# Patient Record
Sex: Male | Born: 1940 | Race: White | Hispanic: No | Marital: Married | Smoking: Former smoker
Health system: Southern US, Community
[De-identification: ages and names within clinical notes are randomized; demographics above are authoritative.]

## PROBLEM LIST (undated history)

## (undated) DIAGNOSIS — A419 Sepsis, unspecified organism: Secondary | ICD-10-CM

## (undated) DIAGNOSIS — I509 Heart failure, unspecified: Secondary | ICD-10-CM

## (undated) DIAGNOSIS — R569 Unspecified convulsions: Secondary | ICD-10-CM

## (undated) DIAGNOSIS — I10 Essential (primary) hypertension: Secondary | ICD-10-CM

## (undated) DIAGNOSIS — K219 Gastro-esophageal reflux disease without esophagitis: Secondary | ICD-10-CM

## (undated) DIAGNOSIS — F419 Anxiety disorder, unspecified: Secondary | ICD-10-CM

## (undated) DIAGNOSIS — M069 Rheumatoid arthritis, unspecified: Secondary | ICD-10-CM

## (undated) DIAGNOSIS — M359 Systemic involvement of connective tissue, unspecified: Secondary | ICD-10-CM

## (undated) DIAGNOSIS — D649 Anemia, unspecified: Secondary | ICD-10-CM

## (undated) DIAGNOSIS — T7840XA Allergy, unspecified, initial encounter: Secondary | ICD-10-CM

## (undated) DIAGNOSIS — M1990 Unspecified osteoarthritis, unspecified site: Secondary | ICD-10-CM

## (undated) DIAGNOSIS — F32A Depression, unspecified: Secondary | ICD-10-CM

## (undated) DIAGNOSIS — E119 Type 2 diabetes mellitus without complications: Secondary | ICD-10-CM

## (undated) HISTORY — PX: SPINE SURGERY: SHX786

## (undated) HISTORY — DX: Sepsis, unspecified organism: A41.9

## (undated) HISTORY — PX: JOINT REPLACEMENT: SHX530

## (undated) HISTORY — PX: BACK SURGERY: SHX140

## (undated) HISTORY — PX: HERNIA REPAIR: SHX51

## (undated) HISTORY — DX: Allergy, unspecified, initial encounter: T78.40XA

## (undated) HISTORY — PX: OTHER SURGICAL HISTORY: SHX169

## (undated) HISTORY — PX: CARPAL TUNNEL RELEASE: SHX101

---

## 2007-07-01 ENCOUNTER — Ambulatory Visit: Payer: Self-pay | Admitting: General Practice

## 2007-07-01 ENCOUNTER — Other Ambulatory Visit: Payer: Self-pay

## 2007-07-17 ENCOUNTER — Inpatient Hospital Stay: Payer: Self-pay | Admitting: General Practice

## 2007-10-09 ENCOUNTER — Ambulatory Visit: Payer: Self-pay | Admitting: General Practice

## 2007-10-10 ENCOUNTER — Ambulatory Visit: Payer: Self-pay | Admitting: General Practice

## 2007-10-24 ENCOUNTER — Inpatient Hospital Stay: Payer: Self-pay | Admitting: General Practice

## 2008-09-11 ENCOUNTER — Emergency Department: Payer: Self-pay | Admitting: Emergency Medicine

## 2010-06-14 ENCOUNTER — Ambulatory Visit: Payer: Self-pay | Admitting: Gastroenterology

## 2010-11-13 ENCOUNTER — Ambulatory Visit: Payer: Self-pay | Admitting: Surgery

## 2010-11-14 ENCOUNTER — Ambulatory Visit: Payer: Self-pay | Admitting: Surgery

## 2013-05-21 DIAGNOSIS — M549 Dorsalgia, unspecified: Secondary | ICD-10-CM | POA: Insufficient documentation

## 2013-05-21 DIAGNOSIS — I447 Left bundle-branch block, unspecified: Secondary | ICD-10-CM | POA: Insufficient documentation

## 2013-06-12 ENCOUNTER — Ambulatory Visit: Payer: Self-pay | Admitting: Medical

## 2013-06-16 ENCOUNTER — Ambulatory Visit: Payer: Self-pay | Admitting: Specialist

## 2013-11-13 DIAGNOSIS — G8929 Other chronic pain: Secondary | ICD-10-CM | POA: Insufficient documentation

## 2013-11-13 DIAGNOSIS — I10 Essential (primary) hypertension: Secondary | ICD-10-CM | POA: Insufficient documentation

## 2013-11-13 DIAGNOSIS — M5441 Lumbago with sciatica, right side: Secondary | ICD-10-CM

## 2013-11-13 DIAGNOSIS — R569 Unspecified convulsions: Secondary | ICD-10-CM | POA: Insufficient documentation

## 2013-11-13 DIAGNOSIS — N419 Inflammatory disease of prostate, unspecified: Secondary | ICD-10-CM | POA: Insufficient documentation

## 2013-11-13 DIAGNOSIS — M5442 Lumbago with sciatica, left side: Secondary | ICD-10-CM

## 2013-12-17 DIAGNOSIS — E119 Type 2 diabetes mellitus without complications: Secondary | ICD-10-CM | POA: Insufficient documentation

## 2013-12-17 DIAGNOSIS — M069 Rheumatoid arthritis, unspecified: Secondary | ICD-10-CM | POA: Insufficient documentation

## 2015-05-23 ENCOUNTER — Encounter: Payer: Self-pay | Admitting: Internal Medicine

## 2015-05-23 ENCOUNTER — Emergency Department: Payer: Medicare Other

## 2015-05-23 ENCOUNTER — Inpatient Hospital Stay
Admission: EM | Admit: 2015-05-23 | Discharge: 2015-05-26 | DRG: 872 | Disposition: A | Discharge status: Home / Self Care | Payer: Medicare Other | Attending: Internal Medicine | Admitting: Internal Medicine

## 2015-05-23 DIAGNOSIS — M069 Rheumatoid arthritis, unspecified: Secondary | ICD-10-CM | POA: Diagnosis present

## 2015-05-23 DIAGNOSIS — G40909 Epilepsy, unspecified, not intractable, without status epilepticus: Secondary | ICD-10-CM | POA: Diagnosis present

## 2015-05-23 DIAGNOSIS — A419 Sepsis, unspecified organism: Secondary | ICD-10-CM | POA: Diagnosis present

## 2015-05-23 DIAGNOSIS — Z8249 Family history of ischemic heart disease and other diseases of the circulatory system: Secondary | ICD-10-CM | POA: Diagnosis not present

## 2015-05-23 DIAGNOSIS — I10 Essential (primary) hypertension: Secondary | ICD-10-CM | POA: Diagnosis present

## 2015-05-23 DIAGNOSIS — Z79899 Other long term (current) drug therapy: Secondary | ICD-10-CM

## 2015-05-23 DIAGNOSIS — R4182 Altered mental status, unspecified: Secondary | ICD-10-CM | POA: Diagnosis present

## 2015-05-23 DIAGNOSIS — J209 Acute bronchitis, unspecified: Secondary | ICD-10-CM | POA: Diagnosis present

## 2015-05-23 DIAGNOSIS — I959 Hypotension, unspecified: Secondary | ICD-10-CM | POA: Diagnosis present

## 2015-05-23 DIAGNOSIS — K219 Gastro-esophageal reflux disease without esophagitis: Secondary | ICD-10-CM | POA: Diagnosis present

## 2015-05-23 DIAGNOSIS — Z7982 Long term (current) use of aspirin: Secondary | ICD-10-CM

## 2015-05-23 HISTORY — DX: Rheumatoid arthritis, unspecified: M06.9

## 2015-05-23 HISTORY — DX: Essential (primary) hypertension: I10

## 2015-05-23 HISTORY — DX: Unspecified osteoarthritis, unspecified site: M19.90

## 2015-05-23 HISTORY — DX: Sepsis, unspecified organism: A41.9

## 2015-05-23 HISTORY — DX: Unspecified convulsions: R56.9

## 2015-05-23 LAB — BASIC METABOLIC PANEL
Anion gap: 8 (ref 5–15)
BUN: 22 mg/dL — ABNORMAL HIGH (ref 6–20)
CHLORIDE: 94 mmol/L — AB (ref 101–111)
CO2: 28 mmol/L (ref 22–32)
Calcium: 9 mg/dL (ref 8.9–10.3)
Creatinine, Ser: 1.08 mg/dL (ref 0.61–1.24)
GFR calc Af Amer: >60 mL/min (ref >60)
GFR calc non Af Amer: >60 mL/min (ref >60)
GLUCOSE: 95 mg/dL (ref 65–99)
Potassium: 3.2 mmol/L — ABNORMAL LOW (ref 3.5–5.1)
Sodium: 130 mmol/L — ABNORMAL LOW (ref 135–145)

## 2015-05-23 LAB — URINALYSIS COMPLETE WITH MICROSCOPIC (ARMC ONLY)
BACTERIA UA: NONE SEEN
Bilirubin Urine: NEGATIVE
Glucose, UA: NEGATIVE mg/dL
HGB URINE DIPSTICK: NEGATIVE
Ketones, ur: NEGATIVE mg/dL
Nitrite: NEGATIVE
Protein, ur: NEGATIVE mg/dL
SPECIFIC GRAVITY, URINE: 1.01 (ref 1.005–1.030)
Squamous Epithelial / LPF: NONE SEEN
pH: 7 (ref 5.0–8.0)

## 2015-05-23 LAB — CBC WITH DIFFERENTIAL/PLATELET
BASOS ABS: 0 10*3/uL (ref 0–0.1)
Basophils Relative: 0 %
Eosinophils Absolute: 0 10*3/uL (ref 0–0.7)
Eosinophils Relative: 0 %
HEMATOCRIT: 37.3 % — AB (ref 40.0–52.0)
HEMOGLOBIN: 12.6 g/dL — AB (ref 13.0–18.0)
LYMPHS PCT: 1 %
Lymphs Abs: 0.2 10*3/uL — ABNORMAL LOW (ref 1.0–3.6)
MCH: 28.6 pg (ref 26.0–34.0)
MCHC: 33.7 g/dL (ref 32.0–36.0)
MCV: 84.8 fL (ref 80.0–100.0)
MONO ABS: 1 10*3/uL (ref 0.2–1.0)
Monocytes Relative: 7 %
NEUTROS ABS: 13.7 10*3/uL — AB (ref 1.4–6.5)
Neutrophils Relative %: 92 %
Platelets: 179 10*3/uL (ref 150–440)
RBC: 4.39 MIL/uL — ABNORMAL LOW (ref 4.40–5.90)
RDW: 16.2 % — AB (ref 11.5–14.5)
WBC: 14.9 10*3/uL — ABNORMAL HIGH (ref 3.8–10.6)

## 2015-05-23 LAB — LACTIC ACID, PLASMA: Lactic Acid, Venous: 1.2 mmol/L (ref 0.5–2.0)

## 2015-05-23 LAB — TROPONIN I: Troponin I: <0.03 ng/mL (ref <0.031)

## 2015-05-23 MED ORDER — SODIUM CHLORIDE 0.9 % IV BOLUS (SEPSIS)
1000.0000 mL | Freq: Once | INTRAVENOUS | Status: AC
  Administered 2015-05-23: 1000 mL via INTRAVENOUS

## 2015-05-23 MED ORDER — ACETAMINOPHEN 650 MG RE SUPP: 650.0000 mg | Freq: Four times a day (QID) | RECTAL | Status: DC | PRN

## 2015-05-23 MED ORDER — MORPHINE SULFATE (PF) 2 MG/ML IV SOLN: 2.0000 mg | INTRAVENOUS | Status: DC | PRN

## 2015-05-23 MED ORDER — DEXTROSE 5 % IV SOLN
2.0000 g | Freq: Three times a day (TID) | INTRAVENOUS | Status: DC
  Administered 2015-05-23 – 2015-05-25 (×6): 2 g via INTRAVENOUS
  Filled 2015-05-23 (×10): qty 2

## 2015-05-23 MED ORDER — ONDANSETRON HCL 4 MG/2ML IJ SOLN: 4.0000 mg | Freq: Four times a day (QID) | INTRAMUSCULAR | Status: DC | PRN

## 2015-05-23 MED ORDER — HEPARIN SODIUM (PORCINE) 5000 UNIT/ML IJ SOLN
5000.0000 [IU] | Freq: Three times a day (TID) | INTRAMUSCULAR | Status: DC
Start: 2015-05-23 — End: 2015-05-26
  Administered 2015-05-24 – 2015-05-26 (×7): 5000 [IU] via SUBCUTANEOUS
  Filled 2015-05-23 (×7): qty 1

## 2015-05-23 MED ORDER — OXYCODONE HCL 5 MG PO TABS
5.0000 mg | ORAL_TABLET | ORAL | Status: DC | PRN
  Administered 2015-05-24 (×2): 5 mg via ORAL
  Filled 2015-05-23 (×2): qty 1

## 2015-05-23 MED ORDER — SODIUM CHLORIDE 0.9 % IJ SOLN
3.0000 mL | Freq: Two times a day (BID) | INTRAMUSCULAR | Status: DC
  Administered 2015-05-24 – 2015-05-26 (×3): 3 mL via INTRAVENOUS

## 2015-05-23 MED ORDER — SODIUM CHLORIDE 0.9 % IV SOLN
INTRAVENOUS | Status: DC
  Administered 2015-05-24 – 2015-05-25 (×4): via INTRAVENOUS

## 2015-05-23 MED ORDER — PIPERACILLIN-TAZOBACTAM 3.375 G IVPB
3.3750 g | Freq: Once | INTRAVENOUS | Status: DC
  Filled 2015-05-23: qty 50

## 2015-05-23 MED ORDER — LEVOFLOXACIN IN D5W 750 MG/150ML IV SOLN
750.0000 mg | Freq: Once | INTRAVENOUS | Status: AC
  Administered 2015-05-23: 750 mg via INTRAVENOUS
  Filled 2015-05-23: qty 150

## 2015-05-23 MED ORDER — ONDANSETRON HCL 4 MG PO TABS: 4.0000 mg | ORAL_TABLET | Freq: Four times a day (QID) | ORAL | Status: DC | PRN

## 2015-05-23 MED ORDER — ACETAMINOPHEN 325 MG PO TABS
650.0000 mg | ORAL_TABLET | Freq: Four times a day (QID) | ORAL | Status: DC | PRN
  Administered 2015-05-23: 650 mg via ORAL
  Filled 2015-05-23: qty 2

## 2015-05-23 MED ORDER — VANCOMYCIN HCL IN DEXTROSE 1-5 GM/200ML-% IV SOLN
1000.0000 mg | Freq: Once | INTRAVENOUS | Status: AC
  Administered 2015-05-23: 1000 mg via INTRAVENOUS
  Filled 2015-05-23: qty 200

## 2015-05-23 NOTE — ED Provider Notes (Signed)
[facility] Emergency Department Provider Note    ____________________________________________  Time seen: 2040  I have reviewed the triage vital signs and the nursing notes.   HISTORY  Chief Complaint Altered Mental Status and Weakness   History limited by: Altered Mental Status, some history obtained from family   HPI Jeremiah [name]. is a 75 y.o. male who presents to the emergency department today because of concerns for altered mental status. Patient himself does state that he felt confused this afternoon. Family states that this morning he seemed to be in his normal state of health. His son then went shopping with him and noted a couple episodes of some odd comments. The wife states that when he got home he seemed to be quite confused. He was answering questions appropriately. She also thought he seemed quite weak and was having a hard time walking. The patient himself denies any chest pain, shortness breath or cough. Denies any abdominal pain nausea vomiting or diarrhea. Denies any dysuria or foul odor to his urine.   Past Medical History  Diagnosis Date  . Hypertension   . Arthritis   . Seizures ([place])   . Rheumatoid arthritis [facility])     Patient Active Problem List   Diagnosis Date Noted  . Sepsis ([place]) 05/23/2015    History reviewed. No pertinent past surgical history.  Current Outpatient Rx  Name  Route  Sig  Dispense  Refill  . furosemide (LASIX) 40 MG tablet   Oral   Take 40 mg by mouth daily.         Marland Kitchen gabapentin (NEURONTIN) 300 MG capsule   Oral   Take 300 mg by mouth 2 (two) times daily.         . hydrochlorothiazide (HYDRODIURIL) 25 MG tablet   Oral   Take 25 mg by mouth daily.         . hydroxychloroquine (PLAQUENIL) 200 MG tablet   Oral   Take 400 mg by mouth at bedtime.         . lamoTRIgine (LAMICTAL) 200 MG tablet   Oral   Take 200 mg by mouth 2 (two) times daily.         Marland Kitchen losartan-hydrochlorothiazide  (HYZAAR) 50-12.5 MG tablet   Oral   Take 1 tablet by mouth daily.         . methotrexate (RHEUMATREX) 2.5 MG tablet   Oral   Take 25 mg by mouth once a week.          . traZODone (DESYREL) 50 MG tablet   Oral   Take 50 mg by mouth at bedtime.           Allergies Cefoperazone; Penicillins; and Latex  Family History  Problem Relation Age of Onset  . Hypertension Other     Social History Social History  Substance Use Topics  . Smoking status: Never Smoker   . Smokeless tobacco: None  . Alcohol Use: No    Review of Systems  Constitutional: Negative for fever. Cardiovascular: Negative for chest pain. Respiratory: Negative for shortness of breath. Gastrointestinal: Negative for abdominal pain, vomiting and diarrhea. Neurological: Negative for headaches, focal weakness or numbness.  10-point ROS otherwise negative.  ____________________________________________   PHYSICAL EXAM:  VITAL SIGNS: ED Triage Vitals  Enc Vitals Group     BP 05/23/15 1945 110/77 mmHg     Pulse Rate 05/23/15 1945 116     Resp 05/23/15 1945 18     Temp  05/23/15 1945 99.2 F (37.3 C)     Temp Source 05/23/15 1945 Oral     SpO2 05/23/15 1945 99 %     Weight 05/23/15 1945 229 lb 8 oz (104.101 kg)     Height 05/23/15 1945 6\' 1"  (1.854 m)     Head Cir --      Peak Flow --      Pain Score 05/23/15 1949 0   Constitutional: Alert and oriented. Well appearing and in no distress. Eyes: Conjunctivae are normal. PERRL. Normal extraocular movements. ENT   Head: Normocephalic and atraumatic.   Nose: No congestion/rhinnorhea.   Mouth/Throat: Mucous membranes are moist.   Neck: No stridor. Hematological/Lymphatic/Immunilogical: No cervical lymphadenopathy. Cardiovascular: Normal rate, regular rhythm.  No murmurs, rubs, or gallops. Respiratory: Normal respiratory effort without tachypnea nor retractions. Breath sounds are clear and equal bilaterally. No  wheezes/rales/rhonchi. Gastrointestinal: Soft and nontender. No distention. There is no CVA tenderness. Genitourinary: Deferred Musculoskeletal: Normal range of motion in all extremities. No joint effusions.  No lower extremity tenderness nor edema. Neurologic:  Slight difficulty with speech however can give history. Moves all extremities.  Skin:  Skin is warm, dry and intact. No rash noted. Psychiatric: Mood and affect are normal. Speech and behavior are normal. Patient exhibits appropriate insight and judgment.  ____________________________________________    LABS (pertinent positives/negatives)  Trop <0.03 Na 130 K 3.2 Cr 1.08 WBC 14.9 Hgb 12.6 Lactic 1.2  ____________________________________________   EKG  None  ____________________________________________    RADIOLOGY  CT head IMPRESSION: Normal head CT for age.  CXR IMPRESSION: 1. No acute cardiopulmonary disease is radiographically apparent. 2. Thoracic spondylosis. Degenerative arthropathy of the shoulders.   ____________________________________________   PROCEDURES  Procedure(s) performed: None  Critical Care performed: Yes, see critical care note(s)  CRITICAL CARE Performed by: Nance Pear   Total critical care time: 30 minutes  Critical care time was exclusive of separately billable procedures and treating other patients.  Critical care was necessary to treat or prevent imminent or life-threatening deterioration.  Critical care was time spent personally by me on the following activities: development of treatment plan with patient and/or surrogate as well as nursing, discussions with consultants, evaluation of patient's response to treatment, examination of patient, obtaining history from patient or surrogate, ordering and performing treatments and interventions, ordering and review of laboratory studies, ordering and review of radiographic studies, pulse oximetry and re-evaluation of  patient's condition.  ____________________________________________   INITIAL IMPRESSION / ASSESSMENT AND PLAN / ED COURSE  Pertinent labs & imaging results that were available during my care of the patient were reviewed by me and considered in my medical decision making (see chart for details).  Patient presented to the emergency department today because of concerns for confusion. Patient was tachycardic and febrile when he first came. Code sepsis was called. Patient was given IV fluids and broad-spectrum antibiotics. Patient appeared to do somewhat better with fluids. Will plan on admission to the hospitalist service.  ____________________________________________   FINAL CLINICAL IMPRESSION(S) / ED DIAGNOSES  Final diagnoses:  Sepsis, due to unspecified organism ([place])  Altered mental status, unspecified altered mental status type     Nance Pear, MD 05/24/15 1400

## 2015-05-23 NOTE — H&P (Signed)
[facility] NAME: Jeremiah [name]    MR#:  [id number]  DATE OF BIRTH:  01/17/1941   DATE OF ADMISSION:  05/23/2015  PRIMARY CARE PHYSICIAN: No primary care provider on file.   REQUESTING/REFERRING PHYSICIAN: Archie Balboa  CHIEF COMPLAINT:   Chief Complaint  Patient presents with  . Altered Mental Status  . Weakness    HISTORY OF PRESENT ILLNESS:  Jeremiah [name]  is a 75 y.o. male with a known history of rheumatoid arthritis on immune modulators, essential hypertension who is presenting with altered mental status. Patient is a somewhat poor historian history of obtained from him as well as wife present at bedside. They described acute onset of shaking chills with confusion. Because of this came to the Hospital further workup and evaluatio on EMS arrival noted to be hypotensive with blood pressures in the high 70s. Denies further localizing symptoms including pain, cough   PAST MEDICAL HISTORY:   Past Medical History  Diagnosis Date  . Hypertension   . Arthritis   . Seizures ([place])   . Rheumatoid arthritis ([place])     PAST SURGICAL HISTORY:  History reviewed. No pertinent past surgical history.  SOCIAL HISTORY:   Social History  Substance Use Topics  . Smoking status: Never Smoker   . Smokeless tobacco: Not on file  . Alcohol Use: No    FAMILY HISTORY:   Family History  Problem Relation Age of Onset  . Hypertension Other     DRUG ALLERGIES:   Allergies  Allergen Reactions  . Cefoperazone Anaphylaxis  . Penicillins Anaphylaxis and Other (See Comments)    Has patient had a PCN reaction causing immediate rash, facial/tongue/throat swelling, SOB or lightheadedness with hypotension: Yes Has patient had a PCN reaction causing severe rash involving mucus membranes or skin necrosis: No Has patient had a PCN reaction that required hospitalization No Has patient had a PCN reaction occurring within the last 10 years:  No If all of the above answers are "NO", then may proceed with Cephalosporin use.  . Latex Rash    REVIEW OF SYSTEMS:  REVIEW OF SYSTEMS:  CONSTITUTIONAL: Denies fevers, positive chills, fatigue, weakness.  EYES: Denies blurred vision, double vision, or eye pain.  EARS, NOSE, THROAT: Denies tinnitus, ear pain, hearing loss.  RESPIRATORY: denies cough, shortness of breath, wheezing  CARDIOVASCULAR: Denies chest pain, palpitations, edema.  GASTROINTESTINAL: Denies nausea, vomiting, diarrhea, abdominal pain.  GENITOURINARY: Denies dysuria, hematuria.  ENDOCRINE: Denies nocturia or thyroid problems. HEMATOLOGIC AND LYMPHATIC: Denies easy bruising or bleeding.  SKIN:  small wound left heel, Denies rash or lesions.  MUSCULOSKELETAL: Denies pain in neck, back, shoulder, knees, hips, or further arthritic symptoms.  NEUROLOGIC: Denies paralysis, paresthesias.  PSYCHIATRIC: Denies anxiety or depressive symptoms. Otherwise full review of systems performed by me is negative.   MEDICATIONS AT HOME:   Prior to Admission medications   Medication Sig Start Date End Date Taking? Authorizing Provider  aspirin 81 MG chewable tablet Chew 81 mg by mouth daily.   Yes Historical Provider, MD  Cinnamon 500 MG capsule Take 500 mg by mouth 2 (two) times daily.   Yes Historical Provider, MD  diazepam (VALIUM) 5 MG tablet Take 5 mg by mouth every 6 (six) hours as needed for muscle spasms.   Yes Historical Provider, MD  etanercept (ENBREL) 50 MG/ML injection Inject 50 mg into the skin once a week. Pt uses on Friday.   Yes Historical Provider, MD  ferrous sulfate 325 (65 FE) MG tablet Take 325 mg by mouth daily with breakfast.   Yes Historical Provider, MD  folic acid (FOLVITE) Q000111Q MCG tablet Take 800 mcg by mouth daily.   Yes Historical Provider, MD  furosemide (LASIX) 40 MG tablet Take 40 mg by mouth daily.   Yes Historical Provider, MD  gabapentin (NEURONTIN) 300 MG capsule Take 300 mg by mouth 2 (two) times  daily.   Yes Historical Provider, MD  hydrochlorothiazide (HYDRODIURIL) 25 MG tablet Take 25 mg by mouth daily.   Yes Historical Provider, MD  hydroxychloroquine (PLAQUENIL) 200 MG tablet Take 400 mg by mouth at bedtime.   Yes Historical Provider, MD  lamoTRIgine (LAMICTAL) 200 MG tablet Take 200 mg by mouth 2 (two) times daily.   Yes Historical Provider, MD  losartan-hydrochlorothiazide (HYZAAR) 50-12.5 MG tablet Take 1 tablet by mouth daily.   Yes Historical Provider, MD  methotrexate (RHEUMATREX) 2.5 MG tablet Take 25 mg by mouth once a week. Pt takes on Monday.   Yes Historical Provider, MD  omeprazole (PRILOSEC) 20 MG capsule Take 40 mg by mouth daily.   Yes Historical Provider, MD  potassium chloride SA (K-DUR,KLOR-CON) 20 MEQ tablet Take 20 mEq by mouth daily.   Yes Historical Provider, MD  traZODone (DESYREL) 50 MG tablet Take 50 mg by mouth at bedtime.   Yes Historical Provider, MD      VITAL SIGNS:  Blood pressure 118/60, pulse 111, temperature 101.1 F (38.4 C), temperature source Rectal, resp. rate 19, height 6\' 1"  (1.854 m), weight 229 lb 8 oz (104.101 kg), SpO2 99 %.  PHYSICAL EXAMINATION:  VITAL SIGNS: Filed Vitals:   05/23/15 2145 05/23/15 2229  BP: 118/60   Pulse: 111   Temp:  101.1 F (38.4 C)  Resp: 35    GENERAL:74 y.o.male currently in no acute distress.  HEAD: Normocephalic, atraumatic.  EYES: Pupils equal, round, reactive to light. Extraocular muscles intact. No scleral icterus.  MOUTH: Moist mucosal membrane. Dentition intact. No abscess noted.  EAR, NOSE, THROAT: Clear without exudates. No external lesions.  NECK: Supple. No thyromegaly. No nodules. No JVD.  PULMONARY: Clear to ascultation, without wheeze rails or rhonci. No use of accessory muscles, Good respiratory effort. good air entry bilaterally CHEST: Nontender to palpation.  CARDIOVASCULAR: S1 and S2. tachycardic No murmurs, rubs, or gallops. No edema. Pedal pulses 2+ bilaterally.  GASTROINTESTINAL:  Soft, nontender, nondistended. No masses. Positive bowel sounds. No hepatosplenomegaly.  MUSCULOSKELETAL: No swelling, clubbing, or edema. Range of motion full in all extremities.  NEUROLOGIC: Cranial nerves II through XII are intact. No gross focal neurological deficits. Sensation intact. Reflexes intact.  SKIN:  left lower extremity erythema warm to touch from heel to mid calf, small linear ulceration left heel without any discharge No further  ulceration, lesions, rashes, or cyanosis. Skin warm and dry. Turgor intact.  PSYCHIATRIC: Mood, affect blunted . The patient is awake, alert and oriented x 3. Insight, judgment intact.    LABORATORY PANEL:   CBC  Recent Labs Lab 05/23/15 2000  WBC 14.9*  HGB 12.6*  HCT 37.3*  PLT 179   ------------------------------------------------------------------------------------------------------------------  Chemistries   Recent Labs Lab 05/23/15 2000  NA 130*  K 3.2*  CL 94*  CO2 28  GLUCOSE 95  BUN 22*  CREATININE 1.08  CALCIUM 9.0   ------------------------------------------------------------------------------------------------------------------  Cardiac Enzymes  Recent Labs Lab 05/23/15 2000  TROPONINI <0.03   ------------------------------------------------------------------------------------------------------------------  RADIOLOGY:  Ct Head Wo Contrast  05/23/2015  CLINICAL DATA:  Altered mental status.  Weakness.  Hypotension. EXAM: CT HEAD WITHOUT CONTRAST TECHNIQUE: Contiguous axial images were obtained from the base of the skull through the vertex without intravenous contrast. COMPARISON:  Report of 04/23/2003. FINDINGS: Sinuses/Soft tissues: Clear paranasal sinuses and mastoid air cells. Intracranial: No mass lesion, hemorrhage, hydrocephalus, acute infarct, intra-axial, or extra-axial fluid collection. IMPRESSION: Normal head CT for age. Electronically Signed   By: Abigail Miyamoto M.D.   On: 05/23/2015 20:26   Dg Chest  Portable 1 View  05/23/2015  CLINICAL DATA:  Altered mental status. Weakness. Hypotension. Slurred speech. EXAM: PORTABLE CHEST 1 VIEW COMPARISON:  Report from 05/31/2004 FINDINGS: The lungs appear clear.  Cardiac and mediastinal contours normal. No pleural effusion identified. Thoracic spondylosis.  Degenerative arthropathy of both shoulders. IMPRESSION: 1. No acute cardiopulmonary disease is radiographically apparent. 2. Thoracic spondylosis.  Degenerative arthropathy of the shoulders. Electronically Signed   By: Van Clines M.D.   On: 05/23/2015 21:54    EKG:   Orders placed or performed in visit on 07/01/07  . EKG 12-Lead    IMPRESSION AND PLAN:   75 year old Caucasian gentleman history of rheumatoid arthritis is presenting with fevers chills MENTAL status  1.Sepsis, meeting septic criteria by temperature, leukocytosis, heart rate present on arrival. Source unknown etiology  Panculture. Broad-spectrum antibiotics inclvancomycin/Levaquin/aztreonam and taper antibiotics when culture data returns.  Continue IV fluid hydration to keep mean arterial pressure greater than 65. He may require pressor therapy if blood pressure worsens. We will repeat lactic acid if the initial is greater than 2.2.   2. Essential hypertension: Given relative hypotension hold all medications 3. Seizure disorder: Lamictal 4. GERD without esophagitis: PPI therapy 5. Venous thromboembolism prophylactic: Heparin subcutaneous    All the records are reviewed and case discussed with ED provider. Management plans discussed with the patient, family and they are in agreement.  CODE STATfull   TOTAL TIME TAKING CARE OF THIS PATIENT: 45 minutes.    Merian Wroe,  Karenann Cai.D on 05/23/2015 at 10:42 PM  Between 7am to 6pm - Pager - [phone]  After 6pm: House Pager: - [phone]  [facility]  Office  [phone]  CC: Primary care physician; No primary care provider on file.

## 2015-05-23 NOTE — ED Notes (Signed)
Patient brought in by [facility] from home for complaints of AMS, weakness, and hypotension. Per EMS patient was out with his son around 5pm when patients son noticed that he was slurring his speech and was experiencing weakness. Per EMS when they arrived patients. BP was 78/42, patient was given approximately 400cc of fluid and BP came up to 105/55. CBG 110.

## 2015-05-23 NOTE — ED Notes (Signed)
Per patient wife they were at home this afternoon when she noticed that he had difficulty getting up out of his car. Patient wife also stated that patient was confused, he tried to answer the phone but was holding the phone upside down. Patient denies any recent illness or signs of infection.

## 2015-05-24 LAB — LACTIC ACID, PLASMA: Lactic Acid, Venous: 1.2 mmol/L (ref 0.5–2.0)

## 2015-05-24 MED ORDER — SODIUM CHLORIDE 0.9 % IV SOLN
1250.0000 mg | Freq: Two times a day (BID) | INTRAVENOUS | Status: DC
  Administered 2015-05-24 – 2015-05-25 (×3): 1250 mg via INTRAVENOUS
  Filled 2015-05-24 (×6): qty 1250

## 2015-05-24 MED ORDER — ASPIRIN 81 MG PO CHEW
81.0000 mg | CHEWABLE_TABLET | Freq: Every day | ORAL | Status: DC
  Administered 2015-05-24 – 2015-05-26 (×3): 81 mg via ORAL
  Filled 2015-05-24 (×3): qty 1

## 2015-05-24 MED ORDER — DIAZEPAM 5 MG PO TABS
5.0000 mg | ORAL_TABLET | Freq: Four times a day (QID) | ORAL | Status: DC | PRN
  Administered 2015-05-25: 5 mg via ORAL
  Filled 2015-05-24: qty 1

## 2015-05-24 MED ORDER — GABAPENTIN 300 MG PO CAPS
300.0000 mg | ORAL_CAPSULE | Freq: Two times a day (BID) | ORAL | Status: DC
  Administered 2015-05-24 – 2015-05-26 (×6): 300 mg via ORAL
  Filled 2015-05-24 (×6): qty 1

## 2015-05-24 MED ORDER — PANTOPRAZOLE SODIUM 40 MG PO TBEC
40.0000 mg | DELAYED_RELEASE_TABLET | Freq: Every day | ORAL | Status: DC
  Administered 2015-05-24 – 2015-05-26 (×3): 40 mg via ORAL
  Filled 2015-05-24 (×3): qty 1

## 2015-05-24 MED ORDER — FOLIC ACID 1 MG PO TABS
1000.0000 ug | ORAL_TABLET | Freq: Every day | ORAL | Status: DC
  Administered 2015-05-24 – 2015-05-26 (×3): 1 mg via ORAL
  Filled 2015-05-24 (×3): qty 1

## 2015-05-24 MED ORDER — TRAZODONE HCL 50 MG PO TABS
50.0000 mg | ORAL_TABLET | Freq: Every day | ORAL | Status: DC
  Administered 2015-05-24 – 2015-05-25 (×3): 50 mg via ORAL
  Filled 2015-05-24 (×3): qty 1

## 2015-05-24 MED ORDER — HYDROXYCHLOROQUINE SULFATE 200 MG PO TABS
400.0000 mg | ORAL_TABLET | Freq: Every day | ORAL | Status: DC
  Administered 2015-05-24 – 2015-05-25 (×3): 400 mg via ORAL
  Filled 2015-05-24 (×4): qty 2

## 2015-05-24 MED ORDER — LAMOTRIGINE 100 MG PO TABS
200.0000 mg | ORAL_TABLET | Freq: Two times a day (BID) | ORAL | Status: DC
  Administered 2015-05-24 – 2015-05-26 (×6): 200 mg via ORAL
  Filled 2015-05-24 (×6): qty 2

## 2015-05-24 MED ORDER — CINNAMON 500 MG PO CAPS: 500.0000 mg | ORAL_CAPSULE | Freq: Two times a day (BID) | ORAL | Status: DC

## 2015-05-24 MED ORDER — LEVOFLOXACIN IN D5W 750 MG/150ML IV SOLN
750.0000 mg | INTRAVENOUS | Status: DC
  Administered 2015-05-24 – 2015-05-25 (×2): 750 mg via INTRAVENOUS
  Filled 2015-05-24 (×3): qty 150

## 2015-05-24 NOTE — Progress Notes (Signed)
PHARMACIST - PHYSICIAN ORDER COMMUNICATION  CONCERNING: P&T Medication Policy on Herbal Medications  DESCRIPTION:  This patient's order for:  Cinnamon bark  has been noted.  This product(s) is classified as an "herbal" or natural product. Due to a lack of definitive safety studies or FDA approval, nonstandard manufacturing practices, plus the potential risk of unknown drug-drug interactions while on inpatient medications, the Pharmacy and Therapeutics Committee does not permit the use of "herbal" or natural products of this type within [facility].   ACTION TAKEN: The pharmacy department is unable to verify this order at this time. Please reevaluate patient's clinical condition at discharge and address if the herbal or natural product(s) should be resumed at that time.

## 2015-05-24 NOTE — Care Management Note (Signed)
Case Management Note  Patient Details  Name: Kreig Docken. MRN: [id number] Date of Birth: 10-27-1940  Subjective/Objective:          Patient admitted from home with diagnosis of sepsis.  Patient A&O and lives at home with wife.  Patient independent of ADL's  Patient obtains his medications form [facility] in [place].  Patient has 3 adult sons who live locally.  Patient states that he has a rolling walker, and BSC in the home.  Patient states that he does not have need for this equipment.  Patient states that he uses SCD's in the evening, and a walking stick for ambulation.  Patient does not have any current home health services but was open to [place] 2 years ago.  No RNCM needs identified.            Action/Plan:   Expected Discharge Date:  05/27/15               Expected Discharge Plan:     In-House Referral:     Discharge planning Services     Post Acute Care Choice:    Choice offered to:     DME Arranged:    DME Agency:     HH Arranged:    HH Agency:     Status of Service:     Medicare Important Message Given:    Date Medicare IM Given:    Medicare IM give by:    Date Additional Medicare IM Given:    Additional Medicare Important Message give by:     If discussed at [place] of Stay Meetings, dates discussed:    Additional Comments:  Beverly Sessions, RN 05/24/2015, 1:42 PM

## 2015-05-24 NOTE — Progress Notes (Signed)
ANTIBIOTIC CONSULT NOTE - INITIAL  Pharmacy Consult for Aztreonam/Levofloxacin/Vancomycin Indication: rule out sepsis  Allergies  Allergen Reactions  . Cefoperazone Anaphylaxis  . Penicillins Anaphylaxis and Other (See Comments)    Has patient had a PCN reaction causing immediate rash, facial/tongue/throat swelling, SOB or lightheadedness with hypotension: Yes Has patient had a PCN reaction causing severe rash involving mucus membranes or skin necrosis: No Has patient had a PCN reaction that required hospitalization No Has patient had a PCN reaction occurring within the last 10 years: No If all of the above answers are "NO", then may proceed with Cephalosporin use.  . Latex Rash    Patient Measurements: Height: 6\' 1"  (185.4 cm) Weight: 229 lb 8 oz (104.101 kg) IBW/kg (Calculated) : 79.9 Adjusted Body Weight: 90 kg  Vital Signs: Temp: 101.1 F (38.4 C) (01/02 2229) Temp Source: Rectal (01/02 2229) BP: 110/69 mmHg (01/02 2330) Pulse Rate: 105 (01/02 2330) Intake/Output from previous day:   Intake/Output from this shift:    Labs:  Recent Labs  05/23/15 2000  WBC 14.9*  HGB 12.6*  PLT 179  CREATININE 1.08   Estimated Creatinine Clearance: 77.2 mL/min (by C-G formula based on Cr of 1.08). No results for input(s): VANCOTROUGH, VANCOPEAK, VANCORANDOM, GENTTROUGH, GENTPEAK, GENTRANDOM, TOBRATROUGH, TOBRAPEAK, TOBRARND, AMIKACINPEAK, AMIKACINTROU, AMIKACIN in the last 72 hours.   Microbiology: No results found for this or any previous visit (from the past 720 hour(s)).  Medical History: Past Medical History  Diagnosis Date  . Hypertension   . Arthritis   . Seizures ([place])   . Rheumatoid arthritis ([facility])     Medications:  Infusions:  . sodium chloride     Assessment: 74 yom cc AMS/weakness. On immune modulators for RA. Poor historian, hypotensive in high 70s. Pharmacy consulted to dose antibiotics for sepsis. Penicillin allergy.  Vd 62.7 L, Ke 0.068 hr-1, T1/2  10.1 hr  Goal of Therapy:  Vancomycin trough level 15-20 mcg/ml  Plan:  Expected duration 7 days with resolution of temperature and/or normalization of WBC. Aztreonam 2 gm IV Q8H, levofloxacin 750 mg IV Q24H, and vancomycin 1 gm IV x 1 given in ED followed by 1250 mg IV Q12H starting approximately 6 hours after first dose, predicted trough 16 mcg/mL. Pharmacy will continue to follow and adjust as needed to maintain trough 15 to 20 mcg/mL.  Laural Benes, Pharm.D., BCPS Clinical Pharmacist 05/24/2015,12:10 AM

## 2015-05-24 NOTE — Plan of Care (Signed)
Problem: Fluid Volume: Goal: Hemodynamic stability will improve Outcome: Progressing Requiring iv fluids  Problem: Respiratory: Goal: Ability to maintain adequate ventilation will improve Outcome: Progressing No need for oxygen at this time, remaining on room air.

## 2015-05-24 NOTE — Progress Notes (Signed)
Patient alert and oriented, on RA  BP running low, but Dr. Margaretmary Eddy states this is ok, continue to monitor -- may be d/t pain meds.  Giving oxycodone for pain in back.

## 2015-05-24 NOTE — Progress Notes (Signed)
[place] [facility] [place] NAME: Jeremiah [name]    MR#:  [id number]  DATE OF BIRTH:  [birth year]  SUBJECTIVE:  CHIEF COMPLAINT:  Patient is resting comfortably. No overnight events. No new complaints.  REVIEW OF SYSTEMS:  CONSTITUTIONAL: No fever, fatigue or weakness.  EYES: No blurred or double vision.  EARS, NOSE, AND THROAT: No tinnitus or ear pain.  RESPIRATORY: No cough, shortness of breath, wheezing or hemoptysis.  CARDIOVASCULAR: No chest pain, orthopnea, edema.  GASTROINTESTINAL: No nausea, vomiting, diarrhea or abdominal pain.  GENITOURINARY: No dysuria, hematuria.  ENDOCRINE: No polyuria, nocturia,  HEMATOLOGY: No anemia, easy bruising or bleeding SKIN: No rash or lesion. MUSCULOSKELETAL: No joint pain or arthritis.   NEUROLOGIC: No tingling, numbness, weakness.  PSYCHIATRY: No anxiety or depression.   DRUG ALLERGIES:   Allergies  Allergen Reactions  . Cefoperazone Anaphylaxis  . Penicillins Anaphylaxis and Other (See Comments)    Has patient had a PCN reaction causing immediate rash, facial/tongue/throat swelling, SOB or lightheadedness with hypotension: Yes Has patient had a PCN reaction causing severe rash involving mucus membranes or skin necrosis: No Has patient had a PCN reaction that required hospitalization No Has patient had a PCN reaction occurring within the last 10 years: No If all of the above answers are "NO", then may proceed with Cephalosporin use.  . Latex Rash    VITALS:  Blood pressure 104/58, pulse 80, temperature 98.6 F (37 C), temperature source Oral, resp. rate 18, height 6\' 1"  (1.854 m), weight 104.101 kg (229 lb 8 oz), SpO2 99 %.  PHYSICAL EXAMINATION:  GENERAL:  75 y.o.-year-old patient lying in the bed with no acute distress.  EYES: Pupils equal, round, reactive to light and accommodation. No scleral icterus. Extraocular muscles intact.  HEENT: Head atraumatic, normocephalic. Oropharynx and  nasopharynx clear.  NECK:  Supple, no jugular venous distention. No thyroid enlargement, no tenderness.  LUNGS: Normal breath sounds bilaterally, no wheezing, rales,rhonchi or crepitation. No use of accessory muscles of respiration.  CARDIOVASCULAR: S1, S2 normal. No murmurs, rubs, or gallops.  ABDOMEN: Soft, nontender, nondistended. Bowel sounds present. No organomegaly or mass.  EXTREMITIES: No pedal edema, cyanosis, or clubbing.  NEUROLOGIC: Cranial nerves II through XII are intact. Muscle strength 5/5 in all extremities. Sensation intact. Gait not checked.  PSYCHIATRIC: The patient is alert and oriented x 3.  SKIN: No obvious rash, lesion, or ulcer.    LABORATORY PANEL:   CBC  Recent Labs Lab 05/23/15 2000  WBC 14.9*  HGB 12.6*  HCT 37.3*  PLT 179   ------------------------------------------------------------------------------------------------------------------  Chemistries   Recent Labs Lab 05/23/15 2000  NA 130*  K 3.2*  CL 94*  CO2 28  GLUCOSE 95  BUN 22*  CREATININE 1.08  CALCIUM 9.0   ------------------------------------------------------------------------------------------------------------------  Cardiac Enzymes  Recent Labs Lab 05/23/15 2000  TROPONINI <0.03   ------------------------------------------------------------------------------------------------------------------  RADIOLOGY:  Ct Head Wo Contrast  05/23/2015  CLINICAL DATA:  Altered mental status.  Weakness.  Hypotension. EXAM: CT HEAD WITHOUT CONTRAST TECHNIQUE: Contiguous axial images were obtained from the base of the skull through the vertex without intravenous contrast. COMPARISON:  Report of 04/23/2003. FINDINGS: Sinuses/Soft tissues: Clear paranasal sinuses and mastoid air cells. Intracranial: No mass lesion, hemorrhage, hydrocephalus, acute infarct, intra-axial, or extra-axial fluid collection. IMPRESSION: Normal head CT for age. Electronically Signed   By: Abigail Miyamoto M.D.   On:  05/23/2015 20:26   Dg Chest Portable 1 View  05/23/2015  CLINICAL DATA:  Altered mental status. Weakness. Hypotension. Slurred speech. EXAM: PORTABLE CHEST 1 VIEW COMPARISON:  Report from 05/31/2004 FINDINGS: The lungs appear clear.  Cardiac and mediastinal contours normal. No pleural effusion identified. Thoracic spondylosis.  Degenerative arthropathy of both shoulders. IMPRESSION: 1. No acute cardiopulmonary disease is radiographically apparent. 2. Thoracic spondylosis.  Degenerative arthropathy of the shoulders. Electronically Signed   By: Van Clines M.D.   On: 05/23/2015 21:54    EKG:   Orders placed or performed in visit on 07/01/07  . EKG 12-Lead    ASSESSMENT AND PLAN:   75 year old Caucasian gentleman history of rheumatoid arthritis is presenting with fevers chills MENTAL status  1.Sepsis, meeting septic criteria by temperature, leukocytosis, heart rate present on arrival. Source unknown etiology Panculture done, blood cultures are negative so far, urine cultures are pending. Chest x-rays negative Continue Broad-spectrum antibiotics incl vancomycin/Levaquin/aztreonam and taper antibiotics when culture data returns. Continue IV fluid hydration to keep mean arterial pressure greater than 65. We will repeat lactic acid if the initial is greater than 2.2. -Yesterday's and today's lactic acid level is at 1.2  2. Essential hypertension: Given relative hypotension hold all medications  3. Seizure disorder: Lamictal  4. GERD without esophagitis: PPI therapy  5. Venous thromboembolism prophylactic: Heparin subcutaneous      All the records are reviewed and case discussed with Care Management/Social Workerr. Management plans discussed with the patient, family and they are in agreement.  CODE STATUS: fc   TOTAL TIME TAKING CARE OF THIS PATIENT: 35  minutes.   POSSIBLE D/C IN 2-3  DAYS, DEPENDING ON CLINICAL CONDITION.   Nicholes Mango M.D on 05/24/2015 at 3:47 PM  Between  7am to 6pm - Pager - [phone] After 6pm go to [email or web address] - password [email or web address] [facility]  [facility]  Office  [phone]  CC: Primary care physician; No primary care provider on file.

## 2015-05-25 LAB — VANCOMYCIN, TROUGH: Vancomycin Tr: 11 ug/mL (ref 10–20)

## 2015-05-25 LAB — URINE CULTURE: SPECIAL REQUESTS: NORMAL

## 2015-05-25 LAB — BASIC METABOLIC PANEL
Anion gap: 4 — ABNORMAL LOW (ref 5–15)
BUN: 13 mg/dL (ref 6–20)
CALCIUM: 8.2 mg/dL — AB (ref 8.9–10.3)
CO2: 27 mmol/L (ref 22–32)
CREATININE: 0.76 mg/dL (ref 0.61–1.24)
Chloride: 107 mmol/L (ref 101–111)
GFR calc Af Amer: >60 mL/min (ref >60)
GFR calc non Af Amer: >60 mL/min (ref >60)
GLUCOSE: 97 mg/dL (ref 65–99)
Potassium: 3.2 mmol/L — ABNORMAL LOW (ref 3.5–5.1)
Sodium: 138 mmol/L (ref 135–145)

## 2015-05-25 LAB — CBC
HCT: 31.3 % — ABNORMAL LOW (ref 40.0–52.0)
HEMOGLOBIN: 10.6 g/dL — AB (ref 13.0–18.0)
MCH: 28.9 pg (ref 26.0–34.0)
MCHC: 34 g/dL (ref 32.0–36.0)
MCV: 84.9 fL (ref 80.0–100.0)
PLATELETS: 147 10*3/uL — AB (ref 150–440)
RBC: 3.68 MIL/uL — ABNORMAL LOW (ref 4.40–5.90)
RDW: 16.4 % — ABNORMAL HIGH (ref 11.5–14.5)
WBC: 5.8 10*3/uL (ref 3.8–10.6)

## 2015-05-25 LAB — MAGNESIUM: MAGNESIUM: 1.9 mg/dL (ref 1.7–2.4)

## 2015-05-25 MED ORDER — POLYETHYLENE GLYCOL 3350 17 G PO PACK
17.0000 g | PACK | Freq: Every day | ORAL | Status: DC
  Administered 2015-05-25 – 2015-05-26 (×2): 17 g via ORAL
  Filled 2015-05-25 (×2): qty 1

## 2015-05-25 NOTE — Progress Notes (Signed)
Pt requesting laxative. Dr Leslye Peer notified and willl place order

## 2015-05-25 NOTE — Care Management Important Message (Signed)
Important Message  Patient Details  Name: Jeremiah [name]. MRN: [id number] Date of Birth: Jul 24, 1940   Medicare Important Message Given:  Yes    Juliann Pulse A Nitish Roes 05/25/2015, 10:12 AM

## 2015-05-25 NOTE — Progress Notes (Signed)
[place] at [place] NAME: Jeremiah [name]    MR#:  [id number]  DATE OF BIRTH:  Aug 31, 1940  SUBJECTIVE:  CHIEF COMPLAINT:  Patient is resting comfortably. No overnight events. Productive cough   REVIEW OF SYSTEMS:  CONSTITUTIONAL: No fever, fatigue or weakness.  EYES: No blurred or double vision.  EARS, NOSE, AND THROAT: No tinnitus or ear pain.  RESPIRATORY: Reports  Cough, no  shortness of breath, wheezing or hemoptysis.  CARDIOVASCULAR: No chest pain, orthopnea, edema.  GASTROINTESTINAL: No nausea, vomiting, diarrhea or abdominal pain.  GENITOURINARY: No dysuria, hematuria.  ENDOCRINE: No polyuria, nocturia,  HEMATOLOGY: No anemia, easy bruising or bleeding SKIN: No rash or lesion. MUSCULOSKELETAL: No joint pain or arthritis.   NEUROLOGIC: No tingling, numbness, weakness.  PSYCHIATRY: No anxiety or depression.   DRUG ALLERGIES:   Allergies  Allergen Reactions  . Cefoperazone Anaphylaxis  . Penicillins Anaphylaxis and Other (See Comments)    Has patient had a PCN reaction causing immediate rash, facial/tongue/throat swelling, SOB or lightheadedness with hypotension: Yes Has patient had a PCN reaction causing severe rash involving mucus membranes or skin necrosis: No Has patient had a PCN reaction that required hospitalization No Has patient had a PCN reaction occurring within the last 10 years: No If all of the above answers are "NO", then may proceed with Cephalosporin use.  . Latex Rash    VITALS:  Blood pressure 104/55, pulse 70, temperature 98 F (36.7 C), temperature source Oral, resp. rate 17, height 6\' 1"  (1.854 m), weight 104.101 kg (229 lb 8 oz), SpO2 98 %.  PHYSICAL EXAMINATION:  GENERAL:  75 y.o.-year-old patient lying in the bed with no acute distress.  EYES: Pupils equal, round, reactive to light and accommodation. No scleral icterus. Extraocular muscles intact.  HEENT: Head atraumatic, normocephalic.  Oropharynx and nasopharynx clear.  NECK:  Supple, no jugular venous distention. No thyroid enlargement, no tenderness.  LUNGS: Bronchial breath sounds bilaterally, no wheezing, rales,rhonchi or crepitation. No use of accessory muscles of respiration.  CARDIOVASCULAR: S1, S2 normal. No murmurs, rubs, or gallops.  ABDOMEN: Soft, nontender, nondistended. Bowel sounds present. No organomegaly or mass.  EXTREMITIES: No pedal edema, cyanosis, or clubbing.  NEUROLOGIC: Cranial nerves II through XII are intact. Muscle strength 5/5 in all extremities. Sensation intact. Gait not checked.  PSYCHIATRIC: The patient is alert and oriented x 3.  SKIN: No obvious rash, lesion, or ulcer.    LABORATORY PANEL:   CBC  Recent Labs Lab 05/25/15 0628  WBC 5.8  HGB 10.6*  HCT 31.3*  PLT 147*   ------------------------------------------------------------------------------------------------------------------  Chemistries   Recent Labs Lab 05/25/15 0628  NA 138  K 3.2*  CL 107  CO2 27  GLUCOSE 97  BUN 13  CREATININE 0.76  CALCIUM 8.2*  MG 1.9   ------------------------------------------------------------------------------------------------------------------  Cardiac Enzymes  Recent Labs Lab 05/23/15 2000  TROPONINI <0.03   ------------------------------------------------------------------------------------------------------------------  RADIOLOGY:  Ct Head Wo Contrast  05/23/2015  CLINICAL DATA:  Altered mental status.  Weakness.  Hypotension. EXAM: CT HEAD WITHOUT CONTRAST TECHNIQUE: Contiguous axial images were obtained from the base of the skull through the vertex without intravenous contrast. COMPARISON:  Report of 04/23/2003. FINDINGS: Sinuses/Soft tissues: Clear paranasal sinuses and mastoid air cells. Intracranial: No mass lesion, hemorrhage, hydrocephalus, acute infarct, intra-axial, or extra-axial fluid collection. IMPRESSION: Normal head CT for age. Electronically Signed   By: Abigail Miyamoto M.D.   On: 05/23/2015 20:26   Dg Chest Portable 1  View  05/23/2015  CLINICAL DATA:  Altered mental status. Weakness. Hypotension. Slurred speech. EXAM: PORTABLE CHEST 1 VIEW COMPARISON:  Report from 05/31/2004 FINDINGS: The lungs appear clear.  Cardiac and mediastinal contours normal. No pleural effusion identified. Thoracic spondylosis.  Degenerative arthropathy of both shoulders. IMPRESSION: 1. No acute cardiopulmonary disease is radiographically apparent. 2. Thoracic spondylosis.  Degenerative arthropathy of the shoulders. Electronically Signed   By: Van Clines M.D.   On: 05/23/2015 21:54    EKG:   Orders placed or performed in visit on 07/01/07  . EKG 12-Lead    ASSESSMENT AND PLAN:   75 year old Caucasian gentleman history of rheumatoid arthritis is presenting with fevers chills MENTAL status  1.Sepsis, meeting septic criteria by temperature, leukocytosis, heart rate present on arrival. Probably 2/2 a bronchitis  Blood cultures are negative so far, urine cultures with multiple bacteria prob contaminant   Chest x-rays negative Given Broad-spectrum antibiotics incl vancomycin/Levaquin/aztreonam and will  taper antibiotics to levaquin  Pt is clinically improving , afebrile and no leukocytosis  Discontinue IV fluid hydration and monitor BP  We will repeat lactic acid if the initial is greater than 2.2. -Yesterday's and today's lactic acid level is at 1.2  2. Essential hypertension: Given relative hypotension hold all medications  3. Seizure disorder: Lamictal  4. GERD without esophagitis: PPI therapy  5. bl le edema - - lasix on hold 2/2 soft HTN   6. Venous thromboembolism prophylactic: Heparin subcutaneous      All the records are reviewed and case discussed with Care Management/Social Workerr. Management plans discussed with the patient, family and they are in agreement.  CODE STATUS: fc   TOTAL TIME TAKING CARE OF THIS PATIENT: 35  minutes.   POSSIBLE  D/C IN 2-3  DAYS, DEPENDING ON CLINICAL CONDITION.   Nicholes Mango M.D on 05/25/2015 at 7:39 PM  Between 7am to 6pm - Pager - [phone] After 6pm go to [email or web address] - password [email or web address] [facility]  [facility]  Office  [phone]  CC: Primary care physician; No primary care provider on file.

## 2015-05-25 NOTE — Progress Notes (Signed)
Pharmacy Antibiotic Follow-up Note  Jeremiah Lana. is a 75 y.o. year-old male admitted on 05/23/2015.  The patient is currently on day 3 of Vancomycin, Azactam, and Levaquin for sepsis.  1/4 Vancomycin trough of 36mcg/ml. Looks like a dose may have been missed, will check another trough level on 1/5 to ensure we are getting a true steady state value.  Assessment/Plan: This patient's current antibiotics will be continued without adjustments.  Temp (24hrs), Avg:97.9 F (36.6 C), Min:97.6 F (36.4 C), Max:98 F (36.7 C)   Recent Labs Lab 05/23/15 2000 05/25/15 0628  WBC 14.9* 5.8    Recent Labs Lab 05/23/15 2000 05/25/15 0628  CREATININE 1.08 0.76   Estimated Creatinine Clearance: 104.2 mL/min (by C-G formula based on Cr of 0.76).    Allergies  Allergen Reactions  . Cefoperazone Anaphylaxis  . Penicillins Anaphylaxis and Other (See Comments)    Has patient had a PCN reaction causing immediate rash, facial/tongue/throat swelling, SOB or lightheadedness with hypotension: Yes Has patient had a PCN reaction causing severe rash involving mucus membranes or skin necrosis: No Has patient had a PCN reaction that required hospitalization No Has patient had a PCN reaction occurring within the last 10 years: No If all of the above answers are "NO", then may proceed with Cephalosporin use.  . Latex Rash     Thank you for allowing pharmacy to be a part of this patient's care.  Paulina Fusi, PharmD, BCPS 05/25/2015 5:21 PM

## 2015-05-26 LAB — BASIC METABOLIC PANEL
Anion gap: 3 — ABNORMAL LOW (ref 5–15)
BUN: 8 mg/dL (ref 6–20)
CO2: 28 mmol/L (ref 22–32)
Calcium: 8.4 mg/dL — ABNORMAL LOW (ref 8.9–10.3)
Chloride: 107 mmol/L (ref 101–111)
Creatinine, Ser: 0.73 mg/dL (ref 0.61–1.24)
GFR calc Af Amer: >60 mL/min (ref >60)
GLUCOSE: 94 mg/dL (ref 65–99)
POTASSIUM: 3.2 mmol/L — AB (ref 3.5–5.1)
Sodium: 138 mmol/L (ref 135–145)

## 2015-05-26 MED ORDER — HYDROCHLOROTHIAZIDE 25 MG PO TABS: 25.0000 mg | ORAL_TABLET | Freq: Every day | ORAL | Status: DC

## 2015-05-26 MED ORDER — POTASSIUM CHLORIDE 20 MEQ PO PACK: 40.0000 meq | PACK | Freq: Once | ORAL | Status: DC

## 2015-05-26 MED ORDER — LEVOFLOXACIN 500 MG PO TABS: 500.0000 mg | ORAL_TABLET | Freq: Every day | ORAL | Status: AC

## 2015-05-26 NOTE — Discharge Instructions (Signed)
Activity as tolerated Diet low-salt Follow-up with primary care physician in a week Resume taking hydrochlorothiazide 25 mg from 05/29/2015

## 2015-05-26 NOTE — Discharge Planning (Addendum)
Pt IV removed.  DC papers, given, explained and educated.  RN assessment revealed stability for DC to home. Given  scripts and no need for FU appts suggested at this time.  Pt will be wheeled to front and family driving home via car once ready.

## 2015-05-26 NOTE — Discharge Summary (Signed)
[place] [facility] [place] NAME: Jeremiah [name]    MR#:  [id number]  DATE OF BIRTH:  02/01/1941  DATE OF ADMISSION:  05/23/2015 ADMITTING PHYSICIAN: Lytle Butte, MD  DATE OF DISCHARGE: 05/26/2015  PRIMARY CARE PHYSICIAN: No primary care provider on file.    ADMISSION DIAGNOSIS:  Sepsis, due to unspecified organism ([place]) [A41.9] Altered mental status, unspecified altered mental status type [R41.82]  DISCHARGE DIAGNOSIS:  Active Problems:   Sepsis ([place])   SECONDARY DIAGNOSIS:   Past Medical History  Diagnosis Date  . Hypertension   . Arthritis   . Seizures ([place])   . Rheumatoid arthritis [facility])     HOSPITAL COURSE:  75 year old Caucasian gentleman history of rheumatoid arthritis is presenting with fevers chills MENTAL status  1.Sepsis, meeting septic criteria by temperature, leukocytosis, heart rate present on arrival. Probably 2/2 a bronchitis Blood cultures are negative so far, urine cultures with multiple bacteria prob contaminant Chest x-rays negative Given Broad-spectrum antibiotics inclvancomycin/Levaquin/aztreonam and will taper antibiotics to levaquin at d/c  Pt is clinically improving , afebrile and no leukocytosis  Discontinued IV fluids as bp is better  2. Essential hypertension: Given relative hypotension held all medications during hospital course. Resumed them at d/c  Except HCTZ.  Recommended pt to resume taking hydrochlorothiazide 25 mg from 05/29/2015  3. Seizure disorder: Lamictal  4. GERD without esophagitis: PPI therapy  5. bl le edema - - lasix on hold 2/2 soft HTN   6. Venous thromboembolism prophylactic: Heparin subcutaneous       DISCHARGE CONDITIONS:   fair  CONSULTS OBTAINED:  Treatment Team:  Lytle Butte, MD   PROCEDURES  none  DRUG ALLERGIES:   Allergies  Allergen Reactions  . Cefoperazone Anaphylaxis  . Penicillins Anaphylaxis and Other (See Comments)    Has patient had a  PCN reaction causing immediate rash, facial/tongue/throat swelling, SOB or lightheadedness with hypotension: Yes Has patient had a PCN reaction causing severe rash involving mucus membranes or skin necrosis: No Has patient had a PCN reaction that required hospitalization No Has patient had a PCN reaction occurring within the last 10 years: No If all of the above answers are "NO", then may proceed with Cephalosporin use.  . Latex Rash    DISCHARGE MEDICATIONS:   Current Discharge Medication List    START taking these medications   Details  levofloxacin (LEVAQUIN) 500 MG tablet Take 1 tablet (500 mg total) by mouth daily. Qty: 5 tablet, Refills: 0      CONTINUE these medications which have CHANGED   Details  hydrochlorothiazide (HYDRODIURIL) 25 MG tablet Take 1 tablet (25 mg total) by mouth daily. Qty: 30 tablet, Refills: 0      CONTINUE these medications which have NOT CHANGED   Details  aspirin 81 MG chewable tablet Chew 81 mg by mouth daily.    Cinnamon 500 MG capsule Take 500 mg by mouth 2 (two) times daily.    diazepam (VALIUM) 5 MG tablet Take 5 mg by mouth every 6 (six) hours as needed for muscle spasms.    etanercept (ENBREL) 50 MG/ML injection Inject 50 mg into the skin once a week. Pt uses on Friday.    ferrous sulfate 325 (65 FE) MG tablet Take 325 mg by mouth daily with breakfast.    folic acid (FOLVITE) Q000111Q MCG tablet Take 800 mcg by mouth daily.    furosemide (LASIX) 40 MG tablet Take 40 mg by mouth daily.  gabapentin (NEURONTIN) 300 MG capsule Take 300 mg by mouth 2 (two) times daily.    hydroxychloroquine (PLAQUENIL) 200 MG tablet Take 400 mg by mouth at bedtime.    lamoTRIgine (LAMICTAL) 200 MG tablet Take 200 mg by mouth 2 (two) times daily.    losartan-hydrochlorothiazide (HYZAAR) 50-12.5 MG tablet Take 1 tablet by mouth daily.    methotrexate (RHEUMATREX) 2.5 MG tablet Take 25 mg by mouth once a week. Pt takes on Monday.    omeprazole (PRILOSEC)  20 MG capsule Take 40 mg by mouth daily.    potassium chloride SA (K-DUR,KLOR-CON) 20 MEQ tablet Take 20 mEq by mouth daily.    traZODone (DESYREL) 50 MG tablet Take 50 mg by mouth at bedtime.         DISCHARGE INSTRUCTIONS:  Activity as tolerated Diet low-salt Follow-up with primary care physician in a week Resume taking hydrochlorothiazide 25 mg from 05/29/2015    DIET:  Low salt  DISCHARGE CONDITION:  Fair  ACTIVITY:  As tolerated  OXYGEN:  Home Oxygen: no    Oxygen Delivery: none  DISCHARGE LOCATION:  home  If you experience worsening of your admission symptoms, develop shortness of breath, life threatening emergency, suicidal or homicidal thoughts you must seek medical attention immediately by calling 911 or calling your MD immediately  if symptoms less severe.  You Must read complete instructions/literature along with all the possible adverse reactions/side effects for all the Medicines you take and that have been prescribed to you. Take any new Medicines after you have completely understood and accpet all the possible adverse reactions/side effects.   Please note  You were cared for by a hospitalist during your hospital stay. If you have any questions about your discharge medications or the care you received while you were in the hospital after you are discharged, you can call the unit and asked to speak with the hospitalist on call if the hospitalist that took care of you is not available. Once you are discharged, your primary care physician will handle any further medical issues. Please note that NO REFILLS for any discharge medications will be authorized once you are discharged, as it is imperative that you return to your primary care physician (or establish a relationship with a primary care physician if you do not have one) for your aftercare needs so that they can reassess your need for medications and monitor your lab values.     Today  Chief Complaint   Patient presents with  . Altered Mental Status  . Weakness   Pt is doing fine except for some cough intermittently   ROS:  CONSTITUTIONAL: Denies fevers, chills. Denies any fatigue, weakness.  EYES: Denies blurry vision, double vision, eye pain. EARS, NOSE, THROAT: Denies tinnitus, ear pain, hearing loss. RESPIRATORY: Reports  Cough intermittently, denies  wheeze, shortness of breath.  CARDIOVASCULAR: Denies chest pain, palpitations, edema.  GASTROINTESTINAL: Denies nausea, vomiting, diarrhea, abdominal pain. Denies bright red blood per rectum. GENITOURINARY: Denies dysuria, hematuria. ENDOCRINE: Denies nocturia or thyroid problems. HEMATOLOGIC AND LYMPHATIC: Denies easy bruising or bleeding. SKIN: Denies rash or lesion. MUSCULOSKELETAL: Denies pain in neck, back, shoulder, knees, hips or arthritic symptoms.  NEUROLOGIC: Denies paralysis, paresthesias.  PSYCHIATRIC: Denies anxiety or depressive symptoms.   VITAL SIGNS:  Blood pressure 121/68, pulse 66, temperature 97.5 F (36.4 C), temperature source Oral, resp. rate 16, height 6\' 1"  (1.854 m), weight 104.101 kg (229 lb 8 oz), SpO2 100 %.  I/O:   Intake/Output Summary (Last 24 hours)  at 05/26/15 1333 Last data filed at 05/26/15 0955  Gross per 24 hour  Intake    363 ml  Output      0 ml  Net    363 ml    PHYSICAL EXAMINATION:  GENERAL:  75 y.o.-year-old patient lying in the bed with no acute distress.  EYES: Pupils equal, round, reactive to light and accommodation. No scleral icterus. Extraocular muscles intact.  HEENT: Head atraumatic, normocephalic. Oropharynx and nasopharynx clear.  NECK:  Supple, no jugular venous distention. No thyroid enlargement, no tenderness.  LUNGS: Nml  breath sounds bilaterally, no wheezing, rales,rhonchi or crepitation. No use of accessory muscles of respiration.  CARDIOVASCULAR: S1, S2 normal. No murmurs, rubs, or gallops.  ABDOMEN: Soft, non-tender, non-distended. Bowel sounds present. No  organomegaly or mass.  EXTREMITIES: No pedal edema, cyanosis, or clubbing.  NEUROLOGIC: Cranial nerves II through XII are intact. Muscle strength 5/5 in all extremities. Sensation intact. Gait not checked.  PSYCHIATRIC: The patient is alert and oriented x 3.  SKIN: No obvious rash, lesion, or ulcer.   DATA REVIEW:   CBC  Recent Labs Lab 05/25/15 0628  WBC 5.8  HGB 10.6*  HCT 31.3*  PLT 147*    Chemistries   Recent Labs Lab 05/25/15 0628 05/26/15 0611  NA 138 138  K 3.2* 3.2*  CL 107 107  CO2 27 28  GLUCOSE 97 94  BUN 13 8  CREATININE 0.76 0.73  CALCIUM 8.2* 8.4*  MG 1.9  --     Cardiac Enzymes  Recent Labs Lab 05/23/15 2000  TROPONINI <0.03    Microbiology Results  Results for orders placed or performed during the hospital encounter of 05/23/15  Urine culture     Status: None   Collection Time: 05/23/15  8:41 PM  Result Value Ref Range Status   Specimen Description URINE, RANDOM  Final   Special Requests Levofloxacin and aztreonam Normal  Final   Culture MULTIPLE SPECIES PRESENT, SUGGEST RECOLLECTION  Final   Report Status 05/25/2015 FINAL  Final  Blood culture (routine x 2)     Status: None (Preliminary result)   Collection Time: 05/23/15  8:42 PM  Result Value Ref Range Status   Specimen Description BLOOD LEFT ARM  Final   Special Requests BOTTLES DRAWN AEROBIC AND ANAEROBIC 7ML  Final   Culture NO GROWTH < 24 HOURS  Final   Report Status PENDING  Incomplete  Blood culture (routine x 2)     Status: None (Preliminary result)   Collection Time: 05/23/15  8:47 PM  Result Value Ref Range Status   Specimen Description BLOOD RIGHT ARM  Final   Special Requests BOTTLES DRAWN AEROBIC AND ANAEROBIC 7ML  Final   Culture NO GROWTH < 24 HOURS  Final   Report Status PENDING  Incomplete    RADIOLOGY:  Ct Head Wo Contrast  05/23/2015  CLINICAL DATA:  Altered mental status.  Weakness.  Hypotension. EXAM: CT HEAD WITHOUT CONTRAST TECHNIQUE: Contiguous axial  images were obtained from the base of the skull through the vertex without intravenous contrast. COMPARISON:  Report of 04/23/2003. FINDINGS: Sinuses/Soft tissues: Clear paranasal sinuses and mastoid air cells. Intracranial: No mass lesion, hemorrhage, hydrocephalus, acute infarct, intra-axial, or extra-axial fluid collection. IMPRESSION: Normal head CT for age. Electronically Signed   By: Abigail Miyamoto M.D.   On: 05/23/2015 20:26   Dg Chest Portable 1 View  05/23/2015  CLINICAL DATA:  Altered mental status. Weakness. Hypotension. Slurred speech. EXAM: PORTABLE CHEST 1 VIEW  COMPARISON:  Report from 05/31/2004 FINDINGS: The lungs appear clear.  Cardiac and mediastinal contours normal. No pleural effusion identified. Thoracic spondylosis.  Degenerative arthropathy of both shoulders. IMPRESSION: 1. No acute cardiopulmonary disease is radiographically apparent. 2. Thoracic spondylosis.  Degenerative arthropathy of the shoulders. Electronically Signed   By: Van Clines M.D.   On: 05/23/2015 21:54    EKG:   Orders placed or performed in visit on 07/01/07  . EKG 12-Lead      Management plans discussed with the patient, family and they are in agreement.  CODE STATUS:     Code Status Orders        Start     Ordered   05/23/15 2222  Full code   Continuous     05/23/15 2221      TOTAL TIME TAKING CARE OF THIS PATIENT: 45  minutes.    @[facility] @  on 05/26/2015 at 1:33 PM  Between 7am to 6pm - Pager - [phone]  After 6pm go to [email or web address] - password [email or web address] [facility]  [place] [facility]  Office  [phone]  CC: Primary care physician; No primary care provider on file.

## 2015-05-28 LAB — CULTURE, BLOOD (ROUTINE X 2)
Culture: NO GROWTH
Culture: NO GROWTH

## 2016-04-20 HISTORY — PX: OTHER SURGICAL HISTORY: SHX169

## 2017-01-14 ENCOUNTER — Encounter: Payer: Self-pay | Admitting: Nurse Practitioner

## 2017-01-14 ENCOUNTER — Ambulatory Visit: Payer: Worker's Compensation | Attending: Pain Medicine | Admitting: Nurse Practitioner

## 2017-01-14 VITALS — BP 140/75 | HR 93 | Temp 98.0°F | Resp 16 | Ht 72.0 in | Wt 225.0 lb

## 2017-01-14 DIAGNOSIS — D62 Acute posthemorrhagic anemia: Secondary | ICD-10-CM | POA: Insufficient documentation

## 2017-01-14 DIAGNOSIS — Z87891 Personal history of nicotine dependence: Secondary | ICD-10-CM | POA: Diagnosis not present

## 2017-01-14 DIAGNOSIS — G8929 Other chronic pain: Secondary | ICD-10-CM | POA: Insufficient documentation

## 2017-01-14 DIAGNOSIS — Z79891 Long term (current) use of opiate analgesic: Secondary | ICD-10-CM | POA: Diagnosis not present

## 2017-01-14 DIAGNOSIS — Z9889 Other specified postprocedural states: Secondary | ICD-10-CM | POA: Diagnosis not present

## 2017-01-14 DIAGNOSIS — Z79899 Other long term (current) drug therapy: Secondary | ICD-10-CM | POA: Insufficient documentation

## 2017-01-14 DIAGNOSIS — M79605 Pain in left leg: Secondary | ICD-10-CM | POA: Diagnosis present

## 2017-01-14 DIAGNOSIS — M79604 Pain in right leg: Secondary | ICD-10-CM | POA: Insufficient documentation

## 2017-01-14 DIAGNOSIS — Z7982 Long term (current) use of aspirin: Secondary | ICD-10-CM | POA: Diagnosis not present

## 2017-01-14 DIAGNOSIS — G894 Chronic pain syndrome: Secondary | ICD-10-CM | POA: Diagnosis not present

## 2017-01-14 DIAGNOSIS — M5416 Radiculopathy, lumbar region: Secondary | ICD-10-CM | POA: Insufficient documentation

## 2017-01-14 DIAGNOSIS — M545 Low back pain: Secondary | ICD-10-CM | POA: Insufficient documentation

## 2017-01-14 DIAGNOSIS — M5441 Lumbago with sciatica, right side: Secondary | ICD-10-CM | POA: Diagnosis not present

## 2017-01-14 DIAGNOSIS — Z8249 Family history of ischemic heart disease and other diseases of the circulatory system: Secondary | ICD-10-CM | POA: Insufficient documentation

## 2017-01-14 DIAGNOSIS — Z880 Allergy status to penicillin: Secondary | ICD-10-CM | POA: Insufficient documentation

## 2017-01-14 DIAGNOSIS — I9581 Postprocedural hypotension: Secondary | ICD-10-CM | POA: Insufficient documentation

## 2017-01-14 DIAGNOSIS — E785 Hyperlipidemia, unspecified: Secondary | ICD-10-CM | POA: Insufficient documentation

## 2017-01-14 DIAGNOSIS — K219 Gastro-esophageal reflux disease without esophagitis: Secondary | ICD-10-CM | POA: Insufficient documentation

## 2017-01-14 DIAGNOSIS — M5442 Lumbago with sciatica, left side: Secondary | ICD-10-CM

## 2017-01-14 NOTE — Progress Notes (Addendum)
Patient's Name: Jeremiah [name].  MRN: [id number]  Referring Provider: Meade Maw, MD  DOB: 07/13/1940  PCP: Derinda Late, MD  DOS: 01/14/2017  Note by: Dionisio David NP  Service setting: Ambulatory outpatient  Specialty: Interventional Pain Management  Location: [facility] (AMB) Pain Management Facility    Patient type: New Patient    Primary Reason(s) for Visit: Initial Patient Evaluation CC: Back Pain (lower) and Leg Pain (both  right more than left )  HPI  Jeremiah [name] is a 76 y.o. year old, male patient, who comes today for an initial evaluation. He has  Back pain; Dorsalgia, Low back pain; Lumbar radiculopathy;  Rheumatoid arthritis ([place]); on his problem list.. His primarily concern today is the Back Pain (lower) and Leg Pain (both  right more than left )  Pain Assessment: Location: Lower Back Radiating: down the  right   hip and at times and pain goes down the side of the leg to the foot Onset: More than a month ago Duration: Chronic pain Quality: Aching, Constant, Radiating, Sharp, Shooting, Dull Severity: 5 /10 (self-reported pain score)  Note: Reported level is compatible with observation.                   Effect on ADL: pace self Timing: Constant Modifying factors: medicine, trigger point injection   Onset and Duration: Date of onset: 1993 Cause of pain: Work related accident or event Severity: NAS-11 at its worse: 7/10, NAS-11 at its best: 3/10, NAS-11 now: 4/10 and NAS-11 on the average: 7/10 Timing: After activity or exercise Aggravating Factors: Bending, Kneeling, Lifiting, Motion, Prolonged sitting, Prolonged standing, Squatting, Surgery made it worse, Twisting, Walking, Walking uphill, Walking downhill and Working Alleviating Factors: Medications, TENS and Relaxation therapy Associated Problems: Constipation, Inability to control bowel, Numbness, Spasms, Weakness, Pain that wakes patient up and Pain that does not allow patient to sleep Quality of Pain: Annoying,  Burning, Disabling, Exhausting, Feeling of weight and Nagging Previous Examinations or Tests: CT scan, MRI scan and Nerve conduction test Previous Treatments: TENS and Trigger point injections  The patient comes into the clinics today for the first time for a chronic pain management evaluation. According to the patient's primary area pain is in his lower back. He admits this is related to an injury that he suffered 08/26/1991. He admits this is a worker's comp related injury. He suffered a ground-level fall on concrete. He is status post 5 lumbar spine surgeries.  His last surgery was 05/01/2016 for revision. His initial surgery was in the 1980s. He admits the right side is greater than the left. He feels like the pain radiates up the incision site. He describes as a constant pain is worse with standing and walking.  He was last seen by Dr. Diamantina Providence, neurosurgery clinic referred him here.  His second area of pain is in his lower extremities. He admits that the right is greater than the left. He admits the pain goes down the side of his leg radiates into his calf but stops at the ankle. He has some numbness which she states is worse since his last surgery. He admits that he does drag his right foot on occasion.  He is currently using a TENS unit, aqua therapy for pain management  Today I took the time to provide the patient with information regarding this pain practice. The patient was informed that the practice is divided into two sections: an interventional pain management section, as well as a completely separate and  distinct medication management section. I explained that there are procedure days for interventional therapies, and evaluation days for follow-ups and medication management. Because of the amount of documentation required during both, they are kept separated. This means that there is the possibility that he may be scheduled for a procedure on one day, and medication management the next. I  have also informed him that because of staffing and facility limitations, this practice will no longer take patients for medication management only. To illustrate the reasons for this, I gave the patient the example of surgeons, and how inappropriate it would be to refer a patient to his care, just to write for the post-surgical antibiotics on a surgery done by a different surgeon.   Because interventional pain management is part of the board-certified specialty for the doctors, the patient was informed that joining this practice means that they are open to any and all interventional therapies. I made it clear that this does not mean that they will be forced to have any procedures done. What this means is that I believe interventional therapies to be essential part of the diagnosis and proper management of chronic pain conditions. Therefore, patients not interested in these interventional alternatives will be better served under the care of a different practitioner.  The patient was also made aware of my Comprehensive Pain Management Safety Guidelines where by joining this practice, they limit all of their nerve blocks and joint injections to those done by our practice, for as long as we are retained to manage their care. Historic Controlled Substance Pharmacotherapy Review  PMP and historical list of controlled substances: zolpidem ER 6.25 mg, hydrocodone/acetaminophen 7.5/325 mg, zolpidem ER 12.5 mg Highest opioid analgesic regimen found: hydrocodone/acetaminophen 7.5/325 mg 4 times daily(fill date 08/25/2011) hydrocodone 30 mg per day  Most recent opioid analgesic: none Current opioid analgesics: none Highest recorded MME/day: 30 mg/day MME/day: 0 mg/day Medications: The patient did not bring the medication(s) to the appointment, as requested in our "New Patient Package" Pharmacodynamics: Desired effects: Analgesia: The patient reports >50% benefit. Reported improvement in function: The patient  reports medication allows him to accomplish basic ADLs. Clinically meaningful improvement in function (CMIF): Sustained CMIF goals met Perceived effectiveness: Described as relatively effective, allowing for increase in activities of daily living (ADL) Undesirable effects: Side-effects or Adverse reactions: None reported Historical Monitoring: The patient  has no drug history on file. List of all UDS Test(s): No results found for: MDMA, COCAINSCRNUR, PCPSCRNUR, PCPQUANT, CANNABQUANT, THCU, [place] List of all Serum Drug Screening Test(s):  No results found for: AMPHSCRSER, BARBSCRSER, BENZOSCRSER, COCAINSCRSER, PCPSCRSER, PCPQUANT, THCSCRSER, CANNABQUANT, OPIATESCRSER, OXYSCRSER, PROPOXSCRSER Historical Background Evaluation: [place] PDMP: Six (6) year initial data search conducted.             [place] Department of public safety, offender search: Editor, commissioning Information) Non-contributory Risk Assessment Profile: Aberrant behavior: None observed or detected today Risk factors for fatal opioid overdose: None identified today Fatal overdose hazard ratio (HR): Calculation deferred Non-fatal overdose hazard ratio (HR): Calculation deferred Risk of opioid abuse or dependence: 0.7-3.0% with doses ? 36 MME/day and 6.1-26% with doses ? 120 MME/day. Substance use disorder (SUD) risk level: Pending results of Medical Psychology Evaluation for SUD Opioid risk tool (ORT) (Total Score): 1  ORT Scoring interpretation table:  Score <3 = Low Risk for SUD  Score between 4-7 = Moderate Risk for SUD  Score >8 = High Risk for Opioid Abuse   PHQ-2 Depression Scale:  Total score: 0  PHQ-2 Scoring  interpretation table: (Score and probability of major depressive disorder)  Score 0 = No depression  Score 1 = 15.4% Probability  Score 2 = 21.1% Probability  Score 3 = 38.4% Probability  Score 4 = 45.5% Probability  Score 5 = 56.4% Probability  Score 6 = 78.6% Probability   PHQ-9 Depression Scale:  Total score: 0  PHQ-9  Scoring interpretation table:  Score 0-4 = No depression  Score 5-9 = Mild depression  Score 10-14 = Moderate depression  Score 15-19 = Moderately severe depression  Score 20-27 = Severe depression (2.4 times higher risk of SUD and 2.89 times higher risk of overuse)   Pharmacologic Plan: Pending ordered tests and/or consults  Meds  The patient has a current medication list which includes the following prescription(s): aspirin, vitamin-b complex, cinnamon, clindamycin, etanercept, folic acid, hydroxychloroquine, lamotrigine, losartan-hydrochlorothiazide, meloxicam, methotrexate, omeprazole, polyethylene glycol, tizanidine, and trazodone.  Current Outpatient Prescriptions on File Prior to Visit  Medication Sig  . aspirin 81 MG chewable tablet Chew 81 mg by mouth daily.  . Cinnamon 500 MG capsule Take 500 mg by mouth 2 (two) times daily.  . folic acid (FOLVITE) 425 MCG tablet Take 800 mcg by mouth daily.  . hydroxychloroquine (PLAQUENIL) 200 MG tablet Take 400 mg by mouth at bedtime.  . lamoTRIgine (LAMICTAL) 200 MG tablet Take 200 mg by mouth 2 (two) times daily.  Marland Kitchen losartan-hydrochlorothiazide (HYZAAR) 50-12.5 MG tablet Take 1 tablet by mouth daily.  . methotrexate (RHEUMATREX) 2.5 MG tablet Take 25 mg by mouth once a week. Pt takes on Monday.  Marland Kitchen omeprazole (PRILOSEC) 20 MG capsule Take 40 mg by mouth as needed.   . traZODone (DESYREL) 50 MG tablet Take 50 mg by mouth at bedtime.   No current facility-administered medications on file prior to visit.    Imaging Review   Lumbosacral Imaging:  Lumbar DG 2-3 views:  Results for orders placed in visit on 06/16/13  DG Lumbar Spine 2-3 Views   Narrative  PRIOR REPORT IMPORTED FROM AN EXTERNAL SYSTEM *   CLINICAL DATA:  Instability   EXAM:  LUMBAR SPINE - 2-3 VIEW   COMPARISON:  None.   FINDINGS:  There is slight retrolisthesis L1 upon L2 with severe narrowing.  Disc spacers are present at L2-3 and L3-4. Bilateral pedicle screws   and cross-table as in bar is are seen from L2 through L4. Prior  fusion from L4 through S1 through the posterior elements is  suspected. Osteopenia. No definite acute fracture. Mild anterior  wedging at T11 has a chronic appearance. No breakage or obvious  loosening of the hardware. Osteopenia.   IMPRESSION:  No acute bony pathology. Chronic and postoperative changes are  noted.    Electronically Signed    By: Maryclare Bean M.D.    On: 06/16/2013 17:23        Knee Imaging:  Knee-R DG 1-2 views:  Results for orders placed in visit on 07/17/07  DG Knee 1-2 Views Right   Narrative * PRIOR REPORT IMPORTED FROM AN EXTERNAL SYSTEM *   PRIOR REPORT IMPORTED FROM THE [facility] WORKFLOW SYSTEM   REASON FOR EXAM:    Post-operative  COMMENTS:   Bedside (portable):Y   PROCEDURE:     DXR - DXR KNEE RIGHT AP AND LATERAL  - Jul 17 2007  4:21PM   RESULT:     Patellofemoral and femorotibial degenerative change is  present.  Staples are noted over the RIGHT knee. Surgical tubing is noted over the  RIGHT knee.   IMPRESSION:     The patient is status post total RIGHT knee replacement.  Good anatomic alignment is noted.   Thank you for this opportunity to contribute to the care of your patient.        Note: MRI in Care everywhere        ROS  Cardiovascular History: Daily Aspirin intake Pulmonary or Respiratory History: Snoring  Neurological History: Seizure disorder and Abnormal skin sensations (Peripheral Neuropathy) Review of Past Neurological Studies:  Results for orders placed or performed during the hospital encounter of 05/23/15  CT Head Wo Contrast   Narrative   CLINICAL DATA:  Altered mental status.  Weakness.  Hypotension.  EXAM: CT HEAD WITHOUT CONTRAST  TECHNIQUE: Contiguous axial images were obtained from the base of the skull through the vertex without intravenous contrast.  COMPARISON:  Report of 04/23/2003.  FINDINGS: Sinuses/Soft tissues: Clear paranasal  sinuses and mastoid air cells.  Intracranial: No mass lesion, hemorrhage, hydrocephalus, acute infarct, intra-axial, or extra-axial fluid collection.  IMPRESSION: Normal head CT for age.   Electronically Signed   By: Jeronimo Greaves M.D.   On: 05/23/2015 20:26    Psychological-Psychiatric History: Anxiousness Gastrointestinal History: Reflux or heatburn Genitourinary History: No reported renal or genitourinary signs or symptoms such as difficulty voiding or producing urine, peeing blood, non-functioning kidney, kidney stones, difficulty emptying the bladder, difficulty controlling the flow of urine, or chronic kidney disease Hematological History: No reported hematological signs or symptoms such as prolonged bleeding, low or poor functioning platelets, bruising or bleeding easily, hereditary bleeding problems, low energy levels due to low hemoglobin or being anemic Endocrine History: No reported endocrine signs or symptoms such as high or low blood sugar, rapid heart rate due to high thyroid levels, obesity or weight gain due to slow thyroid or thyroid disease Rheumatologic History: Rheumatoid arthritis Musculoskeletal History:  Work History: Disabled  Allergies  Mr. Ohms is allergic to cefoperazone; penicillins; and latex.  Laboratory Chemistry  Inflammation Markers No results found for: CRP, ESRSEDRATE (CRP: Acute Phase) (ESR: Chronic Phase) Renal Function Markers Lab Results  Component Value Date   BUN 8 05/26/2015   CREATININE 0.73 05/26/2015   GFRAA >60 05/26/2015   GFRNONAA >60 05/26/2015   Hepatic Function Markers No results found for: AST, ALT, ALBUMIN, ALKPHOS, HCVAB Electrolytes Lab Results  Component Value Date   NA 138 05/26/2015   K 3.2 (L) 05/26/2015   CL 107 05/26/2015   CALCIUM 8.4 (L) 05/26/2015   MG 1.9 05/25/2015   Neuropathy Markers No results found for: [id number] Bone Pathology Markers Lab Results  Component Value Date   CALCIUM 8.4 (L)  05/26/2015   Coagulation Parameters Lab Results  Component Value Date   PLT 147 (L) 05/25/2015   Cardiovascular Markers Lab Results  Component Value Date   HGB 10.6 (L) 05/25/2015   HCT 31.3 (L) 05/25/2015   Note: Lab results reviewed.  PFSH  Drug: Mr. Warf  has no drug history on file. Alcohol:  reports that he does not drink alcohol. Tobacco:  reports that he has quit smoking. His smoking use included Cigarettes. He has a 20.00 pack-year smoking history. He has never used smokeless tobacco. Medical:  has a past medical history of Allergy; Arthritis; Hypertension; Rheumatoid arthritis (HCC); and Seizures (HCC). Family: family history includes Dementia in his father and mother; Hypertension in his mother and other.  Past Surgical History:  Procedure Laterality Date  . back surgery five times  04/2016  .  CARPAL TUNNEL RELEASE    . HERNIA REPAIR    . JOINT REPLACEMENT    . knee replacement right    . right hip replacement    . rotator cuff surgery    . SPINE SURGERY     Active Ambulatory Problems    Diagnosis Date Noted  . Sepsis ([place]) 05/23/2015  . Long term current use of opiate analgesic 01/14/2017  . Long term prescription opiate use 01/14/2017  . Chronic pain syndrome 01/14/2017  . Opiate use 01/14/2017  . Back pain 05/21/2013  . Dorsalgia, unspecified 05/21/2013  . Esophageal reflux 12/17/2013  . Essential (primary) hypertension 05/21/2013  . Benign essential hypertension 11/13/2013  . Gastroesophageal reflux disease 01/14/2017  . HTN (hypertension) 05/21/2013  . Hyperlipidemia 01/14/2017  . Left bundle branch block 05/21/2013  . Left bundle-branch block 05/21/2013  . Chronic low back pain (Primary Area of Pain) (R>L) 11/13/2013  . Lumbar radiculopathy 01/14/2017  . Postoperative anemia due to acute blood loss 01/14/2017  . Postprocedural hypotension 01/14/2017  . Prostatitis 11/13/2013  . Rheumatoid arthritis ([place]) 12/17/2013  . Seizure ([place]) 11/13/2013   . Type II or unspecified type diabetes mellitus without mention of complication, not stated as uncontrolled 12/17/2013  . Chronic pain of lower extremity (Secondary Area of Pain) (R>L) 01/14/2017   Resolved Ambulatory Problems    Diagnosis Date Noted  . No Resolved Ambulatory Problems   Past Medical History:  Diagnosis Date  . Allergy   . Arthritis   . Hypertension   . Rheumatoid arthritis ([place])   . Seizures ([place])    Constitutional Exam  General appearance: Well nourished, well developed, and well hydrated. In no apparent acute distress Vitals:   01/14/17 0914  BP: 140/75  Pulse: 93  Resp: 16  Temp: 98 F (36.7 C)  SpO2: 99%  Weight: 225 lb (102.1 kg)  Height: 6' (1.829 m)   BMI Assessment: Estimated body mass index is 30.52 kg/m as calculated from the following:   Height as of this encounter: 6' (1.829 m).   Weight as of this encounter: 225 lb (102.1 kg). Psych/Mental status: Alert, oriented x 3 (person, place, & time)       Eyes: PERLA Respiratory: No evidence of acute respiratory distress  Lumbar Spine Exam  Inspection: Well healed scar from previous spine surgery detected Alignment: Symmetrical Functional ROM: Unrestricted ROM      Stability: No instability detected Muscle strength & Tone: Functionally intact Sensory: Unimpaired Palpation: Complains of area being tender to palpation       Provocative Tests: Lumbar Hyperextension and rotation test: Positive bilaterally for facet joint pain. Patrick's Maneuver: evaluation deferred today                    Gait & Posture Assessment  Ambulation: Patient ambulates using a cane Gait: Uneven Posture: WNL   Lower Extremity Exam    Side: Right lower extremity  Side: Left lower extremity  Inspection: Edema  Inspection: Edema  Functional ROM: Unrestricted ROM          Functional ROM: Unrestricted ROM          Muscle strength & Tone: Functionally intact  Muscle strength & Tone: Functionally intact  Sensory:  Unimpaired  Sensory: Unimpaired  Palpation: No palpable anomalies  Palpation: No palpable anomalies   Assessment  Primary Diagnosis & Pertinent Problem List: The primary encounter diagnosis was Chronic bilateral low back pain, with sciatica presence unspecified. Diagnoses of Chronic pain of both lower  extremities, Chronic pain syndrome, and Long term current use of opiate analgesic were also pertinent to this visit.  Visit Diagnosis: 1. Chronic bilateral low back pain, with sciatica presence unspecified   2. Chronic pain of both lower extremities   3. Chronic pain syndrome   4. Long term current use of opiate analgesic    Plan of Care  Initial treatment plan:  Please be advised that as per protocol, today's visit has been an evaluation only. We have not taken over the patient's controlled substance management.  Problem-specific plan: No problem-specific Assessment & Plan notes found for this encounter.  Ordered Lab-work, Procedure(s), Referral(s), & Consult(s): Orders Placed This Encounter  Procedures  . Compliance Drug Analysis, Ur  . Comprehensive metabolic panel  . C-reactive protein  . Sedimentation rate  . Magnesium  . 25-Hydroxyvitamin D Lcms D2+D3  . Vitamin B12  . Ambulatory referral to Psychology   Pharmacotherapy: Medications ordered:  No orders of the defined types were placed in this encounter.  Medications administered during this visit: Mr. Burget had no medications administered during this visit.   Pharmacotherapy under consideration:  Opioid Analgesics: The patient was informed that there is no guarantee that he would be a candidate for opioid analgesics. The decision will be made following CDC guidelines. This decision will be based on the results of diagnostic studies, as well as Mr. Kinne risk profile.  Membrane stabilizer: To be determined at a later time Muscle relaxant: To be determined at a later time NSAID: To be determined at a later time Other  analgesic(s): To be determined at a later time   Interventional therapies under consideration: Mr. Henton was informed that there is no guarantee that he would be a candidate for interventional therapies. The decision will be based on the results of diagnostic studies, as well as Mr. Seeman risk profile.  Possible procedure(s): Diagnostic Bilateral lumbar ESI Diagnostic Bilateral lumbar facet nerve block Possible Bilateral lumbar facet RFA   Provider-requested follow-up: Return for 2nd Visit, w/ Dr. Dossie Arbour, after [facility] eval.  No future appointments.  Primary Care Physician: Derinda Late, MD Location: [facility] Note by:  Date: 01/14/2017; Time: 2:25 PM  Pain Score Disclaimer: We use the NRS-11 scale. This is a self-reported, subjective measurement of pain severity with only modest accuracy. It is used primarily to identify changes within a particular patient. It must be understood that outpatient pain scales are significantly less accurate that those used for research, where they can be applied under ideal controlled circumstances with minimal exposure to variables. In reality, the score is likely to be a combination of pain intensity and pain affect, where pain affect describes the degree of emotional arousal or changes in action readiness caused by the sensory experience of pain. Factors such as social and work situation, setting, emotional state, anxiety levels, expectation, and prior pain experience may influence pain perception and show large inter-individual differences that may also be affected by time variables.  Patient instructions provided during this appointment: Patient Instructions    ____________________________________________________________________________________________  Appointment Policy Summary  It is our goal and responsibility to provide the medical community with assistance in the evaluation and management of patients with  chronic pain. Unfortunately our resources are limited. Because we do not have an unlimited amount of time, or available appointments, we are required to closely monitor and manage their use. The following rules exist to maximize their use:  Patient's responsibilities: 1. Punctuality:  At what time should I arrive?  You should be physically present in our office 30 minutes before your scheduled appointment. Your scheduled appointment is with your assigned healthcare provider. However, it takes 5-10 minutes to be "checked-in", and another 15 minutes for the nurses to do the admission. If you arrive to our office at the time you were given for your appointment, you will end up being at least 20-25 minutes late to your appointment with the provider. 2. Tardiness:  What happens if I arrive only a few minutes after my scheduled appointment time? You will need to reschedule your appointment. The cutoff is your appointment time. This is why it is so important that you arrive at least 30 minutes before that appointment. If you have an appointment scheduled for 10:00 AM and you arrive at 10:01, you will be required to reschedule your appointment.  3. Plan ahead:  Always assume that you will encounter traffic on your way in. Plan for it. If you are dependent on a driver, make sure they understand these rules and the need to arrive early. 4. Other appointments and responsibilities:  Avoid scheduling any other appointments before or after your pain clinic appointments.  5. Be prepared:  Write down everything that you need to discuss with your healthcare provider and give this information to the admitting nurse. Write down the medications that you will need refilled. Bring your pills and bottles (even the empty ones), to all of your appointments, except for those where a procedure is scheduled. 6. No children or pets:  Find someone to take care of them. It is not appropriate to bring them in. 7. Scheduling changes:   We request "advanced notification" of any changes or cancellations. 8. Advanced notification:  Defined as a time period of more than 24 hours prior to the originally scheduled appointment. This allows for the appointment to be offered to other patients. 9. Rescheduling:  When a visit is rescheduled, it will require the cancellation of the original appointment. For this reason they both fall within the category of "Cancellations".  10. Cancellations:  They require advanced notification. Any cancellation less than 24 hours before the  appointment will be recorded as a "No Show". 11. No Show:  Defined as an unkept appointment where the patient failed to notify or declare to the practice their intention or inability to keep the appointment.  Corrective process for repeat offenders:  1. Tardiness: Three (3) episodes of rescheduling due to late arrivals will be recorded as one (1) "No Show". 2. Cancellation or reschedule: Three (3) cancellations or rescheduling will be recorded as one (1) "No Show". 3. "No Shows": Three (3) "No Shows" within a 12 month period will result in discharge from the practice.  ____________________________________________________________________________________________  ____________________________________________________________________________________________  Pain Scale  Introduction: The pain score used by this practice is the Verbal Numerical Rating Scale (VNRS-11). This is an 11-point scale. It is for adults and children 10 years or older. There are significant differences in how the pain score is reported, used, and applied. Forget everything you learned in the past and learn this scoring system.  General Information: The scale should reflect your current level of pain. Unless you are specifically asked for the level of your worst pain, or your average pain. If you are asked for one of these two, then it should be understood that it is over the past 24  hours.  Basic Activities of Daily Living (ADL): Personal hygiene, dressing, eating, transferring, and using restroom.  Instructions: Most patients tend to report  their level of pain as a combination of two factors, their physical pain and their psychosocial pain. This last one is also known as "suffering" and it is reflection of how physical pain affects you socially and psychologically. From now on, report them separately. From this point on, when asked to report your pain level, report only your physical pain. Use the following table for reference.  Pain Clinic Pain Levels (0-5/10)  Pain Level Score  Description  No Pain 0   Mild pain 1 Nagging, annoying, but does not interfere with basic activities of daily living (ADL). Patients are able to eat, bathe, get dressed, toileting (being able to get on and off the toilet and perform personal hygiene functions), transfer (move in and out of bed or a chair without assistance), and maintain continence (able to control bladder and bowel functions). Blood pressure and heart rate are unaffected. A normal heart rate for a healthy adult ranges from 60 to 100 bpm (beats per minute).   Mild to moderate pain 2 Noticeable and distracting. Impossible to hide from other people. More frequent flare-ups. Still possible to adapt and function close to normal. It can be very annoying and may have occasional stronger flare-ups. With discipline, patients may get used to it and adapt.   Moderate pain 3 Interferes significantly with activities of daily living (ADL). It becomes difficult to feed, bathe, get dressed, get on and off the toilet or to perform personal hygiene functions. Difficult to get in and out of bed or a chair without assistance. Very distracting. With effort, it can be ignored when deeply involved in activities.   Moderately severe pain 4 Impossible to ignore for more than a few minutes. With effort, patients may still be able to manage work or participate  in some social activities. Very difficult to concentrate. Signs of autonomic nervous system discharge are evident: dilated pupils (mydriasis); mild sweating (diaphoresis); sleep interference. Heart rate becomes elevated (>115 bpm). Diastolic blood pressure (lower number) rises above 100 mmHg. Patients find relief in laying down and not moving.   Severe pain 5 Intense and extremely unpleasant. Associated with frowning face and frequent crying. Pain overwhelms the senses.  Ability to do any activity or maintain social relationships becomes significantly limited. Conversation becomes difficult. Pacing back and forth is common, as getting into a comfortable position is nearly impossible. Pain wakes you up from deep sleep. Physical signs will be obvious: pupillary dilation; increased sweating; goosebumps; brisk reflexes; cold, clammy hands and feet; nausea, vomiting or dry heaves; loss of appetite; significant sleep disturbance with inability to fall asleep or to remain asleep. When persistent, significant weight loss is observed due to the complete loss of appetite and sleep deprivation.  Blood pressure and heart rate becomes significantly elevated. Caution: If elevated blood pressure triggers a pounding headache associated with blurred vision, then the patient should immediately seek attention at an urgent or emergency care unit, as these may be signs of an impending stroke.    Emergency Department Pain Levels (6-10/10)  Emergency Room Pain 6 Severely limiting. Requires emergency care and should not be seen or managed at an outpatient pain management facility. Communication becomes difficult and requires great effort. Assistance to reach the emergency department may be required. Facial flushing and profuse sweating along with potentially dangerous increases in heart rate and blood pressure will be evident.   Distressing pain 7 Self-care is very difficult. Assistance is required to transport, or use restroom.  Assistance to reach the emergency department  will be required. Tasks requiring coordination, such as bathing and getting dressed become very difficult.   Disabling pain 8 Self-care is no longer possible. At this level, pain is disabling. The individual is unable to do even the most "basic" activities such as walking, eating, bathing, dressing, transferring to a bed, or toileting. Fine motor skills are lost. It is difficult to think clearly.   Incapacitating pain 9 Pain becomes incapacitating. Thought processing is no longer possible. Difficult to remember your own name. Control of movement and coordination are lost.   The worst pain imaginable 10 At this level, most patients pass out from pain. When this level is reached, collapse of the autonomic nervous system occurs, leading to a sudden drop in blood pressure and heart rate. This in turn results in a temporary and dramatic drop in blood flow to the brain, leading to a loss of consciousness. Fainting is one of the body's self defense mechanisms. Passing out puts the brain in a calmed state and causes it to shut down for a while, in order to begin the healing process.    Summary: 1. Refer to this scale when providing [place] with your pain level. 2. Be accurate and careful when reporting your pain level. This will help with your care. 3. Over-reporting your pain level will lead to loss of credibility. 4. Even a level of 1/10 means that there is pain and will be treated at our facility. 5. High, inaccurate reporting will be documented as "Symptom Exaggeration", leading to loss of credibility and suspicions of possible secondary gains such as obtaining more narcotics, or wanting to appear disabled, for fraudulent reasons. 6. Only pain levels of 5 or below will be seen at our facility. 7. Pain levels of 6 and above will be sent to the Emergency Department and the appointment  cancelled. ____________________________________________________________________________________________  BMI Assessment: Estimated body mass index is 30.52 kg/m as calculated from the following:   Height as of this encounter: 6' (1.829 m).   Weight as of this encounter: 225 lb (102.1 kg).  BMI interpretation table: BMI level Category Range association with higher incidence of chronic pain  <18 kg/m2 Underweight   18.5-24.9 kg/m2 Ideal body weight   25-29.9 kg/m2 Overweight Increased incidence by 20%  30-34.9 kg/m2 Obese (Class I) Increased incidence by 68%  35-39.9 kg/m2 Severe obesity (Class II) Increased incidence by 136%  >40 kg/m2 Extreme obesity (Class III) Increased incidence by 254%   BMI Readings from Last 4 Encounters:  01/14/17 30.52 kg/m  05/23/15 30.28 kg/m   Wt Readings from Last 4 Encounters:  01/14/17 225 lb (102.1 kg)  05/23/15 229 lb 8 oz (104.1 kg)   Pain Management Discharge Instructions  General Discharge Instructions :  If you need to reach your doctor call: Monday-Friday 8:00 am - 4:00 pm at [phone] or toll free [phone].  After clinic hours [phone] to have operator reach doctor.  Bring all of your medication bottles to all your appointments in the pain clinic.  To cancel or reschedule your appointment with Pain Management please remember to call 24 hours in advance to avoid a fee.  Refer to the educational materials which you have been given on: General Risks, I had my Procedure. Discharge Instructions, Post Sedation.  Post Procedure Instructions:  The drugs you were given will stay in your system until tomorrow, so for the next 24 hours you should not drive, make any legal decisions or drink any alcoholic beverages.  You may eat anything you  prefer, but it is better to start with liquids then soups and crackers, and gradually work up to solid foods.  Please notify your doctor immediately if you have any unusual bleeding, trouble  breathing or pain that is not related to your normal pain.  Depending on the type of procedure that was done, some parts of your body may feel week and/or numb.  This usually clears up by tonight or the next day.  Walk with the use of an assistive device or accompanied by an adult for the 24 hours.  You may use ice on the affected area for the first 24 hours.  Put ice in a Ziploc bag and cover with a towel and place against area 15 minutes on 15 minutes off.  You may switch to heat after 24 hours.

## 2017-01-14 NOTE — Patient Instructions (Addendum)
____________________________________________________________________________________________  Appointment Policy Summary  It is our goal and responsibility to provide the medical community with assistance in the evaluation and management of patients with chronic pain. Unfortunately our resources are limited. Because we do not have an unlimited amount of time, or available appointments, we are required to closely monitor and manage their use. The following rules exist to maximize their use:  Patient's responsibilities: 1. Punctuality:  At what time should I arrive? You should be physically present in our office 30 minutes before your scheduled appointment. Your scheduled appointment is with your assigned healthcare provider. However, it takes 5-10 minutes to be "checked-in", and another 15 minutes for the nurses to do the admission. If you arrive to our office at the time you were given for your appointment, you will end up being at least 20-25 minutes late to your appointment with the provider. 2. Tardiness:  What happens if I arrive only a few minutes after my scheduled appointment time? You will need to reschedule your appointment. The cutoff is your appointment time. This is why it is so important that you arrive at least 30 minutes before that appointment. If you have an appointment scheduled for 10:00 AM and you arrive at 10:01, you will be required to reschedule your appointment.  3. Plan ahead:  Always assume that you will encounter traffic on your way in. Plan for it. If you are dependent on a driver, make sure they understand these rules and the need to arrive early. 4. Other appointments and responsibilities:  Avoid scheduling any other appointments before or after your pain clinic appointments.  5. Be prepared:  Write down everything that you need to discuss with your healthcare provider and give this information to the admitting nurse. Write down the medications that you will need  refilled. Bring your pills and bottles (even the empty ones), to all of your appointments, except for those where a procedure is scheduled. 6. No children or pets:  Find someone to take care of them. It is not appropriate to bring them in. 7. Scheduling changes:  We request "advanced notification" of any changes or cancellations. 8. Advanced notification:  Defined as a time period of more than 24 hours prior to the originally scheduled appointment. This allows for the appointment to be offered to other patients. 9. Rescheduling:  When a visit is rescheduled, it will require the cancellation of the original appointment. For this reason they both fall within the category of "Cancellations".  10. Cancellations:  They require advanced notification. Any cancellation less than 24 hours before the  appointment will be recorded as a "No Show". 11. No Show:  Defined as an unkept appointment where the patient failed to notify or declare to the practice their intention or inability to keep the appointment.  Corrective process for repeat offenders:  1. Tardiness: Three (3) episodes of rescheduling due to late arrivals will be recorded as one (1) "No Show". 2. Cancellation or reschedule: Three (3) cancellations or rescheduling will be recorded as one (1) "No Show". 3. "No Shows": Three (3) "No Shows" within a 12 month period will result in discharge from the practice.  ____________________________________________________________________________________________  ____________________________________________________________________________________________  Pain Scale  Introduction: The pain score used by this practice is the Verbal Numerical Rating Scale (VNRS-11). This is an 11-point scale. It is for adults and children 10 years or older. There are significant differences in how the pain score is reported, used, and applied. Forget everything you learned in the past and  learn this scoring  system.  General Information: The scale should reflect your current level of pain. Unless you are specifically asked for the level of your worst pain, or your average pain. If you are asked for one of these two, then it should be understood that it is over the past 24 hours.  Basic Activities of Daily Living (ADL): Personal hygiene, dressing, eating, transferring, and using restroom.  Instructions: Most patients tend to report their level of pain as a combination of two factors, their physical pain and their psychosocial pain. This last one is also known as "suffering" and it is reflection of how physical pain affects you socially and psychologically. From now on, report them separately. From this point on, when asked to report your pain level, report only your physical pain. Use the following table for reference.  Pain Clinic Pain Levels (0-5/10)  Pain Level Score  Description  No Pain 0   Mild pain 1 Nagging, annoying, but does not interfere with basic activities of daily living (ADL). Patients are able to eat, bathe, get dressed, toileting (being able to get on and off the toilet and perform personal hygiene functions), transfer (move in and out of bed or a chair without assistance), and maintain continence (able to control bladder and bowel functions). Blood pressure and heart rate are unaffected. A normal heart rate for a healthy adult ranges from 60 to 100 bpm (beats per minute).   Mild to moderate pain 2 Noticeable and distracting. Impossible to hide from other people. More frequent flare-ups. Still possible to adapt and function close to normal. It can be very annoying and may have occasional stronger flare-ups. With discipline, patients may get used to it and adapt.   Moderate pain 3 Interferes significantly with activities of daily living (ADL). It becomes difficult to feed, bathe, get dressed, get on and off the toilet or to perform personal hygiene functions. Difficult to get in and out of  bed or a chair without assistance. Very distracting. With effort, it can be ignored when deeply involved in activities.   Moderately severe pain 4 Impossible to ignore for more than a few minutes. With effort, patients may still be able to manage work or participate in some social activities. Very difficult to concentrate. Signs of autonomic nervous system discharge are evident: dilated pupils (mydriasis); mild sweating (diaphoresis); sleep interference. Heart rate becomes elevated (>115 bpm). Diastolic blood pressure (lower number) rises above 100 mmHg. Patients find relief in laying down and not moving.   Severe pain 5 Intense and extremely unpleasant. Associated with frowning face and frequent crying. Pain overwhelms the senses.  Ability to do any activity or maintain social relationships becomes significantly limited. Conversation becomes difficult. Pacing back and forth is common, as getting into a comfortable position is nearly impossible. Pain wakes you up from deep sleep. Physical signs will be obvious: pupillary dilation; increased sweating; goosebumps; brisk reflexes; cold, clammy hands and feet; nausea, vomiting or dry heaves; loss of appetite; significant sleep disturbance with inability to fall asleep or to remain asleep. When persistent, significant weight loss is observed due to the complete loss of appetite and sleep deprivation.  Blood pressure and heart rate becomes significantly elevated. Caution: If elevated blood pressure triggers a pounding headache associated with blurred vision, then the patient should immediately seek attention at an urgent or emergency care unit, as these may be signs of an impending stroke.    Emergency Department Pain Levels (6-10/10)  Emergency Room Pain 6   Severely limiting. Requires emergency care and should not be seen or managed at an outpatient pain management facility. Communication becomes difficult and requires great effort. Assistance to reach the  emergency department may be required. Facial flushing and profuse sweating along with potentially dangerous increases in heart rate and blood pressure will be evident.   Distressing pain 7 Self-care is very difficult. Assistance is required to transport, or use restroom. Assistance to reach the emergency department will be required. Tasks requiring coordination, such as bathing and getting dressed become very difficult.   Disabling pain 8 Self-care is no longer possible. At this level, pain is disabling. The individual is unable to do even the most "basic" activities such as walking, eating, bathing, dressing, transferring to a bed, or toileting. Fine motor skills are lost. It is difficult to think clearly.   Incapacitating pain 9 Pain becomes incapacitating. Thought processing is no longer possible. Difficult to remember your own name. Control of movement and coordination are lost.   The worst pain imaginable 10 At this level, most patients pass out from pain. When this level is reached, collapse of the autonomic nervous system occurs, leading to a sudden drop in blood pressure and heart rate. This in turn results in a temporary and dramatic drop in blood flow to the brain, leading to a loss of consciousness. Fainting is one of the body's self defense mechanisms. Passing out puts the brain in a calmed state and causes it to shut down for a while, in order to begin the healing process.    Summary: 1. Refer to this scale when providing [place] with your pain level. 2. Be accurate and careful when reporting your pain level. This will help with your care. 3. Over-reporting your pain level will lead to loss of credibility. 4. Even a level of 1/10 means that there is pain and will be treated at our facility. 5. High, inaccurate reporting will be documented as "Symptom Exaggeration", leading to loss of credibility and suspicions of possible secondary gains such as obtaining more narcotics, or wanting to appear  disabled, for fraudulent reasons. 6. Only pain levels of 5 or below will be seen at our facility. 7. Pain levels of 6 and above will be sent to the Emergency Department and the appointment cancelled. ____________________________________________________________________________________________  BMI Assessment: Estimated body mass index is 30.52 kg/m as calculated from the following:   Height as of this encounter: 6' (1.829 m).   Weight as of this encounter: 225 lb (102.1 kg).  BMI interpretation table: BMI level Category Range association with higher incidence of chronic pain  <18 kg/m2 Underweight   18.5-24.9 kg/m2 Ideal body weight   25-29.9 kg/m2 Overweight Increased incidence by 20%  30-34.9 kg/m2 Obese (Class I) Increased incidence by 68%  35-39.9 kg/m2 Severe obesity (Class II) Increased incidence by 136%  >40 kg/m2 Extreme obesity (Class III) Increased incidence by 254%   BMI Readings from Last 4 Encounters:  01/14/17 30.52 kg/m  05/23/15 30.28 kg/m   Wt Readings from Last 4 Encounters:  01/14/17 225 lb (102.1 kg)  05/23/15 229 lb 8 oz (104.1 kg)   Pain Management Discharge Instructions  General Discharge Instructions :  If you need to reach your doctor call: Monday-Friday 8:00 am - 4:00 pm at [phone] or toll free [phone].  After clinic hours [phone] to have operator reach doctor.  Bring all of your medication bottles to all your appointments in the pain clinic.  To cancel or reschedule your appointment with Pain  Management please remember to call 24 hours in advance to avoid a fee.  Refer to the educational materials which you have been given on: General Risks, I had my Procedure. Discharge Instructions, Post Sedation.  Post Procedure Instructions:  The drugs you were given will stay in your system until tomorrow, so for the next 24 hours you should not drive, make any legal decisions or drink any alcoholic beverages.  You may eat anything you  prefer, but it is better to start with liquids then soups and crackers, and gradually work up to solid foods.  Please notify your doctor immediately if you have any unusual bleeding, trouble breathing or pain that is not related to your normal pain.  Depending on the type of procedure that was done, some parts of your body may feel week and/or numb.  This usually clears up by tonight or the next day.  Walk with the use of an assistive device or accompanied by an adult for the 24 hours.  You may use ice on the affected area for the first 24 hours.  Put ice in a Ziploc bag and cover with a towel and place against area 15 minutes on 15 minutes off.  You may switch to heat after 24 hours.

## 2017-01-14 NOTE — Progress Notes (Signed)
Safety precautions to be maintained throughout the outpatient stay will include: orient to surroundings, keep bed in low position, maintain call bell within reach at all times, provide assistance with transfer out of bed and ambulation.  

## 2017-01-18 LAB — VITAMIN B12: Vitamin B-12: 995 pg/mL (ref 232–1245)

## 2017-01-18 LAB — 25-HYDROXY VITAMIN D LCMS D2+D3
25-Hydroxy, Vitamin D-2: <1 ng/mL
25-Hydroxy, Vitamin D-3: 28 ng/mL
25-Hydroxy, Vitamin D: 28 ng/mL — ABNORMAL LOW

## 2017-01-18 LAB — MAGNESIUM: Magnesium: 1.8 mg/dL (ref 1.6–2.3)

## 2017-01-18 LAB — SEDIMENTATION RATE: SED RATE: 7 mm/h (ref 0–30)

## 2017-01-18 LAB — COMPREHENSIVE METABOLIC PANEL
A/G RATIO: 1.5 (ref 1.2–2.2)
ALK PHOS: 96 IU/L (ref 39–117)
ALT: 14 IU/L (ref 0–44)
AST: 22 IU/L (ref 0–40)
Albumin: 3.8 g/dL (ref 3.5–4.8)
BUN/Creatinine Ratio: 14 (ref 10–24)
BUN: 11 mg/dL (ref 8–27)
Bilirubin Total: 0.3 mg/dL (ref 0.0–1.2)
CHLORIDE: 95 mmol/L — AB (ref 96–106)
CO2: 27 mmol/L (ref 20–29)
Calcium: 9.3 mg/dL (ref 8.6–10.2)
Creatinine, Ser: 0.8 mg/dL (ref 0.76–1.27)
GFR calc Af Amer: 101 mL/min/{1.73_m2} (ref >59)
GFR calc non Af Amer: 87 mL/min/{1.73_m2} (ref >59)
GLOBULIN, TOTAL: 2.6 g/dL (ref 1.5–4.5)
Glucose: 81 mg/dL (ref 65–99)
POTASSIUM: 4.4 mmol/L (ref 3.5–5.2)
SODIUM: 133 mmol/L — AB (ref 134–144)
Total Protein: 6.4 g/dL (ref 6.0–8.5)

## 2017-01-18 LAB — COMPLIANCE DRUG ANALYSIS, UR

## 2017-01-18 LAB — C-REACTIVE PROTEIN: CRP: 37.5 mg/L — AB (ref 0.0–4.9)

## 2017-01-31 ENCOUNTER — Telehealth: Payer: Self-pay

## 2017-01-31 NOTE — Telephone Encounter (Signed)
I will need to discuss with Dr. Dossie Arbour. I will forward this messsage to him to advise.

## 2017-01-31 NOTE — Telephone Encounter (Signed)
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 Therefore he would not need to to the med pysc evaluation. He is interested in wave/water therapy. Maybe Dr. Dossie Arbour can discuss this at his next appointment. So, If it is ok with Dr. Dossie Arbour can I schedule a second visit appointment for the patient, without him having to do a med pysc eval ?

## 2017-02-07 NOTE — Telephone Encounter (Signed)
W/C adjuster is checking on this. Do we have an answer? Thanks

## 2017-02-19 ENCOUNTER — Encounter: Payer: Self-pay | Admitting: Pain Medicine

## 2017-02-19 DIAGNOSIS — M4310 Spondylolisthesis, site unspecified: Secondary | ICD-10-CM | POA: Insufficient documentation

## 2017-02-19 DIAGNOSIS — M899 Disorder of bone, unspecified: Secondary | ICD-10-CM | POA: Insufficient documentation

## 2017-02-19 DIAGNOSIS — M961 Postlaminectomy syndrome, not elsewhere classified: Secondary | ICD-10-CM | POA: Insufficient documentation

## 2017-02-19 DIAGNOSIS — M4854XS Collapsed vertebra, not elsewhere classified, thoracic region, sequela of fracture: Secondary | ICD-10-CM | POA: Insufficient documentation

## 2017-02-19 DIAGNOSIS — M5136 Other intervertebral disc degeneration, lumbar region: Secondary | ICD-10-CM | POA: Insufficient documentation

## 2017-02-19 DIAGNOSIS — Z789 Other specified health status: Secondary | ICD-10-CM | POA: Insufficient documentation

## 2017-02-19 DIAGNOSIS — Z79899 Other long term (current) drug therapy: Secondary | ICD-10-CM | POA: Insufficient documentation

## 2017-02-19 DIAGNOSIS — M48061 Spinal stenosis, lumbar region without neurogenic claudication: Secondary | ICD-10-CM | POA: Insufficient documentation

## 2017-02-19 DIAGNOSIS — M25561 Pain in right knee: Secondary | ICD-10-CM

## 2017-02-19 DIAGNOSIS — M25562 Pain in left knee: Secondary | ICD-10-CM

## 2017-02-19 DIAGNOSIS — M47816 Spondylosis without myelopathy or radiculopathy, lumbar region: Secondary | ICD-10-CM | POA: Insufficient documentation

## 2017-02-19 DIAGNOSIS — Z96651 Presence of right artificial knee joint: Secondary | ICD-10-CM

## 2017-02-19 DIAGNOSIS — G8929 Other chronic pain: Secondary | ICD-10-CM | POA: Insufficient documentation

## 2017-02-19 DIAGNOSIS — M51369 Other intervertebral disc degeneration, lumbar region without mention of lumbar back pain or lower extremity pain: Secondary | ICD-10-CM | POA: Insufficient documentation

## 2017-02-19 DIAGNOSIS — M4804 Spinal stenosis, thoracic region: Secondary | ICD-10-CM | POA: Insufficient documentation

## 2017-02-19 NOTE — Progress Notes (Deleted)
Patient's Name: Jeremiah [name].  MRN: [id number]  Referring Provider: Derinda Late, MD  DOB: [birth year]  PCP: Derinda Late, MD  DOS: 02/20/2017  Note by: Gaspar Cola, MD  Service setting: Ambulatory outpatient  Specialty: Interventional Pain Management  Location: [facility] (AMB) Pain Management Facility    Patient type: Established   Primary Reason(s) for Visit: Encounter for evaluation before starting new chronic pain management plan of care (Level of risk: moderate) CC: No chief complaint on file.  HPI  Jeremiah [name] is a 76 y.o. year old, male patient, who comes today for a follow-up evaluation to review the test results and decide on a treatment plan. He has Long term current use of opiate analgesic; Chronic pain syndrome; Opiate use; Benign essential hypertension; Gastroesophageal reflux disease; Hyperlipidemia; Left bundle branch block; Chronic low back pain (Primary Area of Pain) (Bilateral) (R>L); Lumbar radiculopathy; Postoperative anemia due to acute blood loss; Postprocedural hypotension; Prostatitis; Rheumatoid arthritis ([place]); Seizure ([place]); Type II or unspecified type diabetes mellitus without mention of complication, not stated as uncontrolled; Chronic lower extremity pain (Secondary Area of Pain) (Bilateral) (R>L); Failed back surgical syndrome; DDD (degenerative disc disease), lumbar; Spondylosis of lumbar spine; Chronic knee pain (Right); 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 His primarily concern today is the No chief complaint on file.  Pain Assessment: Location:     Radiating:   Onset:   Duration:    Quality:   Severity:  /10 (self-reported pain score)  Note: Reported level is compatible with observation.                   When using our objective Pain Scale, levels between 6 and 10/10 are said to belong in an emergency room, as it progressively worsens from a 6/10, described as severely limiting, requiring emergency care not usually available at an outpatient pain management facility. At a 6/10 level, communication becomes difficult and requires great effort. Assistance to reach the emergency department may be required. Facial flushing and profuse sweating along with potentially dangerous increases in heart rate and blood pressure will be evident. Effect on ADL:   Timing:   Modifying factors:    Jeremiah [name] comes in today for a follow-up visit after his initial evaluation on 01/31/2017. Today we went over the results of his tests. These were explained in "Layman's terms". During today's appointment we went over my diagnostic impression, as well as the proposed treatment plan.  According to the patient's primary area pain is in his lower back. He admits this is related to an injury that he suffered 08/26/1991. He admits this is a worker's comp related injury. He suffered a ground-level fall on concrete. He is status post 5 lumbar spine surgeries. Last surgery being in 05/01/2016 for revision. His initial surgery was in the 1980s. He admits the right side is greater than the left. He feels like the pain radiates up the incision site. He describes as a constant pain is worse with standing and walking.  He was last seen by Dr. Diamantina [place], neurosurgery clinic referred him here.  His second area of pain is in his lower extremities. He admits that the right is greater than the left. He  admits the pain is on the the lateral portion of his leg radiates into his calf but stops at the ankle. He has some numbness which she states is worse since his last surgery. He admits that he does drag his right foot on  occasion.  He is currently using a TENS unit, aqua therapy for pain management  In considering the treatment plan options, Mr. Mehringer was reminded that I no longer take patients for medication management only. I asked him to let me know if he had no intention of taking advantage of the interventional therapies, so that we could make arrangements to provide this space to someone interested. I also made it clear that undergoing interventional therapies for the purpose of getting pain medications is very inappropriate on the part of a patient, and it will not be tolerated in this practice. This type of behavior would suggest true addiction and therefore it requires referral to an addiction specialist.   Further details on both, my assessment(s), as well as the proposed treatment plan, please see below.  Controlled Substance Pharmacotherapy Assessment REMS (Risk Evaluation and Mitigation Strategy)  Analgesic:none Highest recorded MME/day: 30 mg/day MME/day: 0 mg/day Pill Count: None expected due to no prior prescriptions written by our practice. No notes on file Pharmacokinetics: Liberation and absorption (onset of action): WNL Distribution (time to peak effect): WNL Metabolism and excretion (duration of action): WNL         Pharmacodynamics: Desired effects: Analgesia: [name] reports >50% benefit. Functional ability: Patient reports that medication allows him to accomplish basic ADLs Clinically meaningful improvement in function (CMIF): Sustained CMIF goals met Perceived effectiveness: Described as relatively effective, allowing for increase in activities of daily living (ADL) Undesirable effects: Side-effects or Adverse reactions: None reported Monitoring: [place] PMP: Online review of the past 60-monthperiod previously conducted. Not applicable at this point since we have not taken over the patient's medication management yet. List of all Serum Drug Screening Test(s):  No results found  for: AMPHSCRSER, BARBSCRSER, BENZOSCRSER, COCAINSCRSER, PCPSCRSER, THCSCRSER, OPIATESCRSER, [place] UDS test(s) done:  Lab Results  Component Value Date   SUMMARY FINAL 01/14/2017   Last UDS on record: Summary  Date Value Ref Range Status  01/14/2017 FINAL  Final    Comment:    ==================================================================== TOXASSURE COMP DRUG ANALYSIS,UR ==================================================================== Test                             Result       Flag       Units Drug Present and Declared for Prescription Verification   Lamotrigine                    PRESENT      EXPECTED Drug Absent but Declared for Prescription Verification   Tizanidine                     Not Detected UNEXPECTED    Tizanidine, as indicated in the declared medication list, is not    always detected even when used as directed.   Trazodone                      Not Detected UNEXPECTED   Salicylate                     Not Detected UNEXPECTED    Aspirin, as indicated in the declared medication  list, is not    always detected even when used as directed. ==================================================================== Test                      Result    Flag   Units      Ref Range   Creatinine              50               mg/dL      >=20 ==================================================================== Declared Medications:  The flagging and interpretation on this report are based on the  following declared medications.  Unexpected results may arise from  inaccuracies in the declared medications.  **Note: The testing scope of this panel includes these medications:  Lamotrigine (Lamictal)  Trazodone (Desyrel)  **Note: The testing scope of this panel does not include small to  moderate amounts of these reported medications:  Aspirin (Aspirin 81)  Tizanidine (Zanaflex)  **Note: The testing scope of this panel does not include following   reported medications:  Clindamycin  Etanercept (Enbrel)  Folic acid (Folvite)  Hydrochlorothiazide (Hyzaar)  Hydroxychloroquine (Plaquenil)  Losartan (Hyzaar)  Meloxicam (Mobic)  Methotrexate  Omeprazole (Prilosec)  Polyethylene Glycol (MiraLAX)  Vitamin B (Super B Complex) ==================================================================== For clinical consultation, please call [phone]. ====================================================================    UDS interpretation: No unexpected findings.          Medication Assessment Form: Patient introduced to form today Treatment compliance: Treatment may start today if patient agrees with proposed plan. Evaluation of compliance is not applicable at this point Risk Assessment Profile: Aberrant behavior: See initial evaluations. None observed or detected today Comorbid factors increasing risk of overdose: See initial evaluation. No additional risks detected today Medical Psychology Evaluation: Please see scanned results in medical record.     Opioid Risk Tool - 01/14/17 0932      Family History of Substance Abuse   Alcohol Negative   Illegal Drugs Negative   Rx Drugs Negative     Personal History of Substance Abuse   Alcohol Negative   Illegal Drugs Negative   Rx Drugs Negative     Age   Age between 75-45 years  No     History of Preadolescent Sexual Abuse   History of Preadolescent Sexual Abuse Negative or Male     Psychological Disease   Psychological Disease Negative   Depression Positive     Total Score   Opioid Risk Tool Scoring 1   Opioid Risk Interpretation Low Risk     ORT Scoring interpretation table:  Score <3 = Low Risk for SUD  Score between 4-7 = Moderate Risk for SUD  Score >8 = High Risk for Opioid Abuse   Risk Mitigation Strategies:  Patient opioid safety counseling: Completed today. Counseling provided to patient as per "Patient Counseling Document". Document signed by patient,  attesting to counseling and understanding Patient-Prescriber Agreement (PPA): Obtained today.  Controlled substance notification to other providers: Written and sent today.  Pharmacologic Plan: Today we may be taking over the patient's pharmacological regimen. See below             Laboratory Chemistry  Inflammation Markers (CRP: Acute Phase) (ESR: Chronic Phase) Lab Results  Component Value Date   CRP 37.5 (H) 01/14/2017   ESRSEDRATE 7 01/14/2017                 Renal Function Markers Lab Results  Component Value Date   BUN 11 01/14/2017  CREATININE 0.80 01/14/2017   GFRAA 101 01/14/2017   GFRNONAA 87 01/14/2017                 Hepatic Function Markers Lab Results  Component Value Date   AST 22 01/14/2017   ALT 14 01/14/2017   ALBUMIN 3.8 01/14/2017   ALKPHOS 96 01/14/2017                 Electrolytes Lab Results  Component Value Date   NA 133 (L) 01/14/2017   K 4.4 01/14/2017   CL 95 (L) 01/14/2017   CALCIUM 9.3 01/14/2017   MG 1.8 01/14/2017                 Neuropathy Markers Lab Results  Component Value Date   VITAMINB12 995 01/14/2017                 Bone Pathology Markers Lab Results  Component Value Date   ALKPHOS 96 01/14/2017   25OHVITD1 28 (L) 01/14/2017   25OHVITD2 <1.0 01/14/2017   25OHVITD3 28 01/14/2017   CALCIUM 9.3 01/14/2017                 Coagulation Parameters Lab Results  Component Value Date   PLT 147 (L) 05/25/2015                 Cardiovascular Markers Lab Results  Component Value Date   HGB 10.6 (L) 05/25/2015   HCT 31.3 (L) 05/25/2015                 Note: Lab results reviewed.  Recent Diagnostic Imaging Review  Lumbosacral Imaging: Lumbar DG 2-3 views:  Results for orders placed in visit on 06/16/13  DG Lumbar Spine 2-3 Views   Narrative * PRIOR REPORT IMPORTED FROM AN EXTERNAL SYSTEM *   CLINICAL DATA:  Instability   EXAM:  LUMBAR SPINE - 2-3 VIEW   COMPARISON:  None.   FINDINGS:  There is slight  retrolisthesis L1 upon L2 with severe narrowing.  Disc spacers are present at L2-3 and L3-4. Bilateral pedicle screws  and cross-table as in bar is are seen from L2 through L4. Prior  fusion from L4 through S1 through the posterior elements is  suspected. Osteopenia. No definite acute fracture. Mild anterior  wedging at T11 has a chronic appearance. No breakage or obvious  loosening of the hardware. Osteopenia.   IMPRESSION:  No acute bony pathology. Chronic and postoperative changes are  noted.    Electronically Signed    By: Maryclare Bean M.D.    On: 06/16/2013 17:23       Knee Imaging: Knee-R DG 1-2 views:  Results for orders placed in visit on 07/17/07  DG Knee 1-2 Views Right   Narrative * PRIOR REPORT IMPORTED FROM AN EXTERNAL SYSTEM *   PRIOR REPORT IMPORTED FROM THE [facility] WORKFLOW SYSTEM   REASON FOR EXAM:    Post-operative  COMMENTS:   Bedside (portable):Y   PROCEDURE:     DXR - DXR KNEE RIGHT AP AND LATERAL  - Jul 17 2007  4:21PM   RESULT:     Patellofemoral and femorotibial degenerative change is  present.  Staples are noted over the RIGHT knee. Surgical tubing is noted over the  RIGHT knee.   IMPRESSION:     The patient is status post total RIGHT knee replacement.  Good anatomic alignment is noted.   Thank you for this opportunity to contribute to the care of your  patient.       Note: Results of ordered imaging test(s) reviewed and explained to patient in Layman's terms. Copy of results provided to patient.  Meds   Current Outpatient Prescriptions:  .  aspirin 81 MG chewable tablet, Chew 81 mg by mouth daily., Disp: , Rfl:  .  B Complex Vitamins (VITAMIN-B COMPLEX) TABS, Take by mouth daily. , Disp: , Rfl:  .  Cinnamon 500 MG capsule, Take 500 mg by mouth 2 (two) times daily., Disp: , Rfl:  .  clindamycin (CLEOCIN) 150 MG capsule, Take 150 mg by mouth. , Disp: , Rfl:  .  etanercept (ENBREL SURECLICK) 50 MG/ML injection, Inject 50 mg into the skin once a  week. , Disp: , Rfl:  .  folic acid (FOLVITE) 800 MCG tablet, Take 800 mcg by mouth daily., Disp: , Rfl:  .  hydroxychloroquine (PLAQUENIL) 200 MG tablet, Take 400 mg by mouth at bedtime., Disp: , Rfl:  .  lamoTRIgine (LAMICTAL) 200 MG tablet, Take 200 mg by mouth 2 (two) times daily., Disp: , Rfl:  .  losartan-hydrochlorothiazide (HYZAAR) 50-12.5 MG tablet, Take 1 tablet by mouth daily., Disp: , Rfl:  .  meloxicam (MOBIC) 15 MG tablet, Take 15 mg by mouth daily. , Disp: , Rfl:  .  methotrexate (RHEUMATREX) 2.5 MG tablet, Take 25 mg by mouth once a week. Pt takes on Monday., Disp: , Rfl:  .  omeprazole (PRILOSEC) 20 MG capsule, Take 40 mg by mouth as needed. , Disp: , Rfl:  .  polyethylene glycol (MIRALAX / GLYCOLAX) packet, Take by mouth as needed. , Disp: , Rfl:  .  tizanidine (ZANAFLEX) 2 MG capsule, Take by mouth 3 (three) times daily. , Disp: , Rfl:  .  traZODone (DESYREL) 50 MG tablet, Take 50 mg by mouth at bedtime., Disp: , Rfl:   ROS  Constitutional: Denies any fever or chills Gastrointestinal: No reported hemesis, hematochezia, vomiting, or acute GI distress Musculoskeletal: Denies any acute onset joint swelling, redness, loss of ROM, or weakness Neurological: No reported episodes of acute onset apraxia, aphasia, dysarthria, agnosia, amnesia, paralysis, loss of coordination, or loss of consciousness  Allergies  Mr. Buenger is allergic to cefoperazone; penicillins; and latex.  PFSH  Drug: Mr. Hakim  has no drug history on file. Alcohol:  reports that he does not drink alcohol. Tobacco:  reports that he has quit smoking. His smoking use included Cigarettes. He has a 20.00 pack-year smoking history. He has never used smokeless tobacco. Medical:  has a past medical history of Allergy; Arthritis; Hypertension; Rheumatoid arthritis (HCC); Seizures (HCC); and Sepsis ([facility]CC) (05/23/2015). Surgical: Mr. Lamboy  has a past surgical history that includes back surgery five times (04/2016); right hip  replacement; knee replacement right; Hernia repair; Spine surgery; Joint replacement; rotator cuff surgery; and Carpal tunnel release. Family: family history includes Dementia in his father and mother; Hypertension in his mother and other.  Constitutional Exam  General appearance: Well nourished, well developed, and well hydrated. In no apparent acute distress There were no vitals filed for this visit. BMI Assessment: Estimated body mass index is 30.52 kg/m as calculated from the following:   Height as of 01/14/17: 6' (1.829 m).   Weight as of 01/14/17: 225 lb (102.1 kg).  BMI interpretation table: BMI level Category Range association with higher incidence of chronic pain  <18 kg/m2 Underweight   18.5-24.9 kg/m2 Ideal body weight   25-29.9 kg/m2 Overweight Increased incidence by 20%  30-34.9 kg/m2 Obese (Class I)  Increased incidence by 68%  35-39.9 kg/m2 Severe obesity (Class II) Increased incidence by 136%  >40 kg/m2 Extreme obesity (Class III) Increased incidence by 254%   BMI Readings from Last 4 Encounters:  01/14/17 30.52 kg/m  05/23/15 30.28 kg/m   Wt Readings from Last 4 Encounters:  01/14/17 225 lb (102.1 kg)  05/23/15 229 lb 8 oz (104.1 kg)  Psych/Mental status: Alert, oriented x 3 (person, place, & time)       Eyes: PERLA Respiratory: No evidence of acute respiratory distress  Cervical Spine Area Exam  Skin & Axial Inspection: No masses, redness, edema, swelling, or associated skin lesions Alignment: Symmetrical Functional ROM: Unrestricted ROM      Stability: No instability detected Muscle Tone/Strength: Functionally intact. No obvious neuro-muscular anomalies detected. Sensory (Neurological): Unimpaired Palpation: No palpable anomalies              Upper Extremity (UE) Exam    Side: Right upper extremity  Side: Left upper extremity  Skin & Extremity Inspection: Skin color, temperature, and hair growth are WNL. No peripheral edema or cyanosis. No masses, redness,  swelling, asymmetry, or associated skin lesions. No contractures.  Skin & Extremity Inspection: Skin color, temperature, and hair growth are WNL. No peripheral edema or cyanosis. No masses, redness, swelling, asymmetry, or associated skin lesions. No contractures.  Functional ROM: Unrestricted ROM          Functional ROM: Unrestricted ROM          Muscle Tone/Strength: Functionally intact. No obvious neuro-muscular anomalies detected.  Muscle Tone/Strength: Functionally intact. No obvious neuro-muscular anomalies detected.  Sensory (Neurological): Unimpaired          Sensory (Neurological): Unimpaired          Palpation: No palpable anomalies              Palpation: No palpable anomalies              Specialized Test(s): Deferred         Specialized Test(s): Deferred          Thoracic Spine Area Exam  Skin & Axial Inspection: No masses, redness, or swelling Alignment: Symmetrical Functional ROM: Unrestricted ROM Stability: No instability detected Muscle Tone/Strength: Functionally intact. No obvious neuro-muscular anomalies detected. Sensory (Neurological): Unimpaired Muscle strength & Tone: No palpable anomalies  Lumbar Spine Area Exam  Skin & Axial Inspection: No masses, redness, or swelling Alignment: Symmetrical Functional ROM: Unrestricted ROM      Stability: No instability detected Muscle Tone/Strength: Functionally intact. No obvious neuro-muscular anomalies detected. Sensory (Neurological): Unimpaired Palpation: No palpable anomalies       Provocative Tests: Lumbar Hyperextension and rotation test: evaluation deferred today       Lumbar Lateral bending test: evaluation deferred today       Patrick's Maneuver: evaluation deferred today                    Gait & Posture Assessment  Ambulation: Unassisted Gait: Relatively normal for age and body habitus Posture: WNL   Lower Extremity Exam    Side: Right lower extremity  Side: Left lower extremity  Skin & Extremity Inspection:  Skin color, temperature, and hair growth are WNL. No peripheral edema or cyanosis. No masses, redness, swelling, asymmetry, or associated skin lesions. No contractures.  Skin & Extremity Inspection: Skin color, temperature, and hair growth are WNL. No peripheral edema or cyanosis. No masses, redness, swelling, asymmetry, or associated skin lesions. No contractures.  Functional ROM:  Unrestricted ROM          Functional ROM: Unrestricted ROM          Muscle Tone/Strength: Functionally intact. No obvious neuro-muscular anomalies detected.  Muscle Tone/Strength: Functionally intact. No obvious neuro-muscular anomalies detected.  Sensory (Neurological): Unimpaired  Sensory (Neurological): Unimpaired  Palpation: No palpable anomalies  Palpation: No palpable anomalies   Assessment & Plan  Primary Diagnosis & Pertinent Problem List: The primary encounter diagnosis was Chronic low back pain (Primary Area of Pain) (Bilateral) (R>L). 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  Visit Diagnosis: 1. Chronic low back pain (Primary Area of Pain) (Bilateral) (R>L)   2. Chronic lower extremity pain (Secondary Area of Pain) (Bilateral) (R>L)   3. Failed back surgical syndrome   4. DDD (degenerative disc disease), lumbar   5. Spondylosis of lumbar spine   6. Chronic knee pain (Right)   7. 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 Grade 1 Retrolisthesis of L1 over L2   9. T11 wedge compression fracture   10. Lumbar facet arthropathy   11. Lumbar facet syndrome (Bilateral)   12. Lumbar foraminal stenosis   13. 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 Chronic pain syndrome   15. Disorder of skeletal system   16. Pharmacologic therapy   17. 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)  Chronic Pain Syndrome  Lumbar Radiculopathy  Chronic lower extremity pain (Secondary Area of Pain) (Bilateral) (R>L)  Rheumatoid Arthritis (H[facility])  Chronic low back pain (Primary Area of Pain) (Bilateral) (R>L)  Disorder of Skeletal System  Pharmacologic Therapy  Problems Influencing Health Status  Long Term Current Use of Opiate Analgesic  Opiate Use  Gastroesophageal Reflux Disease  Hyperlipidemia  Postoperative Anemia Due to Acute Blood Loss  Postprocedural Hypotension  Type II Or Unspecified Type Diabetes Mellitus Without Mention of Complication, Not Stated As Uncontrolled  Benign Essential Hypertension  Prostatitis  Seizure ([facility])  Left Bundle Branch Block  Sepsis ([facility]) (Resolved)    Plan of Care  Pharmacotherapy (Medications Ordered): No orders of the defined types were placed in this encounter.  Procedure Orders    No procedure(s) ordered today   Lab Orders  No laboratory test(s) ordered today   Imaging Orders  No imaging studies ordered today   Referral Orders  No referral(s) requested today     Pharmacological management options:  Opioid Analgesics: We'll take over management today. See above orders Membrane stabilizer: We have discussed the possibility of optimizing this mode of therapy, if tolerated Muscle relaxant: We have discussed the  possibility of a trial NSAID: We have discussed the possibility of a trial Other analgesic(s): To be determined at a later time   Interventional management options: Planned, scheduled, and/or pending:    ***   Considering:   Diagnostic T10-11 thoracic epidural steroid injection Diagnostic bilateral L1-2 transforaminal epidural steroid injection Diagnostic right-sided L3-4 and L4-5 transforaminal epidural steroid injections Diagnostic bilateral lumbar facet block Possible bilateral lumbar facet RFA  Diagnostic right Genicular nerve block  Possible right Genicular nerve RFA  Possible Spinal cord stimulator versus intrathecal pump trial    PRN Procedures:   None at this time   Provider-requested follow-up: No Follow-up on file.  Future Appointments Date Time Provider [place]  02/20/2017 9:30 AM Milinda Pointer, MD [facility] None    Primary Care Physician: Derinda Late, MD Location: [facility] Note by: Gaspar Cola, MD Date: 02/20/2017; Time: 2:31 PM

## 2017-02-20 ENCOUNTER — Ambulatory Visit: Payer: Medicare Other | Attending: Pain Medicine | Admitting: Pain Medicine

## 2017-02-26 NOTE — Progress Notes (Signed)
Patient's Name: Jeremiah [name].  MRN: [id number]  Referring Provider: Derinda Late, MD  DOB: 12-Dec-1940  PCP: Derinda Late, MD  DOS: 02/27/2017  Note by: Gaspar Cola, MD  Service setting: Ambulatory outpatient  Specialty: Interventional Pain Management  Location: [facility] (AMB) Pain Management Facility    Patient type: Established   Primary Reason(s) for Visit: Encounter for evaluation before starting new chronic pain management plan of care (Level of risk: moderate) CC: Back Pain (lower) and Leg Pain (both legs, more on right than left)  HPI  Mr. Jeremiah [name] is a 76 y.o. year old, male patient, who comes today for a follow-up evaluation to review the test results and decide on a treatment plan. He has Chronic pain syndrome; Benign essential hypertension; Gastroesophageal reflux disease; Hyperlipidemia; Left bundle branch block; Chronic low back pain (Primary Area of Pain) (Bilateral) (R>L); Lumbar radiculopathy; Postoperative anemia due to acute blood loss; Postprocedural hypotension; Prostatitis; Rheumatoid arthritis ([place]); Seizure ([place]); Type II or unspecified type diabetes mellitus without mention of complication, not stated as uncontrolled; Chronic lower extremity pain (Secondary Area of Pain) (Bilateral) (R>L); Failed back surgical syndrome x 5; DDD (degenerative disc disease), lumbar; Spondylosis of lumbar spine; Chronic knee pain (B) (R>L); 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 His primarily concern today is the Back Pain (lower) and Leg Pain (both legs, more on right than left)  Pain Assessment: Location: Lower Back Radiating: down both legs,back of leg, more on the right than left all the way to the top of the foot and at times goes to the toes Onset: More than a month ago Duration: Chronic pain Quality: Aching, Constant, Radiating, Sharp, Shooting, Numbness ("some numbness to the leg, some neuropath") Severity: 4 /10 (self-reported pain score)  Note: Reported level is compatible with observation.                   When using our objective Pain Scale, levels between 6 and 10/10 are said to belong in an emergency room, as it progressively worsens from a 6/10, described as severely limiting, requiring emergency care not usually available at an outpatient pain management facility. At a 6/10 level, communication becomes difficult and requires great effort. Assistance to reach the emergency department may be required. Facial flushing and profuse sweating along with potentially dangerous increases in heart rate and blood pressure will be evident. Effect on ADL: pace self Timing: Constant Modifying factors: procedure and medicine, stretching and water therapy  Mr. Jeremiah [name] comes in today for a follow-up visit after his initial evaluation on 02/20/2017. Today we went over the results of his tests. These were explained in "Layman's terms". During today's appointment we went over my diagnostic impression, as well as the proposed treatment plan.  According to the patient's primary area pain is in his lower back. He admits this is related to an injury that he suffered 08/26/1991. He admits this is a worker's  comp related injury. He suffered a  ground-level fall on concrete. He is status post 5 lumbar spine surgeries.  His last surgery was 05/01/2016 for revision. His initial surgery was in the 1980s. He admits the right side is greater than the left. He feels like the pain radiates up the incision site. He describes as a constant pain is worse with standing and walking.  He was last seen by Dr. Diamantina Providence, neurosurgery clinic referred him here.  His second area of pain is in his lower extremities. He admits that the right is greater than the left. He admits the pain goes down the side of his leg radiates into his calf but stops at the ankle. He has some numbness which she states is worse since his last surgery. He admits that he does drag his right foot on occasion.  He is currently using a TENS unit, aqua therapy for pain management.   In considering the treatment plan options, [name] was reminded that I no longer take patients for medication management only. I asked him to let me know if he had no intention of taking advantage of the interventional therapies, so that we could make arrangements to provide this space to someone interested. I also made it clear that undergoing interventional therapies for the purpose of getting pain medications is very inappropriate on the part of a patient, and it will not be tolerated in this practice. This type of behavior would suggest true addiction and therefore it requires referral to an addiction specialist.    Topics covered today: [name] primary cause of pain, the results of his recent test(s), the significance of each one oth the test(s) anomalies and it's corresponding characteristic pain pattern(s), the treatment plan, treatment alternatives and the need to collect and read the AVS material.   Further details on both, my assessment(s), as well as the proposed treatment plan, please see below. [name] is extremely difficult to read. Based on my interview today, it would seem that he is not  really interested in any controlled substances and is also not interested in any big interventions. When asked, he indicated that he was just interested in getting trigger points whenever he needed them. He has been complaining about muscle cramps today I have given him some pointers as to things that he can do and take over-the-counter so as to control this. He was instructed to take vitamin D 2000 international units + calcium [phone] mg + magnesium 500 mg. In addition I recommended that he take a glass of tonic water at bedtime to get some benefit from that quinine. In addition to this, I also recommended that he take 8 ounces of Gatorade with each meal and a multivitamin. He was very happy with these recommendations which I wrote down for him.  Controlled Substance Pharmacotherapy Assessment REMS (Risk Evaluation and Mitigation Strategy)  Analgesic: none Highest recorded MME/day: 30 mg/day MME/day: 0 mg/day. Pill Count: None expected due to no prior prescriptions written by our practice. Lona Millard, RN  02/27/2017 11:12 AM  Sign at close encounter Safety precautions to be maintained throughout the outpatient stay will include: orient to surroundings, keep bed in low position, maintain call bell within reach at all times, provide assistance with transfer out of bed and ambulation.    Pharmacokinetics: Liberation and absorption (onset of action): WNL Distribution (time to peak effect): WNL Metabolism and excretion (duration of action): WNL         Pharmacodynamics: Desired effects:  Analgesia: Mr. Spinney reports >50% benefit. Functional ability: Patient reports that medication allows him to accomplish basic ADLs Clinically meaningful improvement in function (CMIF): Sustained CMIF goals met Perceived effectiveness: Described as relatively effective, allowing for increase in activities of daily living (ADL) Undesirable effects: Side-effects or Adverse reactions: None  reported Monitoring: [place] PMP: Online review of the past 30-monthperiod previously conducted. Not applicable at this point since we have not taken over the patient's medication management yet. List of all Serum Drug Screening Test(s):  No results found for: AMPHSCRSER, BARBSCRSER, BENZOSCRSER, COCAINSCRSER, PCPSCRSER, THCSCRSER, OPIATESCRSER, [place] of [place] UDS test(s) done:  Lab Results  Component Value Date   SUMMARY FINAL 01/14/2017   Last UDS on record: Summary  Date Value Ref Range Status  01/14/2017 FINAL  Final    Comment:    ==================================================================== TOXASSURE COMP DRUG ANALYSIS,UR ==================================================================== Test                             Result       Flag       Units Drug Present and Declared for Prescription Verification   Lamotrigine                    PRESENT      EXPECTED Drug Absent but Declared for Prescription Verification   Tizanidine                     Not Detected UNEXPECTED    Tizanidine, as indicated in the declared medication list, is not    always detected even when used as directed.   Trazodone                      Not Detected UNEXPECTED   Salicylate                     Not Detected UNEXPECTED    Aspirin, as indicated in the declared medication list, is not    always detected even when used as directed. ==================================================================== Test                      Result    Flag   Units      Ref Range   Creatinine              50               mg/dL      >=20 ==================================================================== Declared Medications:  The flagging and interpretation on this report are based on the  following declared medications.  Unexpected results may arise from  inaccuracies in the declared medications.  **Note: The testing scope of this panel includes these medications:  Lamotrigine (Lamictal)   Trazodone (Desyrel)  **Note: The testing scope of this panel does not include small to  moderate amounts of these reported medications:  Aspirin (Aspirin 81)  Tizanidine (Zanaflex)  **Note: The testing scope of this panel does not include following  reported medications:  Clindamycin  Etanercept (Enbrel)  Folic acid (Folvite)  Hydrochlorothiazide (Hyzaar)  Hydroxychloroquine (Plaquenil)  Losartan (Hyzaar)  Meloxicam (Mobic)  Methotrexate  Omeprazole (Prilosec)  Polyethylene Glycol (MiraLAX)  Vitamin B (Super B Complex) ==================================================================== For clinical consultation, please call [phone] ====================================================================    UDS interpretation: No unexpected findings.          Medication Assessment Form: Not applicable. Treatment compliance: Not applicable  Risk Assessment Profile: Aberrant behavior: See initial evaluations. None observed or detected today Comorbid factors increasing risk of overdose: See initial evaluation. No additional risks detected today Medical Psychology Evaluation: Please see scanned results in medical record.     Opioid Risk Tool - 02/27/17 1108      Family History of Substance Abuse   Alcohol Negative   Illegal Drugs Negative   Rx Drugs Negative     Personal History of Substance Abuse   Alcohol Negative   Illegal Drugs Negative   Rx Drugs Negative     Age   Age between 64-45 years  No     History of Preadolescent Sexual Abuse   History of Preadolescent Sexual Abuse Negative or Male     Psychological Disease   Psychological Disease Negative   Depression Negative     Total Score   Opioid Risk Tool Scoring 0   Opioid Risk Interpretation Low Risk     ORT Scoring interpretation table:  Score <3 = Low Risk for SUD  Score between 4-7 = Moderate Risk for SUD  Score >8 = High Risk for Opioid Abuse   Risk Mitigation Strategies:  Patient opioid safety  counseling:  Not applicable. Patient-Prescriber Agreement (PPA): No agreement signed.  Controlled substance notification to other providers: Not applicable  Pharmacologic Plan: Mr. Holderness has voiced his wishes to stay away from opiates.             Laboratory Chemistry  Inflammation Markers (CRP: Acute Phase) (ESR: Chronic Phase) Lab Results  Component Value Date   CRP 37.5 (H) 01/14/2017   ESRSEDRATE 7 01/14/2017                 Renal Function Markers Lab Results  Component Value Date   BUN 11 01/14/2017   CREATININE 0.80 01/14/2017   GFRAA 101 01/14/2017   GFRNONAA 87 01/14/2017                 Hepatic Function Markers Lab Results  Component Value Date   AST 22 01/14/2017   ALT 14 01/14/2017   ALBUMIN 3.8 01/14/2017   ALKPHOS 96 01/14/2017                 Electrolytes Lab Results  Component Value Date   NA 133 (L) 01/14/2017   K 4.4 01/14/2017   CL 95 (L) 01/14/2017   CALCIUM 9.3 01/14/2017   MG 1.8 01/14/2017                 Neuropathy Markers Lab Results  Component Value Date   VITAMINB12 995 01/14/2017                 Bone Pathology Markers Lab Results  Component Value Date   ALKPHOS 96 01/14/2017   25OHVITD1 28 (L) 01/14/2017   25OHVITD2 <1.0 01/14/2017   25OHVITD3 28 01/14/2017   CALCIUM 9.3 01/14/2017                 Coagulation Parameters Lab Results  Component Value Date   PLT 147 (L) 05/25/2015                 Cardiovascular Markers Lab Results  Component Value Date   HGB 10.6 (L) 05/25/2015   HCT 31.3 (L) 05/25/2015                 Note: Lab results reviewed and explained to patient in Layman's terms.  Recent Diagnostic Imaging Review  Lumbosacral Imaging: Lumbar  DG 2-3 views:  Results for orders placed in visit on 06/16/13  DG Lumbar Spine 2-3 Views   Narrative * PRIOR REPORT IMPORTED FROM AN EXTERNAL SYSTEM *   CLINICAL DATA:  Instability   EXAM:  LUMBAR SPINE - 2-3 VIEW   COMPARISON:  None.   FINDINGS:  There is  slight retrolisthesis L1 upon L2 with severe narrowing.  Disc spacers are present at L2-3 and L3-4. Bilateral pedicle screws  and cross-table as in bar is are seen from L2 through L4. Prior  fusion from L4 through S1 through the posterior elements is  suspected. Osteopenia. No definite acute fracture. Mild anterior  wedging at T11 has a chronic appearance. No breakage or obvious  loosening of the hardware. Osteopenia.   IMPRESSION:  No acute bony pathology. Chronic and postoperative changes are  noted.    Electronically Signed    By: Maryclare Bean M.D.    On: 06/16/2013 17:23       Knee Imaging: Knee-R DG 1-2 views:  Results for orders placed in visit on 07/17/07  DG Knee 1-2 Views Right   Narrative * PRIOR REPORT IMPORTED FROM AN EXTERNAL SYSTEM *   PRIOR REPORT IMPORTED FROM THE [employer] WORKFLOW SYSTEM   REASON FOR EXAM:    Post-operative  COMMENTS:   Bedside (portable):Y   PROCEDURE:     DXR - DXR KNEE RIGHT AP AND LATERAL  - Jul 17 2007  4:21PM   RESULT:     Patellofemoral and femorotibial degenerative change is  present.  Staples are noted over the RIGHT knee. Surgical tubing is noted over the  RIGHT knee.   IMPRESSION:     The patient is status post total RIGHT knee replacement.  Good anatomic alignment is noted.   Thank you for this opportunity to contribute to the care of your patient.       Complexity Note: Imaging results reviewed. Results shared with Mr. Manley, using Layman's terms.                         Meds   Current Outpatient Prescriptions:  .  aspirin 81 MG chewable tablet, Chew 81 mg by mouth daily., Disp: , Rfl:  .  B Complex Vitamins (VITAMIN-B COMPLEX) TABS, Take by mouth daily. , Disp: , Rfl:  .  Cinnamon 500 MG capsule, Take 500 mg by mouth 2 (two) times daily., Disp: , Rfl:  .  clindamycin (CLEOCIN) 150 MG capsule, Take 150 mg by mouth. , Disp: , Rfl:  .  etanercept (ENBREL SURECLICK) 50 MG/ML injection, Inject 50 mg into the skin once a week.  , Disp: , Rfl:  .  folic acid (FOLVITE) 109 MCG tablet, Take 800 mcg by mouth daily., Disp: , Rfl:  .  hydroxychloroquine (PLAQUENIL) 200 MG tablet, Take 400 mg by mouth at bedtime., Disp: , Rfl:  .  lamoTRIgine (LAMICTAL) 200 MG tablet, Take 200 mg by mouth 2 (two) times daily., Disp: , Rfl:  .  losartan-hydrochlorothiazide (HYZAAR) 50-12.5 MG tablet, Take 1 tablet by mouth daily., Disp: , Rfl:  .  meloxicam (MOBIC) 15 MG tablet, Take 15 mg by mouth daily. , Disp: , Rfl:  .  methotrexate (RHEUMATREX) 2.5 MG tablet, Take 25 mg by mouth once a week. Pt takes on Monday., Disp: , Rfl:  .  omeprazole (PRILOSEC) 20 MG capsule, Take 40 mg by mouth as needed. , Disp: , Rfl:  .  traZODone (DESYREL) 50 MG  tablet, Take 50 mg by mouth at bedtime., Disp: , Rfl:   ROS  Constitutional: Denies any fever or chills Gastrointestinal: No reported hemesis, hematochezia, vomiting, or acute GI distress Musculoskeletal: Denies any acute onset joint swelling, redness, loss of ROM, or weakness Neurological: No reported episodes of acute onset apraxia, aphasia, dysarthria, agnosia, amnesia, paralysis, loss of coordination, or loss of consciousness  Allergies  Mr. Iden is allergic to cefoperazone; penicillins; and latex.  PFSH  Drug: Mr. Steinke  has no drug history on file. Alcohol:  reports that he does not drink alcohol. Tobacco:  reports that he has quit smoking. His smoking use included Cigarettes. He has a 20.00 pack-year smoking history. He has never used smokeless tobacco. Medical:  has a past medical history of Allergy; Arthritis; Hypertension; Rheumatoid arthritis (HCC); Seizures (HCC); and Sepsis (HCC) (05/23/2015). Surgical: Mr. Klimowicz  has a past surgical history that includes back surgery five times (04/2016); right hip replacement; knee replacement right; Hernia repair; Spine surgery; Joint replacement; rotator cuff surgery; and Carpal tunnel release. Family: family history includes Dementia in his father  and mother; Hypertension in his mother and other.  Constitutional Exam  General appearance: Well nourished, well developed, and well hydrated. In no apparent acute distress Vitals:   02/27/17 1053  BP: 120/71  Pulse: 88  Resp: 16  Temp: 98.4 F (36.9 C)  SpO2: 99%  Weight: 230 lb (104.3 kg)  Height: 6' (1.829 m)   BMI Assessment: Estimated body mass index is 31.19 kg/m as calculated from the following:   Height as of this encounter: 6' (1.829 m).   Weight as of this encounter: 230 lb (104.3 kg).  BMI interpretation table: BMI level Category Range association with higher incidence of chronic pain  <18 kg/m2 Underweight   18.5-24.9 kg/m2 Ideal body weight   25-29.9 kg/m2 Overweight Increased incidence by 20%  30-34.9 kg/m2 Obese (Class I) Increased incidence by 68%  35-39.9 kg/m2 Severe obesity (Class II) Increased incidence by 136%  >40 kg/m2 Extreme obesity (Class III) Increased incidence by 254%   BMI Readings from Last 4 Encounters:  02/27/17 31.19 kg/m  01/14/17 30.52 kg/m  05/23/15 30.28 kg/m   Wt Readings from Last 4 Encounters:  02/27/17 230 lb (104.3 kg)  01/14/17 225 lb (102.1 kg)  05/23/15 229 lb 8 oz (104.1 kg)  Psych/Mental status: Alert, oriented x 3 (person, place, & time)       Eyes: PERLA Respiratory: No evidence of acute respiratory distress  Cervical Spine Area Exam  Skin & Axial Inspection: No masses, redness, edema, swelling, or associated skin lesions Alignment: Symmetrical Functional ROM: Unrestricted ROM      Stability: No instability detected Muscle Tone/Strength: Functionally intact. No obvious neuro-muscular anomalies detected. Sensory (Neurological): Unimpaired Palpation: No palpable anomalies              Upper Extremity (UE) Exam    Side: Right upper extremity  Side: Left upper extremity  Skin & Extremity Inspection: Skin color, temperature, and hair growth are WNL. No peripheral edema or cyanosis. No masses, redness, swelling,  asymmetry, or associated skin lesions. No contractures.  Skin & Extremity Inspection: Skin color, temperature, and hair growth are WNL. No peripheral edema or cyanosis. No masses, redness, swelling, asymmetry, or associated skin lesions. No contractures.  Functional ROM: Unrestricted ROM          Functional ROM: Unrestricted ROM          Muscle Tone/Strength: Functionally intact. No obvious neuro-muscular anomalies detected.  Muscle Tone/Strength: Functionally intact. No obvious neuro-muscular anomalies detected.  Sensory (Neurological): Unimpaired          Sensory (Neurological): Unimpaired          Palpation: No palpable anomalies              Palpation: No palpable anomalies              Specialized Test(s): Deferred         Specialized Test(s): Deferred          Thoracic Spine Area Exam  Skin & Axial Inspection: No masses, redness, or swelling Alignment: Symmetrical Functional ROM: Unrestricted ROM Stability: No instability detected Muscle Tone/Strength: Functionally intact. No obvious neuro-muscular anomalies detected. Sensory (Neurological): Unimpaired Muscle strength & Tone: No palpable anomalies  Lumbar Spine Area Exam  Skin & Axial Inspection: Well healed scar from previous spine surgery detected Alignment: Symmetrical Functional ROM: Limited ROM      Stability: No instability detected Muscle Tone/Strength: Functionally intact. No obvious neuro-muscular anomalies detected. Sensory (Neurological): Movement-associated pain Palpation: Complains of area being tender to palpation       Provocative Tests: Lumbar Hyperextension and rotation test: evaluation deferred today       Lumbar Lateral bending test: evaluation deferred today       Patrick's Maneuver: evaluation deferred today                    Gait & Posture Assessment  Ambulation: Patient ambulates using a cane Gait: Limited. Using assistive device to ambulate Posture: Antalgic   Lower Extremity Exam    Side: Right lower  extremity  Side: Left lower extremity  Skin & Extremity Inspection: Skin color, temperature, and hair growth are WNL. No peripheral edema or cyanosis. No masses, redness, swelling, asymmetry, or associated skin lesions. No contractures.  Skin & Extremity Inspection: Skin color, temperature, and hair growth are WNL. No peripheral edema or cyanosis. No masses, redness, swelling, asymmetry, or associated skin lesions. No contractures.  Functional ROM: Unrestricted ROM          Functional ROM: Unrestricted ROM          Muscle Tone/Strength: Functionally intact. No obvious neuro-muscular anomalies detected.  Muscle Tone/Strength: Functionally intact. No obvious neuro-muscular anomalies detected.  Sensory (Neurological): Unimpaired  Sensory (Neurological): Unimpaired  Palpation: No palpable anomalies  Palpation: No palpable anomalies   Assessment & Plan  Primary Diagnosis & Pertinent Problem List: The primary encounter diagnosis was Chronic low back pain (Primary Area of Pain) (Bilateral) (R>L). 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  Visit Diagnosis: 1. Chronic low back pain (Primary Area of Pain) (Bilateral) (R>L)   2. Lumbar facet arthropathy   3. Lumbar facet syndrome (Bilateral)   4. T11 wedge compression fracture   5. DDD (degenerative  disc disease), lumbar   6. Failed back surgical syndrome   7. Grade 1 Retrolisthesis of L1 over L2   8. 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 Spondylosis of lumbar spine   10. 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 Chronic lower extremity pain (Secondary Area of Pain) (Bilateral) (R>L)   12. Lumbar radiculopathy   13. 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 Chronic knee pain (Right)   15. Chronic musculoskeletal pain   16. Myofascial pain syndrome   17. Long term current use of anticoagulant (Plaquenil)   18. Chronic lumbar radiculitis (L5 dermatome) (Right)   19. Chronic lumbar radiculitis (L3 dermatome) (Left)   20. Chronic foot pain (Right)   21. Chronic thoracic back pain (Bilateral) (R>L)   22. Chronic neck pain (posterior) (Bilateral) (R>L)   23. Chronic shoulder pain (Bilateral) (L>R)    Problems updated and reviewed during this visit: Problem  Chronic Musculoskeletal Pain  Myofascial Pain Syndrome  Chronic lumbar radiculitis (L5 dermatome) (Right)  Chronic lumbar radiculitis (L3 dermatome) (Left)  Chronic foot pain (Right)  Chronic thoracic back pain (Bilateral) (R>L)  Chronic neck pain (posterior) (Bilateral) (R>L)  Chronic shoulder pain (Bilateral) (L>R)  Failed back surgical syndrome x 5  Chronic knee pain (B) (R>L)  T11 wedge compression fracture (Aprox. 1993)   Thoracic trauma. Fell on cement floor.   Long term current use of anticoagulant (Plaquenil)    Plan of Care  Pharmacotherapy (Medications Ordered): No orders of the defined types were placed in this encounter.   Procedure Orders     TRIGGER POINT INJECTION Lab Orders  No laboratory test(s) ordered  today   Imaging Orders  No imaging studies ordered today   Referral Orders  No referral(s) requested today    Pharmacological management options:  Opioid Analgesics: We'll take over management today. See above orders Membrane stabilizer: We have discussed the possibility of optimizing this mode of therapy, if tolerated Muscle relaxant: We have discussed the possibility of a trial NSAID: We have discussed the possibility of a trial Other analgesic(s): To be determined at a later time   Interventional management options: Planned, scheduled, and/or pending:    None at this time.    Considering:   Diagnostic/palliative trigger point injections of the lumbosacral and thoracolumbar spine Diagnostic bilateral lumbar facet block  Possible bilateral lumbar facet RFA  Diagnostic caudal epidural steroid injection + diagnostic epidurogram Possible Racz procedure Diagnostic thoracic epidural steroid injection  Diagnostic right-sided L3-4 and L4-5 transforaminal epidural steroid injection  Diagnostic bilateral L1-2 transforaminal epidural steroid injection  Possible spinal cord stimulator trial    PRN Procedures:   Palliative trigger points of the lumbar spine.    Provider-requested follow-up: Return for PRN Procedure: Trigger Point injections.  No future appointments.  Primary Care Physician: Derinda Late, MD Location: [facility] Facility Note by: Gaspar Cola, MD Date: 02/27/2017; Time: 1:47 PM

## 2017-02-27 ENCOUNTER — Encounter: Payer: Self-pay | Admitting: Pain Medicine

## 2017-02-27 ENCOUNTER — Ambulatory Visit: Payer: Worker's Compensation | Attending: Pain Medicine | Admitting: Pain Medicine

## 2017-02-27 VITALS — BP 120/71 | HR 88 | Temp 98.4°F | Resp 16 | Ht 72.0 in | Wt 230.0 lb

## 2017-02-27 DIAGNOSIS — M5136 Other intervertebral disc degeneration, lumbar region: Secondary | ICD-10-CM

## 2017-02-27 DIAGNOSIS — M069 Rheumatoid arthritis, unspecified: Secondary | ICD-10-CM | POA: Insufficient documentation

## 2017-02-27 DIAGNOSIS — M79604 Pain in right leg: Secondary | ICD-10-CM

## 2017-02-27 DIAGNOSIS — S22080A Wedge compression fracture of T11-T12 vertebra, initial encounter for closed fracture: Secondary | ICD-10-CM | POA: Insufficient documentation

## 2017-02-27 DIAGNOSIS — Z7982 Long term (current) use of aspirin: Secondary | ICD-10-CM | POA: Insufficient documentation

## 2017-02-27 DIAGNOSIS — M5116 Intervertebral disc disorders with radiculopathy, lumbar region: Secondary | ICD-10-CM | POA: Diagnosis not present

## 2017-02-27 DIAGNOSIS — M4804 Spinal stenosis, thoracic region: Secondary | ICD-10-CM | POA: Insufficient documentation

## 2017-02-27 DIAGNOSIS — Z96641 Presence of right artificial hip joint: Secondary | ICD-10-CM | POA: Insufficient documentation

## 2017-02-27 DIAGNOSIS — M47816 Spondylosis without myelopathy or radiculopathy, lumbar region: Secondary | ICD-10-CM | POA: Diagnosis not present

## 2017-02-27 DIAGNOSIS — R569 Unspecified convulsions: Secondary | ICD-10-CM | POA: Insufficient documentation

## 2017-02-27 DIAGNOSIS — X58XXXA Exposure to other specified factors, initial encounter: Secondary | ICD-10-CM | POA: Diagnosis not present

## 2017-02-27 DIAGNOSIS — M4310 Spondylolisthesis, site unspecified: Secondary | ICD-10-CM

## 2017-02-27 DIAGNOSIS — M25561 Pain in right knee: Secondary | ICD-10-CM

## 2017-02-27 DIAGNOSIS — Z96651 Presence of right artificial knee joint: Secondary | ICD-10-CM | POA: Diagnosis not present

## 2017-02-27 DIAGNOSIS — M546 Pain in thoracic spine: Secondary | ICD-10-CM

## 2017-02-27 DIAGNOSIS — M79671 Pain in right foot: Secondary | ICD-10-CM | POA: Diagnosis not present

## 2017-02-27 DIAGNOSIS — E785 Hyperlipidemia, unspecified: Secondary | ICD-10-CM | POA: Diagnosis not present

## 2017-02-27 DIAGNOSIS — M542 Cervicalgia: Secondary | ICD-10-CM | POA: Diagnosis not present

## 2017-02-27 DIAGNOSIS — I10 Essential (primary) hypertension: Secondary | ICD-10-CM | POA: Insufficient documentation

## 2017-02-27 DIAGNOSIS — G894 Chronic pain syndrome: Secondary | ICD-10-CM | POA: Diagnosis present

## 2017-02-27 DIAGNOSIS — M5442 Lumbago with sciatica, left side: Secondary | ICD-10-CM

## 2017-02-27 DIAGNOSIS — M545 Low back pain: Secondary | ICD-10-CM | POA: Diagnosis not present

## 2017-02-27 DIAGNOSIS — M961 Postlaminectomy syndrome, not elsewhere classified: Secondary | ICD-10-CM

## 2017-02-27 DIAGNOSIS — F329 Major depressive disorder, single episode, unspecified: Secondary | ICD-10-CM | POA: Insufficient documentation

## 2017-02-27 DIAGNOSIS — M4854XS Collapsed vertebra, not elsewhere classified, thoracic region, sequela of fracture: Secondary | ICD-10-CM | POA: Diagnosis not present

## 2017-02-27 DIAGNOSIS — M5441 Lumbago with sciatica, right side: Secondary | ICD-10-CM

## 2017-02-27 DIAGNOSIS — M7918 Myalgia, other site: Secondary | ICD-10-CM | POA: Insufficient documentation

## 2017-02-27 DIAGNOSIS — Z7901 Long term (current) use of anticoagulants: Secondary | ICD-10-CM | POA: Insufficient documentation

## 2017-02-27 DIAGNOSIS — I447 Left bundle-branch block, unspecified: Secondary | ICD-10-CM | POA: Insufficient documentation

## 2017-02-27 DIAGNOSIS — Z87891 Personal history of nicotine dependence: Secondary | ICD-10-CM | POA: Diagnosis not present

## 2017-02-27 DIAGNOSIS — M25511 Pain in right shoulder: Secondary | ICD-10-CM

## 2017-02-27 DIAGNOSIS — M9983 Other biomechanical lesions of lumbar region: Secondary | ICD-10-CM

## 2017-02-27 DIAGNOSIS — M48061 Spinal stenosis, lumbar region without neurogenic claudication: Secondary | ICD-10-CM | POA: Diagnosis not present

## 2017-02-27 DIAGNOSIS — E119 Type 2 diabetes mellitus without complications: Secondary | ICD-10-CM | POA: Diagnosis not present

## 2017-02-27 DIAGNOSIS — Z79899 Other long term (current) drug therapy: Secondary | ICD-10-CM | POA: Insufficient documentation

## 2017-02-27 DIAGNOSIS — G8929 Other chronic pain: Secondary | ICD-10-CM | POA: Insufficient documentation

## 2017-02-27 DIAGNOSIS — K219 Gastro-esophageal reflux disease without esophagitis: Secondary | ICD-10-CM | POA: Diagnosis not present

## 2017-02-27 DIAGNOSIS — M5416 Radiculopathy, lumbar region: Secondary | ICD-10-CM

## 2017-02-27 DIAGNOSIS — M25512 Pain in left shoulder: Secondary | ICD-10-CM

## 2017-02-27 DIAGNOSIS — M79605 Pain in left leg: Secondary | ICD-10-CM

## 2017-02-27 NOTE — Progress Notes (Signed)
Safety precautions to be maintained throughout the outpatient stay will include: orient to surroundings, keep bed in low position, maintain call bell within reach at all times, provide assistance with transfer out of bed and ambulation.  

## 2017-02-27 NOTE — Patient Instructions (Signed)
____________________________________________________________________________________________  Preparing for your procedure (without sedation) Instructions: . Oral Intake: Do not eat or drink anything for at least 3 hours prior to your procedure. . Transportation: Unless otherwise stated by your physician, you may drive yourself after the procedure. . Blood Pressure Medicine: Take your blood pressure medicine with a sip of water the morning of the procedure. . Blood thinners:  . Diabetics on insulin: Notify the staff so that you can be scheduled 1st case in the morning. If your diabetes requires high dose insulin, take only  of your normal insulin dose the morning of the procedure and notify the staff that you have done so. . Preventing infections: Shower with an antibacterial soap the morning of your procedure.  . Build-up your immune system: Take 1000 mg of Vitamin C with every meal (3 times a day) the day prior to your procedure. . Antibiotics: Inform the staff if you have a condition or reason that requires you to take antibiotics before dental procedures. . Pregnancy: If you are pregnant, call and cancel the procedure. . Sickness: If you have a cold, fever, or any active infections, call and cancel the procedure. . Arrival: You must be in the facility at least 30 minutes prior to your scheduled procedure. . Children: Do not bring any children with you. . Dress appropriately: Bring dark clothing that you would not mind if they get stained. . Valuables: Do not bring any jewelry or valuables. Procedure appointments are reserved for interventional treatments only. . No Prescription Refills. . No medication changes will be discussed during procedure appointments. . No disability issues will be discussed. ____________________________________________________________________________________________   

## 2017-07-17 ENCOUNTER — Encounter: Payer: Self-pay | Admitting: Pain Medicine

## 2017-07-17 ENCOUNTER — Other Ambulatory Visit: Payer: Self-pay

## 2017-07-17 ENCOUNTER — Ambulatory Visit: Payer: Worker's Compensation | Attending: Pain Medicine | Admitting: Pain Medicine

## 2017-07-17 VITALS — BP 137/80 | HR 77 | Temp 97.9°F | Resp 16 | Ht 72.0 in | Wt 230.0 lb

## 2017-07-17 DIAGNOSIS — M4854XS Collapsed vertebra, not elsewhere classified, thoracic region, sequela of fracture: Secondary | ICD-10-CM | POA: Diagnosis not present

## 2017-07-17 DIAGNOSIS — Z7982 Long term (current) use of aspirin: Secondary | ICD-10-CM | POA: Insufficient documentation

## 2017-07-17 DIAGNOSIS — X58XXXA Exposure to other specified factors, initial encounter: Secondary | ICD-10-CM | POA: Diagnosis not present

## 2017-07-17 DIAGNOSIS — I10 Essential (primary) hypertension: Secondary | ICD-10-CM | POA: Insufficient documentation

## 2017-07-17 DIAGNOSIS — M47819 Spondylosis without myelopathy or radiculopathy, site unspecified: Secondary | ICD-10-CM | POA: Diagnosis not present

## 2017-07-17 DIAGNOSIS — K219 Gastro-esophageal reflux disease without esophagitis: Secondary | ICD-10-CM | POA: Diagnosis not present

## 2017-07-17 DIAGNOSIS — S22080A Wedge compression fracture of T11-T12 vertebra, initial encounter for closed fracture: Secondary | ICD-10-CM | POA: Diagnosis not present

## 2017-07-17 DIAGNOSIS — G894 Chronic pain syndrome: Secondary | ICD-10-CM | POA: Insufficient documentation

## 2017-07-17 DIAGNOSIS — M79671 Pain in right foot: Secondary | ICD-10-CM | POA: Diagnosis not present

## 2017-07-17 DIAGNOSIS — G8929 Other chronic pain: Secondary | ICD-10-CM

## 2017-07-17 DIAGNOSIS — Z79899 Other long term (current) drug therapy: Secondary | ICD-10-CM | POA: Diagnosis not present

## 2017-07-17 DIAGNOSIS — E119 Type 2 diabetes mellitus without complications: Secondary | ICD-10-CM | POA: Insufficient documentation

## 2017-07-17 DIAGNOSIS — M5136 Other intervertebral disc degeneration, lumbar region: Secondary | ICD-10-CM

## 2017-07-17 DIAGNOSIS — M5441 Lumbago with sciatica, right side: Secondary | ICD-10-CM

## 2017-07-17 DIAGNOSIS — M79605 Pain in left leg: Secondary | ICD-10-CM | POA: Diagnosis not present

## 2017-07-17 DIAGNOSIS — M7918 Myalgia, other site: Secondary | ICD-10-CM | POA: Diagnosis not present

## 2017-07-17 DIAGNOSIS — M79604 Pain in right leg: Secondary | ICD-10-CM

## 2017-07-17 DIAGNOSIS — M5116 Intervertebral disc disorders with radiculopathy, lumbar region: Secondary | ICD-10-CM | POA: Insufficient documentation

## 2017-07-17 DIAGNOSIS — I447 Left bundle-branch block, unspecified: Secondary | ICD-10-CM | POA: Insufficient documentation

## 2017-07-17 DIAGNOSIS — M47817 Spondylosis without myelopathy or radiculopathy, lumbosacral region: Secondary | ICD-10-CM | POA: Insufficient documentation

## 2017-07-17 DIAGNOSIS — Z87891 Personal history of nicotine dependence: Secondary | ICD-10-CM | POA: Insufficient documentation

## 2017-07-17 DIAGNOSIS — I9581 Postprocedural hypotension: Secondary | ICD-10-CM | POA: Diagnosis not present

## 2017-07-17 DIAGNOSIS — M549 Dorsalgia, unspecified: Secondary | ICD-10-CM | POA: Diagnosis not present

## 2017-07-17 DIAGNOSIS — M48061 Spinal stenosis, lumbar region without neurogenic claudication: Secondary | ICD-10-CM | POA: Diagnosis not present

## 2017-07-17 DIAGNOSIS — M961 Postlaminectomy syndrome, not elsewhere classified: Secondary | ICD-10-CM

## 2017-07-17 DIAGNOSIS — M5442 Lumbago with sciatica, left side: Secondary | ICD-10-CM

## 2017-07-17 DIAGNOSIS — Z880 Allergy status to penicillin: Secondary | ICD-10-CM | POA: Insufficient documentation

## 2017-07-17 DIAGNOSIS — M545 Low back pain: Secondary | ICD-10-CM | POA: Diagnosis present

## 2017-07-17 NOTE — Patient Instructions (Signed)
Referring to [facility].

## 2017-07-17 NOTE — Progress Notes (Signed)
Safety precautions to be maintained throughout the outpatient stay will include: orient to surroundings, keep bed in low position, maintain call bell within reach at all times, provide assistance with transfer out of bed and ambulation.  

## 2017-07-17 NOTE — Progress Notes (Signed)
Patient's Name: Jeremiah [name].  MRN: [id number]  Referring Provider: Derinda Late, MD  DOB: 01-22-1941  PCP: Derinda Late, MD  DOS: 07/17/2017  Note by: Gaspar Cola, MD  Service setting: Ambulatory outpatient  Specialty: Interventional Pain Management  Location: [facility] (AMB) Pain Management Facility    Patient type: Established   Primary Reason(s) for Visit: Evaluation of chronic illnesses with exacerbation, or progression (Level of risk: moderate) CC: Back Pain (low)  HPI  Jeremiah [name] is a 77 y.o. year old, male patient, who comes today for a follow-up evaluation. He has Chronic pain syndrome; Benign essential hypertension; Gastroesophageal reflux disease; Hyperlipidemia; Left bundle branch block; Chronic low back pain (Primary Area of Pain) (Bilateral) (R>L); Lumbar radiculopathy; Postoperative anemia due to acute blood loss; Postprocedural hypotension; Prostatitis; Rheumatoid arthritis ([place]); Seizure ([place]); Type II or unspecified type diabetes mellitus without mention of complication, not stated as uncontrolled; Chronic lower extremity pain (Secondary Area of Pain) (Bilateral) (R>L); Failed back surgical syndrome x 5; DDD (degenerative disc disease), lumbar; Spondylosis of lumbar spine; Chronic knee pain (B) (R>L); 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); Disorder of skeletal system; Pharmacologic therapy; Problems influencing health status; Chronic musculoskeletal pain; Myofascial pain syndrome; Long term current use of anticoagulant (Plaquenil); Chronic lumbar radiculitis (L5 dermatome) (Right); Chronic lumbar radiculitis (L3 dermatome) (Left); Chronic foot pain (Right); Chronic thoracic back pain (Bilateral) (R>L);  Chronic neck pain (posterior) (Bilateral) (R>L); Chronic shoulder pain (Bilateral) (L>R); Spondylosis without myelopathy or radiculopathy, lumbosacral region; and Trigger point with back pain (recurrent/intermittent) on their problem list. Jeremiah [name] was last seen on 02/27/2017. His primarily concern today is the Back Pain (low)  Pain Assessment: Location: Lower Back Radiating: radiates upward to between shoulders Onset: More than a month ago Duration: Chronic pain Quality: Stabbing, Nagging Severity: 5 /10 (self-reported pain score)  Note: Reported level is inconsistent with clinical observations. Clinically the patient looks like a 1/10 A 1/10 is viewed as "Mild" and described as nagging, annoying, but not interfering with basic activities of daily living (ADL). Jeremiah [name] is able to eat, bathe, get dressed, do toileting (being able to get on and off the toilet and perform personal hygiene functions), transfer (move in and out of bed or a chair without assistance), and maintain continence (able to control bladder and bowel functions). Physiologic parameters such as blood pressure and heart rate apear wnl.       When using our objective Pain Scale, levels between 6 and 10/10 are said to belong in an emergency room, as it progressively worsens from a 6/10, described as severely limiting, requiring emergency care not usually available at an outpatient pain management facility. At a 6/10 level, communication becomes difficult and requires great effort. Assistance to reach the emergency department may be required. Facial flushing and profuse sweating along with potentially dangerous increases in heart rate and blood pressure will be evident. Timing: Constant Modifying factors: rest  EMG done on 11/26/2016.  The patient indicates currently not having any significant pain and he says that he would like to try some physical therapy.  I went ahead and gave him a referral to [facility] since he  indicates that he has been there in the past and it is conveniently located for him.  I have  explained to the patient that we will not be doing repetitive referrals to physical therapy, since this would not be appropriate for a chronic pain condition.  However, I am willing to send him to have physical therapy, at least once, so that they can work with him and provide him with information on a "home work program" that he can continue doing on his own, for the rest of his life.  The patient was in complete agreement with this.  At this point, he refers that he does not feel that he needs any type of interventional therapies and were also not prescribing any medications for him.  However, we have pointed out that should he begin to have some issues with his previously treated trigger points, we will be more than happy to address those and repeat the trigger points.  Further details on both, my assessment(s), as well as the proposed treatment plan, please see below.  Laboratory Chemistry  Inflammation Markers (CRP: Acute Phase) (ESR: Chronic Phase) Lab Results  Component Value Date   CRP 37.5 (H) 01/14/2017   ESRSEDRATE 7 01/14/2017   LATICACIDVEN 1.2 05/24/2015                         Renal Function Markers Lab Results  Component Value Date   BUN 11 01/14/2017   CREATININE 0.80 01/14/2017   GFRAA 101 01/14/2017   GFRNONAA 87 01/14/2017                 Hepatic Function Markers Lab Results  Component Value Date   AST 22 01/14/2017   ALT 14 01/14/2017   ALBUMIN 3.8 01/14/2017   ALKPHOS 96 01/14/2017                 Electrolytes Lab Results  Component Value Date   NA 133 (L) 01/14/2017   K 4.4 01/14/2017   CL 95 (L) 01/14/2017   CALCIUM 9.3 01/14/2017   MG 1.8 01/14/2017                        Neuropathy Markers Lab Results  Component Value Date   VITAMINB12 995 01/14/2017                 Bone Pathology Markers Lab Results  Component Value Date   25OHVITD1 28 (L)  01/14/2017   25OHVITD2 <1.0 01/14/2017   25OHVITD3 28 01/14/2017                         Coagulation Parameters Lab Results  Component Value Date   PLT 147 (L) 05/25/2015                 Cardiovascular Markers Lab Results  Component Value Date   TROPONINI <0.03 05/23/2015   HGB 10.6 (L) 05/25/2015   HCT 31.3 (L) 05/25/2015                 Note: Lab results reviewed.  Recent Diagnostic Imaging Review   Complexity Note: No results found under the [facility] electronic medical record.                         Meds   Current Outpatient Medications:  .  aspirin 81 MG chewable tablet, Chew 81 mg by mouth daily., Disp: , Rfl:  .  Cinnamon 500 MG capsule, Take 500 mg by mouth 2 (two) times  daily., Disp: , Rfl:  .  etanercept (ENBREL SURECLICK) 50 MG/ML injection, Inject 50 mg into the skin once a week. , Disp: , Rfl:  .  folic acid (FOLVITE) 250 MCG tablet, Take 800 mcg by mouth daily., Disp: , Rfl:  .  hydroxychloroquine (PLAQUENIL) 200 MG tablet, Take 400 mg by mouth at bedtime., Disp: , Rfl:  .  lamoTRIgine (LAMICTAL) 200 MG tablet, Take 200 mg by mouth 2 (two) times daily., Disp: , Rfl:  .  losartan-hydrochlorothiazide (HYZAAR) 50-12.5 MG tablet, Take 1 tablet by mouth daily., Disp: , Rfl:  .  magnesium oxide (MAG-OX) 400 MG tablet, Take 400 mg by mouth daily., Disp: , Rfl:  .  meloxicam (MOBIC) 15 MG tablet, Take 15 mg by mouth daily. , Disp: , Rfl:  .  methotrexate (RHEUMATREX) 2.5 MG tablet, Take 25 mg by mouth once a week. Pt takes on Monday., Disp: , Rfl:  .  omeprazole (PRILOSEC) 20 MG capsule, Take 40 mg by mouth as needed. , Disp: , Rfl:  .  traZODone (DESYREL) 50 MG tablet, Take 50 mg by mouth at bedtime., Disp: , Rfl:   ROS  Constitutional: Denies any fever or chills Gastrointestinal: No reported hemesis, hematochezia, vomiting, or acute GI distress Musculoskeletal: Denies any acute onset joint swelling, redness, loss of ROM, or weakness Neurological: No  reported episodes of acute onset apraxia, aphasia, dysarthria, agnosia, amnesia, paralysis, loss of coordination, or loss of consciousness  Allergies  Mr. Seidner is allergic to cefoperazone; penicillins; and latex.  PFSH  Drug: Mr. Dikes  has no drug history on file. Alcohol:  reports that he does not drink alcohol. Tobacco:  reports that he has quit smoking. His smoking use included cigarettes. He has a 20.00 pack-year smoking history. he has never used smokeless tobacco. Medical:  has a past medical history of Allergy, Arthritis, Hypertension, Rheumatoid arthritis ([place]), Seizures ([place]), and Sepsis ([place]) (05/23/2015). Surgical: Mr. Bhagat  has a past surgical history that includes back surgery five times (04/2016); right hip replacement; knee replacement right; Hernia repair; Spine surgery; Joint replacement; rotator cuff surgery; and Carpal tunnel release. Family: family history includes Dementia in his father and mother; Hypertension in his mother and other.  Constitutional Exam  General appearance: Well nourished, well developed, and well hydrated. In no apparent acute distress Vitals:   07/17/17 0952  BP: 137/80  Pulse: 77  Resp: 16  Temp: 97.9 F (36.6 C)  SpO2: 99%  Weight: 230 lb (104.3 kg)  Height: 6' (1.829 m)   BMI Assessment: Estimated body mass index is 31.19 kg/m as calculated from the following:   Height as of this encounter: 6' (1.829 m).   Weight as of this encounter: 230 lb (104.3 kg).  BMI interpretation table: BMI level Category Range association with higher incidence of chronic pain  <18 kg/m2 Underweight   18.5-24.9 kg/m2 Ideal body weight   25-29.9 kg/m2 Overweight Increased incidence by 20%  30-34.9 kg/m2 Obese (Class I) Increased incidence by 68%  35-39.9 kg/m2 Severe obesity (Class II) Increased incidence by 136%  >40 kg/m2 Extreme obesity (Class III) Increased incidence by 254%   BMI Readings from Last 4 Encounters:  07/17/17 31.19 kg/m  02/27/17  31.19 kg/m  01/14/17 30.52 kg/m  05/23/15 30.28 kg/m   Wt Readings from Last 4 Encounters:  07/17/17 230 lb (104.3 kg)  02/27/17 230 lb (104.3 kg)  01/14/17 225 lb (102.1 kg)  05/23/15 229 lb 8 oz (104.1 kg)  Psych/Mental status: Alert, oriented  x 3 (person, place, & time)       Eyes: PERLA Respiratory: No evidence of acute respiratory distress  Cervical Spine Area Exam  Skin & Axial Inspection: No masses, redness, edema, swelling, or associated skin lesions Alignment: Symmetrical Functional ROM: Unrestricted ROM      Stability: No instability detected Muscle Tone/Strength: Functionally intact. No obvious neuro-muscular anomalies detected. Sensory (Neurological): Unimpaired Palpation: No palpable anomalies              Upper Extremity (UE) Exam    Side: Right upper extremity  Side: Left upper extremity  Skin & Extremity Inspection: Skin color, temperature, and hair growth are WNL. No peripheral edema or cyanosis. No masses, redness, swelling, asymmetry, or associated skin lesions. No contractures.  Skin & Extremity Inspection: Skin color, temperature, and hair growth are WNL. No peripheral edema or cyanosis. No masses, redness, swelling, asymmetry, or associated skin lesions. No contractures.  Functional ROM: Unrestricted ROM          Functional ROM: Unrestricted ROM          Muscle Tone/Strength: Functionally intact. No obvious neuro-muscular anomalies detected.  Muscle Tone/Strength: Functionally intact. No obvious neuro-muscular anomalies detected.  Sensory (Neurological): Unimpaired          Sensory (Neurological): Unimpaired          Palpation: No palpable anomalies              Palpation: No palpable anomalies              Specialized Test(s): Deferred         Specialized Test(s): Deferred          Thoracic Spine Area Exam  Skin & Axial Inspection: No masses, redness, or swelling Alignment: Symmetrical Functional ROM: Unrestricted ROM Stability: No instability  detected Muscle Tone/Strength: Functionally intact. No obvious neuro-muscular anomalies detected. Sensory (Neurological): Unimpaired Muscle strength & Tone: No palpable anomalies  Lumbar Spine Area Exam  Skin & Axial Inspection: No masses, redness, or swelling Alignment: Symmetrical Functional ROM: Unrestricted ROM      Stability: No instability detected Muscle Tone/Strength: Functionally intact. No obvious neuro-muscular anomalies detected. Sensory (Neurological): Unimpaired Palpation: No palpable anomalies       Provocative Tests: Lumbar Hyperextension and rotation test: evaluation deferred today       Lumbar Lateral bending test: evaluation deferred today       Patrick's Maneuver: evaluation deferred today                    Gait & Posture Assessment  Ambulation: Patient ambulates using a cane Gait: Age-related, senile gait pattern Posture: WNL   Lower Extremity Exam    Side: Right lower extremity  Side: Left lower extremity  Skin & Extremity Inspection: Skin color, temperature, and hair growth are WNL. No peripheral edema or cyanosis. No masses, redness, swelling, asymmetry, or associated skin lesions. No contractures.  Skin & Extremity Inspection: Skin color, temperature, and hair growth are WNL. No peripheral edema or cyanosis. No masses, redness, swelling, asymmetry, or associated skin lesions. No contractures.  Functional ROM: Unrestricted ROM          Functional ROM: Unrestricted ROM          Muscle Tone/Strength: Functionally intact. No obvious neuro-muscular anomalies detected.  Muscle Tone/Strength: Functionally intact. No obvious neuro-muscular anomalies detected.  Sensory (Neurological): Unimpaired  Sensory (Neurological): Unimpaired  Palpation: No palpable anomalies  Palpation: No palpable anomalies   Assessment  Primary Diagnosis & Pertinent  Problem List: The primary encounter diagnosis was Chronic low back pain (Primary Area of Pain) (Bilateral) (R>L). Diagnoses of  Chronic lower extremity pain (Secondary Area of Pain) (Bilateral) (R>L), DDD (degenerative disc disease), lumbar, Spondylosis without myelopathy or radiculopathy, lumbosacral region, Myofascial pain syndrome, Trigger point with back pain (recurrent/intermittent), Failed back surgical syndrome x 5, T11 wedge compression fracture (Aprox. 1993), Chronic musculoskeletal pain, and Gastroesophageal reflux disease without esophagitis were also pertinent to this visit.  Status Diagnosis  Controlled Controlled Controlled 1. Chronic low back pain (Primary Area of Pain) (Bilateral) (R>L)   2. Chronic lower extremity pain (Secondary Area of Pain) (Bilateral) (R>L)   3. DDD (degenerative disc disease), lumbar   4. Spondylosis without myelopathy or radiculopathy, lumbosacral region   5. Myofascial pain syndrome   6. Trigger point with back pain (recurrent/intermittent)   7. Failed back surgical syndrome x 5   8. T11 wedge compression fracture (Aprox. 1993)   9. Chronic musculoskeletal pain   10. Gastroesophageal reflux disease without esophagitis     Problems updated and reviewed during this visit: Problem  Spondylosis Without Myelopathy Or Radiculopathy, Lumbosacral Region  Trigger point with back pain (recurrent/intermittent)  Failed back surgical syndrome x 5   1. 2014: TRANSFORAMINAL LUMBAR INTERBODY FUSION L2-3, L3-4 WITH BONE MARROW ASPIRATE 2. 2017: TRANSFORAMINAL LUMBAR INTERBODY FUSION L4-5, BILATERAL POSTERIOR STABILIZATION AND POSTEROLATERAL ARTHRODESIS L1-S1 WITH HARDWARE REVISION L2-4 Both performed by Dr. Evelena Peat - RETIRED 3. 93-94 - L4-5 uninstrumented fusion with decompression 4. Early 80s - unclear decompression versus microdiscectomy    Plan of Care  Pharmacotherapy (Medications Ordered): No orders of the defined types were placed in this encounter.  Medications administered today: Joneen Boers B. Rondel Jumbo. had no medications administered during this visit.  Procedure Orders     No procedure(s) ordered today   Lab Orders  No laboratory test(s) ordered today   Imaging Orders  No imaging studies ordered today    Referral Orders     Ambulatory referral to Physical Therapy  Interventional management options: Planned, scheduled, and/or pending:   PRN Diagnostic/palliative trigger point injections of the lumbosacral and thoracolumbar spine    Considering:   Diagnostic/palliative trigger point injections of the lumbosacral and thoracolumbar spine Diagnostic bilateral lumbar facet block  Possible bilateral lumbar facet RFA  Diagnostic caudal epidural steroid injection + diagnostic epidurogram Possible Racz procedure Diagnostic thoracic epidural steroid injection  Diagnostic right-sided L3-4 and L4-5 transforaminal epidural steroid injection  Diagnostic bilateral L1-2 transforaminal epidural steroid injection  Possible SCS trial    Palliative PRN treatment(s):   Palliative trigger points of the lumbar spine.    Provider-requested follow-up: Return for PRN Procedure: TPI ([facility]NB).  No future appointments. Primary Care Physician: Derinda Late, MD Location: [facility] Facility Note by: Gaspar Cola, MD Date: 07/17/2017; Time: 5:59 PM

## 2017-07-30 ENCOUNTER — Telehealth: Payer: Self-pay

## 2017-07-30 NOTE — Telephone Encounter (Signed)
Obtained order from Dr. Dossie Arbour and gave to [place] to fax.

## 2017-07-30 NOTE — Telephone Encounter (Signed)
Dr. Dossie Arbour ordered PT for this patient in the form of one visit and a home work program. Workers Production assistant, radio called me this morning saying she received a request from PT for aqua therapy. If Dr. Dossie Arbour wants the patient to have water therapy he needs to write a script for it so I can fax it to the adjuster. If he does not want the water therapy please let me know so I can inform the adjuster. Thanks

## 2017-08-13 ENCOUNTER — Other Ambulatory Visit: Payer: Self-pay | Admitting: Nurse Practitioner

## 2017-08-13 ENCOUNTER — Telehealth: Payer: Self-pay | Admitting: Pain Medicine

## 2017-08-13 DIAGNOSIS — M5441 Lumbago with sciatica, right side: Principal | ICD-10-CM

## 2017-08-13 DIAGNOSIS — M5442 Lumbago with sciatica, left side: Principal | ICD-10-CM

## 2017-08-13 DIAGNOSIS — G8929 Other chronic pain: Secondary | ICD-10-CM

## 2017-08-13 NOTE — Telephone Encounter (Signed)
completed

## 2017-08-13 NOTE — Telephone Encounter (Signed)
Jeremiah [name] from [facility] calling about patient, would like an updated script sent to [facility])  for PT. Please call [place] if any questions [phone]

## 2017-08-13 NOTE — Telephone Encounter (Signed)
Left voicemail with the number provided to please provide [place] with additional information.

## 2017-09-03 ENCOUNTER — Telehealth: Payer: Self-pay

## 2017-09-03 NOTE — Telephone Encounter (Signed)
The adjusters name is Hall Busing. Her phone number is [phone]

## 2017-09-03 NOTE — Telephone Encounter (Signed)
Done

## 2017-09-03 NOTE — Telephone Encounter (Signed)
The patients workers comp Astronomer requesting that Dr. Dossie Arbour write a script for [place] for the patient. Please let me know if this is something he will do.

## 2017-09-30 ENCOUNTER — Telehealth: Payer: Self-pay | Admitting: Nurse Practitioner

## 2017-09-30 NOTE — Telephone Encounter (Signed)
Patient called asking about refill for gabapentin, does he need appt?

## 2017-10-01 NOTE — Telephone Encounter (Signed)
I do not see where we have prescribed Gabapentin for this patient therefore he will need to make an appointment.

## 2017-10-01 NOTE — Progress Notes (Signed)
Patient's Name: Jeremiah [name].  MRN: [id number]  Referring Provider: Derinda Late, MD  DOB: 05-15-1941  PCP: Derinda Late, MD  DOS: 10/02/2017  Note by: Gaspar Cola, MD  Service setting: Ambulatory outpatient  Specialty: Interventional Pain Management  Location: [facility] (AMB) Pain Management Facility    Patient type: Established   Primary Reason(s) for Visit: Evaluation of chronic illnesses with exacerbation, or progression (Level of risk: moderate) CC: Back Pain (lower)  HPI  Jeremiah [name] is a 77 y.o. year old, male patient, who comes today for a follow-up evaluation. He has Chronic pain syndrome; Benign essential hypertension; Gastroesophageal reflux disease; Hyperlipidemia; Left bundle branch block; Chronic low back pain (Primary Area of Pain) (Bilateral) (R>L); Lumbar radiculopathy; Postoperative anemia due to acute blood loss; Postprocedural hypotension; Prostatitis; Rheumatoid arthritis ([place]); Seizure ([place]); Type II or unspecified type diabetes mellitus without mention of complication, not stated as uncontrolled; Chronic lower extremity pain (Secondary Area of Pain) (Bilateral) (R>L); Failed back surgical syndrome x 5; DDD (degenerative disc disease), lumbar; Spondylosis of lumbar spine; Chronic knee pain (B) (R>L); 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); Disorder of skeletal system; Pharmacologic therapy; Problems influencing health status; Chronic musculoskeletal pain; Myofascial pain syndrome; Long term current use of anticoagulant (Plaquenil); Chronic lumbar radiculitis (L5 dermatome) (Right); Chronic lumbar radiculitis (L3 dermatome) (Left); Chronic foot pain (Right); Chronic thoracic back pain (Bilateral)  (R>L); Chronic neck pain (posterior) (Bilateral) (R>L); Chronic shoulder pain (Bilateral) (L>R); Spondylosis without myelopathy or radiculopathy, lumbosacral region; Trigger point with back pain (recurrent/intermittent); Dorsalgia; and Neurogenic pain on their problem list. Jeremiah [name] was last seen on 08/13/2017. His primarily concern today is the Back Pain (lower)  Pain Assessment: Location: Lower Back Radiating: Radiates down both hip, the right hip down to right leg Onset: More than a month ago Duration: Chronic pain Quality: Aching, Constant, Numbness, Stabbing Severity: 5 /10 (subjective, self-reported pain score)  Note: Reported level is inconsistent with clinical observations. Clinically the patient looks like a 1/10 A 1/10 is viewed as "Mild" and described as nagging, annoying, but not interfering with basic activities of daily living (ADL). Jeremiah [name] is able to eat, bathe, get dressed, do toileting (being able to get on and off the toilet and perform personal hygiene functions), transfer (move in and out of bed or a chair without assistance), and maintain continence (able to control bladder and bowel functions). Physiologic parameters such as blood pressure and heart rate apear wnl. Information on the proper use of the pain scale provided to the patient today. When using our objective Pain Scale, levels between 6 and 10/10 are said to belong in an emergency room, as it progressively worsens from a 6/10, described as severely limiting, requiring emergency care not usually available at an outpatient pain management facility. At a 6/10 level, communication becomes difficult and requires great effort. Assistance to reach the emergency department may be required. Facial flushing and profuse sweating along with potentially dangerous increases in heart rate and blood pressure will be evident. Timing: Constant Modifying factors: tens unit, meds BP: (!) 144/81  HR: 83  Further details on both, my  assessment(s), as well as the proposed treatment plan, please see below.  Laboratory Chemistry  Inflammation Markers (CRP: Acute Phase) (ESR: Chronic Phase) Lab Results  Component  Value Date   CRP 37.5 (H) 01/14/2017   ESRSEDRATE 7 01/14/2017   LATICACIDVEN 1.2 05/24/2015                         Renal Function Markers Lab Results  Component Value Date   BUN 11 01/14/2017   CREATININE 0.80 01/14/2017   GFRAA 101 01/14/2017   GFRNONAA 87 01/14/2017                              Hepatic Function Markers Lab Results  Component Value Date   AST 22 01/14/2017   ALT 14 01/14/2017   ALBUMIN 3.8 01/14/2017   ALKPHOS 96 01/14/2017                        Electrolytes Lab Results  Component Value Date   NA 133 (L) 01/14/2017   K 4.4 01/14/2017   CL 95 (L) 01/14/2017   CALCIUM 9.3 01/14/2017   MG 1.8 01/14/2017                        Neuropathy Markers Lab Results  Component Value Date   VITAMINB12 995 01/14/2017                        Bone Pathology Markers Lab Results  Component Value Date   25OHVITD1 28 (L) 01/14/2017   25OHVITD2 <1.0 01/14/2017   25OHVITD3 28 01/14/2017                         Coagulation Parameters Lab Results  Component Value Date   PLT 147 (L) 05/25/2015                        Cardiovascular Markers Lab Results  Component Value Date   TROPONINI <0.03 05/23/2015   HGB 10.6 (L) 05/25/2015   HCT 31.3 (L) 05/25/2015                         Note: Lab results reviewed.  Recent Diagnostic Imaging Review   Complexity Note: No results found under the [facility] electronic medical record.                         Meds   Current Outpatient Medications:  .  aspirin 81 MG chewable tablet, Chew 81 mg by mouth daily., Disp: , Rfl:  .  Cinnamon 500 MG capsule, Take 500 mg by mouth 2 (two) times daily., Disp: , Rfl:  .  etanercept (ENBREL SURECLICK) 50 MG/ML injection, Inject 50 mg into the skin once a week. , Disp: , Rfl:  .  folic acid  (FOLVITE) 604 MCG tablet, Take 800 mcg by mouth daily., Disp: , Rfl:  .  hydroxychloroquine (PLAQUENIL) 200 MG tablet, Take 400 mg by mouth at bedtime., Disp: , Rfl:  .  lamoTRIgine (LAMICTAL) 200 MG tablet, Take 200 mg by mouth 2 (two) times daily., Disp: , Rfl:  .  losartan-hydrochlorothiazide (HYZAAR) 50-12.5 MG tablet, Take 1 tablet by mouth daily., Disp: , Rfl:  .  magnesium oxide (MAG-OX) 400 MG tablet, Take 400 mg by mouth daily., Disp: , Rfl:  .  meloxicam (MOBIC) 15 MG tablet, Take 15 mg by mouth daily. , Disp: ,  Rfl:  .  methotrexate (RHEUMATREX) 2.5 MG tablet, Take 25 mg by mouth once a week. Pt takes on Monday., Disp: , Rfl:  .  omeprazole (PRILOSEC) 20 MG capsule, Take 40 mg by mouth as needed. , Disp: , Rfl:  .  traZODone (DESYREL) 50 MG tablet, Take 50 mg by mouth at bedtime., Disp: , Rfl:  .  gabapentin (NEURONTIN) 100 MG capsule, Take 1-3 capsules (100-300 mg total) by mouth at bedtime. Follow written titration schedule., Disp: 90 capsule, Rfl: 0  ROS  Constitutional: Denies any fever or chills Gastrointestinal: No reported hemesis, hematochezia, vomiting, or acute GI distress Musculoskeletal: Denies any acute onset joint swelling, redness, loss of ROM, or weakness Neurological: No reported episodes of acute onset apraxia, aphasia, dysarthria, agnosia, amnesia, paralysis, loss of coordination, or loss of consciousness  Allergies  Mr. Hoard is allergic to cefoperazone; penicillins; and latex.  PFSH  Drug: Mr. Razzano  has no drug history on file. Alcohol:  reports that he does not drink alcohol. Tobacco:  reports that he has quit smoking. His smoking use included cigarettes. He has a 20.00 pack-year smoking history. He has never used smokeless tobacco. Medical:  has a past medical history of Allergy, Arthritis, Hypertension, Rheumatoid arthritis ([place]), Seizures ([place]), and Sepsis ([place]) (05/23/2015). Surgical: Mr. Nestle  has a past surgical history that includes back surgery five  times (04/2016); right hip replacement; knee replacement right; Hernia repair; Spine surgery; Joint replacement; rotator cuff surgery; and Carpal tunnel release. Family: family history includes Dementia in his father and mother; Hypertension in his mother and other.  Constitutional Exam  General appearance: Well nourished, well developed, and well hydrated. In no apparent acute distress Vitals:   10/02/17 1033  BP: (!) 144/81  Pulse: 83  Temp: 98 F (36.7 C)  SpO2: 100%  Weight: 230 lb (104.3 kg)  Height: 6' (1.829 m)   BMI Assessment: Estimated body mass index is 31.19 kg/m as calculated from the following:   Height as of this encounter: 6' (1.829 m).   Weight as of this encounter: 230 lb (104.3 kg).  BMI interpretation table: BMI level Category Range association with higher incidence of chronic pain  <18 kg/m2 Underweight   18.5-24.9 kg/m2 Ideal body weight   25-29.9 kg/m2 Overweight Increased incidence by 20%  30-34.9 kg/m2 Obese (Class I) Increased incidence by 68%  35-39.9 kg/m2 Severe obesity (Class II) Increased incidence by 136%  >40 kg/m2 Extreme obesity (Class III) Increased incidence by 254%   Patient's current BMI Ideal Body weight  Body mass index is 31.19 kg/m. Ideal body weight: 77.6 kg (171 lb 1.2 oz) Adjusted ideal body weight: 88.3 kg (194 lb 10.3 oz)   BMI Readings from Last 4 Encounters:  10/02/17 31.19 kg/m  07/17/17 31.19 kg/m  02/27/17 31.19 kg/m  01/14/17 30.52 kg/m   Wt Readings from Last 4 Encounters:  10/02/17 230 lb (104.3 kg)  07/17/17 230 lb (104.3 kg)  02/27/17 230 lb (104.3 kg)  01/14/17 225 lb (102.1 kg)  Psych/Mental status: Alert, oriented x 3 (person, place, & time)       Eyes: PERLA Respiratory: No evidence of acute respiratory distress  Cervical Spine Area Exam  Skin & Axial Inspection: No masses, redness, edema, swelling, or associated skin lesions Alignment: Symmetrical Functional ROM: Unrestricted ROM      Stability: No  instability detected Muscle Tone/Strength: Functionally intact. No obvious neuro-muscular anomalies detected. Sensory (Neurological): Unimpaired Palpation: No palpable anomalies  Upper Extremity (UE) Exam    Side: Right upper extremity  Side: Left upper extremity  Skin & Extremity Inspection: Skin color, temperature, and hair growth are WNL. No peripheral edema or cyanosis. No masses, redness, swelling, asymmetry, or associated skin lesions. No contractures.  Skin & Extremity Inspection: Skin color, temperature, and hair growth are WNL. No peripheral edema or cyanosis. No masses, redness, swelling, asymmetry, or associated skin lesions. No contractures.  Functional ROM: Unrestricted ROM          Functional ROM: Unrestricted ROM          Muscle Tone/Strength: Functionally intact. No obvious neuro-muscular anomalies detected.  Muscle Tone/Strength: Functionally intact. No obvious neuro-muscular anomalies detected.  Sensory (Neurological): Unimpaired          Sensory (Neurological): Unimpaired          Palpation: No palpable anomalies              Palpation: No palpable anomalies              Specialized Test(s): Deferred         Specialized Test(s): Deferred          Thoracic Spine Area Exam  Skin & Axial Inspection: No masses, redness, or swelling Alignment: Symmetrical Functional ROM: Unrestricted ROM Stability: No instability detected Muscle Tone/Strength: Functionally intact. No obvious neuro-muscular anomalies detected. Sensory (Neurological): Unimpaired Muscle strength & Tone: No palpable anomalies  Lumbar Spine Area Exam  Skin & Axial Inspection: No masses, redness, or swelling Alignment: Symmetrical Functional ROM: Decreased ROM       Stability: No instability detected Muscle Tone/Strength: Functionally intact. No obvious neuro-muscular anomalies detected. Sensory (Neurological): Movement-associated pain Palpation: Complains of area being tender to palpation        Provocative Tests: Lumbar Hyperextension and rotation test: Positive bilaterally for facet joint pain. Lumbar Lateral bending test: evaluation deferred today       Patrick's Maneuver: Positive for bilateral S-I arthralgia and for bilateral hip arthralgia  Gait & Posture Assessment  Ambulation: Patient ambulates using a cane Gait: Limited. Using assistive device to ambulate Posture: Difficulty standing up straight, due to pain   Lower Extremity Exam    Side: Right lower extremity  Side: Left lower extremity  Stability: No instability observed          Stability: No instability observed          Skin & Extremity Inspection: Skin color, temperature, and hair growth are WNL. No peripheral edema or cyanosis. No masses, redness, swelling, asymmetry, or associated skin lesions. No contractures.  Skin & Extremity Inspection: Skin color, temperature, and hair growth are WNL. No peripheral edema or cyanosis. No masses, redness, swelling, asymmetry, or associated skin lesions. No contractures.  Functional ROM: Decreased ROM for hip and knee joints Adequate SLR (straight leg raise) Right TKR & THR  Functional ROM: Decreased ROM for hip and knee joints Adequate SLR (straight leg raise)  Muscle Tone/Strength: Functionally intact. No obvious neuro-muscular anomalies detected.  Muscle Tone/Strength: Functionally intact. No obvious neuro-muscular anomalies detected.  Sensory (Neurological): Unimpaired  Sensory (Neurological): Unimpaired  Palpation: No palpable anomalies  Palpation: No palpable anomalies   Assessment  Primary Diagnosis & Pertinent Problem List: The primary encounter diagnosis was Chronic low back pain (Primary Area of Pain) (Bilateral) (R>L). Diagnoses of Chronic lower extremity pain (Secondary Area of Pain) (Bilateral) (R>L), Chronic musculoskeletal pain, Failed back surgical syndrome x 5, Myofascial pain syndrome, Neurogenic pain, and Lumbar facet syndrome (Bilateral) were also  pertinent to  this visit.  Status Diagnosis  Controlled Controlled Controlled 1. Chronic low back pain (Primary Area of Pain) (Bilateral) (R>L)   2. Chronic lower extremity pain (Secondary Area of Pain) (Bilateral) (R>L)   3. Chronic musculoskeletal pain   4. Failed back surgical syndrome x 5   5. Myofascial pain syndrome   6. Neurogenic pain   7. 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 gabapentin (NEURONTIN) 100 MG capsule    Sig: Take 1-3 capsules (100-300 mg total) by mouth at bedtime. Follow written titration schedule.    Dispense:  90 capsule    Refill:  0    Do not place medication on "Automatic Refill". Fill one day early if pharmacy is closed on scheduled refill date.   Medications administered today: Joneen Boers B. Rondel Jumbo. had no medications administered during this visit.   Procedure Orders     LUMBAR FACET(MEDIAL BRANCH NERVE BLOCK) [facility] Lab Orders  No laboratory test(s) ordered today   Imaging Orders  No imaging studies ordered today   Referral Orders  No referral(s) requested today    Interventional management options: Planned, scheduled, and/or pending:   Gabapentin HS trial. He will return in 30 days for evaluation on his Gabapentin titration. If tolerated, then we will move on to adding Gabapentin 100 mg during the day. We'll provide him with our titration instructions to follow. He wants to avoid interventional therapies, due to bad experiences with other practitioners doing facet injections.    Considering:   Diagnostic/palliativetrigger point injectionsof the lumbosacral and thoracolumbar spine Diagnostic bilateral lumbar facet block Possible bilateral lumbar facet RFA Diagnostic caudal  epidural steroid injection + diagnostic epidurogram Possible Racz procedure Diagnostic thoracic epidural steroid injection Diagnostic right-sided L3-4 and L4-5 transforaminal epidural steroid injection Diagnostic bilateral L1-2 transforaminal epidural steroid injection Possible SCS trial   Palliative PRN treatment(s):   Diagnostic/palliativetrigger point injectionsof the lumbosacral and thoracolumbar spine Diagnostic bilateral lumbar facet block #1 under fluoroscopy and IV sedation.    Provider-requested follow-up: Return for Med-Mgmt, w/ Dionisio David, NP, PRN Procedure: (B) L-FCT BLK #1.  Future Appointments  Date Time Provider [place]  10/29/2017  9:30 AM Vevelyn Francois, NP [facility] None   Primary Care Physician: Derinda Late, MD Location: [facility] Out[facility] Pain Management [facility] Note by: Gaspar Cola, MD Date: 10/02/2017; Time: 1:45 PM

## 2017-10-02 ENCOUNTER — Encounter: Payer: Self-pay | Admitting: Pain Medicine

## 2017-10-02 ENCOUNTER — Ambulatory Visit: Payer: No Typology Code available for payment source | Attending: Pain Medicine | Admitting: Pain Medicine

## 2017-10-02 ENCOUNTER — Other Ambulatory Visit: Payer: Self-pay

## 2017-10-02 VITALS — BP 144/81 | HR 83 | Temp 98.0°F | Ht 72.0 in | Wt 230.0 lb

## 2017-10-02 DIAGNOSIS — Z96651 Presence of right artificial knee joint: Secondary | ICD-10-CM | POA: Diagnosis not present

## 2017-10-02 DIAGNOSIS — G894 Chronic pain syndrome: Secondary | ICD-10-CM | POA: Diagnosis not present

## 2017-10-02 DIAGNOSIS — M792 Neuralgia and neuritis, unspecified: Secondary | ICD-10-CM

## 2017-10-02 DIAGNOSIS — M5116 Intervertebral disc disorders with radiculopathy, lumbar region: Secondary | ICD-10-CM | POA: Insufficient documentation

## 2017-10-02 DIAGNOSIS — M069 Rheumatoid arthritis, unspecified: Secondary | ICD-10-CM | POA: Diagnosis not present

## 2017-10-02 DIAGNOSIS — Z87891 Personal history of nicotine dependence: Secondary | ICD-10-CM | POA: Insufficient documentation

## 2017-10-02 DIAGNOSIS — M961 Postlaminectomy syndrome, not elsewhere classified: Secondary | ICD-10-CM

## 2017-10-02 DIAGNOSIS — Z7901 Long term (current) use of anticoagulants: Secondary | ICD-10-CM | POA: Diagnosis not present

## 2017-10-02 DIAGNOSIS — M48061 Spinal stenosis, lumbar region without neurogenic claudication: Secondary | ICD-10-CM | POA: Insufficient documentation

## 2017-10-02 DIAGNOSIS — I447 Left bundle-branch block, unspecified: Secondary | ICD-10-CM | POA: Diagnosis not present

## 2017-10-02 DIAGNOSIS — E119 Type 2 diabetes mellitus without complications: Secondary | ICD-10-CM | POA: Diagnosis not present

## 2017-10-02 DIAGNOSIS — M545 Low back pain: Secondary | ICD-10-CM | POA: Diagnosis present

## 2017-10-02 DIAGNOSIS — K219 Gastro-esophageal reflux disease without esophagitis: Secondary | ICD-10-CM | POA: Insufficient documentation

## 2017-10-02 DIAGNOSIS — Z79899 Other long term (current) drug therapy: Secondary | ICD-10-CM | POA: Diagnosis not present

## 2017-10-02 DIAGNOSIS — E785 Hyperlipidemia, unspecified: Secondary | ICD-10-CM | POA: Diagnosis not present

## 2017-10-02 DIAGNOSIS — Z96641 Presence of right artificial hip joint: Secondary | ICD-10-CM | POA: Insufficient documentation

## 2017-10-02 DIAGNOSIS — M4804 Spinal stenosis, thoracic region: Secondary | ICD-10-CM | POA: Insufficient documentation

## 2017-10-02 DIAGNOSIS — M5442 Lumbago with sciatica, left side: Secondary | ICD-10-CM | POA: Diagnosis not present

## 2017-10-02 DIAGNOSIS — M79604 Pain in right leg: Secondary | ICD-10-CM

## 2017-10-02 DIAGNOSIS — G8929 Other chronic pain: Secondary | ICD-10-CM

## 2017-10-02 DIAGNOSIS — M5441 Lumbago with sciatica, right side: Secondary | ICD-10-CM | POA: Diagnosis not present

## 2017-10-02 DIAGNOSIS — Z7982 Long term (current) use of aspirin: Secondary | ICD-10-CM | POA: Diagnosis not present

## 2017-10-02 DIAGNOSIS — M47816 Spondylosis without myelopathy or radiculopathy, lumbar region: Secondary | ICD-10-CM

## 2017-10-02 DIAGNOSIS — M4726 Other spondylosis with radiculopathy, lumbar region: Secondary | ICD-10-CM | POA: Insufficient documentation

## 2017-10-02 DIAGNOSIS — M79605 Pain in left leg: Secondary | ICD-10-CM | POA: Diagnosis not present

## 2017-10-02 DIAGNOSIS — M7918 Myalgia, other site: Secondary | ICD-10-CM | POA: Diagnosis not present

## 2017-10-02 DIAGNOSIS — R569 Unspecified convulsions: Secondary | ICD-10-CM | POA: Insufficient documentation

## 2017-10-02 DIAGNOSIS — I10 Essential (primary) hypertension: Secondary | ICD-10-CM | POA: Insufficient documentation

## 2017-10-02 MED ORDER — GABAPENTIN 100 MG PO CAPS: 100.0000 mg | ORAL_CAPSULE | Freq: Every day | ORAL | 0 refills | Status: DC

## 2017-10-02 NOTE — Patient Instructions (Addendum)
____________________________________________________________________________________________  Pain Scale  Introduction: The pain score used by this practice is the Verbal Numerical Rating Scale (VNRS-11). This is an 11-point scale. It is for adults and children 10 years or older. There are significant differences in how the pain score is reported, used, and applied. Forget everything you learned in the past and learn this scoring system.  General Information: The scale should reflect your current level of pain. Unless you are specifically asked for the level of your worst pain, or your average pain. If you are asked for one of these two, then it should be understood that it is over the past 24 hours.  Basic Activities of Daily Living (ADL): Personal hygiene, dressing, eating, transferring, and using restroom.  Instructions: Most patients tend to report their level of pain as a combination of two factors, their physical pain and their psychosocial pain. This last one is also known as "suffering" and it is reflection of how physical pain affects you socially and psychologically. From now on, report them separately. From this point on, when asked to report your pain level, report only your physical pain. Use the following table for reference.  Pain Clinic Pain Levels (0-5/10)  Pain Level Score  Description  No Pain 0   Mild pain 1 Nagging, annoying, but does not interfere with basic activities of daily living (ADL). Patients are able to eat, bathe, get dressed, toileting (being able to get on and off the toilet and perform personal hygiene functions), transfer (move in and out of bed or a chair without assistance), and maintain continence (able to control bladder and bowel functions). Blood pressure and heart rate are unaffected. A normal heart rate for a healthy adult ranges from 60 to 100 bpm (beats per minute).   Mild to moderate pain 2 Noticeable and distracting. Impossible to hide from other  people. More frequent flare-ups. Still possible to adapt and function close to normal. It can be very annoying and may have occasional stronger flare-ups. With discipline, patients may get used to it and adapt.   Moderate pain 3 Interferes significantly with activities of daily living (ADL). It becomes difficult to feed, bathe, get dressed, get on and off the toilet or to perform personal hygiene functions. Difficult to get in and out of bed or a chair without assistance. Very distracting. With effort, it can be ignored when deeply involved in activities.   Moderately severe pain 4 Impossible to ignore for more than a few minutes. With effort, patients may still be able to manage work or participate in some social activities. Very difficult to concentrate. Signs of autonomic nervous system discharge are evident: dilated pupils (mydriasis); mild sweating (diaphoresis); sleep interference. Heart rate becomes elevated (>115 bpm). Diastolic blood pressure (lower number) rises above 100 mmHg. Patients find relief in laying down and not moving.   Severe pain 5 Intense and extremely unpleasant. Associated with frowning face and frequent crying. Pain overwhelms the senses.  Ability to do any activity or maintain social relationships becomes significantly limited. Conversation becomes difficult. Pacing back and forth is common, as getting into a comfortable position is nearly impossible. Pain wakes you up from deep sleep. Physical signs will be obvious: pupillary dilation; increased sweating; goosebumps; brisk reflexes; cold, clammy hands and feet; nausea, vomiting or dry heaves; loss of appetite; significant sleep disturbance with inability to fall asleep or to remain asleep. When persistent, significant weight loss is observed due to the complete loss of appetite and sleep deprivation.  Blood   pressure and heart rate becomes significantly elevated. Caution: If elevated blood pressure triggers a pounding headache  associated with blurred vision, then the patient should immediately seek attention at an urgent or emergency care unit, as these may be signs of an impending stroke.    Emergency Department Pain Levels (6-10/10)  Emergency Room Pain 6 Severely limiting. Requires emergency care and should not be seen or managed at an outpatient pain management facility. Communication becomes difficult and requires great effort. Assistance to reach the emergency department may be required. Facial flushing and profuse sweating along with potentially dangerous increases in heart rate and blood pressure will be evident.   Distressing pain 7 Self-care is very difficult. Assistance is required to transport, or use restroom. Assistance to reach the emergency department will be required. Tasks requiring coordination, such as bathing and getting dressed become very difficult.   Disabling pain 8 Self-care is no longer possible. At this level, pain is disabling. The individual is unable to do even the most "basic" activities such as walking, eating, bathing, dressing, transferring to a bed, or toileting. Fine motor skills are lost. It is difficult to think clearly.   Incapacitating pain 9 Pain becomes incapacitating. Thought processing is no longer possible. Difficult to remember your own name. Control of movement and coordination are lost.   The worst pain imaginable 10 At this level, most patients pass out from pain. When this level is reached, collapse of the autonomic nervous system occurs, leading to a sudden drop in blood pressure and heart rate. This in turn results in a temporary and dramatic drop in blood flow to the brain, leading to a loss of consciousness. Fainting is one of the body's self defense mechanisms. Passing out puts the brain in a calmed state and causes it to shut down for a while, in order to begin the healing process.    Summary: 1. Refer to this scale when providing [place] with your pain level. 2. Be  accurate and careful when reporting your pain level. This will help with your care. 3. Over-reporting your pain level will lead to loss of credibility. 4. Even a level of 1/10 means that there is pain and will be treated at our facility. 5. High, inaccurate reporting will be documented as "Symptom Exaggeration", leading to loss of credibility and suspicions of possible secondary gains such as obtaining more narcotics, or wanting to appear disabled, for fraudulent reasons. 6. Only pain levels of 5 or below will be seen at our facility. 7. Pain levels of 6 and above will be sent to the Emergency Department and the appointment cancelled. ____________________________________________________________________________________________   ____________________________________________________________________________________________  Initial Gabapentin Titration  Medication used: Gabapentin (Generic Name) or Neurontin (Brand Name) 100 mg tablets/capsules  Reasons to stop increasing the dose:  Reason 1: You get good relief of symptoms, in which case there is no need to increase the daily dose any further.    Reason 2: You develop some side effects, such as sleeping all of the time, difficulty concentrating, or becoming disoriented, in which case you need to go down on the dose, to the prior level, where you were not experiencing any side effects. Stay on that dose longer, to allow more time for your body to get use it, before attempting to increase it again.   Steps: Step 1: Start by taking 1 (one) tablet at bedtime x 7 (seven) days.  Step 2: After being on 1 (one) tablet for 7 (seven) days, then increase it to  2 (two) tablets at bedtime for another 7 (seven) days.  Step 3: Next, after being on 2 (two) tablets at bedtime for 7 (seven) days, then increase it to 3 (three) tablets at bedtime, and stay on that dose until you see your doctor.  Reasons to stop increasing the dose: Reason 1: You get good  relief of symptoms, in which case there is no need to increase the daily dose any further.  Reason 2: You develop some side effects, such as sleeping all of the time, difficulty concentrating, or becoming disoriented, in which case you need to go down on the dose, to the prior level, where you were not experiencing any side effects. Stay on that dose longer, to allow more time for your body to get use it, before attempting to increase it again.  Endpoint: Once you have reached the maximum dose you can tolerate without side-effects, contact your physician so as to evaluate the results of the regimen.   Questions: Feel free to contact us for any questions or problems at [phone] ____________________________________________________________________________________________  ____________________________________________________________________________________________  Preparing for Procedure with Sedation  Instructions: . Oral Intake: Do not eat or drink anything for at least 8 hours prior to your procedure. . Transportation: Public transportation is not allowed. Bring an adult driver. The driver must be physically present in our waiting room before any procedure can be started. Marland Kitchen Physical Assistance: Bring an adult physically capable of assisting you, in the event you need help. This adult should keep you company at home for at least 6 hours after the procedure. . Blood Pressure Medicine: Take your blood pressure medicine with a sip of water the morning of the procedure. . Blood thinners:  . Diabetics on insulin: Notify the staff so that you can be scheduled 1st case in the morning. If your diabetes requires high dose insulin, take only  of your normal insulin dose the morning of the procedure and notify the staff that you have done so. . Preventing infections: Shower with an antibacterial soap the morning of your procedure. . Build-up your immune system: Take 1000 mg of Vitamin C with every  meal (3 times a day) the day prior to your procedure. Marland Kitchen Antibiotics: Inform the staff if you have a condition or reason that requires you to take antibiotics before dental procedures. . Pregnancy: If you are pregnant, call and cancel the procedure. . Sickness: If you have a cold, fever, or any active infections, call and cancel the procedure. . Arrival: You must be in the facility at least 30 minutes prior to your scheduled procedure. . Children: Do not bring children with you. . Dress appropriately: Bring dark clothing that you would not mind if they get stained. . Valuables: Do not bring any jewelry or valuables.  Procedure appointments are reserved for interventional treatments only. Marland Kitchen No Prescription Refills. . No medication changes will be discussed during procedure appointments. . No disability issues will be discussed.  Remember:  Regular Business hours are:  Monday to Thursday 8:00 AM to 4:00 PM  Provider's Schedule: Milinda Pointer, MD:  Procedure days: Tuesday and Thursday 7:30 AM to 4:00 PM  Gillis Santa, MD:  Procedure days: Monday and Wednesday 7:30 AM to 4:00 PM ____________________________________________________________________________________________

## 2017-10-08 ENCOUNTER — Emergency Department: Payer: Medicare Other

## 2017-10-08 ENCOUNTER — Other Ambulatory Visit: Payer: Self-pay

## 2017-10-08 ENCOUNTER — Emergency Department
Admission: EM | Admit: 2017-10-08 | Discharge: 2017-10-09 | Disposition: A | Discharge status: Home / Self Care | Payer: Medicare Other | Attending: Emergency Medicine | Admitting: Emergency Medicine

## 2017-10-08 DIAGNOSIS — Z7982 Long term (current) use of aspirin: Secondary | ICD-10-CM | POA: Insufficient documentation

## 2017-10-08 DIAGNOSIS — Z96651 Presence of right artificial knee joint: Secondary | ICD-10-CM | POA: Insufficient documentation

## 2017-10-08 DIAGNOSIS — Y998 Other external cause status: Secondary | ICD-10-CM | POA: Diagnosis not present

## 2017-10-08 DIAGNOSIS — Z79899 Other long term (current) drug therapy: Secondary | ICD-10-CM | POA: Insufficient documentation

## 2017-10-08 DIAGNOSIS — Z91040 Latex allergy status: Secondary | ICD-10-CM | POA: Insufficient documentation

## 2017-10-08 DIAGNOSIS — Z96641 Presence of right artificial hip joint: Secondary | ICD-10-CM | POA: Insufficient documentation

## 2017-10-08 DIAGNOSIS — R410 Disorientation, unspecified: Secondary | ICD-10-CM

## 2017-10-08 DIAGNOSIS — G4751 Confusional arousals: Secondary | ICD-10-CM | POA: Insufficient documentation

## 2017-10-08 DIAGNOSIS — Y92009 Unspecified place in unspecified non-institutional (private) residence as the place of occurrence of the external cause: Secondary | ICD-10-CM | POA: Insufficient documentation

## 2017-10-08 DIAGNOSIS — Z87891 Personal history of nicotine dependence: Secondary | ICD-10-CM | POA: Insufficient documentation

## 2017-10-08 DIAGNOSIS — S0990XA Unspecified injury of head, initial encounter: Secondary | ICD-10-CM | POA: Insufficient documentation

## 2017-10-08 DIAGNOSIS — W0110XA Fall on same level from slipping, tripping and stumbling with subsequent striking against unspecified object, initial encounter: Secondary | ICD-10-CM | POA: Diagnosis not present

## 2017-10-08 DIAGNOSIS — I10 Essential (primary) hypertension: Secondary | ICD-10-CM | POA: Diagnosis not present

## 2017-10-08 DIAGNOSIS — Y9389 Activity, other specified: Secondary | ICD-10-CM | POA: Insufficient documentation

## 2017-10-08 LAB — TROPONIN I

## 2017-10-08 LAB — COMPREHENSIVE METABOLIC PANEL
ALK PHOS: 72 U/L (ref 38–126)
ALT: 19 U/L (ref 17–63)
AST: 26 U/L (ref 15–41)
Albumin: 3.8 g/dL (ref 3.5–5.0)
Anion gap: 7 (ref 5–15)
BILIRUBIN TOTAL: 0.5 mg/dL (ref 0.3–1.2)
BUN: 15 mg/dL (ref 6–20)
CHLORIDE: 100 mmol/L — AB (ref 101–111)
CO2: 29 mmol/L (ref 22–32)
CREATININE: 0.88 mg/dL (ref 0.61–1.24)
Calcium: 9.1 mg/dL (ref 8.9–10.3)
GFR calc Af Amer: >60 mL/min (ref >60)
Glucose, Bld: 89 mg/dL (ref 65–99)
Potassium: 4.1 mmol/L (ref 3.5–5.1)
Sodium: 136 mmol/L (ref 135–145)
TOTAL PROTEIN: 6.7 g/dL (ref 6.5–8.1)

## 2017-10-08 LAB — URINALYSIS, ROUTINE W REFLEX MICROSCOPIC
Bilirubin Urine: NEGATIVE
GLUCOSE, UA: NEGATIVE mg/dL
HGB URINE DIPSTICK: NEGATIVE
Ketones, ur: NEGATIVE mg/dL
Leukocytes, UA: NEGATIVE
Nitrite: NEGATIVE
PROTEIN: NEGATIVE mg/dL
Specific Gravity, Urine: 1.01 (ref 1.005–1.030)
pH: 6 (ref 5.0–8.0)

## 2017-10-08 LAB — CBC
HCT: 36.3 % — ABNORMAL LOW (ref 40.0–52.0)
Hemoglobin: 12.3 g/dL — ABNORMAL LOW (ref 13.0–18.0)
MCH: 29.2 pg (ref 26.0–34.0)
MCHC: 33.9 g/dL (ref 32.0–36.0)
MCV: 86.2 fL (ref 80.0–100.0)
PLATELETS: 184 10*3/uL (ref 150–440)
RBC: 4.21 MIL/uL — AB (ref 4.40–5.90)
RDW: 17.4 % — ABNORMAL HIGH (ref 11.5–14.5)
WBC: 4.3 10*3/uL (ref 3.8–10.6)

## 2017-10-08 MED ORDER — SODIUM CHLORIDE 0.9 % IV BOLUS
1000.0000 mL | Freq: Once | INTRAVENOUS | Status: AC
  Administered 2017-10-08: 1000 mL via INTRAVENOUS

## 2017-10-08 NOTE — ED Provider Notes (Signed)
[facility] Emergency Department Provider Note  Time seen: 9:56 PM  I have reviewed the triage vital signs and the nursing notes.   HISTORY  Chief Complaint Fall    HPI Jeremiah [name]. is a 77 y.o. male with a past medical history of hypertension, seizure disorder, presents the emergency department for a fall at home.  According to the patient he is not entirely sure what happened he states he felt dizzy and lightheaded and then fell.  Patient hit left side of his head but does not believe he lost consciousness.  Denies any nausea, vomiting, diarrhea.  No chest pain neck pain or back pain.  No abdominal pain.  Per EMS report family states that the patient has been somewhat confused today.  Here the patient is awake alert oriented, has no complaints at this time.  Past Medical History:  Diagnosis Date  . Allergy   . Arthritis   . Hypertension   . Rheumatoid arthritis ([place])   . Seizures ([place])   . Sepsis ([place]) 05/23/2015    Patient Active Problem List   Diagnosis Date Noted  . Neurogenic pain 10/02/2017  . Spondylosis without myelopathy or radiculopathy, lumbosacral region 07/17/2017  . Trigger point with back pain (recurrent/intermittent) 07/17/2017  . Chronic musculoskeletal pain 02/27/2017  . Myofascial pain syndrome 02/27/2017  . Long term current use of anticoagulant (Plaquenil) 02/27/2017  . Chronic lumbar radiculitis (L5 dermatome) (Right) 02/27/2017  . Chronic lumbar radiculitis (L3 dermatome) (Left) 02/27/2017  . Chronic foot pain (Right) 02/27/2017  . Chronic thoracic back pain (Bilateral) (R>L) 02/27/2017  . Chronic neck pain (posterior) (Bilateral) (R>L) 02/27/2017  . Chronic shoulder pain (Bilateral) (L>R) 02/27/2017  . 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 DDD (degenerative disc disease), lumbar 02/19/2017  . 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 Chronic knee pain (B) (R>L) 02/19/2017  . 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 Grade 1 Retrolisthesis of L1 over L2 02/19/2017  . T11 wedge compression fracture ([employer]prox. 1993) 02/19/2017  . Lumbar facet arthropathy 02/19/2017  . Lumbar facet syndrome (Bilateral) 02/19/2017  . Lumbar foraminal stenosis (Bilateral: L1-2)  (Right: L3-4, L4-5) 02/19/2017  . 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 Disorder of skeletal system 02/19/2017  . Pharmacologic therapy 02/19/2017  . Problems influencing health status 02/19/2017  . Chronic pain syndrome 01/14/2017  . Gastroesophageal reflux disease 01/14/2017  . Hyperlipidemia 01/14/2017  . Lumbar radiculopathy 01/14/2017  . Postoperative anemia due to acute blood loss 01/14/2017  . Postprocedural hypotension 01/14/2017  . Chronic lower extremity pain (Secondary Area of Pain) (Bilateral) (R>L) 01/14/2017  . Rheumatoid arthritis ([place]) 12/17/2013  . Type II or unspecified type diabetes mellitus without mention of complication, not stated as uncontrolled 12/17/2013  . Benign essential hypertension 11/13/2013  . Chronic low back pain (Primary Area of Pain) (Bilateral) (R>L) 11/13/2013  . Prostatitis 11/13/2013  . Seizure ([place]) 11/13/2013  . Left bundle branch block 05/21/2013  . Dorsalgia 05/21/2013    Past Surgical History:  Procedure Laterality Date  . back surgery five times  04/2016  . CARPAL TUNNEL RELEASE    . HERNIA REPAIR    . JOINT REPLACEMENT    . knee replacement right    . right hip replacement    . rotator cuff surgery    . SPINE SURGERY      Prior to Admission medications   Medication Sig Start Date End Date Taking?  Authorizing Provider  aspirin 81 MG chewable tablet Chew 81 mg by mouth daily.    [provider]  Cinnamon 500 MG capsule Take 500 mg by mouth 2 (two) times daily.    [provider]  etanercept (ENBREL SURECLICK) 50 MG/ML injection Inject 50 mg into the skin once a week.     [provider]  folic acid  (FOLVITE) 884 MCG tablet Take 800 mcg by mouth daily.    [provider]  gabapentin (NEURONTIN) 100 MG capsule Take 1-3 capsules (100-300 mg total) by mouth at bedtime. Follow written titration schedule. 10/02/17 11/01/17  Milinda Pointer, MD  hydroxychloroquine (PLAQUENIL) 200 MG tablet Take 400 mg by mouth at bedtime.    [provider]  lamoTRIgine (LAMICTAL) 200 MG tablet Take 200 mg by mouth 2 (two) times daily.    [provider]  losartan-hydrochlorothiazide (HYZAAR) 50-12.5 MG tablet Take 1 tablet by mouth daily.    [provider]  magnesium oxide (MAG-OX) 400 MG tablet Take 400 mg by mouth daily.    [provider]  meloxicam (MOBIC) 15 MG tablet Take 15 mg by mouth daily.  01/09/17 01/09/18  [provider]  methotrexate (RHEUMATREX) 2.5 MG tablet Take 25 mg by mouth once a week. Pt takes on Monday.    [provider]  omeprazole (PRILOSEC) 20 MG capsule Take 40 mg by mouth as needed.     [provider]  traZODone (DESYREL) 50 MG tablet Take 50 mg by mouth at bedtime.    [provider]    Allergies  Allergen Reactions  . Cefoperazone Anaphylaxis  . Penicillins Anaphylaxis and Other (See Comments)    Has patient had a PCN reaction causing immediate rash, facial/tongue/throat swelling, SOB or lightheadedness with hypotension: Yes Has patient had a PCN reaction causing severe rash involving mucus membranes or skin necrosis: No Has patient had a PCN reaction that required hospitalization No Has patient had a PCN reaction occurring within the last 10 years: No If all of the above answers are "NO", then may proceed with Cephalosporin use.  . Latex Rash    Family History  Problem Relation Age of Onset  . Hypertension Other   . Hypertension Mother   . Dementia Mother   . Dementia Father     Social History Social History   Tobacco Use  . Smoking status: Former Smoker    Packs/day: 1.00    Years:  20.00    Pack years: 20.00    Types: Cigarettes  . Smokeless tobacco: Never Used  Substance Use Topics  . Alcohol use: No  . Drug use: Not on file    Review of Systems Constitutional: Negative for loss of consciousness. Eyes: Negative for visual complaints ENT: Negative for recent illness/congestion Cardiovascular: Negative for chest pain. Respiratory: Negative for shortness of breath. Gastrointestinal: Negative for abdominal pain, vomiting Genitourinary: Negative for urinary compaints Musculoskeletal: Negative for musculoskeletal complaints Skin: Negative for skin complaints  Neurological: Negative for headache All other ROS negative  ____________________________________________   PHYSICAL EXAM:  VITAL SIGNS: ED Triage Vitals  Enc Vitals Group     BP 10/08/17 2139 131/88     Pulse Rate 10/08/17 2139 71     Resp 10/08/17 2139 (!) 21     Temp 10/08/17 2139 97.8 F (36.6 C)     Temp Source 10/08/17 2139 Oral     SpO2 10/08/17 2139 96 %     Weight 10/08/17 2140 235 lb (106.6  kg)     Height 10/08/17 2140 6\' 1"  (1.854 m)     Head Circumference --      Peak Flow --      Pain Score 10/08/17 2139 0     Pain Loc --      Pain Edu? --      Excl. in [place]? --    Constitutional: Alert and oriented. Well appearing and in no distress. Eyes: Normal exam ENT   Head: Small hematoma to left forehead, mild abrasion.  No laceration.  Hemostatic.  Nontender C-spine.   Mouth/Throat: Mucous membranes are moist. Cardiovascular: Normal rate, regular rhythm. No murmur Respiratory: Normal respiratory effort without tachypnea nor retractions. Breath sounds are clear Gastrointestinal: Soft and nontender. No distention.   Musculoskeletal: Nontender with normal range of motion in all extremities. Neurologic:  Normal speech and language. No gross focal neurologic deficits  Skin:  Skin is warm, dry and intact.  Psychiatric: Mood and affect are normal.    ____________________________________________    EKG  EKG reviewed and interpreted by myself shows normal sinus rhythm at 68 bpm with a narrow QRS, normal axis, largely normal intervals no concerning ST changes.  ____________________________________________    RADIOLOGY  CT head and neck are negative for acute abnormality.  ____________________________________________   INITIAL IMPRESSION / ASSESSMENT AND PLAN / ED COURSE  Pertinent labs & imaging results that were available during my care of the patient were reviewed by me and considered in my medical decision making (see chart for details).  Patient presents to the emergency department after a fall at home.  Possible confusion earlier today.  We will obtain a CT head and CT C-spine as a precaution.  Will obtain labs as well as a urinalysis, IV hydrate and continue to closely monitor.  Patient agreeable to plan of care.  Awaiting family arrival for further history. Patient's blood work and CT imaging are largely within normal limits/baseline for the patient.  Awaiting urinalysis results.  Patient continues to appear overall very well in the emergency department.  ____________________________________________   FINAL CLINICAL IMPRESSION(S) / ED DIAGNOSES  Cheral Marker, MD 10/11/17 2040

## 2017-10-08 NOTE — ED Triage Notes (Signed)
Pt arrived via EMS from home d/t unwitnessed fall. EMS reports pt family stated pt was showing signs on confusion and slurred speech prior to arrival. Pt has hematoma to the left forehead and small skin tear to his left forearm. Pt reports he was dizzy prior to the fall but not at this time.EMS reports that pt has been  A&O x4 throughout transport. Pt has hx of seizures and is currently taking losartan.

## 2017-10-29 ENCOUNTER — Encounter: Payer: Self-pay | Admitting: Nurse Practitioner

## 2017-11-21 ENCOUNTER — Other Ambulatory Visit: Payer: Self-pay | Admitting: Pain Medicine

## 2017-11-21 DIAGNOSIS — M792 Neuralgia and neuritis, unspecified: Secondary | ICD-10-CM

## 2017-12-02 ENCOUNTER — Ambulatory Visit: Payer: Self-pay | Admitting: Pain Medicine

## 2017-12-10 NOTE — Progress Notes (Signed)
Patient's Name: Jeremiah [name].  MRN: [id number]  Referring Provider: Derinda Late, MD  DOB: Oct 24, 1940  PCP: Derinda Late, MD  DOS: 12/11/2017  Note by: Gaspar Cola, MD  Service setting: Ambulatory outpatient  Specialty: Interventional Pain Management  Location: [facility] (AMB) Pain Management Facility    Patient type: Established   Primary Reason(s) for Visit: Evaluation of chronic illnesses with exacerbation, or progression (Level of risk: moderate) CC: Back Pain (lower)  HPI  Jeremiah [name] is a 77 y.o. year old, male patient, who comes today for a follow-up evaluation. He has Chronic pain syndrome; Benign essential hypertension; Gastroesophageal reflux disease; Hyperlipidemia; Left bundle branch block; Chronic low back pain (Primary Area of Pain) (Bilateral) (R>L); Lumbar radiculopathy; Postoperative anemia due to acute blood loss; Postprocedural hypotension; Prostatitis; Rheumatoid arthritis ([place]); Seizure ([place]); Type II or unspecified type diabetes mellitus without mention of complication, not stated as uncontrolled; Chronic lower extremity pain (Secondary Area of Pain) (Bilateral) (R>L); Failed back surgical syndrome x 5; DDD (degenerative disc disease), lumbar; Spondylosis of lumbar spine; Chronic knee pain (Bilateral) (R>L); 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); Disorder of skeletal system; Pharmacologic therapy; Problems influencing health status; Chronic musculoskeletal pain; Myofascial pain syndrome; Long term current use of anticoagulant (Plaquenil); Chronic lumbar radiculitis (L5 dermatome) (Right); Chronic lumbar radiculitis (L3 dermatome) (Left); Chronic foot pain (Right); Chronic thoracic back pain  (Bilateral) (R>L); Chronic neck pain (posterior) (Bilateral) (R>L); Chronic shoulder pain (Bilateral) (L>R); Spondylosis without myelopathy or radiculopathy, lumbosacral region; Trigger point with back pain (recurrent/intermittent); Dorsalgia; and Neurogenic pain on their problem list. Jeremiah [name] was last seen on 11/21/2017. His primarily concern today is the Back Pain (lower)  Pain Assessment: Location: Lower Back Radiating: Pain radiates down both leg, pain in right leg goes down to foot Onset: More than a month ago Duration: Chronic pain Quality: Constant, Aching Severity: 5 /10 (subjective, self-reported pain score)  Note: Reported level is inconsistent with clinical observations. Clinically the patient looks like a 2/10 A 2/10 is viewed as "Mild to Moderate" and described as noticeable and distracting. Impossible to hide from other people. More frequent flare-ups. Still possible to adapt and function close to normal. It can be very annoying and may have occasional stronger flare-ups. With discipline, patients may get used to it and adapt. Jeremiah [name] continues to use a standard subjective pain scale, rather than an objective pain scale as instructed. When using our objective Pain Scale, levels between 6 and 10/10 are said to belong in an emergency room, as it progressively worsens from a 6/10, described as severely limiting, requiring emergency care not usually available at an outpatient pain management facility. At a 6/10 level, communication becomes difficult and requires great effort. Assistance to reach the emergency department may be required. Facial flushing and profuse sweating along with potentially dangerous increases in heart rate and blood pressure will be evident. Effect on ADL: limits daily activites Timing: Constant Modifying factors: medication, reclinier chair BP: (!) 143/82  HR: 83  He wants to avoid interventional therapies, due to bad experiences with other practitioners doing  facet injections. Gabapentin 300 mg PO HS. We'll continue and see him back for med management in 6 months.   Further details on both, my assessment(s), as well as the proposed treatment plan,  please see below.  Laboratory Chemistry  Inflammation Markers (CRP: Acute Phase) (ESR: Chronic Phase) Lab Results  Component Value Date   CRP 37.5 (H) 01/14/2017   ESRSEDRATE 7 01/14/2017   LATICACIDVEN 1.2 05/24/2015                         Rheumatology Markers No results found for: RF, ANA, LABURIC, URICUR, LYMEIGGIGMAB, LYMEABIGMQN, HLAB27                      Renal Function Markers Lab Results  Component Value Date   BUN 15 10/08/2017   CREATININE 0.88 10/08/2017   BCR 14 01/14/2017   GFRAA >60 10/08/2017   GFRNONAA >60 10/08/2017                             Hepatic Function Markers Lab Results  Component Value Date   AST 26 10/08/2017   ALT 19 10/08/2017   ALBUMIN 3.8 10/08/2017   ALKPHOS 72 10/08/2017                        Electrolytes Lab Results  Component Value Date   NA 136 10/08/2017   K 4.1 10/08/2017   CL 100 (L) 10/08/2017   CALCIUM 9.1 10/08/2017   MG 1.8 01/14/2017                        Neuropathy Markers Lab Results  Component Value Date   VITAMINB12 995 01/14/2017                        Bone Pathology Markers Lab Results  Component Value Date   25OHVITD1 28 (L) 01/14/2017   25OHVITD2 <1.0 01/14/2017   25OHVITD3 28 01/14/2017                         Coagulation Parameters Lab Results  Component Value Date   PLT 184 10/08/2017                        Cardiovascular Markers Lab Results  Component Value Date   TROPONINI <0.03 10/08/2017   HGB 12.3 (L) 10/08/2017   HCT 36.3 (L) 10/08/2017                         CA Markers No results found for: CEA, CA125, LABCA2                      Note: Lab results reviewed.  Recent Diagnostic Imaging Review  Cervical Imaging: Cervical CT wo contrast:  Results for orders placed during the hospital  encounter of 10/08/17  CT Cervical Spine Wo Contrast   Narrative CLINICAL DATA:  Fall with head trauma.  EXAM: CT HEAD WITHOUT CONTRAST  CT CERVICAL SPINE WITHOUT CONTRAST  TECHNIQUE: Multidetector CT imaging of the head and cervical spine was performed following the standard protocol without intravenous contrast. Multiplanar CT image reconstructions of the cervical spine were also generated.  COMPARISON:  Head CT 05/23/2015  FINDINGS: CT HEAD FINDINGS  Brain: No mass lesion, intraparenchymal hemorrhage or extra-axial collection. No evidence of acute cortical infarct. Brain parenchyma and CSF-containing spaces are normal for age.  Vascular: No hyperdense vessel or unexpected calcification.  Skull: Normal visualized  skull base, calvarium and extracranial soft tissues.  Sinuses/Orbits: No sinus fluid levels or advanced mucosal thickening. No mastoid effusion. Normal orbits.  CT CERVICAL SPINE FINDINGS  Alignment: Grade 1 C4-5 retrolisthesis and grade 1 C5-6 anterolisthesis due to facet hypertrophy. Alignment is otherwise normal.  Skull base and vertebrae: No acute fracture.  Soft tissues and spinal canal: No prevertebral fluid or swelling. No visible canal hematoma.  Disc levels: Moderate neural foraminal stenosis bilaterally at C3-4 and C4-5 due to combination of uncovertebral and facet hypertrophy. Moderate-to-severe left C6-7 foraminal stenosis.  Upper chest: No pneumothorax, pulmonary nodule or pleural effusion.  Other: Normal visualized paraspinal cervical soft tissues.  IMPRESSION: 1. Normal brain. 2. No acute fracture or static subluxation of the cervical spine. 3. Multilevel foraminal narrowing due to combination of uncovertebral and facet hypertrophy.   Electronically Signed   By: Ulyses Jarred M.D.   On: 10/08/2017 22:28    Complexity Note: Imaging results reviewed. Results shared with Mr. Venhuizen, using Layman's terms.                          Meds   Current Outpatient Medications:  .  aspirin 81 MG chewable tablet, Chew 81 mg by mouth daily., Disp: , Rfl:  .  Cinnamon 500 MG capsule, Take 500 mg by mouth 2 (two) times daily., Disp: , Rfl:  .  etanercept (ENBREL SURECLICK) 50 MG/ML injection, Inject 50 mg into the skin once a week. , Disp: , Rfl:  .  folic acid (FOLVITE) 811 MCG tablet, Take 800 mcg by mouth daily., Disp: , Rfl:  .  hydroxychloroquine (PLAQUENIL) 200 MG tablet, Take 400 mg by mouth at bedtime., Disp: , Rfl:  .  lamoTRIgine (LAMICTAL) 200 MG tablet, Take 200 mg by mouth 2 (two) times daily., Disp: , Rfl:  .  losartan-hydrochlorothiazide (HYZAAR) 50-12.5 MG tablet, Take 1 tablet by mouth daily., Disp: , Rfl:  .  magnesium oxide (MAG-OX) 400 MG tablet, Take 400 mg by mouth daily., Disp: , Rfl:  .  meloxicam (MOBIC) 15 MG tablet, Take 15 mg by mouth daily. , Disp: , Rfl:  .  methotrexate (RHEUMATREX) 2.5 MG tablet, Take 25 mg by mouth once a week. Pt takes on Monday., Disp: , Rfl:  .  omeprazole (PRILOSEC) 20 MG capsule, Take 40 mg by mouth as needed. , Disp: , Rfl:  .  traZODone (DESYREL) 50 MG tablet, Take 50 mg by mouth at bedtime., Disp: , Rfl:  .  gabapentin (NEURONTIN) 300 MG capsule, Take 1 capsule (300 mg total) by mouth at bedtime., Disp: 90 capsule, Rfl: 1  ROS  Constitutional: Denies any fever or chills Gastrointestinal: No reported hemesis, hematochezia, vomiting, or acute GI distress Musculoskeletal: Denies any acute onset joint swelling, redness, loss of ROM, or weakness Neurological: No reported episodes of acute onset apraxia, aphasia, dysarthria, agnosia, amnesia, paralysis, loss of coordination, or loss of consciousness  Allergies  Jeremiah [name] is allergic to cefoperazone; penicillins; and latex.  PFSH  Drug: Jeremiah [name]  has no drug history on file. Alcohol:  reports that he does not drink alcohol. Tobacco:  reports that he has quit smoking. His smoking use included cigarettes. He has a 20.00  pack-year smoking history. He has never used smokeless tobacco. Medical:  has a past medical history of Allergy, Arthritis, Hypertension, Rheumatoid arthritis ([place]), Seizures ([place]), and Sepsis ([place]) (05/23/2015). Surgical: Jeremiah [name]  has a past surgical history that includes back surgery five  times (04/2016); right hip replacement; knee replacement right; Hernia repair; Spine surgery; Joint replacement; rotator cuff surgery; and Carpal tunnel release. Family: family history includes Dementia in his father and mother; Hypertension in his mother and other.  Constitutional Exam  General appearance: Well nourished, well developed, and well hydrated. In no apparent acute distress Vitals:   12/11/17 1026  BP: (!) 143/82  Pulse: 83  Temp: 98.3 F (36.8 C)  SpO2: 98%  Weight: 230 lb (104.3 kg)  Height: 6' (1.829 m)   BMI Assessment: Estimated body mass index is 31.19 kg/m as calculated from the following:   Height as of this encounter: 6' (1.829 m).   Weight as of this encounter: 230 lb (104.3 kg).  BMI interpretation table: BMI level Category Range association with higher incidence of chronic pain  <18 kg/m2 Underweight   18.5-24.9 kg/m2 Ideal body weight   25-29.9 kg/m2 Overweight Increased incidence by 20%  30-34.9 kg/m2 Obese (Class I) Increased incidence by 68%  35-39.9 kg/m2 Severe obesity (Class II) Increased incidence by 136%  >40 kg/m2 Extreme obesity (Class III) Increased incidence by 254%   Patient's current BMI Ideal Body weight  Body mass index is 31.19 kg/m. Ideal body weight: 77.6 kg (171 lb 1.2 oz) Adjusted ideal body weight: 88.3 kg (194 lb 10.3 oz)   BMI Readings from Last 4 Encounters:  12/11/17 31.19 kg/m  10/08/17 31.00 kg/m  10/02/17 31.19 kg/m  07/17/17 31.19 kg/m   Wt Readings from Last 4 Encounters:  12/11/17 230 lb (104.3 kg)  10/08/17 235 lb (106.6 kg)  10/02/17 230 lb (104.3 kg)  07/17/17 230 lb (104.3 kg)  Psych/Mental status: Alert, oriented x 3  (person, place, & time)       Eyes: PERLA Respiratory: No evidence of acute respiratory distress  Cervical Spine Area Exam  Skin & Axial Inspection: No masses, redness, edema, swelling, or associated skin lesions Alignment: Symmetrical Functional ROM: Unrestricted ROM      Stability: No instability detected Muscle Tone/Strength: Functionally intact. No obvious neuro-muscular anomalies detected. Sensory (Neurological): Unimpaired Palpation: No palpable anomalies              Upper Extremity (UE) Exam    Side: Right upper extremity  Side: Left upper extremity  Skin & Extremity Inspection: Skin color, temperature, and hair growth are WNL. No peripheral edema or cyanosis. No masses, redness, swelling, asymmetry, or associated skin lesions. No contractures.  Skin & Extremity Inspection: Skin color, temperature, and hair growth are WNL. No peripheral edema or cyanosis. No masses, redness, swelling, asymmetry, or associated skin lesions. No contractures.  Functional ROM: Unrestricted ROM          Functional ROM: Unrestricted ROM          Muscle Tone/Strength: Functionally intact. No obvious neuro-muscular anomalies detected.  Muscle Tone/Strength: Functionally intact. No obvious neuro-muscular anomalies detected.  Sensory (Neurological): Unimpaired          Sensory (Neurological): Unimpaired          Palpation: No palpable anomalies              Palpation: No palpable anomalies              Provocative Test(s):  Phalen's test: deferred Tinel's test: deferred Apley's scratch test (touch opposite shoulder):  Action 1 (Across chest): deferred Action 2 (Overhead): deferred Action 3 (LB reach): deferred   Provocative Test(s):  Phalen's test: deferred Tinel's test: deferred Apley's scratch test (touch opposite shoulder):  Action 1 (  Across chest): deferred Action 2 (Overhead): deferred Action 3 (LB reach): deferred    Thoracic Spine Area Exam  Skin & Axial Inspection: No masses, redness, or  swelling Alignment: Symmetrical Functional ROM: Unrestricted ROM Stability: No instability detected Muscle Tone/Strength: Functionally intact. No obvious neuro-muscular anomalies detected. Sensory (Neurological): Unimpaired Muscle strength & Tone: No palpable anomalies  Lumbar Spine Area Exam  Skin & Axial Inspection: No masses, redness, or swelling Alignment: Symmetrical Functional ROM: Decreased ROM       Stability: No instability detected Muscle Tone/Strength: Functionally intact. No obvious neuro-muscular anomalies detected. Sensory (Neurological): Movement-associated discomfort Palpation: Complains of area being tender to palpation       Provocative Tests: Lumbar Hyperextension/rotation test: deferred today       Lumbar quadrant test (Kemp's test): deferred today       Lumbar Lateral bending test: deferred today       Patrick's Maneuver: deferred today                   FABER test: deferred today                   Thigh-thrust test: deferred today       S-I compression test: deferred today       S-I distraction test: deferred today        Gait & Posture Assessment  Ambulation: Patient ambulates using a cane Gait: Limited. Using assistive device to ambulate Posture: WNL   Lower Extremity Exam    Side: Right lower extremity  Side: Left lower extremity  Stability: No instability observed          Stability: No instability observed          Skin & Extremity Inspection: Skin color, temperature, and hair growth are WNL. No peripheral edema or cyanosis. No masses, redness, swelling, asymmetry, or associated skin lesions. No contractures.  Skin & Extremity Inspection: Skin color, temperature, and hair growth are WNL. No peripheral edema or cyanosis. No masses, redness, swelling, asymmetry, or associated skin lesions. No contractures.  Functional ROM: Unrestricted ROM                  Functional ROM: Unrestricted ROM                  Muscle Tone/Strength: Functionally intact. No  obvious neuro-muscular anomalies detected.  Muscle Tone/Strength: Functionally intact. No obvious neuro-muscular anomalies detected.  Sensory (Neurological): Unimpaired  Sensory (Neurological): Unimpaired  Palpation: No palpable anomalies  Palpation: No palpable anomalies   Assessment  Primary Diagnosis & Pertinent Problem List: The primary encounter diagnosis was Chronic pain syndrome. Diagnoses of Chronic low back pain (Primary Area of Pain) (Bilateral) (R>L), Chronic lower extremity pain (Secondary Area of Pain) (Bilateral) (R>L), and Neurogenic pain were also pertinent to this visit.  Status Diagnosis  Controlled Controlled Controlled 1. Chronic pain syndrome   2. Chronic low back pain (Primary Area of Pain) (Bilateral) (R>L)   3. Chronic lower extremity pain (Secondary Area of Pain) (Bilateral) (R>L)   4. Neurogenic pain     Problems updated and reviewed during this visit: No problems updated. Plan of Care  Pharmacotherapy (Medications Ordered): Meds ordered this encounter  Medications  . gabapentin (NEURONTIN) 300 MG capsule    Sig: Take 1 capsule (300 mg total) by mouth at bedtime.    Dispense:  90 capsule    Refill:  1    Do not place this medication, or any other prescription from our  practice, on "Automatic Refill". Patient may have prescription filled one day early if pharmacy is closed on scheduled refill date.   Medications administered today: Joneen Boers B. Rondel Jumbo. had no medications administered during this visit.  Procedure Orders    No procedure(s) ordered today   Lab Orders  No laboratory test(s) ordered today   Imaging Orders  No imaging studies ordered today   Referral Orders  No referral(s) requested today    Interventional management options: Planned, scheduled, and/or pending:   Gabapentin HS trial. He will return in 30 days for evaluation on his Gabapentin titration. If tolerated, then we will move on to adding Gabapentin 100 mg during the day. We'll  provide him with our titration instructions to follow. He wants to avoid interventional therapies, due to bad experiences with other practitioners doing facet injections.    Considering:   Diagnostic/palliativetrigger point injectionsof the lumbosacral and thoracolumbar spine Diagnostic bilateral lumbar facet block Possible bilateral lumbar facet RFA Diagnostic caudal epidural steroid injection + diagnostic epidurogram Possible Racz procedure Diagnostic thoracic epidural steroid injection Diagnostic right-sided L3-4 and L4-5 transforaminal epidural steroid injection Diagnostic bilateral L1-2 transforaminal epidural steroid injection PossibleSCStrial   Palliative PRN treatment(s):   Diagnostic/palliativetrigger point injectionsof the lumbosacral and thoracolumbar spine Diagnostic bilateral lumbar facet block #1 under fluoroscopy and IV sedation.    Provider-requested follow-up: Return in about 6 months (around 06/13/2018) for Med-Mgmt, w/ Dionisio David, NP.  Future Appointments  Date Time Provider [place]  06/18/2018 10:30 AM Vevelyn Francois, NP [facility] None   Primary Care Physician: Derinda Late, MD Location: [facility] Out[facility] Pain Management Facility Note by: Gaspar Cola, MD Date: 12/11/2017; Time: 11:14 AM

## 2017-12-11 ENCOUNTER — Ambulatory Visit: Payer: Medicare Other | Attending: Pain Medicine | Admitting: Pain Medicine

## 2017-12-11 ENCOUNTER — Other Ambulatory Visit: Payer: Self-pay

## 2017-12-11 ENCOUNTER — Encounter: Payer: Self-pay | Admitting: Pain Medicine

## 2017-12-11 VITALS — BP 143/82 | HR 83 | Temp 98.3°F | Ht 72.0 in | Wt 230.0 lb

## 2017-12-11 DIAGNOSIS — M5136 Other intervertebral disc degeneration, lumbar region: Secondary | ICD-10-CM | POA: Diagnosis not present

## 2017-12-11 DIAGNOSIS — I10 Essential (primary) hypertension: Secondary | ICD-10-CM | POA: Insufficient documentation

## 2017-12-11 DIAGNOSIS — M79605 Pain in left leg: Secondary | ICD-10-CM | POA: Diagnosis not present

## 2017-12-11 DIAGNOSIS — Z880 Allergy status to penicillin: Secondary | ICD-10-CM | POA: Insufficient documentation

## 2017-12-11 DIAGNOSIS — M47896 Other spondylosis, lumbar region: Secondary | ICD-10-CM | POA: Insufficient documentation

## 2017-12-11 DIAGNOSIS — M79671 Pain in right foot: Secondary | ICD-10-CM | POA: Insufficient documentation

## 2017-12-11 DIAGNOSIS — M47817 Spondylosis without myelopathy or radiculopathy, lumbosacral region: Secondary | ICD-10-CM | POA: Diagnosis not present

## 2017-12-11 DIAGNOSIS — G8929 Other chronic pain: Secondary | ICD-10-CM

## 2017-12-11 DIAGNOSIS — M5441 Lumbago with sciatica, right side: Secondary | ICD-10-CM

## 2017-12-11 DIAGNOSIS — Z8249 Family history of ischemic heart disease and other diseases of the circulatory system: Secondary | ICD-10-CM | POA: Insufficient documentation

## 2017-12-11 DIAGNOSIS — M79604 Pain in right leg: Secondary | ICD-10-CM | POA: Diagnosis not present

## 2017-12-11 DIAGNOSIS — I447 Left bundle-branch block, unspecified: Secondary | ICD-10-CM | POA: Insufficient documentation

## 2017-12-11 DIAGNOSIS — M542 Cervicalgia: Secondary | ICD-10-CM | POA: Insufficient documentation

## 2017-12-11 DIAGNOSIS — E119 Type 2 diabetes mellitus without complications: Secondary | ICD-10-CM | POA: Insufficient documentation

## 2017-12-11 DIAGNOSIS — Z96641 Presence of right artificial hip joint: Secondary | ICD-10-CM | POA: Insufficient documentation

## 2017-12-11 DIAGNOSIS — M545 Low back pain: Secondary | ICD-10-CM | POA: Insufficient documentation

## 2017-12-11 DIAGNOSIS — M5116 Intervertebral disc disorders with radiculopathy, lumbar region: Secondary | ICD-10-CM | POA: Diagnosis not present

## 2017-12-11 DIAGNOSIS — K219 Gastro-esophageal reflux disease without esophagitis: Secondary | ICD-10-CM | POA: Insufficient documentation

## 2017-12-11 DIAGNOSIS — Z96651 Presence of right artificial knee joint: Secondary | ICD-10-CM | POA: Insufficient documentation

## 2017-12-11 DIAGNOSIS — G894 Chronic pain syndrome: Secondary | ICD-10-CM

## 2017-12-11 DIAGNOSIS — Z5181 Encounter for therapeutic drug level monitoring: Secondary | ICD-10-CM | POA: Diagnosis not present

## 2017-12-11 DIAGNOSIS — Z791 Long term (current) use of non-steroidal anti-inflammatories (NSAID): Secondary | ICD-10-CM | POA: Insufficient documentation

## 2017-12-11 DIAGNOSIS — M25512 Pain in left shoulder: Secondary | ICD-10-CM | POA: Insufficient documentation

## 2017-12-11 DIAGNOSIS — M4804 Spinal stenosis, thoracic region: Secondary | ICD-10-CM | POA: Diagnosis not present

## 2017-12-11 DIAGNOSIS — M25561 Pain in right knee: Secondary | ICD-10-CM | POA: Diagnosis not present

## 2017-12-11 DIAGNOSIS — E785 Hyperlipidemia, unspecified: Secondary | ICD-10-CM | POA: Insufficient documentation

## 2017-12-11 DIAGNOSIS — M25562 Pain in left knee: Secondary | ICD-10-CM | POA: Diagnosis not present

## 2017-12-11 DIAGNOSIS — Z79899 Other long term (current) drug therapy: Secondary | ICD-10-CM | POA: Insufficient documentation

## 2017-12-11 DIAGNOSIS — Z87891 Personal history of nicotine dependence: Secondary | ICD-10-CM | POA: Insufficient documentation

## 2017-12-11 DIAGNOSIS — I9581 Postprocedural hypotension: Secondary | ICD-10-CM | POA: Diagnosis not present

## 2017-12-11 DIAGNOSIS — Z7901 Long term (current) use of anticoagulants: Secondary | ICD-10-CM | POA: Insufficient documentation

## 2017-12-11 DIAGNOSIS — M25511 Pain in right shoulder: Secondary | ICD-10-CM | POA: Insufficient documentation

## 2017-12-11 DIAGNOSIS — R569 Unspecified convulsions: Secondary | ICD-10-CM | POA: Diagnosis not present

## 2017-12-11 DIAGNOSIS — M792 Neuralgia and neuritis, unspecified: Secondary | ICD-10-CM

## 2017-12-11 DIAGNOSIS — M4316 Spondylolisthesis, lumbar region: Secondary | ICD-10-CM | POA: Insufficient documentation

## 2017-12-11 DIAGNOSIS — M1288 Other specific arthropathies, not elsewhere classified, other specified site: Secondary | ICD-10-CM | POA: Insufficient documentation

## 2017-12-11 DIAGNOSIS — G629 Polyneuropathy, unspecified: Secondary | ICD-10-CM | POA: Diagnosis not present

## 2017-12-11 DIAGNOSIS — M069 Rheumatoid arthritis, unspecified: Secondary | ICD-10-CM | POA: Diagnosis not present

## 2017-12-11 DIAGNOSIS — Z7982 Long term (current) use of aspirin: Secondary | ICD-10-CM | POA: Insufficient documentation

## 2017-12-11 DIAGNOSIS — M5442 Lumbago with sciatica, left side: Secondary | ICD-10-CM

## 2017-12-11 MED ORDER — GABAPENTIN 300 MG PO CAPS: 300.0000 mg | ORAL_CAPSULE | Freq: Every day | ORAL | 1 refills | Status: DC

## 2017-12-11 NOTE — Patient Instructions (Signed)
Prescription sent to [facility].

## 2018-02-20 ENCOUNTER — Other Ambulatory Visit: Payer: Self-pay

## 2018-02-20 ENCOUNTER — Encounter: Payer: Self-pay | Admitting: Emergency Medicine

## 2018-02-20 ENCOUNTER — Inpatient Hospital Stay
Admission: EM | Admit: 2018-02-20 | Discharge: 2018-02-24 | DRG: 554 | Disposition: A | Discharge status: Skilled Nursing Facility | Payer: Medicare Other | Attending: Internal Medicine | Admitting: Internal Medicine

## 2018-02-20 ENCOUNTER — Emergency Department: Payer: Medicare Other

## 2018-02-20 DIAGNOSIS — Z881 Allergy status to other antibiotic agents status: Secondary | ICD-10-CM

## 2018-02-20 DIAGNOSIS — I890 Lymphedema, not elsewhere classified: Secondary | ICD-10-CM | POA: Diagnosis present

## 2018-02-20 DIAGNOSIS — E119 Type 2 diabetes mellitus without complications: Secondary | ICD-10-CM

## 2018-02-20 DIAGNOSIS — E785 Hyperlipidemia, unspecified: Secondary | ICD-10-CM | POA: Diagnosis present

## 2018-02-20 DIAGNOSIS — Z96641 Presence of right artificial hip joint: Secondary | ICD-10-CM | POA: Diagnosis present

## 2018-02-20 DIAGNOSIS — M25562 Pain in left knee: Secondary | ICD-10-CM | POA: Diagnosis present

## 2018-02-20 DIAGNOSIS — M11262 Other chondrocalcinosis, left knee: Secondary | ICD-10-CM | POA: Diagnosis present

## 2018-02-20 DIAGNOSIS — Z87891 Personal history of nicotine dependence: Secondary | ICD-10-CM

## 2018-02-20 DIAGNOSIS — K5900 Constipation, unspecified: Secondary | ICD-10-CM | POA: Diagnosis present

## 2018-02-20 DIAGNOSIS — I10 Essential (primary) hypertension: Secondary | ICD-10-CM | POA: Diagnosis present

## 2018-02-20 DIAGNOSIS — Z7982 Long term (current) use of aspirin: Secondary | ICD-10-CM | POA: Diagnosis not present

## 2018-02-20 DIAGNOSIS — Z23 Encounter for immunization: Secondary | ICD-10-CM

## 2018-02-20 DIAGNOSIS — Z96651 Presence of right artificial knee joint: Secondary | ICD-10-CM | POA: Diagnosis present

## 2018-02-20 DIAGNOSIS — R339 Retention of urine, unspecified: Secondary | ICD-10-CM | POA: Diagnosis present

## 2018-02-20 DIAGNOSIS — Z91040 Latex allergy status: Secondary | ICD-10-CM

## 2018-02-20 DIAGNOSIS — Z79899 Other long term (current) drug therapy: Secondary | ICD-10-CM | POA: Diagnosis not present

## 2018-02-20 DIAGNOSIS — M069 Rheumatoid arthritis, unspecified: Secondary | ICD-10-CM | POA: Diagnosis present

## 2018-02-20 DIAGNOSIS — K219 Gastro-esophageal reflux disease without esophagitis: Secondary | ICD-10-CM | POA: Diagnosis present

## 2018-02-20 DIAGNOSIS — R531 Weakness: Secondary | ICD-10-CM

## 2018-02-20 DIAGNOSIS — Z794 Long term (current) use of insulin: Secondary | ICD-10-CM | POA: Diagnosis not present

## 2018-02-20 DIAGNOSIS — Z880 Allergy status to penicillin: Secondary | ICD-10-CM | POA: Diagnosis not present

## 2018-02-20 DIAGNOSIS — M1990 Unspecified osteoarthritis, unspecified site: Secondary | ICD-10-CM

## 2018-02-20 LAB — GRAM STAIN: GRAM STAIN: NONE SEEN

## 2018-02-20 LAB — URINALYSIS, ROUTINE W REFLEX MICROSCOPIC
Bilirubin Urine: NEGATIVE
Glucose, UA: NEGATIVE mg/dL
Hgb urine dipstick: NEGATIVE
Ketones, ur: NEGATIVE mg/dL
Leukocytes, UA: NEGATIVE
Nitrite: NEGATIVE
PROTEIN: NEGATIVE mg/dL
SPECIFIC GRAVITY, URINE: 1.026 (ref 1.005–1.030)
pH: 6 (ref 5.0–8.0)

## 2018-02-20 LAB — COMPREHENSIVE METABOLIC PANEL
ALT: 13 U/L (ref 0–44)
AST: 18 U/L (ref 15–41)
Albumin: 3.7 g/dL (ref 3.5–5.0)
Alkaline Phosphatase: 77 U/L (ref 38–126)
Anion gap: 6 (ref 5–15)
BILIRUBIN TOTAL: 0.8 mg/dL (ref 0.3–1.2)
BUN: 14 mg/dL (ref 8–23)
CALCIUM: 9.1 mg/dL (ref 8.9–10.3)
CHLORIDE: 100 mmol/L (ref 98–111)
CO2: 31 mmol/L (ref 22–32)
CREATININE: 0.92 mg/dL (ref 0.61–1.24)
GFR calc Af Amer: >60 mL/min (ref >60)
Glucose, Bld: 112 mg/dL — ABNORMAL HIGH (ref 70–99)
Potassium: 3.4 mmol/L — ABNORMAL LOW (ref 3.5–5.1)
Sodium: 137 mmol/L (ref 135–145)
TOTAL PROTEIN: 6.4 g/dL — AB (ref 6.5–8.1)

## 2018-02-20 LAB — TROPONIN I

## 2018-02-20 LAB — CBC
HCT: 33.9 % — ABNORMAL LOW (ref 40.0–52.0)
Hemoglobin: 11.8 g/dL — ABNORMAL LOW (ref 13.0–18.0)
MCH: 30.5 pg (ref 26.0–34.0)
MCHC: 34.7 g/dL (ref 32.0–36.0)
MCV: 87.9 fL (ref 80.0–100.0)
PLATELETS: 230 10*3/uL (ref 150–440)
RBC: 3.86 MIL/uL — AB (ref 4.40–5.90)
RDW: 17.2 % — AB (ref 11.5–14.5)
WBC: 5.3 10*3/uL (ref 3.8–10.6)

## 2018-02-20 LAB — SYNOVIAL CELL COUNT + DIFF, W/ CRYSTALS
CRYSTALS FLUID: NONE SEEN
Eosinophils-Synovial: 0 %
Lymphocytes-Synovial Fld: 0 %
Monocyte-Macrophage-Synovial Fluid: 8 %
NEUTROPHIL, SYNOVIAL: 92 %
WBC, Synovial: 24250 /mm3 — ABNORMAL HIGH (ref 0–200)

## 2018-02-20 LAB — GLUCOSE, CAPILLARY: Glucose-Capillary: 89 mg/dL (ref 70–99)

## 2018-02-20 MED ORDER — PNEUMOCOCCAL VAC POLYVALENT 25 MCG/0.5ML IJ INJ
0.5000 mL | INJECTION | INTRAMUSCULAR | Status: AC
  Administered 2018-02-21: 0.5 mL via INTRAMUSCULAR
  Filled 2018-02-20: qty 0.5

## 2018-02-20 MED ORDER — PANTOPRAZOLE SODIUM 40 MG PO TBEC
40.0000 mg | DELAYED_RELEASE_TABLET | Freq: Every day | ORAL | Status: DC
  Administered 2018-02-20 – 2018-02-24 (×5): 40 mg via ORAL
  Filled 2018-02-20 (×5): qty 1

## 2018-02-20 MED ORDER — TRAZODONE HCL 50 MG PO TABS
50.0000 mg | ORAL_TABLET | Freq: Every day | ORAL | Status: DC
  Administered 2018-02-20 – 2018-02-23 (×4): 50 mg via ORAL
  Filled 2018-02-20 (×4): qty 1

## 2018-02-20 MED ORDER — LIDOCAINE HCL (PF) 1 % IJ SOLN
INTRAMUSCULAR | Status: AC
  Filled 2018-02-20: qty 5

## 2018-02-20 MED ORDER — SODIUM CHLORIDE 0.9 % IV SOLN
INTRAVENOUS | Status: DC
  Administered 2018-02-20: via INTRAVENOUS

## 2018-02-20 MED ORDER — ENOXAPARIN SODIUM 40 MG/0.4ML ~~LOC~~ SOLN
40.0000 mg | SUBCUTANEOUS | Status: DC
  Administered 2018-02-20 – 2018-02-23 (×4): 40 mg via SUBCUTANEOUS
  Filled 2018-02-20 (×4): qty 0.4

## 2018-02-20 MED ORDER — ONDANSETRON HCL 4 MG/2ML IJ SOLN: 4.0000 mg | Freq: Four times a day (QID) | INTRAMUSCULAR | Status: DC | PRN

## 2018-02-20 MED ORDER — ONDANSETRON HCL 4 MG PO TABS
4.0000 mg | ORAL_TABLET | Freq: Four times a day (QID) | ORAL | Status: DC | PRN
  Administered 2018-02-24: 4 mg via ORAL
  Filled 2018-02-20: qty 1

## 2018-02-20 MED ORDER — INSULIN ASPART 100 UNIT/ML ~~LOC~~ SOLN: 0.0000 [IU] | Freq: Three times a day (TID) | SUBCUTANEOUS | Status: DC

## 2018-02-20 MED ORDER — NAPROXEN 500 MG PO TABS
ORAL_TABLET | ORAL | Status: AC
  Administered 2018-02-20: 500 mg via ORAL
  Filled 2018-02-20: qty 1

## 2018-02-20 MED ORDER — VANCOMYCIN HCL IN DEXTROSE 1-5 GM/200ML-% IV SOLN
1000.0000 mg | Freq: Once | INTRAVENOUS | Status: AC
  Administered 2018-02-20: 1000 mg via INTRAVENOUS
  Filled 2018-02-20: qty 200

## 2018-02-20 MED ORDER — GABAPENTIN 300 MG PO CAPS
300.0000 mg | ORAL_CAPSULE | Freq: Every day | ORAL | Status: DC
  Administered 2018-02-20 – 2018-02-23 (×4): 300 mg via ORAL
  Filled 2018-02-20 (×4): qty 1

## 2018-02-20 MED ORDER — ASPIRIN 81 MG PO CHEW
81.0000 mg | CHEWABLE_TABLET | Freq: Every day | ORAL | Status: DC
  Administered 2018-02-20 – 2018-02-24 (×5): 81 mg via ORAL
  Filled 2018-02-20 (×5): qty 1

## 2018-02-20 MED ORDER — ACETAMINOPHEN 650 MG RE SUPP: 650.0000 mg | Freq: Four times a day (QID) | RECTAL | Status: DC | PRN

## 2018-02-20 MED ORDER — ACETAMINOPHEN 325 MG PO TABS
650.0000 mg | ORAL_TABLET | Freq: Four times a day (QID) | ORAL | Status: DC | PRN
  Administered 2018-02-24: 650 mg via ORAL
  Filled 2018-02-20: qty 2

## 2018-02-20 MED ORDER — HYDROXYCHLOROQUINE SULFATE 200 MG PO TABS
400.0000 mg | ORAL_TABLET | Freq: Every day | ORAL | Status: DC
  Administered 2018-02-20 – 2018-02-23 (×4): 400 mg via ORAL
  Filled 2018-02-20 (×4): qty 2

## 2018-02-20 MED ORDER — INSULIN ASPART 100 UNIT/ML ~~LOC~~ SOLN: 0.0000 [IU] | Freq: Every day | SUBCUTANEOUS | Status: DC

## 2018-02-20 MED ORDER — NAPROXEN 500 MG PO TABS
500.0000 mg | ORAL_TABLET | Freq: Two times a day (BID) | ORAL | Status: DC
  Administered 2018-02-20 – 2018-02-24 (×8): 500 mg via ORAL
  Filled 2018-02-20 (×8): qty 1

## 2018-02-20 MED ORDER — OXYCODONE HCL 5 MG PO TABS
5.0000 mg | ORAL_TABLET | ORAL | Status: DC | PRN
  Administered 2018-02-20 – 2018-02-24 (×12): 5 mg via ORAL
  Filled 2018-02-20 (×12): qty 1

## 2018-02-20 MED ORDER — LAMOTRIGINE 100 MG PO TABS
200.0000 mg | ORAL_TABLET | Freq: Two times a day (BID) | ORAL | Status: DC
  Administered 2018-02-20 – 2018-02-24 (×7): 200 mg via ORAL
  Filled 2018-02-20: qty 2
  Filled 2018-02-20: qty 8
  Filled 2018-02-20 (×3): qty 2
  Filled 2018-02-20: qty 8
  Filled 2018-02-20 (×3): qty 2
  Filled 2018-02-20: qty 8
  Filled 2018-02-20: qty 2
  Filled 2018-02-20: qty 8
  Filled 2018-02-20 (×2): qty 2

## 2018-02-20 NOTE — Consult Note (Signed)
ORTHOPAEDIC CONSULTATION  REQUESTING PHYSICIAN: Harvest Dark, MD  Chief Complaint:   L knee pain  History of Present Illness: Jeremiah [name]. is a 77 y.o. male with a history of chronic L knee pain that has acutely worsened over the past 3 days. He has known L knee end-stage degenerative changes and has had to use a walker for the past ~2-3 weeks due to knee pain. However, he had an acute worsening ~3 days ago. He had some associated swelling. Of note, he has baseline lymphedema of bilateral lower extremities. He presented to the ED today due to pain and inability to ambulate. He lives at home with his wife. Pain is rated 10/10 and worse with attempted knee flexion and weightbearing. Pain improved at rest. Pain located diffusely about knee. He has not had any recent fevers, chills, or infections. He has not had recent joint injection. He has a history of rheumatoid arthritis and is on Enbrel and plaquenil.   He has a history of R TKA and R THA.   Past Medical History:  Diagnosis Date  . Allergy   . Arthritis   . Hypertension   . Rheumatoid arthritis ([place])   . Seizures ([place])   . Sepsis ([place]) 05/23/2015   Past Surgical History:  Procedure Laterality Date  . back surgery five times  04/2016  . CARPAL TUNNEL RELEASE    . HERNIA REPAIR    . JOINT REPLACEMENT    . knee replacement right    . right hip replacement    . rotator cuff surgery    . SPINE SURGERY     Social History   Socioeconomic History  . Marital status: Married    Spouse name: Not on file  . Number of children: Not on file  . Years of education: Not on file  . Highest education level: Not on file  Occupational History  . Not on file  Social Needs  . Financial resource strain: Not on file  . Food insecurity:    Worry: Not on file    Inability: Not on file  . Transportation needs:    Medical: Not on file    Non-medical: Not on file  Tobacco  Use  . Smoking status: Former Smoker    Packs/day: 1.00    Years: 20.00    Pack years: 20.00    Types: Cigarettes  . Smokeless tobacco: Never Used  Substance and Sexual Activity  . Alcohol use: No  . Drug use: Not on file  . Sexual activity: Not on file  Lifestyle  . Physical activity:    Days per week: Not on file    Minutes per session: Not on file  . Stress: Not on file  Relationships  . Social connections:    Talks on phone: Not on file    Gets together: Not on file    Attends religious service: Not on file    Active member of club or organization: Not on file    Attends meetings of clubs or organizations: Not on file    Relationship status: Not on file  Other Topics Concern  . Not on file  Social History Narrative  . Not on file   Family History  Problem Relation Age of Onset  . Hypertension Other   . Hypertension Mother   . Dementia Mother   . Dementia Father    Allergies  Allergen Reactions  . Cefoperazone Anaphylaxis  . Penicillins Anaphylaxis and Other (See Comments)  Has patient had a PCN reaction causing immediate rash, facial/tongue/throat swelling, SOB or lightheadedness with hypotension: Yes Has patient had a PCN reaction causing severe rash involving mucus membranes or skin necrosis: No Has patient had a PCN reaction that required hospitalization No Has patient had a PCN reaction occurring within the last 10 years: No If all of the above answers are "NO", then may proceed with Cephalosporin use.  . Latex Rash   Prior to Admission medications   Medication Sig Start Date End Date Taking? Authorizing Provider  aspirin 81 MG chewable tablet Chew 81 mg by mouth daily.    [provider]  Cinnamon 500 MG capsule Take 500 mg by mouth 2 (two) times daily.    [provider]  etanercept (ENBREL SURECLICK) 50 MG/ML injection Inject 50 mg into the skin once a week.     [provider]  folic acid (FOLVITE) 188 MCG tablet Take 800  mcg by mouth daily.    [provider]  gabapentin (NEURONTIN) 300 MG capsule Take 1 capsule (300 mg total) by mouth at bedtime. 12/11/17 06/09/18  Milinda Pointer, MD  hydroxychloroquine (PLAQUENIL) 200 MG tablet Take 400 mg by mouth at bedtime.    [provider]  lamoTRIgine (LAMICTAL) 200 MG tablet Take 200 mg by mouth 2 (two) times daily.    [provider]  losartan-hydrochlorothiazide (HYZAAR) 50-12.5 MG tablet Take 1 tablet by mouth daily.    [provider]  magnesium oxide (MAG-OX) 400 MG tablet Take 400 mg by mouth daily.    [provider]  methotrexate (RHEUMATREX) 2.5 MG tablet Take 25 mg by mouth once a week. Pt takes on Monday.    [provider]  omeprazole (PRILOSEC) 20 MG capsule Take 40 mg by mouth as needed.     [provider]  traZODone (DESYREL) 50 MG tablet Take 50 mg by mouth at bedtime.    [provider]   Recent Labs    02/20/18 1249  WBC 5.3  HGB 11.8*  HCT 33.9*  PLT 230  K 3.4*  CL 100  CO2 31  BUN 14  CREATININE 0.92  GLUCOSE 112*  CALCIUM 9.1    L Knee joint Aspirate (from ED): Component     Latest Ref Rng & Units 02/20/2018  Color, Synovial     YELLOW YELLOW (A)  Appearance-Synovial     CLEAR CLOUDY (A)  Crystals, Fluid      NO CRYSTALS SEEN  WBC, Synovial     0 - 200 /cu mm 24,250 (H)  Neutrophil, Synovial     % 92  Lymphocytes-Synovial Fld     % 0  Monocyte-Macrophage-Synovial Fluid     % 8  Eosinophils-Synovial     % 0    Gram Stain - No organisms seen. WBCs present.     Dg Chest 2 View  Result Date: 02/20/2018 CLINICAL DATA:  Lower extremity edema.  Hypertension. EXAM: CHEST - 2 VIEW COMPARISON:  May 23, 2015 FINDINGS: There is no edema or consolidation. The heart size and pulmonary vascularity are normal. No adenopathy. There is degenerative change in each shoulder with postoperative change in the right humeral head region. There is suspected  avascular necrosis in the visualized portion of humeral head. IMPRESSION: No edema or consolidation. Stable cardiac silhouette. Question avascular necrosis in visualized right humeral head. Extensive arthropathy in both shoulders noted. Electronically Signed   By: Lowella Grip III M.D.   On: 02/20/2018 13:43  Dg Knee Complete 4 Views Left  Result Date: 02/20/2018 CLINICAL DATA:  Left knee pain, swelling and redness. Multiple recent falls. EXAM: LEFT KNEE - COMPLETE 4+ VIEW COMPARISON:  None. FINDINGS: Moderate to marked lateral joint space narrowing and mild to moderate spur formation. Mild medial and patellofemoral spur formation. Posterior patellar and suprapatellar loose bodies. No effusion. Medial and lateral meniscal calcifications. Mild lateral subluxation of the tibia relative to the distal femur. No fracture or dislocation. IMPRESSION: 1. Degenerative changes, as described above. 2. Chondrocalcinosis. 3. Loose bodies. Electronically Signed   By: Claudie Revering M.D.   On: 02/20/2018 18:11     Positive ROS: All other systems have been reviewed and were otherwise negative with the exception of those mentioned in the HPI and as above.  Physical Exam: BP (!) 129/91   Pulse 97   Temp 98.3 F (36.8 C) (Oral)   Resp 18   Ht 6' (1.829 m)   Wt 108.9 kg   SpO2 98%   BMI 32.55 kg/m  General:  Alert, no acute distress Psychiatric:  Patient is competent for consent with normal mood and affect   Cardiovascular:  No pedal edema, regular rate and rhythm Respiratory:  No wheezing, non-labored breathing GI:  Abdomen is soft and non-tender Skin:  No lesions in the area of chief complaint, no erythema Neurologic:  Sensation intact distally, CN grossly intact Lymphatic:  No axillary or cervical lymphadenopathy  Orthopedic Exam:  LLE: 5/5 DF/PF/EHL SILT s/s/t/sp/dp distr Foot wwp RoM Knee: 10-90, severe pain with attempted RoM > 90 but relatively pain-free motion within this defined range.   Significant diffuse lower extremity swelling consistent with lymphedema.  Some warmth to L knee vs contralateral knee. Mild erythema. Effusion of L knee present.  Significantly tender to palpation diffusely, including medial joint line, lateral joint line, and patella.    X-rays:  As above: severe degenerative changes, especially to lateral compartment with chondrocalcinosis present.  Assessment/Plan: 77 yo M w/severe L knee pain, likely due to pseudogout. Joint aspirate cell count consistent with inflammatory process, but not necessarily infectious. Additionally, there is chondrocalcinosis on radiographs. Septic arthritis is less likely, but still possible. 1. Recommend treating for pseudogout with NSAIDs (can use naproxen 500mg  bid).  2. Would start on empiric antibiotics until joint aspirate cultures are negative. He has severe allergies to penicillin and cephalosporins.  3. Keep NPO after midnight in case symptoms worsen 4. Admit to hospitalist team 5. Will plan to re-evaluate in AM.    Leim Fabry   02/20/2018 7:16 PM

## 2018-02-20 NOTE — Clinical Social Work Note (Signed)
Clinical Social Work Assessment  Patient Details  Name: Jeremiah [name]. MRN: [id number] Date of Birth: 1941/05/15  Date of referral:  02/20/18               Reason for consult:  Facility Placement                Permission sought to share information with:  Facility Sport and exercise psychologist, Family Supports Permission granted to share information::  Yes, Verbal Permission Granted  Name::     Flynn, Gwyn Spouse [phone]  [phone]   Agency::  SNF admissions  Relationship::     Contact Information:     Housing/Transportation Living arrangements for the past 2 months:  Single Family Home Source of Information:  Patient, Spouse Patient Interpreter Needed:  None Criminal Activity/Legal Involvement Pertinent to Current Situation/Hospitalization:  No - Comment as needed Significant Relationships:  Adult Children, Spouse Lives with:  Spouse Do you feel safe going back to the place where you live?  No Need for family participation in patient care:  No (Coment)  Care giving concerns: Patient and wife feel that he needs some short term rehab before he is able to return back home.   Social Worker assessment / plan:  Patient is a 77 year old male who is alert and oriented x4.  Patient lives with his wife, and has not been to rehab at [facility] before.  CSW explained to patient and his wife, about role and process of trying to find SNF placement.  CSW explained what to expect at SNF, and how to coordinate getting to SNF.  Patient and his wife live in [place] and are interested in possibly going to [facility], [facility], or [facility].  CSW explained to patient how insurance will pay for stay, and what the advantages are of going to SNF verse home health.  Patient and his wife gave CSW permission to begin bed search in [place].  Patient and wife did not have any other questions or concerns.  Employment status:  Retired Nurse, adult PT Recommendations:  [place] / Referral to community resources:  [place]  Patient/Family's Response to care:  Patient and wife agreeable to going to SNF for short there rehab.  Patient/Family's Understanding of and Emotional Response to Diagnosis, Current Treatment, and Prognosis:  Patient and wife are hopeful that the physician can figure out what is affecting his knee, so he can go to rehab then return home.  Emotional Assessment Appearance:  Appears stated age Attitude/Demeanor/Rapport:    Affect (typically observed):  Appropriate Orientation:  Oriented to Self, Oriented to Place, Oriented to  Time, Oriented to Situation Alcohol / Substance use:  Not Applicable Psych involvement (Current and /or in the community):  No (Comment)  Discharge Needs  Concerns to be addressed:  Lack of Support Readmission within the last 30 days:  No Current discharge risk:  Lack of support system Barriers to Discharge:  Continued Medical Work up   Anell Barr 02/20/2018, 7:39 PM

## 2018-02-20 NOTE — ED Notes (Signed)
Report called  

## 2018-02-20 NOTE — ED Triage Notes (Signed)
Patient presents to the ED via EMS from home.  Patient presents for a change in mobility status.  Yesterday patient was able to walk with his walker, today patient is unable to stand from a chair.  Patient was seen at the [facility] yesterday for lymphadema in his feet.  Feet appear very swollen and reddened at this time.  Patient is also complaining of bilateral knee pain.  Patient is alert and oriented x 4.

## 2018-02-20 NOTE — Evaluation (Signed)
Physical Therapy Evaluation Patient Details Name: Jeremiah [name]. MRN: [id number] DOB: [birth year] Today's Date: 02/20/2018   History of Present Illness  presented to ER secondary to progressive LE pain, weakness and inability to ambulate, care for self in home environment.  Clinical Impression  Upon evaluation, patient alert and oriented; follows commands and demonstrates good effort/willingness to participate with session.  LEs noted to be grossly edematous, L >R, with redness/erythema noted bilat feet, L > R.  L LE generally tender to deep palpation over L lateral hip, medial/lateral knee joint line and plantar/dorsal surface of L foot.  Significant ROM limitations noted (improved with attempts at purely passive movement) due to pain throughout L extremity.  Unable to complete transition from supine to sitting edge of bed due to profound pain in L LE (10/10) with movement only 3-4" from neutral position; additional activity deferred as result.  Will continue to assess and progress mobility as pain better controlled and patient able to tolerate. At this time, unable to demonstrate ability to safely and successfully complete mobility tasks required to facilitate discharge home with wife. Would benefit from skilled PT to address above deficits and promote optimal return to PLOF; recommend transition to STR upon discharge from acute hospitalization.     Follow Up Recommendations SNF    Equipment Recommendations       Recommendations for Other Services       Precautions / Restrictions Precautions Precautions: Fall Restrictions Weight Bearing Restrictions: No      Mobility  Bed Mobility               General bed mobility comments: unable to tolerate due to pain in L > R LE  Transfers                 General transfer comment: unable to tolerate due to pain in L > R LE  Ambulation/Gait             General Gait Details: unable to tolerate due to pain in L > R  LE  Stairs            Wheelchair Mobility    Modified Rankin (Stroke Patients Only)       Balance                                             Pertinent Vitals/Pain Pain Assessment: Faces Pain Score: 10-Worst pain ever Pain Location: L LE, hip, knee and ankle Pain Descriptors / Indicators: Aching;Grimacing;Guarding Pain Intervention(s): Limited activity within patient's tolerance;Monitored during session;Repositioned    Home Living Family/patient expects to be discharged to:: Private residence Living Arrangements: Spouse/significant other Available Help at Discharge: Family Type of Home: House Home Access: Stairs to enter Entrance Stairs-Rails: None Entrance Stairs-Number of Steps: 1 Home Layout: One level Home Equipment: Environmental consultant - 2 wheels(walking stick)      Prior Function Level of Independence: Independent with assistive device(s)         Comments: Mod indep with walking stick vs RW for ADLs, household mobilization; prefers sleeping in recliner.  Under management for chronic LE lymphadema via [place] [facility].  Does endorse at least 4 falls since May/June.     Hand Dominance        Extremity/Trunk Assessment   Upper Extremity Assessment Upper Extremity Assessment: Overall WFL for tasks assessed    Lower Extremity Assessment  Lower Extremity Assessment: (L LE grossly 3-/5, significantly limited by pain; R LE grossly 4-/5.  Grossly edematous throughout entire extremity L > R)       Communication   Communication: No difficulties  Cognition Arousal/Alertness: Awake/alert Behavior During Therapy: WFL for tasks assessed/performed Overall Cognitive Status: Within Functional Limits for tasks assessed                                        General Comments      Exercises Other Exercises Other Exercises: Tender to palpation over L lateral hip, medial/lateral knee joint line and plantar/dorsal surface of metatarsals.   Tolerates greater degree of movement when moved passively (vs actively) in L LE; unable to fully extend L knee or tolerate transition towards edge of bed greater than 3-4"   Assessment/Plan    PT Assessment Patient needs continued PT services  PT Problem List Decreased strength;Decreased range of motion;Decreased activity tolerance;Decreased balance;Decreased mobility;Impaired sensation;Decreased knowledge of use of DME;Pain       PT Treatment Interventions DME instruction;Gait training;Stair training;Functional mobility training;Therapeutic activities;Therapeutic exercise;Balance training;Patient/family education    PT Goals (Current goals can be found in the Care Plan section)  Acute Rehab PT Goals Patient Stated Goal: to make this pain better PT Goal Formulation: With patient/family Time For Goal Achievement: 03/06/18 Potential to Achieve Goals: Good    Frequency Min 2X/week   Barriers to discharge Decreased caregiver support      Co-evaluation               AM-PAC PT "6 Clicks" Daily Activity  Outcome Measure Difficulty turning over in bed (including adjusting bedclothes, sheets and blankets)?: Unable Difficulty moving from lying on back to sitting on the side of the bed? : Unable Difficulty sitting down on and standing up from a chair with arms (e.g., wheelchair, bedside commode, etc,.)?: Unable Help needed moving to and from a bed to chair (including a wheelchair)?: Total Help needed walking in hospital room?: Total Help needed climbing 3-5 steps with a railing? : Total 6 Click Score: 6    End of Session   Activity Tolerance: Patient limited by pain Patient left: in bed;with family/visitor present Nurse Communication: Mobility status PT Visit Diagnosis: Muscle weakness (generalized) (M62.81);Difficulty in walking, not elsewhere classified (R26.2);History of falling (Z91.81);Pain Pain - Right/Left: Left Pain - part of body: Leg    Time: 1425-1440 PT Time  Calculation (min) (ACUTE ONLY): 15 min   Charges:   PT Evaluation $PT Eval Moderate Complexity: 1 Mod          Brennen Camper H. Owens Shark, PT, DPT, NCS 02/20/18, 2:57 PM [phone]

## 2018-02-20 NOTE — ED Notes (Signed)
Pt attempted to urinate in urinal. Unable. Will try again later.

## 2018-02-20 NOTE — NC FL2 (Signed)
[place] LEVEL OF CARE SCREENING TOOL     IDENTIFICATION  Patient Name: Jeremiah [name]. Birthdate: 06/07/1940 Sex: male Admission Date (Current Location): 02/20/2018  [place] and [place] Number:  Engineering geologist and Address:  [facility], [address] [place], [place] [address]      Provider Number: [id number]  Attending Physician Name and Address:  Harvest Dark, MD  Relative Name and Phone Number:  Dajohn, Ellender Spouse [phone]  [phone] or Majid, Mccravy   [phone]     Current Level of Care: Hospital Recommended Level of Care: [place] Prior Approval Number:    Date Approved/Denied:   PASRR Number: [id number]  Discharge Plan: SNF    Current Diagnoses: Patient Active Problem List   Diagnosis Date Noted  . Neurogenic pain 10/02/2017  . Spondylosis without myelopathy or radiculopathy, lumbosacral region 07/17/2017  . Trigger point with back pain (recurrent/intermittent) 07/17/2017  . Chronic musculoskeletal pain 02/27/2017  . Myofascial pain syndrome 02/27/2017  . Long term current use of anticoagulant (Plaquenil) 02/27/2017  . Chronic lumbar radiculitis (L5 dermatome) (Right) 02/27/2017  . Chronic lumbar radiculitis (L3 dermatome) (Left) 02/27/2017  . Chronic foot pain (Right) 02/27/2017  . Chronic thoracic back pain (Bilateral) (R>L) 02/27/2017  . Chronic neck pain (posterior) (Bilateral) (R>L) 02/27/2017  . Chronic shoulder pain (Bilateral) (L>R) 02/27/2017  . 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 DDD (degenerative disc disease), lumbar 02/19/2017  . 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 Chronic knee pain (Bilateral) (R>L) 02/19/2017  . 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 Grade 1 Retrolisthesis of L1 over L2 02/19/2017  . T11 wedge compression fracture ([employer]prox. 1993) 02/19/2017  . Lumbar facet arthropathy 02/19/2017  .  Lumbar facet syndrome (Bilateral) 02/19/2017  . Lumbar foraminal stenosis (Bilateral: L1-2)  (Right: L3-4, L4-5) 02/19/2017  . 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 Disorder of skeletal system 02/19/2017  . Pharmacologic therapy 02/19/2017  . Problems influencing health status 02/19/2017  . Chronic pain syndrome 01/14/2017  . Gastroesophageal reflux disease 01/14/2017  . Hyperlipidemia 01/14/2017  . Lumbar radiculopathy 01/14/2017  . Postoperative anemia due to acute blood loss 01/14/2017  . Postprocedural hypotension 01/14/2017  . Chronic lower extremity pain (Secondary Area of Pain) (Bilateral) (R>L) 01/14/2017  . Rheumatoid arthritis ([place]) 12/17/2013  . Type II or unspecified type diabetes mellitus without mention of complication, not stated as uncontrolled 12/17/2013  . Benign essential hypertension 11/13/2013  . Chronic low back pain (Primary Area of Pain) (Bilateral) (R>L) 11/13/2013  . Prostatitis 11/13/2013  . Seizure ([place]) 11/13/2013  . Left bundle branch block 05/21/2013  . Dorsalgia 05/21/2013    Orientation RESPIRATION BLADDER Height & Weight     Self, Time, Situation, Place  Normal Continent Weight: 240 lb (108.9 kg) Height:  6' (182.9 cm)  BEHAVIORAL SYMPTOMS/MOOD NEUROLOGICAL BOWEL NUTRITION STATUS      Continent Diet(Regular diet)  AMBULATORY STATUS COMMUNICATION OF NEEDS Skin   Limited Assist   Normal                       Personal Care Assistance Level of Assistance  Bathing, Feeding, Dressing Bathing Assistance: Limited assistance Feeding assistance: Independent Dressing Assistance: Limited assistance     Functional Limitations Info  Sight, Hearing, Speech Sight Info: Adequate Hearing Info: Adequate Speech Info: Adequate    SPECIAL CARE FACTORS FREQUENCY  PT (By licensed PT), OT (By  licensed OT)     PT Frequency: 5x a week OT Frequency: 5x a week            Contractures Contractures Info: Not present    Additional  Factors Info  Code Status, Allergies Code Status Info: Full Code Allergies Info: CEFOPERAZONE, PENICILLINS, LATEX            Current Medications (02/20/2018):  This is the current hospital active medication list Current Facility-Administered Medications  Medication Dose Route Frequency Provider Last Rate Last Dose  . lidocaine (PF) (XYLOCAINE) 1 % injection            Current Outpatient Medications  Medication Sig Dispense Refill  . aspirin 81 MG chewable tablet Chew 81 mg by mouth daily.    . Cinnamon 500 MG capsule Take 500 mg by mouth 2 (two) times daily.    Marland Kitchen etanercept (ENBREL SURECLICK) 50 MG/ML injection Inject 50 mg into the skin once a week.     . folic acid (FOLVITE) 697 MCG tablet Take 800 mcg by mouth daily.    Marland Kitchen gabapentin (NEURONTIN) 300 MG capsule Take 1 capsule (300 mg total) by mouth at bedtime. 90 capsule 1  . hydroxychloroquine (PLAQUENIL) 200 MG tablet Take 400 mg by mouth at bedtime.    . lamoTRIgine (LAMICTAL) 200 MG tablet Take 200 mg by mouth 2 (two) times daily.    Marland Kitchen losartan-hydrochlorothiazide (HYZAAR) 50-12.5 MG tablet Take 1 tablet by mouth daily.    . magnesium oxide (MAG-OX) 400 MG tablet Take 400 mg by mouth daily.    . methotrexate (RHEUMATREX) 2.5 MG tablet Take 25 mg by mouth once a week. Pt takes on Monday.    Marland Kitchen omeprazole (PRILOSEC) 20 MG capsule Take 40 mg by mouth as needed.     . traZODone (DESYREL) 50 MG tablet Take 50 mg by mouth at bedtime.       Discharge Medications: Please see discharge summary for a list of discharge medications.  Relevant Imaging Results:  Relevant Lab Results:   Additional Information SSN [id number]  Ross Ludwig, [place]

## 2018-02-20 NOTE — ED Notes (Signed)
Pt seen yesterday for his lymphedema bilateral chronic. He states today having a hard time walking d/t the lymphedema and his chronic knee pain. Pt in 18h. vss

## 2018-02-20 NOTE — ED Provider Notes (Signed)
[facility] Emergency Department Provider Note  Time seen: 1:07 PM  I have reviewed the triage vital signs and the nursing notes.   HISTORY  Chief Complaint Leg Problem and Foot Problem    HPI Jeremiah [name]. is a 77 y.o. male with a past medical history of arthritis, hypertension, chronic pain, lymphedema, presents to the emergency department for weakness in his legs and inability to ambulate.  According to the wife and patient has a 3 days ago the patient was ambulating with his walker able to get up out of bed by himself ambulate unassisted.  Patient wife state over the past 3 days the patient has become progressively more weak in his legs and feels like his legs are going to give out.  He is now unable to get out of bed without significant assistance, wife states she was not able to get the patient up out of bed at all today to use the bathroom, etc.  Wife states they went to the [place] yesterday due to increased lymphedema over the past 1 week, they were told to use compression stockings and were discharged home.  Patient denies any shortness of breath.  Denies any chest pain.  Denies any fever.  Does state left knee pain although chronic he states it is worse over the past several days than it usually is.  Has an appointment with orthopedics actually today for evaluation but was unable to go because he could not get out of bed.   Past Medical History:  Diagnosis Date  . Allergy   . Arthritis   . Hypertension   . Rheumatoid arthritis ([place])   . Seizures ([place])   . Sepsis ([place]) 05/23/2015    Patient Active Problem List   Diagnosis Date Noted  . Neurogenic pain 10/02/2017  . Spondylosis without myelopathy or radiculopathy, lumbosacral region 07/17/2017  . Trigger point with back pain (recurrent/intermittent) 07/17/2017  . Chronic musculoskeletal pain 02/27/2017  . Myofascial pain syndrome 02/27/2017  . Long term current use of anticoagulant (Plaquenil)  02/27/2017  . Chronic lumbar radiculitis (L5 dermatome) (Right) 02/27/2017  . Chronic lumbar radiculitis (L3 dermatome) (Left) 02/27/2017  . Chronic foot pain (Right) 02/27/2017  . Chronic thoracic back pain (Bilateral) (R>L) 02/27/2017  . Chronic neck pain (posterior) (Bilateral) (R>L) 02/27/2017  . Chronic shoulder pain (Bilateral) (L>R) 02/27/2017  . 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 DDD (degenerative disc disease), lumbar 02/19/2017  . 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 Chronic knee pain (Bilateral) (R>L) 02/19/2017  . 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 Grade 1 Retrolisthesis of L1 over L2 02/19/2017  . T11 wedge compression fracture ([employer]. 1993) 02/19/2017  . Lumbar facet arthropathy 02/19/2017  . Lumbar facet syndrome (Bilateral) 02/19/2017  . Lumbar foraminal stenosis (Bilateral: L1-2)  (Right: L3-4, L4-5) 02/19/2017  . 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 Disorder of skeletal system 02/19/2017  . Pharmacologic therapy 02/19/2017  . Problems influencing health status 02/19/2017  . Chronic pain syndrome 01/14/2017  . Gastroesophageal reflux disease 01/14/2017  . Hyperlipidemia 01/14/2017  . Lumbar radiculopathy 01/14/2017  . Postoperative anemia due to acute blood loss 01/14/2017  . Postprocedural hypotension 01/14/2017  . Chronic lower extremity pain (Secondary Area of Pain) (Bilateral) (R>L) 01/14/2017  . Rheumatoid arthritis ([place]) 12/17/2013  . Type II or unspecified type diabetes mellitus without mention of complication, not stated as uncontrolled 12/17/2013  . Benign essential hypertension 11/13/2013  .  Chronic low back pain (Primary Area of Pain) (Bilateral) (R>L) 11/13/2013  . Prostatitis 11/13/2013  . Seizure ([place]) 11/13/2013  . Left bundle branch block 05/21/2013  . Dorsalgia 05/21/2013    Past Surgical History:  Procedure Laterality Date  . back surgery five times  04/2016  .  CARPAL TUNNEL RELEASE    . HERNIA REPAIR    . JOINT REPLACEMENT    . knee replacement right    . right hip replacement    . rotator cuff surgery    . SPINE SURGERY      Prior to Admission medications   Medication Sig Start Date End Date Taking? Authorizing Provider  aspirin 81 MG chewable tablet Chew 81 mg by mouth daily.    [provider]  Cinnamon 500 MG capsule Take 500 mg by mouth 2 (two) times daily.    [provider]  etanercept (ENBREL SURECLICK) 50 MG/ML injection Inject 50 mg into the skin once a week.     [provider]  folic acid (FOLVITE) 852 MCG tablet Take 800 mcg by mouth daily.    [provider]  gabapentin (NEURONTIN) 300 MG capsule Take 1 capsule (300 mg total) by mouth at bedtime. 12/11/17 06/09/18  Milinda Pointer, MD  hydroxychloroquine (PLAQUENIL) 200 MG tablet Take 400 mg by mouth at bedtime.    [provider]  lamoTRIgine (LAMICTAL) 200 MG tablet Take 200 mg by mouth 2 (two) times daily.    [provider]  losartan-hydrochlorothiazide (HYZAAR) 50-12.5 MG tablet Take 1 tablet by mouth daily.    [provider]  magnesium oxide (MAG-OX) 400 MG tablet Take 400 mg by mouth daily.    [provider]  methotrexate (RHEUMATREX) 2.5 MG tablet Take 25 mg by mouth once a week. Pt takes on Monday.    [provider]  omeprazole (PRILOSEC) 20 MG capsule Take 40 mg by mouth as needed.     [provider]  traZODone (DESYREL) 50 MG tablet Take 50 mg by mouth at bedtime.    [provider]    Allergies  Allergen Reactions  . Cefoperazone Anaphylaxis  . Penicillins Anaphylaxis and Other (See Comments)    Has patient had a PCN reaction causing immediate rash, facial/tongue/throat swelling, SOB or lightheadedness with hypotension: Yes Has patient had a PCN reaction causing severe rash involving mucus membranes or skin necrosis: No Has patient had a PCN reaction that required  hospitalization No Has patient had a PCN reaction occurring within the last 10 years: No If all of the above answers are "NO", then may proceed with Cephalosporin use.  . Latex Rash    Family History  Problem Relation Age of Onset  . Hypertension Other   . Hypertension Mother   . Dementia Mother   . Dementia Father     Social History Social History   Tobacco Use  . Smoking status: Former Smoker    Packs/day: 1.00    Years: 20.00    Pack years: 20.00    Types: Cigarettes  . Smokeless tobacco: Never Used  Substance Use Topics  . Alcohol use: No  . Drug use: Not on file    Review of Systems Constitutional: Negative for fever.  Positive for generalized weakness. Cardiovascular: Negative for chest pain. Respiratory: Negative for shortness of breath. Gastrointestinal: Negative for abdominal pain, vomiting Genitourinary: Negative for urinary compaints Musculoskeletal: Positive for weakness in his legs, positive for left knee pain.  Increased lower extremity swelling over the  past 1 week. Skin: Negative for skin complaints  Neurological: Negative for headache All other ROS negative  ____________________________________________   PHYSICAL EXAM:  VITAL SIGNS: ED Triage Vitals  Enc Vitals Group     BP 02/20/18 1214 110/67     Pulse Rate 02/20/18 1214 88     Resp 02/20/18 1214 18     Temp 02/20/18 1214 98.3 F (36.8 C)     Temp Source 02/20/18 1214 Oral     SpO2 02/20/18 1214 100 %     Weight 02/20/18 1215 240 lb (108.9 kg)     Height 02/20/18 1215 6' (1.829 m)     Head Circumference --      Peak Flow --      Pain Score 02/20/18 1214 8     Pain Loc --      Pain Edu? --      Excl. in [place]? --     Constitutional: Alert and oriented. Well appearing and in no distress. Eyes: Normal exam ENT   Head: Normocephalic and atraumatic.   Mouth/Throat: Mucous membranes are moist. Cardiovascular: Normal rate, regular rhythm. No murmur Respiratory: Normal respiratory  effort without tachypnea nor retractions. Breath sounds are clear  Gastrointestinal: Soft and nontender. No distention.  Musculoskeletal: Moderate left knee tenderness to palpation, mild effusion palpated.  Patient has significant 4+ lower extremity edema equal bilaterally but with mild erythema in the left lower extremity.  Extremities are warm to the touch. Neurologic:  Normal speech and language. No gross focal neurologic deficits  Skin:  Skin is warm, dry Psychiatric: Mood and affect are normal.   ____________________________________________    EKG  EKG reviewed and interpreted by myself shows normal sinus rhythm at 77 bpm with a narrow QRS, normal axis, normal intervals, no concerning ST changes.  ____________________________________________    RADIOLOGY  Chest x-ray shows no edema or consolidation. Knee x-ray shows degenerative changes.  ____________________________________________   INITIAL IMPRESSION / ASSESSMENT AND PLAN / ED COURSE  Pertinent labs & imaging results that were available during my care of the patient were reviewed by me and considered in my medical decision making (see chart for details).  Patient presents to the emergency department for left knee pain increased weakness especially in his legs over the last several days.  Differential is quite broad but would include arthritis, gout, deconditioning, electrolyte or metabolic abnormality, infectious etiology.  We will check labs, obtain urinalysis.  Given the patient's complaint of increased left knee pain will attempt to perform a joint aspiration for fluid removal, and send the sample to the lab for evaluation.  Patient agreeable to plan of care.  Patient's synovial fluid does show significant amount 24,000 white blood cells but no crystals.  Gram stain is negative.  I discussed the patient with orthopedics they have evaluated the patient, given the significant leukocytosis and significant pain with attempted  range of motion he believes it is either pseudogout versus septic arthritis.  Recommends starting the patient on IV antibiotics until his synovial fluid cultures grow out.  If they grow out negative we will continue treatment for pseudogout.  Wishes the patient to remain n.p.o. after midnight in case the possibility of a joint washout.  I will discuss with the hospitalist for admission to the hospital.  We will cover with vancomycin while awaiting culture results.  Apiration of blood/fluid Performed by: Harvest Dark Consent obtained. Required items: required blood products, implants, devices, and special equipment available Patient identity confirmed: verbally with patient  Time out: Immediately prior to procedure a "time out" was called to verify the correct patient, procedure, equipment, support staff and site/side marked as required. Preparation: Patient was prepped and draped in the usual sterile fashion. Patient tolerance: Patient tolerated the procedure well with no immediate complications.  Location of aspiration: Left knee aspiration     ____________________________________________   FINAL CLINICAL IMPRESSION(S) / ED DIAGNOSES  Weakness Left knee pain Inflammatory arthritis of the left knee   Harvest Dark, MD 02/20/18 2017

## 2018-02-20 NOTE — Clinical Social Work Note (Signed)
CSW received referral that patient will need SNF for short term rehab.  CSW met with patient and his wife to discuss SNF placement and process.  CSW informed patient that his information will be faxed out to different facilities, and then once there are bed offers, CSW will offer them choice on where to go.  After CSW has been informed on bed choice, insurance will have to approve patient which can take 24 hours.  Patient and his wife are agreeable to going to SNF for short term rehab pending insurance approval and bed offers.  CSW to continue to follow patient's progress throughout discharge planning.  Jones Broom. [place], MSW, [place]  02/20/2018 5:31 PM

## 2018-02-20 NOTE — H&P (Signed)
Jeremiah [name] at [place] NAME: Jeremiah [name]    MR#:  [id number]  DATE OF BIRTH:  [birth year]  DATE OF ADMISSION:  02/20/2018  PRIMARY CARE PHYSICIAN: Derinda Late, MD   REQUESTING/REFERRING PHYSICIAN: Kerman Passey, MD  CHIEF COMPLAINT:   Chief Complaint  Patient presents with  . Leg Problem  . Foot Problem    HISTORY OF PRESENT ILLNESS:  Jeremiah [name]  is a 77 y.o. male who presents with chief complaint as above.  Patient presents to the emergency department with history of progressively increasing left knee pain.  Patient has bilateral lower extremity lymphedema, but he stated his left knee started swelling more over the past several days.  Today it was acutely worse and he was unable to stand up on it and get around his house to perform his activities of daily living.  Here in the ED that joint is larger when compared to the right knee, warm to touch and mildly erythematous.  Arthrocentesis was performed by ED physician and showed around 25,000 white blood cells, greater than 90% neutrophils.  Orthopedic surgery also saw the patient in the ED and felt like this was likely pseudogout and recommended treating as such, but recommended treatment with antibiotics until cultures come back negative.  Hospitalist were called for admission  PAST MEDICAL HISTORY:   Past Medical History:  Diagnosis Date  . Allergy   . Arthritis   . Hypertension   . Rheumatoid arthritis ([place])   . Seizures ([place])   . Sepsis ([place]) 05/23/2015     PAST SURGICAL HISTORY:   Past Surgical History:  Procedure Laterality Date  . back surgery five times  04/2016  . CARPAL TUNNEL RELEASE    . HERNIA REPAIR    . JOINT REPLACEMENT    . knee replacement right    . right hip replacement    . rotator cuff surgery    . SPINE SURGERY       SOCIAL HISTORY:   Social History   Tobacco Use  . Smoking status: Former Smoker    Packs/day: 1.00    Years: 20.00    Pack  years: 20.00    Types: Cigarettes  . Smokeless tobacco: Never Used  Substance Use Topics  . Alcohol use: No     FAMILY HISTORY:   Family History  Problem Relation Age of Onset  . Hypertension Other   . Hypertension Mother   . Dementia Mother   . Dementia Father      DRUG ALLERGIES:   Allergies  Allergen Reactions  . Cefoperazone Anaphylaxis  . Penicillins Anaphylaxis and Other (See Comments)    Has patient had a PCN reaction causing immediate rash, facial/tongue/throat swelling, SOB or lightheadedness with hypotension: Yes Has patient had a PCN reaction causing severe rash involving mucus membranes or skin necrosis: No Has patient had a PCN reaction that required hospitalization No Has patient had a PCN reaction occurring within the last 10 years: No If all of the above answers are "NO", then may proceed with Cephalosporin use.  . Latex Rash    MEDICATIONS AT HOME:   Prior to Admission medications   Medication Sig Start Date End Date Taking? Authorizing Provider  aspirin 81 MG chewable tablet Chew 81 mg by mouth daily.   Yes [provider]  B Complex-C (B-COMPLEX WITH VITAMIN C) tablet Take 1 tablet by mouth daily.   Yes [provider]  Cinnamon 500 MG capsule Take 500  mg by mouth 2 (two) times daily.   Yes [provider]  etanercept (ENBREL SURECLICK) 50 MG/ML injection Inject 50 mg into the skin every Friday.    Yes [provider]  folic acid (FOLVITE) 932 MCG tablet Take 800 mcg by mouth daily.   Yes [provider]  gabapentin (NEURONTIN) 300 MG capsule Take 1 capsule (300 mg total) by mouth at bedtime. 12/11/17 06/09/18 Yes Milinda Pointer, MD  hydroxychloroquine (PLAQUENIL) 200 MG tablet Take 400 mg by mouth at bedtime.   Yes [provider]  lamoTRIgine (LAMICTAL) 200 MG tablet Take 200 mg by mouth 2 (two) times daily.   Yes [provider]  losartan-hydrochlorothiazide (HYZAAR) 50-12.5 MG tablet  Take 1 tablet by mouth daily.   Yes [provider]  magnesium oxide (MAG-OX) 400 MG tablet Take 400 mg by mouth daily.   Yes [provider]  methotrexate (RHEUMATREX) 2.5 MG tablet Take 25 mg by mouth every Monday.    Yes [provider]  omeprazole (PRILOSEC) 40 MG capsule Take 40 mg by mouth daily.    Yes [provider]  traZODone (DESYREL) 50 MG tablet Take 50 mg by mouth at bedtime.   Yes [provider]    REVIEW OF SYSTEMS:  Review of Systems  Constitutional: Negative for chills, fever, malaise/fatigue and weight loss.  HENT: Negative for ear pain, hearing loss and tinnitus.   Eyes: Negative for blurred vision, double vision, pain and redness.  Respiratory: Negative for cough, hemoptysis and shortness of breath.   Cardiovascular: Negative for chest pain, palpitations, orthopnea and leg swelling.  Gastrointestinal: Negative for abdominal pain, constipation, diarrhea, nausea and vomiting.  Genitourinary: Negative for dysuria, frequency and hematuria.  Musculoskeletal: Positive for joint pain (Left knee). Negative for back pain and neck pain.  Skin:       No acne, rash, or lesions  Neurological: Negative for dizziness, tremors, focal weakness and weakness.  Endo/Heme/Allergies: Negative for polydipsia. Does not bruise/bleed easily.  Psychiatric/Behavioral: Negative for depression. The patient is not nervous/anxious and does not have insomnia.      VITAL SIGNS:   Vitals:   02/20/18 1215 02/20/18 1600 02/20/18 1630 02/20/18 1700  BP:  122/72 127/69 (!) 129/91  Pulse:  85 92 97  Resp:      Temp:      TempSrc:      SpO2:  99% 99% 98%  Weight: 108.9 kg     Height: 6' (1.829 m)      Wt Readings from Last 3 Encounters:  02/20/18 108.9 kg  12/11/17 104.3 kg  10/08/17 106.6 kg    PHYSICAL EXAMINATION:  Physical Exam  Vitals reviewed. Constitutional: He is oriented to person, place, and time. He appears well-developed and  well-nourished. No distress.  HENT:  Head: Normocephalic and atraumatic.  Mouth/Throat: Oropharynx is clear and moist.  Eyes: Pupils are equal, round, and reactive to light. Conjunctivae and EOM are normal. No scleral icterus.  Neck: Normal range of motion. Neck supple. No JVD present. No thyromegaly present.  Cardiovascular: Normal rate, regular rhythm and intact distal pulses. Exam reveals no gallop and no friction rub.  No murmur heard. Respiratory: Effort normal and breath sounds normal. No respiratory distress. He has no wheezes. He has no rales.  GI: Soft. Bowel sounds are normal. He exhibits no distension. There is no tenderness.  Musculoskeletal: Normal range of motion. He exhibits edema (General bilateral lower extremity, left knee greater than right knee) and tenderness (Left  knee tenderness).  No arthritis, no gout  Lymphadenopathy:    He has no cervical adenopathy.  Neurological: He is alert and oriented to person, place, and time. No cranial nerve deficit.  No dysarthria, no aphasia  Skin: Skin is warm and dry. No rash noted. No erythema.  Psychiatric: He has a normal mood and affect. His behavior is normal. Judgment and thought content normal.    LABORATORY PANEL:   CBC Recent Labs  Lab 02/20/18 1249  WBC 5.3  HGB 11.8*  HCT 33.9*  PLT 230   ------------------------------------------------------------------------------------------------------------------  Chemistries  Recent Labs  Lab 02/20/18 1249  NA 137  K 3.4*  CL 100  CO2 31  GLUCOSE 112*  BUN 14  CREATININE 0.92  CALCIUM 9.1  AST 18  ALT 13  ALKPHOS 77  BILITOT 0.8   ------------------------------------------------------------------------------------------------------------------  Cardiac Enzymes Recent Labs  Lab 02/20/18 1249  TROPONINI <0.03   ------------------------------------------------------------------------------------------------------------------  RADIOLOGY:  Dg Chest 2  View  Result Date: 02/20/2018 CLINICAL DATA:  Lower extremity edema.  Hypertension. EXAM: CHEST - 2 VIEW COMPARISON:  May 23, 2015 FINDINGS: There is no edema or consolidation. The heart size and pulmonary vascularity are normal. No adenopathy. There is degenerative change in each shoulder with postoperative change in the right humeral head region. 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 IMPRESSION: No edema or consolidation. Stable cardiac silhouette. Question avascular necrosis in visualized right humeral head. Extensive arthropathy in both shoulders noted. Electronically Signed   By: Lowella Grip III M.D.   On: 02/20/2018 13:43   Dg Knee Complete 4 Views Left  Result Date: 02/20/2018 CLINICAL DATA:  Left knee pain, swelling and redness. Multiple recent falls. EXAM: LEFT KNEE - COMPLETE 4+ VIEW COMPARISON:  None. FINDINGS: Moderate to marked lateral joint space narrowing and mild to moderate spur formation. Mild medial and patellofemoral spur formation. Posterior patellar and suprapatellar loose bodies. No effusion. Medial and lateral meniscal calcifications. Mild lateral subluxation of the tibia relative to the distal femur. No fracture or dislocation. IMPRESSION: 1. Degenerative changes, as described above. 2. Chondrocalcinosis. 3. Loose bodies. Electronically Signed   By: Claudie Revering M.D.   On: 02/20/2018 18:11    EKG:   Orders placed or performed during the hospital encounter of 02/20/18  . ED EKG  . ED EKG    IMPRESSION AND PLAN:  Principal Problem:   Left knee pain -naproxen 500 mg twice daily with meals as treatment for pseudogout, synovial fluid cultures pending, cover with vancomycin for now for possible septic arthritis Active Problems:   Benign essential hypertension -continue home medications   Rheumatoid arthritis ([place]) -continue home dose meds   Diabetes ([facility]) -sliding scale insulin with corresponding glucose checks    Gastroesophageal reflux disease -Home dose PPI   Hyperlipidemia -Home dose antilipid  Chart review performed and case discussed with ED provider. Labs, imaging and/or ECG reviewed by provider and discussed with patient/family. Management plans discussed with the patient and/or family.  DVT PROPHYLAXIS: SubQ lovenox   GI PROPHYLAXIS:  PPI   ADMISSION STATUS: Inpatient     CODE STATUS: Full Code Status History    Date Active Date Inactive Code Status Order ID Comments User Context   05/23/2015 2221 05/26/2015 1741 Full Code [id number]  Hower, Aaron Mose, MD ED      TOTAL TIME TAKING CARE OF THIS PATIENT: 45 minutes.   Ruthann Angulo FIELDING 02/20/2018, 8:55 PM  [employer]  [phone]  CC: Primary care physician; Derinda Late, MD  Note:  This document was prepared using Dragon voice recognition software and may include unintentional dictation errors.

## 2018-02-21 LAB — CBC
HEMATOCRIT: 32 % — AB (ref 40.0–52.0)
HEMOGLOBIN: 11.1 g/dL — AB (ref 13.0–18.0)
MCH: 30.3 pg (ref 26.0–34.0)
MCHC: 34.8 g/dL (ref 32.0–36.0)
MCV: 87.1 fL (ref 80.0–100.0)
Platelets: 205 10*3/uL (ref 150–440)
RBC: 3.67 MIL/uL — ABNORMAL LOW (ref 4.40–5.90)
RDW: 16.8 % — ABNORMAL HIGH (ref 11.5–14.5)
WBC: 5.1 10*3/uL (ref 3.8–10.6)

## 2018-02-21 LAB — BASIC METABOLIC PANEL
ANION GAP: 7 (ref 5–15)
BUN: 12 mg/dL (ref 8–23)
CALCIUM: 8.2 mg/dL — AB (ref 8.9–10.3)
CO2: 28 mmol/L (ref 22–32)
Chloride: 101 mmol/L (ref 98–111)
Creatinine, Ser: 0.6 mg/dL — ABNORMAL LOW (ref 0.61–1.24)
GFR calc Af Amer: >60 mL/min (ref >60)
Glucose, Bld: 92 mg/dL (ref 70–99)
POTASSIUM: 3.4 mmol/L — AB (ref 3.5–5.1)
SODIUM: 136 mmol/L (ref 135–145)

## 2018-02-21 LAB — GLUCOSE, CAPILLARY
Glucose-Capillary: 81 mg/dL (ref 70–99)
Glucose-Capillary: 98 mg/dL (ref 70–99)
Glucose-Capillary: 92 mg/dL (ref 70–99)
Glucose-Capillary: 96 mg/dL (ref 70–99)

## 2018-02-21 MED ORDER — POTASSIUM CHLORIDE CRYS ER 20 MEQ PO TBCR
20.0000 meq | EXTENDED_RELEASE_TABLET | Freq: Once | ORAL | Status: AC
  Administered 2018-02-22: 20 meq via ORAL
  Filled 2018-02-21: qty 1

## 2018-02-21 NOTE — Progress Notes (Signed)
[facility] [place] NAME: Jeremiah [name]    MR#:  [id number]  DATE OF BIRTH:  Jul 26, 1940  SUBJECTIVE:   Patient came in with increasing weakness and pain over the left knee. Synovial fluid was removed patient feels little better. Unable to get around home. REVIEW OF SYSTEMS:   Review of Systems  Constitutional: Negative for chills, fever and weight loss.  HENT: Negative for ear discharge, ear pain and nosebleeds.   Eyes: Negative for blurred vision, pain and discharge.  Respiratory: Negative for sputum production, shortness of breath, wheezing and stridor.   Cardiovascular: Negative for chest pain, palpitations, orthopnea and PND.  Gastrointestinal: Negative for abdominal pain, diarrhea, nausea and vomiting.  Genitourinary: Negative for frequency and urgency.  Musculoskeletal: Positive for joint pain. Negative for back pain.  Neurological: Negative for sensory change, speech change, focal weakness and weakness.  Psychiatric/Behavioral: Negative for depression and hallucinations. The patient is not nervous/anxious.    Tolerating Diet:yes Tolerating PT: rehab  DRUG ALLERGIES:   Allergies  Allergen Reactions  . Cefoperazone Anaphylaxis  . Penicillins Anaphylaxis and Other (See Comments)    Has patient had a PCN reaction causing immediate rash, facial/tongue/throat swelling, SOB or lightheadedness with hypotension: Yes Has patient had a PCN reaction causing severe rash involving mucus membranes or skin necrosis: No Has patient had a PCN reaction that required hospitalization No Has patient had a PCN reaction occurring within the last 10 years: No If all of the above answers are "NO", then may proceed with Cephalosporin use.  . Latex Rash    VITALS:  Blood pressure 116/74, pulse 87, temperature 98.4 F (36.9 C), temperature source Oral, resp. rate 18, height 6' (1.829 m), weight 108.9 kg, SpO2 95 %.  PHYSICAL EXAMINATION:    Physical Exam  GENERAL:  77 y.o.-year-old patient lying in the bed with no acute distress.  EYES: Pupils equal, round, reactive to light and accommodation. No scleral icterus. Extraocular muscles intact.  HEENT: Head atraumatic, normocephalic. Oropharynx and nasopharynx clear.  NECK:  Supple, no jugular venous distention. No thyroid enlargement, no tenderness.  LUNGS: Normal breath sounds bilaterally, no wheezing, rales, rhonchi. No use of accessory muscles of respiration.  CARDIOVASCULAR: S1, S2 normal. No murmurs, rubs, or gallops.  ABDOMEN: Soft, nontender, nondistended. Bowel sounds present. No organomegaly or mass.  EXTREMITIES: bilateral lower extremity lymphedema. No erythema or cellulitis over the left knee NEUROLOGIC: Cranial nerves II through XII are intact. No focal Motor or sensory deficits b/l.   PSYCHIATRIC:  patient is alert and oriented x 3.  SKIN: No obvious rash, lesion, or ulcer.   LABORATORY PANEL:  CBC Recent Labs  Lab 02/21/18 0634  WBC 5.1  HGB 11.1*  HCT 32.0*  PLT 205    Chemistries  Recent Labs  Lab 02/20/18 1249 02/21/18 0634  NA 137 136  K 3.4* 3.4*  CL 100 101  CO2 31 28  GLUCOSE 112* 92  BUN 14 12  CREATININE 0.92 0.60*  CALCIUM 9.1 8.2*  AST 18  --   ALT 13  --   ALKPHOS 77  --   BILITOT 0.8  --    Cardiac Enzymes Recent Labs  Lab 02/20/18 1249  TROPONINI <0.03   RADIOLOGY:  Dg Chest 2 View  Result Date: 02/20/2018 CLINICAL DATA:  Lower extremity edema.  Hypertension. EXAM: CHEST - 2 VIEW COMPARISON:  May 23, 2015 FINDINGS: There is no edema or consolidation. The heart size and pulmonary vascularity  are normal. No adenopathy. There is degenerative change in each shoulder with postoperative change in the right humeral head region. 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 IMPRESSION: No edema or consolidation. Stable cardiac silhouette. Question avascular necrosis in visualized right  humeral head. Extensive arthropathy in both shoulders noted. Electronically Signed   By: Lowella Grip III M.D.   On: 02/20/2018 13:43   Dg Knee Complete 4 Views Left  Result Date: 02/20/2018 CLINICAL DATA:  Left knee pain, swelling and redness. Multiple recent falls. EXAM: LEFT KNEE - COMPLETE 4+ VIEW COMPARISON:  None. FINDINGS: Moderate to marked lateral joint space narrowing and mild to moderate spur formation. Mild medial and patellofemoral spur formation. Posterior patellar and suprapatellar loose bodies. No effusion. Medial and lateral meniscal calcifications. Mild lateral subluxation of the tibia relative to the distal femur. No fracture or dislocation. IMPRESSION: 1. Degenerative changes, as described above. 2. Chondrocalcinosis. 3. Loose bodies. Electronically Signed   By: Claudie Revering M.D.   On: 02/20/2018 18:11   ASSESSMENT AND PLAN:  Jeremiah [name]  is a 77 y.o. male who presents with chief complaint as above.  Patient presents to the emergency department with history of progressively increasing left knee pain.  Patient has bilateral lower extremity lymphedema, but he stated his left knee started swelling more over the past several days.  Today it was acutely worse and he was unable to stand up on it and get around his house to perform his activities of daily living  * Left knee pain -naproxen 500 mg twice daily with meals as treatment for pseudogout, synovial fluid cultures so far negative in <24 hours - cover with vancomycin for now and if cultures remain negative DC antibiotic.   *  Benign essential hypertension -continue home medications  *  Rheumatoid arthritis ([place]) -continue home dose meds  *  Diabetes ([facility]) -sliding scale insulin with corresponding glucose checks  *  Gastroesophageal reflux disease -Home dose PPI  *  Hyperlipidemia -Home dose antilipid  Patient has significant bilateral lymphedema. He has been having issues with mobility carrying out his ADLs and  progressively declining over the past one month. His wife has medical problems not able to help. His son also has issues with back pain and arthritis and not able to help patient around. 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   Case discussed with Care Management/Social Worker. Management plans discussed with the patient, family and they are in agreement.  CODE STATUS: full  DVT Prophylaxis: lovenox  TOTAL TIME TAKING CARE OF THIS PATIENT: *30 minutes.  >50% time spent on counselling and coordination of care  POSSIBLE D/C IN *1-2 DAYS, DEPENDING ON CLINICAL CONDITION.  Note: This dictation was prepared with Dragon dictation along with smaller phrase technology. Any transcriptional errors that result from this process are unintentional.  [name] M.D on 02/21/2018 at 3:09 PM  Between 7am to 6pm - Pager - [phone]  After 6pm go to [email or web address] - password [email or web address] [facility]  Office  [phone]  CC: Primary care physician; Derinda Late, MDPatient ID: [name]., male   DOB: 06-19-1940, 77 y.o.   MRN: [id number]

## 2018-02-21 NOTE — Progress Notes (Signed)
Clinical Social Worker (CSW) presented bed offers to patient and his wife [name]. They chose [facility]. Per wife she will bring patient's injections etanercept to [facility] from home. Per [facility] admissions coordinator at [facility] she will start [facility] SNF authorization today.   [employer], LCSW [phone]

## 2018-02-21 NOTE — Clinical Social Work Placement (Signed)
   CLINICAL SOCIAL WORK PLACEMENT  NOTE  Date:  02/21/2018  Patient Details  Name: Jeremiah [name]. MRN: [id number] Date of Birth: 1941/03/24  Clinical Social Work is seeking post-discharge placement for this patient at the [place] level of care (*CSW will initial, date and re-position this form in  chart as items are completed):  Yes   Patient/family provided with [facility] Work Department's list of facilities offering this level of care within the geographic area requested by the patient (or if unable, by the patient's family).  Yes   Patient/family informed of their freedom to choose among providers that offer the needed level of care, that participate in Medicare, Medicaid or managed care program needed by the patient, have an available bed and are willing to accept the patient.  Yes   Patient/family informed of [facility]'s ownership interest in [facility] and [facility], as well as of the fact that they are under no obligation to receive care at these facilities.  PASRR submitted to EDS on       PASRR number received on       Existing PASRR number confirmed on 02/20/18     FL2 transmitted to all facilities in geographic area requested by pt/family on 02/20/18     FL2 transmitted to all facilities within larger geographic area on       Patient informed that his/her managed care company has contracts with or will negotiate with certain facilities, including the following:        Yes   Patient/family informed of bed offers received.  Patient chooses bed at [facility] )     Physician recommends and patient chooses bed at      Patient to be transferred to   on  .  Patient to be transferred to facility by       Patient family notified on   of transfer.  Name of family member notified:        PHYSICIAN Please sign FL2     Additional Comment:    _______________________________________________ Haron Beilke, Veronia Beets,  LCSW 02/21/2018, 2:15 PM

## 2018-02-21 NOTE — Plan of Care (Signed)
  Problem: Health Behavior/Discharge Planning: Goal: Ability to manage health-related needs will improve Outcome: Progressing   Problem: Clinical Measurements: Goal: Will remain free from infection Outcome: Progressing   Problem: Activity: Goal: Risk for activity intolerance will decrease Outcome: Progressing   Problem: Elimination: Goal: Will not experience complications related to bowel motility Outcome: Progressing Goal: Will not experience complications related to urinary retention Outcome: Progressing   Problem: Pain Managment: Goal: General experience of comfort will improve Outcome: Progressing   Problem: Safety: Goal: Ability to remain free from injury will improve Outcome: Progressing   Problem: Skin Integrity: Goal: Risk for impaired skin integrity will decrease Outcome: Progressing   

## 2018-02-21 NOTE — Progress Notes (Signed)
Subjective: He reports pain is improved partially. He was able to sleep relatively well last night.    Objective: Vital signs in last 24 hours: Temp:  [98 F (36.7 C)-98.7 F (37.1 C)] 98 F (36.7 C) (10/04 0335) Pulse Rate:  [85-106] 95 (10/04 0335) Resp:  [16-18] 18 (10/04 0335) BP: (107-129)/(59-91) 107/67 (10/04 0335) SpO2:  [97 %-100 %] 97 % (10/04 0335) Weight:  [108.9 kg] 108.9 kg (10/03 1215)  Intake/Output from previous day: 10/03 0701 - 10/04 0700 In: 513.4 [I.V.:513.4] Out: -  Intake/Output this shift: No intake/output data recorded.  Recent Labs    02/20/18 1249 02/21/18 0634  HGB 11.8* 11.1*   Recent Labs    02/20/18 1249 02/21/18 0634  WBC 5.3 5.1  RBC 3.86* 3.67*  HCT 33.9* 32.0*  PLT 230 205   Recent Labs    02/20/18 1249 02/21/18 0634  NA 137 136  K 3.4* 3.4*  CL 100 101  CO2 31 28  BUN 14 12  CREATININE 0.92 0.60*  GLUCOSE 112* 92  CALCIUM 9.1 8.2*   No results for input(s): LABPT, INR in the last 72 hours.  L Knee joint Aspirate (from ED): Component     Latest Ref Rng & Units 02/20/2018  Color, Synovial     YELLOW YELLOW (A)  Appearance-Synovial     CLEAR CLOUDY (A)  Crystals, Fluid      NO CRYSTALS SEEN  WBC, Synovial     0 - 200 /cu mm 24,250 (H)  Neutrophil, Synovial     % 92  Lymphocytes-Synovial Fld     % 0  Monocyte-Macrophage-Synovial Fluid     % 8  Eosinophils-Synovial     % 0    Gram Stain - NO ORGANISMS SEEN  MANY WBC SEEN      Physical Exam: Gen: NAD, conversational LLE: 5/5 DF/PF/EHL SILT s/s/t/sp/dp distr Foot wwp RoM Knee: 10-90, severe pain with attempted RoM > 90 but relatively pain-free motion within this defined range.  Significant diffuse lower extremity swelling consistent with lymphedema.  Mild warmth to L knee vs contralateral knee. Minimal erythema. Effusion of L knee present.  Significantly tender to palpation diffusely although improved vs yesterday, including medial joint line,  lateral joint line, and patella.    X-rays: L knee: severe degenerative changes, especially to lateral compartment with chondrocalcinosis present.   Assessment/Plan: 77 yo M w/severe L knee pain, likely due to pseudogout. Joint aspirate cell count consistent with inflammatory process, but not necessarily infectious. Additionally, there is chondrocalcinosis on radiographs. Septic arthritis is less likely, but still possible. Patient appears to have mild improvement in symptoms today.  1. Recommend continued treatment for pseudogout with NSAIDs (can use naproxen 500mg  bid).  2. Continue empiric antibiotics until joint aspirate cultures are negative. He has severe allergies to penicillin and cephalosporins.  3. Can have regular diet. No plan for surgical intervention at this point.  4. Will re-evaluate if patient still in hospital tomorrow.  5. Can start PT/OT. May need placement to SNF.   Leim Fabry 02/21/2018, 7:32 AM

## 2018-02-22 LAB — GLUCOSE, CAPILLARY
Glucose-Capillary: 75 mg/dL (ref 70–99)
Glucose-Capillary: 91 mg/dL (ref 70–99)
Glucose-Capillary: 97 mg/dL (ref 70–99)
Glucose-Capillary: 94 mg/dL (ref 70–99)

## 2018-02-22 MED ORDER — TAMSULOSIN HCL 0.4 MG PO CAPS
0.4000 mg | ORAL_CAPSULE | Freq: Every day | ORAL | Status: DC
  Administered 2018-02-22 – 2018-02-24 (×3): 0.4 mg via ORAL
  Filled 2018-02-22 (×3): qty 1

## 2018-02-22 MED ORDER — SENNOSIDES-DOCUSATE SODIUM 8.6-50 MG PO TABS
1.0000 | ORAL_TABLET | Freq: Two times a day (BID) | ORAL | Status: DC
  Administered 2018-02-22 – 2018-02-24 (×4): 1 via ORAL
  Filled 2018-02-22 (×4): qty 1

## 2018-02-22 MED ORDER — BISACODYL 10 MG RE SUPP
10.0000 mg | Freq: Every day | RECTAL | Status: DC | PRN
  Administered 2018-02-24: 10 mg via RECTAL
  Filled 2018-02-22: qty 1

## 2018-02-22 MED ORDER — POLYETHYLENE GLYCOL 3350 17 G PO PACK
17.0000 g | PACK | Freq: Every day | ORAL | Status: DC
  Administered 2018-02-22 – 2018-02-24 (×2): 17 g via ORAL
  Filled 2018-02-22 (×2): qty 1

## 2018-02-22 NOTE — Progress Notes (Signed)
[place] [facility] [place] NAME: Jeremiah [name]    MR#:  [id number]  DATE OF BIRTH:  1941-01-12  SUBJECTIVE:   Patient is constipated having urinary retention.  Bladder scan showed 800 cc of urine last night and patient had put out thousand mL via in and out cath  Unable to get around home. Awaiting insurance authorization REVIEW OF SYSTEMS:   Review of Systems  Constitutional: Negative for chills, fever and weight loss.  HENT: Negative for ear discharge, ear pain and nosebleeds.   Eyes: Negative for blurred vision, pain and discharge.  Respiratory: Negative for sputum production, shortness of breath, wheezing and stridor.   Cardiovascular: Negative for chest pain, palpitations, orthopnea and PND.  Gastrointestinal: Negative for abdominal pain, diarrhea, nausea and vomiting.  Genitourinary: Negative for frequency and urgency.  Musculoskeletal: Positive for joint pain. Negative for back pain.  Neurological: Negative for sensory change, speech change, focal weakness and weakness.  Psychiatric/Behavioral: Negative for depression and hallucinations. The patient is not nervous/anxious.    Tolerating Diet:yes Tolerating PT: rehab  DRUG ALLERGIES:   Allergies  Allergen Reactions  . Cefoperazone Anaphylaxis  . Penicillins Anaphylaxis and Other (See Comments)    Has patient had a PCN reaction causing immediate rash, facial/tongue/throat swelling, SOB or lightheadedness with hypotension: Yes Has patient had a PCN reaction causing severe rash involving mucus membranes or skin necrosis: No Has patient had a PCN reaction that required hospitalization No Has patient had a PCN reaction occurring within the last 10 years: No If all of the above answers are "NO", then may proceed with Cephalosporin use.  . Latex Rash    VITALS:  Blood pressure 118/72, pulse 84, temperature 98 F (36.7 C), temperature source Oral, resp. rate 18, height 6' (1.829 m),  weight 108.9 kg, SpO2 96 %.  PHYSICAL EXAMINATION:   Physical Exam  GENERAL:  77 y.o.-year-old patient lying in the bed with no acute distress.  EYES: Pupils equal, round, reactive to light and accommodation. No scleral icterus. Extraocular muscles intact.  HEENT: Head atraumatic, normocephalic. Oropharynx and nasopharynx clear.  NECK:  Supple, no jugular venous distention. No thyroid enlargement, no tenderness.  LUNGS: Normal breath sounds bilaterally, no wheezing, rales, rhonchi. No use of accessory muscles of respiration.  CARDIOVASCULAR: S1, S2 normal. No murmurs, rubs, or gallops.  ABDOMEN: Soft, nontender, nondistended. Bowel sounds present. No organomegaly or mass.  EXTREMITIES: bilateral lower extremity lymphedema. No erythema or cellulitis over the left knee NEUROLOGIC: Cranial nerves II through XII are intact. No focal Motor or sensory deficits b/l.   PSYCHIATRIC:  patient is alert and oriented x 3.  SKIN: No obvious rash, lesion, or ulcer.   LABORATORY PANEL:  CBC Recent Labs  Lab 02/21/18 0634  WBC 5.1  HGB 11.1*  HCT 32.0*  PLT 205    Chemistries  Recent Labs  Lab 02/20/18 1249 02/21/18 0634  NA 137 136  K 3.4* 3.4*  CL 100 101  CO2 31 28  GLUCOSE 112* 92  BUN 14 12  CREATININE 0.92 0.60*  CALCIUM 9.1 8.2*  AST 18  --   ALT 13  --   ALKPHOS 77  --   BILITOT 0.8  --    Cardiac Enzymes Recent Labs  Lab 02/20/18 1249  TROPONINI <0.03   RADIOLOGY:  Dg Chest 2 View  Result Date: 02/20/2018 CLINICAL DATA:  Lower extremity edema.  Hypertension. EXAM: CHEST - 2 VIEW COMPARISON:  May 23, 2015 FINDINGS:  There is no edema or consolidation. The heart size and pulmonary vascularity are normal. No adenopathy. There is degenerative change in each shoulder with postoperative change in the right humeral head region. 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 IMPRESSION: No edema or consolidation. Stable cardiac silhouette.  Question avascular necrosis in visualized right humeral head. Extensive arthropathy in both shoulders noted. Electronically Signed   By: Lowella Grip III M.D.   On: 02/20/2018 13:43   Dg Knee Complete 4 Views Left  Result Date: 02/20/2018 CLINICAL DATA:  Left knee pain, swelling and redness. Multiple recent falls. EXAM: LEFT KNEE - COMPLETE 4+ VIEW COMPARISON:  None. FINDINGS: Moderate to marked lateral joint space narrowing and mild to moderate spur formation. Mild medial and patellofemoral spur formation. Posterior patellar and suprapatellar loose bodies. No effusion. Medial and lateral meniscal calcifications. Mild lateral subluxation of the tibia relative to the distal femur. No fracture or dislocation. IMPRESSION: 1. Degenerative changes, as described above. 2. Chondrocalcinosis. 3. Loose bodies. Electronically Signed   By: Claudie Revering M.D.   On: 02/20/2018 18:11   ASSESSMENT AND PLAN:  Jeremiah [name]  is a 77 y.o. male who presents with chief complaint as above.  Patient presents to the emergency department with history of progressively increasing left knee pain.  Patient has bilateral lower extremity lymphedema, but he stated his left knee started swelling more over the past several days.  Today it was acutely worse and he was unable to stand up on it and get around his house to perform his activities of daily living  *Acute left knee pain -secondary to pseudogout ; status post joint aspiration naproxen 500 mg twice daily with meals as treatment for pseudogout, synovial fluid cultures -negative Blood cultures are negative for 5 days -We will discontinue vancomycin if okay with orthopedics  *Acute urinary retention Flomax started Foley catheter and outpatient follow-up with urology in 5 to 7 days after discharge discussed with Dr. Junious Silk curbside, agreeable with the plan  *Constipation Senokot, MiraLAX, Dulcolax suppository and soapsuds enema as needed  *  Benign essential  hypertension -continue home medications  *  Rheumatoid arthritis ([place]) -continue home dose meds  *  Diabetes ([place]) -sliding scale insulin with corresponding glucose checks  *  Gastroesophageal reflux disease -Home dose PPI  *  Hyperlipidemia -Home dose antilipid  Patient has significant bilateral lymphedema. He has been having issues with mobility carrying out his ADLs and progressively declining over the past one month. His wife has medical problems not able to help. His son also has issues with back pain and arthritis and not able to help patient around. 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  Disposition skilled nursing facility awaiting insurance authorization  Case discussed with Care Management/Social Worker. Management plans discussed with the patient, family and they are in agreement.  CODE STATUS: full  DVT Prophylaxis: lovenox  TOTAL TIME TAKING CARE OF THIS PATIENT: 33  minutes.  >50% time spent on counselling and coordination of care  POSSIBLE D/C IN *1-2 DAYS, DEPENDING ON CLINICAL CONDITION.  Note: This dictation was prepared with Dragon dictation along with smaller phrase technology. Any transcriptional errors that result from this process are unintentional.  Nicholes Mango M.D on 02/22/2018 at 12:59 PM  Between 7am to 6pm - Pager - [phone]  After 6pm go to [email or web address] - [place]  [facility]  Office  [phone]  CC: Primary care physician; Derinda Late, MDPatient ID: Jeremiah Royals., male   DOB: 04/15/1941, 77 y.o.   MRN: [id number]

## 2018-02-22 NOTE — Plan of Care (Signed)
  Problem: Health Behavior/Discharge Planning: Goal: Ability to manage health-related needs will improve Outcome: Progressing   Problem: Clinical Measurements: Goal: Ability to maintain clinical measurements within normal limits will improve Outcome: Progressing Goal: Will remain free from infection Outcome: Progressing   Problem: Activity: Goal: Risk for activity intolerance will decrease Outcome: Progressing   Problem: Elimination: Goal: Will not experience complications related to urinary retention Outcome: Progressing   Problem: Pain Managment: Goal: General experience of comfort will improve Outcome: Progressing   Problem: Skin Integrity: Goal: Risk for impaired skin integrity will decrease Outcome: Progressing

## 2018-02-22 NOTE — Progress Notes (Signed)
CC: L knee pain  Subjective: Pain continues to improve.   Objective: Vital signs in last 24 hours: Temp:  [98 F (36.7 C)-98.2 F (36.8 C)] 98 F (36.7 C) (10/05 0753) Pulse Rate:  [84-89] 84 (10/05 0753) Resp:  [18-20] 18 (10/05 0753) BP: (109-118)/(69-81) 118/72 (10/05 0753) SpO2:  [95 %-96 %] 96 % (10/05 0753)  Intake/Output from previous day: 10/04 0701 - 10/05 0700 In: 791.1 [P.O.:480; I.V.:311.1] Out: 2675 [Urine:2675] Intake/Output this shift: No intake/output data recorded.  Recent Labs    02/20/18 1249 02/21/18 0634  HGB 11.8* 11.1*   Recent Labs    02/20/18 1249 02/21/18 0634  WBC 5.3 5.1  RBC 3.86* 3.67*  HCT 33.9* 32.0*  PLT 230 205   Recent Labs    02/20/18 1249 02/21/18 0634  NA 137 136  K 3.4* 3.4*  CL 100 101  CO2 31 28  BUN 14 12  CREATININE 0.92 0.60*  GLUCOSE 112* 92  CALCIUM 9.1 8.2*   No results for input(s): LABPT, INR in the last 72 hours.  Knee joint aspiration culture: No growth to date  Physical Exam: LLE: 5/5 DF/PF/EHL SILT s/s/t/sp/dp distr Foot wwp RoMKnee: 10-90, severe pain with attempted RoM >90 but relatively pain-free motion within this defined range.  Significant diffuse lower extremity swelling consistent with lymphedema.  No increased warmth to L knee vs contralateral knee. Minimal erythema. Effusion of L knee present.  Only mild tenderness to palpation over medial joint line, lateral joint line, and patella.   X-rays: L knee: severe degenerative changes, especially to lateral compartment with chondrocalcinosis present.   Assessment/Plan: 77 yo M w/severe L knee pain, likely due to pseudogout. Joint aspirate cell count consistent with inflammatory process, but not necessarily infectious. Additionally, there is chondrocalcinosis on radiographs. Septic arthritis is less likely, but still possible. Patient's knee pain continues to improve.  1. Recommend continued treatment for pseudogout with NSAIDs (can  use naproxen 500mg  bid).  2. Cx showing NGTD. Continue Abx until confirmed negative. 3. Can have regular diet. No plan for surgical intervention at this point.  4. Continue PT/OT, may need SNF placement. 5. Will follow peripherally. Please page if any questions or concerns. He can follow up with his PCP or with Dr. Marry Guan as initially planned for consideration of TKA.    Leim Fabry 02/22/2018, 8:32 AM

## 2018-02-22 NOTE — Progress Notes (Signed)
Physical Therapy Treatment Patient Details Name: Jeremiah [name]. MRN: [id number] DOB: 09/08/1940 Today's Date: 02/22/2018    History of Present Illness presented to ER secondary to progressive LE pain, weakness and inability to ambulate, care for self in home environment.    PT Comments    Participated in exercises as described below.  Pt allowed for improved L LE activity and ROM this session.  To edge of bed with rail but no physical assist.  He was able to sit EOB x 5 minutes without assist and no LOB. He wanted to try standing but was unable despite +2 assist and elevated bed.  He was able to return to supine without assist and reposition in bed with verbal cues and head tipped down.  Tolerated 90 degrees flexion L knee in sitting.    Pt with overall improvement with non-weight bearing tasks.  He remains unable to stand at this time but remains motivated to increase functional independence.  He may do well with sliding board or lateral scoot transfer to a drop arm recliner to allow for out of bed activities.  Will attempt next session.    Follow Up Recommendations  SNF     Equipment Recommendations       Recommendations for Other Services       Precautions / Restrictions Precautions Precautions: Fall Restrictions Weight Bearing Restrictions: No    Mobility  Bed Mobility Overal bed mobility: Needs Assistance Bed Mobility: Rolling;Supine to Sit;Sit to Supine Rolling: Min assist   Supine to sit: Min guard Sit to supine: Min guard   General bed mobility comments: generally no physical assist needed but some verbal cues for hand placements.    Transfers Overall transfer level: Needs assistance Equipment used: Rolling walker (2 wheeled) Transfers: Sit to/from Stand Sit to Stand: Total assist;From elevated surface         General transfer comment: unable to stand despite +2 assist due to pain  Ambulation/Gait             General Gait Details: unable to  tolerate due to pain in L > R LE   Stairs             Wheelchair Mobility    Modified Rankin (Stroke Patients Only)       Balance Overall balance assessment: Needs assistance Sitting-balance support: Feet supported Sitting balance-Leahy Scale: Good     Standing balance support: Bilateral upper extremity supported Standing balance-Leahy Scale: Zero                              Cognition Arousal/Alertness: Awake/alert Behavior During Therapy: WFL for tasks assessed/performed Overall Cognitive Status: Within Functional Limits for tasks assessed                                        Exercises Other Exercises Other Exercises: BLE AROM RLE, AAROM LLE for ankle pumps, heel slides, ab/add and SLR 2 x 10 Other Exercises: Sat EOB x 5 minutes unsupported without difficulty or LOB    General Comments        Pertinent Vitals/Pain Pain Assessment: 0-10 Pain Score: 7  Pain Location: 7/10 at rest, 4/10 after activity L knee Pain Descriptors / Indicators: Aching;Grimacing;Guarding Pain Intervention(s): Limited activity within patient's tolerance;Monitored during session;Repositioned    Home Living  Prior Function            PT Goals (current goals can now be found in the care plan section) Progress towards PT goals: Progressing toward goals    Frequency    Min 2X/week      PT Plan Current plan remains appropriate    Co-evaluation              AM-PAC PT "6 Clicks" Daily Activity  Outcome Measure  Difficulty turning over in bed (including adjusting bedclothes, sheets and blankets)?: A Little Difficulty moving from lying on back to sitting on the side of the bed? : None Difficulty sitting down on and standing up from a chair with arms (e.g., wheelchair, bedside commode, etc,.)?: Unable Help needed moving to and from a bed to chair (including a wheelchair)?: Total Help needed walking in hospital  room?: Total Help needed climbing 3-5 steps with a railing? : Total 6 Click Score: 11    End of Session Equipment Utilized During Treatment: Gait belt Activity Tolerance: Patient tolerated treatment well;Patient limited by pain Patient left: in bed;with call bell/phone within reach Nurse Communication: Mobility status Pain - Right/Left: Left Pain - part of body: Leg     Time: 1388-7195 PT Time Calculation (min) (ACUTE ONLY): 29 min  Charges:  $Therapeutic Exercise: 8-22 mins $Therapeutic Activity: 8-22 mins                     Chesley Noon, PTA 02/22/18, 10:37 AM

## 2018-02-23 LAB — BODY FLUID CULTURE: CULTURE: NO GROWTH

## 2018-02-23 LAB — GLUCOSE, CAPILLARY
Glucose-Capillary: 84 mg/dL (ref 70–99)
Glucose-Capillary: 90 mg/dL (ref 70–99)
Glucose-Capillary: 83 mg/dL (ref 70–99)
Glucose-Capillary: 96 mg/dL (ref 70–99)

## 2018-02-23 NOTE — Plan of Care (Signed)
  Problem: Activity: Goal: Risk for activity intolerance will decrease Outcome: Progressing   Problem: Elimination: Goal: Will not experience complications related to urinary retention Outcome: Progressing   Problem: Pain Managment: Goal: General experience of comfort will improve Outcome: Progressing   Problem: Skin Integrity: Goal: Risk for impaired skin integrity will decrease Outcome: Progressing

## 2018-02-23 NOTE — Progress Notes (Signed)
[place] [facility] [place] NAME: Janssen Zee    MR#:  [id number]  DATE OF BIRTH:  08-16-1940  SUBJECTIVE:   Patient had a bowel movement.  Foley catheter placed for urinary retention  Awaiting insurance authorization REVIEW OF SYSTEMS:   Review of Systems  Constitutional: Negative for chills, fever and weight loss.  HENT: Negative for ear discharge, ear pain and nosebleeds.   Eyes: Negative for blurred vision, pain and discharge.  Respiratory: Negative for sputum production, shortness of breath, wheezing and stridor.   Cardiovascular: Negative for chest pain, palpitations, orthopnea and PND.  Gastrointestinal: Negative for abdominal pain, diarrhea, nausea and vomiting.  Genitourinary: Negative for frequency and urgency.  Musculoskeletal: Positive for joint pain. Negative for back pain.  Neurological: Negative for sensory change, speech change, focal weakness and weakness.  Psychiatric/Behavioral: Negative for depression and hallucinations. The patient is not nervous/anxious.    Tolerating Diet:yes Tolerating PT: rehab  DRUG ALLERGIES:   Allergies  Allergen Reactions  . Cefoperazone Anaphylaxis  . Penicillins Anaphylaxis and Other (See Comments)    Has patient had a PCN reaction causing immediate rash, facial/tongue/throat swelling, SOB or lightheadedness with hypotension: Yes Has patient had a PCN reaction causing severe rash involving mucus membranes or skin necrosis: No Has patient had a PCN reaction that required hospitalization No Has patient had a PCN reaction occurring within the last 10 years: No If all of the above answers are "NO", then may proceed with Cephalosporin use.  . Latex Rash    VITALS:  Blood pressure 109/71, pulse 73, temperature 98.4 F (36.9 C), resp. rate 20, height 6' (1.829 m), weight 108.9 kg, SpO2 97 %.  PHYSICAL EXAMINATION:   Physical Exam  GENERAL:  77 y.o.-year-old patient lying in the bed  with no acute distress.  EYES: Pupils equal, round, reactive to light and accommodation. No scleral icterus. Extraocular muscles intact.  HEENT: Head atraumatic, normocephalic. Oropharynx and nasopharynx clear.  NECK:  Supple, no jugular venous distention. No thyroid enlargement, no tenderness.  LUNGS: Normal breath sounds bilaterally, no wheezing, rales, rhonchi. No use of accessory muscles of respiration.  CARDIOVASCULAR: S1, S2 normal. No murmurs, rubs, or gallops.  ABDOMEN: Soft, nontender, nondistended. Bowel sounds present. No organomegaly or mass.  EXTREMITIES: bilateral lower extremity lymphedema. No erythema or cellulitis over the left knee NEUROLOGIC: Cranial nerves II through XII are intact. No focal Motor or sensory deficits b/l.   PSYCHIATRIC:  patient is alert and oriented x 3.  SKIN: No obvious rash, lesion, or ulcer.   LABORATORY PANEL:  CBC Recent Labs  Lab 02/21/18 0634  WBC 5.1  HGB 11.1*  HCT 32.0*  PLT 205    Chemistries  Recent Labs  Lab 02/20/18 1249 02/21/18 0634  NA 137 136  K 3.4* 3.4*  CL 100 101  CO2 31 28  GLUCOSE 112* 92  BUN 14 12  CREATININE 0.92 0.60*  CALCIUM 9.1 8.2*  AST 18  --   ALT 13  --   ALKPHOS 77  --   BILITOT 0.8  --    Cardiac Enzymes Recent Labs  Lab 02/20/18 1249  TROPONINI <0.03   RADIOLOGY:  No results found. ASSESSMENT AND PLAN:  Fayez Sturgell  is a 77 y.o. male who presents with chief complaint as above.  Patient presents to the emergency department with history of progressively increasing left knee pain.  Patient has bilateral lower extremity lymphedema, but he stated his left knee started  swelling more over the past several days.  Today it was acutely worse and he was unable to stand up on it and get around his house to perform his activities of daily living  *Acute left knee pain -secondary to pseudogout ; status post joint aspiration naproxen 500 mg twice daily with meals as treatment for pseudogout, synovial  fluid cultures -negative Blood cultures are negative for 5 days -We will discontinue vancomycin if okay with orthopedics  *Acute urinary retention Improving with Flomax  Foley catheter and outpatient follow-up with urology in 5 to 7 days after discharge discussed with Dr. Junious Silk curbside, agreeable with the plan  *Constipation Senokot, MiraLAX, Dulcolax suppository  as needed  *  Benign essential hypertension -continue home medications  *  Rheumatoid arthritis ([place]) -continue home dose meds  *  Diabetes ([place]) -sliding scale insulin with corresponding glucose checks  *  Gastroesophageal reflux disease -Home dose PPI  *  Hyperlipidemia -Home dose antilipid  Patient has significant bilateral lymphedema. He has been having issues with mobility carrying out his ADLs and progressively declining over the past one month. His wife has medical problems not able to help. His son also has issues with back pain and arthritis and not able to help patient around. 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  Disposition skilled nursing facility awaiting insurance authorization  Case discussed with Care Management/Social Worker. Management plans discussed with the patient, family and they are in agreement.  CODE STATUS: full  DVT Prophylaxis: lovenox  TOTAL TIME TAKING CARE OF THIS PATIENT: 33  minutes.  >50% time spent on counselling and coordination of care  POSSIBLE D/C IN *1-2 DAYS, DEPENDING ON CLINICAL CONDITION.  Note: This dictation was prepared with Dragon dictation along with smaller phrase technology. Any transcriptional errors that result from this process are unintentional.  Nicholes Mango M.D on 02/23/2018 at 11:15 AM  Between 7am to 6pm - Pager - [phone]  After 6pm go to [email or web address] - password [email or web address] [facility]  Office  [phone]  CC: Primary care physician; Derinda Late, MDPatient ID: [name]., male   DOB: [birth year], 77 y.o.   MRN: [id number]

## 2018-02-24 LAB — GLUCOSE, CAPILLARY
Glucose-Capillary: 79 mg/dL (ref 70–99)
Glucose-Capillary: 71 mg/dL (ref 70–99)
Glucose-Capillary: 95 mg/dL (ref 70–99)

## 2018-02-24 MED ORDER — SODIUM CHLORIDE 0.9 % IV SOLN: INTRAVENOUS | Status: DC

## 2018-02-24 MED ORDER — INSULIN ASPART 100 UNIT/ML ~~LOC~~ SOLN: 0.0000 [IU] | Freq: Every day | SUBCUTANEOUS | 11 refills | Status: DC

## 2018-02-24 MED ORDER — NAPROXEN 500 MG PO TABS: 500.0000 mg | ORAL_TABLET | Freq: Two times a day (BID) | ORAL | Status: AC

## 2018-02-24 MED ORDER — ACETAMINOPHEN 325 MG PO TABS: 650.0000 mg | ORAL_TABLET | Freq: Four times a day (QID) | ORAL | Status: AC | PRN

## 2018-02-24 MED ORDER — POLYETHYLENE GLYCOL 3350 17 G PO PACK: 17.0000 g | PACK | Freq: Every day | ORAL | 0 refills | Status: DC

## 2018-02-24 MED ORDER — INSULIN ASPART 100 UNIT/ML ~~LOC~~ SOLN: 0.0000 [IU] | Freq: Three times a day (TID) | SUBCUTANEOUS | 11 refills | Status: DC

## 2018-02-24 MED ORDER — ETANERCEPT 50 MG/ML ~~LOC~~ SOSY
50.0000 mg | PREFILLED_SYRINGE | Freq: Once | SUBCUTANEOUS | Status: DC
  Filled 2018-02-24: qty 0.98

## 2018-02-24 MED ORDER — TAMSULOSIN HCL 0.4 MG PO CAPS: 0.4000 mg | ORAL_CAPSULE | Freq: Every day | ORAL | Status: DC

## 2018-02-24 MED ORDER — SODIUM CHLORIDE 0.9 % IV BOLUS: 1000.0000 mL | Freq: Once | INTRAVENOUS | Status: DC

## 2018-02-24 MED ORDER — OXYCODONE HCL 5 MG PO TABS: 5.0000 mg | ORAL_TABLET | Freq: Four times a day (QID) | ORAL | 0 refills | Status: DC | PRN

## 2018-02-24 NOTE — Progress Notes (Addendum)
Pt ready for discharge to SNF per MD. Pt assessment unchanged from this morning. Report called to [name] at [facility]; all questions answered and discharge instructions reviewed. Pt discharging with foleyLevada Dy aware appt with urologist needs to be made as our secretary could not get in touch with office. EMS transportation set up for pt by RN. PIV removed, VSS. Pt belongings packed. Wife at bedside.  Jeremiah [name]

## 2018-02-24 NOTE — Progress Notes (Addendum)
Per [facility] admissions coordinator at [facility] SNF authorization is still pending. Patient is aware of above.   [employer], LCSW [phone]

## 2018-02-24 NOTE — Care Management Note (Signed)
Case Management Note  Patient Details  Name: Jeremiah [name]. MRN: [id number] Date of Birth: 06-Dec-1940  Subjective/Objective:  Admitted to [facility] for left knee surgery                  Action/Plan: Physical therapy evaluation completed. Recommending skilled nursing facility.  Clinical Social Worker did bed search. Will be discharged to [facility] today, if insurance authorization is obtained   Expected Discharge Date:  02/24/18               Expected Discharge Plan:     In-House Referral:   yes  Discharge planning Services   yes  Post Acute Care Choice:    Choice offered to:     DME Arranged:    DME Agency:     HH Arranged:    Many Agency:     Status of Service:     If discussed at [employer], dates discussed:    Additional Comments:  Shelbie Ammons, RN MSN CCM Care Management [phone] 02/24/2018, 10:08 AM

## 2018-02-24 NOTE — Progress Notes (Signed)
Patient is medically stable for D/C to [facility] today. Per [facility] admissions coordinator at [facility] SNF authorization has been received and patient can come today to room 501. RN will call report and arrange EMS for transport. Clinical Education officer, museum (CSW) sent D/C orders to [facility] via [place]. Patient is aware of above. Patient's wife [name] is at bedside and aware of above. Please reconsult if future social work needs arise. CSW signing off.   [employer], LCSW [phone]

## 2018-02-24 NOTE — Progress Notes (Signed)
PT Cancellation Note  Patient Details Name: Jeremiah [name]. MRN: [id number] DOB: 15-Jan-1941   Cancelled Treatment:    Reason Eval/Treat Not Completed: Patient declined PT services this date secondary to preparing for discharge to SNF.  Will attempt to see pt at a future date/time if discharge is delayed.     Linus Salmons PT, DPT 02/24/18, 3:15 PM

## 2018-02-24 NOTE — Clinical Social Work Placement (Signed)
   CLINICAL SOCIAL WORK PLACEMENT  NOTE  Date:  02/24/2018  Patient Details  Name: Jeremiah [name]. MRN: [id number] Date of Birth: 08-03-1940  Clinical Social Work is seeking post-discharge placement for this patient at the [facility] level of care (*CSW will initial, date and re-position this form in  chart as items are completed):  Yes   Patient/family provided with [place] Work Department's list of facilities offering this level of care within the geographic area requested by the patient (or if unable, by the patient's family).  Yes   Patient/family informed of their freedom to choose among providers that offer the needed level of care, that participate in Medicare, Medicaid or managed care program needed by the patient, have an available bed and are willing to accept the patient.  Yes   Patient/family informed of [facility]'s ownership interest in [facility] and [facility], as well as of the fact that they are under no obligation to receive care at these facilities.  PASRR submitted to EDS on       PASRR number received on       Existing PASRR number confirmed on 02/20/18     FL2 transmitted to all facilities in geographic area requested by pt/family on 02/20/18     FL2 transmitted to all facilities within larger geographic area on       Patient informed that his/her managed care company has contracts with or will negotiate with certain facilities, including the following:        Yes   Patient/family informed of bed offers received.  Patient chooses bed at [facility] )     Physician recommends and patient chooses bed at      Patient to be transferred to [facility] ) on 02/24/18.  Patient to be transferred to facility by [facility] )     Patient family notified on 02/24/18 of transfer.  Name of family member notified:  (Patient's wife Jeremiah [name] is at bedside and aware of D/C today. )     PHYSICIAN        Additional Comment:    _______________________________________________ [place] Capers, Veronia Beets, LCSW 02/24/2018, 3:30 PM

## 2018-02-24 NOTE — Discharge Instructions (Signed)
Follow-up with primary care physician at the facility in 3 days Follow-up with urology Dr. Junious Silk in 5 days Follow-up with orthopedics Dr. Leim Fabry in a week

## 2018-02-24 NOTE — Discharge Summary (Signed)
[facility] [place] NAME: Jeremiah [name]    MR#:  [id number]  DATE OF BIRTH:  1940-09-23  DATE OF ADMISSION:  02/20/2018 ADMITTING PHYSICIAN: Harrie Foreman, MD  DATE OF DISCHARGE: 02/24/18  PRIMARY CARE PHYSICIAN: Derinda Late, MD    ADMISSION DIAGNOSIS:  Arthritis [M19.90] Weakness [R53.1] Acute pain of left knee [M25.562]  DISCHARGE DIAGNOSIS:  Principal Problem:   Left knee pain Active Problems:   Benign essential hypertension   Gastroesophageal reflux disease   Hyperlipidemia   Rheumatoid arthritis ([place])   Diabetes ([place])   SECONDARY DIAGNOSIS:   Past Medical History:  Diagnosis Date  . Allergy   . Arthritis   . Hypertension   . Rheumatoid arthritis ([place])   . Seizures ([place])   . Sepsis ([place]) 05/23/2015    HOSPITAL COURSE:   Jeremiah [name]  is a 77 y.o. male who presents with chief complaint as above.  Patient presents to the emergency department with history of progressively increasing left knee pain.  Patient has bilateral lower extremity lymphedema, but he stated his left knee started swelling more over the past several days.  Today it was acutely worse and he was unable to stand up on it and get around his house to perform his activities of daily living.  Here in the ED that joint is larger when compared to the right knee, warm to touch and mildly erythematous.  Arthrocentesis was performed by ED physician and showed around 25,000 white blood cells, greater than 90% neutrophils.  Orthopedic surgery also saw the patient in the ED and felt like this was likely pseudogout and recommended treating as such, but recommended treatment with antibiotics until cultures come back negative.  Hospitalist were called for admission  *Acute left knee pain -secondary to pseudogout ; status post joint aspiration naproxen 500 mg twice daily with meals as treatment for pseudogout, synovial fluid cultures -negative Blood cultures are  negative for 5 days - discontinued  vancomycin , okay with orthopedics  *Acute urinary retention Improving with Flomax  Foley catheter and outpatient follow-up with urology in 5 to 7 days after discharge discussed with Dr. Junious Silk curbside, agreeable with the plan  *Constipation Senokot, MiraLAX, Dulcolax suppository  as needed  *Benign essential hypertension -continue home medications  *Rheumatoid arthritis ([place]) -continue home dose meds  *Diabetes ([place]) -sliding scale insulin with corresponding glucose checks  *Gastroesophageal reflux disease -Home dose PPI  *Hyperlipidemia -Home dose antilipid  Patient has significant bilateral lymphedema. He has been having issues with mobility carrying out his ADLs and progressively declining over the past one month. His wife has medical problems not able to help. His son also has issues with back pain and arthritis and not able to help patient around. 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   DISCHARGE CONDITIONS:  Stable   CONSULTS OBTAINED:  Treatment Team:  Leim Fabry, MD   PROCEDURES   DRUG ALLERGIES:   Allergies  Allergen Reactions  . Cefoperazone Anaphylaxis  . Penicillins Anaphylaxis and Other (See Comments)    Has patient had a PCN reaction causing immediate rash, facial/tongue/throat swelling, SOB or lightheadedness with hypotension: Yes Has patient had a PCN reaction causing severe rash involving mucus membranes or skin necrosis: No Has patient had a PCN reaction that required hospitalization No Has patient had a PCN reaction occurring within the last 10 years: No  If all of the above answers are "NO", then may proceed with Cephalosporin use.  . Latex Rash    DISCHARGE MEDICATIONS:   Allergies as of 02/24/2018      Reactions   Cefoperazone Anaphylaxis   Penicillins Anaphylaxis, Other (See Comments)   Has patient had a PCN  reaction causing immediate rash, facial/tongue/throat swelling, SOB or lightheadedness with hypotension: Yes Has patient had a PCN reaction causing severe rash involving mucus membranes or skin necrosis: No Has patient had a PCN reaction that required hospitalization No Has patient had a PCN reaction occurring within the last 10 years: No If all of the above answers are "NO", then may proceed with Cephalosporin use.   Latex Rash      Medication List    STOP taking these medications   losartan-hydrochlorothiazide 50-12.5 MG tablet Commonly known as:  HYZAAR     TAKE these medications   acetaminophen 325 MG tablet Commonly known as:  TYLENOL Take 2 tablets (650 mg total) by mouth every 6 (six) hours as needed for mild pain (or Fever >/= 101).   aspirin 81 MG chewable tablet Chew 81 mg by mouth daily.   B-complex with vitamin C tablet Take 1 tablet by mouth daily.   Cinnamon 500 MG capsule Take 500 mg by mouth 2 (two) times daily.   ENBREL SURECLICK 50 MG/ML injection Generic drug:  etanercept Inject 50 mg into the skin every Friday.   folic acid 409 MCG tablet Commonly known as:  FOLVITE Take 800 mcg by mouth daily.   gabapentin 300 MG capsule Commonly known as:  NEURONTIN Take 1 capsule (300 mg total) by mouth at bedtime.   hydroxychloroquine 200 MG tablet Commonly known as:  PLAQUENIL Take 400 mg by mouth at bedtime.   insulin aspart 100 UNIT/ML injection Commonly known as:  novoLOG Inject 0-9 Units into the skin 3 (three) times daily with meals. CBG < 70: implement hypoglycemia protocol CBG 70 - 120: 0 units CBG 121 - 150: 1 unit CBG 151 - 200: 2 units CBG 201 - 250: 3 units CBG 251 - 300: 5 units CBG 301 - 350: 7 units CBG 351 - 400: 9 units CBG > 400: call MD and obtain STAT lab verification   insulin aspart 100 UNIT/ML injection Commonly known as:  novoLOG Inject 0-5 Units into the skin at bedtime. CBG < 70: implement hypoglycemia protocol CBG 70 - 120:  0 units CBG 121 - 150: 0 units CBG 151 - 200: 0 units CBG 201 - 250: 2 units CBG 251 - 300: 3 units CBG 301 - 350: 4 units CBG 351 - 400: 5 units CBG > 400: call MD and obtain STAT lab verification   lamoTRIgine 200 MG tablet Commonly known as:  LAMICTAL Take 200 mg by mouth 2 (two) times daily.   magnesium oxide 400 MG tablet Commonly known as:  MAG-OX Take 400 mg by mouth daily.   methotrexate 2.5 MG tablet Commonly known as:  RHEUMATREX Take 25 mg by mouth every Monday.   naproxen 500 MG tablet Commonly known as:  NAPROSYN Take 1 tablet (500 mg total) by mouth 2 (two) times daily with a meal for 10 days.   omeprazole 40 MG capsule Commonly known as:  PRILOSEC Take 40 mg by mouth daily.   oxyCODONE 5 MG immediate release tablet Commonly known as:  Oxy IR/ROXICODONE Take 1 tablet (5 mg total) by mouth every 6 (six) hours as needed for up to 20 doses  for moderate pain.   polyethylene glycol packet Commonly known as:  MIRALAX / GLYCOLAX Take 17 g by mouth daily. Start taking on:  02/25/2018   tamsulosin 0.4 MG Caps capsule Commonly known as:  FLOMAX Take 1 capsule (0.4 mg total) by mouth daily. Start taking on:  02/25/2018   traZODone 50 MG tablet Commonly known as:  DESYREL Take 50 mg by mouth at bedtime.        DISCHARGE INSTRUCTIONS:   Follow-up with primary care physician at the facility in 3 days Follow-up with urology Dr. Junious Silk in 5 days Follow-up with orthopedics Dr. Leim Fabry in a week  DIET:  Cardiac diet and Diabetic diet  DISCHARGE CONDITION:  Fair  ACTIVITY:  Activity as tolerated  Per PT  OXYGEN:  Home Oxygen: No.   Oxygen Delivery: room air  DISCHARGE LOCATION:  nursing home   If you experience worsening of your admission symptoms, develop shortness of breath, life threatening emergency, suicidal or homicidal thoughts you must seek medical attention immediately by calling 911 or calling your MD immediately  if symptoms less  severe.  You Must read complete instructions/literature along with all the possible adverse reactions/side effects for all the Medicines you take and that have been prescribed to you. Take any new Medicines after you have completely understood and accpet all the possible adverse reactions/side effects.   Please note  You were cared for by a hospitalist during your hospital stay. If you have any questions about your discharge medications or the care you received while you were in the hospital after you are discharged, you can call the unit and asked to speak with the hospitalist on call if the hospitalist that took care of you is not available. Once you are discharged, your primary care physician will handle any further medical issues. Please note that NO REFILLS for any discharge medications will be authorized once you are discharged, as it is imperative that you return to your primary care physician (or establish a relationship with a primary care physician if you do not have one) for your aftercare needs so that they can reassess your need for medications and monitor your lab values.     Today  Chief Complaint  Patient presents with  . Leg Problem  . Foot Problem   Patient is doing fine.  Pain is manageable.  No other complaints.  Had a bowel movement  ROS:  CONSTITUTIONAL: Denies fevers, chills. Denies any fatigue, weakness.  EYES: Denies blurry vision, double vision, eye pain. EARS, NOSE, THROAT: Denies tinnitus, ear pain, hearing loss. RESPIRATORY: Denies cough, wheeze, shortness of breath.  CARDIOVASCULAR: Denies chest pain, palpitations, edema.  GASTROINTESTINAL: Denies nausea, vomiting, diarrhea, abdominal pain. Denies bright red blood per rectum. GENITOURINARY: Denies dysuria, hematuria. ENDOCRINE: Denies nocturia or thyroid problems. HEMATOLOGIC AND LYMPHATIC: Denies easy bruising or bleeding. SKIN: Denies rash or lesion. MUSCULOSKELETAL: Denies pain in neck, back, shoulder,  knees, hips or arthritic symptoms.  NEUROLOGIC: Denies paralysis, paresthesias.  PSYCHIATRIC: Denies anxiety or depressive symptoms.   VITAL SIGNS:  Blood pressure 128/77, pulse 71, temperature (!) 97.4 F (36.3 C), temperature source Oral, resp. rate 18, height 6' (1.829 m), weight 108.9 kg, SpO2 96 %.  I/O:    Intake/Output Summary (Last 24 hours) at 02/24/2018 1416 Last data filed at 02/24/2018 0900 Gross per 24 hour  Intake -  Output 1625 ml  Net -1625 ml    PHYSICAL EXAMINATION:  GENERAL:  77 y.o.-year-old patient lying in the bed with  no acute distress.  EYES: Pupils equal, round, reactive to light and accommodation. No scleral icterus. Extraocular muscles intact.  HEENT: Head atraumatic, normocephalic. Oropharynx and nasopharynx clear.  NECK:  Supple, no jugular venous distention. No thyroid enlargement, no tenderness.  LUNGS: Normal breath sounds bilaterally, no wheezing, rales,rhonchi or crepitation. No use of accessory muscles of respiration.  CARDIOVASCULAR: S1, S2 normal. No murmurs, rubs, or gallops.  ABDOMEN: Soft, non-tender, non-distended. Bowel sounds present.  EXTREMITIES: Left knee edema and erythema are improving  no pedal edema, cyanosis, or clubbing.  NEUROLOGIC: Cranial nerves II through XII are intact. Sensation intact. Gait not checked.  PSYCHIATRIC: The patient is alert and oriented x 3.  SKIN: No obvious rash, lesion, or ulcer.   DATA REVIEW:   CBC Recent Labs  Lab 02/21/18 0634  WBC 5.1  HGB 11.1*  HCT 32.0*  PLT 205    Chemistries  Recent Labs  Lab 02/20/18 1249 02/21/18 0634  NA 137 136  K 3.4* 3.4*  CL 100 101  CO2 31 28  GLUCOSE 112* 92  BUN 14 12  CREATININE 0.92 0.60*  CALCIUM 9.1 8.2*  AST 18  --   ALT 13  --   ALKPHOS 77  --   BILITOT 0.8  --     Cardiac Enzymes Recent Labs  Lab 02/20/18 1249  TROPONINI <0.03    Microbiology Results  Results for orders placed or performed during the hospital encounter of  02/20/18  Body fluid culture     Status: None   Collection Time: 02/20/18  1:18 PM  Result Value Ref Range Status   Specimen Description   Final    SYNOVIAL Performed at [facility], [address], [place], [place] [address]    Special Requests   Final    NONE Performed at [facility], [address] [place], [place] [address]    Gram Stain   Final    MODERATE WBC PRESENT, PREDOMINANTLY PMN NO ORGANISMS SEEN    Culture   Final    NO GROWTH 3 DAYS Performed at [facility], [place] [address], [place], [place] [address]    Report Status 02/23/2018 FINAL  Final  Gram stain     Status: None   Collection Time: 02/20/18  6:12 PM  Result Value Ref Range Status   Specimen Description KNEE  Final   Special Requests NONE  Final   Gram Stain   Final    NO ORGANISMS SEEN MANY WBC SEEN Performed at [facility], [address], [place],  [address]    Report Status 02/20/2018 FINAL  Final    RADIOLOGY:  Dg Knee Complete 4 Views Left  Result Date: 02/20/2018 CLINICAL DATA:  Left knee pain, swelling and redness. Multiple recent falls. EXAM: LEFT KNEE - COMPLETE 4+ VIEW COMPARISON:  None. FINDINGS: Moderate to marked lateral joint space narrowing and mild to moderate spur formation. Mild medial and patellofemoral spur formation. Posterior patellar and suprapatellar loose bodies. No effusion. Medial and lateral meniscal calcifications. Mild lateral subluxation of the tibia relative to the distal femur. No fracture or dislocation. IMPRESSION: 1. Degenerative changes, as described above. 2. Chondrocalcinosis. 3. Loose bodies. Electronically Signed   By: Claudie Revering M.D.   On: 02/20/2018 18:11    EKG:   Orders placed or performed during the hospital encounter of 02/20/18  . ED EKG  . ED EKG      Management plans discussed with the patient, family and  they are in agreement.  CODE STATUS:     Code Status Orders  (From admission, onward)          Start     Ordered   02/20/18 2220  Full code  Continuous     02/20/18 2220        Code Status History    Date Active Date Inactive Code Status Order ID Comments User Context   05/23/2015 2221 05/26/2015 1741 Full Code [id number]  Hower, Aaron Mose, MD ED      TOTAL TIME TAKING CARE OF THIS PATIENT: 43  minutes.   Note: This dictation was prepared with Dragon dictation along with smaller phrase technology. Any transcriptional errors that result from this process are unintentional.   @[facility] @  on 02/24/2018 at 2:16 PM  Between 7am to 6pm - Pager - [phone]  After 6pm go to [email or web address] - password [email or web address] [facility]  [facility]  Office  [phone]  CC: Primary care physician; Derinda Late, MD

## 2018-02-24 NOTE — Care Management Important Message (Signed)
Copy of signed IM left with patient in room.  

## 2018-02-24 NOTE — Progress Notes (Signed)
[place] [facility] [place] NAME: Jeremiah [name]    MR#:  [id number]  DATE OF BIRTH:  06-25-1940  SUBJECTIVE:   Patient had a bowel movement.  Foley catheter placed for urinary retention  Awaiting insurance authorization REVIEW OF SYSTEMS:   Review of Systems  Constitutional: Negative for chills, fever and weight loss.  HENT: Negative for ear discharge, ear pain and nosebleeds.   Eyes: Negative for blurred vision, pain and discharge.  Respiratory: Negative for sputum production, shortness of breath, wheezing and stridor.   Cardiovascular: Negative for chest pain, palpitations, orthopnea and PND.  Gastrointestinal: Negative for abdominal pain, diarrhea, nausea and vomiting.  Genitourinary: Negative for frequency and urgency.  Musculoskeletal: Positive for joint pain. Negative for back pain.  Neurological: Negative for sensory change, speech change, focal weakness and weakness.  Psychiatric/Behavioral: Negative for depression and hallucinations. The patient is not nervous/anxious.    Tolerating Diet:yes Tolerating PT: rehab  DRUG ALLERGIES:   Allergies  Allergen Reactions  . Cefoperazone Anaphylaxis  . Penicillins Anaphylaxis and Other (See Comments)    Has patient had a PCN reaction causing immediate rash, facial/tongue/throat swelling, SOB or lightheadedness with hypotension: Yes Has patient had a PCN reaction causing severe rash involving mucus membranes or skin necrosis: No Has patient had a PCN reaction that required hospitalization No Has patient had a PCN reaction occurring within the last 10 years: No If all of the above answers are "NO", then may proceed with Cephalosporin use.  . Latex Rash    VITALS:  Blood pressure 128/77, pulse 71, temperature (!) 97.4 F (36.3 C), temperature source Oral, resp. rate 18, height 6' (1.829 m), weight 108.9 kg, SpO2 96 %.  PHYSICAL EXAMINATION:   Physical Exam  GENERAL:  77  y.o.-year-old patient lying in the bed with no acute distress.  EYES: Pupils equal, round, reactive to light and accommodation. No scleral icterus. Extraocular muscles intact.  HEENT: Head atraumatic, normocephalic. Oropharynx and nasopharynx clear.  NECK:  Supple, no jugular venous distention. No thyroid enlargement, no tenderness.  LUNGS: Normal breath sounds bilaterally, no wheezing, rales, rhonchi. No use of accessory muscles of respiration.  CARDIOVASCULAR: S1, S2 normal. No murmurs, rubs, or gallops.  ABDOMEN: Soft, nontender, nondistended. Bowel sounds present. No organomegaly or mass.  EXTREMITIES: bilateral lower extremity lymphedema. No erythema or cellulitis over the left knee NEUROLOGIC: Cranial nerves II through XII are intact. No focal Motor or sensory deficits b/l.   PSYCHIATRIC:  patient is alert and oriented x 3.  SKIN: No obvious rash, lesion, or ulcer.   LABORATORY PANEL:  CBC Recent Labs  Lab 02/21/18 0634  WBC 5.1  HGB 11.1*  HCT 32.0*  PLT 205    Chemistries  Recent Labs  Lab 02/20/18 1249 02/21/18 0634  NA 137 136  K 3.4* 3.4*  CL 100 101  CO2 31 28  GLUCOSE 112* 92  BUN 14 12  CREATININE 0.92 0.60*  CALCIUM 9.1 8.2*  AST 18  --   ALT 13  --   ALKPHOS 77  --   BILITOT 0.8  --    Cardiac Enzymes Recent Labs  Lab 02/20/18 1249  TROPONINI <0.03   RADIOLOGY:  No results found. ASSESSMENT AND PLAN:  Jeremiah [name]  is a 77 y.o. male who presents with chief complaint as above.  Patient presents to the emergency department with history of progressively increasing left knee pain.  Patient has bilateral lower extremity lymphedema, but he stated  his left knee started swelling more over the past several days.  Today it was acutely worse and he was unable to stand up on it and get around his house to perform his activities of daily living  Acute left knee pain -secondary to pseudogout ; status post joint aspiration naproxen 500 mg twice daily with meals  as treatment for pseudogout, synovial fluid cultures -negative Blood cultures are negative for 5 days - discontinued  vancomycin , okay with orthopedics  *Acute urinary retention Improving with Flomax Foley catheter and outpatient follow-up with urology in 5 to 7 days after discharge discussed with Dr. Junious Silk curbside, agreeable with the plan  *Constipation Senokot, MiraLAX, Dulcolax suppository as needed  *Benign essential hypertension -continue home medications  *Rheumatoid arthritis ([place]) -continue home dose meds  *Diabetes ([place]) -sliding scale insulin with corresponding glucose checks  *Gastroesophageal reflux disease -Home dose PPI  *Hyperlipidemia -Home dose antilipid  Patient has significant bilateral lymphedema. He has been having issues with mobility carrying out his ADLs and progressively declining over the past one month. His wife has medical problems not able to help. His son also has issues with back pain and arthritis and not able to help patient around. 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 Management plans discussed with the patient, family and they are in agreement.  CODE STATUS: full  DVT Prophylaxis: lovenox  TOTAL TIME TAKING CARE OF THIS PATIENT: 33  minutes.  >50% time spent on counselling and coordination of care  POSSIBLE D/C IN *1-2 DAYS, DEPENDING ON CLINICAL CONDITION.  Note: This dictation was prepared with Dragon dictation along with smaller phrase technology. Any transcriptional errors that result from this process are unintentional.  Nicholes Mango M.D on 02/24/2018 at 2:17 PM  Between 7am to 6pm - Pager - [phone]  After 6pm go to [email or web address] - password [email or web address] [facility]  Office   [phone]  CC: Primary care physician; Derinda Late, MDPatient ID: Jeremiah [name]., male   DOB: Aug 01, 1940, 77 y.o.   MRN: [id number]

## 2018-02-25 DIAGNOSIS — R339 Retention of urine, unspecified: Secondary | ICD-10-CM | POA: Insufficient documentation

## 2018-02-25 DIAGNOSIS — M069 Rheumatoid arthritis, unspecified: Secondary | ICD-10-CM | POA: Insufficient documentation

## 2018-02-27 DIAGNOSIS — E119 Type 2 diabetes mellitus without complications: Secondary | ICD-10-CM | POA: Insufficient documentation

## 2018-02-27 DIAGNOSIS — Z96651 Presence of right artificial knee joint: Secondary | ICD-10-CM | POA: Insufficient documentation

## 2018-02-27 DIAGNOSIS — E669 Obesity, unspecified: Secondary | ICD-10-CM | POA: Insufficient documentation

## 2018-02-27 DIAGNOSIS — M1712 Unilateral primary osteoarthritis, left knee: Secondary | ICD-10-CM | POA: Insufficient documentation

## 2018-03-05 ENCOUNTER — Ambulatory Visit: Payer: Medicare Other | Admitting: Urology

## 2018-03-05 ENCOUNTER — Encounter: Payer: Self-pay | Admitting: Urology

## 2018-03-05 VITALS — BP 116/73 | HR 76

## 2018-03-05 DIAGNOSIS — N401 Enlarged prostate with lower urinary tract symptoms: Secondary | ICD-10-CM | POA: Diagnosis not present

## 2018-03-05 DIAGNOSIS — R339 Retention of urine, unspecified: Secondary | ICD-10-CM | POA: Diagnosis not present

## 2018-03-05 NOTE — Progress Notes (Signed)
03/05/2018 8:38 AM   Jeremiah [name] 05-03-1941 [id number]  Referring provider: Derinda Late, MD [phone] [address] [facility] [place], [place] [address]  Chief Complaint  Patient presents with  . Urinary Retention    HPI: 77 year old male is admitted to [facility] on 02/20/2018 with acute left knee pain and lower extremity edema.  He was diagnosed with pseudogout.  He developed acute urinary retention while hospitalized necessitating Foley catheter placement.  He was started on tamsulosin.  He states a voiding trial was not performed during his hospitalization and he was discharged to [facility] with an indwelling Foley.  He denies prior history of urologic problems or previous episodes of urinary retention.  He denies previous urologic evaluation.  He was started on tamsulosin during that hospitalization which he has continued.  He has chronic low back pain and several lumbar surgeries.   PMH: Past Medical History:  Diagnosis Date  . Allergy   . Arthritis   . Hypertension   . Rheumatoid arthritis ([place])   . Seizures ([place])   . Sepsis ([place]) 05/23/2015    Surgical History: Past Surgical History:  Procedure Laterality Date  . back surgery five times  04/2016  . CARPAL TUNNEL RELEASE    . HERNIA REPAIR    . JOINT REPLACEMENT    . knee replacement right    . right hip replacement    . rotator cuff surgery    . SPINE SURGERY      Home Medications:  Allergies as of 03/05/2018      Reactions   Cefoperazone Anaphylaxis   Penicillins Anaphylaxis, Other (See Comments)   Has patient had a PCN reaction causing immediate rash, facial/tongue/throat swelling, SOB or lightheadedness with hypotension: Yes Has patient had a PCN reaction causing severe rash involving mucus membranes or skin necrosis: No Has patient had a PCN reaction that required hospitalization No Has patient had a PCN reaction occurring within the last 10 years: No If all of  the above answers are "NO", then may proceed with Cephalosporin use.   Latex Rash      Medication List        Accurate as of 03/05/18  8:38 AM. Always use your most recent med list.          acetaminophen 325 MG tablet Commonly known as:  TYLENOL Take 2 tablets (650 mg total) by mouth every 6 (six) hours as needed for mild pain (or Fever >/= 101).   aspirin 81 MG chewable tablet Chew 81 mg by mouth daily.   B-complex with vitamin C tablet Take 1 tablet by mouth daily.   Cinnamon 500 MG capsule Take 500 mg by mouth 2 (two) times daily.   ENBREL SURECLICK 50 MG/ML injection Generic drug:  etanercept Inject 50 mg into the skin every Friday.   folic acid 332 MCG tablet Commonly known as:  FOLVITE Take 800 mcg by mouth daily.   gabapentin 300 MG capsule Commonly known as:  NEURONTIN Take 1 capsule (300 mg total) by mouth at bedtime.   hydroxychloroquine 200 MG tablet Commonly known as:  PLAQUENIL Take 400 mg by mouth at bedtime.   insulin aspart 100 UNIT/ML injection Commonly known as:  novoLOG Inject 0-9 Units into the skin 3 (three) times daily with meals. CBG < 70: implement hypoglycemia protocol CBG 70 - 120: 0 units CBG 121 - 150: 1 unit CBG 151 - 200: 2 units CBG 201 - 250: 3  units CBG 251 - 300: 5 units CBG 301 - 350: 7 units CBG 351 - 400: 9 units CBG > 400: call MD and obtain STAT lab verification   insulin aspart 100 UNIT/ML injection Commonly known as:  novoLOG Inject 0-5 Units into the skin at bedtime. CBG < 70: implement hypoglycemia protocol CBG 70 - 120: 0 units CBG 121 - 150: 0 units CBG 151 - 200: 0 units CBG 201 - 250: 2 units CBG 251 - 300: 3 units CBG 301 - 350: 4 units CBG 351 - 400: 5 units CBG > 400: call MD and obtain STAT lab verification   lamoTRIgine 200 MG tablet Commonly known as:  LAMICTAL Take 200 mg by mouth 2 (two) times daily.   magnesium oxide 400 MG tablet Commonly known as:  MAG-OX Take 400 mg by mouth daily.     methotrexate 2.5 MG tablet Commonly known as:  RHEUMATREX Take 25 mg by mouth every Monday.   naproxen 500 MG tablet Commonly known as:  NAPROSYN Take 1 tablet (500 mg total) by mouth 2 (two) times daily with a meal for 10 days.   omeprazole 40 MG capsule Commonly known as:  PRILOSEC Take 40 mg by mouth daily.   oxyCODONE 5 MG immediate release tablet Commonly known as:  Oxy IR/ROXICODONE Take 1 tablet (5 mg total) by mouth every 6 (six) hours as needed for up to 20 doses for moderate pain.   polyethylene glycol packet Commonly known as:  MIRALAX / GLYCOLAX Take 17 g by mouth daily.   tamsulosin 0.4 MG Caps capsule Commonly known as:  FLOMAX Take 1 capsule (0.4 mg total) by mouth daily.   traZODone 50 MG tablet Commonly known as:  DESYREL Take 50 mg by mouth at bedtime.       Allergies:  Allergies  Allergen Reactions  . Cefoperazone Anaphylaxis  . Penicillins Anaphylaxis and Other (See Comments)    Has patient had a PCN reaction causing immediate rash, facial/tongue/throat swelling, SOB or lightheadedness with hypotension: Yes Has patient had a PCN reaction causing severe rash involving mucus membranes or skin necrosis: No Has patient had a PCN reaction that required hospitalization No Has patient had a PCN reaction occurring within the last 10 years: No If all of the above answers are "NO", then may proceed with Cephalosporin use.  . Latex Rash    Family History: Family History  Problem Relation Age of Onset  . Hypertension Other   . Hypertension Mother   . Dementia Mother   . Dementia Father   . Prostate cancer Neg Hx   . Bladder Cancer Neg Hx   . Kidney cancer Neg Hx     Social History:  reports that he has quit smoking. His smoking use included cigarettes. He has a 20.00 pack-year smoking history. He has never used smokeless tobacco. He reports that he does not drink alcohol. His drug history is not on file.  ROS: UROLOGY Frequent Urination?: No Hard  to postpone urination?: Yes Burning/pain with urination?: No Get up at night to urinate?: No Leakage of urine?: No Urine stream starts and stops?: Yes Trouble starting stream?: Yes Do you have to strain to urinate?: No Blood in urine?: No Urinary tract infection?: No Sexually transmitted disease?: No Injury to kidneys or bladder?: No Painful intercourse?: No Weak stream?: No Erection problems?: No Penile pain?: No  Gastrointestinal Nausea?: No Vomiting?: No Indigestion/heartburn?: No Diarrhea?: No Constipation?: Yes  Constitutional Fever: No Night sweats?: No Weight loss?:  No Fatigue?: No  Skin Skin rash/lesions?: Yes Itching?: No  Eyes Blurred vision?: No Double vision?: No  Ears/Nose/Throat Sore throat?: No Sinus problems?: No  Hematologic/Lymphatic Swollen glands?: No Easy bruising?: No  Cardiovascular Leg swelling?: Yes Chest pain?: No  Respiratory Cough?: No Shortness of breath?: No  Endocrine Excessive thirst?: No  Musculoskeletal Back pain?: Yes Joint pain?: Yes  Neurological Headaches?: No Dizziness?: No  Psychologic Depression?: No Anxiety?: No  Physical Exam: BP 116/73 (BP Location: Left Arm, Patient Position: Sitting, Cuff Size: Large)   Pulse 76   Constitutional:  Alert and oriented, No acute distress. HEENT: [place] AT, moist mucus membranes.  Trachea midline, no masses. Cardiovascular: No clubbing, cyanosis, or edema. Respiratory: Normal respiratory effort, no increased work of breathing. GI: Abdomen is soft, nontender, nondistended, no abdominal masses GU: No CVA tenderness Lymph: No cervical or inguinal lymphadenopathy. Skin: No rashes, bruises or suspicious lesions. Neurologic: Grossly intact, no focal deficits, moving all 4 extremities. Psychiatric: Normal mood and affect.   Assessment & Plan:   77 year old male with acute urinary retention.  His Foley catheter was removed for voiding trial.  An order was written to  replace the catheter for recurrent retention at his SNF.  If he voids successful he will follow-up in 2 weeks for nurse visit/bladder scan for PVR and a 6-week follow-up with me for repeat PVR and DRE.  If he requires replacement of his Foley catheter he will return for cystoscopy.   Return in about 6 weeks (around 04/16/2018) for Recheck, Bladder scan.  Abbie Sons, [facility] [address], [place], [place] [address] [phone]

## 2018-03-06 ENCOUNTER — Telehealth: Payer: Self-pay | Admitting: Urology

## 2018-03-06 NOTE — Telephone Encounter (Signed)
Pt wife lmom stating she has questions about her husband AVS, it has medications listed on his AVS that the pt has never taken or been prescribed, also has questions about his follow up appt. Please advise wife, [name] at [phone].

## 2018-03-06 NOTE — Telephone Encounter (Signed)
LMOM for patient to return call.

## 2018-03-07 NOTE — Telephone Encounter (Signed)
Spoke to patient's wife and explained the information on the patient's AVS.  She is going to contact the patient's PCP for a follow up.

## 2018-04-15 ENCOUNTER — Ambulatory Visit (INDEPENDENT_AMBULATORY_CARE_PROVIDER_SITE_OTHER): Payer: Medicare Other

## 2018-04-15 DIAGNOSIS — R339 Retention of urine, unspecified: Secondary | ICD-10-CM

## 2018-04-15 NOTE — Progress Notes (Signed)
Patient presents in clinic for PVR for evaluation of urinary retention:  Bladder Scan Patient is able to void on his own with no complications. PVR: 132mL void.  Performed By: Gordy Clement, [place] ([place])  Patient to f/u in 48mos for follow up, sooner if needed.

## 2018-06-08 ENCOUNTER — Other Ambulatory Visit: Payer: Self-pay | Admitting: Pain Medicine

## 2018-06-08 DIAGNOSIS — M792 Neuralgia and neuritis, unspecified: Secondary | ICD-10-CM

## 2018-06-16 ENCOUNTER — Encounter (INDEPENDENT_AMBULATORY_CARE_PROVIDER_SITE_OTHER): Payer: Self-pay | Admitting: Vascular Surgery

## 2018-06-16 ENCOUNTER — Ambulatory Visit (INDEPENDENT_AMBULATORY_CARE_PROVIDER_SITE_OTHER): Payer: Medicare Other | Admitting: Vascular Surgery

## 2018-06-16 VITALS — BP 149/82 | HR 78 | Resp 18 | Ht 72.0 in | Wt 259.6 lb

## 2018-06-16 DIAGNOSIS — I890 Lymphedema, not elsewhere classified: Secondary | ICD-10-CM

## 2018-06-16 DIAGNOSIS — M069 Rheumatoid arthritis, unspecified: Secondary | ICD-10-CM

## 2018-06-16 DIAGNOSIS — I10 Essential (primary) hypertension: Secondary | ICD-10-CM

## 2018-06-16 DIAGNOSIS — I739 Peripheral vascular disease, unspecified: Secondary | ICD-10-CM | POA: Insufficient documentation

## 2018-06-16 DIAGNOSIS — I872 Venous insufficiency (chronic) (peripheral): Secondary | ICD-10-CM | POA: Diagnosis not present

## 2018-06-16 DIAGNOSIS — I83019 Varicose veins of right lower extremity with ulcer of unspecified site: Secondary | ICD-10-CM

## 2018-06-16 DIAGNOSIS — M5136 Other intervertebral disc degeneration, lumbar region: Secondary | ICD-10-CM

## 2018-06-16 DIAGNOSIS — I83009 Varicose veins of unspecified lower extremity with ulcer of unspecified site: Secondary | ICD-10-CM | POA: Insufficient documentation

## 2018-06-16 DIAGNOSIS — L97909 Non-pressure chronic ulcer of unspecified part of unspecified lower leg with unspecified severity: Secondary | ICD-10-CM

## 2018-06-16 DIAGNOSIS — E782 Mixed hyperlipidemia: Secondary | ICD-10-CM

## 2018-06-16 DIAGNOSIS — L97919 Non-pressure chronic ulcer of unspecified part of right lower leg with unspecified severity: Secondary | ICD-10-CM

## 2018-06-16 NOTE — Progress Notes (Signed)
MRN : [id number]  Jeremiah [name]. is a 78 y.o. (Oct 01, 1940) male who presents with chief complaint of  Chief Complaint  Patient presents with  . Establish Care  .  History of Present Illness: Patient is seen for evaluation of leg swelling. The patient first noticed the swelling remotely but is now concerned because of a significant increase in the overall edema. The swelling is associated with pain and discoloration. The patient notes that in the morning the legs are significantly improved but they steadily worsened throughout the course of the day. Elevation makes the legs better, dependency makes them much worse.   There is no history of ulcerations associated with the swelling.   The patient denies any recent changes in their medications.  The patient has not been wearing graduated compression.  The patient has no had any past angiography, interventions or vascular surgery.  The patient denies a history of DVT or PE. There is no prior history of phlebitis. There is no history of primary lymphedema.  There is no history of radiation treatment to the groin or pelvis No history of malignancies. No history of trauma or groin or pelvic surgery. No history of foreign travel or parasitic infections area   No outpatient medications have been marked as taking for the 06/16/18 encounter (Office Visit) with Delana Meyer, Dolores Lory, MD.    Past Medical History:  Diagnosis Date  . Allergy   . Arthritis   . Hypertension   . Rheumatoid arthritis ([place])   . Seizures ([place])   . Sepsis ([place]) 05/23/2015    Past Surgical History:  Procedure Laterality Date  . back surgery five times  04/2016  . CARPAL TUNNEL RELEASE    . HERNIA REPAIR    . JOINT REPLACEMENT    . knee replacement right    . right hip replacement    . rotator cuff surgery    . SPINE SURGERY      Social History Social History   Tobacco Use  . Smoking status: Former Smoker    Packs/day: 1.00    Years: 20.00    Pack  years: 20.00    Types: Cigarettes  . Smokeless tobacco: Never Used  Substance Use Topics  . Alcohol use: No  . Drug use: Not on file    Family History Family History  Problem Relation Age of Onset  . Hypertension Other   . Hypertension Mother   . Dementia Mother   . Dementia Father   . Prostate cancer Neg Hx   . Bladder Cancer Neg Hx   . Kidney cancer Neg Hx   No family history of bleeding/clotting disorders, porphyria or autoimmune disease   Allergies  Allergen Reactions  . Cefoperazone Anaphylaxis  . Penicillins Anaphylaxis and Other (See Comments)    Has patient had a PCN reaction causing immediate rash, facial/tongue/throat swelling, SOB or lightheadedness with hypotension: Yes Has patient had a PCN reaction causing severe rash involving mucus membranes or skin necrosis: No Has patient had a PCN reaction that required hospitalization No Has patient had a PCN reaction occurring within the last 10 years: No If all of the above answers are "NO", then may proceed with Cephalosporin use.  . Latex Rash     REVIEW OF SYSTEMS (Negative unless checked)  Constitutional: [] Weight loss  [] Fever  [] Chills Cardiac: [] Chest pain   [] Chest pressure   [] Palpitations   [] Shortness of breath when laying flat   [] Shortness of breath with exertion. Vascular:  []   Pain in legs with walking   [x] Pain in legs with dependency  [] History of DVT   [] Phlebitis   [x] Swelling in legs   [] Varicose veins   [] Non-healing ulcers Pulmonary:   [] Uses home oxygen   [] Productive cough   [] Hemoptysis   [] Wheeze  [] COPD   [] Asthma Neurologic:  [] Dizziness   [] Seizures   [] History of stroke   [] History of TIA  [] Aphasia   [] Vissual changes   [] Weakness or numbness in arm   [] Weakness or numbness in leg Musculoskeletal:   [] Joint swelling   [x] Joint pain   [x] Low back pain Hematologic:  [] Easy bruising  [] Easy bleeding   [] Hypercoagulable state   [] Anemic Gastrointestinal:  [] Diarrhea   [] Vomiting   [] Gastroesophageal reflux/heartburn   [] Difficulty swallowing. Genitourinary:  [] Chronic kidney disease   [] Difficult urination  [] Frequent urination   [] Blood in urine Skin:  [x] Rashes   [x] Ulcers  Psychological:  [] History of anxiety   []  History of major depression.  Physical Examination  There were no vitals filed for this visit. There is no height or weight on file to calculate BMI. Gen: WD/WN, NAD obese seen in a wheelchair Head: [place]/AT, No temporalis wasting.  Ear/Nose/Throat: Hearing grossly intact, nares w/o erythema or drainage, poor dentition Eyes: PER, EOMI, sclera nonicteric.  Neck: Supple, no masses.  No bruit or JVD.  Pulmonary:  Good air movement, clear to auscultation bilaterally, no use of accessory muscles.  Cardiac: RRR, normal S1, S2, no Murmurs. Vascular: 3-4+ edema of the right and left leg with severe venous changes of both legs.  Venous ulcer noted in the ankle area on the right, noninfected superficial Vessel Right Left  Radial Palpable Palpable  PT Not Palpable Not Palpable  DP Not Palpable Not Palpable   Gastrointestinal: soft, non-distended. No guarding/no peritoneal signs.  Musculoskeletal: M/S 5/5 throughout.  No deformity or atrophy.  Neurologic: CN 2-12 intact. Pain and light touch intact in extremities.  Symmetrical.  Speech is fluent. Motor exam as listed above. Psychiatric: Judgment intact, Mood & affect appropriate for pt's clinical situation. Dermatologic: severe venous stasis dermatitis with ulcers present on the right.  No changes consistent with cellulitis. Lymph : No Cervical lymphadenopathy, no lichenification or skin changes of chronic lymphedema.  CBC Lab Results  Component Value Date   WBC 5.1 02/21/2018   HGB 11.1 (L) 02/21/2018   HCT 32.0 (L) 02/21/2018   MCV 87.1 02/21/2018   PLT 205 02/21/2018    BMET    Component Value Date/Time   NA 136 02/21/2018 0634   NA 133 (L) 01/14/2017 1048   K 3.4 (L) 02/21/2018 0634   CL 101  02/21/2018 0634   CO2 28 02/21/2018 0634   GLUCOSE 92 02/21/2018 0634   BUN 12 02/21/2018 0634   BUN 11 01/14/2017 1048   CREATININE 0.60 (L) 02/21/2018 0634   CALCIUM 8.2 (L) 02/21/2018 0634   GFRNONAA >60 02/21/2018 0634   GFRAA >60 02/21/2018 0634   CrCl cannot be calculated (Patient's most recent lab result is older than the maximum 21 days allowed.).  COAG No results found for: INR, PROTIME  Radiology No results found.   Assessment/Plan 1. Chronic venous insufficiency No surgery or intervention at this point in time.    I have had a long discussion with the patient regarding venous insufficiency and why it  causes symptoms, specifically venous ulceration . I have discussed with the patient the chronic skin changes that accompany venous insufficiency and the long term sequela such as  infection and recurring  ulceration.  Patient will be placed in [employer] which will be changed weekly drainage permitting.  Under my supervision the patient's left and right legs were wrapped in Unna boots.  In addition, behavioral modification including several periods of elevation of the lower extremities during the day will be continued. Achieving a position with the ankles at heart level was stressed to the patient  The patient is instructed to begin routine exercise, especially walking on a daily basis  Patient should undergo duplex ultrasound of the venous system to ensure that DVT or reflux is not present.  Following the review of the ultrasound the patient will follow up in one week to reassess the degree of swelling and the control that Unna therapy is offering.   The patient can be assessed for graduated compression stockings or wraps as well as a Lymph Pump once the ulcers are healed.   A total of 70 minutes was spent with this patient and greater than 50% was spent in counseling and coordination of care with the patient.  Discussion included the treatment options for vascular disease  including indications for surgery and intervention.  Also discussed is the appropriate timing of treatment.  In addition medical therapy was discussed.  - VAS [place] LOWER EXTREMITY VENOUS REFLUX; Future  2. Venous stasis ulcer of right lower extremity ([place]) See #1  - VAS [place] LOWER EXTREMITY VENOUS REFLUX; Future  3. PAD (peripheral artery disease) (HCC)  Recommend:  The patient has evidence of atherosclerosis of the lower extremities.    Noninvasive studies will be obtained to ensure he has adequate perfusion for healing.  No invasive studies, angiography or surgery at this time The patient should continue walking and begin a more formal exercise program.  The patient should continue antiplatelet therapy and aggressive treatment of the lipid abnormalities  No changes in the patient's medications at this time  - VAS [place] ABI WITH/WO TBI; Future  4. Lymphedema No surgery or intervention at this point in time.    I have had a long discussion with the patient regarding venous insufficiency and why it  causes symptoms. I have discussed with the patient the chronic skin changes that accompany venous insufficiency and the long term sequela such as infection and ulceration.  Patient will begin wearing graduated compression stockings class 1 (20-30 mmHg) or compression wraps on a daily basis a prescription was given. The patient will put the stockings on first thing in the morning and removing them in the evening. The patient is instructed specifically not to sleep in the stockings.    In addition, behavioral modification including several periods of elevation of the lower extremities during the day will be continued. I have demonstrated that proper elevation is a position with the ankles at heart level.  The patient is instructed to begin routine exercise, especially walking on a daily basis  Patient should undergo duplex ultrasound of the venous system to ensure that DVT or reflux is not  present.  Following the review of the ultrasound the patient will follow up in 2-3 months to reassess the degree of swelling and the control that graduated compression stockings or compression wraps  is offering.   The patient can be assessed for a Lymph Pump at that time  5. DDD (degenerative disc disease), lumbar Continue NSAID medications as already ordered, these medications have been reviewed and there are no changes at this time.  Continued activity and therapy was stressed.  Knee replacement  can not be done until his wounds are healed and his edema is controlled   6. Mixed hyperlipidemia Continue statin as ordered and reviewed, no changes at this time   7. Rheumatoid arthritis involving multiple sites, unspecified rheumatoid factor presence (HCC) Continue NSAID medications as already ordered, these medications have been reviewed and there are no changes at this time.  Continued activity and therapy was stressed.  Knee replacement can not be done until his wounds are healed and his edema is controlled  8. Benign essential hypertension Continue antihypertensive medications as already ordered, these medications have been reviewed and there are no changes at this time.      Hortencia Pilar, MD  06/16/2018 8:40 AM

## 2018-06-18 ENCOUNTER — Encounter: Payer: Self-pay | Admitting: Nurse Practitioner

## 2018-06-20 ENCOUNTER — Ambulatory Visit (INDEPENDENT_AMBULATORY_CARE_PROVIDER_SITE_OTHER): Payer: Medicare Other

## 2018-06-20 ENCOUNTER — Encounter (INDEPENDENT_AMBULATORY_CARE_PROVIDER_SITE_OTHER): Payer: Self-pay

## 2018-06-20 ENCOUNTER — Ambulatory Visit (INDEPENDENT_AMBULATORY_CARE_PROVIDER_SITE_OTHER): Payer: Medicare Other | Admitting: Vascular Surgery

## 2018-06-20 VITALS — BP 146/82 | HR 75 | Ht 72.0 in | Wt 253.8 lb

## 2018-06-20 DIAGNOSIS — I83019 Varicose veins of right lower extremity with ulcer of unspecified site: Secondary | ICD-10-CM

## 2018-06-20 DIAGNOSIS — I890 Lymphedema, not elsewhere classified: Secondary | ICD-10-CM

## 2018-06-20 DIAGNOSIS — L97919 Non-pressure chronic ulcer of unspecified part of right lower leg with unspecified severity: Secondary | ICD-10-CM | POA: Diagnosis not present

## 2018-06-20 DIAGNOSIS — I872 Venous insufficiency (chronic) (peripheral): Secondary | ICD-10-CM | POA: Diagnosis not present

## 2018-06-20 DIAGNOSIS — I739 Peripheral vascular disease, unspecified: Secondary | ICD-10-CM

## 2018-06-20 NOTE — Progress Notes (Signed)
History of Present Illness  There is no documented history at this time  Assessments & Plan   There are no diagnoses linked to this encounter.    Additional instructions  Subjective:  Patient presents with venous ulcer of the Bilateral lower extremity.    Procedure:  3 layer unna wrap was placed Bilateral lower extremity.   Plan:   Follow up in one week.  

## 2018-06-23 ENCOUNTER — Encounter (INDEPENDENT_AMBULATORY_CARE_PROVIDER_SITE_OTHER): Payer: Medicare Other

## 2018-06-26 ENCOUNTER — Encounter (INDEPENDENT_AMBULATORY_CARE_PROVIDER_SITE_OTHER): Payer: Medicare Other

## 2018-06-27 ENCOUNTER — Encounter (INDEPENDENT_AMBULATORY_CARE_PROVIDER_SITE_OTHER): Payer: Self-pay

## 2018-06-27 ENCOUNTER — Ambulatory Visit (INDEPENDENT_AMBULATORY_CARE_PROVIDER_SITE_OTHER): Payer: Medicare Other | Admitting: Nurse Practitioner

## 2018-06-27 VITALS — BP 129/77 | HR 82 | Resp 14 | Ht 73.0 in | Wt 259.6 lb

## 2018-06-27 DIAGNOSIS — I890 Lymphedema, not elsewhere classified: Secondary | ICD-10-CM

## 2018-06-27 NOTE — Progress Notes (Signed)
History of Present Illness  There is no documented history at this time  Assessments & Plan   There are no diagnoses linked to this encounter.    Additional instructions  Subjective:  Patient presents with venous ulcer of the Bilateral lower extremity.    Procedure:  3 layer unna wrap was placed Bilateral lower extremity.   Plan:   Follow up in one week.  

## 2018-06-30 ENCOUNTER — Other Ambulatory Visit: Payer: Self-pay

## 2018-06-30 ENCOUNTER — Ambulatory Visit (INDEPENDENT_AMBULATORY_CARE_PROVIDER_SITE_OTHER): Payer: Medicare Other | Admitting: Vascular Surgery

## 2018-06-30 ENCOUNTER — Encounter (INDEPENDENT_AMBULATORY_CARE_PROVIDER_SITE_OTHER): Payer: Self-pay | Admitting: Vascular Surgery

## 2018-06-30 VITALS — BP 137/90 | HR 93 | Resp 14 | Ht 73.0 in | Wt 255.0 lb

## 2018-06-30 DIAGNOSIS — L97919 Non-pressure chronic ulcer of unspecified part of right lower leg with unspecified severity: Secondary | ICD-10-CM

## 2018-06-30 DIAGNOSIS — I739 Peripheral vascular disease, unspecified: Secondary | ICD-10-CM | POA: Diagnosis not present

## 2018-06-30 DIAGNOSIS — I83019 Varicose veins of right lower extremity with ulcer of unspecified site: Secondary | ICD-10-CM

## 2018-06-30 DIAGNOSIS — I872 Venous insufficiency (chronic) (peripheral): Secondary | ICD-10-CM | POA: Diagnosis not present

## 2018-06-30 DIAGNOSIS — E782 Mixed hyperlipidemia: Secondary | ICD-10-CM

## 2018-06-30 DIAGNOSIS — I890 Lymphedema, not elsewhere classified: Secondary | ICD-10-CM | POA: Diagnosis not present

## 2018-06-30 DIAGNOSIS — E119 Type 2 diabetes mellitus without complications: Secondary | ICD-10-CM

## 2018-06-30 NOTE — Progress Notes (Signed)
MRN : [id number]  Jeremiah [name]. is a 78 y.o. (December 31, 1940) male who presents with chief complaint of  Chief Complaint  Patient presents with  . Follow-up    UNNA boot 4 wk check  .  History of Present Illness:   Patient is seen for follow up evaluation of leg pain and swelling associated with venous ulceration. The patient was recently seen here and started on Unna boot therapy.  The swelling abruptly became much worse bilaterally and is associated with pain and discoloration. The pain and swelling worsens with prolonged dependency and improves with elevation.  The patient notes that in the morning the legs are better but the leg symptoms worsened throughout the course of the day. The patient has also noted a progressive worsening of the discoloration in the ankle and shin area.   The patient notes that an ulcer has developed a while back without specific trauma and since it occurred it has been very slow to heal.  Today he denies pain in his legs.  He denies drainage.  The patient notes that he has tolerated the Unna boots thus far.  The patient states that they have been elevating as much as possible. He has compression sock at home and he has been using his lymph pump twice per day.  The patient denies any recent changes in medications.  The patient denies a history of DVT or PE. There is no prior history of phlebitis. There is no history of primary lymphedema.  No SOB or increased cough.  No sputum production.  No recent episodes of CHF exacerbation.  ABI shows mild [place] but triphasic waveforms and normal TBI  Venous scan shows deep venous insufficiency, no GSV identified   Current Meds  Medication Sig  . acetaminophen (TYLENOL) 325 MG tablet Take 2 tablets (650 mg total) by mouth every 6 (six) hours as needed for mild pain (or Fever >/= 101).  Marland Kitchen aspirin 81 MG chewable tablet Chew 81 mg by mouth daily.  . B Complex-C (B-COMPLEX WITH VITAMIN C) tablet Take 1 tablet by mouth  daily.  . Cinnamon 500 MG capsule Take 500 mg by mouth 2 (two) times daily.  Marland Kitchen etanercept (ENBREL SURECLICK) 50 MG/ML injection Inject 50 mg into the skin every Friday.   . folic acid (FOLVITE) 240 MCG tablet Take 800 mcg by mouth daily.  . hydroxychloroquine (PLAQUENIL) 200 MG tablet Take 400 mg by mouth at bedtime.  . lamoTRIgine (LAMICTAL) 200 MG tablet Take 200 mg by mouth 2 (two) times daily.  Marland Kitchen lamoTRIgine (LAMICTAL) 25 MG tablet Take 25 mg by mouth 2 (two) times daily.  . magnesium oxide (MAG-OX) 400 MG tablet Take 400 mg by mouth daily.  . methotrexate (RHEUMATREX) 2.5 MG tablet Take 25 mg by mouth every Monday.   Marland Kitchen omeprazole (PRILOSEC) 40 MG capsule Take 40 mg by mouth daily.   . polyethylene glycol (MIRALAX / GLYCOLAX) packet Take 17 g by mouth daily.  . traZODone (DESYREL) 50 MG tablet Take 50 mg by mouth at bedtime.    Past Medical History:  Diagnosis Date  . Allergy   . Arthritis   . Hypertension   . Rheumatoid arthritis ([place])   . Seizures ([place])   . Sepsis ([place]) 05/23/2015    Past Surgical History:  Procedure Laterality Date  . back surgery five times  04/2016  . CARPAL TUNNEL RELEASE    . HERNIA REPAIR    . JOINT REPLACEMENT    .  knee replacement right    . right hip replacement    . rotator cuff surgery    . SPINE SURGERY      Social History Social History   Tobacco Use  . Smoking status: Former Smoker    Packs/day: 1.00    Years: 20.00    Pack years: 20.00    Types: Cigarettes  . Smokeless tobacco: Never Used  Substance Use Topics  . Alcohol use: No  . Drug use: Not on file    Family History Family History  Problem Relation Age of Onset  . Hypertension Other   . Hypertension Mother   . Dementia Mother   . Dementia Father   . Prostate cancer Neg Hx   . Bladder Cancer Neg Hx   . Kidney cancer Neg Hx     Allergies  Allergen Reactions  . Cefoperazone Anaphylaxis  . Penicillins Anaphylaxis and Other (See Comments)    Has patient had a PCN  reaction causing immediate rash, facial/tongue/throat swelling, SOB or lightheadedness with hypotension: Yes Has patient had a PCN reaction causing severe rash involving mucus membranes or skin necrosis: No Has patient had a PCN reaction that required hospitalization No Has patient had a PCN reaction occurring within the last 10 years: No If all of the above answers are "NO", then may proceed with Cephalosporin use.  . Latex Rash     REVIEW OF SYSTEMS (Negative unless checked)  Constitutional: [] Weight loss  [] Fever  [] Chills Cardiac: [] Chest pain   [] Chest pressure   [] Palpitations   [] Shortness of breath when laying flat   [] Shortness of breath with exertion. Vascular:  [] Pain in legs with walking   [] Pain in legs at rest  [] History of DVT   [] Phlebitis   [x] Swelling in legs   [] Varicose veins   [] Non-healing ulcers Pulmonary:   [] Uses home oxygen   [] Productive cough   [] Hemoptysis   [] Wheeze  [] COPD   [] Asthma Neurologic:  [] Dizziness   [] Seizures   [] History of stroke   [] History of TIA  [] Aphasia   [] Vissual changes   [] Weakness or numbness in arm   [] Weakness or numbness in leg Musculoskeletal:   [] Joint swelling   [x] Joint pain   [x] Low back pain Hematologic:  [] Easy bruising  [] Easy bleeding   [] Hypercoagulable state   [] Anemic Gastrointestinal:  [] Diarrhea   [] Vomiting  [] Gastroesophageal reflux/heartburn   [] Difficulty swallowing. Genitourinary:  [] Chronic kidney disease   [] Difficult urination  [] Frequent urination   [] Blood in urine Skin:  [x] Rashes   [] Ulcers  Psychological:  [] History of anxiety   []  History of major depression.  Physical Examination  Vitals:   06/30/18 0847  BP: 137/90  Pulse: 93  Resp: 14  Weight: 255 lb (115.7 kg)  Height: 6\' 1"  (1.854 m)   Body mass index is 33.64 kg/m. Gen: WD/WN, NAD Head: [place]/AT, No temporalis wasting.  Ear/Nose/Throat: Hearing grossly intact, nares w/o erythema or drainage Eyes: PER, EOMI, sclera nonicteric.  Neck: Supple,  no large masses.   Pulmonary:  Good air movement, no audible wheezing bilaterally, no use of accessory muscles.  Cardiac: RRR, no JVD Vascular: scattered varicosities present bilaterally.  severe venous stasis changes to the legs bilaterally.  3+ soft pitting edema.  Right leg ulcer is now healed Vessel Right Left  Radial Palpable Palpable  PT Trace Palpable Not Palpable  DP Trace Palpable Trace Palpable  Gastrointestinal: Non-distended. No guarding/no peritoneal signs.  Musculoskeletal: M/S 5/5 throughout.  No deformity or atrophy.  Neurologic:  CN 2-12 intact. Symmetrical.  Speech is fluent. Motor exam as listed above. Psychiatric: Judgment intact, Mood & affect appropriate for pt's clinical situation. Dermatologic: severe venous rashes helaed ulcer scar now noted.  No changes consistent with cellulitis. Lymph : No lichenification or skin changes of chronic lymphedema.  CBC Lab Results  Component Value Date   WBC 5.1 02/21/2018   HGB 11.1 (L) 02/21/2018   HCT 32.0 (L) 02/21/2018   MCV 87.1 02/21/2018   PLT 205 02/21/2018    BMET    Component Value Date/Time   NA 136 02/21/2018 0634   NA 133 (L) 01/14/2017 1048   K 3.4 (L) 02/21/2018 0634   CL 101 02/21/2018 0634   CO2 28 02/21/2018 0634   GLUCOSE 92 02/21/2018 0634   BUN 12 02/21/2018 0634   BUN 11 01/14/2017 1048   CREATININE 0.60 (L) 02/21/2018 0634   CALCIUM 8.2 (L) 02/21/2018 0634   GFRNONAA >60 02/21/2018 0634   GFRAA >60 02/21/2018 0634   CrCl cannot be calculated (Patient's most recent lab result is older than the maximum 21 days allowed.).  COAG No results found for: INR, PROTIME  Radiology Vas [place] Abi With/wo Tbi  Result Date: 06/26/2018 LOWER EXTREMITY DOPPLER STUDY Indications: Peripheral artery disease.  Performing Technologist: Charlane Ferretti RT (R)(VS)  Examination Guidelines: A complete evaluation includes at minimum, Doppler waveform signals and systolic blood pressure reading at the level of bilateral  brachial, anterior tibial, and posterior tibial arteries, when vessel segments are accessible. Bilateral testing is considered an integral part of a complete examination. Photoelectric Plethysmograph (PPG) waveforms and toe systolic pressure readings are included as required and additional duplex testing as needed. Limited examinations for reoccurring indications may be performed as noted.  ABI Findings: +---------+------------------+-----+---------+---------+ Right    Rt Pressure (mmHg)IndexWaveform Comment   +---------+------------------+-----+---------+---------+ Brachial 129                                       +---------+------------------+-----+---------+---------+ ATA      182               1.41 triphasic          +---------+------------------+-----+---------+---------+ PTA      189               1.47 biphasic Hyperemic +---------+------------------+-----+---------+---------+ Great Toe127               0.98 Normal             +---------+------------------+-----+---------+---------+ +---------+------------------+-----+----------+---------+ Left     Lt Pressure (mmHg)IndexWaveform  Comment   +---------+------------------+-----+----------+---------+ Brachial 128                                        +---------+------------------+-----+----------+---------+ ATA      176               1.36 biphasic  Hupe[place]ic +---------+------------------+-----+----------+---------+ PTA      188               1.46 monophasicHyoeremic +---------+------------------+-----+----------+---------+ Great Toe120               0.93                     +---------+------------------+-----+----------+---------+ +-------+-----------+-----------+------------+------------+ ABI/TBIToday's ABIToday's TBIPrevious ABIPrevious TBI +-------+-----------+-----------+------------+------------+ Right  1.47       .  98                                  +-------+-----------+-----------+------------+------------+ Left   1.46       .93                                 +-------+-----------+-----------+------------+------------+ Summary: Right: Resting right ankle-brachial index indicates noncompressible right lower extremity arteries.The right toe-brachial index is normal. Left: Resting left ankle-brachial index indicates noncompressible left lower extremity arteries.The left toe-brachial index is normal.  *See table(s) above for measurements and observations.  Electronically signed by Hortencia Pilar MD on 06/26/2018 at 2:33:57 PM.   Final    Vas [place] Lower Extremity Venous Reflux  Result Date: 06/26/2018  Lower Venous Reflux Study Risk Factors: Surgery Patient states he had bilateral vein stripping many years ago. Performing Technologist: Charlane Ferretti RT (R)(VS)  Examination Guidelines: A complete evaluation includes B-mode imaging, spectral Doppler, color Doppler, and power Doppler as needed of all accessible portions of each vessel. Bilateral testing is considered an integral part of a complete examination. Limited examinations for reoccurring indications may be performed as noted. The reflux portion of the exam is performed with the patient in reverse Trendelenburg.  Right Venous Findings: +---------+---------------+---------+-----------+----------+--------------+          CompressibilityPhasicitySpontaneityPropertiesSummary        +---------+---------------+---------+-----------+----------+--------------+ CFV      Full                                                        +---------+---------------+---------+-----------+----------+--------------+ SFJ      None                                                        +---------+---------------+---------+-----------+----------+--------------+ FV Prox  Full                                                         +---------+---------------+---------+-----------+----------+--------------+ FV Mid   Full                                                        +---------+---------------+---------+-----------+----------+--------------+ FV DistalFull                                                        +---------+---------------+---------+-----------+----------+--------------+ PFV      Full                                                        +---------+---------------+---------+-----------+----------+--------------+  POP      Full                                                        +---------+---------------+---------+-----------+----------+--------------+ GSV                                                   Not visualized +---------+---------------+---------+-----------+----------+--------------+ SSV      Full                                                        +---------+---------------+---------+-----------+----------+--------------+ Left Venous Findings: +---------+---------------+---------+-----------+----------+--------------+          CompressibilityPhasicitySpontaneityPropertiesSummary        +---------+---------------+---------+-----------+----------+--------------+ CFV      Full                                                        +---------+---------------+---------+-----------+----------+--------------+ SFJ      None                                                        +---------+---------------+---------+-----------+----------+--------------+ FV Prox  Full                                                        +---------+---------------+---------+-----------+----------+--------------+ FV Mid   Full                                                        +---------+---------------+---------+-----------+----------+--------------+ FV DistalFull                                                         +---------+---------------+---------+-----------+----------+--------------+ PFV      Full                                                        +---------+---------------+---------+-----------+----------+--------------+ POP      Full                                                        +---------+---------------+---------+-----------+----------+--------------+  GSV                                                   Not visualized +---------+---------------+---------+-----------+----------+--------------+ SSV      Full                                                        +---------+---------------+---------+-----------+----------+--------------+ Vein Diameters: +-------------+----------+---------+              Right (cm)Left (cm) +-------------+----------+---------+ SSV prox               .43       +-------------+----------+---------+ Varicose Vein.34       .23       +-------------+----------+---------+  Right Reflux Technical Findings: Reflux greater than 5100ms in the CFV and popliteal veins. Left Reflux Technical Findings: Reflux greater than 528ms in the CFV, femoral vein and SSV.  Summary: Right: There is no evidence of deep vein thrombosis in the lower extremity.There is no evidence of superficial venous thrombosis. Left: There is no evidence of deep vein thrombosis in the lower extremity.There is no evidence of superficial venous thrombosis. Bilateral greater saphenous veins were not visualized.  *See table(s) above for measurements and observations. Electronically signed by Hortencia Pilar MD on 06/26/2018 at 2:33:59 PM.    Final      Assessment/Plan 1. Chronic venous insufficiency  No surgery or intervention at this point in time.    I have reviewed my discussion with the patient regarding lymphedema and why it  causes symptoms.  Patient will continue wearing graduated compression stockings class 1 (20-30 mmHg) on a daily basis a prescription was given. The  patient is reminded to put the stockings on first thing in the morning and removing them in the evening. The patient is instructed specifically not to sleep in the stockings.   In addition, behavioral modification throughout the day will be continued.  This will include frequent elevation (such as in a recliner), use of over the counter pain medications as needed and exercise such as walking.  I have reviewed systemic causes for chronic edema such as liver, kidney and cardiac etiologies and there does not appear to be any significant changes in these organ systems over the past year.  The patient is under the impression that these organ systems are all stable and unchanged.    The patient will continue aggressive use of the  lymph pump.  This will continue to improve the edema control and prevent sequela such as ulcers and infections.   The patient will follow-up with me on an annual basis.    2. Venous stasis ulcer of right lower extremity (HCC) Now healed DC unna wraps begin using compression stockings and continue lymph pump  3. PAD (peripheral artery disease) (HCC)  Recommend:  The patient has evidence of atherosclerosis of the lower extremities with claudication.  The patient does not voice lifestyle limiting changes at this point in time.  Noninvasive studies do not suggest clinically significant change.  No invasive studies, angiography or surgery at this time The patient should continue walking and begin a more formal exercise program.  The patient should continue antiplatelet therapy and  aggressive treatment of the lipid abnormalities  No changes in the patient's medications at this time  The patient should continue wearing graduated compression socks 10-15 mmHg strength to control the mild edema.    4. Lymphedema  No surgery or intervention at this point in time.    I have reviewed my discussion with the patient regarding lymphedema and why it  causes symptoms.  Patient  will continue wearing graduated compression stockings class 1 (20-30 mmHg) on a daily basis a prescription was given. The patient is reminded to put the stockings on first thing in the morning and removing them in the evening. The patient is instructed specifically not to sleep in the stockings.   In addition, behavioral modification throughout the day will be continued.  This will include frequent elevation (such as in a recliner), use of over the counter pain medications as needed and exercise such as walking.  I have reviewed systemic causes for chronic edema such as liver, kidney and cardiac etiologies and there does not appear to be any significant changes in these organ systems over the past year.  The patient is under the impression that these organ systems are all stable and unchanged.    The patient will continue aggressive use of the  lymph pump.  This will continue to improve the edema control and prevent sequela such as ulcers and infections.   The patient will follow-up with me on an annual basis.    5. Mixed hyperlipidemia Continue statin as ordered and reviewed, no changes at this time   6. Type 2 diabetes mellitus without complication, unspecified whether long term insulin use (HCC) Continue hypoglycemic medications as already ordered, these medications have been reviewed and there are no changes at this time.  Hgb A1C to be monitored as already arranged by primary service     Hortencia Pilar, MD  06/30/2018 8:53 AM

## 2018-07-14 ENCOUNTER — Ambulatory Visit: Payer: No Typology Code available for payment source | Attending: Nurse Practitioner | Admitting: Nurse Practitioner

## 2018-07-14 ENCOUNTER — Other Ambulatory Visit: Payer: Self-pay

## 2018-07-14 ENCOUNTER — Encounter: Payer: Self-pay | Admitting: Nurse Practitioner

## 2018-07-14 VITALS — BP 142/79 | HR 86 | Temp 97.9°F | Ht 72.0 in | Wt 255.0 lb

## 2018-07-14 DIAGNOSIS — M792 Neuralgia and neuritis, unspecified: Secondary | ICD-10-CM | POA: Diagnosis not present

## 2018-07-14 DIAGNOSIS — G894 Chronic pain syndrome: Secondary | ICD-10-CM | POA: Insufficient documentation

## 2018-07-14 DIAGNOSIS — M7918 Myalgia, other site: Secondary | ICD-10-CM | POA: Diagnosis present

## 2018-07-14 DIAGNOSIS — M5416 Radiculopathy, lumbar region: Secondary | ICD-10-CM | POA: Insufficient documentation

## 2018-07-14 DIAGNOSIS — M47817 Spondylosis without myelopathy or radiculopathy, lumbosacral region: Secondary | ICD-10-CM | POA: Insufficient documentation

## 2018-07-14 MED ORDER — GABAPENTIN 300 MG PO CAPS: 300.0000 mg | ORAL_CAPSULE | Freq: Four times a day (QID) | ORAL | 1 refills | Status: DC

## 2018-07-14 NOTE — Progress Notes (Signed)
Patient's Name: Jeremiah [name].  MRN: [id number]  Referring Provider: Derinda Late, MD  DOB: 1941/03/30  PCP: Derinda Late, MD  DOS: 07/14/2018  Note by: Dionisio David, NP  Service setting: Ambulatory outpatient  Specialty: Interventional Pain Management  Location: [facility] (AMB) Pain [facility]    Patient type: Established   HPI  Reason for Visit: Jeremiah [name]. is a 78 y.o. year old, male patient, who comes today with a chief complaint of No chief complaint on file. Last Appointment: His last appointment at our practice was on 06/08/2018. I last saw him on Visit date not found.  Pain Assessment: Today, [name] describes the severity of the Chronic pain as a 7 /10. He indicates the location/referral of the pain to be Back Right, Left, Lower/pain radiaties down left hip and leg. Onset was: More than a month ago. The quality of pain is described as Aching, Burning. Temporal description, or timing of pain is: Constant. Possible modifying factors: medications. Jeremiah [name]  height is 6' (1.829 m) and weight is 255 lb (115.7 kg). His temperature is 97.9 F (36.6 C). His blood pressure is 142/79 (abnormal) and his pulse is 86. His oxygen saturation is 100%. He has pain of a 3/4 out of 10 daily. He pain is constant. He has had several back surgeries. He admits that he has had trigger point injections in the past which were effective. He has failed lumbar facet injections and ESI.    ROS  Constitutional: Denies any fever or chills Gastrointestinal: No reported hemesis, hematochezia, vomiting, or acute GI distress Musculoskeletal: Denies any acute onset joint swelling, redness, loss of ROM, or weakness Neurological: No reported episodes of acute onset apraxia, aphasia, dysarthria, agnosia, amnesia, paralysis, loss of coordination, or loss of consciousness  Medication Review  B-complex with vitamin C, Cinnamon, acetaminophen, aspirin, etanercept, folic acid, gabapentin,  hydroxychloroquine, insulin aspart, lamoTRIgine, magnesium oxide, methotrexate, omeprazole, polyethylene glycol, and traZODone  History Review  Allergy: Jeremiah [name] is allergic to cefoperazone; penicillins; and latex. Drug: Jeremiah [name]  has no history on file for drug. Alcohol:  reports no history of alcohol use. Tobacco:  reports that he has quit smoking. His smoking use included cigarettes. He has a 20.00 pack-year smoking history. He has never used smokeless tobacco. Social: Jeremiah [name]  reports that he has quit smoking. His smoking use included cigarettes. He has a 20.00 pack-year smoking history. He has never used smokeless tobacco. He reports that he does not drink alcohol. Medical:  has a past medical history of Allergy, Arthritis, Hypertension, Rheumatoid arthritis ([place]), Seizures ([place]), and Sepsis ([place]) (05/23/2015). Surgical: Jeremiah [name]  has a past surgical history that includes back surgery five times (04/2016); right hip replacement; knee replacement right; Hernia repair; Spine surgery; Joint replacement; rotator cuff surgery; and Carpal tunnel release. Family: family history includes Dementia in his father and mother; Hypertension in his mother and another family member. 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  Lab Review  Kidney Function Lab Results  Component Value Date   BUN 12 02/21/2018   CREATININE 0.60 (L) 02/21/2018   BCR 14 01/14/2017   GFRAA >60 02/21/2018   GFRNONAA >60 02/21/2018  Liver Function Lab Results  Component Value Date   AST 18 02/20/2018   ALT 13 02/20/2018  ALBUMIN 3.7 02/20/2018  Note: Above Lab results reviewed.  Imaging Review  VAS [place] LOWER EXTREMITY VENOUS REFLUX  Lower Venous Reflux Study  Risk Factors: Surgery Patient  states he had bilateral vein stripping many years ago. Performing Technologist: Charlane Ferretti RT (R)(VS)    Examination Guidelines: A complete evaluation includes B-mode imaging, spectral Doppler, color Doppler, and power Doppler as needed of all accessible portions of each vessel. Bilateral testing is considered an integral part of a complete examination. Limited examinations for reoccurring indications may be performed as noted. The reflux portion of the exam is performed with the patient in reverse Trendelenburg.    Right Venous Findings: +---------+---------------+---------+-----------+----------+--------------+          CompressibilityPhasicitySpontaneityPropertiesSummary        +---------+---------------+---------+-----------+----------+--------------+ CFV      Full                                                        +---------+---------------+---------+-----------+----------+--------------+ SFJ      None                                                        +---------+---------------+---------+-----------+----------+--------------+ FV Prox  Full                                                        +---------+---------------+---------+-----------+----------+--------------+ FV Mid   Full                                                        +---------+---------------+---------+-----------+----------+--------------+ FV DistalFull                                                        +---------+---------------+---------+-----------+----------+--------------+ PFV      Full                                                        +---------+---------------+---------+-----------+----------+--------------+ POP      Full                                                        +---------+---------------+---------+-----------+----------+--------------+ GSV  Not  visualized +---------+---------------+---------+-----------+----------+--------------+ SSV      Full                                                        +---------+---------------+---------+-----------+----------+--------------+  Left Venous Findings: +---------+---------------+---------+-----------+----------+--------------+          CompressibilityPhasicitySpontaneityPropertiesSummary        +---------+---------------+---------+-----------+----------+--------------+ CFV      Full                                                        +---------+---------------+---------+-----------+----------+--------------+ SFJ      None                                                        +---------+---------------+---------+-----------+----------+--------------+ FV Prox  Full                                                        +---------+---------------+---------+-----------+----------+--------------+ FV Mid   Full                                                        +---------+---------------+---------+-----------+----------+--------------+ FV DistalFull                                                        +---------+---------------+---------+-----------+----------+--------------+ PFV      Full                                                        +---------+---------------+---------+-----------+----------+--------------+ POP      Full                                                        +---------+---------------+---------+-----------+----------+--------------+ GSV                                                   Not visualized +---------+---------------+---------+-----------+----------+--------------+ SSV      Full                                                        +---------+---------------+---------+-----------+----------+--------------+  Vein Diameters: +-------------+----------+---------+               Right (cm)Left (cm) +-------------+----------+---------+ SSV prox               .43       +-------------+----------+---------+ Varicose Vein.34       .23       +-------------+----------+---------+    Right Reflux Technical Findings: Reflux greater than 546ms in the CFV and popliteal veins. Left Reflux Technical Findings: Reflux greater than 550ms in the CFV, femoral vein and SSV.   Summary: Right: There is no evidence of deep vein thrombosis in the lower extremity.There is no evidence of superficial venous thrombosis. Left: There is no evidence of deep vein thrombosis in the lower extremity.There is no evidence of superficial venous thrombosis. Bilateral greater saphenous veins were not visualized.   *See table(s) above for measurements and observations.  Electronically signed by Hortencia Pilar MD on 06/26/2018 at 2:33:59 PM.      Final   VAS [place] ABI WITH/WO TBI LOWER EXTREMITY DOPPLER STUDY  Indications: Peripheral artery disease.   Performing Technologist: Charlane Ferretti RT (R)(VS)    Examination Guidelines: A complete evaluation includes at minimum, Doppler waveform signals and systolic blood pressure reading at the level of bilateral brachial, anterior tibial, and posterior tibial arteries, when vessel segments are accessible. Bilateral testing is considered an integral part of a complete examination. Photoelectric Plethysmograph (PPG) waveforms and toe systolic pressure readings are included as required and additional duplex testing as needed. Limited examinations for reoccurring indications may be performed as noted.    ABI Findings: +---------+------------------+-----+---------+---------+ Right    Rt Pressure (mmHg)IndexWaveform Comment   +---------+------------------+-----+---------+---------+ Brachial 129                                       +---------+------------------+-----+---------+---------+ ATA      182               1.41  triphasic          +---------+------------------+-----+---------+---------+ PTA      189               1.47 biphasic Hyperemic +---------+------------------+-----+---------+---------+ Great Toe127               0.98 Normal             +---------+------------------+-----+---------+---------+  +---------+------------------+-----+----------+---------+ Left     Lt Pressure (mmHg)IndexWaveform  Comment   +---------+------------------+-----+----------+---------+ Brachial 128                                        +---------+------------------+-----+----------+---------+ ATA      176               1.36 biphasic  Huperemic +---------+------------------+-----+----------+---------+ PTA      188               1.46 monophasicHyoeremic +---------+------------------+-----+----------+---------+ Great Toe120               0.93                     +---------+------------------+-----+----------+---------+  +-------+-----------+-----------+------------+------------+ ABI/TBIToday's ABIToday's TBIPrevious ABIPrevious TBI +-------+-----------+-----------+------------+------------+ Right  1.47       .98                                 +-------+-----------+-----------+------------+------------+  Left   1.46       .93                                 +-------+-----------+-----------+------------+------------+  Summary: Right: Resting right ankle-brachial index indicates noncompressible right lower extremity arteries.The right toe-brachial index is normal.  Left: Resting left ankle-brachial index indicates noncompressible left lower extremity arteries.The left toe-brachial index is normal.    *See table(s) above for measurements and observations.    Electronically signed by Hortencia Pilar MD on 06/26/2018 at 2:33:57 PM.    Final   Note: Reviewed        Physical Exam  General appearance: Well nourished, well developed, and well hydrated. In  no apparent acute distress Mental status: Alert, oriented x 3 (person, place, & time)       Respiratory: No evidence of acute respiratory distress Eyes: PERLA Vitals: BP (!) 142/79   Pulse 86   Temp 97.9 F (36.6 C)   Ht 6' (1.829 m)   Wt 255 lb (115.7 kg)   SpO2 100%   BMI 34.58 kg/m  BMI: Estimated body mass index is 34.58 kg/m as calculated from the following:   Height as of this encounter: 6' (1.829 m).   Weight as of this encounter: 255 lb (115.7 kg). Ideal: Ideal body weight: 77.6 kg (171 lb 1.2 oz) Adjusted ideal body weight: 92.8 kg (204 lb 10.3 oz) Lumbar Spine Area Exam  Skin & Axial Inspection: No masses, redness, or swelling Alignment: Symmetrical Functional ROM: Unrestricted ROM       Stability: No instability detected Muscle Tone/Strength: Functionally intact. No obvious neuro-muscular anomalies detected. Sensory (Neurological): Unimpaired Palpation: Complains of area being tender to palpation        Gait & Posture Assessment  Ambulation: Patient came in today in a wheel chair Gait: Relatively normal for age and body habitus Posture: WNL  Lower Extremity Exam    Side: Right lower extremity  Side: Left lower extremity  Stability: No instability observed          Stability: No instability observed          Skin & Extremity Inspection: Edema and normalized erythema  Skin & Extremity Inspection: Edema and generalized erythema  Sensory (Neurological): Neuropathic pain pattern        Sensory (Neurological): Neuropathic pain pattern         Assessment   Status Diagnosis  Persistent Persistent Persistent 1. Lumbar radiculopathy   2. Spondylosis without myelopathy or radiculopathy, lumbosacral region   3. Neurogenic pain   4. Myofascial pain syndrome   5. Chronic pain syndrome      Updated Problems: No problems updated.  Plan of Care  Pharmacotherapy (Medications Ordered): Meds ordered this encounter  Medications  . gabapentin (NEURONTIN) 300 MG capsule     Sig: Take 1 capsule (300 mg total) by mouth 4 (four) times daily. QHS    Dispense:  360 capsule    Refill:  1    Do not place this medication, or any other prescription from our practice, on "Automatic Refill". Patient may have prescription filled one day early if pharmacy is closed on scheduled refill date.    Order Specific Question:   Supervising Provider    Answer:   Milinda Pointer [phone]   Administered today: Robyne Askew. Rondel Jumbo. had no medications administered during this visit.  Orders:  Orders Placed This Encounter  Procedures  .  TRIGGER POINT INJECTION    Standing Status:   Future    Standing Expiration Date:   08/13/2018    Scheduling Instructions:     Area: Lower Back     Side: Bilateral     Sedation: None     Timeframe: ASAA    Order Specific Question:   Where will this procedure be performed?    Answer:   [facility]  Interventional management options: Planned, scheduled, and/or pending:   Diagnostic/palliativetrigger point injectionsof the lumbosacral spine   Considering:   Diagnostic/palliativetrigger point injectionsof the lumbosacral and thoracolumbar spine Diagnostic bilateral lumbar facet block Possible bilateral lumbar facet RFA Diagnostic caudal epidural steroid injection + diagnostic epidurogram Possible Racz procedure Diagnostic thoracic epidural steroid injection Diagnostic right-sided L3-4 and L4-5 transforaminal epidural steroid injection Diagnostic bilateral L1-2 transforaminal epidural steroid injection PossibleSCStrial   Palliative PRN treatment(s):   Diagnostic/palliativetrigger point injectionsof the lumbosacral and thoracolumbar spine Diagnostic bilateral lumbar facet block #1under fluoroscopy and IV sedation.     Note by: Dionisio David, NP Date: 07/14/2018; Time: 10:02 AM

## 2018-07-14 NOTE — Patient Instructions (Addendum)
____________________________________________________________________________________________  Appointment Policy Summary  It is our goal and responsibility to provide the medical community with assistance in the evaluation and management of patients with chronic pain. Unfortunately our resources are limited. Because we do not have an unlimited amount of time, or available appointments, we are required to closely monitor and manage their use. The following rules exist to maximize their use:  Patient's responsibilities: 1. Punctuality:  At what time should I arrive? You should be physically present in our office 30 minutes before your scheduled appointment. Your scheduled appointment is with your assigned healthcare provider. However, it takes 5-10 minutes to be "checked-in", and another 15 minutes for the nurses to do the admission. If you arrive to our office at the time you were given for your appointment, you will end up being at least 20-25 minutes late to your appointment with the provider. 2. Tardiness:  What happens if I arrive only a few minutes after my scheduled appointment time? You will need to reschedule your appointment. The cutoff is your appointment time. This is why it is so important that you arrive at least 30 minutes before that appointment. If you have an appointment scheduled for 10:00 AM and you arrive at 10:01, you will be required to reschedule your appointment.  3. Plan ahead:  Always assume that you will encounter traffic on your way in. Plan for it. If you are dependent on a driver, make sure they understand these rules and the need to arrive early. 4. Other appointments and responsibilities:  Avoid scheduling any other appointments before or after your pain clinic appointments.  5. Be prepared:  Write down everything that you need to discuss with your healthcare provider and give this information to the admitting nurse. Write down the medications that you will need  refilled. Bring your pills and bottles (even the empty ones), to all of your appointments, except for those where a procedure is scheduled. 6. No children or pets:  Find someone to take care of them. It is not appropriate to bring them in. 7. Scheduling changes:  We request "advanced notification" of any changes or cancellations. 8. Advanced notification:  Defined as a time period of more than 24 hours prior to the originally scheduled appointment. This allows for the appointment to be offered to other patients. 9. Rescheduling:  When a visit is rescheduled, it will require the cancellation of the original appointment. For this reason they both fall within the category of "Cancellations".  10. Cancellations:  They require advanced notification. Any cancellation less than 24 hours before the  appointment will be recorded as a "No Show". 11. No Show:  Defined as an unkept appointment where the patient failed to notify or declare to the practice their intention or inability to keep the appointment.  Corrective process for repeat offenders:  1. Tardiness: Three (3) episodes of rescheduling due to late arrivals will be recorded as one (1) "No Show". 2. Cancellation or reschedule: Three (3) cancellations or rescheduling will be recorded as one (1) "No Show". 3. "No Shows": Three (3) "No Shows" within a 12 month period will result in discharge from the practice. ____________________________________________________________________________________________ Preparing for your procedure (without sedation) Instructions: . Oral Intake: Do not eat or drink anything for at least 3 hours prior to your procedure. . Transportation: Unless otherwise stated by your physician, you may drive yourself after the procedure. . Blood Pressure Medicine: Take your blood pressure medicine with a sip of water the morning of the procedure. Marland Kitchen  Insulin: Take only  of your normal insulin dose. . Preventing infections: Shower  with an antibacterial soap the morning of your procedure. . Build-up your immune system: Take 1000 mg of Vitamin C with every meal (3 times a day) the day prior to your procedure. . Pregnancy: If you are pregnant, call and cancel the procedure. . Sickness: If you have a cold, fever, or any active infections, call and cancel the procedure. . Arrival: You must be in the facility at least 30 minutes prior to your scheduled procedure. . Children: Do not bring any children with you. . Dress appropriately: Bring dark clothing that you would not mind if they get stained. . Valuables: Do not bring any jewelry or valuables. Procedure appointments are reserved for interventional treatments only. Marland Kitchen No Prescription Refills. . No medication changes will be discussed during procedure appointments. . No disability issues will be discussed.

## 2018-07-22 ENCOUNTER — Other Ambulatory Visit: Payer: Self-pay

## 2018-07-22 ENCOUNTER — Encounter: Payer: Self-pay | Admitting: Pain Medicine

## 2018-07-22 ENCOUNTER — Ambulatory Visit: Payer: No Typology Code available for payment source | Attending: Nurse Practitioner | Admitting: Pain Medicine

## 2018-07-22 VITALS — BP 118/83 | HR 82 | Temp 98.0°F | Resp 16 | Ht 72.0 in | Wt 245.0 lb

## 2018-07-22 DIAGNOSIS — Z91040 Latex allergy status: Secondary | ICD-10-CM | POA: Diagnosis present

## 2018-07-22 DIAGNOSIS — M7918 Myalgia, other site: Secondary | ICD-10-CM | POA: Insufficient documentation

## 2018-07-22 DIAGNOSIS — G8929 Other chronic pain: Secondary | ICD-10-CM | POA: Diagnosis present

## 2018-07-22 DIAGNOSIS — M549 Dorsalgia, unspecified: Secondary | ICD-10-CM | POA: Diagnosis present

## 2018-07-22 DIAGNOSIS — M5442 Lumbago with sciatica, left side: Secondary | ICD-10-CM | POA: Diagnosis present

## 2018-07-22 DIAGNOSIS — M5441 Lumbago with sciatica, right side: Secondary | ICD-10-CM | POA: Diagnosis present

## 2018-07-22 DIAGNOSIS — M961 Postlaminectomy syndrome, not elsewhere classified: Secondary | ICD-10-CM | POA: Diagnosis present

## 2018-07-22 MED ORDER — ROPIVACAINE HCL 2 MG/ML IJ SOLN
INTRAMUSCULAR | Status: AC
  Filled 2018-07-22: qty 10

## 2018-07-22 MED ORDER — ROPIVACAINE HCL 2 MG/ML IJ SOLN
9.0000 mL | Freq: Once | INTRAMUSCULAR | Status: AC
  Administered 2018-07-22: 10 mL

## 2018-07-22 MED ORDER — TRIAMCINOLONE ACETONIDE 40 MG/ML IJ SUSP
INTRAMUSCULAR | Status: AC
  Filled 2018-07-22: qty 1

## 2018-07-22 MED ORDER — TRIAMCINOLONE ACETONIDE 40 MG/ML IJ SUSP
40.0000 mg | Freq: Once | INTRAMUSCULAR | Status: AC
  Administered 2018-07-22: 40 mg

## 2018-07-22 NOTE — Progress Notes (Signed)
Safety precautions to be maintained throughout the outpatient stay will include: orient to surroundings, keep bed in low position, maintain call bell within reach at all times, provide assistance with transfer out of bed and ambulation.  

## 2018-07-22 NOTE — Patient Instructions (Addendum)

## 2018-07-22 NOTE — Progress Notes (Signed)
Patient's Name: Jeremiah [name].  MRN: [id number]  Referring Provider: Vevelyn Francois, NP  DOB: 13-Aug-1940  PCP: Derinda Late, MD  DOS: 07/22/2018  Note by: Gaspar Cola, MD  Service setting: Ambulatory outpatient  Specialty: Interventional Pain Management  Patient type: Established  Location: [facility] (AMB) Pain Management Facility  Visit type: Interventional Procedure   Primary Reason for Visit: Interventional Pain Management Treatment. CC: Back Pain  Procedure:          Anesthesia, Analgesia, Anxiolysis:  Type: Trigger Point Injection (1-2 muscle groups) #1  CPT: 20552 Primary Purpose: Palliative Region: Posterior Lumbosacral Level: PSIS Target Area: Trigger Point Approach: Percutaneous, ipsilateral approach. Laterality: Bilateral        Type: Local Anesthesia Indication(s): Analgesia         Local Anesthetic: Lidocaine 1-2% Route: Infiltration ([place]/IM) IV Access: Declined Sedation: Declined   Position: Sitting   Indications: 1. Myofascial pain syndrome   2. Trigger point with back pain (recurrent/intermittent)   3. Chronic low back pain (Primary Area of Pain) (Bilateral) (R>L)   4. Dorsalgia   5. Failed back surgical syndrome x 5    Pain Score: Pre-procedure: 6 /10 Post-procedure: 5 /10  Pre-op Assessment:  Jeremiah [name] is a 78 y.o. (year old), male patient, seen today for interventional treatment. He  has a past surgical history that includes back surgery five times (04/2016); right hip replacement; knee replacement right; Hernia repair; Spine surgery; Joint replacement; rotator cuff surgery; and Carpal tunnel release. Jeremiah [name] has a current medication list which includes the following prescription(s): acetaminophen, aspirin, b-complex with vitamin c, cinnamon, etanercept, folic acid, gabapentin, hydroxychloroquine, insulin aspart, insulin aspart, lamotrigine, lamotrigine, magnesium oxide, methotrexate, omeprazole, polyethylene glycol, and trazodone. His primarily  concern today is the Back Pain  Initial Vital Signs:  Pulse/HCG Rate: 84  Temp: 98 F (36.7 C) Resp: 18 BP: 136/75 SpO2: 100 %  BMI: Estimated body mass index is 33.23 kg/m as calculated from the following:   Height as of this encounter: 6' (1.829 m).   Weight as of this encounter: 245 lb (111.1 kg).  Risk Assessment: Allergies: Reviewed. He is allergic to cefoperazone; penicillins; and latex.  Allergy Precautions: Latex-free protocol activated Coagulopathies: Reviewed. None identified.  Blood-thinner therapy: None at this time Active Infection(s): Reviewed. None identified. Jeremiah [name] is afebrile  Site Confirmation: Jeremiah [name] was asked to confirm the procedure and laterality before marking the site Procedure checklist: Completed Consent: Before the procedure and under the influence of no sedative(s), amnesic(s), or anxiolytics, the patient was informed of the treatment options, risks and possible complications. To fulfill our ethical and legal obligations, as recommended by the American Medical Association's Code of Ethics, I have informed the patient of my clinical impression; the nature and purpose of the treatment or procedure; the risks, benefits, and possible complications of the intervention; the alternatives, including doing nothing; the risk(s) and benefit(s) of the alternative treatment(s) or procedure(s); and the risk(s) and benefit(s) of doing nothing. The patient was provided information about the general risks and possible complications associated with the procedure. These may include, but are not limited to: failure to achieve desired goals, infection, bleeding, organ or nerve damage, allergic reactions, paralysis, and death. In addition, the patient was informed of those risks and complications associated to the procedure, such as failure to decrease pain; infection; bleeding; organ or nerve damage with subsequent damage to sensory, motor, and/or autonomic systems, resulting  in permanent pain, numbness, and/or weakness of one or  several areas of the body; allergic reactions; (i.e.: anaphylactic reaction); and/or death. Furthermore, the patient was informed of those risks and complications associated with the medications. These include, but are not limited to: allergic reactions (i.e.: anaphylactic or anaphylactoid reaction(s)); adrenal axis suppression; blood sugar elevation that in diabetics may result in ketoacidosis or comma; water retention that in patients with history of congestive heart failure may result in shortness of breath, pulmonary edema, and decompensation with resultant heart failure; weight gain; swelling or edema; medication-induced neural toxicity; particulate matter embolism and blood vessel occlusion with resultant organ, and/or nervous system infarction; and/or aseptic necrosis of one or more joints. Finally, the patient was informed that Medicine is not an exact science; therefore, there is also the possibility of unforeseen or unpredictable risks and/or possible complications that may result in a catastrophic outcome. The patient indicated having understood very clearly. We have given the patient no guarantees and we have made no promises. Enough time was given to the patient to ask questions, all of which were answered to the patient's satisfaction. Jeremiah [name] has indicated that he wanted to continue with the procedure. Attestation: I, the ordering provider, attest that I have discussed with the patient the benefits, risks, side-effects, alternatives, likelihood of achieving goals, and potential problems during recovery for the procedure that I have provided informed consent. Date  Time: 07/22/2018  1:11 PM  Pre-Procedure Preparation:  Monitoring: As per clinic protocol. Respiration, ETCO2, SpO2, BP, heart rate and rhythm monitor placed and checked for adequate function Safety Precautions: Patient was assessed for positional comfort and pressure points  before starting the procedure. Time-out: I initiated and conducted the "Time-out" before starting the procedure, as per protocol. The patient was asked to participate by confirming the accuracy of the "Time Out" information. Verification of the correct person, site, and procedure were performed and confirmed by me, the nursing staff, and the patient. "Time-out" conducted as per Joint Commission's Universal Protocol (UP.01.01.01). Time: 1330  Description of Procedure:          Area Prepped: Entire Posterior Lumbosacral Region Prepping solution: ChloraPrep (2% chlorhexidine gluconate and 70% isopropyl alcohol) Safety Precautions: Aspiration looking for blood return was conducted prior to all injections. At no point did we inject any substances, as a needle was being advanced. No attempts were made at seeking any paresthesias. Safe injection practices and needle disposal techniques used. Medications properly checked for expiration dates. SDV (single dose vial) medications used. Description of the Procedure: Protocol guidelines were followed. The patient was placed in position over the fluoroscopy table. The target area was identified and the area prepped in the usual manner. Skin & deeper tissues infiltrated with local anesthetic. Appropriate amount of time allowed to pass for local anesthetics to take effect. The procedure needles were then advanced to the target area. Proper needle placement secured. Negative aspiration confirmed. Solution injected in intermittent fashion, asking for systemic symptoms every 0.5cc of injectate. The needles were then removed and the area cleansed, making sure to leave some of the prepping solution back to take advantage of its long term bactericidal properties.  Vitals:   07/22/18 1311 07/22/18 1313 07/22/18 1333  BP:  136/75 118/83  Pulse: 84  82  Resp: 18  16  Temp: 98 F (36.7 C)    SpO2: 100%  98%  Weight: 245 lb (111.1 kg)    Height: 6' (1.829 m)      Start  Time: 1330 hrs. End Time: 1332 hrs. Materials:  Needle(s) Type:  Epidural needle Gauge: 25G Length: 1.5-in Medication(s): Please see orders for medications and dosing details.  Imaging Guidance:          Type of Imaging Technique: None used Indication(s): N/A Exposure Time: No patient exposure Contrast: None used. Fluoroscopic Guidance: N/A Ultrasound Guidance: N/A Interpretation: N/A  Antibiotic Prophylaxis:   Anti-infectives (From admission, onward)   None     Indication(s): None identified  Post-operative Assessment:  Post-procedure Vital Signs:  Pulse/HCG Rate: 82  Temp: 98 F (36.7 C) Resp: 16 BP: 118/83 SpO2: 98 %  EBL: None  Complications: No immediate post-treatment complications observed by team, or reported by patient.  Note: The patient tolerated the entire procedure well. A repeat set of vitals were taken after the procedure and the patient was kept under observation following institutional policy, for this type of procedure. Post-procedural neurological assessment was performed, showing return to baseline, prior to discharge. The patient was provided with post-procedure discharge instructions, including a section on how to identify potential problems. Should any problems arise concerning this procedure, the patient was given instructions to immediately contact us, at any time, without hesitation. In any case, we plan to contact the patient by telephone for a follow-up status report regarding this interventional procedure.  Comments:  No additional relevant information.  Plan of Care   Imaging Orders  No imaging studies ordered today    Procedure Orders     TRIGGER POINT INJECTION  Medications ordered for procedure: Meds ordered this encounter  Medications  . triamcinolone acetonide (KENALOG-40) injection 40 mg  . ropivacaine (PF) 2 mg/mL (0.2%) (NAROPIN) injection 9 mL   Medications administered: We administered triamcinolone acetonide and  ropivacaine (PF) 2 mg/mL (0.2%).  See the medical record for exact dosing, route, and time of administration.  Disposition: Discharge home  Discharge Date & Time: 07/22/2018; 1335 hrs.   Physician-requested Follow-up: Return for PPE (2 wks) w/ Dionisio David, NP.  Future Appointments  Date Time Provider [place]  08/05/2018 11:15 AM Vevelyn Francois, NP [facility] None  10/17/2018 11:15 AM Bernardo Heater, Ronda Fairly, MD [facility] None  01/01/2019 10:15 AM Schnier, Dolores Lory, MD [facility] None  01/05/2019 10:30 AM Vevelyn Francois, NP [facility] None   Primary Care Physician: Derinda Late, MD Location: [facility] Note by: Gaspar Cola, MD Date: 07/22/2018; Time: 1:47 PM  Disclaimer:  Medicine is not an Chief Strategy Officer. The only guarantee in medicine is that nothing is guaranteed. It is important to note that the decision to proceed with this intervention was based on the information collected from the patient. The Data and conclusions were drawn from the patient's questionnaire, the interview, and the physical examination. Because the information was provided in large part by the patient, it cannot be guaranteed that it has not been purposely or unconsciously manipulated. Every effort has been made to obtain as much relevant data as possible for this evaluation. It is important to note that the conclusions that lead to this procedure are derived in large part from the available data. Always take into account that the treatment will also be dependent on availability of resources and existing treatment guidelines, considered by other Pain Management Practitioners as being common knowledge and practice, at the time of the intervention. For Medico-Legal purposes, it is also important to point out that variation in procedural techniques and pharmacological choices are the acceptable norm. The indications, contraindications, technique, and results of the above procedure should only  be interpreted and judged by  a Board-Certified Interventional Pain Specialist with extensive familiarity and expertise in the same exact procedure and technique.

## 2018-07-23 ENCOUNTER — Telehealth: Payer: Self-pay

## 2018-07-23 NOTE — Telephone Encounter (Signed)
Post procedure phone call.  Patient states he is doing good.  

## 2018-08-05 ENCOUNTER — Ambulatory Visit: Payer: Self-pay | Admitting: Nurse Practitioner

## 2018-08-19 DIAGNOSIS — R600 Localized edema: Secondary | ICD-10-CM | POA: Insufficient documentation

## 2018-10-16 ENCOUNTER — Other Ambulatory Visit: Payer: Self-pay

## 2018-10-16 ENCOUNTER — Telehealth (INDEPENDENT_AMBULATORY_CARE_PROVIDER_SITE_OTHER): Payer: Medicare Other | Admitting: Urology

## 2018-10-16 NOTE — Progress Notes (Signed)
Scheduled for telephone follow-up today however got voicemail x2.  Will reschedule

## 2018-10-17 ENCOUNTER — Ambulatory Visit: Payer: Medicare Other | Admitting: Urology

## 2019-01-01 ENCOUNTER — Telehealth: Payer: Self-pay | Admitting: *Deleted

## 2019-01-01 ENCOUNTER — Ambulatory Visit (INDEPENDENT_AMBULATORY_CARE_PROVIDER_SITE_OTHER): Payer: Medicare Other | Admitting: Vascular Surgery

## 2019-01-04 NOTE — Progress Notes (Signed)
Failed attempt at Virtual Visit via Telephone   Patient's Phone No. & Preferred Pharmacy:  [phone] (home); [phone] (mobile); (Preferred) [phone] No e-mail address on record   I attempted to contact Jeremiah [name]. on 01/05/2019 via telephone. I was unable to complete the visit due to no one answering the phone, or unable to get a proper contact number. I was also unable to leave a message.   In reviewing this patient's medical record, I see that he is not interested in any type of interventional therapy secondary to bad experiences with other practitioners doing facet injections.  In addition, he is not receiving any opioids.  The only medication that he is receiving from me is Neurontin 300 mg, 1 tablet p.o. 4 times daily, which could be prescribed by his primary care physician.  There is no need for him to continue coming to the pain clinic for this.  Note by: Gaspar Cola, MD Date: 01/05/2019; Time: 2:01 PM

## 2019-01-05 ENCOUNTER — Other Ambulatory Visit: Payer: Self-pay

## 2019-01-05 ENCOUNTER — Encounter: Payer: Self-pay | Admitting: Nurse Practitioner

## 2019-01-05 ENCOUNTER — Ambulatory Visit: Payer: Worker's Compensation | Attending: Pain Medicine | Admitting: Pain Medicine

## 2019-01-05 DIAGNOSIS — G8929 Other chronic pain: Secondary | ICD-10-CM

## 2019-01-05 DIAGNOSIS — M4310 Spondylolisthesis, site unspecified: Secondary | ICD-10-CM

## 2019-01-05 DIAGNOSIS — M4854XS Collapsed vertebra, not elsewhere classified, thoracic region, sequela of fracture: Secondary | ICD-10-CM

## 2019-01-05 DIAGNOSIS — M79604 Pain in right leg: Secondary | ICD-10-CM

## 2019-01-05 DIAGNOSIS — M4804 Spinal stenosis, thoracic region: Secondary | ICD-10-CM

## 2019-01-05 DIAGNOSIS — M792 Neuralgia and neuritis, unspecified: Secondary | ICD-10-CM

## 2019-01-05 DIAGNOSIS — M5442 Lumbago with sciatica, left side: Secondary | ICD-10-CM

## 2019-01-05 DIAGNOSIS — G894 Chronic pain syndrome: Secondary | ICD-10-CM

## 2019-01-05 MED ORDER — GABAPENTIN 300 MG PO CAPS: 300.0000 mg | ORAL_CAPSULE | Freq: Four times a day (QID) | ORAL | 0 refills | Status: DC

## 2019-02-19 ENCOUNTER — Other Ambulatory Visit: Payer: Self-pay

## 2019-02-19 ENCOUNTER — Ambulatory Visit: Payer: Medicare Other | Admitting: Physician Assistant

## 2019-02-19 ENCOUNTER — Encounter: Payer: Self-pay | Admitting: Physician Assistant

## 2019-02-19 VITALS — BP 158/72 | HR 92 | Ht 72.0 in | Wt 248.0 lb

## 2019-02-19 DIAGNOSIS — L309 Dermatitis, unspecified: Secondary | ICD-10-CM

## 2019-02-19 DIAGNOSIS — R3915 Urgency of urination: Secondary | ICD-10-CM | POA: Diagnosis not present

## 2019-02-19 DIAGNOSIS — Z412 Encounter for routine and ritual male circumcision: Secondary | ICD-10-CM

## 2019-02-19 LAB — MICROSCOPIC EXAMINATION: RBC, Urine: NONE SEEN /hpf (ref 0–2)

## 2019-02-19 LAB — URINALYSIS, COMPLETE
Bilirubin, UA: NEGATIVE
Glucose, UA: NEGATIVE
Nitrite, UA: NEGATIVE
Protein,UA: NEGATIVE
RBC, UA: NEGATIVE
Specific Gravity, UA: 1.02 (ref 1.005–1.030)
Urobilinogen, Ur: 1 mg/dL (ref 0.2–1.0)
pH, UA: 7 (ref 5.0–7.5)

## 2019-02-19 MED ORDER — FLUCONAZOLE 150 MG PO TABS: 150.0000 mg | ORAL_TABLET | Freq: Once | ORAL | 0 refills | Status: AC

## 2019-02-19 NOTE — Progress Notes (Signed)
02/19/2019 4:44 PM   Jeremiah [name] July 24, 1940 [id number]  CC: "Groin and testicle irritation"  HPI: Jeremiah [name]. is a 78 y.o. male who presents today for evaluation of skin irritation of the scrotum and perineum. He is an established [facility] patient who last saw Dr. Bernardo Heater on 03/06/2018 for voiding trial following an episode of acute urinary retention during a hospitalization for pseudogout.  He reports an approximate 3-month history of skin irritation of the scrotum and perineum.  He reports some mild itching and sour odor associated with this.  Additionally, he notes moisture along his right inguinal crease when he wakes up in the morning.    He does report occasional urinary frequency, but states he usually is able to make it to the restroom without any leakage.  He has worn absorbent underwear in the past but stopped this.  He is also curious about circumcision.  He states he does not have any problems with retracting his foreskin, however he is heard that this procedure would make genital hygiene easier.  Also, he wonders if it would ease his urination.  He has a history of rheumatoid arthritis and takes Plaquenil and methotrexate for this.  In-office UA today positive for trace leukocyte esterase; urine microscopy with 6-10 WBCs/HPF and moderate bacteria.  He reports anaphylactic reactions to penicillins and cefoperazone.  PMH: Past Medical History:  Diagnosis Date  . Allergy   . Arthritis   . Hypertension   . Rheumatoid arthritis ([place])   . Seizures ([place])   . Sepsis ([place]) 05/23/2015    Surgical History: Past Surgical History:  Procedure Laterality Date  . back surgery five times  04/2016  . CARPAL TUNNEL RELEASE    . HERNIA REPAIR    . JOINT REPLACEMENT    . knee replacement right    . right hip replacement    . rotator cuff surgery    . SPINE SURGERY      Home Medications:  Allergies as of 02/19/2019      Reactions   Cefoperazone Anaphylaxis   Penicillins Anaphylaxis, Other (See Comments)   Has patient had a PCN reaction causing immediate rash, facial/tongue/throat swelling, SOB or lightheadedness with hypotension: Yes Has patient had a PCN reaction causing severe rash involving mucus membranes or skin necrosis: No Has patient had a PCN reaction that required hospitalization No Has patient had a PCN reaction occurring within the last 10 years: No If all of the above answers are "NO", then may proceed with Cephalosporin use.   Latex Rash      Medication List       Accurate as of February 19, 2019  4:44 PM. If you have any questions, ask your nurse or doctor.        STOP taking these medications   methotrexate 2.5 MG tablet Commonly known as: RHEUMATREX Stopped by: Debroah Loop, PA-C     TAKE these medications   acetaminophen 325 MG tablet Commonly known as: TYLENOL Take 2 tablets (650 mg total) by mouth every 6 (six) hours as needed for mild pain (or Fever >/= 101).   aspirin 81 MG chewable tablet Chew 81 mg by mouth daily.   B-complex with vitamin C tablet Take 1 tablet by mouth daily.   Cinnamon 500 MG capsule Take 500 mg by mouth 2 (two) times daily.   Enbrel SureClick 50 MG/ML injection Generic drug: etanercept Inject 50 mg into the skin every Friday.   fluconazole 150 MG tablet Commonly  known as: DIFLUCAN Take 1 tablet (150 mg total) by mouth once for 1 dose. Started by: Debroah Loop, PA-C   folic acid [id number] MCG tablet Commonly known as: FOLVITE Take 800 mcg by mouth daily.   gabapentin 300 MG capsule Commonly known as: Neurontin Take 1 capsule (300 mg total) by mouth 4 (four) times daily. QHS   hydroxychloroquine 200 MG tablet Commonly known as: PLAQUENIL Take 400 mg by mouth at bedtime.   insulin aspart 100 UNIT/ML injection Commonly known as: novoLOG Inject 0-9 Units into the skin 3 (three) times daily with meals. CBG < 70: implement hypoglycemia protocol CBG 70 - 120: 0  units CBG 121 - 150: 1 unit CBG 151 - 200: 2 units CBG 201 - 250: 3 units CBG 251 - 300: 5 units CBG 301 - 350: 7 units CBG 351 - 400: 9 units CBG > 400: call MD and obtain STAT lab verification   insulin aspart 100 UNIT/ML injection Commonly known as: novoLOG Inject 0-5 Units into the skin at bedtime. CBG < 70: implement hypoglycemia protocol CBG 70 - 120: 0 units CBG 121 - 150: 0 units CBG 151 - 200: 0 units CBG 201 - 250: 2 units CBG 251 - 300: 3 units CBG 301 - 350: 4 units CBG 351 - 400: 5 units CBG > 400: call MD and obtain STAT lab verification   lamoTRIgine 200 MG tablet Commonly known as: LAMICTAL Take 200 mg by mouth 2 (two) times daily.   lamoTRIgine 25 MG tablet Commonly known as: LAMICTAL Take 25 mg by mouth 2 (two) times daily.   magnesium oxide 400 MG tablet Commonly known as: MAG-OX Take 400 mg by mouth daily.   methotrexate 2.5 MG tablet Commonly known as: RHEUMATREX TAKE 10 TABS BY MOUTH ONCE A WEEK   omeprazole 40 MG capsule Commonly known as: PRILOSEC Take 40 mg by mouth daily.   polyethylene glycol 17 g packet Commonly known as: MIRALAX / GLYCOLAX Take 17 g by mouth daily.   traZODone 50 MG tablet Commonly known as: DESYREL Take 50 mg by mouth at bedtime.       Allergies:  Allergies  Allergen Reactions  . Cefoperazone Anaphylaxis  . Penicillins Anaphylaxis and Other (See Comments)    Has patient had a PCN reaction causing immediate rash, facial/tongue/throat swelling, SOB or lightheadedness with hypotension: Yes Has patient had a PCN reaction causing severe rash involving mucus membranes or skin necrosis: No Has patient had a PCN reaction that required hospitalization No Has patient had a PCN reaction occurring within the last 10 years: No If all of the above answers are "NO", then may proceed with Cephalosporin use.  . Latex Rash    Family History: Family History  Problem Relation Age of Onset  . Hypertension Other   .  Hypertension Mother   . Dementia Mother   . Dementia Father   . Prostate cancer Neg Hx   . Bladder Cancer Neg Hx   . Kidney cancer Neg Hx     Social History:   reports that he has quit smoking. His smoking use included cigarettes. He has a 20.00 pack-year smoking history. He has never used smokeless tobacco. He reports that he does not drink alcohol. No history on file for drug.  ROS: UROLOGY Frequent Urination?: No Hard to postpone urination?: No Burning/pain with urination?: No Get up at night to urinate?: Yes Leakage of urine?: Yes Urine stream starts and stops?: No Trouble starting stream?: Yes Do you  have to strain to urinate?: No Blood in urine?: No Urinary tract infection?: No Sexually transmitted disease?: No Injury to kidneys or bladder?: No Painful intercourse?: No Weak stream?: No Erection problems?: Yes Penile pain?: No  Gastrointestinal Nausea?: No Vomiting?: No Indigestion/heartburn?: No Diarrhea?: No Constipation?: Yes  Constitutional Fever: No Night sweats?: No Weight loss?: No Fatigue?: No  Skin Skin rash/lesions?: Yes Itching?: No  Eyes Blurred vision?: No Double vision?: No  Ears/Nose/Throat Sore throat?: No Sinus problems?: No  Hematologic/Lymphatic Swollen glands?: Yes Easy bruising?: No  Cardiovascular Leg swelling?: Yes Chest pain?: No  Respiratory Cough?: No Shortness of breath?: No  Endocrine Excessive thirst?: No  Musculoskeletal Back pain?: Yes Joint pain?: Yes  Neurological Headaches?: No Dizziness?: No  Psychologic Depression?: No Anxiety?: No  Physical Exam: BP (!) 158/72   Pulse 92   Ht 6' (1.829 m)   Wt 248 lb (112.5 kg)   BMI 33.63 kg/m   Constitutional:  Alert and oriented, no acute distress, nontoxic appearing HEENT: [place], AT Cardiovascular: No clubbing, cyanosis, or edema Respiratory: Normal respiratory effort, no increased work of breathing GU: Uncircumcised penis.  Erythema along the  bilateral inguinal folds and scrotum with satellite lesions visible on the inner thighs. Skin: Healing wound on the caudal aspect of the left fourth MCP joint; edema of the BLEs Neurologic: In wheelchair Psychiatric: Normal mood and affect  Laboratory Data: Results for orders placed or performed in visit on 02/19/19  Microscopic Examination   URINE  Result Value Ref Range   WBC, UA 6-10 (A) 0 - 5 /hpf   RBC None seen 0 - 2 /hpf   Epithelial Cells (non renal) 0-10 0 - 10 /hpf   Bacteria, UA Moderate (A) None seen/Few  Urinalysis, Complete  Result Value Ref Range   Specific Gravity, UA 1.020 1.005 - 1.030   pH, UA 7.0 5.0 - 7.5   Color, UA Yellow Yellow   Appearance Ur Clear Clear   Leukocytes,UA Trace (A) Negative   Protein,UA Negative Negative/Trace   Glucose, UA Negative Negative   Ketones, UA Trace (A) Negative   RBC, UA Negative Negative   Bilirubin, UA Negative Negative   Urobilinogen, Ur 1.0 0.2 - 1.0 mg/dL   Nitrite, UA Negative Negative   Microscopic Examination See below:    Assessment & Plan:   1. Encounter for circumcision Patient is curious about circumcision.  He does not seem to be at risk of phimosis or paraphimosis at this time and describes minimal bothersome symptoms associated with being uncircumcised.  I believe this would be an elective procedure at this point.  He agrees that this is not an urgent need and states he would prefer to resolve his dermatitis, as described below, prior to proceeding with this.  Will treat as described below and have him return to the office in 3 months to meet with an MD to discuss his surgical options. -Return in about 3 months (around 05/22/2019) for Circumcision consultation with MD.  2. Dermatitis Erythematous rash of the scrotum and inner thighs with satellite lesions present.  I suspect candidiasis, particularly given his history of rheumatoid arthritis on multiple immunosuppressive medications.  Will prescribe Diflucan 1 or 50  mg today for treatment of this.  I suspect he is also having some moisture dermatitis secondary to urinary leakage, particularly overnight.  I advised him to start using absorbent underwear when he sleeps. - fluconazole (DIFLUCAN) 150 MG tablet; Take 1 tablet (150 mg total) by mouth once for 1  dose.  Dispense: 1 tablet; Refill: 0 - Urinalysis, Complete  3. Urinary urgency Patient reports urinary urgency with rare associated incontinence.  He does have BLE edema and mobility issues secondary to this.  I suspect his incontinence is more functional than urge related.  Additionally, this does not seem to be particularly bothersome for him.  He does have a history of urinary retention and notes some difficulty initiating stream.  I will defer a trial of Myrbetriq in light of this.  Debroah Loop, PA-C  [facility] [address] [place], [place] [address] [phone]

## 2019-03-06 ENCOUNTER — Telehealth: Payer: Self-pay | Admitting: Physician Assistant

## 2019-03-06 NOTE — Telephone Encounter (Signed)
I agree patient should wear undergarments as directed and follow up next week.

## 2019-03-06 NOTE — Telephone Encounter (Signed)
Patient called @ 3:44 on Friday afternoon and stated that the problem he was Jeremiah [name] for on 02-19-19 has not gotten any better. He continues to wet the bed and has not been wearing the under garments that she advised him to wear. I told him that he needed to start wearing them tonight and that we would try to see him on  Monday.  Is there anything we can call in to help him over the weekend?   Sharyn Lull

## 2019-03-09 ENCOUNTER — Other Ambulatory Visit: Payer: Self-pay

## 2019-03-09 ENCOUNTER — Ambulatory Visit (INDEPENDENT_AMBULATORY_CARE_PROVIDER_SITE_OTHER): Payer: Medicare Other | Admitting: Physician Assistant

## 2019-03-09 ENCOUNTER — Encounter: Payer: Self-pay | Admitting: Physician Assistant

## 2019-03-09 VITALS — BP 125/79 | HR 92 | Ht 72.0 in | Wt 252.0 lb

## 2019-03-09 DIAGNOSIS — L309 Dermatitis, unspecified: Secondary | ICD-10-CM

## 2019-03-09 DIAGNOSIS — N401 Enlarged prostate with lower urinary tract symptoms: Secondary | ICD-10-CM

## 2019-03-09 DIAGNOSIS — R3915 Urgency of urination: Secondary | ICD-10-CM | POA: Diagnosis not present

## 2019-03-09 LAB — BLADDER SCAN AMB NON-IMAGING

## 2019-03-09 MED ORDER — NYSTATIN-TRIAMCINOLONE 100000-0.1 UNIT/GM-% EX OINT: 1.0000 "application " | TOPICAL_OINTMENT | Freq: Two times a day (BID) | CUTANEOUS | 2 refills | Status: DC

## 2019-03-09 NOTE — Progress Notes (Signed)
03/09/2019 1:16 PM   Vevelyn [name]. Sep 15, 1940 [id number]  CC: Continued groin and testicle irritation  HPI: Jeremiah [name]. is a 78 y.o. male who presents today for persistence of groin and testicle irritation, first evaluated by me on 02/19/2019.  At that time, he reported skin irritation of the scrotum and perineum with mild itching, sour odor, and inguinal moisture associated with urinary incontinence.  He was not wearing absorbent pads to manage his incontinence at that time.  Physical exam concerning for candidal dermatitis; I prescribed Diflucan 150 mg x 1 dose and counseled him to begin wearing absorbent underwear overnight.  He called the office 3 days ago and reported persistence of his symptoms.  He has started wearing absorbant underwear at night and states he originally felt the Diflucan helped, however the skin irritation and pruritis have returned.  He also reports urinary frequency, nocturia, urinary leakage, hesitancy, and weak stream.  He states his urinary incontinence is associated with urge. He does have some mobility issues that make it more difficult for him to reach the bathroom in time. He is no longer taking tamsulosin for his BPH. He reports he does take Lasix and has BLE edema that he treats with pneumatic compression devices twice daily. PVR 67mL today.  PMH: Past Medical History:  Diagnosis Date  . Allergy   . Arthritis   . Hypertension   . Rheumatoid arthritis ([place])   . Seizures ([place])   . Sepsis ([place]) 05/23/2015    Surgical History: Past Surgical History:  Procedure Laterality Date  . back surgery five times  04/2016  . CARPAL TUNNEL RELEASE    . HERNIA REPAIR    . JOINT REPLACEMENT    . knee replacement right    . right hip replacement    . rotator cuff surgery    . SPINE SURGERY      Home Medications:  Allergies as of 03/09/2019      Reactions   Cefoperazone Anaphylaxis   Penicillins Anaphylaxis, Other (See Comments)   Has patient  had a PCN reaction causing immediate rash, facial/tongue/throat swelling, SOB or lightheadedness with hypotension: Yes Has patient had a PCN reaction causing severe rash involving mucus membranes or skin necrosis: No Has patient had a PCN reaction that required hospitalization No Has patient had a PCN reaction occurring within the last 10 years: No If all of the above answers are "NO", then may proceed with Cephalosporin use.   Latex Rash      Medication List       Accurate as of March 09, 2019  1:16 PM. If you have any questions, ask your nurse or doctor.        STOP taking these medications   B-complex with vitamin C tablet Stopped by: Debroah Loop, PA-C   insulin aspart 100 UNIT/ML injection Commonly known as: novoLOG Stopped by: Debroah Loop, PA-C     TAKE these medications   acetaminophen 325 MG tablet Commonly known as: TYLENOL Take 2 tablets (650 mg total) by mouth every 6 (six) hours as needed for mild pain (or Fever >/= 101).   aspirin 81 MG chewable tablet Chew 81 mg by mouth daily.   Cinnamon 500 MG capsule Take 500 mg by mouth 2 (two) times daily.   Enbrel SureClick 50 MG/ML injection Generic drug: etanercept Inject 50 mg into the skin every Friday.   folic acid Q000111Q MCG tablet Commonly known as: FOLVITE Take 800 mcg by mouth daily.  gabapentin 300 MG capsule Commonly known as: Neurontin Take 1 capsule (300 mg total) by mouth 4 (four) times daily. QHS   hydroxychloroquine 200 MG tablet Commonly known as: PLAQUENIL Take 400 mg by mouth at bedtime.   lamoTRIgine 200 MG tablet Commonly known as: LAMICTAL Take 200 mg by mouth 2 (two) times daily.   lamoTRIgine 25 MG tablet Commonly known as: LAMICTAL Take 25 mg by mouth 2 (two) times daily.   magnesium oxide 400 MG tablet Commonly known as: MAG-OX Take 400 mg by mouth daily.   methotrexate 2.5 MG tablet Commonly known as: RHEUMATREX TAKE 10 TABS BY MOUTH ONCE A WEEK    nystatin-triamcinolone ointment Commonly known as: MYCOLOG Apply 1 application topically 2 (two) times daily. Started by: Debroah Loop, PA-C   omeprazole 40 MG capsule Commonly known as: PRILOSEC Take 40 mg by mouth daily.   polyethylene glycol 17 g packet Commonly known as: MIRALAX / GLYCOLAX Take 17 g by mouth daily.   traZODone 50 MG tablet Commonly known as: DESYREL Take 50 mg by mouth at bedtime.       Allergies:  Allergies  Allergen Reactions  . Cefoperazone Anaphylaxis  . Penicillins Anaphylaxis and Other (See Comments)    Has patient had a PCN reaction causing immediate rash, facial/tongue/throat swelling, SOB or lightheadedness with hypotension: Yes Has patient had a PCN reaction causing severe rash involving mucus membranes or skin necrosis: No Has patient had a PCN reaction that required hospitalization No Has patient had a PCN reaction occurring within the last 10 years: No If all of the above answers are "NO", then may proceed with Cephalosporin use.  . Latex Rash    Family History: Family History  Problem Relation Age of Onset  . Hypertension Other   . Hypertension Mother   . Dementia Mother   . Dementia Father   . Prostate cancer Neg Hx   . Bladder Cancer Neg Hx   . Kidney cancer Neg Hx     Social History:   reports that he has quit smoking. His smoking use included cigarettes. He has a 20.00 pack-year smoking history. He has never used smokeless tobacco. He reports that he does not drink alcohol. No history on file for drug.  ROS: UROLOGY Frequent Urination?: Yes Hard to postpone urination?: No Burning/pain with urination?: No Get up at night to urinate?: Yes Leakage of urine?: Yes Urine stream starts and stops?: No Trouble starting stream?: Yes Do you have to strain to urinate?: No Blood in urine?: No Urinary tract infection?: No Sexually transmitted disease?: No Injury to kidneys or bladder?: No Painful intercourse?: No Weak  stream?: No Erection problems?: Yes Penile pain?: No  Gastrointestinal Nausea?: No Vomiting?: No Indigestion/heartburn?: No Diarrhea?: No Constipation?: Yes  Constitutional Fever: No Night sweats?: No Weight loss?: No Fatigue?: No  Skin Skin rash/lesions?: No Itching?: No  Eyes Blurred vision?: No Double vision?: No  Ears/Nose/Throat Sore throat?: No Sinus problems?: No  Hematologic/Lymphatic Swollen glands?: No Easy bruising?: No  Cardiovascular Leg swelling?: No Chest pain?: No  Respiratory Cough?: No Shortness of breath?: No  Endocrine Excessive thirst?: No  Musculoskeletal Back pain?: Yes Joint pain?: Yes  Neurological Headaches?: No Dizziness?: No  Psychologic Depression?: No Anxiety?: No  Physical Exam: BP 125/79   Pulse 92   Ht 6' (1.829 m)   Wt 252 lb (114.3 kg)   BMI 34.18 kg/m   Constitutional:  Alert and oriented, no acute distress, nontoxic appearing HEENT: [place], AT Cardiovascular: No  clubbing or cyanosis; BLE edema Respiratory: Normal respiratory effort, no increased work of breathing GU: Erythema of the intertriginous zones, L>R.  No satellite lesions noted.  Skin damp.  Skin intact with no areas of erosion or open wound. Skin: No rashes, bruises or suspicious lesions Neurologic: In wheelchair, requires assist to stand. Psychiatric: Normal mood and affect  Laboratory Data: Results for orders placed or performed in visit on 03/09/19  BLADDER SCAN AMB NON-IMAGING  Result Value Ref Range   Scan Result 67mL    Assessment & Plan:   1. Dermatitis Skin integrity improved since last appointment, however he does have worsening of erythema particularly along the left inguinal crease.  Prescribing combination antifungal and steroid cream to use topically for this.  I counseled him to continue use until symptoms abate.  I believe his dermatitis is largely secondary to moisture at this point.  I counseled him that he should be using  absorbent underwear around the clock at this point as well as absorbent powder in his groin area.  He states he has medicated powder at home and will begin to use this. - nystatin-triamcinolone ointment (MYCOLOG); Apply 1 application topically 2 (two) times daily.  Dispense: 30 g; Refill: 2  2. Benign prostatic hyperplasia with lower urinary tract symptoms, symptom details unspecified Patient with a history of acute urinary retention earlier this year during hospitalization.  He does report some hesitancy and weak stream, and was unable to void in clinic upon prompting.  PVR 0; I am not concerned for urinary retention at this time.  He is no longer taking tamsulosin. - BLADDER SCAN AMB NON-IMAGING  3. Urinary urgency Patient presents with known history of BPH as well as urinary incontinence associated with urgency.  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  I will start him on a trial of Myrbetriq 25 mg daily today with the goal of decreasing his urinary incontinence.    I counseled him to call the office immediately or proceed to the emergency department if after hours if he becomes unable to urinate, feels unable to empty his bladder, or develops acute onset abdominal distention or pain.  He expressed understanding.  I would like him to follow-up in 1 month to discuss improvement on Myrbetriq.  May consider physical therapy at that time. - Myrbetriq 25mg  daily x1 month trial -Return in about 4 weeks (around 04/06/2019) for PVR and OAB questionnaire.   I spent 25 min with this patient, of which greater than 50% was spent in counseling and coordination of care with the patient.   Debroah Loop, PA-C  [facility] [address] [place], [place] [address] [phone]

## 2019-03-31 ENCOUNTER — Inpatient Hospital Stay
Admission: EM | Admit: 2019-03-31 | Discharge: 2019-04-27 | DRG: 602 | Disposition: A | Discharge status: Skilled Nursing Facility | Payer: Medicare Other | Attending: Internal Medicine | Admitting: Internal Medicine

## 2019-03-31 ENCOUNTER — Other Ambulatory Visit: Payer: Self-pay

## 2019-03-31 DIAGNOSIS — L89620 Pressure ulcer of left heel, unstageable: Secondary | ICD-10-CM | POA: Diagnosis present

## 2019-03-31 DIAGNOSIS — L03116 Cellulitis of left lower limb: Secondary | ICD-10-CM | POA: Diagnosis present

## 2019-03-31 DIAGNOSIS — Z66 Do not resuscitate: Secondary | ICD-10-CM | POA: Diagnosis present

## 2019-03-31 DIAGNOSIS — D61818 Other pancytopenia: Secondary | ICD-10-CM

## 2019-03-31 DIAGNOSIS — Z96651 Presence of right artificial knee joint: Secondary | ICD-10-CM | POA: Diagnosis present

## 2019-03-31 DIAGNOSIS — Z79899 Other long term (current) drug therapy: Secondary | ICD-10-CM

## 2019-03-31 DIAGNOSIS — L0390 Cellulitis, unspecified: Secondary | ICD-10-CM | POA: Insufficient documentation

## 2019-03-31 DIAGNOSIS — L03115 Cellulitis of right lower limb: Principal | ICD-10-CM

## 2019-03-31 DIAGNOSIS — L03119 Cellulitis of unspecified part of limb: Secondary | ICD-10-CM | POA: Diagnosis present

## 2019-03-31 DIAGNOSIS — L89312 Pressure ulcer of right buttock, stage 2: Secondary | ICD-10-CM | POA: Diagnosis present

## 2019-03-31 DIAGNOSIS — D709 Neutropenia, unspecified: Secondary | ICD-10-CM

## 2019-03-31 DIAGNOSIS — L89322 Pressure ulcer of left buttock, stage 2: Secondary | ICD-10-CM | POA: Diagnosis present

## 2019-03-31 DIAGNOSIS — I872 Venous insufficiency (chronic) (peripheral): Secondary | ICD-10-CM | POA: Diagnosis present

## 2019-03-31 DIAGNOSIS — R509 Fever, unspecified: Secondary | ICD-10-CM

## 2019-03-31 DIAGNOSIS — R262 Difficulty in walking, not elsewhere classified: Secondary | ICD-10-CM | POA: Diagnosis present

## 2019-03-31 DIAGNOSIS — G9341 Metabolic encephalopathy: Secondary | ICD-10-CM | POA: Diagnosis present

## 2019-03-31 DIAGNOSIS — L8990 Pressure ulcer of unspecified site, unspecified stage: Secondary | ICD-10-CM | POA: Diagnosis not present

## 2019-03-31 DIAGNOSIS — Z6831 Body mass index (BMI) 31.0-31.9, adult: Secondary | ICD-10-CM

## 2019-03-31 DIAGNOSIS — G629 Polyneuropathy, unspecified: Secondary | ICD-10-CM | POA: Diagnosis present

## 2019-03-31 DIAGNOSIS — Z96641 Presence of right artificial hip joint: Secondary | ICD-10-CM | POA: Diagnosis present

## 2019-03-31 DIAGNOSIS — Z91040 Latex allergy status: Secondary | ICD-10-CM

## 2019-03-31 DIAGNOSIS — Z880 Allergy status to penicillin: Secondary | ICD-10-CM

## 2019-03-31 DIAGNOSIS — F0390 Unspecified dementia without behavioral disturbance: Secondary | ICD-10-CM | POA: Diagnosis present

## 2019-03-31 DIAGNOSIS — G894 Chronic pain syndrome: Secondary | ICD-10-CM | POA: Diagnosis present

## 2019-03-31 DIAGNOSIS — L89152 Pressure ulcer of sacral region, stage 2: Secondary | ICD-10-CM | POA: Diagnosis present

## 2019-03-31 DIAGNOSIS — I890 Lymphedema, not elsewhere classified: Secondary | ICD-10-CM | POA: Diagnosis not present

## 2019-03-31 DIAGNOSIS — M069 Rheumatoid arthritis, unspecified: Secondary | ICD-10-CM | POA: Diagnosis present

## 2019-03-31 DIAGNOSIS — R5081 Fever presenting with conditions classified elsewhere: Secondary | ICD-10-CM | POA: Diagnosis not present

## 2019-03-31 DIAGNOSIS — R569 Unspecified convulsions: Secondary | ICD-10-CM

## 2019-03-31 DIAGNOSIS — Z8249 Family history of ischemic heart disease and other diseases of the circulatory system: Secondary | ICD-10-CM

## 2019-03-31 DIAGNOSIS — E538 Deficiency of other specified B group vitamins: Secondary | ICD-10-CM

## 2019-03-31 DIAGNOSIS — G40909 Epilepsy, unspecified, not intractable, without status epilepticus: Secondary | ICD-10-CM | POA: Diagnosis present

## 2019-03-31 DIAGNOSIS — L89892 Pressure ulcer of other site, stage 2: Secondary | ICD-10-CM | POA: Diagnosis present

## 2019-03-31 DIAGNOSIS — I10 Essential (primary) hypertension: Secondary | ICD-10-CM | POA: Diagnosis present

## 2019-03-31 DIAGNOSIS — K121 Other forms of stomatitis: Secondary | ICD-10-CM | POA: Diagnosis present

## 2019-03-31 DIAGNOSIS — F05 Delirium due to known physiological condition: Secondary | ICD-10-CM | POA: Diagnosis present

## 2019-03-31 DIAGNOSIS — Z87891 Personal history of nicotine dependence: Secondary | ICD-10-CM

## 2019-03-31 DIAGNOSIS — R1312 Dysphagia, oropharyngeal phase: Secondary | ICD-10-CM | POA: Diagnosis not present

## 2019-03-31 DIAGNOSIS — K1230 Oral mucositis (ulcerative), unspecified: Secondary | ICD-10-CM | POA: Diagnosis not present

## 2019-03-31 DIAGNOSIS — Z7982 Long term (current) use of aspirin: Secondary | ICD-10-CM

## 2019-03-31 DIAGNOSIS — B372 Candidiasis of skin and nail: Secondary | ICD-10-CM | POA: Diagnosis present

## 2019-03-31 DIAGNOSIS — N179 Acute kidney failure, unspecified: Secondary | ICD-10-CM

## 2019-03-31 DIAGNOSIS — D899 Disorder involving the immune mechanism, unspecified: Secondary | ICD-10-CM | POA: Diagnosis present

## 2019-03-31 DIAGNOSIS — E46 Unspecified protein-calorie malnutrition: Secondary | ICD-10-CM | POA: Clinically undetermined

## 2019-03-31 DIAGNOSIS — Z818 Family history of other mental and behavioral disorders: Secondary | ICD-10-CM

## 2019-03-31 DIAGNOSIS — R1310 Dysphagia, unspecified: Secondary | ICD-10-CM

## 2019-03-31 DIAGNOSIS — R609 Edema, unspecified: Secondary | ICD-10-CM

## 2019-03-31 DIAGNOSIS — L89610 Pressure ulcer of right heel, unstageable: Secondary | ICD-10-CM | POA: Diagnosis present

## 2019-03-31 DIAGNOSIS — T451X5A Adverse effect of antineoplastic and immunosuppressive drugs, initial encounter: Secondary | ICD-10-CM | POA: Diagnosis not present

## 2019-03-31 DIAGNOSIS — L304 Erythema intertrigo: Secondary | ICD-10-CM | POA: Diagnosis present

## 2019-03-31 DIAGNOSIS — Z820 Family history of epilepsy and other diseases of the nervous system: Secondary | ICD-10-CM

## 2019-03-31 DIAGNOSIS — E119 Type 2 diabetes mellitus without complications: Secondary | ICD-10-CM | POA: Diagnosis present

## 2019-03-31 DIAGNOSIS — Z888 Allergy status to other drugs, medicaments and biological substances status: Secondary | ICD-10-CM

## 2019-03-31 DIAGNOSIS — Z20828 Contact with and (suspected) exposure to other viral communicable diseases: Secondary | ICD-10-CM | POA: Diagnosis present

## 2019-03-31 DIAGNOSIS — Z515 Encounter for palliative care: Secondary | ICD-10-CM | POA: Diagnosis present

## 2019-03-31 LAB — CBC WITH DIFFERENTIAL/PLATELET
Abs Immature Granulocytes: 0.01 10*3/uL (ref 0.00–0.07)
Basophils Absolute: 0 10*3/uL (ref 0.0–0.1)
Basophils Relative: 1 %
Eosinophils Absolute: 0.4 10*3/uL (ref 0.0–0.5)
Eosinophils Relative: 12 %
HCT: 30.5 % — ABNORMAL LOW (ref 39.0–52.0)
Hemoglobin: 10 g/dL — ABNORMAL LOW (ref 13.0–17.0)
Immature Granulocytes: 0 %
Lymphocytes Relative: 27 %
Lymphs Abs: 0.8 10*3/uL (ref 0.7–4.0)
MCH: 28.3 pg (ref 26.0–34.0)
MCHC: 32.8 g/dL (ref 30.0–36.0)
MCV: 86.4 fL (ref 80.0–100.0)
Monocytes Absolute: 0.2 10*3/uL (ref 0.1–1.0)
Monocytes Relative: 6 %
Neutro Abs: 1.6 10*3/uL — ABNORMAL LOW (ref 1.7–7.7)
Neutrophils Relative %: 54 %
Platelets: 206 10*3/uL (ref 150–400)
RBC: 3.53 MIL/uL — ABNORMAL LOW (ref 4.22–5.81)
RDW: 17.5 % — ABNORMAL HIGH (ref 11.5–15.5)
WBC: 3 10*3/uL — ABNORMAL LOW (ref 4.0–10.5)
nRBC: 0 % (ref 0.0–0.2)

## 2019-03-31 LAB — BASIC METABOLIC PANEL
Anion gap: 12 (ref 5–15)
BUN: 46 mg/dL — ABNORMAL HIGH (ref 8–23)
CO2: 28 mmol/L (ref 22–32)
Calcium: 8.9 mg/dL (ref 8.9–10.3)
Chloride: 99 mmol/L (ref 98–111)
Creatinine, Ser: 3.12 mg/dL — ABNORMAL HIGH (ref 0.61–1.24)
GFR calc Af Amer: 21 mL/min — ABNORMAL LOW (ref >60)
GFR calc non Af Amer: 18 mL/min — ABNORMAL LOW (ref >60)
Glucose, Bld: 103 mg/dL — ABNORMAL HIGH (ref 70–99)
Potassium: 3.3 mmol/L — ABNORMAL LOW (ref 3.5–5.1)
Sodium: 139 mmol/L (ref 135–145)

## 2019-03-31 LAB — LACTIC ACID, PLASMA: Lactic Acid, Venous: 0.9 mmol/L (ref 0.5–1.9)

## 2019-03-31 LAB — OSMOLALITY: Osmolality: 305 mOsm/kg — ABNORMAL HIGH (ref 275–295)

## 2019-03-31 LAB — C-REACTIVE PROTEIN: CRP: 4.1 mg/dL — ABNORMAL HIGH (ref <1.0)

## 2019-03-31 LAB — SEDIMENTATION RATE: Sed Rate: 37 mm/hr — ABNORMAL HIGH (ref 0–20)

## 2019-03-31 MED ORDER — SODIUM CHLORIDE 0.9% FLUSH
3.0000 mL | Freq: Two times a day (BID) | INTRAVENOUS | Status: DC
  Administered 2019-03-31 – 2019-04-11 (×22): 3 mL via INTRAVENOUS
  Administered 2019-04-11: via INTRAVENOUS
  Administered 2019-04-12 – 2019-04-27 (×23): 3 mL via INTRAVENOUS

## 2019-03-31 MED ORDER — ASPIRIN 81 MG PO CHEW
81.0000 mg | CHEWABLE_TABLET | Freq: Every day | ORAL | Status: DC
  Administered 2019-04-01 – 2019-04-19 (×19): 81 mg via ORAL
  Filled 2019-03-31 (×19): qty 1

## 2019-03-31 MED ORDER — DIPHENHYDRAMINE HCL 50 MG/ML IJ SOLN
25.0000 mg | Freq: Once | INTRAMUSCULAR | Status: AC
  Administered 2019-03-31: 25 mg via INTRAVENOUS
  Filled 2019-03-31: qty 1

## 2019-03-31 MED ORDER — ONDANSETRON HCL 4 MG/2ML IJ SOLN: 4.0000 mg | Freq: Four times a day (QID) | INTRAMUSCULAR | Status: DC | PRN

## 2019-03-31 MED ORDER — VANCOMYCIN HCL 10 G IV SOLR: 1250.0000 mg | INTRAVENOUS | Status: DC

## 2019-03-31 MED ORDER — ACETAMINOPHEN 650 MG RE SUPP
650.0000 mg | Freq: Four times a day (QID) | RECTAL | Status: DC | PRN
  Filled 2019-03-31: qty 1

## 2019-03-31 MED ORDER — SODIUM CHLORIDE 0.9 % IV BOLUS
500.0000 mL | Freq: Once | INTRAVENOUS | Status: AC
  Administered 2019-03-31: 500 mL via INTRAVENOUS

## 2019-03-31 MED ORDER — ALBUMIN HUMAN 25 % IV SOLN
12.5000 g | Freq: Once | INTRAVENOUS | Status: AC
  Administered 2019-03-31: 20:00:00 12.5 g via INTRAVENOUS
  Filled 2019-03-31: qty 50

## 2019-03-31 MED ORDER — GABAPENTIN 300 MG PO CAPS
300.0000 mg | ORAL_CAPSULE | Freq: Four times a day (QID) | ORAL | Status: DC
  Administered 2019-03-31 – 2019-04-01 (×2): 300 mg via ORAL
  Filled 2019-03-31 (×2): qty 1

## 2019-03-31 MED ORDER — MORPHINE SULFATE (PF) 4 MG/ML IV SOLN
4.0000 mg | Freq: Once | INTRAVENOUS | Status: AC
  Administered 2019-03-31: 15:00:00 4 mg via INTRAVENOUS
  Filled 2019-03-31: qty 1

## 2019-03-31 MED ORDER — TRAZODONE HCL 50 MG PO TABS
50.0000 mg | ORAL_TABLET | Freq: Every day | ORAL | Status: DC
  Administered 2019-03-31 – 2019-04-19 (×20): 50 mg via ORAL
  Filled 2019-03-31 (×20): qty 1

## 2019-03-31 MED ORDER — SODIUM CHLORIDE 0.9 % IV SOLN
INTRAVENOUS | Status: DC | PRN
  Administered 2019-03-31 – 2019-04-02 (×2): 250 mL via INTRAVENOUS
  Administered 2019-04-10 – 2019-04-11 (×2): 1000 mL via INTRAVENOUS
  Administered 2019-04-11: 500 mL via INTRAVENOUS

## 2019-03-31 MED ORDER — LAMOTRIGINE 100 MG PO TABS
200.0000 mg | ORAL_TABLET | Freq: Two times a day (BID) | ORAL | Status: DC
  Administered 2019-03-31 – 2019-04-19 (×38): 200 mg via ORAL
  Filled 2019-03-31 (×39): qty 2

## 2019-03-31 MED ORDER — ORAL CARE MOUTH RINSE
15.0000 mL | Freq: Two times a day (BID) | OROMUCOSAL | Status: DC
  Administered 2019-03-31 – 2019-04-21 (×37): 15 mL via OROMUCOSAL

## 2019-03-31 MED ORDER — POLYETHYLENE GLYCOL 3350 17 G PO PACK: 17.0000 g | PACK | Freq: Every day | ORAL | Status: DC | PRN

## 2019-03-31 MED ORDER — VANCOMYCIN HCL IN DEXTROSE 1-5 GM/200ML-% IV SOLN
1000.0000 mg | Freq: Once | INTRAVENOUS | Status: AC
  Administered 2019-03-31: 15:00:00 1000 mg via INTRAVENOUS
  Filled 2019-03-31: qty 200

## 2019-03-31 MED ORDER — PANTOPRAZOLE SODIUM 40 MG PO TBEC
40.0000 mg | DELAYED_RELEASE_TABLET | Freq: Every day | ORAL | Status: DC
  Administered 2019-04-01 – 2019-04-19 (×18): 40 mg via ORAL
  Filled 2019-03-31 (×18): qty 1

## 2019-03-31 MED ORDER — HEPARIN SODIUM (PORCINE) 5000 UNIT/ML IJ SOLN
5000.0000 [IU] | Freq: Three times a day (TID) | INTRAMUSCULAR | Status: DC
  Administered 2019-03-31 – 2019-04-03 (×8): 5000 [IU] via SUBCUTANEOUS
  Filled 2019-03-31 (×8): qty 1

## 2019-03-31 MED ORDER — ACETAMINOPHEN 325 MG PO TABS
650.0000 mg | ORAL_TABLET | Freq: Four times a day (QID) | ORAL | Status: DC | PRN
  Administered 2019-04-06 – 2019-04-12 (×3): 650 mg via ORAL
  Filled 2019-03-31 (×3): qty 2

## 2019-03-31 MED ORDER — OXYCODONE HCL 5 MG PO TABS
5.0000 mg | ORAL_TABLET | Freq: Four times a day (QID) | ORAL | Status: DC | PRN
  Administered 2019-03-31 – 2019-04-12 (×19): 5 mg via ORAL
  Filled 2019-03-31 (×20): qty 1

## 2019-03-31 MED ORDER — LAMOTRIGINE 25 MG PO TABS
25.0000 mg | ORAL_TABLET | Freq: Two times a day (BID) | ORAL | Status: DC
  Administered 2019-03-31 – 2019-04-19 (×38): 25 mg via ORAL
  Filled 2019-03-31 (×39): qty 1

## 2019-03-31 MED ORDER — ONDANSETRON HCL 4 MG PO TABS: 4.0000 mg | ORAL_TABLET | Freq: Four times a day (QID) | ORAL | Status: DC | PRN

## 2019-03-31 MED ORDER — VANCOMYCIN HCL IN DEXTROSE 1-5 GM/200ML-% IV SOLN
1000.0000 mg | Freq: Once | INTRAVENOUS | Status: AC
  Administered 2019-03-31: 1000 mg via INTRAVENOUS
  Filled 2019-03-31: qty 200

## 2019-03-31 NOTE — ED Notes (Signed)
Redness to left arm gone, ABX restarted.

## 2019-03-31 NOTE — Consult Note (Signed)
Pharmacy Antibiotic Note  Jeremiah [name]. is a 78 y.o. male admitted on 03/31/2019 with cellulitis. Past medical history significant for lymphedema, hypertension, diabetes, peripheral artery disease. Patient with leg pain and swelling in lower extremities over last two weeks with worsening erythema. He was recently evaluated at [facility] ED approximately one week ago and discharged on ciprofloxacin and clindamycin but symptoms have progressed despite antibiotics. Pharmacy has been consulted for vancomycin dosing.   Patient has been ordered vancomycin 1 g + 1 g for a total loading dose of 2 g. Scr on admission is elevated to 3.12 and per [facility] records it was 1.4 on 11/5. Other labs significant for leukopenia with WBC of 3 and a mild neutropenia on differential. Patient is afebrile.   Plan: Vancomycin 1250 mg IV q48h. Goal AUC 400-550. Expected AUC: 464.9, trough 11.1 SCr used: 3.12  Continue to monitor daily Scr per protocol and adjust antibiotic dosing as indicated.  Height: 6' (182.9 cm) Weight: 263 lb 14.3 oz (119.7 kg) IBW/kg (Calculated) : 77.6  Temp (24hrs), Avg:97.6 F (36.4 C), Min:97.4 F (36.3 C), Max:97.8 F (36.6 C)  Recent Labs  Lab 03/31/19 1440  WBC 3.0*  CREATININE 3.12*  LATICACIDVEN 0.9    Estimated Creatinine Clearance: 26.5 mL/min (A) (by C-G formula based on SCr of 3.12 mg/dL (H)).    Allergies  Allergen Reactions  . Cefoperazone Anaphylaxis  . Penicillins Anaphylaxis and Other (See Comments)    Has patient had a PCN reaction causing immediate rash, facial/tongue/throat swelling, SOB or lightheadedness with hypotension: Yes Has patient had a PCN reaction causing severe rash involving mucus membranes or skin necrosis: No Has patient had a PCN reaction that required hospitalization No Has patient had a PCN reaction occurring within the last 10 years: No If all of the above answers are "NO", then may proceed with Cephalosporin use.  . Latex Rash     Antimicrobials this admission: Vancomycin 11/10 >>   Dose adjustments this admission: n/a  Microbiology results: 11/10 BCx: pending 11/10 COVID-19: pending  Thank you for allowing pharmacy to be a part of this patient's care.  [place] Resident 03/31/2019 6:49 PM

## 2019-03-31 NOTE — ED Notes (Signed)
Report to Terry, RN

## 2019-03-31 NOTE — Progress Notes (Signed)
Xcover 734ml urine in bladder, pt unable to urinate RN requesting foley

## 2019-03-31 NOTE — ED Provider Notes (Signed)
[facility] Emergency Department Provider Note   ____________________________________________   First MD Initiated Contact with Patient 03/31/19 1420     (approximate)  I have reviewed the triage vital signs and the nursing notes.   HISTORY  Chief Complaint Leg Swelling and Recurrent Skin Infections    HPI Jeremiah Cail. is a 78 y.o. male with past medical history of lymphedema, hypertension, diabetes, PAD who presents to the ED complaining of leg pain and swelling.  Patient reports he has had increasing swelling to his lower extremities over the past 2 weeks to worse than it has ever been in the past.  He has also noticed increasing redness to both legs over about the past week, with the worst area being located over his right lower extremity.  He has noticed increased pain over the medial portion of his right lower extremity and has been unable to walk since his symptoms worsened.  He denies any fevers, chills, cough, chest pain, shortness of breath, nausea, or vomiting.  He was evaluated in the [facility] emergency department about 1 week ago and started on Cipro and Clinda for lower extremity cellulitis.  He states that if anything his symptoms have worsened since then.        Past Medical History:  Diagnosis Date  . Allergy   . Arthritis   . Hypertension   . Rheumatoid arthritis ([place])   . Seizures ([place])   . Sepsis ([place]) 05/23/2015    Patient Active Problem List   Diagnosis Date Noted  . Cellulitis 03/31/2019  . Edema of both legs 08/19/2018  . History of allergy to latex 07/22/2018  . Lymphedema 06/16/2018  . Chronic venous insufficiency 06/16/2018  . Venous ulcer of leg ([place]) 06/16/2018  . PAD (peripheral artery disease) ([place]) 06/16/2018  . Diabetes mellitus type 2, uncomplicated ([place]) 0000000  . 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 Obesity (BMI 30.0-34.9) 02/27/2018  . Primary osteoarthritis of left knee 02/27/2018  .  Urinary retention 02/25/2018  . Rheumatoid arthritis involving multiple sites ([place]) 02/25/2018  . Left knee pain 02/20/2018  . Neurogenic pain 10/02/2017  . Spondylosis without myelopathy or radiculopathy, lumbosacral region 07/17/2017  . Trigger point with back pain (recurrent/intermittent) 07/17/2017  . Chronic musculoskeletal pain 02/27/2017  . Myofascial pain syndrome 02/27/2017  . Long term current use of anticoagulant (Plaquenil) 02/27/2017  . Chronic lumbar radiculitis (L5 dermatome) (Right) 02/27/2017  . Chronic lumbar radiculitis (L3 dermatome) (Left) 02/27/2017  . Chronic foot pain (Right) 02/27/2017  . Chronic thoracic back pain (Bilateral) (R>L) 02/27/2017  . Chronic neck pain (posterior) (Bilateral) (R>L) 02/27/2017  . Chronic shoulder pain (Bilateral) (L>R) 02/27/2017  . 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 DDD (degenerative disc disease), lumbar 02/19/2017  . 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 Chronic knee pain (Bilateral) (R>L) 02/19/2017  . 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 Grade 1 Retrolisthesis of L1 over L2 02/19/2017  . T11 wedge compression fracture ([place]prox. 1993) 02/19/2017  . Lumbar facet arthropathy 02/19/2017  . Lumbar facet syndrome (Bilateral) 02/19/2017  . Lumbar foraminal stenosis (Bilateral: L1-2)  (Right: L3-4, L4-5) 02/19/2017  . 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 Disorder of skeletal system 02/19/2017  . Pharmacologic therapy 02/19/2017  . Problems influencing health status 02/19/2017  . Chronic pain syndrome 01/14/2017  . Gastroesophageal reflux disease 01/14/2017  . Hyperlipidemia 01/14/2017  . Lumbar radiculopathy 01/14/2017  . Postoperative  anemia due to acute blood loss 01/14/2017  . Postprocedural hypotension 01/14/2017  . Chronic lower extremity pain (Secondary Area of Pain) (Bilateral) (R>L) 01/14/2017  . Rheumatoid arthritis ([place]) 12/17/2013  . Diabetes  ([place]) 12/17/2013  . Benign essential hypertension 11/13/2013  . Chronic low back pain (Primary Area of Pain) (Bilateral) (R>L) 11/13/2013  . Prostatitis 11/13/2013  . Seizure ([place]) 11/13/2013  . Left bundle branch block 05/21/2013  . Dorsalgia 05/21/2013    Past Surgical History:  Procedure Laterality Date  . back surgery five times  04/2016  . CARPAL TUNNEL RELEASE    . HERNIA REPAIR    . JOINT REPLACEMENT    . knee replacement right    . right hip replacement    . rotator cuff surgery    . SPINE SURGERY      Prior to Admission medications   Medication Sig Start Date End Date Taking? Authorizing Provider  acetaminophen (TYLENOL) 325 MG tablet Take 2 tablets (650 mg total) by mouth every 6 (six) hours as needed for mild pain (or Fever >/= 101). 02/24/18   Nicholes Mango, MD  aspirin 81 MG chewable tablet Chew 81 mg by mouth daily.    [provider]  Cinnamon 500 MG capsule Take 500 mg by mouth 2 (two) times daily.    [provider]  etanercept (ENBREL SURECLICK) 50 MG/ML injection Inject 50 mg into the skin every Friday.     [provider]  folic acid (FOLVITE) Q000111Q MCG tablet Take 800 mcg by mouth daily.    [provider]  gabapentin (NEURONTIN) 300 MG capsule Take 1 capsule (300 mg total) by mouth 4 (four) times daily. QHS 01/05/19 02/04/19  Milinda Pointer, MD  hydroxychloroquine (PLAQUENIL) 200 MG tablet Take 400 mg by mouth at bedtime.    [provider]  lamoTRIgine (LAMICTAL) 200 MG tablet Take 200 mg by mouth 2 (two) times daily.    [provider]  lamoTRIgine (LAMICTAL) 25 MG tablet Take 25 mg by mouth 2 (two) times daily. 04/21/18   [provider]  magnesium oxide (MAG-OX) 400 MG tablet Take 400 mg by mouth daily.    [provider]  methotrexate (RHEUMATREX) 2.5 MG tablet TAKE 10 TABS BY MOUTH ONCE A WEEK 12/13/18   [provider]  nystatin-triamcinolone ointment (MYCOLOG) Apply 1  application topically 2 (two) times daily. 03/09/19   Vaillancourt, Aldona Bar, PA-C  omeprazole (PRILOSEC) 40 MG capsule Take 40 mg by mouth daily.     [provider]  polyethylene glycol (MIRALAX / GLYCOLAX) packet Take 17 g by mouth daily. 02/25/18   Nicholes Mango, MD  traZODone (DESYREL) 50 MG tablet Take 50 mg by mouth at bedtime.    [provider]    Allergies Cefoperazone, Penicillins, and Latex  Family History  Problem Relation Age of Onset  . Hypertension Other   . Hypertension Mother   . Dementia Mother   . Dementia Father   . Prostate cancer Neg Hx   . Bladder Cancer Neg Hx   . Kidney cancer Neg Hx     Social History Social History   Tobacco Use  . Smoking status: Former Smoker    Packs/day: 1.00    Years: 20.00    Pack years: 20.00    Types: Cigarettes  . Smokeless tobacco: Never Used  Substance Use Topics  . Alcohol use: No  . Drug use: Not on file    Review of Systems  Constitutional: No fever/chills Eyes:  No visual changes. ENT: No sore throat. Cardiovascular: Denies chest pain. Respiratory: Denies shortness of breath. Gastrointestinal: No abdominal pain.  No nausea, no vomiting.  No diarrhea.  No constipation. Genitourinary: Negative for dysuria. Musculoskeletal: Negative for back pain.  Positive for lower extremity swelling and pain. Skin: Positive for rash. Neurological: Negative for headaches, focal weakness or numbness.  ____________________________________________   PHYSICAL EXAM:  VITAL SIGNS: ED Triage Vitals  Enc Vitals Group     BP 03/31/19 1422 103/67     Pulse Rate 03/31/19 1422 87     Resp 03/31/19 1422 18     Temp 03/31/19 1422 (!) 97.4 F (36.3 C)     Temp Source 03/31/19 1422 Oral     SpO2 03/31/19 1422 97 %     Weight 03/31/19 1423 250 lb (113.4 kg)     Height 03/31/19 1423 6' (1.829 m)     Head Circumference --      Peak Flow --      Pain Score 03/31/19 1423 7     Pain Loc --      Pain Edu? --       Excl. in [place]? --     Constitutional: Alert and oriented. Eyes: Conjunctivae are normal. Head: Atraumatic. Nose: No congestion/rhinnorhea. Mouth/Throat: Mucous membranes are moist. Neck: Normal ROM Cardiovascular: Normal rate, regular rhythm. Grossly normal heart sounds. Respiratory: Normal respiratory effort.  No retractions. Lungs CTAB. Gastrointestinal: Soft and nontender. No distention. Genitourinary: deferred Musculoskeletal: 2+ pitting edema to bilateral lower extremities from knees down with fluid weeping from right lower extremity.  Diffuse erythema and warmth to bilateral lower extremities circumferentially, worst at the medial portion of his right lower extremity, where he also has some tenderness. Neurologic:  Normal speech and language. No gross focal neurologic deficits are appreciated. Skin:  Skin is warm, dry and intact. No rash noted. Psychiatric: Mood and affect are normal. Speech and behavior are normal.  ____________________________________________   LABS (all labs ordered are listed, but only abnormal results are displayed)  Labs Reviewed  BASIC METABOLIC PANEL - Abnormal; Notable for the following components:      Result Value   Potassium 3.3 (*)    Glucose, Bld 103 (*)    BUN 46 (*)    Creatinine, Ser 3.12 (*)    GFR calc non Af Amer 18 (*)    GFR calc Af Amer 21 (*)    All other components within normal limits  CBC WITH DIFFERENTIAL/PLATELET - Abnormal; Notable for the following components:   WBC 3.0 (*)    RBC 3.53 (*)    Hemoglobin 10.0 (*)    HCT 30.5 (*)    RDW 17.5 (*)    Neutro Abs 1.6 (*)    All other components within normal limits  CULTURE, BLOOD (ROUTINE X 2)  CULTURE, BLOOD (ROUTINE X 2)  SARS CORONAVIRUS 2 (TAT 6-24 HRS)  LACTIC ACID, PLASMA  URINALYSIS, COMPLETE (UACMP) WITH MICROSCOPIC  NA AND K (SODIUM & POTASSIUM), RAND UR  CREATININE, URINE, RANDOM  OSMOLALITY, URINE  SEDIMENTATION RATE  C-REACTIVE PROTEIN  OSMOLALITY      PROCEDURES  Procedure(s) performed (including Critical Care):  Procedures   ____________________________________________   INITIAL IMPRESSION / ASSESSMENT AND PLAN / ED COURSE       78 year old male with history of lymphedema presents to the ED with worsening pain, redness, and swelling to his bilateral lower extremities over the past 2 weeks.  He has been taking p.o. antibiotics for this  but has not seen any improvement, has now failed outpatient therapy.  He does not appear septic or systemically ill, vital signs reassuring.  Will start patient on vancomycin and check labs, anticipate admission for cellulitis.  Reviewed patient's visit from the [facility] emergency department, where he had ultrasound that was negative for DVT bilaterally.  He denies any chest pain or shortness of breath, doubt CHF or PE.  Patient turned over to Dr. Joan Mayans pending lab results and admission.      ____________________________________________   FINAL CLINICAL IMPRESSION(S) / ED DIAGNOSES  Final diagnoses:  Cellulitis of right lower extremity  Lymphedema     ED Discharge Orders    None       Note:  This document was prepared using Dragon voice recognition software and may include unintentional dictation errors.   [name] Divine, MD 03/31/19 1626

## 2019-03-31 NOTE — ED Notes (Signed)
Transport requested

## 2019-03-31 NOTE — H&P (Signed)
[facility] History and Physical   Patient: Jeremiah [name]. HRC:[id number]   PCP: Derinda Late, MD DOB: 05/16/1941   DOA: 03/31/2019   DOS: 03/31/2019   DOS: the patient was seen and examined on 03/31/2019  Patient coming from: The patient is coming from Home  Chief Complaint: Infection of the leg not getting better with antibiotics  HPI: Sherod Cisse. is a 78 y.o. male with Past medical history of rheumatoid arthritis on immunosuppressive therapy, seizures, HTN, chronic lymphedema. Patient presented with complaints of worsening leg infection. Patient was seen on 03/26/2019 in the ED at [facility] for cellulitis of the leg.  Patient was discharged on oral Cipro and clindamycin. Patient was taking all his medication up to this many days and was compliant with it. There was an area of infection and redness which was marked. Since last 2 days patient's pain was worsening and that area of redness across the marked line and therefore patient decided to come to the hospital. No nausea no vomiting no fever no chills no chest pain abdominal pain no diarrhea no constipation. Patient remains compliant with all his medication. Continue to take all his immunosuppressive medication throughout this infection course.  ED Course: Presents with above complaint.  And referred for admission for failure for outpatient therapy.  At his baseline ambulates with assistance independent for most of his ADL;  manages his medication on his own.  Review of Systems: as mentioned in the history of present illness.  All other systems reviewed and are negative.  Past Medical History:  Diagnosis Date  . Allergy   . Arthritis   . Hypertension   . Rheumatoid arthritis ([place])   . Seizures ([place])   . Sepsis ([place]) 05/23/2015   Past Surgical History:  Procedure Laterality Date  . back surgery five times  04/2016  . CARPAL TUNNEL RELEASE    . HERNIA REPAIR    . JOINT REPLACEMENT    . knee replacement  right    . right hip replacement    . rotator cuff surgery    . SPINE SURGERY     Social History:  reports that he has quit smoking. His smoking use included cigarettes. He has a 20.00 pack-year smoking history. He has never used smokeless tobacco. He reports that he does not drink alcohol. No history on file for drug.  Allergies  Allergen Reactions  . Cefoperazone Anaphylaxis  . Penicillins Anaphylaxis and Other (See Comments)    Has patient had a PCN reaction causing immediate rash, facial/tongue/throat swelling, SOB or lightheadedness with hypotension: Yes Has patient had a PCN reaction causing severe rash involving mucus membranes or skin necrosis: No Has patient had a PCN reaction that required hospitalization No Has patient had a PCN reaction occurring within the last 10 years: No If all of the above answers are "NO", then may proceed with Cephalosporin use.  . Latex Rash   Family history reviewed and not pertinent Family History  Problem Relation Age of Onset  . Hypertension Other   . Hypertension Mother   . Dementia Mother   . Dementia Father   . Prostate cancer Neg Hx   . Bladder Cancer Neg Hx   . Kidney cancer Neg Hx      Prior to Admission medications   Medication Sig Start Date End Date Taking? Authorizing Provider  aspirin 81 MG chewable tablet Chew 81 mg by mouth daily.   Yes [provider]  Cinnamon 500 MG capsule Take  500 mg by mouth 2 (two) times daily.   Yes [provider]  ciprofloxacin (CIPRO) 500 MG tablet Take 500 mg by mouth 2 (two) times daily. 03/27/19  Yes [provider]  clindamycin (CLEOCIN) 300 MG capsule Take 300 mg by mouth 4 (four) times daily. For 14 days 03/27/19 04/10/19 Yes [provider]  etanercept (ENBREL SURECLICK) 50 MG/ML injection Inject 50 mg into the skin every Friday.    Yes [provider]  folic acid (FOLVITE) 1 MG tablet Take 1 mg by mouth daily. 02/25/19  Yes [provider]   gabapentin (NEURONTIN) 300 MG capsule Take 1 capsule (300 mg total) by mouth 4 (four) times daily. QHS 01/05/19 03/31/19 Yes Milinda Pointer, MD  hydroxychloroquine (PLAQUENIL) 200 MG tablet Take 400 mg by mouth at bedtime.   Yes [provider]  lamoTRIgine (LAMICTAL) 200 MG tablet Take 200 mg by mouth 2 (two) times daily.   Yes [provider]  lamoTRIgine (LAMICTAL) 25 MG tablet Take 25 mg by mouth 2 (two) times daily. 04/21/18  Yes [provider]  magnesium oxide (MAG-OX) 400 MG tablet Take 400 mg by mouth daily.   Yes [provider]  methotrexate (RHEUMATREX) 2.5 MG tablet TAKE 10 TABS BY MOUTH ONCE A WEEK 12/13/18  Yes [provider]  nystatin-triamcinolone ointment (MYCOLOG) Apply 1 application topically 2 (two) times daily. 03/09/19  Yes Vaillancourt, Samantha, PA-C  omeprazole (PRILOSEC) 40 MG capsule Take 40 mg by mouth daily.    Yes [provider]  traZODone (DESYREL) 50 MG tablet Take 50 mg by mouth at bedtime.   Yes [provider]  acetaminophen (TYLENOL) 325 MG tablet Take 2 tablets (650 mg total) by mouth every 6 (six) hours as needed for mild pain (or Fever >/= 101). 02/24/18   Gouru, Illene Silver, MD  polyethylene glycol (MIRALAX / GLYCOLAX) packet Take 17 g by mouth daily. 02/25/18   Nicholes Mango, MD    Physical Exam: Vitals:   03/31/19 1600 03/31/19 1700 03/31/19 1726 03/31/19 1815  BP: 111/66 (!) 108/58  125/74  Pulse:  93  (!) 103  Resp: '11 10  17  '$ Temp:   97.8 F (36.6 C) (!) 97.5 F (36.4 C)  TempSrc:   Oral Oral  SpO2:  95%  100%  Weight:    119.7 kg  Height:    6' (1.829 m)    General: alert and oriented to time, place, and person. Appear in mild distress, affect appropriate Eyes: PERRL, Conjunctiva normal ENT: Oral Mucosa Clear, moist  Neck: no JVD, no Abnormal Mass Or lumps Cardiovascular: S1 and S2 Present, no Murmur, peripheral pulses symmetrical Respiratory: good respiratory effort, Bilateral  Air entry equal and Decreased, no signs of accessory muscle use, Clear to Auscultation, no Crackles, no wheezes Abdomen: Bowel Sound present, Soft and no tenderness, no hernia Skin: See the images Extremities: bilateral  Pedal edema, no calf tenderness Neurologic: without any new focal findings Gait not checked due to patient safety concerns         Data Reviewed: I have personally reviewed and interpreted labs, imaging as discussed below.  CBC: Recent Labs  Lab 03/31/19 1440  WBC 3.0*  NEUTROABS 1.6*  HGB 10.0*  HCT 30.5*  MCV 86.4  PLT 128   Basic Metabolic Panel: Recent Labs  Lab 03/31/19 1440  NA 139  K 3.3*  CL 99  CO2 28  GLUCOSE 103*  BUN 46*  CREATININE 3.12*  CALCIUM 8.9  GFR: Estimated Creatinine Clearance: 26.5 mL/min (A) (by C-G formula based on SCr of 3.12 mg/dL (H)). Liver Function Tests: No results for input(s): AST, ALT, ALKPHOS, BILITOT, PROT, ALBUMIN in the last 168 hours. No results for input(s): LIPASE, AMYLASE in the last 168 hours. No results for input(s): AMMONIA in the last 168 hours. Coagulation Profile: No results for input(s): INR, PROTIME in the last 168 hours. Cardiac Enzymes: No results for input(s): CKTOTAL, CKMB, CKMBINDEX, TROPONINI in the last 168 hours. BNP (last 3 results) No results for input(s): PROBNP in the last 8760 hours. HbA1C: No results for input(s): HGBA1C in the last 72 hours. CBG: No results for input(s): GLUCAP in the last 168 hours. Lipid Profile: No results for input(s): CHOL, HDL, LDLCALC, TRIG, CHOLHDL, LDLDIRECT in the last 72 hours. Thyroid Function Tests: No results for input(s): TSH, T4TOTAL, FREET4, T3FREE, THYROIDAB in the last 72 hours. Anemia Panel: No results for input(s): VITAMINB12, FOLATE, FERRITIN, TIBC, IRON, RETICCTPCT in the last 72 hours. Urine analysis:    Component Value Date/Time   COLORURINE AMBER (A) 02/20/2018 1317   APPEARANCEUR Clear 02/19/2019 1438   LABSPEC 1.026  02/20/2018 1317   PHURINE 6.0 02/20/2018 1317   GLUCOSEU Negative 02/19/2019 1438   HGBUR NEGATIVE 02/20/2018 1317   BILIRUBINUR Negative 02/19/2019 [place] 02/20/2018 1317   PROTEINUR Negative 02/19/2019 1438   PROTEINUR NEGATIVE 02/20/2018 1317   NITRITE Negative 02/19/2019 1438   NITRITE NEGATIVE 02/20/2018 1317   LEUKOCYTESUR Trace (A) 02/19/2019 1438    Radiological Exams on Admission: No results found.  I reviewed all nursing notes, pharmacy notes, vitals, pertinent old records.  Assessment/Plan 1.  Cellulitis. Failure of outpatient therapy. Bilateral lower extremity. Continue with IV vancomycin for now. Follow-up on cultures with Follow-up on ESR and CRP. Monitor renal function while the patient is on this medication given his left early acute kidney injury.  2.  Acute kidney injury. Etiology not clear. Check CK. Check ESR and CRP as well as other work-up ultrasound renal. Currently avoiding IV hydration as the patient already appears to have weeping legs.  Use IV albumin for now and monitor. Further treatment depending on the work-up.  3.  Rheumatoid arthritis. Patient is on methotrexate, Enbrel, Plaquenil. We will hold all 3 medications for now. Monitor.  4.  Neuropathy. Continue gabapentin.  5.  History of seizures. Continue Lamictal.  6.  Pressure ulcers.  POA.  Bilateral buttock as well as mid sacrum.  Also bilateral heel. Problem boots as well as foam dressing. Wound care consulted.  Nutrition: Cardiac diet DVT Prophylaxis: Subcutaneous Heparin   Advance goals of care discussion: Full code   Consults: none   Family Communication: family was present at bedside, at the time of interview.  Opportunity was given to ask question and all questions were answered satisfactorily.  Disposition: Admitted as observation, med-surge unit. Likely to be discharged home, in 1-2 days.  I have discussed plan of care as described above with RN  and patient/family.   Author: Berle Mull, MD [facility] 03/31/2019 7:21 PM   To reach On-call, see care teams to locate the attending and reach out to them via [email or web address]. If 7PM-7AM, please contact night-coverage If you still have difficulty reaching the attending provider, please page the [facility] (Director on Call) for [facility] on amion for assistance.

## 2019-03-31 NOTE — ED Notes (Signed)
Pt complaining of burning to left arm where IV ABX are infusing and mild redness. Paused and EDP informed. Orders received to pause ABX and given benadryl for approx 30 minutes then restart ABX.

## 2019-03-31 NOTE — ED Notes (Signed)
ED TO INPATIENT HANDOFF REPORT  ED Nurse Name and Phone #: ally [phone]  S Name/Age/Gender Jeremiah [name] 77 y.o. male Room/Bed: ED02A/ED02A  Code Status   Code Status: Prior  Home/SNF/Other Home Patient oriented to: self, place, time and situation Is this baseline? Yes   Triage Complete: Triage complete  Chief Complaint edema ems  Triage Note Pt from home via ems- with reports that pt was seen 5 days ago at [facility] for same- BLE swelling and redness noted. Pt denies fever. Placed on Cipro and Clindamycin 5 days ago.    Allergies Allergies  Allergen Reactions  . Cefoperazone Anaphylaxis  . Penicillins Anaphylaxis and Other (See Comments)    Has patient had a PCN reaction causing immediate rash, facial/tongue/throat swelling, SOB or lightheadedness with hypotension: Yes Has patient had a PCN reaction causing severe rash involving mucus membranes or skin necrosis: No Has patient had a PCN reaction that required hospitalization No Has patient had a PCN reaction occurring within the last 10 years: No If all of the above answers are "NO", then may proceed with Cephalosporin use.  . Latex Rash    Level of Care/Admitting Diagnosis ED Disposition    ED Disposition Condition [facility] Area: [place] [[id number]]  Level of Care: Med-Surg [16]  Covid Evaluation: Asymptomatic Screening Protocol (No Symptoms)  Diagnosis: Cellulitis [676195]  Admitting Physician: Lavina Hamman [[id number]]  Attending Physician: Lavina Hamman [[id number]]  PT Class (Do Not Modify): Observation [104]  PT Acc Code (Do Not Modify): Observation [10022]       B Medical/Surgery History Past Medical History:  Diagnosis Date  . Allergy   . Arthritis   . Hypertension   . Rheumatoid arthritis ([place])   . Seizures ([place])   . Sepsis ([place]) 05/23/2015   Past Surgical History:  Procedure Laterality Date  . back surgery five times  04/2016  . CARPAL TUNNEL RELEASE    .  HERNIA REPAIR    . JOINT REPLACEMENT    . knee replacement right    . right hip replacement    . rotator cuff surgery    . SPINE SURGERY       A IV Location/Drains/Wounds Patient Lines/Drains/Airways Status   Active Line/Drains/Airways    Name:   Placement date:   Placement time:   Site:   Days:   Peripheral IV 03/31/19 Left Antecubital   03/31/19    1453    Antecubital   less than 1   Peripheral IV 03/31/19 Left Hand   03/31/19    1454    Hand   less than 1   Urethral Catheter Amanda RN   02/22/18    1228    -   402          Intake/Output Last 24 hours No intake or output data in the 24 hours ending 03/31/19 1657  Labs/Imaging Results for orders placed or performed during the hospital encounter of 03/31/19 (from the past 48 hour(s))  Basic metabolic panel     Status: Abnormal   Collection Time: 03/31/19  2:40 PM  Result Value Ref Range   Sodium 139 135 - 145 mmol/L   Potassium 3.3 (L) 3.5 - 5.1 mmol/L   Chloride 99 98 - 111 mmol/L   CO2 28 22 - 32 mmol/L   Glucose, Bld 103 (H) 70 - 99 mg/dL   BUN 46 (H) 8 - 23 mg/dL   Creatinine, Ser 3.12 (H)  0.61 - 1.24 mg/dL   Calcium 8.9 8.9 - 10.3 mg/dL   GFR calc non Af Amer 18 (L) >60 mL/min   GFR calc Af Amer 21 (L) >60 mL/min   Anion gap 12 5 - 15    Comment: Performed at [facility], [address] [place], [place] [address]  CBC with Differential     Status: Abnormal   Collection Time: 03/31/19  2:40 PM  Result Value Ref Range   WBC 3.0 (L) 4.0 - 10.5 K/uL   RBC 3.53 (L) 4.22 - 5.81 MIL/uL   Hemoglobin 10.0 (L) 13.0 - 17.0 g/dL   HCT 30.5 (L) 39.0 - 52.0 %   MCV 86.4 80.0 - 100.0 fL   MCH 28.3 26.0 - 34.0 pg   MCHC 32.8 30.0 - 36.0 g/dL   RDW 17.5 (H) 11.5 - 15.5 %   Platelets 206 150 - 400 K/uL   nRBC 0.0 0.0 - 0.2 %   Neutrophils Relative % 54 %   Neutro Abs 1.6 (L) 1.7 - 7.7 K/uL   Lymphocytes Relative 27 %   Lymphs Abs 0.8 0.7 - 4.0 K/uL   Monocytes Relative 6 %   Monocytes Absolute 0.2 0.1 - 1.0  K/uL   Eosinophils Relative 12 %   Eosinophils Absolute 0.4 0.0 - 0.5 K/uL   Basophils Relative 1 %   Basophils Absolute 0.0 0.0 - 0.1 K/uL   Immature Granulocytes 0 %   Abs Immature Granulocytes 0.01 0.00 - 0.07 K/uL    Comment: Performed at [facility], [address] [place], [place] [address]  Lactic acid, plasma     Status: None   Collection Time: 03/31/19  2:40 PM  Result Value Ref Range   Lactic Acid, Venous 0.9 0.5 - 1.9 mmol/L    Comment: Performed at [facility], [address] [place], [place] [address]   No results found.  Pending Labs Unresulted Labs (From admission, onward)    Start     Ordered   03/31/19 1626  Na and K (sodium & potassium), rand urine  Once,   STAT     03/31/19 1625   03/31/19 1626  Creatinine, urine, random  Once,   STAT     03/31/19 1625   03/31/19 1626  Osmolality, urine  Once,   STAT     03/31/19 1625   03/31/19 1626  ESR  Once,   STAT     03/31/19 1625   03/31/19 1626  C-reactive protein  Once,   STAT     03/31/19 1625   03/31/19 1626  Osmolality  Once,   STAT     03/31/19 1625   03/31/19 1625  Urinalysis, Complete w Microscopic  Once,   STAT     03/31/19 1625   03/31/19 1448  SARS CORONAVIRUS 2 (TAT 6-24 HRS) Nasopharyngeal Nasopharyngeal Swab  (Asymptomatic/Tier 2 Patients Labs)  Once,   STAT    Question Answer Comment  Is this test for diagnosis or screening Screening   Symptomatic for COVID-19 as defined by CDC No   Hospitalized for COVID-19 No   Admitted to ICU for COVID-19 No   Previously tested for COVID-19 No   Resident in a congregate (group) care setting No   Employed in healthcare setting No      03/31/19 1448   03/31/19 1433  Culture, blood (routine x 2)  BLOOD CULTURE X 2,   STAT     03/31/19 1432  Vitals/Pain Today's Vitals   03/31/19 1430 03/31/19 1530 03/31/19 1600 03/31/19 1607  BP: 97/73 119/78 111/66   Pulse: 84 85    Resp: _0 Temp:      TempSrc:      SpO2: 92%  99%    Weight:      Height:      PainSc:    6     Isolation Precautions No active isolations  Medications Medications  vancomycin (VANCOCIN) IVPB 1000 mg/200 mL premix (0 mg Intravenous Stopped 03/31/19 1643)  diphenhydrAMINE (BENADRYL) injection 25 mg (25 mg Intravenous Given 03/31/19 1523)  morphine 4 MG/ML injection 4 mg (4 mg Intravenous Given 03/31/19 1524)  sodium chloride 0.9 % bolus 500 mL (500 mLs Intravenous New Bag/Given 03/31/19 1609)    Mobility manual wheelchair High fall risk   Focused Assessments Bilateral lower leg severe edema and redness   R Recommendations: See Admitting Provider Note  Report given to:   Additional Notes:

## 2019-03-31 NOTE — ED Notes (Addendum)
Pt taken to hospital room by transport services. accompanied by wife and all of belongings.

## 2019-03-31 NOTE — ED Triage Notes (Signed)
Pt from home via ems- with reports that pt was seen 5 days ago at [facility] for same- BLE swelling and redness noted. Pt denies fever. Placed on Cipro and Clindamycin 5 days ago.

## 2019-04-01 ENCOUNTER — Observation Stay: Payer: Medicare Other

## 2019-04-01 DIAGNOSIS — G894 Chronic pain syndrome: Secondary | ICD-10-CM | POA: Diagnosis present

## 2019-04-01 DIAGNOSIS — I890 Lymphedema, not elsewhere classified: Secondary | ICD-10-CM | POA: Diagnosis not present

## 2019-04-01 DIAGNOSIS — L89322 Pressure ulcer of left buttock, stage 2: Secondary | ICD-10-CM | POA: Diagnosis present

## 2019-04-01 DIAGNOSIS — M1990 Unspecified osteoarthritis, unspecified site: Secondary | ICD-10-CM | POA: Diagnosis not present

## 2019-04-01 DIAGNOSIS — I872 Venous insufficiency (chronic) (peripheral): Secondary | ICD-10-CM | POA: Diagnosis present

## 2019-04-01 DIAGNOSIS — K121 Other forms of stomatitis: Secondary | ICD-10-CM | POA: Diagnosis not present

## 2019-04-01 DIAGNOSIS — R5383 Other fatigue: Secondary | ICD-10-CM | POA: Diagnosis not present

## 2019-04-01 DIAGNOSIS — I10 Essential (primary) hypertension: Secondary | ICD-10-CM | POA: Diagnosis not present

## 2019-04-01 DIAGNOSIS — R5081 Fever presenting with conditions classified elsewhere: Secondary | ICD-10-CM | POA: Diagnosis not present

## 2019-04-01 DIAGNOSIS — I878 Other specified disorders of veins: Secondary | ICD-10-CM | POA: Diagnosis not present

## 2019-04-01 DIAGNOSIS — K1230 Oral mucositis (ulcerative), unspecified: Secondary | ICD-10-CM | POA: Diagnosis not present

## 2019-04-01 DIAGNOSIS — L03119 Cellulitis of unspecified part of limb: Secondary | ICD-10-CM | POA: Diagnosis not present

## 2019-04-01 DIAGNOSIS — L89159 Pressure ulcer of sacral region, unspecified stage: Secondary | ICD-10-CM | POA: Diagnosis not present

## 2019-04-01 DIAGNOSIS — B372 Candidiasis of skin and nail: Secondary | ICD-10-CM | POA: Diagnosis present

## 2019-04-01 DIAGNOSIS — G9341 Metabolic encephalopathy: Secondary | ICD-10-CM | POA: Diagnosis present

## 2019-04-01 DIAGNOSIS — L89309 Pressure ulcer of unspecified buttock, unspecified stage: Secondary | ICD-10-CM | POA: Diagnosis not present

## 2019-04-01 DIAGNOSIS — D72829 Elevated white blood cell count, unspecified: Secondary | ICD-10-CM | POA: Diagnosis not present

## 2019-04-01 DIAGNOSIS — D61818 Other pancytopenia: Secondary | ICD-10-CM | POA: Diagnosis not present

## 2019-04-01 DIAGNOSIS — Z66 Do not resuscitate: Secondary | ICD-10-CM | POA: Diagnosis present

## 2019-04-01 DIAGNOSIS — E119 Type 2 diabetes mellitus without complications: Secondary | ICD-10-CM | POA: Diagnosis present

## 2019-04-01 DIAGNOSIS — F0390 Unspecified dementia without behavioral disturbance: Secondary | ICD-10-CM | POA: Diagnosis present

## 2019-04-01 DIAGNOSIS — N179 Acute kidney failure, unspecified: Secondary | ICD-10-CM | POA: Diagnosis present

## 2019-04-01 DIAGNOSIS — Z20828 Contact with and (suspected) exposure to other viral communicable diseases: Secondary | ICD-10-CM | POA: Diagnosis present

## 2019-04-01 DIAGNOSIS — F05 Delirium due to known physiological condition: Secondary | ICD-10-CM | POA: Diagnosis present

## 2019-04-01 DIAGNOSIS — Z96641 Presence of right artificial hip joint: Secondary | ICD-10-CM | POA: Diagnosis present

## 2019-04-01 DIAGNOSIS — L03116 Cellulitis of left lower limb: Secondary | ICD-10-CM | POA: Diagnosis not present

## 2019-04-01 DIAGNOSIS — L89312 Pressure ulcer of right buttock, stage 2: Secondary | ICD-10-CM | POA: Diagnosis present

## 2019-04-01 DIAGNOSIS — L03115 Cellulitis of right lower limb: Secondary | ICD-10-CM | POA: Diagnosis not present

## 2019-04-01 DIAGNOSIS — E538 Deficiency of other specified B group vitamins: Secondary | ICD-10-CM | POA: Diagnosis present

## 2019-04-01 DIAGNOSIS — D696 Thrombocytopenia, unspecified: Secondary | ICD-10-CM | POA: Diagnosis not present

## 2019-04-01 DIAGNOSIS — E46 Unspecified protein-calorie malnutrition: Secondary | ICD-10-CM | POA: Diagnosis not present

## 2019-04-01 DIAGNOSIS — D61811 Other drug-induced pancytopenia: Secondary | ICD-10-CM | POA: Diagnosis not present

## 2019-04-01 DIAGNOSIS — T451X5A Adverse effect of antineoplastic and immunosuppressive drugs, initial encounter: Secondary | ICD-10-CM | POA: Diagnosis not present

## 2019-04-01 DIAGNOSIS — G40909 Epilepsy, unspecified, not intractable, without status epilepticus: Secondary | ICD-10-CM | POA: Diagnosis present

## 2019-04-01 DIAGNOSIS — R1310 Dysphagia, unspecified: Secondary | ICD-10-CM | POA: Diagnosis not present

## 2019-04-01 DIAGNOSIS — Z515 Encounter for palliative care: Secondary | ICD-10-CM | POA: Diagnosis present

## 2019-04-01 DIAGNOSIS — Z96651 Presence of right artificial knee joint: Secondary | ICD-10-CM | POA: Diagnosis present

## 2019-04-01 DIAGNOSIS — R569 Unspecified convulsions: Secondary | ICD-10-CM | POA: Diagnosis not present

## 2019-04-01 DIAGNOSIS — Z792 Long term (current) use of antibiotics: Secondary | ICD-10-CM | POA: Diagnosis not present

## 2019-04-01 DIAGNOSIS — M069 Rheumatoid arthritis, unspecified: Secondary | ICD-10-CM | POA: Diagnosis not present

## 2019-04-01 DIAGNOSIS — D709 Neutropenia, unspecified: Secondary | ICD-10-CM | POA: Diagnosis not present

## 2019-04-01 DIAGNOSIS — G629 Polyneuropathy, unspecified: Secondary | ICD-10-CM | POA: Diagnosis present

## 2019-04-01 LAB — COMPREHENSIVE METABOLIC PANEL
ALT: 29 U/L (ref 0–44)
AST: 37 U/L (ref 15–41)
Albumin: 2.8 g/dL — ABNORMAL LOW (ref 3.5–5.0)
Alkaline Phosphatase: 66 U/L (ref 38–126)
Anion gap: 8 (ref 5–15)
BUN: 39 mg/dL — ABNORMAL HIGH (ref 8–23)
CO2: 28 mmol/L (ref 22–32)
Calcium: 8.4 mg/dL — ABNORMAL LOW (ref 8.9–10.3)
Chloride: 104 mmol/L (ref 98–111)
Creatinine, Ser: 2.12 mg/dL — ABNORMAL HIGH (ref 0.61–1.24)
GFR calc Af Amer: 34 mL/min — ABNORMAL LOW (ref >60)
GFR calc non Af Amer: 29 mL/min — ABNORMAL LOW (ref >60)
Glucose, Bld: 94 mg/dL (ref 70–99)
Potassium: 3.2 mmol/L — ABNORMAL LOW (ref 3.5–5.1)
Sodium: 140 mmol/L (ref 135–145)
Total Bilirubin: 0.7 mg/dL (ref 0.3–1.2)
Total Protein: 5.4 g/dL — ABNORMAL LOW (ref 6.5–8.1)

## 2019-04-01 LAB — CBC
HCT: 26 % — ABNORMAL LOW (ref 39.0–52.0)
Hemoglobin: 8.5 g/dL — ABNORMAL LOW (ref 13.0–17.0)
MCH: 27.9 pg (ref 26.0–34.0)
MCHC: 32.7 g/dL (ref 30.0–36.0)
MCV: 85.2 fL (ref 80.0–100.0)
Platelets: 165 10*3/uL (ref 150–400)
RBC: 3.05 MIL/uL — ABNORMAL LOW (ref 4.22–5.81)
RDW: 17.2 % — ABNORMAL HIGH (ref 11.5–15.5)
WBC: 2.9 10*3/uL — ABNORMAL LOW (ref 4.0–10.5)
nRBC: 0 % (ref 0.0–0.2)

## 2019-04-01 LAB — SARS CORONAVIRUS 2 (TAT 6-24 HRS): SARS Coronavirus 2: NEGATIVE

## 2019-04-01 MED ORDER — POTASSIUM CHLORIDE CRYS ER 20 MEQ PO TBCR
40.0000 meq | EXTENDED_RELEASE_TABLET | Freq: Once | ORAL | Status: AC
  Administered 2019-04-01: 40 meq via ORAL
  Filled 2019-04-01: qty 2

## 2019-04-01 MED ORDER — VANCOMYCIN HCL 1.25 G IV SOLR
1250.0000 mg | INTRAVENOUS | Status: DC
  Administered 2019-04-02: 09:00:00 1250 mg via INTRAVENOUS
  Filled 2019-04-01 (×2): qty 1250

## 2019-04-01 MED ORDER — GABAPENTIN 300 MG PO CAPS
300.0000 mg | ORAL_CAPSULE | Freq: Three times a day (TID) | ORAL | Status: DC
  Administered 2019-04-01 – 2019-04-03 (×6): 300 mg via ORAL
  Filled 2019-04-01 (×6): qty 1

## 2019-04-01 MED ORDER — CHLORHEXIDINE GLUCONATE CLOTH 2 % EX PADS
6.0000 | MEDICATED_PAD | Freq: Every day | CUTANEOUS | Status: DC
  Administered 2019-04-01 – 2019-04-14 (×12): 6 via TOPICAL

## 2019-04-01 NOTE — Consult Note (Signed)
[place] Nurse wound consult note Reason for Consult: Pressure injuries to bilateral buttocks (moisture and pressure and stage 2 sacral wound.  Cellulitis to bilateral lower legs and chronic skin changes consistent with venous insufficiency, managing with antibiotics.   Wound type:Pressure to sacrum, moisture to bilateral gluteal folds.  Is incontinent of urine at home.  Foley in at this time and skin is clean and dry.  Patient is complaining that sacral dressing is not comfortable to him, Pressure Injury POA: Yes Measurement: sacrum 2 cm x 1 cm x 0.1 cm  Bilateral gluteal folds have mirror image 1 cm x 1 cm x 0.1 cm  Partial thickness wounds Wound [id number] and moist Drainage (amount, consistency, odor) minimal serosanguinous drainage  No odor.  Periwound:intact dry skin Dressing procedure/placement/frequency: Barrier cream twice daily and PRN soilage.  Contact with dermatherapy underpads and linens will wick moisture and improve skin microclimate to promote healing.   Will not follow at this time.  Please re-consult if needed.  Domenic Moras MSN, RN, FNP-BC CWON Wound, Ostomy, Continence Nurse Pager [phone]

## 2019-04-01 NOTE — Consult Note (Addendum)
Pharmacy Antibiotic Note  Jeremiah [name]. is a 78 y.o. male admitted on 03/31/2019 with cellulitis. Past medical history significant for lymphedema, hypertension, diabetes, peripheral artery disease. Patient with leg pain and swelling in lower extremities over last two weeks with worsening erythema. He was recently evaluated at [facility] ED approximately one week ago and discharged on ciprofloxacin and clindamycin but symptoms have progressed despite antibiotics. Pharmacy has been consulted for vancomycin dosing.   Patient has been ordered vancomycin 1 g + 1 g for a total loading dose of 2 g. Scr on admission is elevated to 3.12 and per [facility] records it was 1.4 on 11/5. Other labs significant for leukopenia with WBC of 2.9 and a mild neutropenia on differential. Patient remains afebrile.   Allergies: PCN, Cefoperazone   - noted to be severe allergies; per chart review patient has not had any cephalosporins or PCNs   Plan: Vancomycin 1250 mg IV q36h.  Goal AUC 400-550. Expected AUC: 464.9, trough 10.8 SCr used: 2.12  Continue to monitor daily Scr per protocol and adjust antibiotic dosing as indicated.  Height: 6' (182.9 cm) Weight: 259 lb 14.8 oz (117.9 kg) IBW/kg (Calculated) : 77.6  Temp (24hrs), Avg:97.9 F (36.6 C), Min:97.4 F (36.3 C), Max:98.4 F (36.9 C)  Recent Labs  Lab 03/31/19 1440 04/01/19 0536  WBC 3.0* 2.9*  CREATININE 3.12* 2.12*  LATICACIDVEN 0.9  --     Estimated Creatinine Clearance: 38.7 mL/min (A) (by C-G formula based on SCr of 2.12 mg/dL (H)).    Allergies  Allergen Reactions  . Cefoperazone Anaphylaxis  . Penicillins Anaphylaxis and Other (See Comments)    Has patient had a PCN reaction causing immediate rash, facial/tongue/throat swelling, SOB or lightheadedness with hypotension: Yes Has patient had a PCN reaction causing severe rash involving mucus membranes or skin necrosis: No Has patient had a PCN reaction that required hospitalization No Has  patient had a PCN reaction occurring within the last 10 years: No If all of the above answers are "NO", then may proceed with Cephalosporin use.  . Latex Rash    Antimicrobials this admission: Vancomycin 11/10 >>   Dose adjustments this admission: n/a  Microbiology results: 11/10 BCx: pending 11/10 COVID-19: negative   Thank you for allowing pharmacy to be a part of this patient's care.  [place] Resident 04/01/2019 8:34 AM

## 2019-04-01 NOTE — Progress Notes (Signed)
PROGRESS NOTE    Jeremiah [name].  [id number] DOB: 1941/03/04 DOA: 03/31/2019 PCP: Derinda Late, MD   Brief Narrative:  Patient is a 78 year old white male with a past no history of rheumatoid arthritis on immunosuppressive therapy, seizures, hypertension, chronic lymphedema who presented with worsening leg infection.  Patient was seen at the ER at [facility] treated with outpatient medications but because of worsening clinical presentation he came to the ER and was admitted for further eval and treatment.   Assessment & Plan:   Active Problems:   Cellulitis   Cellulitis superimposed on venous stasis changes associated lymphedema.  Continue with broad-spectrum antibiotics for now as patient is failed outpatient.  Follow-up cultures, CRP elevated as expected, monitor renal function closely which has improved from admission creatinine 3.12 yesterday to 2.12 today.  Acute kidney injury: Suspect was likely prerenal in nature,patient received IV albumin, improving, will monitor closely especially in the setting of vancomycin  Rheumatoid arthritis: Agree with holding immunosuppressants currently  Neuropathy: Continue gabapentin  Seizure disorder: Continue Lamictal no evidence of breakthrough seizures  Pressure ulcers present on admission.  These were bilateral buttock as well as mid sacrum: Wound care was consulted follow-up their recommendations continue with heel boots and foam dressing  DVT prophylaxis: Heparin SQ  Code Status: Full code    Code Status Orders  (From admission, onward)         Start     Ordered   03/31/19 1819  Full code  Continuous     03/31/19 1819        Code Status History    Date Active Date Inactive Code Status Order ID Comments User Context   02/20/2018 2220 02/24/2018 2019 Full Code [id number]  Lance Coon, MD ED   05/23/2015 2221 05/26/2015 1741 Full Code [id number]  Hower, Aaron Mose, MD ED   Advance Care Planning Activity     Family Communication:  called wife 11/11 Disposition Plan:   Patient remained in the hospital additional day for monitoring of renal function secondary to acute kidney injury, continue IV antibiotics, and follow-up of cultures. Consults called: None Admission status: Inpatient   Consultants:   None  Procedures:  US Renal  Result Date: 04/01/2019 CLINICAL DATA:  Acute kidney injury EXAM: RENAL / URINARY TRACT ULTRASOUND COMPLETE COMPARISON:  None. FINDINGS: Right Kidney: Renal measurements: 11.1 x 4.3 x 4.5 cm = volume: 110 mL . Echogenicity within normal limits. No mass or hydronephrosis visualized. Left Kidney: Renal measurements: 11.3 x 5.6 x 4.7 cm = volume: 155 mL. Echogenicity within normal limits. There is a thin walled anechoic simple cyst at the mid to inferior pole the left kidney measuring 2.1 x 1.6 x 2.2 cm. No solid mass or hydronephrosis visualized. Bladder: Decompressed with Foley catheter in place. Other: None. IMPRESSION: Negative for obstructive uropathy. Electronically Signed   By: Davina Poke M.D.   On: 04/01/2019 10:11     Antimicrobials:   Vancomycin day #2   Subjective: No acute events overnight Patient made hemodynamically stable reports lower extremity still with mild to moderate pain  Objective: Vitals:   03/31/19 1815 03/31/19 2010 04/01/19 0500 04/01/19 0515  BP: 125/74 105/80  (!) 142/71  Pulse: (!) 103 (!) 110  94  Resp: 17 20  20   Temp: (!) 97.5 F (36.4 C) 98.4 F (36.9 C)  98.4 F (36.9 C)  TempSrc: Oral Oral  Oral  SpO2: 100% 98%  96%  Weight: 119.7 kg  117.9 kg   Height:  6' (1.829 m)       Intake/Output Summary (Last 24 hours) at 04/01/2019 1024 Last data filed at 04/01/2019 0956 Gross per 24 hour  Intake 683.7 ml  Output 1250 ml  Net -566.3 ml   Filed Weights   03/31/19 1423 03/31/19 1815 04/01/19 0500  Weight: 113.4 kg 119.7 kg 117.9 kg    Examination:  General exam: Appears calm and comfortable  Respiratory system: Clear to auscultation.  Respiratory effort normal. Cardiovascular system: S1 & S2 heard, RRR. No JVD, murmurs, rubs, gallops or clicks. No pedal edema. Gastrointestinal system: Abdomen is nondistended, soft and nontender. No organomegaly or masses felt. Normal bowel sounds heard. Central nervous system: Alert and oriented. No focal neurological deficits. Extremities: Warm well perfused, neurovascularly intact, decreased lower extremity sensation but is intact Skin: Warmth and erythema bilateral lower extremities consistent with venous stasis, asymmetrically worse with a superimposed cellulitis  psychiatry: Judgement and insight impaired consistent with chronic cognitive deficits l. Mood & affect flat    Data Reviewed: I have personally reviewed following labs and imaging studies  CBC: Recent Labs  Lab 03/31/19 1440 04/01/19 0536  WBC 3.0* 2.9*  NEUTROABS 1.6*  --   HGB 10.0* 8.5*  HCT 30.5* 26.0*  MCV 86.4 85.2  PLT 206 [id number]   Basic Metabolic Panel: Recent Labs  Lab 03/31/19 1440 04/01/19 0536  NA 139 140  K 3.3* 3.2*  CL 99 104  CO2 28 28  GLUCOSE 103* 94  BUN 46* 39*  CREATININE 3.12* 2.12*  CALCIUM 8.9 8.4*   GFR: Estimated Creatinine Clearance: 38.7 mL/min (A) (by C-G formula based on SCr of 2.12 mg/dL (H)). Liver Function Tests: Recent Labs  Lab 04/01/19 0536  AST 37  ALT 29  ALKPHOS 66  BILITOT 0.7  PROT 5.4*  ALBUMIN 2.8*   No results for input(s): LIPASE, AMYLASE in the last 168 hours. No results for input(s): AMMONIA in the last 168 hours. Coagulation Profile: No results for input(s): INR, PROTIME in the last 168 hours. Cardiac Enzymes: No results for input(s): CKTOTAL, CKMB, CKMBINDEX, TROPONINI in the last 168 hours. BNP (last 3 results) No results for input(s): PROBNP in the last 8760 hours. HbA1C: No results for input(s): HGBA1C in the last 72 hours. CBG: No results for input(s): GLUCAP in the last 168 hours. Lipid Profile: No results for input(s): CHOL, HDL,  LDLCALC, TRIG, CHOLHDL, LDLDIRECT in the last 72 hours. Thyroid Function Tests: No results for input(s): TSH, T4TOTAL, FREET4, T3FREE, THYROIDAB in the last 72 hours. Anemia Panel: No results for input(s): VITAMINB12, FOLATE, FERRITIN, TIBC, IRON, RETICCTPCT in the last 72 hours. Sepsis Labs: Recent Labs  Lab 03/31/19 1440  LATICACIDVEN 0.9    Recent Results (from the past 240 hour(s))  SARS CORONAVIRUS 2 (TAT 6-24 HRS) Nasopharyngeal Nasopharyngeal Swab     Status: None   Collection Time: 03/31/19  2:51 PM   Specimen: Nasopharyngeal Swab  Result Value Ref Range Status   SARS Coronavirus 2 NEGATIVE NEGATIVE Final    Comment: (NOTE) SARS-CoV-2 target nucleic acids are NOT DETECTED. The SARS-CoV-2 RNA is generally detectable in upper and lower respiratory specimens during the acute phase of infection. Negative results do not preclude SARS-CoV-2 infection, do not rule out co-infections with other pathogens, and should not be used as the sole basis for treatment or other patient management decisions. Negative results must be combined with clinical observations, patient history, and epidemiological information. The expected result is Negative. Fact Sheet for Patients: SugarRoll.be Fact Sheet  for Healthcare Providers: https://www.woods-mathews.com/ This test is not yet approved or cleared by the [place] and  has been authorized for detection and/or diagnosis of SARS-CoV-2 by FDA under an Emergency Use Authorization (EUA). This EUA will remain  in effect (meaning this test can be used) for the duration of the COVID-19 declaration under Section 56 4(b)(1) of the Act, 21 U.S.C. section 360bbb-3(b)(1), unless the authorization is terminated or revoked sooner. Performed at [facility], [place] [address], [place], [place] [address]          Radiology Studies: US Renal  Result Date: 04/01/2019 CLINICAL DATA:  Acute kidney  injury EXAM: RENAL / URINARY TRACT ULTRASOUND COMPLETE COMPARISON:  None. FINDINGS: Right Kidney: Renal measurements: 11.1 x 4.3 x 4.5 cm = volume: 110 mL . Echogenicity within normal limits. No mass or hydronephrosis visualized. Left Kidney: Renal measurements: 11.3 x 5.6 x 4.7 cm = volume: 155 mL. Echogenicity within normal limits. There is a thin walled anechoic simple cyst at the mid to inferior pole the left kidney measuring 2.1 x 1.6 x 2.2 cm. No solid mass or hydronephrosis visualized. Bladder: Decompressed with Foley catheter in place. Other: None. IMPRESSION: Negative for obstructive uropathy. Electronically Signed   By: Davina Poke M.D.   On: 04/01/2019 10:11        Scheduled Meds: . aspirin  81 mg Oral Daily  . Chlorhexidine Gluconate Cloth  6 each Topical Daily  . gabapentin  300 mg Oral QID  . heparin  5,000 Units Subcutaneous Q8H  . lamoTRIgine  200 mg Oral BID  . lamoTRIgine  25 mg Oral BID  . mouth rinse  15 mL Mouth Rinse BID  . pantoprazole  40 mg Oral Daily  . potassium chloride  40 mEq Oral Once  . sodium chloride flush  3 mL Intravenous Q12H  . traZODone  50 mg Oral QHS   Continuous Infusions: . sodium chloride Stopped (03/31/19 2313)  . [START ON 04/02/2019] vancomycin       LOS: 0 days    Time spent: 35 min    Nicolette Bang, MD [facility]  If 7PM-7AM, please contact night-coverage  04/01/2019, 10:24 AM

## 2019-04-02 LAB — CREATININE, SERUM
Creatinine, Ser: 1.38 mg/dL — ABNORMAL HIGH (ref 0.61–1.24)
GFR calc Af Amer: 57 mL/min — ABNORMAL LOW (ref >60)
GFR calc non Af Amer: 49 mL/min — ABNORMAL LOW (ref >60)

## 2019-04-02 MED ORDER — MINOCYCLINE HCL 100 MG PO CAPS: 100.0000 mg | ORAL_CAPSULE | Freq: Two times a day (BID) | ORAL | 0 refills | Status: DC

## 2019-04-02 NOTE — Evaluation (Signed)
Physical Therapy Evaluation Patient Details Name: Jeremiah [name]. MRN: [id number] DOB: April 08, 1941 Today's Date: 04/02/2019   History of Present Illness   78 y.o. male with Past medical history of rheumatoid arthritis on immunosuppressive therapy, seizures, HTN, chronic lymphedema.  Went to [facility] last week with cellulitis and got antibiotics, however LE swelling had continued along with increased difficulty with mobility and strength.  Clinical Impression  Pt slated to d/c home today, however he is far from his typical baseline mobility.  While he has no done any walking in the last few months he typically is able to transfer to/from w/c with little or no assist.  Pt showed profound limitations with mobility this date and could not move either LE at all w/o assist and required max assist to get to/from sitting.  He was unable to attain standing even with extremely heavy assist and is not at all close to his baseline or able to safely go home.  He has been to rehab in the past with good results, this is the only safe option for him at this point.      Follow Up Recommendations SNF    Equipment Recommendations  None recommended by PT    Recommendations for Other Services       Precautions / Restrictions Precautions Precautions: Fall Restrictions Weight Bearing Restrictions: No      Mobility  Bed Mobility Overal bed mobility: Needs Assistance Bed Mobility: Supine to Sit;Sit to Supine     Supine to sit: Max assist Sit to supine: Max assist   General bed mobility comments: Pt unable to move either LE even minimally w/o assist, able to help minimally with UEs but needed heave assist to elevate trunk to sitting  Transfers Overall transfer level: Needs assistance Equipment used: Rolling walker (2 wheeled) Transfers: Sit to/from Stand Sit to Stand: Max assist         General transfer comment: Pt was eager to try to get to sitting but even with bed raised significantly, very  heavy assist from PT and excellent effort he could not get LEs fully extended or hips forward and tolerated only ~5 seconds "standing" before needing to flop back on the bed.  Ambulation/Gait                Stairs            Wheelchair Mobility    Modified Rankin (Stroke Patients Only)       Balance Overall balance assessment: Needs assistance Sitting-balance support: No upper extremity supported Sitting balance-Leahy Scale: Good Sitting balance - Comments: Pt with good sitting balance, not reliant on UEs     Standing balance-Leahy Scale: Zero Standing balance comment: unable to attain standing                             Pertinent Vitals/Pain Pain Assessment: (chronic back pain aggravated from being in bed)    Home Living Family/patient expects to be discharged to:: Private residence Living Arrangements: Spouse/significant other;Children Available Help at Discharge: Available 24 hours/day;Family Type of Home: House Home Access: Ramped entrance       Home Equipment: Grab bars - tub/shower;Grab bars - toilet;Wheelchair - [employer] - 2 wheels      Prior Function Level of Independence: Needs assistance   Gait / Transfers Assistance Needed: Pt typically can transfer to/from w/c with little or no assist. Was doing some minimal walking a fwe months ago.  ADL's /  Homemaking Assistance Needed: Pt able to do some bathing and dressing, but always has assist from family        Hand Dominance        Extremity/Trunk Assessment   Upper Extremity Assessment Upper Extremity Assessment: Generalized weakness(limited shoulder elevation b/l, otherwise grossly3/5 t/o)    Lower Extremity Assessment Lower Extremity Assessment: Generalized weakness(minimal ankle AROM, unable to do any against gravity mobilit)       Communication   Communication: No difficulties  Cognition Arousal/Alertness: Awake/alert Behavior During Therapy: WFL for tasks  assessed/performed Overall Cognitive Status: Within Functional Limits for tasks assessed                                        General Comments      Exercises     Assessment/Plan    PT Assessment Patient needs continued PT services  PT Problem List Decreased strength;Decreased range of motion;Decreased activity tolerance;Decreased balance;Decreased mobility;Decreased knowledge of use of DME;Decreased safety awareness;Decreased coordination;Pain       PT Treatment Interventions DME instruction;Gait training;Functional mobility training;Therapeutic activities;Therapeutic exercise;Balance training;Neuromuscular re-education;Cognitive remediation;Patient/family education    PT Goals (Current goals can be found in the Care Plan section)  Acute Rehab PT Goals Patient Stated Goal: get stronger at rehab and go home PT Goal Formulation: With patient Time For Goal Achievement: 04/16/19 Potential to Achieve Goals: Fair    Frequency Min 2X/week   Barriers to discharge        Co-evaluation               AM-PAC PT "6 Clicks" Mobility  Outcome Measure Help needed turning from your back to your side while in a flat bed without using bedrails?: Total Help needed moving from lying on your back to sitting on the side of a flat bed without using bedrails?: Total Help needed moving to and from a bed to a chair (including a wheelchair)?: Total Help needed standing up from a chair using your arms (e.g., wheelchair or bedside chair)?: Total Help needed to walk in hospital room?: Total Help needed climbing 3-5 steps with a railing? : Total 6 Click Score: 6    End of Session Equipment Utilized During Treatment: Gait belt Activity Tolerance: Patient limited by fatigue Patient left: with bed alarm set;with call bell/phone within reach Nurse Communication: Mobility status PT Visit Diagnosis: Muscle weakness (generalized) (M62.81);Unsteadiness on feet (R26.81)    Time:  [id number] PT Time Calculation (min) (ACUTE ONLY): 26 min   Charges:   PT Evaluation $PT Eval Low Complexity: 1 Low          Kreg Shropshire, DPT 04/02/2019, 5:56 PM

## 2019-04-02 NOTE — NC FL2 (Signed)
[place] LEVEL OF CARE SCREENING TOOL     IDENTIFICATION  Patient Name: Jeremiah [name]. Birthdate: May 22, 1940 Sex: male Admission Date (Current Location): 03/31/2019  [place] and [place] Number:  Engineering geologist and Address:  [facility], [address], [place], [place] [address]      Provider Number: [id number]  Attending Physician Name and Address:  Marcell Anger*  Relative Name and Phone Number:       Current Level of Care: Hospital Recommended Level of Care: [place] Prior Approval Number:    Date Approved/Denied:   PASRR Number: [id number]  Discharge Plan: SNF    Current Diagnoses: Patient Active Problem List   Diagnosis Date Noted  . Cellulitis 03/31/2019  . Edema of both legs 08/19/2018  . History of allergy to latex 07/22/2018  . Lymphedema 06/16/2018  . Chronic venous insufficiency 06/16/2018  . Venous ulcer of leg ([place]) 06/16/2018  . PAD (peripheral artery disease) ([place]) 06/16/2018  . Diabetes mellitus type 2, uncomplicated ([place]) 0000000  . 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 Obesity (BMI 30.0-34.9) 02/27/2018  . Primary osteoarthritis of left knee 02/27/2018  . Urinary retention 02/25/2018  . Rheumatoid arthritis involving multiple sites ([place]) 02/25/2018  . Left knee pain 02/20/2018  . Neurogenic pain 10/02/2017  . Spondylosis without myelopathy or radiculopathy, lumbosacral region 07/17/2017  . Trigger point with back pain (recurrent/intermittent) 07/17/2017  . Chronic musculoskeletal pain 02/27/2017  . Myofascial pain syndrome 02/27/2017  . Long term current use of anticoagulant (Plaquenil) 02/27/2017  . Chronic lumbar radiculitis (L5 dermatome) (Right) 02/27/2017  . Chronic lumbar radiculitis (L3 dermatome) (Left) 02/27/2017  . Chronic foot pain (Right) 02/27/2017  . Chronic thoracic back pain (Bilateral) (R>L) 02/27/2017  . Chronic neck pain  (posterior) (Bilateral) (R>L) 02/27/2017  . Chronic shoulder pain (Bilateral) (L>R) 02/27/2017  . 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 DDD (degenerative disc disease), lumbar 02/19/2017  . 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 Chronic knee pain (Bilateral) (R>L) 02/19/2017  . 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 Grade 1 Retrolisthesis of L1 over L2 02/19/2017  . T11 wedge compression fracture ([place]prox. 1993) 02/19/2017  . Lumbar facet arthropathy 02/19/2017  . Lumbar facet syndrome (Bilateral) 02/19/2017  . Lumbar foraminal stenosis (Bilateral: L1-2)  (Right: L3-4, L4-5) 02/19/2017  . 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 Disorder of skeletal system 02/19/2017  . Pharmacologic therapy 02/19/2017  . Problems influencing health status 02/19/2017  . Chronic pain syndrome 01/14/2017  . Gastroesophageal reflux disease 01/14/2017  . Hyperlipidemia 01/14/2017  . Lumbar radiculopathy 01/14/2017  . Postoperative anemia due to acute blood loss 01/14/2017  . Postprocedural hypotension 01/14/2017  . Chronic lower extremity pain (Secondary Area of Pain) (Bilateral) (R>L) 01/14/2017  . Rheumatoid arthritis ([place]) 12/17/2013  . Diabetes ([place]) 12/17/2013  . Benign essential hypertension 11/13/2013  . Chronic low back pain (Primary Area of Pain) (Bilateral) (R>L) 11/13/2013  . Prostatitis 11/13/2013  . Seizure ([place]) 11/13/2013  . Left bundle branch block 05/21/2013  . Dorsalgia 05/21/2013    Orientation RESPIRATION BLADDER Height & Weight     Self, Time, Situation, Place  Normal Continent Weight: 257 lb 15 oz (117 kg) Height:  6' (182.9 cm)  BEHAVIORAL SYMPTOMS/MOOD NEUROLOGICAL BOWEL NUTRITION STATUS  (none) (none) Continent Diet(Heart Healthy)  AMBULATORY STATUS COMMUNICATION OF NEEDS Skin   Extensive Assist Verbally Normal  Personal Care Assistance Level of Assistance  Bathing, Feeding,  Dressing Bathing Assistance: Limited assistance Feeding assistance: Independent Dressing Assistance: Limited assistance     Functional Limitations Info  Sight, Hearing, Speech Sight Info: Adequate Hearing Info: Adequate Speech Info: Adequate    SPECIAL CARE FACTORS FREQUENCY  PT (By licensed PT), OT (By licensed OT)     PT Frequency: 5 OT Frequency: 5            Contractures Contractures Info: Not present    Additional Factors Info  Code Status, Allergies Code Status Info: Full Code Allergies Info: Cefoperazone, Penicillins, Latex           Current Medications (04/02/2019):  This is the current hospital active medication list Current Facility-Administered Medications  Medication Dose Route Frequency Provider Last Rate Last Dose  . 0.9 %  sodium chloride infusion   Intravenous PRN Lavina Hamman, MD   Stopped at 04/02/19 1000  . acetaminophen (TYLENOL) tablet 650 mg  650 mg Oral Q6H PRN Lavina Hamman, MD       Or  . acetaminophen (TYLENOL) suppository 650 mg  650 mg Rectal Q6H PRN Lavina Hamman, MD      . aspirin chewable tablet 81 mg  81 mg Oral Daily Lavina Hamman, MD   81 mg at 04/02/19 1004  . Chlorhexidine Gluconate Cloth 2 % PADS 6 each  6 each Topical Daily Lavina Hamman, MD   6 each at 04/01/19 1030  . gabapentin (NEURONTIN) capsule 300 mg  300 mg Oral TID Marcell Anger, MD   300 mg at 04/02/19 1549  . heparin injection 5,000 Units  5,000 Units Subcutaneous Q8H Lavina Hamman, MD   5,000 Units at 04/02/19 1320  . lamoTRIgine (LAMICTAL) tablet 200 mg  200 mg Oral BID Lavina Hamman, MD   200 mg at 04/02/19 1003  . lamoTRIgine (LAMICTAL) tablet 25 mg  25 mg Oral BID Lavina Hamman, MD   25 mg at 04/02/19 1004  . MEDLINE mouth rinse  15 mL Mouth Rinse BID Lavina Hamman, MD   15 mL at 04/02/19 1005  . ondansetron (ZOFRAN) tablet 4 mg  4 mg Oral Q6H PRN Lavina Hamman, MD       Or  . ondansetron [facility]) injection 4 mg  4 mg Intravenous Q6H  PRN Lavina Hamman, MD      . oxyCODONE (Oxy IR/ROXICODONE) immediate release tablet 5 mg  5 mg Oral Q6H PRN Lavina Hamman, MD   5 mg at 04/02/19 1549  . pantoprazole (PROTONIX) EC tablet 40 mg  40 mg Oral Daily Lavina Hamman, MD   40 mg at 04/02/19 1002  . polyethylene glycol (MIRALAX / GLYCOLAX) packet 17 g  17 g Oral Daily PRN Lavina Hamman, MD      . sodium chloride flush (NS) 0.9 % injection 3 mL  3 mL Intravenous Q12H Lavina Hamman, MD   3 mL at 04/02/19 1005  . traZODone (DESYREL) tablet 50 mg  50 mg Oral QHS Lavina Hamman, MD   50 mg at 04/01/19 2042  . vancomycin (VANCOCIN) 1,250 mg in sodium chloride 0.9 % 250 mL IVPB  1,250 mg Intravenous Q36H Rowland Lathe, RPH   Stopped at 04/02/19 1000     Discharge Medications: Please see discharge summary for a list of discharge medications.  Relevant Imaging Results:  Relevant Lab Results:   Additional Information SSN [id number]  Keona Sheffler  Louretta Shorten, [place]

## 2019-04-02 NOTE — Discharge Summary (Signed)
Physician Discharge Summary  Jeremiah [name]. [id number] DOB: Dec 08, 1940 DOA: 03/31/2019  PCP: Derinda Late, MD  Admit date: 03/31/2019 Discharge date: 04/02/2019  Admitted From: Inpatient Disposition: home  Recommendations for Outpatient Follow-up:  1. Follow up with PCP in 1-2 weeks 2. Please obtain BMP/CBC in one week 3. Please follow up on the following pending results:  Home Health:No Equipment/Devices:no new equipment, pt is wheelcahir bound Discharge Condition:Stable CODE STATUS:Full code Diet recommendation: Cardiac diet  Brief/Interim Summary: Patient is a 78 year old white male with a past no history of rheumatoid arthritis on immunosuppressive therapy, seizures, hypertension, chronic lymphedema who presented with worsening leg infection.  Patient was seen at the ER at [facility] treated with outpatient medications but because of worsening clinical presentation he came to the ER and was admitted for further eval and treatment.  Hospital course: Cellulitis superimposed on venous stasis changes associated lymphedema.  Continued with broad-spectrum antibiotics while in the hospital, patient's white blood cell count normal, blood cultures negative to date..  Patient is remained stable will be discharged home to complete a course of minocycline.  Close follow-up with PCP for resolution.  Acute kidney injury: This is consistent with prerenal in nature, patient received IV albumin, improving, creatinine 1.4 on the day of discharge down from 3.12  Rheumatoid arthritis: Patient can restart his immunosuppressive's outpatient with close follow-up with PCP  Neuropathy: Continue gabapentin  Seizure disorder: Continue Lamictal no evidence of breakthrough seizures  Pressure ulcers present on admission.  These were bilateral buttock as well as mid sacrum: Wound care was consulted with recommendations for barrier cream twice daily and as needed chemotherapy under pads and linens to  wick moisture  Discharge Diagnoses:  Active Problems:   Cellulitis    Discharge Instructions  Discharge Instructions    Call MD for:   Complete by: As directed    Any acute change in medical condition   Call MD for:  difficulty breathing, headache or visual disturbances   Complete by: As directed    Call MD for:  extreme fatigue   Complete by: As directed    Call MD for:  hives   Complete by: As directed    Call MD for:  persistant dizziness or light-headedness   Complete by: As directed    Call MD for:  persistant nausea and vomiting   Complete by: As directed    Call MD for:  severe uncontrolled pain   Complete by: As directed    Call MD for:  temperature >100.4   Complete by: As directed    Diet - low sodium heart healthy   Complete by: As directed    Increase activity slowly   Complete by: As directed      Allergies as of 04/02/2019      Reactions   Cefoperazone Anaphylaxis   Penicillins Anaphylaxis, Other (See Comments)   Has patient had a PCN reaction causing immediate rash, facial/tongue/throat swelling, SOB or lightheadedness with hypotension: Yes Has patient had a PCN reaction causing severe rash involving mucus membranes or skin necrosis: No Has patient had a PCN reaction that required hospitalization No Has patient had a PCN reaction occurring within the last 10 years: No If all of the above answers are "NO", then may proceed with Cephalosporin use.   Latex Rash      Medication List    STOP taking these medications   ciprofloxacin 500 MG tablet Commonly known as: CIPRO   clindamycin 300 MG capsule Commonly known as:  CLEOCIN     TAKE these medications   acetaminophen 325 MG tablet Commonly known as: TYLENOL Take 2 tablets (650 mg total) by mouth every 6 (six) hours as needed for mild pain (or Fever >/= 101).   aspirin 81 MG chewable tablet Chew 81 mg by mouth daily.   Cinnamon 500 MG capsule Take 500 mg by mouth 2 (two) times daily.    Enbrel SureClick 50 MG/ML injection Generic drug: etanercept Inject 50 mg into the skin every Friday.   folic acid 1 MG tablet Commonly known as: FOLVITE Take 1 mg by mouth daily.   gabapentin 300 MG capsule Commonly known as: Neurontin Take 1 capsule (300 mg total) by mouth 4 (four) times daily. QHS   hydroxychloroquine 200 MG tablet Commonly known as: PLAQUENIL Take 400 mg by mouth at bedtime.   lamoTRIgine 200 MG tablet Commonly known as: LAMICTAL Take 200 mg by mouth 2 (two) times daily.   lamoTRIgine 25 MG tablet Commonly known as: LAMICTAL Take 25 mg by mouth 2 (two) times daily.   magnesium oxide 400 MG tablet Commonly known as: MAG-OX Take 400 mg by mouth daily.   methotrexate 2.5 MG tablet Commonly known as: RHEUMATREX TAKE 10 TABS BY MOUTH ONCE A WEEK   minocycline 100 MG capsule Commonly known as: MINOCIN Take 1 capsule (100 mg total) by mouth 2 (two) times daily for 10 days.   nystatin-triamcinolone ointment Commonly known as: MYCOLOG Apply 1 application topically 2 (two) times daily.   omeprazole 40 MG capsule Commonly known as: PRILOSEC Take 40 mg by mouth daily.   polyethylene glycol 17 g packet Commonly known as: MIRALAX / GLYCOLAX Take 17 g by mouth daily.   traZODone 50 MG tablet Commonly known as: DESYREL Take 50 mg by mouth at bedtime.       Allergies  Allergen Reactions  . Cefoperazone Anaphylaxis  . Penicillins Anaphylaxis and Other (See Comments)    Has patient had a PCN reaction causing immediate rash, facial/tongue/throat swelling, SOB or lightheadedness with hypotension: Yes Has patient had a PCN reaction causing severe rash involving mucus membranes or skin necrosis: No Has patient had a PCN reaction that required hospitalization No Has patient had a PCN reaction occurring within the last 10 years: No If all of the above answers are "NO", then may proceed with Cephalosporin use.  . Latex Rash     Consultations:  None   Procedures/Studies: US Renal  Result Date: 04/01/2019 CLINICAL DATA:  Acute kidney injury EXAM: RENAL / URINARY TRACT ULTRASOUND COMPLETE COMPARISON:  None. FINDINGS: Right Kidney: Renal measurements: 11.1 x 4.3 x 4.5 cm = volume: 110 mL . Echogenicity within normal limits. No mass or hydronephrosis visualized. Left Kidney: Renal measurements: 11.3 x 5.6 x 4.7 cm = volume: 155 mL. Echogenicity within normal limits. There is a thin walled anechoic simple cyst at the mid to inferior pole the left kidney measuring 2.1 x 1.6 x 2.2 cm. No solid mass or hydronephrosis visualized. Bladder: Decompressed with Foley catheter in place. Other: None. IMPRESSION: Negative for obstructive uropathy. Electronically Signed   By: Davina Poke M.D.   On: 04/01/2019 10:11       Subjective: Patient reports he feels good at baseline No acute events overnight Remains afebrile with a normal white count  Discharge Exam: Vitals:   04/02/19 0741 04/02/19 1200  BP: 116/60 (!) 93/59  Pulse: 97 93  Resp: 16 16  Temp: 98.5 F (36.9 C) 98.8 F (37.1 C)  SpO2: 97% 97%   Vitals:   04/01/19 2104 04/02/19 0419 04/02/19 0741 04/02/19 1200  BP: (!) 111/52 102/60 116/60 (!) 93/59  Pulse: (!) 107 95 97 93  Resp: 20 20 16 16   Temp: 98.9 F (37.2 C) 99.2 F (37.3 C) 98.5 F (36.9 C) 98.8 F (37.1 C)  TempSrc: Oral Oral Oral Oral  SpO2: 97% 95% 97% 97%  Weight:  117 kg    Height:        General: Pt is alert, awake, not in acute distress Cardiovascular: RRR, S1/S2 +, no rubs, no gallops Respiratory: CTA bilaterally, no wheezing, no rhonchi Abdominal: Soft, NT, ND, bowel sounds + Extremities:  no cyanosis, hemosiderin staining with venous stasis changes.  Chronic lymphedema,    The results of significant diagnostics from this hospitalization (including imaging, microbiology, ancillary and laboratory) are listed below for reference.     Microbiology: Recent Results (from  the past 240 hour(s))  Culture, blood (routine x 2)     Status: None (Preliminary result)   Collection Time: 03/31/19  2:40 PM   Specimen: BLOOD  Result Value Ref Range Status   Specimen Description BLOOD LEFT ANTECUBITAL  Final   Special Requests   Final    BOTTLES DRAWN AEROBIC AND ANAEROBIC Blood Culture adequate volume   Culture   Final    NO GROWTH 2 DAYS Performed at [facility], [address], [place], [place] [address]    Report Status PENDING  Incomplete  Culture, blood (routine x 2)     Status: None (Preliminary result)   Collection Time: 03/31/19  2:40 PM   Specimen: BLOOD  Result Value Ref Range Status   Specimen Description BLOOD BLOOD LEFT HAND  Final   Special Requests   Final    BOTTLES DRAWN AEROBIC AND ANAEROBIC Blood Culture adequate volume   Culture   Final    NO GROWTH 2 DAYS Performed at [facility], [address], [place], [place] [address]    Report Status PENDING  Incomplete  SARS CORONAVIRUS 2 (TAT 6-24 HRS) Nasopharyngeal Nasopharyngeal Swab     Status: None   Collection Time: 03/31/19  2:51 PM   Specimen: Nasopharyngeal Swab  Result Value Ref Range Status   SARS Coronavirus 2 NEGATIVE NEGATIVE Final    Comment: (NOTE) SARS-CoV-2 target nucleic acids are NOT DETECTED. The SARS-CoV-2 RNA is generally detectable in upper and lower respiratory specimens during the acute phase of infection. Negative results do not preclude SARS-CoV-2 infection, do not rule out co-infections with other pathogens, and should not be used as the sole basis for treatment or other patient management decisions. Negative results must be combined with clinical observations, patient history, and epidemiological information. The expected result is Negative. Fact Sheet for Patients: SugarRoll.be Fact Sheet for Healthcare Providers: https://www.woods-mathews.com/ This test is not yet approved or cleared by the [place] FDA and  has been authorized for detection and/or diagnosis of SARS-CoV-2 by FDA under an Emergency Use Authorization (EUA). This EUA will remain  in effect (meaning this test can be used) for the duration of the COVID-19 declaration under Section 56 4(b)(1) of the Act, 21 U.S.C. section 360bbb-3(b)(1), unless the authorization is terminated or revoked sooner. Performed at [facility], [place] [address] [place], [place] [address]      Labs: BNP (last 3 results) No results for input(s): BNP in the last 8760 hours. Basic Metabolic Panel: Recent Labs  Lab 03/31/19 1440 04/01/19 0536 04/02/19 0428  NA 139 140  --   K 3.3* 3.2*  --   CL 99 104  --   CO2 28 28  --   GLUCOSE 103* 94  --   BUN 46* 39*  --   CREATININE 3.12* 2.12* 1.38*  CALCIUM 8.9 8.4*  --    Liver Function Tests: Recent Labs  Lab 04/01/19 0536  AST 37  ALT 29  ALKPHOS 66  BILITOT 0.7  PROT 5.4*  ALBUMIN 2.8*   No results for input(s): LIPASE, AMYLASE in the last 168 hours. No results for input(s): AMMONIA in the last 168 hours. CBC: Recent Labs  Lab 03/31/19 1440 04/01/19 0536  WBC 3.0* 2.9*  NEUTROABS 1.6*  --   HGB 10.0* 8.5*  HCT 30.5* 26.0*  MCV 86.4 85.2  PLT 206 165   Cardiac Enzymes: No results for input(s): CKTOTAL, CKMB, CKMBINDEX, TROPONINI in the last 168 hours. BNP: Invalid input(s): POCBNP CBG: No results for input(s): GLUCAP in the last 168 hours. D-Dimer No results for input(s): DDIMER in the last 72 hours. Hgb A1c No results for input(s): HGBA1C in the last 72 hours. Lipid Profile No results for input(s): CHOL, HDL, LDLCALC, TRIG, CHOLHDL, LDLDIRECT in the last 72 hours. Thyroid function studies No results for input(s): TSH, T4TOTAL, T3FREE, THYROIDAB in the last 72 hours.  Invalid input(s): FREET3 Anemia work up No results for input(s): VITAMINB12, FOLATE, FERRITIN, TIBC, IRON, RETICCTPCT in the last 72 hours. Urinalysis    Component Value Date/Time    COLORURINE AMBER (A) 02/20/2018 1317   APPEARANCEUR Clear 02/19/2019 1438   LABSPEC 1.026 02/20/2018 1317   PHURINE 6.0 02/20/2018 1317   GLUCOSEU Negative 02/19/2019 1438   HGBUR NEGATIVE 02/20/2018 1317   BILIRUBINUR Negative 02/19/2019 1438   KETONESUR NEGATIVE 02/20/2018 1317   PROTEINUR Negative 02/19/2019 1438   PROTEINUR NEGATIVE 02/20/2018 1317   NITRITE Negative 02/19/2019 1438   NITRITE NEGATIVE 02/20/2018 1317   LEUKOCYTESUR Trace (A) 02/19/2019 1438   Sepsis Labs Invalid input(s): PROCALCITONIN,  WBC,  LACTICIDVEN Microbiology Recent Results (from the past 240 hour(s))  Culture, blood (routine x 2)     Status: None (Preliminary result)   Collection Time: 03/31/19  2:40 PM   Specimen: BLOOD  Result Value Ref Range Status   Specimen Description BLOOD LEFT ANTECUBITAL  Final   Special Requests   Final    BOTTLES DRAWN AEROBIC AND ANAEROBIC Blood Culture adequate volume   Culture   Final    NO GROWTH 2 DAYS Performed at [facility], [address], [place], [place] [address]    Report Status PENDING  Incomplete  Culture, blood (routine x 2)     Status: None (Preliminary result)   Collection Time: 03/31/19  2:40 PM   Specimen: BLOOD  Result Value Ref Range Status   Specimen Description BLOOD BLOOD LEFT HAND  Final   Special Requests   Final    BOTTLES DRAWN AEROBIC AND ANAEROBIC Blood Culture adequate volume   Culture   Final    NO GROWTH 2 DAYS Performed at [facility], [address], [place], [place] [address]    Report Status PENDING  Incomplete  SARS CORONAVIRUS 2 (TAT 6-24 HRS) Nasopharyngeal Nasopharyngeal Swab     Status: None   Collection Time: 03/31/19  2:51 PM   Specimen: Nasopharyngeal Swab  Result Value Ref Range Status   SARS Coronavirus 2 NEGATIVE NEGATIVE Final    Comment: (NOTE) SARS-CoV-2 target nucleic acids are NOT DETECTED. The  SARS-CoV-2 RNA is generally detectable in upper and lower respiratory specimens during the  acute phase of infection. Negative results do not preclude SARS-CoV-2 infection, do not rule out co-infections with other pathogens, and should not be used as the sole basis for treatment or other patient management decisions. Negative results must be combined with clinical observations, patient history, and epidemiological information. The expected result is Negative. Fact Sheet for Patients: SugarRoll.be Fact Sheet for Healthcare Providers: https://www.woods-mathews.com/ This test is not yet approved or cleared by the [place] FDA and  has been authorized for detection and/or diagnosis of SARS-CoV-2 by FDA under an Emergency Use Authorization (EUA). This EUA will remain  in effect (meaning this test can be used) for the duration of the COVID-19 declaration under Section 56 4(b)(1) of the Act, 21 U.S.C. section 360bbb-3(b)(1), unless the authorization is terminated or revoked sooner. Performed at [facility], [place] [address] [place], [place] [address]      Time coordinating discharge: Over 30 minutes  SIGNED:   Nicolette Bang, MD  [facility] 04/02/2019, 12:54 PM Pager   If 7PM-7AM, please contact night-coverage [email or web address] Password [email or web address]

## 2019-04-02 NOTE — TOC Initial Note (Signed)
Transition of Care (TOC) - Initial/Assessment Note    Patient Details  Name: Jeremiah [name]. MRN: [id number] Date of Birth: 1940/08/14  Transition of Care [facility]) CM/SW Contact:    Beverly Sessions, RN Phone Number: 04/02/2019, 5:20 PM  Clinical Narrative:                  Patient admitted from home with cellulitis Patient lives at home with wife  PCP [facility] denies issues with obtaining medications  Patient states at home he has WC, compression hose, and BSC  PT has assessed patient and recommends SNF.  Patient states that he would like to purse, and would prefer [facility] if that is an option  Immunologist sent for signature Bed Search initiated  RNCM reached out to [place] at [facility] and request to review   Expected Discharge Plan: [place]     Patient Goals and CMS Choice        Expected Discharge Plan and Services Expected Discharge Plan: [place]   Discharge Planning Services: CM Consult   Living arrangements for the past 2 months: Single Family Home Expected Discharge Date: 04/02/19                                    Prior Living Arrangements/Services Living arrangements for the past 2 months: Single Family Home Lives with:: Spouse Patient language and need for interpreter reviewed:: Yes Do you feel safe going back to the place where you live?: Yes      Need for Family Participation in Patient Care: Yes (Comment) Care giver support system in place?: Yes (comment) Current home services: DME Criminal Activity/Legal Involvement Pertinent to Current Situation/Hospitalization: No - Comment as needed  Activities of Daily Living Home Assistive Devices/Equipment: Wheelchair ADL Screening (condition at time of admission) Patient's cognitive ability adequate to safely complete daily activities?: Yes Is the patient deaf or have difficulty hearing?: No Does the patient have  difficulty seeing, even when wearing glasses/contacts?: No Does the patient have difficulty concentrating, remembering, or making decisions?: No Patient able to express need for assistance with ADLs?: Yes Does the patient have difficulty dressing or bathing?: Yes Independently performs ADLs?: No Communication: Independent Dressing (OT): Needs assistance Is this a change from baseline?: Pre-admission baseline Grooming: Needs assistance Is this a change from baseline?: Pre-admission baseline Feeding: Independent Bathing: Needs assistance Is this a change from baseline?: Pre-admission baseline Toileting: Needs assistance Is this a change from baseline?: Pre-admission baseline In/Out Bed: Needs assistance Is this a change from baseline?: Pre-admission baseline Walks in Home: Needs assistance Is this a change from baseline?: Pre-admission baseline Does the patient have difficulty walking or climbing stairs?: Yes Weakness of Legs: Both Weakness of Arms/Hands: Both  Permission Sought/Granted                  Emotional Assessment Appearance:: Appears stated age Attitude/Demeanor/Rapport: Gracious Affect (typically observed): Accepting Orientation: : Oriented to Self, Oriented to Place, Oriented to  Time, Oriented to Situation   Psych Involvement: No (comment)  Admission diagnosis:  Lymphedema [I89.0] Cellulitis of right lower extremity [L03.115] AKI (acute kidney injury) ([place]) [N17.9] Patient Active Problem List   Diagnosis Date Noted  . Cellulitis 03/31/2019  . Edema of both legs 08/19/2018  . History of allergy to latex 07/22/2018  . Lymphedema 06/16/2018  . Chronic venous insufficiency 06/16/2018  .  Venous ulcer of leg ([place]) 06/16/2018  . PAD (peripheral artery disease) ([place]) 06/16/2018  . Diabetes mellitus type 2, uncomplicated ([place]) 0000000  . 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 Obesity (BMI 30.0-34.9) 02/27/2018  . Primary osteoarthritis of  left knee 02/27/2018  . Urinary retention 02/25/2018  . Rheumatoid arthritis involving multiple sites ([place]) 02/25/2018  . Left knee pain 02/20/2018  . Neurogenic pain 10/02/2017  . Spondylosis without myelopathy or radiculopathy, lumbosacral region 07/17/2017  . Trigger point with back pain (recurrent/intermittent) 07/17/2017  . Chronic musculoskeletal pain 02/27/2017  . Myofascial pain syndrome 02/27/2017  . Long term current use of anticoagulant (Plaquenil) 02/27/2017  . Chronic lumbar radiculitis (L5 dermatome) (Right) 02/27/2017  . Chronic lumbar radiculitis (L3 dermatome) (Left) 02/27/2017  . Chronic foot pain (Right) 02/27/2017  . Chronic thoracic back pain (Bilateral) (R>L) 02/27/2017  . Chronic neck pain (posterior) (Bilateral) (R>L) 02/27/2017  . Chronic shoulder pain (Bilateral) (L>R) 02/27/2017  . 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 DDD (degenerative disc disease), lumbar 02/19/2017  . 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 Chronic knee pain (Bilateral) (R>L) 02/19/2017  . 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 Grade 1 Retrolisthesis of L1 over L2 02/19/2017  . T11 wedge compression fracture ([place]x. 1993) 02/19/2017  . Lumbar facet arthropathy 02/19/2017  . Lumbar facet syndrome (Bilateral) 02/19/2017  . Lumbar foraminal stenosis (Bilateral: L1-2)  (Right: L3-4, L4-5) 02/19/2017  . 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 Disorder of skeletal system 02/19/2017  . Pharmacologic therapy 02/19/2017  . Problems influencing health status 02/19/2017  . Chronic pain syndrome 01/14/2017  . Gastroesophageal reflux disease 01/14/2017  . Hyperlipidemia 01/14/2017  . Lumbar radiculopathy 01/14/2017  . Postoperative anemia due to acute blood loss 01/14/2017  . Postprocedural hypotension 01/14/2017  . Chronic lower extremity pain (Secondary Area of Pain) (Bilateral) (R>L) 01/14/2017  . Rheumatoid arthritis ([place])  12/17/2013  . Diabetes ([place]) 12/17/2013  . Benign essential hypertension 11/13/2013  . Chronic low back pain (Primary Area of Pain) (Bilateral) (R>L) 11/13/2013  . Prostatitis 11/13/2013  . Seizure ([place]) 11/13/2013  . Left bundle branch block 05/21/2013  . Dorsalgia 05/21/2013   PCP:  Derinda Late, MD Pharmacy:   [id number] [place], [place] [place] [address] Phone: [phone] Fax: [phone]     Social Determinants of Health (SDOH) Interventions    Readmission Risk Interventions No flowsheet data found.

## 2019-04-03 MED ORDER — BLISTEX MEDICATED EX OINT
TOPICAL_OINTMENT | CUTANEOUS | Status: DC | PRN
  Administered 2019-04-11 – 2019-04-25 (×3): via TOPICAL
  Filled 2019-04-03 (×2): qty 6.3

## 2019-04-03 MED ORDER — GABAPENTIN 300 MG PO CAPS
300.0000 mg | ORAL_CAPSULE | Freq: Four times a day (QID) | ORAL | Status: DC
  Administered 2019-04-03 – 2019-04-12 (×33): 300 mg via ORAL
  Filled 2019-04-03 (×33): qty 1

## 2019-04-03 MED ORDER — ENOXAPARIN SODIUM 40 MG/0.4ML ~~LOC~~ SOLN
40.0000 mg | SUBCUTANEOUS | Status: DC
  Administered 2019-04-03 – 2019-04-06 (×4): 40 mg via SUBCUTANEOUS
  Filled 2019-04-03 (×4): qty 0.4

## 2019-04-03 MED ORDER — DOXYCYCLINE HYCLATE 100 MG PO TABS
100.0000 mg | ORAL_TABLET | Freq: Two times a day (BID) | ORAL | Status: DC
  Administered 2019-04-03 – 2019-04-09 (×13): 100 mg via ORAL
  Filled 2019-04-03 (×13): qty 1

## 2019-04-03 NOTE — Care Management Important Message (Signed)
Important Message  Patient Details  Name: Jeremiah [name]. MRN: [id number] Date of Birth: [birth year]   Medicare Important Message Given:  Yes  Initial Medicare IM given by Patient Access Associate on 04/02/2019 at 9:48am.     [name] 04/03/2019, 8:46 AM

## 2019-04-03 NOTE — Progress Notes (Signed)
PROGRESS NOTE    Jeremiah [name].  KF:[id number] DOB: March 09, 1941 DOA: 03/31/2019 PCP: Derinda Late, MD   Brief Narrative:  Patient is a 78 year old white male with a past no history of rheumatoid arthritis on immunosuppressive therapy, seizures, hypertension, chronic lymphedema who presented with worsening leg infection.  Patient was seen at the ER at [facility] treated with outpatient medications but because of worsening clinical presentation he came to the ER and was admitted for further eval and treatment   Assessment & Plan:   Active Problems:   Cellulitis   Cellulitis superimposed on venous stasis changes associated lymphedema. Narrow antibiotics.  Follow-up cultures neg, CRP elevated as expected, monitor renal function closely which has improved from admission creatinine 3.12  to 1.4  Acute kidney injury: Suspect was likely prerenal in nature,patient received IV albumin, improving, continue to monitor closely has improved  Rheumatoid arthritis: Agree with holding immunosuppressants currently  Neuropathy: Continue gabapentin we will increase back to renally dose adjusted  Seizure disorder: Continue Lamictal no evidence of breakthrough seizures  Pressure ulcers present on admission.  These were bilateral buttock as well as mid sacrum: Wound care was consulted appreciate their recommendations  DVT prophylaxis: Heparin SQ  Code Status: Full code    Code Status Orders  (From admission, onward)         Start     Ordered   03/31/19 1819  Full code  Continuous     03/31/19 1819        Code Status History    Date Active Date Inactive Code Status Order ID Comments User Context   02/20/2018 2220 02/24/2018 2019 Full Code [id number]  Lance Coon, MD ED   05/23/2015 2221 05/26/2015 1741 Full Code [id number]  Hower, Aaron Mose, MD ED   Advance Care Planning Activity     Family Communication: SPOKE WITH WIFE Disposition Plan:   Patient will remain inpatient, requires more  physical therapy for mobility balance transfers and strength.  Patient is unsafe for discharge with high risk of falls and worsening clinical status Consults called: None Admission status: Inpatient   Consultants:   None  Procedures:  US Renal  Result Date: 04/01/2019 CLINICAL DATA:  Acute kidney injury EXAM: RENAL / URINARY TRACT ULTRASOUND COMPLETE COMPARISON:  None. FINDINGS: Right Kidney: Renal measurements: 11.1 x 4.3 x 4.5 cm = volume: 110 mL . Echogenicity within normal limits. No mass or hydronephrosis visualized. Left Kidney: Renal measurements: 11.3 x 5.6 x 4.7 cm = volume: 155 mL. Echogenicity within normal limits. There is a thin walled anechoic simple cyst at the mid to inferior pole the left kidney measuring 2.1 x 1.6 x 2.2 cm. No solid mass or hydronephrosis visualized. Bladder: Decompressed with Foley catheter in place. Other: None. IMPRESSION: Negative for obstructive uropathy. Electronically Signed   By: Davina Poke M.D.   On: 04/01/2019 10:11     Antimicrobials:   Vancomycin discontinued on day 3  Started doxycycline 100 mg p.o. twice daily day 1, November 13   Subjective: No acute events overnight Discharge canceled secondary patient's inability TO PIVOT which he needs to be able to do at home  Objective: Vitals:   04/02/19 1200 04/02/19 2008 04/03/19 0434 04/03/19 0528  BP: (!) 93/59 (!) 141/86 (!) 149/86   Pulse: 93 (!) 110 (!) 109   Resp: 16 20 20    Temp: 98.8 F (37.1 C) 98.3 F (36.8 C) 98.4 F (36.9 C)   TempSrc: Oral Oral Oral   SpO2: 97% 100%  100%   Weight:    113 kg  Height:        Intake/Output Summary (Last 24 hours) at 04/03/2019 1227 Last data filed at 04/02/2019 2148 Gross per 24 hour  Intake 739.71 ml  Output 200 ml  Net 539.71 ml   Filed Weights   04/01/19 0500 04/02/19 0419 04/03/19 0528  Weight: 117.9 kg 117 kg 113 kg    Examination:  General exam: Appears calm and comfortable  Respiratory system: Clear to  auscultation. Respiratory effort normal. Cardiovascular system: S1 & S2 heard, RRR. No JVD, murmurs, rubs, gallops or clicks. No pedal edema. Gastrointestinal system: Abdomen is nondistended, soft and nontender. No organomegaly or masses felt. Normal bowel sounds heard. Central nervous system: Alert and oriented. No focal neurological deficits. Extremities: Chronic lymphedema 1-2+ pitting edema, neurovascularly intact. Skin: Hemosiderin staining, with venous stasis changes, chronic lymphedema unchanged mild warmth consistent with mild cellulitis Psychiatry: Judgement and insight appear normal. Mood & affect appropriate.     Data Reviewed: I have personally reviewed following labs and imaging studies  CBC: Recent Labs  Lab 03/31/19 1440 04/01/19 0536  WBC 3.0* 2.9*  NEUTROABS 1.6*  --   HGB 10.0* 8.5*  HCT 30.5* 26.0*  MCV 86.4 85.2  PLT 206 [id number]   Basic Metabolic Panel: Recent Labs  Lab 03/31/19 1440 04/01/19 0536 04/02/19 0428  NA 139 140  --   K 3.3* 3.2*  --   CL 99 104  --   CO2 28 28  --   GLUCOSE 103* 94  --   BUN 46* 39*  --   CREATININE 3.12* 2.12* 1.38*  CALCIUM 8.9 8.4*  --    GFR: Estimated Creatinine Clearance: 58.2 mL/min (A) (by C-G formula based on SCr of 1.38 mg/dL (H)). Liver Function Tests: Recent Labs  Lab 04/01/19 0536  AST 37  ALT 29  ALKPHOS 66  BILITOT 0.7  PROT 5.4*  ALBUMIN 2.8*   No results for input(s): LIPASE, AMYLASE in the last 168 hours. No results for input(s): AMMONIA in the last 168 hours. Coagulation Profile: No results for input(s): INR, PROTIME in the last 168 hours. Cardiac Enzymes: No results for input(s): CKTOTAL, CKMB, CKMBINDEX, TROPONINI in the last 168 hours. BNP (last 3 results) No results for input(s): PROBNP in the last 8760 hours. HbA1C: No results for input(s): HGBA1C in the last 72 hours. CBG: No results for input(s): GLUCAP in the last 168 hours. Lipid Profile: No results for input(s): CHOL, HDL,  LDLCALC, TRIG, CHOLHDL, LDLDIRECT in the last 72 hours. Thyroid Function Tests: No results for input(s): TSH, T4TOTAL, FREET4, T3FREE, THYROIDAB in the last 72 hours. Anemia Panel: No results for input(s): VITAMINB12, FOLATE, FERRITIN, TIBC, IRON, RETICCTPCT in the last 72 hours. Sepsis Labs: Recent Labs  Lab 03/31/19 1440  LATICACIDVEN 0.9    Recent Results (from the past 240 hour(s))  Culture, blood (routine x 2)     Status: None (Preliminary result)   Collection Time: 03/31/19  2:40 PM   Specimen: BLOOD  Result Value Ref Range Status   Specimen Description BLOOD LEFT ANTECUBITAL  Final   Special Requests   Final    BOTTLES DRAWN AEROBIC AND ANAEROBIC Blood Culture adequate volume   Culture   Final    NO GROWTH 3 DAYS Performed at [facility], [address] [place], [place] [address]    Report Status PENDING  Incomplete  Culture, blood (routine x 2)     Status: None (Preliminary result)  Collection Time: 03/31/19  2:40 PM   Specimen: BLOOD  Result Value Ref Range Status   Specimen Description BLOOD BLOOD LEFT HAND  Final   Special Requests   Final    BOTTLES DRAWN AEROBIC AND ANAEROBIC Blood Culture adequate volume   Culture   Final    NO GROWTH 3 DAYS Performed at [facility], [address], [place], [place] [address]    Report Status PENDING  Incomplete  SARS CORONAVIRUS 2 (TAT 6-24 HRS) Nasopharyngeal Nasopharyngeal Swab     Status: None   Collection Time: 03/31/19  2:51 PM   Specimen: Nasopharyngeal Swab  Result Value Ref Range Status   SARS Coronavirus 2 NEGATIVE NEGATIVE Final    Comment: (NOTE) SARS-CoV-2 target nucleic acids are NOT DETECTED. The SARS-CoV-2 RNA is generally detectable in upper and lower respiratory specimens during the acute phase of infection. Negative results do not preclude SARS-CoV-2 infection, do not rule out co-infections with other pathogens, and should not be used as the sole basis for treatment or other  patient management decisions. Negative results must be combined with clinical observations, patient history, and epidemiological information. The expected result is Negative. Fact Sheet for Patients: SugarRoll.be Fact Sheet for Healthcare Providers: https://www.woods-mathews.com/ This test is not yet approved or cleared by the [place] FDA and  has been authorized for detection and/or diagnosis of SARS-CoV-2 by FDA under an Emergency Use Authorization (EUA). This EUA will remain  in effect (meaning this test can be used) for the duration of the COVID-19 declaration under Section 56 4(b)(1) of the Act, 21 U.S.C. section 360bbb-3(b)(1), unless the authorization is terminated or revoked sooner. Performed at [facility], [place] [address] [place], [place] [address]          Radiology Studies: No results found.      Scheduled Meds: . aspirin  81 mg Oral Daily  . Chlorhexidine Gluconate Cloth  6 each Topical Daily  . doxycycline  100 mg Oral Q12H  . enoxaparin (LOVENOX) injection  40 mg Subcutaneous Q24H  . gabapentin  300 mg Oral QID  . lamoTRIgine  200 mg Oral BID  . lamoTRIgine  25 mg Oral BID  . mouth rinse  15 mL Mouth Rinse BID  . pantoprazole  40 mg Oral Daily  . sodium chloride flush  3 mL Intravenous Q12H  . traZODone  50 mg Oral QHS   Continuous Infusions: . sodium chloride Stopped (04/02/19 1102)     LOS: 2 days    Time spent: [place], MD [facility]  If 7PM-7AM, please contact night-coverage  04/03/2019, 12:27 PM

## 2019-04-03 NOTE — Progress Notes (Signed)
Patient remained in hospital additional day secondary to inability to stand and pivot to wheelchair which was a minimal requirement for patient to go home Case was discussed with wife again and they now agreed to skilled nursing facility. For full details please see discharge summary note of November 12

## 2019-04-03 NOTE — Consult Note (Signed)
Pharmacy Antibiotic Note  Jeremiah [name]. is a 78 y.o. male admitted on 03/31/2019 with cellulitis. Past medical history significant for lymphedema, hypertension, diabetes, peripheral artery disease. Patient with leg pain and swelling in lower extremities over last two weeks with worsening erythema. He was recently evaluated at [facility] ED approximately one week ago and discharged on ciprofloxacin and clindamycin but symptoms have progressed despite antibiotics. Pharmacy has been consulted for vancomycin dosing.   Patient has been ordered vancomycin 1 g + 1 g for a total loading dose of 2 g. Scr on admission is elevated to 3.12 and per [facility] records it was 1.4 on 11/5. Other labs significant for leukopenia with WBC of 2.9 and a mild neutropenia on differential. Patient remains afebrile.   Allergies: PCN, Cefoperazone   - noted to be severe allergies; per chart review patient has not had any cephalosporins or PCNs   Patient was supposed to be discharged home yesterday on oral antibiotics. D/w attending this morning about switching to oral antibiotic.   Plan: Vancomycin 2000 mg IV q36h. Will follow up on MD's decision about antibiotic.  Goal AUC 400-550. Expected AUC: 521.7, trough 9.5 SCr used: 1.38  Continue to monitor daily Scr per protocol and adjust antibiotic dosing as indicated.  Height: 6' (182.9 cm) Weight: 249 lb 1.9 oz (113 kg) IBW/kg (Calculated) : 77.6  Temp (24hrs), Avg:98.5 F (36.9 C), Min:98.3 F (36.8 C), Max:98.8 F (37.1 C)  Recent Labs  Lab 03/31/19 1440 04/01/19 0536 04/02/19 0428  WBC 3.0* 2.9*  --   CREATININE 3.12* 2.12* 1.38*  LATICACIDVEN 0.9  --   --     Estimated Creatinine Clearance: 58.2 mL/min (A) (by C-G formula based on SCr of 1.38 mg/dL (H)).    Allergies  Allergen Reactions  . Cefoperazone Anaphylaxis  . Penicillins Anaphylaxis and Other (See Comments)    Has patient had a PCN reaction causing immediate rash, facial/tongue/throat swelling,  SOB or lightheadedness with hypotension: Yes Has patient had a PCN reaction causing severe rash involving mucus membranes or skin necrosis: No Has patient had a PCN reaction that required hospitalization No Has patient had a PCN reaction occurring within the last 10 years: No If all of the above answers are "NO", then may proceed with Cephalosporin use.  . Latex Rash    Antimicrobials this admission: Vancomycin 11/10 >>   Dose adjustments this admission: n/a  Microbiology results: 11/10 BCx: NGTD 11/10 COVID-19: negative   Thank you for allowing pharmacy to be a part of this patient's care.  Rowland Lathe  04/03/2019 11:53 AM

## 2019-04-04 LAB — CBC WITH DIFFERENTIAL/PLATELET
Abs Immature Granulocytes: 0.01 10*3/uL (ref 0.00–0.07)
Basophils Absolute: 0 10*3/uL (ref 0.0–0.1)
Basophils Relative: 1 %
Eosinophils Absolute: 0.1 10*3/uL (ref 0.0–0.5)
Eosinophils Relative: 6 %
HCT: 27.8 % — ABNORMAL LOW (ref 39.0–52.0)
Hemoglobin: 9.2 g/dL — ABNORMAL LOW (ref 13.0–17.0)
Immature Granulocytes: 1 %
Lymphocytes Relative: 21 %
Lymphs Abs: 0.5 10*3/uL — ABNORMAL LOW (ref 0.7–4.0)
MCH: 28.2 pg (ref 26.0–34.0)
MCHC: 33.1 g/dL (ref 30.0–36.0)
MCV: 85.3 fL (ref 80.0–100.0)
Monocytes Absolute: 0 10*3/uL — ABNORMAL LOW (ref 0.1–1.0)
Monocytes Relative: 2 %
Neutro Abs: 1.5 10*3/uL — ABNORMAL LOW (ref 1.7–7.7)
Neutrophils Relative %: 69 %
Platelets: 107 10*3/uL — ABNORMAL LOW (ref 150–400)
RBC: 3.26 MIL/uL — ABNORMAL LOW (ref 4.22–5.81)
RDW: 17.2 % — ABNORMAL HIGH (ref 11.5–15.5)
WBC: 2.2 10*3/uL — ABNORMAL LOW (ref 4.0–10.5)
nRBC: 0 % (ref 0.0–0.2)

## 2019-04-04 NOTE — Progress Notes (Addendum)
PROGRESS NOTE    Jeremiah [name].  KF:[id number] DOB: 1940-12-16 DOA: 03/31/2019 PCP: Derinda Late, MD   Brief Narrative:  Patient is a 78 year old white male with a past no history of rheumatoid arthritis on immunosuppressive therapy, seizures, hypertension, chronic lymphedema who presented with worsening leg infection. Patient was seen at the ER at [facility] treated with outpatient medications but because of worsening clinical presentation he came to the ER and was admitted for further eval and treatment   Assessment & Plan:   Principal Problem:   Cellulitis Active Problems:   Rheumatoid arthritis ([place])   Lymphedema   Cellulitis superimposed on venous stasis changes associated lymphedema. No change,  Narrowed antibiotics. Follow-up cultures neg, CRP elevated as expected, monitor renal function closely which has improved from admission creatinine 3.12  to 1.4  Acute kidney injury: Suspect was likely prerenal in nature,patient received IV albumin, improving,  improved  Rheumatoid arthritis: continue holding immunosuppressants currently  Neuropathy: Continue gabapentin   Seizure disorder: Continue Lamictal no evidence of breakthrough seizures  Pancytopenia: Monitor closely, reviewed med list for offending medications, white count 2.2, holding immunosuppressive's  Pressure ulcers present on admission. These were bilateral buttock as well as mid sacrum: Wound care was consulted appreciate their recommendations  DVT prophylaxis: Heparin SQ  Code Status: full    Code Status Orders  (From admission, onward)         Start     Ordered   03/31/19 1819  Full code  Continuous     03/31/19 1819        Code Status History    Date Active Date Inactive Code Status Order ID Comments User Context   02/20/2018 2220 02/24/2018 2019 Full Code [id number]  Lance Coon, MD ED   05/23/2015 2221 05/26/2015 1741 Full Code [id number]  Hower, Aaron Mose, MD ED   Advance Care Planning  Activity     Family Communication: none today Disposition Plan:    Patient will remain inpatient, requires more physical therapy for mobility balance transfers and strength.  Patient is unsafe for discharge with high risk of falls and worsening clinical status Consults called: None Admission status: Inpatient   Consultants:   None  Procedures:  US Renal  Result Date: 04/01/2019 CLINICAL DATA:  Acute kidney injury EXAM: RENAL / URINARY TRACT ULTRASOUND COMPLETE COMPARISON:  None. FINDINGS: Right Kidney: Renal measurements: 11.1 x 4.3 x 4.5 cm = volume: 110 mL . Echogenicity within normal limits. No mass or hydronephrosis visualized. Left Kidney: Renal measurements: 11.3 x 5.6 x 4.7 cm = volume: 155 mL. Echogenicity within normal limits. There is a thin walled anechoic simple cyst at the mid to inferior pole the left kidney measuring 2.1 x 1.6 x 2.2 cm. No solid mass or hydronephrosis visualized. Bladder: Decompressed with Foley catheter in place. Other: None. IMPRESSION: Negative for obstructive uropathy. Electronically Signed   By: Davina Poke M.D.   On: 04/01/2019 10:11     Antimicrobials:   Vancomycin discontinued on day 3  Started doxycycline 100 mg p.o. twice daily >11/13 day 2    Subjective: No acute events overnight Continues to work with physical therapy still with significant weakness  Objective: Vitals:   04/04/19 0329 04/04/19 0330 04/04/19 0330 04/04/19 1200  BP:  (!) 141/81 (!) 141/81 140/85  Pulse:  (!) 110 (!) 108 (!) 105  Resp: 20 20 20 20   Temp:  98.6 F (37 C) 98.6 F (37 C) 98.6 F (37 C)  TempSrc:  Oral Oral  Oral  SpO2:  99% 100% 100%  Weight:      Height:        Intake/Output Summary (Last 24 hours) at 04/04/2019 1352 Last data filed at 04/04/2019 0635 Gross per 24 hour  Intake -  Output 1 ml  Net -1 ml   Filed Weights   04/01/19 0500 04/02/19 0419 04/03/19 0528  Weight: 117.9 kg 117 kg 113 kg    Examination:  General exam:  Appears calm and comfortable  Respiratory system: Clear to auscultation. Respiratory effort normal. Cardiovascular system: S1 & S2 heard, RRR. No JVD, murmurs, rubs, gallops or clicks. No pedal edema. Gastrointestinal system: Abdomen is nondistended, soft and nontender. No organomegaly or masses felt. Normal bowel sounds heard. Central nervous system: Alert and oriented. No focal neurological deficits. Extremities: Chronic lymphedema 1-2+ pitting edema, neurovascularly intact. Skin: Hemosiderin staining, with venous stasis changes, chronic lymphedema unchanged mild warmth consistent with mild cellulitis Psychiatry: Judgement and insight appear normal. Mood & affect appropriate.     Data Reviewed: I have personally reviewed following labs and imaging studies  CBC: Recent Labs  Lab 03/31/19 1440 04/01/19 0536 04/04/19 0502  WBC 3.0* 2.9* 2.2*  NEUTROABS 1.6*  --  1.5*  HGB 10.0* 8.5* 9.2*  HCT 30.5* 26.0* 27.8*  MCV 86.4 85.2 85.3  PLT 206 165 [id number]*   Basic Metabolic Panel: Recent Labs  Lab 03/31/19 1440 04/01/19 0536 04/02/19 0428  NA 139 140  --   K 3.3* 3.2*  --   CL 99 104  --   CO2 28 28  --   GLUCOSE 103* 94  --   BUN 46* 39*  --   CREATININE 3.12* 2.12* 1.38*  CALCIUM 8.9 8.4*  --    GFR: Estimated Creatinine Clearance: 58.2 mL/min (A) (by C-G formula based on SCr of 1.38 mg/dL (H)). Liver Function Tests: Recent Labs  Lab 04/01/19 0536  AST 37  ALT 29  ALKPHOS 66  BILITOT 0.7  PROT 5.4*  ALBUMIN 2.8*   No results for input(s): LIPASE, AMYLASE in the last 168 hours. No results for input(s): AMMONIA in the last 168 hours. Coagulation Profile: No results for input(s): INR, PROTIME in the last 168 hours. Cardiac Enzymes: No results for input(s): CKTOTAL, CKMB, CKMBINDEX, TROPONINI in the last 168 hours. BNP (last 3 results) No results for input(s): PROBNP in the last 8760 hours. HbA1C: No results for input(s): HGBA1C in the last 72 hours. CBG: No  results for input(s): GLUCAP in the last 168 hours. Lipid Profile: No results for input(s): CHOL, HDL, LDLCALC, TRIG, CHOLHDL, LDLDIRECT in the last 72 hours. Thyroid Function Tests: No results for input(s): TSH, T4TOTAL, FREET4, T3FREE, THYROIDAB in the last 72 hours. Anemia Panel: No results for input(s): VITAMINB12, FOLATE, FERRITIN, TIBC, IRON, RETICCTPCT in the last 72 hours. Sepsis Labs: Recent Labs  Lab 03/31/19 1440  LATICACIDVEN 0.9    Recent Results (from the past 240 hour(s))  Culture, blood (routine x 2)     Status: None (Preliminary result)   Collection Time: 03/31/19  2:40 PM   Specimen: BLOOD  Result Value Ref Range Status   Specimen Description BLOOD LEFT ANTECUBITAL  Final   Special Requests   Final    BOTTLES DRAWN AEROBIC AND ANAEROBIC Blood Culture adequate volume   Culture   Final    NO GROWTH 4 DAYS Performed at [facility], [address], [place], [place] [address]    Report Status PENDING  Incomplete  Culture, blood (  routine x 2)     Status: None (Preliminary result)   Collection Time: 03/31/19  2:40 PM   Specimen: BLOOD  Result Value Ref Range Status   Specimen Description BLOOD BLOOD LEFT HAND  Final   Special Requests   Final    BOTTLES DRAWN AEROBIC AND ANAEROBIC Blood Culture adequate volume   Culture   Final    NO GROWTH 4 DAYS Performed at [facility], [address] [place], [place] [address]    Report Status PENDING  Incomplete  SARS CORONAVIRUS 2 (TAT 6-24 HRS) Nasopharyngeal Nasopharyngeal Swab     Status: None   Collection Time: 03/31/19  2:51 PM   Specimen: Nasopharyngeal Swab  Result Value Ref Range Status   SARS Coronavirus 2 NEGATIVE NEGATIVE Final    Comment: (NOTE) SARS-CoV-2 target nucleic acids are NOT DETECTED. The SARS-CoV-2 RNA is generally detectable in upper and lower respiratory specimens during the acute phase of infection. Negative results do not preclude SARS-CoV-2 infection, do not rule out  co-infections with other pathogens, and should not be used as the sole basis for treatment or other patient management decisions. Negative results must be combined with clinical observations, patient history, and epidemiological information. The expected result is Negative. Fact Sheet for Patients: SugarRoll.be Fact Sheet for Healthcare Providers: https://www.woods-mathews.com/ This test is not yet approved or cleared by the [place] FDA and  has been authorized for detection and/or diagnosis of SARS-CoV-2 by FDA under an Emergency Use Authorization (EUA). This EUA will remain  in effect (meaning this test can be used) for the duration of the COVID-19 declaration under Section 56 4(b)(1) of the Act, 21 U.S.C. section 360bbb-3(b)(1), unless the authorization is terminated or revoked sooner. Performed at [facility], [place] [address], [place],  [address]          Radiology Studies: No results found.      Scheduled Meds: . aspirin  81 mg Oral Daily  . Chlorhexidine Gluconate Cloth  6 each Topical Daily  . doxycycline  100 mg Oral Q12H  . enoxaparin (LOVENOX) injection  40 mg Subcutaneous Q24H  . gabapentin  300 mg Oral QID  . lamoTRIgine  200 mg Oral BID  . lamoTRIgine  25 mg Oral BID  . mouth rinse  15 mL Mouth Rinse BID  . pantoprazole  40 mg Oral Daily  . sodium chloride flush  3 mL Intravenous Q12H  . traZODone  50 mg Oral QHS   Continuous Infusions: . sodium chloride Stopped (04/02/19 1102)     LOS: 3 days    Time spent: 90 min    Nicolette Bang, MD [facility]  If 7PM-7AM, please contact night-coverage  04/04/2019, 1:52 PM

## 2019-04-04 NOTE — Progress Notes (Signed)
Physical Therapy Treatment Patient Details Name: Jeremiah [name]. MRN: [id number] DOB: [birth year] Today's Date: 04/04/2019    History of Present Illness  78 y.o. male with Past medical history of rheumatoid arthritis on immunosuppressive therapy, seizures, HTN, chronic lymphedema.  Went to [facility] last week with cellulitis and got antibiotics, however LE swelling had continued along with increased difficulty with mobility and strength.    PT Comments    Pt in bed upon arrival with heavy lean to left in bed  Attempting to eat fruit plate but had lower dentures on plate and uppers falling out while trying to put pineapple in his mouth.  Assisted and dentures placed back in container for safe keeping as he stated he was finished eating at this time.  Repositioned to a more upright position.  Assisted with urinal to void.  Attempted supine ex as below but pt with pain BLE's limiting his ability to participate.  He reported being fatigued and sleepy.  Out of bed mobility deferred at this time.    Follow Up Recommendations  SNF     Equipment Recommendations  None recommended by PT    Recommendations for Other Services       Precautions / Restrictions Precautions Precautions: Fall    Mobility  Bed Mobility               General bed mobility comments: deferred due to pain  Transfers                    Ambulation/Gait                 Stairs             Wheelchair Mobility    Modified Rankin (Stroke Patients Only)       Balance                                            Cognition Arousal/Alertness: Awake/alert Behavior During Therapy: WFL for tasks assessed/performed Overall Cognitive Status: Within Functional Limits for tasks assessed                                        Exercises Other Exercises Other Exercises: supine AAROM for ankle pumps, heel slides, ab/add and slr x 10 limited by pain    General  Comments        Pertinent Vitals/Pain Pain Assessment: Faces Faces Pain Scale: Hurts even more Pain Location: BLE general pain Pain Descriptors / Indicators: Sore;Aching Pain Intervention(s): Limited activity within patient's tolerance;Monitored during session;Repositioned    Home Living                      Prior Function            PT Goals (current goals can now be found in the care plan section) Progress towards PT goals: Not progressing toward goals - comment    Frequency    Min 2X/week      PT Plan Current plan remains appropriate    Co-evaluation              AM-PAC PT "6 Clicks" Mobility   Outcome Measure  Help needed turning from your back to your side while in a flat bed without using  bedrails?: Total Help needed moving from lying on your back to sitting on the side of a flat bed without using bedrails?: Total Help needed moving to and from a bed to a chair (including a wheelchair)?: Total Help needed standing up from a chair using your arms (e.g., wheelchair or bedside chair)?: Total Help needed to walk in hospital room?: Total Help needed climbing 3-5 steps with a railing? : Total 6 Click Score: 6    End of Session   Activity Tolerance: Patient limited by pain Patient left: with bed alarm set;with call bell/phone within reach;in bed         Time: 0917-0925 PT Time Calculation (min) (ACUTE ONLY): 8 min  Charges:  $Therapeutic Exercise: 8-22 mins                     Chesley Noon, PTA 04/04/19, 12:06 PM

## 2019-04-05 DIAGNOSIS — I878 Other specified disorders of veins: Secondary | ICD-10-CM

## 2019-04-05 DIAGNOSIS — L03115 Cellulitis of right lower limb: Principal | ICD-10-CM

## 2019-04-05 DIAGNOSIS — M069 Rheumatoid arthritis, unspecified: Secondary | ICD-10-CM

## 2019-04-05 DIAGNOSIS — Z87891 Personal history of nicotine dependence: Secondary | ICD-10-CM

## 2019-04-05 DIAGNOSIS — M1990 Unspecified osteoarthritis, unspecified site: Secondary | ICD-10-CM

## 2019-04-05 DIAGNOSIS — L03116 Cellulitis of left lower limb: Secondary | ICD-10-CM

## 2019-04-05 DIAGNOSIS — Z7982 Long term (current) use of aspirin: Secondary | ICD-10-CM

## 2019-04-05 DIAGNOSIS — L89309 Pressure ulcer of unspecified buttock, unspecified stage: Secondary | ICD-10-CM

## 2019-04-05 DIAGNOSIS — D61818 Other pancytopenia: Secondary | ICD-10-CM

## 2019-04-05 DIAGNOSIS — Z7901 Long term (current) use of anticoagulants: Secondary | ICD-10-CM

## 2019-04-05 DIAGNOSIS — Z79899 Other long term (current) drug therapy: Secondary | ICD-10-CM

## 2019-04-05 DIAGNOSIS — I10 Essential (primary) hypertension: Secondary | ICD-10-CM

## 2019-04-05 DIAGNOSIS — Z792 Long term (current) use of antibiotics: Secondary | ICD-10-CM

## 2019-04-05 DIAGNOSIS — L8990 Pressure ulcer of unspecified site, unspecified stage: Secondary | ICD-10-CM | POA: Diagnosis not present

## 2019-04-05 DIAGNOSIS — R5383 Other fatigue: Secondary | ICD-10-CM

## 2019-04-05 DIAGNOSIS — I890 Lymphedema, not elsewhere classified: Secondary | ICD-10-CM

## 2019-04-05 DIAGNOSIS — L89159 Pressure ulcer of sacral region, unspecified stage: Secondary | ICD-10-CM

## 2019-04-05 LAB — CBC WITH DIFFERENTIAL/PLATELET
Abs Immature Granulocytes: 0.11 10*3/uL — ABNORMAL HIGH (ref 0.00–0.07)
Basophils Absolute: 0 10*3/uL (ref 0.0–0.1)
Basophils Relative: 1 %
Eosinophils Absolute: 0.1 10*3/uL (ref 0.0–0.5)
Eosinophils Relative: 7 %
HCT: 27 % — ABNORMAL LOW (ref 39.0–52.0)
Hemoglobin: 8.9 g/dL — ABNORMAL LOW (ref 13.0–17.0)
Immature Granulocytes: 12 %
Lymphocytes Relative: 53 %
Lymphs Abs: 0.5 10*3/uL — ABNORMAL LOW (ref 0.7–4.0)
MCH: 28.1 pg (ref 26.0–34.0)
MCHC: 33 g/dL (ref 30.0–36.0)
MCV: 85.2 fL (ref 80.0–100.0)
Monocytes Absolute: 0 10*3/uL — ABNORMAL LOW (ref 0.1–1.0)
Monocytes Relative: 4 %
Neutro Abs: 0.2 10*3/uL — ABNORMAL LOW (ref 1.7–7.7)
Neutrophils Relative %: 23 %
Platelets: 81 10*3/uL — ABNORMAL LOW (ref 150–400)
RBC: 3.17 MIL/uL — ABNORMAL LOW (ref 4.22–5.81)
RDW: 17.2 % — ABNORMAL HIGH (ref 11.5–15.5)
Smear Review: DECREASED
WBC: 1 10*3/uL — CL (ref 4.0–10.5)
nRBC: 0 % (ref 0.0–0.2)

## 2019-04-05 LAB — BASIC METABOLIC PANEL
Anion gap: 7 (ref 5–15)
BUN: 24 mg/dL — ABNORMAL HIGH (ref 8–23)
CO2: 28 mmol/L (ref 22–32)
Calcium: 8.5 mg/dL — ABNORMAL LOW (ref 8.9–10.3)
Chloride: 109 mmol/L (ref 98–111)
Creatinine, Ser: 0.91 mg/dL (ref 0.61–1.24)
GFR calc Af Amer: >60 mL/min (ref >60)
GFR calc non Af Amer: >60 mL/min (ref >60)
Glucose, Bld: 105 mg/dL — ABNORMAL HIGH (ref 70–99)
Potassium: 3.7 mmol/L (ref 3.5–5.1)
Sodium: 144 mmol/L (ref 135–145)

## 2019-04-05 LAB — RETICULOCYTES
RBC.: 2.93 MIL/uL — ABNORMAL LOW (ref 4.22–5.81)
Retic Count, Absolute: 4.4 10*3/uL — ABNORMAL LOW (ref 19.0–186.0)
Retic Ct Pct: <0.4 % — ABNORMAL LOW (ref 0.4–3.1)

## 2019-04-05 LAB — LACTATE DEHYDROGENASE: LDH: 264 U/L — ABNORMAL HIGH (ref 98–192)

## 2019-04-05 MED ORDER — LORAZEPAM 2 MG/ML IJ SOLN
1.0000 mg | Freq: Once | INTRAMUSCULAR | Status: AC
  Administered 2019-04-05: 1 mg via INTRAVENOUS
  Filled 2019-04-05: qty 1

## 2019-04-05 NOTE — Progress Notes (Addendum)
PROGRESS NOTE    Jeremiah [name].  [id number] DOB: Oct 24, 1940 DOA: 03/31/2019 PCP: Derinda Late, MD   Brief Narrative:  Patient is a 78 year old white male with a past no history of rheumatoid arthritis on immunosuppressive therapy, seizures, hypertension, chronic lymphedema who presented with worsening leg infection. Patient was seen at the ER at [facility] treated with outpatient medications but because of worsening clinical presentation he came to the ER and was admitted for further eval and treatment   Assessment & Plan:   Principal Problem:   Cellulitis Active Problems:   Rheumatoid arthritis ([employer])   Lymphedema   Cellulitis superimposed on venous stasis changes associated lymphedema. No change, Narrowedantibiotics. Follow-up culturesneg, CRP elevated as expected, monitor renal function closely which has improved from admission creatinine 3.12 to 0.9  Delirium: Patient with confusion overnight did receive Ativan, at baseline now conversing normally this a.m. although family reports he was confused this afternoon, avoid anxiolytics if possible, avoid antihistamines, frequent day and night orienting.  New pancytopenia with neutropenia: Unclear etiology white blood cell count 3>2.9>2.2>1 (5.18 Feb 2018) added neutropenic precautions, requested consultation with hematology oncology, recheck CBC DIFF in the morning  Acute kidney injury: Suspect was likely prerenal in nature,patient received IV albumin, improving, improved 0.9  Rheumatoid arthritis: continue holding immunosuppressants currently  Neuropathy: Continue gabapentin   Seizure disorder: Continue Lamictal no evidence of breakthrough seizures    Pressure ulcers present on admission. These were bilateral buttock as well as mid sacrum: Wound care was consultedappreciate their recommendations  DVT prophylaxis: Lovenox SQ  Code Status: FULL    Code Status Orders  (From admission, onward)        Start     Ordered   03/31/19 1819  Full code  Continuous     03/31/19 1819        Code Status History    Date Active Date Inactive Code Status Order ID Comments User Context   02/20/2018 2220 02/24/2018 2019 Full Code [id number]  Lance Coon, MD ED   05/23/2015 2221 05/26/2015 1741 Full Code [id number]  Hower, Aaron Mose, MD ED   Advance Care Planning Activity     Family Communication: Discussed with wife in detail at bedside, call d-in-law christie [phone] with updates Disposition Plan:   Patient remained inpatient continue treat with IV antibiotics, evaluation of pancytopenia, treatment for delirium.  Patient not yet medically stable for discharge Consults called: HEM ONC Admission status: Inpatient   Consultants:   [facility]  Procedures:  US Renal  Result Date: 04/01/2019 CLINICAL DATA:  Acute kidney injury EXAM: RENAL / URINARY TRACT ULTRASOUND COMPLETE COMPARISON:  None. FINDINGS: Right Kidney: Renal measurements: 11.1 x 4.3 x 4.5 cm = volume: 110 mL . Echogenicity within normal limits. No mass or hydronephrosis visualized. Left Kidney: Renal measurements: 11.3 x 5.6 x 4.7 cm = volume: 155 mL. Echogenicity within normal limits. There is a thin walled anechoic simple cyst at the mid to inferior pole the left kidney measuring 2.1 x 1.6 x 2.2 cm. No solid mass or hydronephrosis visualized. Bladder: Decompressed with Foley catheter in place. Other: None. IMPRESSION: Negative for obstructive uropathy. Electronically Signed   By: Davina Poke M.D.   On: 04/01/2019 10:11     Antimicrobials:   Vancomycin discontinued on day 3  Started doxycycline100mg  p.o. twice daily >11/13 day 3    Subjective: Patient with episode of combative behavior consistent with delirium last night Received Ativan 1 mg IV  Objective: Vitals:   04/04/19  0330 04/04/19 1200 04/04/19 2152 04/05/19 1327  BP: (!) 141/81 140/85 115/81 108/80  Pulse: (!) 108 (!) 105 (!) 108 92  Resp: 20 20 20 20   Temp:  98.6 F (37 C) 98.6 F (37 C) 98.6 F (37 C) 98.4 F (36.9 C)  TempSrc: Oral Oral Oral Oral  SpO2: 100% 100% 100% 100%  Weight:      Height:        Intake/Output Summary (Last 24 hours) at 04/05/2019 1456 Last data filed at 04/04/2019 1730 Gross per 24 hour  Intake -  Output 300 ml  Net -300 ml   Filed Weights   04/01/19 0500 04/02/19 0419 04/03/19 0528  Weight: 117.9 kg 117 kg 113 kg    Examination:  General exam: Appears calm and comfortable although moderately confused Respiratory system: Clear to auscultation. Respiratory effort normal. Cardiovascular system: S1 & S2 heard, RRR. No JVD, murmurs, rubs, gallops or clicks. No pedal edema. Gastrointestinal system: Abdomen is nondistended, soft and nontender. No organomegaly or masses felt. Normal bowel sounds heard. Central nervous system: Alert and oriented. No focal neurological deficits. Extremities: Chronic lymphedema, venous stasis with hemosiderin staining Skin: Mild erythema consistent with cellulitis superimposed on lymphedema no rashes, lesions or ulcers Psychiatry: Judgement and insight moderately impaired. Mood & affect appropriate.     Data Reviewed: I have personally reviewed following labs and imaging studies  CBC: Recent Labs  Lab 03/31/19 1440 04/01/19 0536 04/04/19 0502 04/05/19 0826  WBC 3.0* 2.9* 2.2* 1.0*  NEUTROABS 1.6*  --  1.5* 0.2*  HGB 10.0* 8.5* 9.2* 8.9*  HCT 30.5* 26.0* 27.8* 27.0*  MCV 86.4 85.2 85.3 85.2  PLT 206 165 107* 81*   Basic Metabolic Panel: Recent Labs  Lab 03/31/19 1440 04/01/19 0536 04/02/19 0428 04/05/19 0826  NA 139 140  --  144  K 3.3* 3.2*  --  3.7  CL 99 104  --  109  CO2 28 28  --  28  GLUCOSE 103* 94  --  105*  BUN 46* 39*  --  24*  CREATININE 3.12* 2.12* 1.38* 0.91  CALCIUM 8.9 8.4*  --  8.5*   GFR: Estimated Creatinine Clearance: 88.3 mL/min (by C-G formula based on SCr of 0.91 mg/dL). Liver Function Tests: Recent Labs  Lab 04/01/19 0536  AST  37  ALT 29  ALKPHOS 66  BILITOT 0.7  PROT 5.4*  ALBUMIN 2.8*   No results for input(s): LIPASE, AMYLASE in the last 168 hours. No results for input(s): AMMONIA in the last 168 hours. Coagulation Profile: No results for input(s): INR, PROTIME in the last 168 hours. Cardiac Enzymes: No results for input(s): CKTOTAL, CKMB, CKMBINDEX, TROPONINI in the last 168 hours. BNP (last 3 results) No results for input(s): PROBNP in the last 8760 hours. HbA1C: No results for input(s): HGBA1C in the last 72 hours. CBG: No results for input(s): GLUCAP in the last 168 hours. Lipid Profile: No results for input(s): CHOL, HDL, LDLCALC, TRIG, CHOLHDL, LDLDIRECT in the last 72 hours. Thyroid Function Tests: No results for input(s): TSH, T4TOTAL, FREET4, T3FREE, THYROIDAB in the last 72 hours. Anemia Panel: Recent Labs    04/05/19 1220  RETICCTPCT <0.4*   Sepsis Labs: Recent Labs  Lab 03/31/19 1440  LATICACIDVEN 0.9    Recent Results (from the past 240 hour(s))  Culture, blood (routine x 2)     Status: None (Preliminary result)   Collection Time: 03/31/19  2:40 PM   Specimen: BLOOD  Result Value Ref Range  Status   Specimen Description BLOOD LEFT ANTECUBITAL  Final   Special Requests   Final    BOTTLES DRAWN AEROBIC AND ANAEROBIC Blood Culture adequate volume   Culture   Final    NO GROWTH 4 DAYS Performed at [facility], [address], [place], [place] [address]    Report Status PENDING  Incomplete  Culture, blood (routine x 2)     Status: None (Preliminary result)   Collection Time: 03/31/19  2:40 PM   Specimen: BLOOD  Result Value Ref Range Status   Specimen Description BLOOD BLOOD LEFT HAND  Final   Special Requests   Final    BOTTLES DRAWN AEROBIC AND ANAEROBIC Blood Culture adequate volume   Culture   Final    NO GROWTH 4 DAYS Performed at [facility], [address] [place], [place] [address]    Report Status PENDING  Incomplete  SARS CORONAVIRUS 2  (TAT 6-24 HRS) Nasopharyngeal Nasopharyngeal Swab     Status: None   Collection Time: 03/31/19  2:51 PM   Specimen: Nasopharyngeal Swab  Result Value Ref Range Status   SARS Coronavirus 2 NEGATIVE NEGATIVE Final    Comment: (NOTE) SARS-CoV-2 target nucleic acids are NOT DETECTED. The SARS-CoV-2 RNA is generally detectable in upper and lower respiratory specimens during the acute phase of infection. Negative results do not preclude SARS-CoV-2 infection, do not rule out co-infections with other pathogens, and should not be used as the sole basis for treatment or other patient management decisions. Negative results must be combined with clinical observations, patient history, and epidemiological information. The expected result is Negative. Fact Sheet for Patients: SugarRoll.be Fact Sheet for Healthcare Providers: https://www.woods-mathews.com/ This test is not yet approved or cleared by the [place] FDA and  has been authorized for detection and/or diagnosis of SARS-CoV-2 by FDA under an Emergency Use Authorization (EUA). This EUA will remain  in effect (meaning this test can be used) for the duration of the COVID-19 declaration under Section 56 4(b)(1) of the Act, 21 U.S.C. section 360bbb-3(b)(1), unless the authorization is terminated or revoked sooner. Performed at [facility], [place] [address], [place], [place] [address]          Radiology Studies: No results found.      Scheduled Meds: . aspirin  81 mg Oral Daily  . Chlorhexidine Gluconate Cloth  6 each Topical Daily  . doxycycline  100 mg Oral Q12H  . enoxaparin (LOVENOX) injection  40 mg Subcutaneous Q24H  . gabapentin  300 mg Oral QID  . lamoTRIgine  200 mg Oral BID  . lamoTRIgine  25 mg Oral BID  . mouth rinse  15 mL Mouth Rinse BID  . pantoprazole  40 mg Oral Daily  . sodium chloride flush  3 mL Intravenous Q12H  . traZODone  50 mg Oral QHS   Continuous  Infusions: . sodium chloride Stopped (04/02/19 1102)     LOS: 4 days    Time spent: 51 min    Nicolette Bang, MD [facility]  If 7PM-7AM, please contact night-coverage  04/05/2019, 2:56 PM

## 2019-04-05 NOTE — Progress Notes (Signed)
Informed Dr. Wyonia Hough of critical WBC - 1.0

## 2019-04-05 NOTE — Consult Note (Signed)
[facility]  Date of admission:  03/31/2019  Inpatient day:  04/05/2019  Consulting physician:  Dr. Nicolette Bang   Reason for Consultation:  Pancytopenia  Chief Complaint: Jeremiah [name]. is a 78 y.o. male with rheumatoid arthritis and venous stasis/chronic lymphedema who was admitted through the ER with bilateral lower extremity cellultis.  HPI:  The patient was seen in the ER  At [facility] on 03/26/2019 with cellulitis of his lower extremities.  He was started on ciprofloxacin and clindamycin.  Labs included a hematocrit 33.4, hemoglobin 10.7, platelets 128,000, WBC 3700.  Creatinine was 1.4, albumen 3.4, and protein 6.8. He continued to take his immunosuppressant medications (Plaquenil po daily, MTX po on Mondays, Enbrel SQ on Fridays) for rheumatoid arthritis.  He denied any other new medications or herbal products. Diet and fluid intake was normal prior to admission.  He was admitted to [facility] on Tuesday, 11/10/22020, with bilateral lower extremity cellulitis for failure of outpatient therapy.  He was noted to have pressure ulcers on his buttocks and mid sacrum.  He was started on vancomycin.  Plaquenil, MTX, Enbrel were held.  He was started on heparin for DVT prophylaxis.  Patient was scheduled for discharge on 04/02/2019 to complete a course of minocycline.  Notes indicate that patient remained in hospital secondary to inability to stand and pivot to wheelchair.  Plan was changed to discharge to a nursing facility rather than home. Patient subsequently on doxycycline. Tmax during hospitalization has been 99.2 on 04/02/2019.  Labs have been followed: 03/26/2019: Hematocrit 33.4.  Hemoglobin 10.7.  MCV 89.0.  Platelets 128,000, WBC 3700.  Cr 1.4. 03/31/2019: Hematocrit 30.5.  Hemoglobin 10.0.  MCV 86.4.  Platelets 206,000.  WBC 3000 with an ANC of 1600.  Cr 3.12. 04/01/2019: Hematocrit 26.0.  Hemoglobin 8.5.  MCV 85.2.  Platelets 165,000.  WBC 2900.  Cr  2.12. 04/04/2019: Hematocrit 27.8.  Hemoglobin 9.2.  MCV 85.3.  Platelets 107,000.  WBC 2200 with an ANC of 1500.  Cr 1.38. 04/05/2019: Hematocrit 27.0.  Hemoglobin 8.9.  MCV 85.2.  Platelets 81,000.  WBC 1000 with an ANC of 200.  Cr 0.91.  Symptomatically, he notes mouth tenderness and lower abdominal discomfort.  He has sores on his bottom for sitting and laying in bed for long periods of time.  His wife states that before admission, he was sitting in a wheelchair as he couldn't get up or walk.    Past Medical History:  Diagnosis Date  . Allergy   . Arthritis   . Hypertension   . Rheumatoid arthritis ([place])   . Seizures ([place])   . Sepsis ([place]) 05/23/2015    Past Surgical History:  Procedure Laterality Date  . back surgery five times  04/2016  . CARPAL TUNNEL RELEASE    . HERNIA REPAIR    . JOINT REPLACEMENT    . knee replacement right    . right hip replacement    . rotator cuff surgery    . SPINE SURGERY      Family History  Problem Relation Age of Onset  . Hypertension Other   . Hypertension Mother   . Dementia Mother   . Dementia Father   . Prostate cancer Neg Hx   . Bladder Cancer Neg Hx   . Kidney cancer Neg Hx     Social History:  reports that he has quit smoking. His smoking use included cigarettes. He has a 20.00 pack-year smoking history. He has never used smokeless tobacco.  He reports that he does not drink alcohol. No history on file for drug.  He denies any exposure to radiation or toxins.  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  He lives in [place].  He notes that he will be discharged to [facility] this week.  He is accompanied by his wife, Jeremiah [name], today.  Allergies:  Allergies  Allergen Reactions  . Cefoperazone Anaphylaxis  . Penicillins Anaphylaxis and Other (See Comments)    Has patient had a PCN reaction causing immediate rash, facial/tongue/throat swelling,  SOB or lightheadedness with hypotension: Yes Has patient had a PCN reaction causing severe rash involving mucus membranes or skin necrosis: No Has patient had a PCN reaction that required hospitalization No Has patient had a PCN reaction occurring within the last 10 years: No If all of the above answers are "NO", then may proceed with Cephalosporin use.  . Latex Rash    Medications Prior to Admission  Medication Sig Dispense Refill  . aspirin 81 MG chewable tablet Chew 81 mg by mouth daily.    . Cinnamon 500 MG capsule Take 500 mg by mouth 2 (two) times daily.    . ciprofloxacin (CIPRO) 500 MG tablet Take 500 mg by mouth 2 (two) times daily.    . clindamycin (CLEOCIN) 300 MG capsule Take 300 mg by mouth 4 (four) times daily. For 14 days    . etanercept (ENBREL SURECLICK) 50 MG/ML injection Inject 50 mg into the skin every Friday.     . folic acid (FOLVITE) 1 MG tablet Take 1 mg by mouth daily.    Marland Kitchen gabapentin (NEURONTIN) 300 MG capsule Take 1 capsule (300 mg total) by mouth 4 (four) times daily. QHS 120 capsule 0  . hydroxychloroquine (PLAQUENIL) 200 MG tablet Take 400 mg by mouth at bedtime.    . lamoTRIgine (LAMICTAL) 200 MG tablet Take 200 mg by mouth 2 (two) times daily.    Marland Kitchen lamoTRIgine (LAMICTAL) 25 MG tablet Take 25 mg by mouth 2 (two) times daily.    . magnesium oxide (MAG-OX) 400 MG tablet Take 400 mg by mouth daily.    . methotrexate (RHEUMATREX) 2.5 MG tablet TAKE 10 TABS BY MOUTH ONCE A WEEK    . nystatin-triamcinolone ointment (MYCOLOG) Apply 1 application topically 2 (two) times daily. 30 g 2  . omeprazole (PRILOSEC) 40 MG capsule Take 40 mg by mouth daily.     . traZODone (DESYREL) 50 MG tablet Take 50 mg by mouth at bedtime.    Marland Kitchen acetaminophen (TYLENOL) 325 MG tablet Take 2 tablets (650 mg total) by mouth every 6 (six) hours as needed for mild pain (or Fever >/= 101).    . polyethylene glycol (MIRALAX / GLYCOLAX) packet Take 17 g by mouth daily. 14 each 0    Review of  Systems: GENERAL:  Fatigue.  No fevers, sweats or weight loss. PERFORMANCE STATUS (ECOG):  2 HEENT:  Mouth sore.  Lips dried with old blood.  No visual changes, runny nose, or sore throat. Lungs:  No shortness of breath or cough.  No hemoptysis. Cardiac:  No chest pain, palpitations, orthopnea, or PND. GI:  Lower abdominal discomfort.  Constipation.  No nausea, vomiting, diarrhea, melena or hematochezia. GU:  No urgency, frequency, dysuria, or hematuria. Musculoskeletal:  No back pain.  No joint pain.  No muscle tenderness. Extremities:  Lower extremity swelling. Skin:  Bilateral  lower extremity cellulitis, improving. Skin breakdown on buttocks and sacrum. Neuro:  Decreased mentation per his wife.  Some confusion.  No headache, focal numbness or weakness.  Unable to stand. Endocrine:  No diabetes, thyroid issues, hot flashes or night sweats. Psych:  No mood changes, depression or anxiety. Pain:  Lower abdominal discomfort.  Physical Exam:  Blood pressure 115/81, pulse (!) 108, temperature 98.6 F (37 C), temperature source Oral, resp. rate 20, height 6' (1.829 m), weight 249 lb 1.9 oz (113 kg), SpO2 100 %.  GENERAL:  well developed, well nourished elderly gentleman sitting comfortably on the medical unit in no acute distress. MENTAL STATUS:  Alert and oriented to person only.  He is unsure where he is (believes he is in church), but knows he is scheduled to go to [facility].  He knows it is Sunday in 2020.  He has difficulty with the month (month is "20"). HEAD: Near alopecia.  Normocephalic, atraumatic, face symmetric, no Cushingoid features. EYES:  Blue eyes.  Pupils equal round and reactive to light and accomodation.  No conjunctivitis or scleral icterus. ENT:  Lips dried with crusted blood.  Mild mucositis/ulceration.  Tongue normal. Mucous membranes moist.  RESPIRATORY:  Clear to auscultation anteriorly without rales, wheezes or rhonchi. CARDIOVASCULAR:  Regular rate and rhythm  without murmur, rub or gallop. ABDOMEN:  Soft, slightly tender in suprapubic region without guarding or rebound tenderness.  Active bowel sounds and no appreciable hepatosplenomegaly.  No masses. SKIN:  Bilateral lower extremity erythema extending to marked line and associated with chronic edema. EXTREMITIES: Bilateral lower extremity 2+ edema.  No palpable cords. LYMPH NODES: No palpable cervical, supraclavicular, axillary or inguinal adenopathy  NEUROLOGICAL: Engaging.  He does not know who is the president. He remembers 3 of 3 words immediately, but only 1 of 3 words in 10 minutes even with prompting.  Moves all extremities.  He follows commands. PSYCH:  Appropriate.   Results for orders placed or performed during the hospital encounter of 03/31/19 (from the past 48 hour(s))  CBC with Differential/Platelet     Status: Abnormal   Collection Time: 04/04/19  5:02 AM  Result Value Ref Range   WBC 2.2 (L) 4.0 - 10.5 K/uL   RBC 3.26 (L) 4.22 - 5.81 MIL/uL   Hemoglobin 9.2 (L) 13.0 - 17.0 g/dL   HCT 27.8 (L) 39.0 - 52.0 %   MCV 85.3 80.0 - 100.0 fL   MCH 28.2 26.0 - 34.0 pg   MCHC 33.1 30.0 - 36.0 g/dL   RDW 17.2 (H) 11.5 - 15.5 %   Platelets 107 (L) 150 - 400 K/uL    Comment: Immature Platelet Fraction may be clinically indicated, consider ordering this additional test [id number]    nRBC 0.0 0.0 - 0.2 %   Neutrophils Relative % 69 %   Neutro Abs 1.5 (L) 1.7 - 7.7 K/uL   Lymphocytes Relative 21 %   Lymphs Abs 0.5 (L) 0.7 - 4.0 K/uL   Monocytes Relative 2 %   Monocytes Absolute 0.0 (L) 0.1 - 1.0 K/uL   Eosinophils Relative 6 %   Eosinophils Absolute 0.1 0.0 - 0.5 K/uL   Basophils Relative 1 %   Basophils Absolute 0.0 0.0 - 0.1 K/uL   WBC Morphology MORPHOLOGY UNREMARKABLE    RBC Morphology MORPHOLOGY UNREMARKABLE    Immature Granulocytes 1 %   Abs Immature Granulocytes 0.01 0.00 - 0.07 K/uL   Giant PLTs PRESENT     Comment: Performed at [facility],  [place], [place] [address]  CBC with Differential/Platelet     Status: Abnormal   Collection Time: 04/05/19  8:26 AM  Result Value Ref Range   WBC 1.0 (LL) 4.0 - 10.5 K/uL    Comment: CRITICAL RESULT CALLED TO, READ BACK BY AND VERIFIED WITH: JULIE BRYAN @1035  04/05/19 MJU    RBC 3.17 (L) 4.22 - 5.81 MIL/uL   Hemoglobin 8.9 (L) 13.0 - 17.0 g/dL   HCT 27.0 (L) 39.0 - 52.0 %   MCV 85.2 80.0 - 100.0 fL   MCH 28.1 26.0 - 34.0 pg   MCHC 33.0 30.0 - 36.0 g/dL   RDW 17.2 (H) 11.5 - 15.5 %   Platelets 81 (L) 150 - 400 K/uL    Comment: Immature Platelet Fraction may be clinically indicated, consider ordering this additional test [id number]    nRBC 0.0 0.0 - 0.2 %   Neutrophils Relative % 23 %   Neutro Abs 0.2 (L) 1.7 - 7.7 K/uL   Lymphocytes Relative 53 %   Lymphs Abs 0.5 (L) 0.7 - 4.0 K/uL   Monocytes Relative 4 %   Monocytes Absolute 0.0 (L) 0.1 - 1.0 K/uL   Eosinophils Relative 7 %   Eosinophils Absolute 0.1 0.0 - 0.5 K/uL   Basophils Relative 1 %   Basophils Absolute 0.0 0.0 - 0.1 K/uL   WBC Morphology MORPHOLOGY UNREMARKABLE    RBC Morphology MORPHOLOGY UNREMARKABLE    Smear Review PLATELETS APPEAR DECREASED    Immature Granulocytes 12 %   Abs Immature Granulocytes 0.11 (H) 0.00 - 0.07 K/uL    Comment: Performed at [facility], [address] [place], [place] [id number]  Basic metabolic panel     Status: Abnormal   Collection Time: 04/05/19  8:26 AM  Result Value Ref Range   Sodium 144 135 - 145 mmol/L   Potassium 3.7 3.5 - 5.1 mmol/L   Chloride 109 98 - 111 mmol/L   CO2 28 22 - 32 mmol/L   Glucose, Bld 105 (H) 70 - 99 mg/dL   BUN 24 (H) 8 - 23 mg/dL   Creatinine, Ser 0.91 0.61 - 1.24 mg/dL   Calcium 8.5 (L) 8.9 - 10.3 mg/dL   GFR calc non Af Amer >60 >60 mL/min   GFR calc Af Amer >60 >60 mL/min   Anion gap 7 5 - 15    Comment: Performed at [facility], [address] [place], [place] [address]   No results found.  Assessment:  The patient is a  78 y.o. gentleman with rheumatoid arthritis who was admitted through the ER with bilateral lower extremity cellulitis unresponsive to outpatient management.  Cultures are negative to date.  He was initially treated in the outpatient department with ciprofloxacin and clindamycin.  On admission he was switched to vancomycin and then doxycycline 04/03/2019.  He was rheumatoid arthritis and receives oral MTX weekly, Enbrel SQ weekly on Fridays, and Plaquenil.  Creatinine was 3.12 on admission and is currently 0.91.  Peripheral smear reveals leukopenia and thrombocytopenia.  There was no platelet clumping.  There were no blasts or schistocytes.  There were scattered target cells and teardrop cells..  Symptomatically, he has mouth sores and tenderness.  Exam reveals no adenopathy or hepatosplenomegaly.  He has bilateral lower extremity cellulitis.  Plan:   1.   Pancytopenia  Etiology felt multi-factorial, but likely due to methotrexate.  Suspect patient has poor marrow reserve given baseline counts with some component of myelosuppression due  to infection.   He was taking Plaquenil, MTX, and Enbrel until his admission on 03/31/2019.   Each medication associated to some degree with myelosuppression.   He was taking Plaquenil daily, MTX weekly on Mondays, and Enbrel weekly on Fridays.    MTX thus given on 03/30/2019 and Enbrel on 03/27/2019.  Patient noted to have creatinine of 3.12 on 03/31/2019.  Suspect increased toxicity of MTX from elevated creatinine (decreased CrCl).  Patient has resultant stomatitis from MTX.   Recommend oral and lip care.  Suspect MTX level is normal with improvement in renal function.   MTX level ordered STAT.  Retic count low (< 0.4%) c/w marrow suppression.  Previous antibiotics (ciprofloxacin, clindamycin, and vancomycin) have low risk of myelosuppression.   Vancomycin 2-12% incidence of neutropenia and pancytopenia < 1%.  Doubt heparin induced thrombocytopenia (HIT) as  other cell lines down.   Check serotonin release assay.  No current evidence of hemolysis.   LDH is slightly elevated.  DAT ordered.  Monitor CBC with diff daily.  Neutropenic precautions.   Thank you for allowing me to participate in [place] Parpart. 's care.  I will follow him closely with you while hospitalized and after discharge in the outpatient department.   Lequita Asal, MD  04/05/2019, 12:30 PM

## 2019-04-05 NOTE — Progress Notes (Signed)
Pt voided several times throughout night saturating sheets. Pt bed linen changed each time. Primary nurse tried to perform oral care on pt. After applying gel to lips pt told nursing staff to stop. Primary nurse informed pt that we were just trying to help. Pt stated " I told you to stop before I pop you" . Primary nurse informed pt once more that we were just trying to help. Nursing staff left the room. Primary nurse to continue to monitor pt.

## 2019-04-05 NOTE — TOC Progression Note (Signed)
Transition of Care (TOC) - Progression Note    Patient Details  Name: Jeremiah [name]. MRN: [id number] Date of Birth: 05/25/1940  Transition of Care [facility]) CM/SW Contact  Latanya Maudlin, RN Phone Number: 04/05/2019, 9:08 AM  Clinical Narrative:   [facility] team received call Friday evening from [place] with [facility]. They will be able to offer a bed for SNF. [facility] was patients first choice. The offer does not reflect in the [place] but [facility] does not take admission on weekends. Magda Paganini mentioned that patient took a medication during the last stay that their pharmacy did not carry. TOC team will touch base with [place] once discharge medications are known. If needed patient will have to bring medication to the facility.    Expected Discharge Plan: [place]    Expected Discharge Plan and Services Expected Discharge Plan: [place]   Discharge Planning Services: CM Consult   Living arrangements for the past 2 months: Single Family Home Expected Discharge Date: 04/02/19                                     Social Determinants of Health (SDOH) Interventions    Readmission Risk Interventions No flowsheet data found.

## 2019-04-05 NOTE — Progress Notes (Signed)
Pt began screaming primary nurse name repeatedly. Primary nurse would go directly to pt room and explain to pt that he is currently in the hospital. Pt seems to a lot more agitated and confused making threats at hospital staff. Primary nurse notified Rufina Falco. Orders received for Ativan 1 mg IV. Pt seems to be calming down after receiving Medication. Primary nurse to continue to monitor.

## 2019-04-06 ENCOUNTER — Ambulatory Visit: Payer: Medicare Other | Admitting: Physician Assistant

## 2019-04-06 LAB — CBC WITH DIFFERENTIAL/PLATELET
Abs Immature Granulocytes: 0 10*3/uL (ref 0.00–0.07)
Basophils Absolute: 0 10*3/uL (ref 0.0–0.1)
Basophils Relative: 1 %
Eosinophils Absolute: 0.1 10*3/uL (ref 0.0–0.5)
Eosinophils Relative: 3 %
HCT: 27.8 % — ABNORMAL LOW (ref 39.0–52.0)
Hemoglobin: 9 g/dL — ABNORMAL LOW (ref 13.0–17.0)
Lymphocytes Relative: 13 %
Lymphs Abs: 0.2 10*3/uL — ABNORMAL LOW (ref 0.7–4.0)
MCH: 27.9 pg (ref 26.0–34.0)
MCHC: 32.4 g/dL (ref 30.0–36.0)
MCV: 86.1 fL (ref 80.0–100.0)
Monocytes Absolute: 0.1 10*3/uL (ref 0.1–1.0)
Monocytes Relative: 5 %
Neutro Abs: 1.3 10*3/uL — ABNORMAL LOW (ref 1.7–7.7)
Neutrophils Relative %: 78 %
Platelets: ADEQUATE 10*3/uL (ref 150–400)
RBC: 3.23 MIL/uL — ABNORMAL LOW (ref 4.22–5.81)
RDW: 17.2 % — ABNORMAL HIGH (ref 11.5–15.5)
Smear Review: UNDETERMINED
WBC: 1.7 10*3/uL — ABNORMAL LOW (ref 4.0–10.5)
nRBC: 0 % (ref 0.0–0.2)

## 2019-04-06 LAB — BASIC METABOLIC PANEL
Anion gap: 9 (ref 5–15)
BUN: 23 mg/dL (ref 8–23)
CO2: 29 mmol/L (ref 22–32)
Calcium: 8.9 mg/dL (ref 8.9–10.3)
Chloride: 110 mmol/L (ref 98–111)
Creatinine, Ser: 0.94 mg/dL (ref 0.61–1.24)
GFR calc Af Amer: >60 mL/min (ref >60)
GFR calc non Af Amer: >60 mL/min (ref >60)
Glucose, Bld: 99 mg/dL (ref 70–99)
Potassium: 3.8 mmol/L (ref 3.5–5.1)
Sodium: 148 mmol/L — ABNORMAL HIGH (ref 135–145)

## 2019-04-06 LAB — URINALYSIS, COMPLETE (UACMP) WITH MICROSCOPIC: Specific Gravity, Urine: 1.025 (ref 1.005–1.030)

## 2019-04-06 LAB — TSH: TSH: 3.279 u[IU]/mL (ref 0.350–4.500)

## 2019-04-06 LAB — DAT, POLYSPECIFIC AHG (ARMC ONLY): Polyspecific AHG test: NEGATIVE

## 2019-04-06 LAB — OSMOLALITY, URINE: Osmolality, Ur: 742 mOsm/kg (ref 300–900)

## 2019-04-06 LAB — FOLATE: Folate: 3.4 ng/mL — ABNORMAL LOW (ref >5.9)

## 2019-04-06 LAB — PLATELET COUNT: Platelets: 60 10*3/uL — ABNORMAL LOW (ref 150–400)

## 2019-04-06 LAB — VITAMIN B12: Vitamin B-12: 606 pg/mL (ref 180–914)

## 2019-04-06 LAB — NA AND K (SODIUM & POTASSIUM), RAND UR
Potassium Urine: 43 mmol/L
Sodium, Ur: 45 mmol/L

## 2019-04-06 LAB — PATHOLOGIST SMEAR REVIEW

## 2019-04-06 LAB — CREATININE, URINE, RANDOM: Creatinine, Urine: 143 mg/dL

## 2019-04-06 MED ORDER — METOPROLOL TARTRATE 25 MG/10 ML ORAL SUSPENSION
12.5000 mg | Freq: Two times a day (BID) | ORAL | Status: DC
  Administered 2019-04-06: 12.5 mg via ORAL
  Filled 2019-04-06 (×2): qty 5

## 2019-04-06 MED ORDER — METOPROLOL TARTRATE 25 MG PO TABS
12.5000 mg | ORAL_TABLET | Freq: Two times a day (BID) | ORAL | Status: DC
  Administered 2019-04-06 – 2019-04-18 (×22): 12.5 mg via ORAL
  Filled 2019-04-06 (×26): qty 1

## 2019-04-06 NOTE — TOC Progression Note (Signed)
Transition of Care (TOC) - Progression Note    Patient Details  Name: Jeremiah [name]. MRN: [id number] Date of Birth: Jul 25, 1940  Transition of Care [facility]) CM/SW Contact  Beverly Sessions, RN Phone Number: 04/06/2019, 4:55 PM  Clinical Narrative:     [employer] fast streamer received from [employer] # [phone] Auth # [id number]  Luis Llorens Torres valid 11/16-11/18  Magda Paganini at [facility] notified   Expected Discharge Plan: [place]    Expected Discharge Plan and Services Expected Discharge Plan: [place]   Discharge Planning Services: CM Consult   Living arrangements for the past 2 months: Single Family Home Expected Discharge Date: 04/02/19                                     Social Determinants of Health (SDOH) Interventions    Readmission Risk Interventions No flowsheet data found.

## 2019-04-06 NOTE — TOC Progression Note (Signed)
Transition of Care (TOC) - Progression Note    Patient Details  Name: Jeremiah [name]. MRN: [id number] Date of Birth: [birth year]  Transition of Care [facility]) CM/SW Contact  Beverly Sessions, RN Phone Number: 04/06/2019, 12:36 PM  Clinical Narrative:    Insurance auth started with [employer].  Pre auth # [id number]  Clinical faxed to [phone]   Expected Discharge Plan: [place]    Expected Discharge Plan and Services Expected Discharge Plan: [place]   Discharge Planning Services: CM Consult   Living arrangements for the past 2 months: Single Family Home Expected Discharge Date: 04/02/19                                     Social Determinants of Health (SDOH) Interventions    Readmission Risk Interventions No flowsheet data found.

## 2019-04-06 NOTE — Consult Note (Signed)
[place] Nurse Consult Note: Reason for Consult: sacrum and "blister"  Discussed with bedside nurse did not find "blister" Patient was seen 04/01/19 for same area, reviewed images from 03/31/19 prior to my assessment today Wound type: moisture associated skin damage in combination with pressure to this area Pressure Injury POA: Yes  Measurement:bilateral gluteal mirror like images; 2-3 partial thickness skin loss  Wound bed:all are clean and pink, moist Drainage (amount, consistency, odor) serosanguinous, no order  Periwound: intact  Dressing procedure/placement/frequency: Add silver hydrofiber for pain and moisture management, top with foam making sure to fold foam and get deep into the gluteal crease.  Change every 3 days and PRN soilage. Positioned patient on his side and reminded him importance of turning off his backside frequently.  Discussed POC with patient and bedside nurse.  Re consult if needed, will not follow at this time. Thanks  Yamato Kopf [employer], RN,CWOCN, CNS, [place] [phone])

## 2019-04-06 NOTE — Progress Notes (Signed)
PROGRESS NOTE    Jeremiah [name].  [id number] DOB: August 28, 1940 DOA: 03/31/2019 PCP: Derinda Late, MD   Brief Narrative:  Patient is a 79 year old white male with a past no history of rheumatoid arthritis on immunosuppressive therapy, seizures, hypertension, chronic lymphedema who presented with worsening leg infection. Patient was seen at the ER at [facility] treated with outpatient medications but because of worsening clinical presentation he came to the ER and was admitted for further eval and treatment   Assessment & Plan:   Principal Problem:   Cellulitis Active Problems:   Rheumatoid arthritis ([place])   Lymphedema   Pressure injury of skin   Other pancytopenia ([place])   Cellulitis superimposed on venous stasis changes associated lymphedema. No change,Narrowedantibiotics. Follow-up culturesneg, CRP elevated as expected, monitor renal function closely which has improved from admission creatinine 3.12 to 0.9  Delirium: Patient with confusion overnight did receive Ativan, at baseline now conversing normally this a.m. although family reports he was confused this afternoon, avoid anxiolytics if possible, avoid antihistamines, frequent day and night orienting.  New pancytopenia with neutropenia: Unclear etiology white blood cell count 3>2.9>2.2>1>1.7 (5.18 Feb 2018) added neutropenic precautions 11/15, requested consultation with hematology oncology Probable MTX toxicity-will follow their recs  Acute kidney injury: Suspect was likely prerenal in nature,patient received IV albumin, improving,improved 0.9  Rheumatoid arthritis:continueholding immunosuppressants currently, heme-onc as noted concerned about methotrexate toxicity  Neuropathy: Continue gabapentin   Seizure disorder: Continue Lamictal no evidence of breakthrough seizures  Pressure ulcers present on admission. These were bilateral buttock as well as mid sacrum: Wound care was consultedappreciate their  recommendations. Continue with silver Hydrofiber, and foam changing every 3 days and as needed for soilage  DVT prophylaxis: Lovenox SQ  Code Status: full Code    Code Status Orders  (From admission, onward)         Start     Ordered   03/31/19 1819  Full code  Continuous     03/31/19 1819        Code Status History    Date Active Date Inactive Code Status Order ID Comments User Context   02/20/2018 2220 02/24/2018 2019 Full Code [id number]  Lance Coon, MD ED   05/23/2015 2221 05/26/2015 1741 Full Code [id number]  Hower, Aaron Mose, MD ED   Advance Care Planning Activity     Family Communication: Left message with daughter-in-law Alyse Low at [phone] Disposition Plan:  Patient remained inpatient for continued treatment with IV antibiotics, continued monitoring and evaluation by subspecialty for pancytopenia, treatment of delirium.  Patient not yet medically stable for discharge Consults called: None Admission status: Inpatient   Consultants:   Hematology oncology  Procedures:  US Renal  Result Date: 04/01/2019 CLINICAL DATA:  Acute kidney injury EXAM: RENAL / URINARY TRACT ULTRASOUND COMPLETE COMPARISON:  None. FINDINGS: Right Kidney: Renal measurements: 11.1 x 4.3 x 4.5 cm = volume: 110 mL . Echogenicity within normal limits. No mass or hydronephrosis visualized. Left Kidney: Renal measurements: 11.3 x 5.6 x 4.7 cm = volume: 155 mL. Echogenicity within normal limits. There is a thin walled anechoic simple cyst at the mid to inferior pole the left kidney measuring 2.1 x 1.6 x 2.2 cm. No solid mass or hydronephrosis visualized. Bladder: Decompressed with Foley catheter in place. Other: None. IMPRESSION: Negative for obstructive uropathy. Electronically Signed   By: Davina Poke M.D.   On: 04/01/2019 10:11     Antimicrobials:   Vancomycin discontinued on day 3  Doxycycline 100 mg p.o. twice daily  day 4   Subjective: Patient's confusion appears to be clearing its  consistent with a delirium No other acute events  Objective: Vitals:   04/04/19 2152 04/05/19 1327 04/05/19 2016 04/06/19 0613  BP: 115/81 108/80 (!) 148/98 128/84  Pulse: (!) 108 92 (!) 106 (!) 109  Resp: 20 20 20    Temp: 98.6 F (37 C) 98.4 F (36.9 C) 98.2 F (36.8 C)   TempSrc: Oral Oral Oral Oral  SpO2: 100% 100% 96% 99%  Weight:      Height:        Intake/Output Summary (Last 24 hours) at 04/06/2019 1209 Last data filed at 04/06/2019 1020 Gross per 24 hour  Intake 240 ml  Output 1000 ml  Net -760 ml   Filed Weights   04/01/19 0500 04/02/19 0419 04/03/19 0528  Weight: 117.9 kg 117 kg 113 kg    Examination: General exam: Appears calm and comfortable although moderately confused Respiratory system: Clear to auscultation. Respiratory effort normal. Cardiovascular system: S1 & S2 heard, RRR. No JVD, murmurs, rubs, gallops or clicks. No pedal edema. Gastrointestinal system: Abdomen is nondistended, soft and nontender. No organomegaly or masses felt. Normal bowel sounds heard. Central nervous system: Alert and oriented. No focal neurological deficits. 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     Data Reviewed: I have personally reviewed following labs and imaging studies  CBC: Recent Labs  Lab 03/31/19 1440 04/01/19 0536 04/04/19 0502 04/05/19 0826 04/06/19 0550 04/06/19 1041  WBC 3.0* 2.9* 2.2* 1.0* 1.7*  --   NEUTROABS 1.6*  --  1.5* 0.2* 1.3*  --   HGB 10.0* 8.5* 9.2* 8.9* 9.0*  --   HCT 30.5* 26.0* 27.8* 27.0* 27.8*  --   MCV 86.4 85.2 85.3 85.2 86.1  --   PLT 206 165 107* 81* PLATELET CLUMPS NOTED ON SMEAR, COUNT APPEARS ADEQUATE 60*   Basic Metabolic Panel: Recent Labs  Lab 03/31/19 1440  04/01/19 0536 04/02/19 0428 04/05/19 0826 04/06/19 0550  NA 139 140  --  144 148*  K 3.3* 3.2*  --  3.7 3.8  CL 99 104  --  109 110  CO2 28 28  --  28 29  GLUCOSE 103* 94  --  105* 99  BUN 46* 39*  --  24* 23  CREATININE 3.12* 2.12* 1.38* 0.91 0.94  CALCIUM 8.9 8.4*  --  8.5* 8.9   GFR: Estimated Creatinine Clearance: 85.5 mL/min (by C-G formula based on SCr of 0.94 mg/dL). Liver Function Tests: Recent Labs  Lab 04/01/19 0536  AST 37  ALT 29  ALKPHOS 66  BILITOT 0.7  PROT 5.4*  ALBUMIN 2.8*   No results for input(s): LIPASE, AMYLASE in the last 168 hours. No results for input(s): AMMONIA in the last 168 hours. Coagulation Profile: No results for input(s): INR, PROTIME in the last 168 hours. Cardiac Enzymes: No results for input(s): CKTOTAL, CKMB, CKMBINDEX, TROPONINI in the last 168 hours. BNP (last 3 results) No results for input(s): PROBNP in the last 8760 hours. HbA1C: No results for input(s): HGBA1C in the last 72 hours. CBG: No results for input(s): GLUCAP in the last 168 hours. Lipid Profile: No results for input(s): CHOL, HDL, LDLCALC, TRIG, CHOLHDL, LDLDIRECT in the last 72 hours. Thyroid Function Tests: Recent Labs    04/06/19 0550  TSH 3.279   Anemia Panel: Recent Labs    04/05/19 1220 04/06/19 0550  VITAMINB12  --  606  FOLATE  --  3.4*  RETICCTPCT <0.4*  --    Sepsis Labs: Recent Labs  Lab 03/31/19 1440  LATICACIDVEN 0.9    Recent Results (from the past 240 hour(s))  Culture, blood (routine x 2)     Status: None (Preliminary result)   Collection Time: 03/31/19  2:40 PM   Specimen: BLOOD  Result Value Ref Range Status   Specimen Description BLOOD LEFT ANTECUBITAL  Final   Special Requests   Final    BOTTLES DRAWN AEROBIC AND ANAEROBIC Blood Culture adequate volume   Culture   Final    NO GROWTH 4 DAYS Performed at [facility], [address], [place], [place] [address]    Report Status PENDING  Incomplete  Culture,  blood (routine x 2)     Status: None (Preliminary result)   Collection Time: 03/31/19  2:40 PM   Specimen: BLOOD  Result Value Ref Range Status   Specimen Description BLOOD BLOOD LEFT HAND  Final   Special Requests   Final    BOTTLES DRAWN AEROBIC AND ANAEROBIC Blood Culture adequate volume   Culture   Final    NO GROWTH 4 DAYS Performed at [facility], [address] [place], [place] [address]    Report Status PENDING  Incomplete  SARS CORONAVIRUS 2 (TAT 6-24 HRS) Nasopharyngeal Nasopharyngeal Swab     Status: None   Collection Time: 03/31/19  2:51 PM   Specimen: Nasopharyngeal Swab  Result Value Ref Range Status   SARS Coronavirus 2 NEGATIVE NEGATIVE Final    Comment: (NOTE) SARS-CoV-2 target nucleic acids are NOT DETECTED. The SARS-CoV-2 RNA is generally detectable in upper and lower respiratory specimens during the acute phase of infection. Negative results do not preclude SARS-CoV-2 infection, do not rule out co-infections with other pathogens, and should not be used as the sole basis for treatment or other patient management decisions. Negative results must be combined with clinical observations, patient history, and epidemiological information. The expected result is Negative. Fact Sheet for Patients: SugarRoll.be Fact Sheet for Healthcare Providers: https://www.woods-mathews.com/ This test is not yet approved or cleared by the [place] FDA and  has been authorized for detection and/or diagnosis of SARS-CoV-2 by FDA under an Emergency Use Authorization (EUA). This EUA will remain  in effect (meaning this test can be used) for the duration of the COVID-19 declaration under Section 56 4(b)(1) of the Act, 21 U.S.C. section 360bbb-3(b)(1), unless the authorization is terminated or revoked sooner. Performed at [facility], [place] [address] [place], [place] [address]   Culture, blood (x 2)     Status: None  (Preliminary result)   Collection Time: 04/05/19 11:17 AM   Specimen: BLOOD  Result Value Ref Range Status   Specimen Description BLOOD LEFT ANTECUBITAL  Final   Special Requests   Final    BOTTLES DRAWN AEROBIC AND ANAEROBIC Blood Culture adequate volume   Culture   Final    NO GROWTH < 24 HOURS Performed at [facility], [address] [place], [place] [address]    Report Status PENDING  Incomplete  Culture, blood (x 2)     Status: None (Preliminary result)   Collection Time: 04/05/19 11:25 AM   Specimen: BLOOD  Result Value Ref Range Status   Specimen Description BLOOD RIGHT ANTECUBITAL  Final   Special Requests  Final    BOTTLES DRAWN AEROBIC AND ANAEROBIC Blood Culture adequate volume   Culture   Final    NO GROWTH < 24 HOURS Performed at [facility], [address] [place], [place] [address]    Report Status PENDING  Incomplete         Radiology Studies: No results found.      Scheduled Meds: . aspirin  81 mg Oral Daily  . Chlorhexidine Gluconate Cloth  6 each Topical Daily  . doxycycline  100 mg Oral Q12H  . enoxaparin (LOVENOX) injection  40 mg Subcutaneous Q24H  . gabapentin  300 mg Oral QID  . lamoTRIgine  200 mg Oral BID  . lamoTRIgine  25 mg Oral BID  . mouth rinse  15 mL Mouth Rinse BID  . pantoprazole  40 mg Oral Daily  . sodium chloride flush  3 mL Intravenous Q12H  . traZODone  50 mg Oral QHS   Continuous Infusions: . sodium chloride Stopped (04/02/19 1102)     LOS: 5 days    Time spent: 75 min    Nicolette Bang, MD [facility]  If 7PM-7AM, please contact night-coverage  04/06/2019, 12:09 PM

## 2019-04-07 DIAGNOSIS — E538 Deficiency of other specified B group vitamins: Secondary | ICD-10-CM

## 2019-04-07 DIAGNOSIS — L89150 Pressure ulcer of sacral region, unstageable: Secondary | ICD-10-CM

## 2019-04-07 LAB — CBC WITH DIFFERENTIAL/PLATELET
Abs Immature Granulocytes: 0.02 10*3/uL (ref 0.00–0.07)
Basophils Absolute: 0 10*3/uL (ref 0.0–0.1)
Basophils Relative: 0 %
Eosinophils Absolute: 0.2 10*3/uL (ref 0.0–0.5)
Eosinophils Relative: 9 %
HCT: 27.6 % — ABNORMAL LOW (ref 39.0–52.0)
Hemoglobin: 8.6 g/dL — ABNORMAL LOW (ref 13.0–17.0)
Immature Granulocytes: 1 %
Lymphocytes Relative: 32 %
Lymphs Abs: 0.7 10*3/uL (ref 0.7–4.0)
MCH: 28.1 pg (ref 26.0–34.0)
MCHC: 31.2 g/dL (ref 30.0–36.0)
MCV: 90.2 fL (ref 80.0–100.0)
Monocytes Absolute: 0 10*3/uL — ABNORMAL LOW (ref 0.1–1.0)
Monocytes Relative: 1 %
Neutro Abs: 1.4 10*3/uL — ABNORMAL LOW (ref 1.7–7.7)
Neutrophils Relative %: 57 %
Platelets: 55 10*3/uL — ABNORMAL LOW (ref 150–400)
RBC: 3.06 MIL/uL — ABNORMAL LOW (ref 4.22–5.81)
RDW: 17.2 % — ABNORMAL HIGH (ref 11.5–15.5)
WBC: 2.3 10*3/uL — ABNORMAL LOW (ref 4.0–10.5)
nRBC: 0 % (ref 0.0–0.2)

## 2019-04-07 NOTE — Care Management Important Message (Signed)
Important Message  Patient Details  Name: Jeremiah [name]. MRN: [id number] Date of Birth: [birth year]   Medicare Important Message Given:  Yes     [name] 04/07/2019, 8:08 AM

## 2019-04-07 NOTE — Progress Notes (Signed)
Asked Dr. Wyonia Hough for a low air loss mattress. Orders placed.

## 2019-04-07 NOTE — Progress Notes (Signed)
Physical Therapy Treatment Patient Details Name: Jeremiah [name]. MRN: [id number] DOB: 1940-11-21 Today's Date: 04/07/2019    History of Present Illness  78 y.o. male with Past medical history of rheumatoid arthritis on immunosuppressive therapy, seizures, HTN, chronic lymphedema.  Went to [facility] last week with cellulitis and got antibiotics, however LE swelling had continued along with increased difficulty with mobility and strength.    PT Comments    The patient was in bed upon entering room, CNA finishing up turning pt. Pt complained of significant LE pain as well as scrotal pain due to swelling. The patient participated in bed level therapeutic exercises, AROM for SAQ and ankle pumps, AAROM for remaining exercises. The patient multiple times with LE movement yelled out/grimaced in pain. Further mobility held due to pain, though PT and pt spent time discussing importance of mobility, continued LE strengthening, and importance of repositioning. The patient would benefit from further skilled PT intervention to maximize mobility, safety, and independence.     Follow Up Recommendations  SNF     Equipment Recommendations  None recommended by PT    Recommendations for Other Services       Precautions / Restrictions Precautions Precautions: Fall Restrictions Weight Bearing Restrictions: No    Mobility  Bed Mobility               General bed mobility comments: deferred due to pain  Transfers                 General transfer comment: Pt deferred further mobility at this time due to significant pain  Ambulation/Gait                 Stairs             Wheelchair Mobility    Modified Rankin (Stroke Patients Only)       Balance                                            Cognition Arousal/Alertness: Awake/alert Behavior During Therapy: WFL for tasks assessed/performed Overall Cognitive Status: Within Functional Limits for  tasks assessed                                        Exercises Other Exercises Other Exercises: supine AAROM heel slides, hip abduction, AROM SAQ ankle pumps x10 ea leg for each exercise Other Exercises: supine resisted elbow flexion/extension x10 bilaterally    General Comments        Pertinent Vitals/Pain Pain Assessment: Faces Faces Pain Scale: Hurts whole lot Pain Location: BLE general pain Pain Descriptors / Indicators: Sore;Aching;Crying;Grimacing;Moaning Pain Intervention(s): Limited activity within patient's tolerance;Monitored during session;Repositioned    Home Living                      Prior Function            PT Goals (current goals can now be found in the care plan section) Progress towards PT goals: Not progressing toward goals - comment(limited by pain)    Frequency    Min 2X/week      PT Plan Current plan remains appropriate    Co-evaluation              AM-PAC PT "6 Clicks" Mobility  Outcome Measure  Help needed turning from your back to your side while in a flat bed without using bedrails?: Total Help needed moving from lying on your back to sitting on the side of a flat bed without using bedrails?: Total Help needed moving to and from a bed to a chair (including a wheelchair)?: Total Help needed standing up from a chair using your arms (e.g., wheelchair or bedside chair)?: Total Help needed to walk in hospital room?: Total Help needed climbing 3-5 steps with a railing? : Total 6 Click Score: 6    End of Session   Activity Tolerance: Patient limited by pain Patient left: with bed alarm set;with call bell/phone within reach;in bed Nurse Communication: Mobility status PT Visit Diagnosis: Muscle weakness (generalized) (M62.81);Unsteadiness on feet (R26.81)     Time: [id number] PT Time Calculation (min) (ACUTE ONLY): 19 min  Charges:  $Therapeutic Exercise: 8-22 mins                     Lieutenant Diego  PT, DPT 11:43 AM,04/07/19 [phone]

## 2019-04-07 NOTE — Progress Notes (Signed)
[facility] Hematology/Oncology Progress Note  Date of admission: 03/31/2019  Hospital day:  04/07/2019  Chief Complaint: Jeremiah Stelling. is a 78 y.o. male with rheumatoid arthritis and venous stasis/chronic lymphedema who was admitted through the ER with bilateral lower extremity cellultis.  Subjective:  Patient complains of back pain, groin discomfort, lower extremity swelling, and mouth soreness.  Social History: The patient is alone today.  Allergies:  Allergies  Allergen Reactions  . Cefoperazone Anaphylaxis  . Penicillins Anaphylaxis and Other (See Comments)    Has patient had a PCN reaction causing immediate rash, facial/tongue/throat swelling, SOB or lightheadedness with hypotension: Yes Has patient had a PCN reaction causing severe rash involving mucus membranes or skin necrosis: No Has patient had a PCN reaction that required hospitalization No Has patient had a PCN reaction occurring within the last 10 years: No If all of the above answers are "NO", then may proceed with Cephalosporin use.  . Latex Rash    Scheduled Medications: . aspirin  81 mg Oral Daily  . Chlorhexidine Gluconate Cloth  6 each Topical Daily  . doxycycline  100 mg Oral Q12H  . gabapentin  300 mg Oral QID  . lamoTRIgine  200 mg Oral BID  . lamoTRIgine  25 mg Oral BID  . mouth rinse  15 mL Mouth Rinse BID  . metoprolol tartrate  12.5 mg Oral BID  . pantoprazole  40 mg Oral Daily  . sodium chloride flush  3 mL Intravenous Q12H  . traZODone  50 mg Oral QHS    Review of Systems: GENERAL:  Ongoing fatigue.  Tmax 99.3 in past 24 hours.  No sweats. PERFORMANCE STATUS (ECOG):  2 HEENT:  Lips bleeding.  Mouth remains tender.  No visual changes, runny nose. Lungs: No shortness of breath or cough.  No hemoptysis. Cardiac:  No chest pain, palpitations, orthopnea, or PND. GI:  Abdominal discomfort.  No nausea, vomiting, diarrhea, constipation, melena or hematochezia. GU:  Catheter  in place.  No urgency, frequency, dysuria, or hematuria. Musculoskeletal:  Back pain.  No joint pain.  No muscle tenderness. Extremities:  Lower extremity swelling. Skin:  Leg, scrotum, and buttock/sacrum breakdown. Neuro:  History of intermittent confusion.  No headache, numbness or weakness. Endocrine:  No diabetes, thyroid issues, hot flashes or night sweats. Psych:  No mood changes, depression or anxiety. Pain:  Pain associated with mouth, back, groin and legs. Review of systems:  All other systems reviewed and found to be negative.  Physical Exam: Blood pressure 106/62, pulse 99, temperature 99 F (37.2 C), temperature source Oral, resp. rate 20, height 6' (1.829 m), weight 248 lb 14.4 oz (112.9 kg), SpO2 100 %.  GENERAL:  Well developed, well nourished, elderly gentleman sitting comfortably on the medical unit in no acute distress. MENTAL STATUS:  Alert and oriented to person. HEAD:  Near alopecia.  Normocephalic, atraumatic, face symmetric, no Cushingoid features. EYES:  Blue eyes.  Pupils equal round and reactive to light and accomodation.  No conjunctivitis or scleral icterus. ENT:  Lips with dried blood.  Stomatitis/mucositis under tongue and cheeks.  No thrush.  RESPIRATORY:  Clear to auscultation anteriorly without rales, wheezes or rhonchi. CARDIOVASCULAR:  Regular rate and rhythm without murmur, rub or gallop. ABDOMEN:  Soft, slightly tender on palpation without guarding or rebound tenderness.  Active bowel sounds and no hepatosplenomegaly.  No masses. SKIN:  Bilateral lower extremity edema, erythema, and dequamation.  Right lower extremity erythema appears to be extending above line  medially.  Scrotum moist.  Sacrum not visualized. EXTREMITIES: Bilateral lower extremity 3+ edema.  No palpable cords. NEUROLOGICAL: Engaging.  Answers questions appropriately.  Moves all 4 extremities. PSYCH:  Appropriate.   Results for orders placed or performed during the hospital encounter of  03/31/19 (from the past 48 hour(s))  CBC with Differential/Platelet     Status: Abnormal   Collection Time: 04/06/19  5:50 AM  Result Value Ref Range   WBC 1.7 (L) 4.0 - 10.5 K/uL   RBC 3.23 (L) 4.22 - 5.81 MIL/uL   Hemoglobin 9.0 (L) 13.0 - 17.0 g/dL   HCT 27.8 (L) 39.0 - 52.0 %   MCV 86.1 80.0 - 100.0 fL   MCH 27.9 26.0 - 34.0 pg   MCHC 32.4 30.0 - 36.0 g/dL   RDW 17.2 (H) 11.5 - 15.5 %   Platelets  150 - 400 K/uL    PLATELET CLUMPS NOTED ON SMEAR, COUNT APPEARS ADEQUATE    Comment: PLATELET CLUMPS NOTED ON SMEAR, UNABLE TO ESTIMATE Immature Platelet Fraction may be clinically indicated, consider ordering this additional test [id number]    nRBC 0.0 0.0 - 0.2 %   Neutrophils Relative % 78 %   Neutro Abs 1.3 (L) 1.7 - 7.7 K/uL   Lymphocytes Relative 13 %   Lymphs Abs 0.2 (L) 0.7 - 4.0 K/uL   Monocytes Relative 5 %   Monocytes Absolute 0.1 0.1 - 1.0 K/uL   Eosinophils Relative 3 %   Eosinophils Absolute 0.1 0.0 - 0.5 K/uL   Basophils Relative 1 %   Basophils Absolute 0.0 0.0 - 0.1 K/uL   WBC Morphology MORPHOLOGY UNREMARKABLE    RBC Morphology MIXED RBC POPULATION    Smear Review PLATELET CLUMPS NOTED ON SMEAR, UNABLE TO ESTIMATE    Abs Immature Granulocytes 0.00 0.00 - 0.07 K/uL    Comment: Performed at [facility], [address] [place], [place] [id number]  Basic metabolic panel     Status: Abnormal   Collection Time: 04/06/19  5:50 AM  Result Value Ref Range   Sodium 148 (H) 135 - 145 mmol/L   Potassium 3.8 3.5 - 5.1 mmol/L   Chloride 110 98 - 111 mmol/L   CO2 29 22 - 32 mmol/L   Glucose, Bld 99 70 - 99 mg/dL   BUN 23 8 - 23 mg/dL   Creatinine, Ser 0.94 0.61 - 1.24 mg/dL   Calcium 8.9 8.9 - 10.3 mg/dL   GFR calc non Af Amer >60 >60 mL/min   GFR calc Af Amer >60 >60 mL/min   Anion gap 9 5 - 15    Comment: Performed at [facility], [address] [place], [place] [address]  Vitamin B12     Status: None   Collection Time: 04/06/19  5:50 AM   Result Value Ref Range   Vitamin B-12 606 180 - 914 pg/mL    Comment: (NOTE) This assay is not validated for testing neonatal or myeloproliferative syndrome specimens for Vitamin B12 levels. Performed at [facility], [address] [place], [place] [address]   Folate     Status: Abnormal   Collection Time: 04/06/19  5:50 AM  Result Value Ref Range   Folate 3.4 (L) >5.9 ng/mL    Comment: Performed at [facility], [address] [place], [place] [address]  TSH     Status: None   Collection Time: 04/06/19  5:50 AM  Result Value Ref Range   TSH 3.279 0.350 -  4.500 uIU/mL    Comment: Performed by a 3rd Generation assay with a functional sensitivity of <=0.01 uIU/mL. Performed at [facility], [address] [place], [place] [address]   DAT, polyspecific, [facility] only)     Status: None   Collection Time: 04/06/19  5:50 AM  Result Value Ref Range   Polyspecific [facility] test      NEG Performed at [facility], [address] [place], [place] [address]   Platelet count     Status: Abnormal   Collection Time: 04/06/19 10:41 AM  Result Value Ref Range   Platelets 60 (L) 150 - 400 K/uL    Comment: Immature Platelet Fraction may be clinically indicated, consider ordering this additional test [id number] Performed at [facility], [address] [place], [place] [address]   Urinalysis, Complete w Microscopic     Status: Abnormal   Collection Time: 04/06/19  6:34 PM  Result Value Ref Range   Color, Urine BROWN (A) YELLOW   APPearance CLOUDY (A) CLEAR   Specific Gravity, Urine 1.025 1.005 - 1.030   pH  5.0 - 8.0    TEST NOT REPORTED DUE TO COLOR INTERFERENCE OF URINE PIGMENT   Glucose, UA (A) NEGATIVE mg/dL    TEST NOT REPORTED DUE TO COLOR INTERFERENCE OF URINE PIGMENT   Hgb urine dipstick (A) NEGATIVE    TEST NOT REPORTED DUE TO COLOR INTERFERENCE OF URINE PIGMENT   Bilirubin Urine (A) NEGATIVE    TEST NOT REPORTED DUE TO COLOR  INTERFERENCE OF URINE PIGMENT   Ketones, ur (A) NEGATIVE mg/dL    TEST NOT REPORTED DUE TO COLOR INTERFERENCE OF URINE PIGMENT   Protein, ur (A) NEGATIVE mg/dL    TEST NOT REPORTED DUE TO COLOR INTERFERENCE OF URINE PIGMENT   Nitrite (A) NEGATIVE    TEST NOT REPORTED DUE TO COLOR INTERFERENCE OF URINE PIGMENT   Leukocytes,Ua (A) NEGATIVE    TEST NOT REPORTED DUE TO COLOR INTERFERENCE OF URINE PIGMENT    Comment: Performed at [facility], [address] [place], [place] [address]  Na and K (sodium & potassium), rand urine     Status: None   Collection Time: 04/06/19  6:34 PM  Result Value Ref Range   Sodium, Ur 45 mmol/L   Potassium Urine 43 mmol/L    Comment: Performed at [facility], [address] [place], [place] [address]  Creatinine, urine, random     Status: None   Collection Time: 04/06/19  6:34 PM  Result Value Ref Range   Creatinine, Urine 143 mg/dL    Comment: Performed at [facility], [address] [place], [place] [address]  Osmolality, urine     Status: None   Collection Time: 04/06/19  6:34 PM  Result Value Ref Range   Osmolality, Ur 742 300 - 900 mOsm/kg    Comment: Performed at [facility], [address] [place], [place] [address]  CBC with Differential     Status: Abnormal   Collection Time: 04/07/19  4:37 AM  Result Value Ref Range   WBC 2.3 (L) 4.0 - 10.5 K/uL   RBC 3.06 (L) 4.22 - 5.81 MIL/uL   Hemoglobin 8.6 (L) 13.0 - 17.0 g/dL   HCT 27.6 (L) 39.0 - 52.0 %   MCV 90.2 80.0 - 100.0 fL   MCH 28.1 26.0 - 34.0 pg   MCHC 31.2 30.0 - 36.0 g/dL   RDW 17.2 (H) 11.5 - 15.5 %  Platelets 55 (L) 150 - 400 K/uL    Comment: Immature Platelet Fraction may be clinically indicated, consider ordering this additional test [id number] CONSISTENT WITH PREVIOUS RESULT    nRBC 0.0 0.0 - 0.2 %   Neutrophils Relative % 57 %   Neutro Abs 1.4 (L) 1.7 - 7.7 K/uL   Lymphocytes Relative 32 %   Lymphs Abs 0.7 0.7 - 4.0 K/uL    Monocytes Relative 1 %   Monocytes Absolute 0.0 (L) 0.1 - 1.0 K/uL   Eosinophils Relative 9 %   Eosinophils Absolute 0.2 0.0 - 0.5 K/uL   Basophils Relative 0 %   Basophils Absolute 0.0 0.0 - 0.1 K/uL   Immature Granulocytes 1 %   Abs Immature Granulocytes 0.02 0.00 - 0.07 K/uL    Comment: Performed at [facility], [address] [place], [place] [address]   No results found.  CBCs: 03/26/2019: Hematocrit 33.4.  Hemoglobin 10.7.  MCV 89.0.  Platelets 128,000, WBC 3700. 03/31/2019: Hematocrit 30.5.  Hemoglobin 10.0.  MCV 86.4.  Platelets 206,000.  WBC 3000 (ANC 1600). 04/01/2019: Hematocrit 26.0.  Hemoglobin 8.5.  MCV 85.2.  Platelets 165,000.  WBC 2900.  04/04/2019: Hematocrit 27.8.  Hemoglobin 9.2.  MCV 85.3.  Platelets 107,000.  WBC 2200 (ANC1500).  04/05/2019: Hematocrit 27.0.  Hemoglobin 8.9.  MCV 85.2.  Platelets 81,000.  WBC 1000 (200).       04/06/2019: Hematocrit 27.8.  Hemoglobin 9.0.  MCV 86.1.  Platelets 60,000.  WBC 1700 (1300).  04/07/2019: Hematocrit 27.6.  Hemoglobin 8.6.  MCV 90.2.  Platelets 55,000.  WBC 2300 (1400).   Assessment:  Jeremiah [name]. is a 78 y.o. male with rheumatoid arthritis who was admitted through the ER with bilateral lower extremity cellulitis unresponsive to outpatient management.  Cultures remain negative to date.  He was initially treated in the outpatient department with ciprofloxacin and clindamycin.  On admission he was switched to vancomycin and then doxycycline 04/03/2019.  He was rheumatoid arthritis and receives oral MTX weekly, Enbrel SQ weekly on Fridays, and Plaquenil.  Creatinine was 3.12 on admission and is currently 0.94.  Peripheral smear reveals absolute leukopenia, normochromic, normocytic anemia with mild anisocytosis.  There is thrombocytopenia. There were no blasts or schistocytes.  There were scattered target cells and teardrop cells..  He has folic acid deficiency.  Folic acid was 3.4 on [id number].  Per  patient's report, he stopped folic acid about 1 month ago.  Symptomatically, he remains fatigued.  He has ongoing mucositis/stomatitis felt secondary to MTX.  He has ongoing cellulitis.  Exam reveals concern for extension of right leg erythema.  He has known sacral breakdown.  Scrotum is moist.  Plan:   1.   Pancytopenia  Anemia   Hemoglobin slowly drifting down.   Hemoglobin 8.6 today.     Retic count was < 0.4% (low) on 04/05/2019.   Minor bleeding from lips.   No evidence of hemolysis.  Coombs negative.  LDH slightly elevated.   B12 was 606.  Folate was 3.4 (low).  TSH was 3.279.    Begin folic acid 1 mg a day.    Patient previously on folic acid to decrease toxicity of antimetabolite therapy (MTX).     He states that he was told by a doctor at [facility] about 1 month ago to stop folic acid.    Folic acid deficiency can also affect other cell lines.   Thrombocytopenia   Platelet count drifting down to 55,000, but  rate of decline has decreased.   Patient off Lovenox.   Doubt HIT.  Serotonion release assay and HIT study pending.  Leukopenia   WBC is recovering   WBC 1000 to 1700 to 2300 with an ANC 200 to 1300 to 1400.  Etiology felt multi-factorial, but likely due to methotrexate (MTX).             Suspect poor marrow reserve given baseline counts with some component of myelosuppression due to infection.              He was taking Plaquenil, MTX, and Enbrel until his admission on 03/31/2019.                         Each medication associated to some degree with myelosuppression.                         He was taking Plaquenil daily, MTX weekly on Mondays, and Enbrel weekly on Fridays.                                     MTX thus given on 03/30/2019 and Enbrel on 03/27/2019.             Patient noted to have creatinine of 3.12 on 03/31/2019.             Suspect MTX toxicity from elevated creatinine (decreased CrCl) and no folic acid support.             Patient has resultant  stomatitis from MTX.                         Recommend oral rinses and lip care.             Contacted lab.  Unclear why STAT MTX level is unavailable.             Previous antibiotics (ciprofloxacin, clindamycin, and vancomycin) have low risk of myelosuppression.                         Vancomycin 2-12% incidence of neutropenia and pancytopenia < 1%.             Monitor CBC with diff daily.             Discuss possible bone marrow aspirate/biopsy with patient if counts do not recover.     Consider growth factor support if needed per ID.  Continue neutropenic precautions.         2.   Bilateral lower extremity cellulitis  Clinically erythema in right leg seems to have extended medially.  Patient on doxycycline since 04/03/2019.  Consult infectious disease- contacted. 3.   Disposition  Patient remains hospitalized.    Lequita Asal, MD  04/07/2019, 6:25 PM

## 2019-04-07 NOTE — Consult Note (Signed)
WOC contacted by bedside nursing about skin tear on the patient's scrotum and an area on the right inner thigh.  Also about moisture in the groin I saw this patient yesterday and I was unable to see the scrotal wound or the inner thigh. I was turning the patient alone and was not able to position patient well enough. The bedside nurse yesterday was not aware of any other wounds.   Discussed areas of concern with today's bedside nurse.  Added orders for scrotal wound based on our collaboration and for the ITD with antimicrobial wicking fabric for the groin.  Foam dressing to the right inner thigh wound and if this is not able to adhere to this area switch to moisture barrier cream.   WOC will re-evaluate areas tomorrow when [place] back on the [facility] and will ask for assistance with turning patient   [place], [place], [place]

## 2019-04-07 NOTE — Progress Notes (Signed)
BP 101/57 and HR 96. Notified Dr. Wyonia Hough, ordered to hold metoprolol.

## 2019-04-07 NOTE — Progress Notes (Signed)
Patient had an episode where he was grunting very loudly and not responding to voice. He then stopped and started again. I checked vitals signs BP 106/65 HR 103 Temp 99.2. He was also very tired. Notified Dr. Wyonia Hough about this and that I had given Oxycodone. Dr. Wyonia Hough ordered to hold the oxycodone until patient is more alert.

## 2019-04-07 NOTE — Progress Notes (Signed)
PROGRESS NOTE    [name].  [id number] DOB: Oct 06, 1940 DOA: 03/31/2019 PCP: Derinda Late, MD   Brief Narrative:  Patient is a 78 year old white male with a past no history of rheumatoid arthritis on immunosuppressive therapy, seizures, hypertension, chronic lymphedema who presented with worsening leg infection. Patient was seen at the ER at [facility] treated with outpatient medications but because of worsening clinical presentation he came to the ER and was admitted for further eval and treatment   Assessment & Plan:   Principal Problem:   Cellulitis Active Problems:   Rheumatoid arthritis ([place])   Lymphedema   Pressure injury of skin   Other pancytopenia ([place])   Cellulitis superimposed on venous stasis changes associated lymphedema. No change,Narrowedantibiotics. Follow-up culturesneg, CRP elevated as expected, monitor renal function closely which has improved from admission creatinine 3.12 to0.9  Delirium:Patient with confusion overnight did receive Ativan, at baseline now conversing normally this a.m. although family reports he was confused yesterday, avoid anxiolytics if possible, avoid antihistamines, frequent day and night orienting.  New pancytopenia with neutropenia: Unclear etiology. ? mtx toxicity white blood cell count3>2.9>2.2>1>1.7>2.3(5.18 Feb 2018)added neutropenic precautions 11/15, requested consultation with hematology oncology who thought probable MTX toxicity-will follow their recs Platelets continue to decline, also hgb marginal 8.6 F/up further recs with hem onc Will hold d/c 2/2 concerns for possible underlying marrow process  Recheck cbc in AM  Acute kidney injury: Suspect was likely prerenal in nature,patient received IV albumin, improving,improved0.9  Rheumatoid arthritis:continueholding immunosuppressants currently, heme-onc as noted concerned about methotrexate toxicity  Neuropathy: Continue gabapentin   Seizure  disorder: ContinueLamictalno evidence of breakthrough seizures  Pressure ulcers present on admission. These were bilateral buttock as well as mid sacrum: Wound care was consultedappreciate their recommendations. Continue with silver Hydrofiber, and foam changing every 3 days and as needed for soilage  DVT prophylaxis: None:pr refusing SCD's, platelets continue to drop-holding pharmacological  Code Status: Full    Code Status Orders  (From admission, onward)         Start     Ordered   03/31/19 1819  Full code  Continuous     03/31/19 1819        Code Status History    Date Active Date Inactive Code Status Order ID Comments User Context   02/20/2018 2220 02/24/2018 2019 Full Code [id number]  Lance Coon, MD ED   05/23/2015 2221 05/26/2015 1741 Full Code [id number]  Hower, Aaron Mose, MD ED   Advance Care Planning Activity     Family Communication: Left message with identified contact daughter-in-law Alyse Low at [phone] Disposition Plan:   Patient remained inpatient for continued treatment with IV antibiotics, continued monitoring and evaluation by subspecialty for pancytopenia, treatment of delirium.  Patient not yet medically stable for discharge Consults called: None Admission status: Inpatient   Consultants:   Hematology oncology  Procedures:  US Renal  Result Date: 04/01/2019 CLINICAL DATA:  Acute kidney injury EXAM: RENAL / URINARY TRACT ULTRASOUND COMPLETE COMPARISON:  None. FINDINGS: Right Kidney: Renal measurements: 11.1 x 4.3 x 4.5 cm = volume: 110 mL . Echogenicity within normal limits. No mass or hydronephrosis visualized. Left Kidney: Renal measurements: 11.3 x 5.6 x 4.7 cm = volume: 155 mL. Echogenicity within normal limits. There is a thin walled anechoic simple cyst at the mid to inferior pole the left kidney measuring 2.1 x 1.6 x 2.2 cm. No solid mass or hydronephrosis visualized. Bladder: Decompressed with Foley catheter in place. Other: None. IMPRESSION:  Negative  for obstructive uropathy. Electronically Signed   By: Davina Poke M.D.   On: 04/01/2019 10:11     Antimicrobials:   Vancomycin discontinued on day 3  Doxycycline 100 mg p.o. twice daily day 5    Subjective: No acute events overnight Sitting bed comfortably does not appear to be confused this morning  Objective: Vitals:   04/07/19 0448 04/07/19 0500 04/07/19 1058 04/07/19 1314  BP: 122/81  (!) 101/57 106/62  Pulse: (!) 101  96 99  Resp: 20   20  Temp: 99.3 F (37.4 C)   99 F (37.2 C)  TempSrc: Oral   Oral  SpO2: 99%   100%  Weight:  112.9 kg    Height:        Intake/Output Summary (Last 24 hours) at 04/07/2019 1332 Last data filed at 04/07/2019 1000 Gross per 24 hour  Intake 3 ml  Output 700 ml  Net -697 ml   Filed Weights   04/02/19 0419 04/03/19 0528 04/07/19 0500  Weight: 117 kg 113 kg 112.9 kg    Examination:  General exam:Appears calm and comfortable although moderately confused Respiratory system: Clear to auscultation. Respiratory effort normal. Cardiovascular system:S1 &S2 heard, RRR. No JVD, murmurs, rubs, gallops or clicks. No pedal edema. Gastrointestinal system:Abdomen is nondistended, soft and nontender. No organomegaly or masses felt. Normal bowel sounds heard. Central nervous system:Alert and oriented. No focal neurological deficits. Extremities:Chronic lymphedema, venous stasis with hemosiderin staining Skin:Mild erythema consistent with cellulitis superimposed on lymphedema improving , gluteal partial-thickness skin loss pressure wound Psychiatry:Judgement and insightmoderately impaired consistent with chronic early dementia mood &affect appropriate..      Data Reviewed: I have personally reviewed following labs and imaging studies  CBC: Recent Labs  Lab 03/31/19 1440 04/01/19 0536 04/04/19 0502 04/05/19 0826 04/06/19 0550 04/06/19 1041 04/07/19 0437  WBC 3.0* 2.9* 2.2* 1.0* 1.7*  --  2.3*  NEUTROABS 1.6*  --   1.5* 0.2* 1.3*  --  1.4*  HGB 10.0* 8.5* 9.2* 8.9* 9.0*  --  8.6*  HCT 30.5* 26.0* 27.8* 27.0* 27.8*  --  27.6*  MCV 86.4 85.2 85.3 85.2 86.1  --  90.2  PLT 206 165 107* 81* PLATELET CLUMPS NOTED ON SMEAR, COUNT APPEARS ADEQUATE 60* 55*   Basic Metabolic Panel: Recent Labs  Lab 03/31/19 1440 04/01/19 0536 04/02/19 0428 04/05/19 0826 04/06/19 0550  NA 139 140  --  144 148*  K 3.3* 3.2*  --  3.7 3.8  CL 99 104  --  109 110  CO2 28 28  --  28 29  GLUCOSE 103* 94  --  105* 99  BUN 46* 39*  --  24* 23  CREATININE 3.12* 2.12* 1.38* 0.91 0.94  CALCIUM 8.9 8.4*  --  8.5* 8.9   GFR: Estimated Creatinine Clearance: 85.4 mL/min (by C-G formula based on SCr of 0.94 mg/dL). Liver Function Tests: Recent Labs  Lab 04/01/19 0536  AST 37  ALT 29  ALKPHOS 66  BILITOT 0.7  PROT 5.4*  ALBUMIN 2.8*   No results for input(s): LIPASE, AMYLASE in the last 168 hours. No results for input(s): AMMONIA in the last 168 hours. Coagulation Profile: No results for input(s): INR, PROTIME in the last 168 hours. Cardiac Enzymes: No results for input(s): CKTOTAL, CKMB, CKMBINDEX, TROPONINI in the last 168 hours. BNP (last 3 results) No results for input(s): PROBNP in the last 8760 hours. HbA1C: No results for input(s): HGBA1C in the last 72 hours. CBG: No results for input(s):  GLUCAP in the last 168 hours. Lipid Profile: No results for input(s): CHOL, HDL, LDLCALC, TRIG, CHOLHDL, LDLDIRECT in the last 72 hours. Thyroid Function Tests: Recent Labs    04/06/19 0550  TSH 3.279   Anemia Panel: Recent Labs    04/05/19 1220 04/06/19 0550  VITAMINB12  --  606  FOLATE  --  3.4*  RETICCTPCT <0.4*  --    Sepsis Labs: Recent Labs  Lab 03/31/19 1440  LATICACIDVEN 0.9    Recent Results (from the past 240 hour(s))  Culture, blood (routine x 2)     Status: None (Preliminary result)   Collection Time: 03/31/19  2:40 PM   Specimen: BLOOD  Result Value Ref Range Status   Specimen Description  BLOOD LEFT ANTECUBITAL  Final   Special Requests   Final    BOTTLES DRAWN AEROBIC AND ANAEROBIC Blood Culture adequate volume   Culture   Final    NO GROWTH 4 DAYS Performed at [facility], [address] [place], [place] [address]    Report Status PENDING  Incomplete  Culture, blood (routine x 2)     Status: None (Preliminary result)   Collection Time: 03/31/19  2:40 PM   Specimen: BLOOD  Result Value Ref Range Status   Specimen Description BLOOD BLOOD LEFT HAND  Final   Special Requests   Final    BOTTLES DRAWN AEROBIC AND ANAEROBIC Blood Culture adequate volume   Culture   Final    NO GROWTH 4 DAYS Performed at [facility], [address] [place], [place] [address]    Report Status PENDING  Incomplete  SARS CORONAVIRUS 2 (TAT 6-24 HRS) Nasopharyngeal Nasopharyngeal Swab     Status: None   Collection Time: 03/31/19  2:51 PM   Specimen: Nasopharyngeal Swab  Result Value Ref Range Status   SARS Coronavirus 2 NEGATIVE NEGATIVE Final    Comment: (NOTE) SARS-CoV-2 target nucleic acids are NOT DETECTED. The SARS-CoV-2 RNA is generally detectable in upper and lower respiratory specimens during the acute phase of infection. Negative results do not preclude SARS-CoV-2 infection, do not rule out co-infections with other pathogens, and should not be used as the sole basis for treatment or other patient management decisions. Negative results must be combined with clinical observations, patient history, and epidemiological information. The expected result is Negative. Fact Sheet for Patients: SugarRoll.be Fact Sheet for Healthcare Providers: https://www.woods-mathews.com/ This test is not yet approved or cleared by the [place] FDA and  has been authorized for detection and/or diagnosis of SARS-CoV-2 by FDA under an Emergency Use Authorization (EUA). This EUA will remain  in effect (meaning this test can be used) for  the duration of the COVID-19 declaration under Section 56 4(b)(1) of the Act, 21 U.S.C. section 360bbb-3(b)(1), unless the authorization is terminated or revoked sooner. Performed at [facility], [place] [address], [place], [place] [address]   Culture, blood (x 2)     Status: None (Preliminary result)   Collection Time: 04/05/19 11:17 AM   Specimen: BLOOD  Result Value Ref Range Status   Specimen Description BLOOD LEFT ANTECUBITAL  Final   Special Requests   Final    BOTTLES DRAWN AEROBIC AND ANAEROBIC Blood Culture adequate volume   Culture   Final    NO GROWTH 2 DAYS Performed at [facility], [address], [place], [place] [address]    Report Status PENDING  Incomplete  Culture, blood (x 2)     Status: None (Preliminary  result)   Collection Time: 04/05/19 11:25 AM   Specimen: BLOOD  Result Value Ref Range Status   Specimen Description BLOOD RIGHT ANTECUBITAL  Final   Special Requests   Final    BOTTLES DRAWN AEROBIC AND ANAEROBIC Blood Culture adequate volume   Culture   Final    NO GROWTH 2 DAYS Performed at [facility], [address], [place], [place] [address]    Report Status PENDING  Incomplete         Radiology Studies: No results found.      Scheduled Meds:  aspirin  81 mg Oral Daily   Chlorhexidine Gluconate Cloth  6 each Topical Daily   doxycycline  100 mg Oral Q12H   gabapentin  300 mg Oral QID   lamoTRIgine  200 mg Oral BID   lamoTRIgine  25 mg Oral BID   mouth rinse  15 mL Mouth Rinse BID   metoprolol tartrate  12.5 mg Oral BID   pantoprazole  40 mg Oral Daily   sodium chloride flush  3 mL Intravenous Q12H   traZODone  50 mg Oral QHS   Continuous Infusions:  sodium chloride Stopped (04/02/19 1102)     LOS: 6 days    Time spent: 35 min    Nicolette Bang, MD [facility]  If 7PM-7AM, please contact night-coverage  04/07/2019, 1:32 PM

## 2019-04-07 NOTE — Progress Notes (Signed)
Jonny Ruiz, NP ordered bilateral SCDs; Patient refused d/t painful cellulitis on bilateral LE's; [facility] notified via secure chat of patient's refusal; acknowledged; Order discontinued. Barbaraann Faster, RN 4:31 AM 04/07/2019

## 2019-04-08 ENCOUNTER — Encounter: Payer: Self-pay | Admitting: Urology

## 2019-04-08 DIAGNOSIS — Z881 Allergy status to other antibiotic agents status: Secondary | ICD-10-CM

## 2019-04-08 DIAGNOSIS — Z96641 Presence of right artificial hip joint: Secondary | ICD-10-CM

## 2019-04-08 DIAGNOSIS — Z880 Allergy status to penicillin: Secondary | ICD-10-CM

## 2019-04-08 DIAGNOSIS — N179 Acute kidney failure, unspecified: Secondary | ICD-10-CM | POA: Diagnosis present

## 2019-04-08 DIAGNOSIS — K121 Other forms of stomatitis: Secondary | ICD-10-CM

## 2019-04-08 DIAGNOSIS — Z96651 Presence of right artificial knee joint: Secondary | ICD-10-CM

## 2019-04-08 DIAGNOSIS — L03119 Cellulitis of unspecified part of limb: Secondary | ICD-10-CM | POA: Diagnosis present

## 2019-04-08 DIAGNOSIS — E119 Type 2 diabetes mellitus without complications: Secondary | ICD-10-CM

## 2019-04-08 DIAGNOSIS — D696 Thrombocytopenia, unspecified: Secondary | ICD-10-CM

## 2019-04-08 DIAGNOSIS — E538 Deficiency of other specified B group vitamins: Secondary | ICD-10-CM | POA: Diagnosis present

## 2019-04-08 DIAGNOSIS — Z91040 Latex allergy status: Secondary | ICD-10-CM

## 2019-04-08 DIAGNOSIS — R569 Unspecified convulsions: Secondary | ICD-10-CM

## 2019-04-08 DIAGNOSIS — B372 Candidiasis of skin and nail: Secondary | ICD-10-CM

## 2019-04-08 LAB — CBC WITH DIFFERENTIAL/PLATELET
Abs Immature Granulocytes: 0.04 10*3/uL (ref 0.00–0.07)
Basophils Absolute: 0 10*3/uL (ref 0.0–0.1)
Basophils Relative: 0 %
Eosinophils Absolute: 0.2 10*3/uL (ref 0.0–0.5)
Eosinophils Relative: 15 %
HCT: 29.7 % — ABNORMAL LOW (ref 39.0–52.0)
Hemoglobin: 9.2 g/dL — ABNORMAL LOW (ref 13.0–17.0)
Immature Granulocytes: 3 %
Lymphocytes Relative: 35 %
Lymphs Abs: 0.6 10*3/uL — ABNORMAL LOW (ref 0.7–4.0)
MCH: 28 pg (ref 26.0–34.0)
MCHC: 31 g/dL (ref 30.0–36.0)
MCV: 90.5 fL (ref 80.0–100.0)
Monocytes Absolute: 0 10*3/uL — ABNORMAL LOW (ref 0.1–1.0)
Monocytes Relative: 1 %
Neutro Abs: 0.7 10*3/uL — ABNORMAL LOW (ref 1.7–7.7)
Neutrophils Relative %: 46 %
Platelets: 51 10*3/uL — ABNORMAL LOW (ref 150–400)
RBC: 3.28 MIL/uL — ABNORMAL LOW (ref 4.22–5.81)
RDW: 17.3 % — ABNORMAL HIGH (ref 11.5–15.5)
Smear Review: NORMAL
WBC: 1.6 10*3/uL — ABNORMAL LOW (ref 4.0–10.5)
nRBC: 0 % (ref 0.0–0.2)

## 2019-04-08 LAB — RETICULOCYTES
Immature Retic Fract: 19.5 % — ABNORMAL HIGH (ref 2.3–15.9)
RBC.: 3.28 MIL/uL — ABNORMAL LOW (ref 4.22–5.81)
Retic Count, Absolute: 15.4 10*3/uL — ABNORMAL LOW (ref 19.0–186.0)
Retic Ct Pct: 0.5 % (ref 0.4–3.1)

## 2019-04-08 LAB — HEPARIN INDUCED PLATELET AB (HIT ANTIBODY): Heparin Induced Plt Ab: 0.058 OD (ref 0.000–0.400)

## 2019-04-08 LAB — MISC LABCORP TEST (SEND OUT): Labcorp test code: 7658

## 2019-04-08 MED ORDER — LORAZEPAM 2 MG/ML IJ SOLN
1.0000 mg | Freq: Two times a day (BID) | INTRAMUSCULAR | Status: DC | PRN
  Administered 2019-04-11 – 2019-04-15 (×3): 1 mg via INTRAVENOUS
  Filled 2019-04-08 (×3): qty 1

## 2019-04-08 MED ORDER — POLYETHYLENE GLYCOL 3350 17 G PO PACK
17.0000 g | PACK | Freq: Once | ORAL | Status: DC
  Filled 2019-04-08: qty 1

## 2019-04-08 MED ORDER — FOLIC ACID 1 MG PO TABS
1.0000 mg | ORAL_TABLET | Freq: Every day | ORAL | Status: DC
  Administered 2019-04-08 – 2019-04-19 (×12): 1 mg via ORAL
  Filled 2019-04-08 (×12): qty 1

## 2019-04-08 NOTE — TOC Progression Note (Signed)
Transition of Care (TOC) - Progression Note    Patient Details  Name: Jeremiah [name]. MRN: [id number] Date of Birth: 01-11-1941  Transition of Care [facility]) CM/SW [place], [place] Phone Number: 04/08/2019, 2:09 PM  Clinical Narrative:     Magda Paganini from [facility] notified that patient will not discharge today. Spoke with Claiborne Billings at [facility], per [place] as long as patient discharges by 11/19, they will fall within the 24 hour grace period of authorization. If patient does not discharge by 11/19, then new authorization will need to happen. Magda Paganini was updated at [facility]. Requested for repeat COVID test because patient's covid test was 8 days out and needs to be within 7 days of discharge.   Expected Discharge Plan: [name]    Expected Discharge Plan and Services Expected Discharge Plan: [place]   Discharge Planning Services: CM Consult   Living arrangements for the past 2 months: Single Family Home Expected Discharge Date: 04/02/19                                     Social Determinants of Health (SDOH) Interventions    Readmission Risk Interventions No flowsheet data found.

## 2019-04-08 NOTE — Plan of Care (Signed)
  Problem: Nutrition: Goal: Adequate nutrition will be maintained Outcome: Progressing  Patient ate 90% of breakfast.

## 2019-04-08 NOTE — Consult Note (Signed)
NAME: Jeremiah [name].  DOB: 01-11-1941  MRN: [id number]  Date/Time: 04/08/2019 10:59 AM  REQUESTING PROVIDER Dr. Mike Gip Subjective:  REASON FOR CONSULT: Cellulitis legs ? Jeremiah [name]. is a 78 y.o. male with a history of rheumatoid arthritis, chronic lymphedema, diabetes, hypertension seizures presented to the ED on 03/31/2019 with bilateral leg swelling, redness been present for nearly 2 weeks. Patient had gone to [facility] ED on 03/27/2019 with swelling, erythema and weeping blistering of the bilateral lower extremities.  His vitals there was blood pressure of 128/76, temperature of 97, heart rate of 79 and respiratory rate of 18 and oxygen sats of 100% .  Labs done at that day where WBC of 3.7, hemoglobin of 10.7, platelet of 128.  He had creatinine of 1.4, potassium of 3.4, bilirubin of 0.7, AST of 23 and ALT of 18.  He was sent home on ciprofloxacin 500 mg twice a day for 14 days and clindamycin 300 mg 4 times a day for 14 days along with his other medications which included Enbrel, folic acid, furosemide, gabapentin, Plaquenil, lamotrigine, omeprazole, methotrexate, trazodone, valsartan, vitamin B complex and as needed meloxicam. Patient came to our ED on 03/31/2019 complaining of bilateral leg swelling and redness.  He was given IV vancomycin.  All his rheumatoid medications were put on hold while in the hospital.  As there was no change with this leg the IV vancomycin was changed to p.o. doxycycline on 04/03/2019.  The plan was to DC home on 11/12 but because he was having some trouble with his walking he stated back.  Also has white count and platelet count was dropping and hence hematology was consulted on 04/05/2019 I am asked to see the patient by Dr. Mike Gip for the cellulitis. His blood cultures from 03/31/2019 and 04/05/2019 has been negative. His WBC is 1.6 today, hemoglobin 9.2 and platelet 51.  Creatinine was 3.12 on admission and now it is 0.94.  Vitamin B12 is 606. Folate was  3.4 Patient says he is feeling better Says he has chronic lymphedema and gets fluid blisters sometimes Says his mouth is slightly sore  Past Medical History:  Diagnosis Date  . Allergy   . Arthritis   . Hypertension   . Rheumatoid arthritis ([place])   . Seizures ([place])   . Sepsis ([place]) 05/23/2015    Past Surgical History:  Procedure Laterality Date  . back surgery five times  04/2016  . CARPAL TUNNEL RELEASE    . HERNIA REPAIR    . JOINT REPLACEMENT    . knee replacement right    . right hip replacement    . rotator cuff surgery    . SPINE SURGERY      Social History   Socioeconomic History  . Marital status: Married    Spouse name: Not on file  . Number of children: Not on file  . Years of education: Not on file  . Highest education level: Not on file  Occupational History  . Not on file  Social Needs  . Financial resource strain: Not on file  . Food insecurity    Worry: Not on file    Inability: Not on file  . Transportation needs    Medical: Not on file    Non-medical: Not on file  Tobacco Use  . Smoking status: Former Smoker    Packs/day: 1.00    Years: 20.00    Pack years: 20.00    Types: Cigarettes  . Smokeless tobacco: Never Used  Substance and Sexual Activity  . Alcohol use: No  . Drug use: Not on file  . Sexual activity: Not on file  Lifestyle  . Physical activity    Days per week: Not on file    Minutes per session: Not on file  . Stress: Not on file  Relationships  . Social Herbalist on phone: Not on file    Gets together: Not on file    Attends religious service: Not on file    Active member of club or organization: Not on file    Attends meetings of clubs or organizations: Not on file    Relationship status: Not on file  . Intimate partner violence    Fear of current or ex partner: Not on file    Emotionally abused: Not on file    Physically abused: Not on file    Forced sexual activity: Not on file  Other Topics Concern  .  Not on file  Social History Narrative  . Not on file    Family History  Problem Relation Age of Onset  . Hypertension Other   . Hypertension Mother   . Dementia Mother   . Dementia Father   . Prostate cancer Neg Hx   . Bladder Cancer Neg Hx   . Kidney cancer Neg Hx    Allergies  Allergen Reactions  . Cefoperazone Anaphylaxis  . Penicillins Anaphylaxis and Other (See Comments)    Has patient had a PCN reaction causing immediate rash, facial/tongue/throat swelling, SOB or lightheadedness with hypotension: Yes Has patient had a PCN reaction causing severe rash involving mucus membranes or skin necrosis: No Has patient had a PCN reaction that required hospitalization No Has patient had a PCN reaction occurring within the last 10 years: No If all of the above answers are "NO", then may proceed with Cephalosporin use.  . Latex Rash   ? Current Facility-Administered Medications  Medication Dose Route Frequency Provider Last Rate Last Dose  . 0.9 %  sodium chloride infusion   Intravenous PRN Lavina Hamman, MD   Stopped at 04/02/19 1102  . acetaminophen (TYLENOL) tablet 650 mg  650 mg Oral Q6H PRN Lavina Hamman, MD   650 mg at 04/06/19 [id number]   Or  . acetaminophen (TYLENOL) suppository 650 mg  650 mg Rectal Q6H PRN Lavina Hamman, MD      . aspirin chewable tablet 81 mg  81 mg Oral Daily Lavina Hamman, MD   81 mg at 04/08/19 0830  . Chlorhexidine Gluconate Cloth 2 % PADS 6 each  6 each Topical Daily Lavina Hamman, MD   6 each at 04/07/19 1053  . doxycycline (VIBRA-TABS) tablet 100 mg  100 mg Oral Q12H Spongberg, Audie Pinto, MD   100 mg at 04/08/19 0830  . folic acid (FOLVITE) tablet 1 mg  1 mg Oral Daily Lequita Asal, MD   1 mg at 04/08/19 [id number]  . gabapentin (NEURONTIN) capsule 300 mg  300 mg Oral QID Marcell Anger, MD   300 mg at 04/08/19 [id number]  . lamoTRIgine (LAMICTAL) tablet 200 mg  200 mg Oral BID Lavina Hamman, MD   200 mg at 04/08/19 0831  . lamoTRIgine  (LAMICTAL) tablet 25 mg  25 mg Oral BID Lavina Hamman, MD   25 mg at 04/08/19 0839  . lip balm (BLISTEX) ointment   Topical PRN Spongberg, Audie Pinto, MD      . LORazepam (ATIVAN) injection  1 mg  1 mg Intravenous Q12H PRN Ivor Costa, MD      . MEDLINE mouth rinse  15 mL Mouth Rinse BID Lavina Hamman, MD   15 mL at 04/08/19 0840  . metoprolol tartrate (LOPRESSOR) tablet 12.5 mg  12.5 mg Oral BID Marcell Anger, MD   12.5 mg at 04/08/19 0829  . ondansetron (ZOFRAN) tablet 4 mg  4 mg Oral Q6H PRN Lavina Hamman, MD       Or  . ondansetron [facility]) injection 4 mg  4 mg Intravenous Q6H PRN Lavina Hamman, MD      . oxyCODONE (Oxy IR/ROXICODONE) immediate release tablet 5 mg  5 mg Oral Q6H PRN Lavina Hamman, MD   5 mg at 04/07/19 2051  . pantoprazole (PROTONIX) EC tablet 40 mg  40 mg Oral Daily Lavina Hamman, MD   40 mg at 04/08/19 [id number]  . polyethylene glycol (MIRALAX / GLYCOLAX) packet 17 g  17 g Oral Daily PRN Lavina Hamman, MD      . polyethylene glycol (MIRALAX / GLYCOLAX) packet 17 g  17 g Oral Once Lavina Hamman, MD      . sodium chloride flush (NS) 0.9 % injection 3 mL  3 mL Intravenous Q12H Lavina Hamman, MD   3 mL at 04/08/19 0839  . traZODone (DESYREL) tablet 50 mg  50 mg Oral QHS Lavina Hamman, MD   50 mg at 04/07/19 2047     Abtx:  Anti-infectives (From admission, onward)   Start     Dose/Rate Route Frequency Ordered Stop   04/03/19 1230  doxycycline (VIBRA-TABS) tablet 100 mg     100 mg Oral Every 12 hours 04/03/19 1224     04/02/19 1500  vancomycin (VANCOCIN) 1,250 mg in sodium chloride 0.9 % 250 mL IVPB  Status:  Discontinued     1,250 mg 166.7 mL/hr over 90 Minutes Intravenous Every 48 hours 03/31/19 1902 04/01/19 0844   04/02/19 0800  vancomycin (VANCOCIN) 1,250 mg in sodium chloride 0.9 % 250 mL IVPB  Status:  Discontinued     1,250 mg 166.7 mL/hr over 90 Minutes Intravenous Every 36 hours 04/01/19 0844 04/03/19 1224   04/02/19 0000  minocycline  (MINOCIN) 100 MG capsule     100 mg Oral 2 times daily 04/02/19 1254 04/12/19 2359   03/31/19 1830  vancomycin (VANCOCIN) IVPB 1000 mg/200 mL premix     1,000 mg 200 mL/hr over 60 Minutes Intravenous  Once 03/31/19 1819 03/31/19 2209   03/31/19 1445  vancomycin (VANCOCIN) IVPB 1000 mg/200 mL premix     1,000 mg 200 mL/hr over 60 Minutes Intravenous  Once 03/31/19 1436 03/31/19 1643      REVIEW OF SYSTEMS:  Const: negative fever, negative chills, negative weight loss Eyes: negative diplopia or visual changes, negative eye pain ENT: negative coryza, negative sore throat Sore lips and tongue Resp: negative cough, hemoptysis, dyspnea Cards: negative for chest pain, palpitations, has lower extremity edema GU: negative for frequency, dysuria and hematuria GI: Negative for abdominal pain, diarrhea, bleeding, constipation Skin: negative for rash and pruritus Heme: negative for easy bruising and gum/nose bleeding MS: negative for myalgias, arthralgias, back pain and muscle weakness Neurolo:negative for headaches, dizziness, vertigo, has some memory problems  Psych: negative for feelings of anxiety, depression  Endocrine: negative for thyroid, diabetes Allergy/Immunology-allergic to penicillin.  Causes rash   Objective:  VITALS:  BP 125/70 (BP Location: Left Arm)   Pulse Marland Kitchen)  103   Temp 98.2 F (36.8 C) (Oral)   Resp 20   Ht 6' (1.829 m)   Wt 112.9 kg   SpO2 100%   BMI 33.76 kg/m  PHYSICAL EXAM:  General: Alert, cooperative, no distress, appears stated age.  Pale, hard of hearing, some memory issues Head: Normocephalic, without obvious abnormality, atraumatic. Eyes: Conjunctivae clear, anicteric sclerae. Pupils are equal ENT Nares normal. No drainage or sinus tenderness. Lips, mucosa, some ulceration.  Oral cavity ulceration Neck: Supple, symmetrical, no adenopathy, thyroid: non tender no carotid bruit and no JVD. Back: No CVA tenderness. Lungs: Bilateral air entry Heart:  S1-S2 abdomen: Soft, non-tender,not distended. Bowel sounds normal. No masses Extremities: Right knee scar Has superficial tears in his intergluteal area with some intertrigo Intertrigo over the groin area Skin: Bilateral leg edema Erythema bilaterally over the legs at the site of the edema which is now decreasing.  Skin is peeling from the previous blisters. Lymph: Cervical, supraclavicular normal. Neurologic: Grossly non-focal Pertinent Labs Lab Results CBC    Component Value Date/Time   WBC 1.6 (L) 04/08/2019 0441   RBC 3.28 (L) 04/08/2019 0441   RBC 3.28 (L) 04/08/2019 0441   HGB 9.2 (L) 04/08/2019 0441   HCT 29.7 (L) 04/08/2019 0441   PLT 51 (L) 04/08/2019 0441   MCV 90.5 04/08/2019 0441   MCH 28.0 04/08/2019 0441   MCHC 31.0 04/08/2019 0441   RDW 17.3 (H) 04/08/2019 0441   LYMPHSABS 0.6 (L) 04/08/2019 0441   MONOABS 0.0 (L) 04/08/2019 0441   EOSABS 0.2 04/08/2019 0441   BASOSABS 0.0 04/08/2019 0441    CMP Latest Ref Rng & Units 04/06/2019 04/05/2019 04/02/2019  Glucose 70 - 99 mg/dL 99 105(H) -  BUN 8 - 23 mg/dL 23 24(H) -  Creatinine 0.61 - 1.24 mg/dL 0.94 0.91 1.38(H)  Sodium 135 - 145 mmol/L 148(H) 144 -  Potassium 3.5 - 5.1 mmol/L 3.8 3.7 -  Chloride 98 - 111 mmol/L 110 109 -  CO2 22 - 32 mmol/L 29 28 -  Calcium 8.9 - 10.3 mg/dL 8.9 8.5(L) -  Total Protein 6.5 - 8.1 g/dL - - -  Total Bilirubin 0.3 - 1.2 mg/dL - - -  Alkaline Phos 38 - 126 U/L - - -  AST 15 - 41 U/L - - -  ALT 0 - 44 U/L - - -      Microbiology: Recent Results (from the past 240 hour(s))  Culture, blood (routine x 2)     Status: None (Preliminary result)   Collection Time: 03/31/19  2:40 PM   Specimen: BLOOD  Result Value Ref Range Status   Specimen Description BLOOD LEFT ANTECUBITAL  Final   Special Requests   Final    BOTTLES DRAWN AEROBIC AND ANAEROBIC Blood Culture adequate volume   Culture   Final    NO GROWTH 4 DAYS Performed at [facility], [address]  [place], [place] [address]    Report Status PENDING  Incomplete  Culture, blood (routine x 2)     Status: None (Preliminary result)   Collection Time: 03/31/19  2:40 PM   Specimen: BLOOD  Result Value Ref Range Status   Specimen Description BLOOD BLOOD LEFT HAND  Final   Special Requests   Final    BOTTLES DRAWN AEROBIC AND ANAEROBIC Blood Culture adequate volume   Culture   Final    NO GROWTH 4 DAYS Performed at [facility], [address] [place], [place] [address]  Report Status PENDING  Incomplete  SARS CORONAVIRUS 2 (TAT 6-24 HRS) Nasopharyngeal Nasopharyngeal Swab     Status: None   Collection Time: 03/31/19  2:51 PM   Specimen: Nasopharyngeal Swab  Result Value Ref Range Status   SARS Coronavirus 2 NEGATIVE NEGATIVE Final    Comment: (NOTE) SARS-CoV-2 target nucleic acids are NOT DETECTED. The SARS-CoV-2 RNA is generally detectable in upper and lower respiratory specimens during the acute phase of infection. Negative results do not preclude SARS-CoV-2 infection, do not rule out co-infections with other pathogens, and should not be used as the sole basis for treatment or other patient management decisions. Negative results must be combined with clinical observations, patient history, and epidemiological information. The expected result is Negative. Fact Sheet for Patients: SugarRoll.be Fact Sheet for Healthcare Providers: https://www.woods-mathews.com/ This test is not yet approved or cleared by the [place] FDA and  has been authorized for detection and/or diagnosis of SARS-CoV-2 by FDA under an Emergency Use Authorization (EUA). This EUA will remain  in effect (meaning this test can be used) for the duration of the COVID-19 declaration under Section 56 4(b)(1) of the Act, 21 U.S.C. section 360bbb-3(b)(1), unless the authorization is terminated or revoked sooner. Performed at [facility], [place] [address] [place], [place] [address]   Culture, blood (x 2)     Status: None (Preliminary result)   Collection Time: 04/05/19 11:17 AM   Specimen: BLOOD  Result Value Ref Range Status   Specimen Description BLOOD LEFT ANTECUBITAL  Final   Special Requests   Final    BOTTLES DRAWN AEROBIC AND ANAEROBIC Blood Culture adequate volume   Culture   Final    NO GROWTH 3 DAYS Performed at [facility], [address], [place], [place] [address]    Report Status PENDING  Incomplete  Culture, blood (x 2)     Status: None (Preliminary result)   Collection Time: 04/05/19 11:25 AM   Specimen: BLOOD  Result Value Ref Range Status   Specimen Description BLOOD RIGHT ANTECUBITAL  Final   Special Requests   Final    BOTTLES DRAWN AEROBIC AND ANAEROBIC Blood Culture adequate volume   Culture   Final    NO GROWTH 3 DAYS Performed at [facility], [address] [place], [place] [address]    Report Status PENDING  Incomplete    IMAGING RESULTS: None  ? Impression/Recommendation ?78 y.o. male with a history of rheumatoid arthritis, chronic lymphedema, diabetes, hypertension seizures presented to the ED on 03/31/2019 with bilateral leg swelling, redness been present for nearly 2 weeks.  Severe lymphedema of both legs leading to superficial blisters which is now peeling.  There is some erythema but that is secondary to the swelling.  I do not see any lymphangitis tracking above his thighs but he does have intertrigo in both his inner thigh groin area. The patient has been on antibiotics since 03/27/2019 when he initially was put on clindamycin and ciprofloxacin by the [facility] ED and now since admission he has taken vancomycin and now on doxycycline. Recommend stopping antibiotics Recommend compression bandages for both the legs. Keep the leg elevated.  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  Followed by hematologist.  ANC  is more than thousand He also has low folate level.  ? ? _Candida intertrigo in the inguinal and inguinal femoral area and gluteal area continue nystatin.  Rheumatoid arthritis was on triple therapy with Enbrel, methotrexate, Plaquenil.  All of them are on hold now.  Methotrexate toxicity especially with underlying acute kidney injury which is now resolved.  Also there is a concern that the patient was not taking folate.  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

## 2019-04-08 NOTE — Consult Note (Signed)
WOC Nurse Consult Note: Reason for Consult: requested by bedside staff to return to assess scrotum and right inner thigh wound Wound type: partial thickness skin loss due to moisture and friction Pressure Injury POA: Yes: consulted already Measurement: Scrotum: 0.3cm x 0.3cm x 0.1cm  Right inner thigh: 0.5cm x 0.3cm x 0.1cm  Wound bed: both clean and pink Drainage (amount, consistency, odor) bloody but not significant, scant  Periwound: intact  Dressing procedure/placement/frequency: No topical care needed. OK to protect inner thigh from friction with the scrotum with foam.  Discussed POC with patient and bedside nurse.  Re consult if needed, will not follow at this time. Thanks  Abb Gobert [employer], RN,CWOCN, CNS, [place] [phone])

## 2019-04-08 NOTE — Progress Notes (Signed)
PROGRESS NOTE    Jeremiah [name].  [id number] DOB: 03/09/1941 DOA: 03/31/2019 PCP: Derinda Late, MD    Brief Narrative:  Jeremiah [name]. is a 78 y.o. male with Past medical history of rheumatoid arthritis on immunosuppressive therapy, seizures, HTN, chronic lymphedema. Patient presented with complaints of worsening leg infection. Patient was seen on 03/26/2019 in the ED at [facility] for cellulitis of the leg.  Patient was discharged on oral Cipro and clindamycin. Patient was taking all his medication up to this many days and was compliant with it. There was an area of infection and redness which was marked. Since last 2 days patient's pain was worsening and that area of redness across the marked line and therefore patient decided to come to the hospital. No nausea no vomiting no fever no chills no chest pain abdominal pain no diarrhea no constipation. Patient remains compliant with all his medication. Continue to take all his immunosuppressive medication throughout this infection course.  ED Course: Presents with above complaint.  And referred for admission for failure for outpatient therapy.  At his baseline ambulates with assistance independent for most of his ADL;  manages his medication on his own.   Interim History: pancytopenia CBC Latest Ref Rng & Units 04/08/2019 04/07/2019 04/06/2019  WBC 4.0 - 10.5 K/uL 1.6(L) 2.3(L) -  Hemoglobin 13.0 - 17.0 g/dL 9.2(L) 8.6(L) -  Hematocrit 39.0 - 52.0 % 29.7(L) 27.6(L) -  Platelets 150 - 400 K/uL 51(L) 55(L) 60(L)     Subjective: Has bilateral leg and foot edema, lymphedema and mild tenterness. No CP, AP, fever or chills.   Assessment & Plan:   Principal Problem:   Cellulitis of lower extremity Active Problems:   Rheumatoid arthritis ([place])   Seizure (HCC)   Pressure injury of skin   Other pancytopenia (HCC)   Folic acid deficiency  Cellulitis of lower extremity superimposed on venous stasis changes associated  lymphedema: blood culture no grow so far.  -continue Doxycycline  -f/u  Bx  Anti-infectives (From admission, onward)   Start     Dose/Rate Route Frequency Ordered Stop   04/03/19 1230  doxycycline (VIBRA-TABS) tablet 100 mg     100 mg Oral Every 12 hours 04/03/19 1224     04/02/19 1500  vancomycin (VANCOCIN) 1,250 mg in sodium chloride 0.9 % 250 mL IVPB  Status:  Discontinued     1,250 mg 166.7 mL/hr over 90 Minutes Intravenous Every 48 hours 03/31/19 1902 04/01/19 0844   04/02/19 0800  vancomycin (VANCOCIN) 1,250 mg in sodium chloride 0.9 % 250 mL IVPB  Status:  Discontinued     1,250 mg 166.7 mL/hr over 90 Minutes Intravenous Every 36 hours 04/01/19 0844 04/03/19 1224   04/02/19 0000  minocycline (MINOCIN) 100 MG capsule     100 mg Oral 2 times daily 04/02/19 1254 04/12/19 2359   03/31/19 1830  vancomycin (VANCOCIN) IVPB 1000 mg/200 mL premix     1,000 mg 200 mL/hr over 60 Minutes Intravenous  Once 03/31/19 1819 03/31/19 2209   03/31/19 1445  vancomycin (VANCOCIN) IVPB 1000 mg/200 mL premix     1,000 mg 200 mL/hr over 60 Minutes Intravenous  Once 03/31/19 1436 03/31/19 1643     New pancytopenia with neutropenia: Unclear etiology. ? mtx toxicity.   white blood cell count3>2.9>2.2>1>1.7>2.3>1.6(5.18 Feb 2018)added neutropenic precautions11/15, requested consultation with hematology oncology who thought probableMTX toxicity-will follow their recs. Platelets continue to decline, 51 today 03/29/19, also hgb marginal 9.2. -F/up further recs  with hem onc -Recheck cbc in AM -on folic acid  Acute kidney injury: Suspect was likely prerenal in nature,patient received IV albumin, improving,improved0.94 -f/u by BMP  Rheumatoid arthritis:continueholding immunosuppressants currently, heme-onc as noted concerned about methotrexate toxicity  Neuropathy:  -Continue gabapentin   Seizure disorder: no evidence of breakthrough seizures -ContinueLamictal -seizure precaution   Pressure ulcers present on admission. These were bilateral buttock as well as mid sacrum: Wound care was consultedappreciate their recommendations. -Continue with silver Hydrofiber, and foam changing every 3 days and as needed for soilage  DVT prophylaxis: SCD Code Status: full Family Communication: no, not at bedside Disposition Plan: not ready for discharge. Pt need more work up for pancytopenia per oncologist    Consultants:  Infectious disease ID Oncology  Procedures:    Antimicrobials: Anti-infectives (From admission, onward)   Start     Dose/Rate Route Frequency Ordered Stop   04/03/19 1230  doxycycline (VIBRA-TABS) tablet 100 mg     100 mg Oral Every 12 hours 04/03/19 1224     04/02/19 1500  vancomycin (VANCOCIN) 1,250 mg in sodium chloride 0.9 % 250 mL IVPB  Status:  Discontinued     1,250 mg 166.7 mL/hr over 90 Minutes Intravenous Every 48 hours 03/31/19 1902 04/01/19 0844   04/02/19 0800  vancomycin (VANCOCIN) 1,250 mg in sodium chloride 0.9 % 250 mL IVPB  Status:  Discontinued     1,250 mg 166.7 mL/hr over 90 Minutes Intravenous Every 36 hours 04/01/19 0844 04/03/19 1224   04/02/19 0000  minocycline (MINOCIN) 100 MG capsule     100 mg Oral 2 times daily 04/02/19 1254 04/12/19 2359   03/31/19 1830  vancomycin (VANCOCIN) IVPB 1000 mg/200 mL premix     1,000 mg 200 mL/hr over 60 Minutes Intravenous  Once 03/31/19 1819 03/31/19 2209   03/31/19 1445  vancomycin (VANCOCIN) IVPB 1000 mg/200 mL premix     1,000 mg 200 mL/hr over 60 Minutes Intravenous  Once 03/31/19 1436 03/31/19 1643         Objective: Vitals:   04/08/19 0348 04/08/19 0500 04/08/19 1339 04/08/19 2048  BP: 125/70  (!) 111/51 128/89  Pulse: (!) 103  87 (!) 105  Resp: 20  17 20   Temp: 98.2 F (36.8 C)  98.4 F (36.9 C) 98.3 F (36.8 C)  TempSrc: Oral  Oral Oral  SpO2: 100%  99% 100%  Weight:  112.9 kg    Height:        Intake/Output Summary (Last 24 hours) at 04/08/2019 2243 Last data  filed at 04/08/2019 2131 Gross per 24 hour  Intake -  Output 1250 ml  Net -1250 ml   Filed Weights   04/03/19 0528 04/07/19 0500 04/08/19 0500  Weight: 113 kg 112.9 kg 112.9 kg    Examination:  Physical Exam:  General: Not in acute distress HEENT: PERRL, EOMI, no scleral icterus, No JVD or bruit Cardiac: S1/S2, RRR, No murmurs, gallops or rubs Pulm:  No rales, wheezing, rhonchi or rubs. Abd: Soft, nondistended, nontender, no rebound pain, no organomegaly, BS present Ext: has bilateral leg and foot edema and lymphedema. 1+DP/PT pulse bilaterally Musculoskeletal: No joint deformities, erythema, or stiffness, ROM full Skin: No rashes.  Neuro: Alert and oriented X3, cranial nerves II-XII grossly intact, moves all extremeties normally. Psych: Patient is not psychotic, no suicidal or hemocidal ideation.    Data Reviewed: I have personally reviewed following labs and imaging studies  CBC: Recent Labs  Lab 04/04/19 0502 04/05/19 0826 04/06/19 0550 04/06/19  1041 04/07/19 0437 04/08/19 0441  WBC 2.2* 1.0* 1.7*  --  2.3* 1.6*  NEUTROABS 1.5* 0.2* 1.3*  --  1.4* 0.7*  HGB 9.2* 8.9* 9.0*  --  8.6* 9.2*  HCT 27.8* 27.0* 27.8*  --  27.6* 29.7*  MCV 85.3 85.2 86.1  --  90.2 90.5  PLT 107* 81* PLATELET CLUMPS NOTED ON SMEAR, COUNT APPEARS ADEQUATE 60* 55* 51*   Basic Metabolic Panel: Recent Labs  Lab 04/02/19 0428 04/05/19 0826 04/06/19 0550  NA  --  144 148*  K  --  3.7 3.8  CL  --  109 110  CO2  --  28 29  GLUCOSE  --  105* 99  BUN  --  24* 23  CREATININE 1.38* 0.91 0.94  CALCIUM  --  8.5* 8.9   GFR: Estimated Creatinine Clearance: 85.4 mL/min (by C-G formula based on SCr of 0.94 mg/dL). Liver Function Tests: No results for input(s): AST, ALT, ALKPHOS, BILITOT, PROT, ALBUMIN in the last 168 hours. No results for input(s): LIPASE, AMYLASE in the last 168 hours. No results for input(s): AMMONIA in the last 168 hours. Coagulation Profile: No results for input(s):  INR, PROTIME in the last 168 hours. Cardiac Enzymes: No results for input(s): CKTOTAL, CKMB, CKMBINDEX, TROPONINI in the last 168 hours. BNP (last 3 results) No results for input(s): PROBNP in the last 8760 hours. HbA1C: No results for input(s): HGBA1C in the last 72 hours. CBG: No results for input(s): GLUCAP in the last 168 hours. Lipid Profile: No results for input(s): CHOL, HDL, LDLCALC, TRIG, CHOLHDL, LDLDIRECT in the last 72 hours. Thyroid Function Tests: Recent Labs    04/06/19 0550  TSH 3.279   Anemia Panel: Recent Labs    04/06/19 0550 04/08/19 0441  VITAMINB12 606  --   FOLATE 3.4*  --   RETICCTPCT  --  0.5   Sepsis Labs: No results for input(s): PROCALCITON, LATICACIDVEN in the last 168 hours.  Recent Results (from the past 240 hour(s))  Culture, blood (routine x 2)     Status: None (Preliminary result)   Collection Time: 03/31/19  2:40 PM   Specimen: BLOOD  Result Value Ref Range Status   Specimen Description BLOOD LEFT ANTECUBITAL  Final   Special Requests   Final    BOTTLES DRAWN AEROBIC AND ANAEROBIC Blood Culture adequate volume   Culture   Final    NO GROWTH 4 DAYS Performed at [facility], [address], [place], [place] [address]    Report Status PENDING  Incomplete  Culture, blood (routine x 2)     Status: None (Preliminary result)   Collection Time: 03/31/19  2:40 PM   Specimen: BLOOD  Result Value Ref Range Status   Specimen Description BLOOD BLOOD LEFT HAND  Final   Special Requests   Final    BOTTLES DRAWN AEROBIC AND ANAEROBIC Blood Culture adequate volume   Culture   Final    NO GROWTH 4 DAYS Performed at [facility], [address] [place], [place] [address]    Report Status PENDING  Incomplete  SARS CORONAVIRUS 2 (TAT 6-24 HRS) Nasopharyngeal Nasopharyngeal Swab     Status: None   Collection Time: 03/31/19  2:51 PM   Specimen: Nasopharyngeal Swab  Result Value Ref Range Status   SARS Coronavirus 2 NEGATIVE  NEGATIVE Final    Comment: (NOTE) SARS-CoV-2 target nucleic acids are NOT DETECTED. The SARS-CoV-2 RNA is generally detectable in upper and lower respiratory specimens during  the acute phase of infection. Negative results do not preclude SARS-CoV-2 infection, do not rule out co-infections with other pathogens, and should not be used as the sole basis for treatment or other patient management decisions. Negative results must be combined with clinical observations, patient history, and epidemiological information. The expected result is Negative. Fact Sheet for Patients: SugarRoll.be Fact Sheet for Healthcare Providers: https://www.woods-mathews.com/ This test is not yet approved or cleared by the [place] FDA and  has been authorized for detection and/or diagnosis of SARS-CoV-2 by FDA under an Emergency Use Authorization (EUA). This EUA will remain  in effect (meaning this test can be used) for the duration of the COVID-19 declaration under Section 56 4(b)(1) of the Act, 21 U.S.C. section 360bbb-3(b)(1), unless the authorization is terminated or revoked sooner. Performed at [facility], [place] [address], [place], [place] [address]   Culture, blood (x 2)     Status: None (Preliminary result)   Collection Time: 04/05/19 11:17 AM   Specimen: BLOOD  Result Value Ref Range Status   Specimen Description BLOOD LEFT ANTECUBITAL  Final   Special Requests   Final    BOTTLES DRAWN AEROBIC AND ANAEROBIC Blood Culture adequate volume   Culture   Final    NO GROWTH 3 DAYS Performed at [facility], [address], [place], [place] [address]    Report Status PENDING  Incomplete  Culture, blood (x 2)     Status: None (Preliminary result)   Collection Time: 04/05/19 11:25 AM   Specimen: BLOOD  Result Value Ref Range Status   Specimen Description BLOOD RIGHT ANTECUBITAL  Final   Special Requests   Final    BOTTLES DRAWN AEROBIC AND  ANAEROBIC Blood Culture adequate volume   Culture   Final    NO GROWTH 3 DAYS Performed at [facility], [address], [place], [place] [address]    Report Status PENDING  Incomplete     RN Pressure Injury Documentation: Pressure Injury 04/03/19 Sacrum (Active)  04/03/19 1653  Location: Sacrum  Location Orientation:   Staging:   Wound Description (Comments):   Present on Admission: Yes     Pressure Injury Heel Left;Posterior;Right Unstageable - Full thickness tissue loss in which the base of the ulcer is covered by slough (yellow, tan, gray, green or brown) and/or eschar (tan, brown or black) in the wound bed. (Active)     Location: Heel  Location Orientation: Left;Posterior;Right  Staging: Unstageable - Full thickness tissue loss in which the base of the ulcer is covered by slough (yellow, tan, gray, green or brown) and/or eschar (tan, brown or black) in the wound bed.  Wound Description (Comments):   Present on Admission:      Pressure Injury 04/07/19 Thigh Right;Medial Stage II -  Partial thickness loss of dermis presenting as a shallow open ulcer with a red, pink wound bed without slough. (Active)  04/07/19 1309  Location: Thigh  Location Orientation: Right;Medial  Staging: Stage II -  Partial thickness loss of dermis presenting as a shallow open ulcer with a red, pink wound bed without slough.  Wound Description (Comments):   Present on Admission: No       Radiology Studies: No results found.      Scheduled Meds: . aspirin  81 mg Oral Daily  . Chlorhexidine Gluconate Cloth  6 each Topical Daily  . doxycycline  100 mg Oral Q12H  . folic acid  1 mg Oral Daily  . gabapentin  300 mg Oral QID  . lamoTRIgine  200 mg Oral BID  . lamoTRIgine  25 mg Oral BID  . mouth rinse  15 mL Mouth Rinse BID  . metoprolol tartrate  12.5 mg Oral BID  . pantoprazole  40 mg Oral Daily  . polyethylene glycol  17 g Oral Once  . sodium chloride flush  3 mL Intravenous  Q12H  . traZODone  50 mg Oral QHS   Continuous Infusions: . sodium chloride Stopped (04/02/19 1102)     LOS: 7 days    Time spent: 21 min     Ivor Costa, DO [facility] PAGER is on AMION  If 7PM-7AM, please contact night-coverage [email or web address] Password [facility] 04/08/2019, 10:43 PM

## 2019-04-09 DIAGNOSIS — G40909 Epilepsy, unspecified, not intractable, without status epilepticus: Secondary | ICD-10-CM

## 2019-04-09 DIAGNOSIS — L89892 Pressure ulcer of other site, stage 2: Secondary | ICD-10-CM

## 2019-04-09 DIAGNOSIS — T451X5S Adverse effect of antineoplastic and immunosuppressive drugs, sequela: Secondary | ICD-10-CM

## 2019-04-09 DIAGNOSIS — L89152 Pressure ulcer of sacral region, stage 2: Secondary | ICD-10-CM

## 2019-04-09 LAB — CBC WITH DIFFERENTIAL/PLATELET
Abs Immature Granulocytes: 0 10*3/uL (ref 0.00–0.07)
Basophils Absolute: 0 10*3/uL (ref 0.0–0.1)
Basophils Relative: 0 %
Eosinophils Absolute: 0.2 10*3/uL (ref 0.0–0.5)
Eosinophils Relative: 14 %
HCT: 28.7 % — ABNORMAL LOW (ref 39.0–52.0)
Hemoglobin: 9.3 g/dL — ABNORMAL LOW (ref 13.0–17.0)
Immature Granulocytes: 0 %
Lymphocytes Relative: 55 %
Lymphs Abs: 0.9 10*3/uL (ref 0.7–4.0)
MCH: 28.3 pg (ref 26.0–34.0)
MCHC: 32.4 g/dL (ref 30.0–36.0)
MCV: 87.2 fL (ref 80.0–100.0)
Monocytes Absolute: 0.1 10*3/uL (ref 0.1–1.0)
Monocytes Relative: 6 %
Neutro Abs: 0.4 10*3/uL — ABNORMAL LOW (ref 1.7–7.7)
Neutrophils Relative %: 25 %
Platelets: 33 10*3/uL — ABNORMAL LOW (ref 150–400)
RBC: 3.29 MIL/uL — ABNORMAL LOW (ref 4.22–5.81)
RDW: 17.2 % — ABNORMAL HIGH (ref 11.5–15.5)
Smear Review: DECREASED
WBC: 1.5 10*3/uL — ABNORMAL LOW (ref 4.0–10.5)
nRBC: 0 % (ref 0.0–0.2)

## 2019-04-09 LAB — BASIC METABOLIC PANEL
Anion gap: 10 (ref 5–15)
BUN: 19 mg/dL (ref 8–23)
CO2: 27 mmol/L (ref 22–32)
Calcium: 9 mg/dL (ref 8.9–10.3)
Chloride: 112 mmol/L — ABNORMAL HIGH (ref 98–111)
Creatinine, Ser: 0.98 mg/dL (ref 0.61–1.24)
GFR calc Af Amer: >60 mL/min (ref >60)
GFR calc non Af Amer: >60 mL/min (ref >60)
Glucose, Bld: 93 mg/dL (ref 70–99)
Potassium: 4.2 mmol/L (ref 3.5–5.1)
Sodium: 149 mmol/L — ABNORMAL HIGH (ref 135–145)

## 2019-04-09 LAB — PATHOLOGIST SMEAR REVIEW

## 2019-04-09 LAB — SEROTONIN RELEASE ASSAY (SRA)
SRA .2 IU/mL UFH Ser-aCnc: <1 % (ref 0–20)
SRA 100IU/mL UFH Ser-aCnc: <1 % (ref 0–20)

## 2019-04-09 LAB — SARS CORONAVIRUS 2 (TAT 6-24 HRS): SARS Coronavirus 2: NEGATIVE

## 2019-04-09 NOTE — Progress Notes (Signed)
Physical Therapy Treatment Patient Details Name: Jeremiah [name]. MRN: [id number] DOB: [birth year] Today's Date: 04/09/2019    History of Present Illness  78 y.o. male with Past medical history of rheumatoid arthritis on immunosuppressive therapy, seizures, HTN, chronic lymphedema.  Went to [facility] last week with cellulitis and got antibiotics, however LE swelling had continued along with increased difficulty with mobility and strength.    PT Comments    Pt attempted but overall has poor tolerance to any LE P/AAROM moaning and flinching in pain with even ankle pumps.  Session very limited.    Follow Up Recommendations  SNF     Equipment Recommendations  None recommended by PT    Recommendations for Other Services       Precautions / Restrictions Precautions Precautions: Fall Restrictions Weight Bearing Restrictions: No          Cognition Arousal/Alertness: Awake/alert Behavior During Therapy: WFL for tasks assessed/performed Overall Cognitive Status: Within Functional Limits for tasks assessed                                        Exercises Other Exercises Other Exercises: attemtped but generally poor tolerance to any LE ROM - even gently ankle pumsp caused pt to moan and cry out.    General Comments        Pertinent Vitals/Pain Pain Assessment: Faces Faces Pain Scale: Hurts whole lot Pain Location: with any attempts at LE movement Pain Descriptors / Indicators: Sore;Aching;Crying;Grimacing;Moaning Pain Intervention(s): Limited activity within patient's tolerance    Home Living                      Prior Function            PT Goals (current goals can now be found in the care plan section) Progress towards PT goals: Not progressing toward goals - comment    Frequency    Min 2X/week      PT Plan Current plan remains appropriate    Co-evaluation              AM-PAC PT "6 Clicks" Mobility   Outcome Measure  Help  needed turning from your back to your side while in a flat bed without using bedrails?: Total Help needed moving from lying on your back to sitting on the side of a flat bed without using bedrails?: Total Help needed moving to and from a bed to a chair (including a wheelchair)?: Total Help needed standing up from a chair using your arms (e.g., wheelchair or bedside chair)?: Total Help needed to walk in hospital room?: Total Help needed climbing 3-5 steps with a railing? : Total 6 Click Score: 6    End of Session   Activity Tolerance: Patient limited by pain Patient left: in bed;with call bell/phone within reach;with bed alarm set Nurse Communication: Mobility status       Time: [id number] PT Time Calculation (min) (ACUTE ONLY): 7 min  Charges:  $Therapeutic Exercise: 8-22 mins                     Chesley Noon, PTA 04/09/19, 10:29 AM

## 2019-04-09 NOTE — Progress Notes (Signed)
[facility] Hematology/Oncology Progress Note  Date of admission: 03/31/2019  Hospital day:  04/09/2019  Chief Complaint: Kalep Summerton. is a 78 y.o. male with rheumatoid arthritis and venous stasis/chronic lymphedema who was admitted through the ER with bilateral lower extremity cellultis.  Subjective:  Patient feels a little better.  He states that he has not been out of bed.  Social History: The patient is alone today.  Allergies:  Allergies  Allergen Reactions  . Cefoperazone Anaphylaxis  . Penicillins Anaphylaxis and Other (See Comments)    Has patient had a PCN reaction causing immediate rash, facial/tongue/throat swelling, SOB or lightheadedness with hypotension: Yes Has patient had a PCN reaction causing severe rash involving mucus membranes or skin necrosis: No Has patient had a PCN reaction that required hospitalization No Has patient had a PCN reaction occurring within the last 10 years: No If all of the above answers are "NO", then may proceed with Cephalosporin use.  . Latex Rash    Scheduled Medications: . aspirin  81 mg Oral Daily  . Chlorhexidine Gluconate Cloth  6 each Topical Daily  . doxycycline  100 mg Oral Q12H  . folic acid  1 mg Oral Daily  . gabapentin  300 mg Oral QID  . lamoTRIgine  200 mg Oral BID  . lamoTRIgine  25 mg Oral BID  . mouth rinse  15 mL Mouth Rinse BID  . metoprolol tartrate  12.5 mg Oral BID  . pantoprazole  40 mg Oral Daily  . polyethylene glycol  17 g Oral Once  . sodium chloride flush  3 mL Intravenous Q12H  . traZODone  50 mg Oral QHS    Review of Systems: GENERAL:  Less fatigue.  Tmax 99.0 in past 24 hours.  No sweats. PERFORMANCE STATUS (ECOG):  2 HEENT:  Lips better (miminal bleeding).  Mouth is tender, but able to put in upper dentures.  No visual changes, runny nose. Lungs: No shortness of breath or cough.  No hemoptysis. Cardiac:  No chest pain, palpitations, orthopnea, or PND. GI:  Abdominal  discomfort, improved.  No nausea, vomiting, diarrhea, constipation, melena or hematochezia. GU:  Catheter in place.  No urgency, frequency, dysuria, or hematuria. Musculoskeletal:  Back pain.  No joint pain.  No muscle tenderness. Extremities:  Lower extremity swelling, stable. Skin:  Leg, scrotum, and buttock/sacrum breakdown. Neuro:  History of intermittent confusion.  No headache, numbness or weakness. Endocrine:  No diabetes, thyroid issues, hot flashes or night sweats. Psych:  No mood changes, depression or anxiety. Pain:  Pain associated with mouth. Review of systems:  All other systems reviewed and found to be negative.  Physical Exam: Blood pressure 128/89, pulse (!) 105, temperature 98.3 F (36.8 C), temperature source Oral, resp. rate 20, height 6' (1.829 m), weight 248 lb 14.4 oz (112.9 kg), SpO2 100 %.  GENERAL:  Well developed, well nourished, elderly gentleman sitting comfortably on the medical unit in no acute distress. MENTAL STATUS:  Alert and oriented to person. HEAD:  Near alopecia.  Normocephalic, atraumatic, face symmetric, no Cushingoid features. EYES:  Blue eyes.  Pupils equal round and reactive to light and accomodation.  No conjunctivitis or scleral icterus. ENT:  Lips with some dried blood.  Stomatitisunder tongue.  Mucosal membanes improved.  No thrush.  RESPIRATORY:  Clear to auscultation anteriorly without rales, wheezes or rhonchi. CARDIOVASCULAR:  Regular rate and rhythm without murmur, rub or gallop. ABDOMEN:  Soft, slightly tender on palpation without guarding or rebound  tenderness (stable).  Active bowel sounds and no hepatosplenomegaly.  No masses. SKIN:  Bilateral lower extremity edema, erythema, and dequamation.  No erythema extending upward.  Right inner thigh irritation where legs touching. EXTREMITIES: Bilateral lower extremity 3+ edema.  No palpable cords. NEUROLOGICAL: Engaging.  Answers questions appropriately.  Moves all 4 extremities. PSYCH:   Appropriate.   Results for orders placed or performed during the hospital encounter of 03/31/19 (from the past 48 hour(s))  CBC with Differential     Status: Abnormal   Collection Time: 04/07/19  4:37 AM  Result Value Ref Range   WBC 2.3 (L) 4.0 - 10.5 K/uL   RBC 3.06 (L) 4.22 - 5.81 MIL/uL   Hemoglobin 8.6 (L) 13.0 - 17.0 g/dL   HCT 27.6 (L) 39.0 - 52.0 %   MCV 90.2 80.0 - 100.0 fL   MCH 28.1 26.0 - 34.0 pg   MCHC 31.2 30.0 - 36.0 g/dL   RDW 17.2 (H) 11.5 - 15.5 %   Platelets 55 (L) 150 - 400 K/uL    Comment: Immature Platelet Fraction may be clinically indicated, consider ordering this additional test [id number] CONSISTENT WITH PREVIOUS RESULT    nRBC 0.0 0.0 - 0.2 %   Neutrophils Relative % 57 %   Neutro Abs 1.4 (L) 1.7 - 7.7 K/uL   Lymphocytes Relative 32 %   Lymphs Abs 0.7 0.7 - 4.0 K/uL   Monocytes Relative 1 %   Monocytes Absolute 0.0 (L) 0.1 - 1.0 K/uL   Eosinophils Relative 9 %   Eosinophils Absolute 0.2 0.0 - 0.5 K/uL   Basophils Relative 0 %   Basophils Absolute 0.0 0.0 - 0.1 K/uL   Immature Granulocytes 1 %   Abs Immature Granulocytes 0.02 0.00 - 0.07 K/uL    Comment: Performed at [facility], [address] [place], [place] [address]  Heparin induced platelet Ab (HIT antibody)     Status: None   Collection Time: 04/07/19  4:37 AM  Result Value Ref Range   Heparin Induced Plt Ab 0.058 0.000 - 0.400 OD    Comment: (NOTE) Performed At: [facility] [place], [place] [id number] Rush Farmer MD [id number]   Miscellaneous [facility] test (send-out)     Status: None   Collection Time: 04/07/19  4:37 AM  Result Value Ref Range   [facility] test code [id number]    [facility] test name METHOTREXATE     Comment: Performed at [facility], [address] [place], [place] [address]   Misc [facility] result COMMENT     Comment: (NOTE) Test Ordered: [id number] Methotrexate (MTX), Serum Methotrexate (MTX), Serum      <0.02       [L ]  umol/L   BN     Reference Range: 0.02-5.00                             CALLED [phone] AT 1:05 AM ON 04/08/2019, BUT DID NOT RE CEIVE A RESPONSE. FAXED RESULTS TO [id number].                             Potentially Toxic Ranges:                                      After 24hr >5.00  After 48hr >0.50                                      After 72hr >0.05                                Detection Limit = 0.02        This test was developed and its performance        characteristics determined by [facility]. It has        not been cleared or approved by the Food and        Drug Administration. Performed At: [facility] [place], [place] [id number] Rush Farmer MD [id number]   CBC with Differential     Status: Abnormal   Collection Time: 04/08/19  4:41 AM  Result Value Ref Range   WBC 1.6 (L) 4.0 - 10.5 K/uL   RBC 3.28 (L) 4.22 - 5.81 MIL/uL   Hemoglobin 9.2 (L) 13.0 - 17.0 g/dL   HCT 29.7 (L) 39.0 - 52.0 %   MCV 90.5 80.0 - 100.0 fL   MCH 28.0 26.0 - 34.0 pg   MCHC 31.0 30.0 - 36.0 g/dL   RDW 17.3 (H) 11.5 - 15.5 %   Platelets 51 (L) 150 - 400 K/uL    Comment: Immature Platelet Fraction may be clinically indicated, consider ordering this additional test [id number]    nRBC 0.0 0.0 - 0.2 %   Neutrophils Relative % 46 %   Neutro Abs 0.7 (L) 1.7 - 7.7 K/uL   Lymphocytes Relative 35 %   Lymphs Abs 0.6 (L) 0.7 - 4.0 K/uL   Monocytes Relative 1 %   Monocytes Absolute 0.0 (L) 0.1 - 1.0 K/uL   Eosinophils Relative 15 %   Eosinophils Absolute 0.2 0.0 - 0.5 K/uL   Basophils Relative 0 %   Basophils Absolute 0.0 0.0 - 0.1 K/uL   WBC Morphology MORPHOLOGY UNREMARKABLE    RBC Morphology MIXED RBC POPULATION    Smear Review Normal platelet morphology    Immature Granulocytes 3 %   Abs Immature Granulocytes 0.04 0.00 - 0.07 K/uL    Comment: Performed at [facility], [address] [place], [place] [address]   Reticulocytes     Status: Abnormal   Collection Time: 04/08/19  4:41 AM  Result Value Ref Range   Retic Ct Pct 0.5 0.4 - 3.1 %   RBC. 3.28 (L) 4.22 - 5.81 MIL/uL   Retic Count, Absolute 15.4 (L) 19.0 - 186.0 K/uL   Immature Retic Fract 19.5 (H) 2.3 - 15.9 %    Comment: Performed at [facility], [address] [place], [place] [address]   No results found.  CBCs: 03/26/2019: Hematocrit 33.4.  Hemoglobin 10.7.  MCV 89.0.  Platelets 128,000, WBC 3700. 03/31/2019: Hematocrit 30.5.  Hemoglobin 10.0.  MCV 86.4.  Platelets 206,000.  WBC 3000 (ANC 1600). 04/01/2019: Hematocrit 26.0.  Hemoglobin 8.5.  MCV 85.2.  Platelets 165,000.  WBC 2900.  04/04/2019: Hematocrit 27.8.  Hemoglobin 9.2.  MCV 85.3.  Platelets 107,000.  WBC 2200 (ANC1500).  04/05/2019: Hematocrit 27.0.  Hemoglobin 8.9.  MCV 85.2.  Platelets 81,000.  WBC 1000 ([place] 200).       04/06/2019: Hematocrit 27.8.  Hemoglobin 9.0.  MCV 86.1.  Platelets 60,000.  WBC 1700 (ANC 1300).  04/07/2019: Hematocrit  27.6.  Hemoglobin 8.6.  MCV 90.2.  Platelets 55,000.  WBC 2300 (ANC 1400). 04/08/2019: Hematocrit 29.7.  Hemoglobin 9.2.  MCV 90.5.  Platelets 51,000.  WBC 1600 ([place] 700).  Assessment:  Aquilla Godbee. is a 78 y.o. male with rheumatoid arthritis who was admitted through the ER with bilateral lower extremity cellulitis unresponsive to outpatient management.  Cultures remain negative to date.  He was initially treated in the outpatient department with ciprofloxacin and clindamycin.  On admission he was switched to vancomycin and then doxycycline 04/03/2019.  He was rheumatoid arthritis and receives oral MTX weekly, Enbrel SQ weekly on Fridays, and Plaquenil.  Creatinine was 3.12 on admission and is currently 0.94.  Peripheral smear reveals absolute leukopenia, normochromic, normocytic anemia with mild anisocytosis.  There is thrombocytopenia. There were no blasts or schistocytes.  There were scattered target cells and teardrop  cells..  He has folic acid deficiency.  Folic acid was 3.4 on 123456.  Per patient's report, he stopped folic acid about 1 month ago.  Symptomatically, he has ongoing stomatitis felt secondary to MTX.  ANC is 700.    Plan: 1.   Pancytopenia  Anemia   Hemoglobin has improved today.   Hemoglobin 9.2.     Retic count was < 0.4% (low) on 04/05/2019 and 0.5 (still low) today.   Minor bleeding from lips.   No evidence of hemolysis.  Coombs negative.  LDH slightly elevated.   B12 was 606.  Folate was 3.4 (low).  TSH was 3.279.    Patient now on folic acid 1 mg a day.    Patient previously on folic acid to decrease toxicity of antimetabolite therapy (MTX).     He states that he was told by a doctor at [place] about 1 month ago to stop folic acid.    Folic acid deficiency can also affect other cell lines.    Continue to monitor.  Thrombocytopenia   Platelet count drifting down to 51,000, but rate of decline has decreased.   Patient off Lovenox.   Doubt HIT.      Heparin induced platelet antibody 0.058 (low).    Serotonion release assay pending.  Leukopenia   WBC has drifted back down.   WBC 1000 to 1700 to 2300 to 1600.   [place] 700.  Etiology felt multi-factorial, but likely due to methotrexate (MTX).             Suspect poor marrow reserve given baseline counts with some component of myelosuppression due to infection.              He was taking Plaquenil, MTX, and Enbrel until his admission on 03/31/2019.                         Each medication associated to some degree with myelosuppression.                         He was taking Plaquenil daily, MTX weekly on Mondays, and Enbrel weekly on Fridays.                                     MTX thus given on 03/30/2019 and Enbrel on 03/27/2019.             Patient noted to have creatinine of 3.12 on 03/31/2019.  Suspect MTX toxicity from elevated creatinine (decreased CrCl) and no folic acid support.             Patient  has resultant stomatitis from MTX.                         Recommend oral rinses and lip care.             Lab documents methotrexate clearance   MTX level was < 0.02 on 04/07/2019.             CBC with diff tomorrow.             Discussed with patient again possibility of bone marrow aspirate/biopsy if counts do not recover.  Patient given my office card to share with his wife.  Continue neutropenic precautions.         2.   Bilateral lower extremity cellulitis  Clinically ruddy erythema in legs is stable.  Patient on doxycycline since 04/03/2019.  Appreciate infectious disease consult. 3.   Disposition  Anticipate follow-up in the outpatient department.Lequita Asal, MD  04/09/2019, 12:09 AM

## 2019-04-09 NOTE — Progress Notes (Signed)
Rash observed on patient's right side of hip that radiates up to upper back. MD notified. Will continue to monitor.

## 2019-04-09 NOTE — Progress Notes (Signed)
Date of Admission:  03/31/2019      ID: Jeremiah [name]. is a 78 y.o. male  Principal Problem:   Cellulitis of lower extremity Active Problems:   Rheumatoid arthritis ([place])   Seizure ([place])   Pressure injury of skin   Other pancytopenia ([facility])   Folic acid deficiency   AKI (acute kidney injury) ([place])    Subjective: Stable Has no new complaints Oral ulcers are little improved   Medications:  . aspirin  81 mg Oral Daily  . Chlorhexidine Gluconate Cloth  6 each Topical Daily  . folic acid  1 mg Oral Daily  . gabapentin  300 mg Oral QID  . lamoTRIgine  200 mg Oral BID  . lamoTRIgine  25 mg Oral BID  . mouth rinse  15 mL Mouth Rinse BID  . metoprolol tartrate  12.5 mg Oral BID  . pantoprazole  40 mg Oral Daily  . polyethylene glycol  17 g Oral Once  . sodium chloride flush  3 mL Intravenous Q12H  . traZODone  50 mg Oral QHS    Objective: Vital signs in last 24 hours: Temp:  [98 F (36.7 C)-98.8 F (37.1 C)] 98 F (36.7 C) (11/19 1311) Pulse Rate:  [94-105] 94 (11/19 1311) Resp:  [16-20] 18 (11/19 1311) BP: (118-131)/(60-89) 118/60 (11/19 1311) SpO2:  [100 %] 100 % (11/19 1311) Weight:  [112.9 kg] 112.9 kg (11/19 0500)  PHYSICAL EXAM:  General: Alert, cooperative, no distress, appears stated age.  Pale Lips superficial ulceration, superficial ulceration oral cavity   Lungs: Bilateral air entry Heart: Regular rate and rhythm, no murmur, rub or gallop. Abdomen: Soft, non-tender,not distended. Bowel sounds normal. No masses Extremities: Both the legs have compression bandage    Skin: No rashes or lesions. Or bruising Lymph: Cervical, supraclavicular normal. Neurologic: Grossly non-focal  Lab Results Recent Labs    04/08/19 0441 04/09/19 0616  WBC 1.6* 1.5*  HGB 9.2* 9.3*  HCT 29.7* 28.7*  NA  --  149*  K  --  4.2  CL  --  112*  CO2  --  27  BUN  --  19  CREATININE  --  0.98   Liver Panel No results for input(s): PROT, ALBUMIN, AST, ALT,  ALKPHOS, BILITOT, BILIDIR, IBILI in the last 72 hours. Sedimentation Rate No results for input(s): ESRSEDRATE in the last 72 hours. C-Reactive Protein No results for input(s): CRP in the last 72 hours.  Microbiology: 03/31/2019 blood culture no growth SARS-CoV-2 negative Studies/Results: No results found.   Assessment/Plan: 78 year old male with a history of rheumatoid arthritis, chronic lymphedema, diabetes, hypertension, seizure disorder presented to the ED on 03/31/2019 with bilateral leg swelling, redness  present for nearly 2 weeks  Severe lymphedema/venous edema both legs leading to superficial blisters which is now peeling.  There is some erythema but there is secondary to the swelling and the blisters.  I did not see any lymphangitis tracking above his thighs but he does have intertrigo in both his inner thigh groin area.  The patient has been on antibiotics since 03/27/2019 when he initially was put on clindamycin and ciprofloxacin with [place] [facility] and now since admission he was on vancomycin and then doxycycline.  Recommend compression bandages for both the legs Keep the legs elevated Discontinue doxycycline.  Pancytopenia: Followed by hematologist.  Differential diagnosis was methotrexate induced toxicity with the question of not taking folate and also having a low folate level.  As the counts seem to be dropping  further bone marrow biopsy is being considered. ANC has dropped to 625. Observe closely as patient is at risk for infection.  Severe thrombocytopenia with platelet of 33. Hemoglobin is 9.3 and stable  Candida intertrigo in the inguinal and inner thigh area continue nystatin.  Intergluteal and sacral maceration of the skin with stage II.  Follow wound care recommendations.  Rheumatoid arthritis: Was on triple therapy with Enbrel methotrexate and Plaquenil.  All of them are on hold now.  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  All the sites look good  Discussed the management with the patient.

## 2019-04-09 NOTE — Progress Notes (Addendum)
PROGRESS NOTE    Jeremiah [name].  OIT:[id number] DOB: 1941-02-08 DOA: 03/31/2019 PCP: Derinda Late, MD    Brief Narrative:  Jeremiah [name]. is a 78 y.o. male with Past medical history of rheumatoid arthritis on immunosuppressive therapy, seizures, HTN, chronic lymphedema. Patient presented with complaints of worsening leg infection. Patient was seen on 03/26/2019 in the ED at [facility] for cellulitis of the leg.  Patient was discharged on oral Cipro and clindamycin. Patient was taking all his medication up to this many days and was compliant with it. There was an area of infection and redness which was marked. Since last 2 days patient's pain was worsening and that area of redness across the marked line and therefore patient decided to come to the hospital. No nausea no vomiting no fever no chills no chest pain abdominal pain no diarrhea no constipation. Patient remains compliant with all his medication. Continue to take all his immunosuppressive medication throughout this infection course.  ED Course: Presents with above complaint.  And referred for admission for failure for outpatient therapy.  At his baseline ambulates with assistance independent for most of his ADL;  manages his medication on his own.   Interim History:  - pancytopenia CBC Latest Ref Rng & Units 04/09/2019 04/08/2019 04/07/2019  WBC 4.0 - 10.5 K/uL 1.5(L) 1.6(L) 2.3(L)  Hemoglobin 13.0 - 17.0 g/dL 9.3(L) 9.2(L) 8.6(L)  Hematocrit 39.0 - 52.0 % 28.7(L) 29.7(L) 27.6(L)  Platelets 150 - 400 K/uL 33(L) 51(L) 55(L)    Subjective: Has bilateral leg and foot edema, lymphedema and mild tenterness. No CP, AP, fever or chills.  No nausea, vomiting, diarrhea or abdominal pain.   Assessment & Plan:   Principal Problem:   Cellulitis of lower extremity Active Problems:   Rheumatoid arthritis (HCC)   Seizure (HCC)   Pressure injury of skin   Other pancytopenia (HCC)   Folic acid deficiency   AKI (acute  kidney injury) ([place])  Cellulitis of lower extremity superimposed on venous stasis changes associated lymphedema: blood culture no grow so far. Pt developed severe pancytopenia. ID was consulted 11/19 -->pending recommendation -Doxycycline stopped 11/19 -f/u Bx  Anti-infectives (From admission, onward)   Start     Dose/Rate Route Frequency Ordered Stop   04/03/19 1230  doxycycline (VIBRA-TABS) tablet 100 mg  Status:  Discontinued     100 mg Oral Every 12 hours 04/03/19 1224 04/09/19 1547   04/02/19 1500  vancomycin (VANCOCIN) 1,250 mg in sodium chloride 0.9 % 250 mL IVPB  Status:  Discontinued     1,250 mg 166.7 mL/hr over 90 Minutes Intravenous Every 48 hours 03/31/19 1902 04/01/19 0844   04/02/19 0800  vancomycin (VANCOCIN) 1,250 mg in sodium chloride 0.9 % 250 mL IVPB  Status:  Discontinued     1,250 mg 166.7 mL/hr over 90 Minutes Intravenous Every 36 hours 04/01/19 0844 04/03/19 1224   04/02/19 0000  minocycline (MINOCIN) 100 MG capsule     100 mg Oral 2 times daily 04/02/19 1254 04/12/19 2359   03/31/19 1830  vancomycin (VANCOCIN) IVPB 1000 mg/200 mL premix     1,000 mg 200 mL/hr over 60 Minutes Intravenous  Once 03/31/19 1819 03/31/19 2209   03/31/19 1445  vancomycin (VANCOCIN) IVPB 1000 mg/200 mL premix     1,000 mg 200 mL/hr over 60 Minutes Intravenous  Once 03/31/19 1436 03/31/19 1643     New pancytopenia with neutropenia: Unclear etiology. ? mtx toxicity.  Heme-onc was consulted, Dr. Mike Gip is on  board, may need bone marrow biopsy. -neutropenic precautions11/15,  -F/up further recs with hem onc -Recheck cbc in AM -on folic acid  CBC Latest Ref Rng & Units 04/09/2019 04/08/2019 04/07/2019  WBC 4.0 - 10.5 K/uL 1.5(L) 1.6(L) 2.3(L)  Hemoglobin 13.0 - 17.0 g/dL 9.3(L) 9.2(L) 8.6(L)  Hematocrit 39.0 - 52.0 % 28.7(L) 29.7(L) 27.6(L)  Platelets 150 - 400 K/uL 33(L) 51(L) 55(L)     Acute kidney injury: Suspect was likely prerenal in nature,patient received IV albumin,  improving,improved0.90 -f/u by BMP  Rheumatoid arthritis:continueholding immunosuppressants currently, heme-onc as noted concerned about methotrexate toxicity  Neuropathy:  -Continue gabapentin   Seizure disorder: no evidence of breakthrough seizures -ContinueLamictal -seizure precaution  Pressure ulcers present on admission. These were bilateral buttock as well as mid sacrum: Wound care was consultedappreciate their recommendations. -Continue with silver Hydrofiber, and foam changing every 3 days and as needed for soilage  DVT prophylaxis: SCD Code Status: full Family Communication: yes, wife at bedside Disposition Plan: not ready for discharge. Pt need more work up for pancytopenia per oncologist    Consultants:  Infectious disease ID Oncology  Procedures:    Antimicrobials: Anti-infectives (From admission, onward)   Start     Dose/Rate Route Frequency Ordered Stop   04/03/19 1230  doxycycline (VIBRA-TABS) tablet 100 mg  Status:  Discontinued     100 mg Oral Every 12 hours 04/03/19 1224 04/09/19 1547   04/02/19 1500  vancomycin (VANCOCIN) 1,250 mg in sodium chloride 0.9 % 250 mL IVPB  Status:  Discontinued     1,250 mg 166.7 mL/hr over 90 Minutes Intravenous Every 48 hours 03/31/19 1902 04/01/19 0844   04/02/19 0800  vancomycin (VANCOCIN) 1,250 mg in sodium chloride 0.9 % 250 mL IVPB  Status:  Discontinued     1,250 mg 166.7 mL/hr over 90 Minutes Intravenous Every 36 hours 04/01/19 0844 04/03/19 1224   04/02/19 0000  minocycline (MINOCIN) 100 MG capsule     100 mg Oral 2 times daily 04/02/19 1254 04/12/19 2359   03/31/19 1830  vancomycin (VANCOCIN) IVPB 1000 mg/200 mL premix     1,000 mg 200 mL/hr over 60 Minutes Intravenous  Once 03/31/19 1819 03/31/19 2209   03/31/19 1445  vancomycin (VANCOCIN) IVPB 1000 mg/200 mL premix     1,000 mg 200 mL/hr over 60 Minutes Intravenous  Once 03/31/19 1436 03/31/19 1643         Objective: Vitals:   04/08/19  2048 04/09/19 0500 04/09/19 0547 04/09/19 1311  BP: 128/89  131/76 118/60  Pulse: (!) 105  (!) 102 94  Resp: '20  16 18  '$ Temp: 98.3 F (36.8 C)  98.8 F (37.1 C) 98 F (36.7 C)  TempSrc: Oral  Oral Oral  SpO2: 100%  100% 100%  Weight:  112.9 kg    Height:        Intake/Output Summary (Last 24 hours) at 04/09/2019 1643 Last data filed at 04/09/2019 1007 Gross per 24 hour  Intake 240 ml  Output 1000 ml  Net -760 ml   Filed Weights   04/07/19 0500 04/08/19 0500 04/09/19 0500  Weight: 112.9 kg 112.9 kg 112.9 kg    Examination:  Physical Exam:  General: Not in acute distress HEENT: PERRL, EOMI, no scleral icterus, No JVD or bruit Cardiac: S1/S2, RRR, No murmurs, gallops or rubs Pulm:  No rales, wheezing, rhonchi or rubs. Abd: Soft, nondistended, nontender, no rebound pain, no organomegaly, BS present Ext: has bilateral leg and foot edema and lymphedema. 1+DP/PT pulse  bilaterally Musculoskeletal: No joint deformities, erythema, or stiffness, ROM full Skin: No rashes.  Neuro: Alert and oriented X3, cranial nerves II-XII grossly intact, moves all extremeties normally. Psych: Patient is not psychotic, no suicidal or hemocidal ideation.    Data Reviewed: I have personally reviewed following labs and imaging studies  CBC: Recent Labs  Lab 04/05/19 0826 04/06/19 0550 04/06/19 1041 04/07/19 0437 04/08/19 0441 04/09/19 0616  WBC 1.0* 1.7*  --  2.3* 1.6* 1.5*  NEUTROABS 0.2* 1.3*  --  1.4* 0.7* 0.4*  HGB 8.9* 9.0*  --  8.6* 9.2* 9.3*  HCT 27.0* 27.8*  --  27.6* 29.7* 28.7*  MCV 85.2 86.1  --  90.2 90.5 87.2  PLT 81* PLATELET CLUMPS NOTED ON SMEAR, COUNT APPEARS ADEQUATE 60* 55* 51* 33*   Basic Metabolic Panel: Recent Labs  Lab 04/05/19 0826 04/06/19 0550 04/09/19 0616  NA 144 148* 149*  K 3.7 3.8 4.2  CL 109 110 112*  CO2 '28 29 27  '$ GLUCOSE 105* 99 93  BUN 24* 23 19  CREATININE 0.91 0.94 0.98  CALCIUM 8.5* 8.9 9.0   GFR: Estimated Creatinine Clearance: 81.9  mL/min (by C-G formula based on SCr of 0.98 mg/dL). Liver Function Tests: No results for input(s): AST, ALT, ALKPHOS, BILITOT, PROT, ALBUMIN in the last 168 hours. No results for input(s): LIPASE, AMYLASE in the last 168 hours. No results for input(s): AMMONIA in the last 168 hours. Coagulation Profile: No results for input(s): INR, PROTIME in the last 168 hours. Cardiac Enzymes: No results for input(s): CKTOTAL, CKMB, CKMBINDEX, TROPONINI in the last 168 hours. BNP (last 3 results) No results for input(s): PROBNP in the last 8760 hours. HbA1C: No results for input(s): HGBA1C in the last 72 hours. CBG: No results for input(s): GLUCAP in the last 168 hours. Lipid Profile: No results for input(s): CHOL, HDL, LDLCALC, TRIG, CHOLHDL, LDLDIRECT in the last 72 hours. Thyroid Function Tests: No results for input(s): TSH, T4TOTAL, FREET4, T3FREE, THYROIDAB in the last 72 hours. Anemia Panel: Recent Labs    04/08/19 0441  RETICCTPCT 0.5   Sepsis Labs: No results for input(s): PROCALCITON, LATICACIDVEN in the last 168 hours.  Recent Results (from the past 240 hour(s))  Culture, blood (routine x 2)     Status: None (Preliminary result)   Collection Time: 03/31/19  2:40 PM   Specimen: BLOOD  Result Value Ref Range Status   Specimen Description BLOOD LEFT ANTECUBITAL  Final   Special Requests   Final    BOTTLES DRAWN AEROBIC AND ANAEROBIC Blood Culture adequate volume   Culture   Final    NO GROWTH 4 DAYS Performed at [facility], [address], [place], [place] [address]    Report Status PENDING  Incomplete  Culture, blood (routine x 2)     Status: None (Preliminary result)   Collection Time: 03/31/19  2:40 PM   Specimen: BLOOD  Result Value Ref Range Status   Specimen Description BLOOD BLOOD LEFT HAND  Final   Special Requests   Final    BOTTLES DRAWN AEROBIC AND ANAEROBIC Blood Culture adequate volume   Culture   Final    NO GROWTH 4 DAYS Performed at [facility], [address] [place], [place] [address]    Report Status PENDING  Incomplete  SARS CORONAVIRUS 2 (TAT 6-24 HRS) Nasopharyngeal Nasopharyngeal Swab     Status: None   Collection Time: 03/31/19  2:51 PM   Specimen: Nasopharyngeal Swab  Result Value  Ref Range Status   SARS Coronavirus 2 NEGATIVE NEGATIVE Final    Comment: (NOTE) SARS-CoV-2 target nucleic acids are NOT DETECTED. The SARS-CoV-2 RNA is generally detectable in upper and lower respiratory specimens during the acute phase of infection. Negative results do not preclude SARS-CoV-2 infection, do not rule out co-infections with other pathogens, and should not be used as the sole basis for treatment or other patient management decisions. Negative results must be combined with clinical observations, patient history, and epidemiological information. The expected result is Negative. Fact Sheet for Patients: SugarRoll.be Fact Sheet for Healthcare Providers: https://www.woods-mathews.com/ This test is not yet approved or cleared by the [place] FDA and  has been authorized for detection and/or diagnosis of SARS-CoV-2 by FDA under an Emergency Use Authorization (EUA). This EUA will remain  in effect (meaning this test can be used) for the duration of the COVID-19 declaration under Section 56 4(b)(1) of the Act, 21 U.S.C. section 360bbb-3(b)(1), unless the authorization is terminated or revoked sooner. Performed at [facility], [place] [address], [place], [place] [address]   Culture, blood (x 2)     Status: None (Preliminary result)   Collection Time: 04/05/19 11:17 AM   Specimen: BLOOD  Result Value Ref Range Status   Specimen Description BLOOD LEFT ANTECUBITAL  Final   Special Requests   Final    BOTTLES DRAWN AEROBIC AND ANAEROBIC Blood Culture adequate volume   Culture   Final    NO GROWTH 4 DAYS Performed at [facility], [address],  [place], [place] [address]    Report Status PENDING  Incomplete  Culture, blood (x 2)     Status: None (Preliminary result)   Collection Time: 04/05/19 11:25 AM   Specimen: BLOOD  Result Value Ref Range Status   Specimen Description BLOOD RIGHT ANTECUBITAL  Final   Special Requests   Final    BOTTLES DRAWN AEROBIC AND ANAEROBIC Blood Culture adequate volume   Culture   Final    NO GROWTH 4 DAYS Performed at [facility], [address], [place], [place] [address]    Report Status PENDING  Incomplete  SARS CORONAVIRUS 2 (TAT 6-24 HRS) Nasopharyngeal Nasopharyngeal Swab     Status: None   Collection Time: 04/08/19  2:04 PM   Specimen: Nasopharyngeal Swab  Result Value Ref Range Status   SARS Coronavirus 2 NEGATIVE NEGATIVE Final    Comment: (NOTE) SARS-CoV-2 target nucleic acids are NOT DETECTED. The SARS-CoV-2 RNA is generally detectable in upper and lower respiratory specimens during the acute phase of infection. Negative results do not preclude SARS-CoV-2 infection, do not rule out co-infections with other pathogens, and should not be used as the sole basis for treatment or other patient management decisions. Negative results must be combined with clinical observations, patient history, and epidemiological information. The expected result is Negative. Fact Sheet for Patients: SugarRoll.be Fact Sheet for Healthcare Providers: https://www.woods-mathews.com/ This test is not yet approved or cleared by the [place] FDA and  has been authorized for detection and/or diagnosis of SARS-CoV-2 by FDA under an Emergency Use Authorization (EUA). This EUA will remain  in effect (meaning this test can be used) for the duration of the COVID-19 declaration under Section 56 4(b)(1) of the Act, 21 U.S.C. section 360bbb-3(b)(1), unless the authorization is terminated or revoked sooner. Performed at [facility], [place] [address],  [place], [place] [address]      RN Pressure Injury Documentation: Pressure Injury 04/03/19 Sacrum (  Active)  04/03/19 1653  Location: Sacrum  Location Orientation:   Staging:   Wound Description (Comments):   Present on Admission: Yes     Pressure Injury Heel Left;Posterior;Right Unstageable - Full thickness tissue loss in which the base of the ulcer is covered by slough (yellow, tan, gray, green or brown) and/or eschar (tan, brown or black) in the wound bed. (Active)     Location: Heel  Location Orientation: Left;Posterior;Right  Staging: Unstageable - Full thickness tissue loss in which the base of the ulcer is covered by slough (yellow, tan, gray, green or brown) and/or eschar (tan, brown or black) in the wound bed.  Wound Description (Comments):   Present on Admission:      Pressure Injury 04/07/19 Thigh Right;Medial Stage II -  Partial thickness loss of dermis presenting as a shallow open ulcer with a red, pink wound bed without slough. (Active)  04/07/19 1309  Location: Thigh  Location Orientation: Right;Medial  Staging: Stage II -  Partial thickness loss of dermis presenting as a shallow open ulcer with a red, pink wound bed without slough.  Wound Description (Comments):   Present on Admission: No       Radiology Studies: No results found.      Scheduled Meds: . aspirin  81 mg Oral Daily  . Chlorhexidine Gluconate Cloth  6 each Topical Daily  . folic acid  1 mg Oral Daily  . gabapentin  300 mg Oral QID  . lamoTRIgine  200 mg Oral BID  . lamoTRIgine  25 mg Oral BID  . mouth rinse  15 mL Mouth Rinse BID  . metoprolol tartrate  12.5 mg Oral BID  . pantoprazole  40 mg Oral Daily  . polyethylene glycol  17 g Oral Once  . sodium chloride flush  3 mL Intravenous Q12H  . traZODone  50 mg Oral QHS   Continuous Infusions: . sodium chloride Stopped (04/02/19 1102)     LOS: 8 days    Time spent: 71 min     Ivor Costa, DO [facility] PAGER is on AMION   If 7PM-7AM, please contact night-coverage [email or web address] Password [facility] 04/09/2019, 4:43 PM

## 2019-04-09 NOTE — Care Management Important Message (Signed)
Important Message  Patient Details  Name: Jeremiah [name]. MRN: [id number] Date of Birth: 03/26/1941   Medicare Important Message Given:  Yes     [name] 04/09/2019, 1:27 PM

## 2019-04-09 NOTE — Progress Notes (Signed)
[facility] Hematology/Oncology Progress Note  Date of admission: 03/31/2019  Hospital day:  04/09/2019  Chief Complaint: Abdulmohsen Siefken. is a 78 y.o. male with rheumatoid arthritis and venous stasis/chronic lymphedema who was admitted through the ER with bilateral lower extremity cellultis.  Subjective:  Patient denies any new complaints.    Social History: The patient is alone today.  I spoke with his wife over the phone in the presence of the patient.  Allergies:  Allergies  Allergen Reactions  . Cefoperazone Anaphylaxis  . Penicillins Anaphylaxis and Other (See Comments)    Has patient had a PCN reaction causing immediate rash, facial/tongue/throat swelling, SOB or lightheadedness with hypotension: Yes Has patient had a PCN reaction causing severe rash involving mucus membranes or skin necrosis: No Has patient had a PCN reaction that required hospitalization No Has patient had a PCN reaction occurring within the last 10 years: No If all of the above answers are "NO", then may proceed with Cephalosporin use.  . Latex Rash    Scheduled Medications: . aspirin  81 mg Oral Daily  . Chlorhexidine Gluconate Cloth  6 each Topical Daily  . folic acid  1 mg Oral Daily  . gabapentin  300 mg Oral QID  . lamoTRIgine  200 mg Oral BID  . lamoTRIgine  25 mg Oral BID  . mouth rinse  15 mL Mouth Rinse BID  . metoprolol tartrate  12.5 mg Oral BID  . pantoprazole  40 mg Oral Daily  . polyethylene glycol  17 g Oral Once  . sodium chloride flush  3 mL Intravenous Q12H  . traZODone  50 mg Oral QHS    Review of Systems: GENERAL:  Tired.  No fevers or sweats. PERFORMANCE STATUS (ECOG):  2-3 HEENT:  Lips remained chapped.  Mouth tender, but able to eat.  No visual change or runny nose. Lungs: No shortness of breath or cough.  No hemoptysis. Cardiac:  No chest pain, palpitations, orthopnea, or PND. GI:  Eating a little bit.  No nausea, vomiting, diarrhea, constipation,  melena or hematochezia. GU:  No urgency, frequency, dysuria, or hematuria. Musculoskeletal:  Back pain.  No joint pain.  No muscle tenderness. Extremities:  Lower extremity swelling. Skin:  Denies any new changes.. Neuro:  No headache, numbness or weakness.  Unclear if patient has gotten out of bed. Endocrine:  No diabetes, thyroid issues, hot flashes or night sweats. Psych:  No mood changes, depression or anxiety. Pain:  Lip and mouth pain. Review of systems:  All other systems reviewed and found to be negative.   Physical Exam: Blood pressure 116/70, pulse (!) 102, temperature 98.5 F (36.9 C), temperature source Oral, resp. rate 20, height 6' (1.829 m), weight 248 lb 14.4 oz (112.9 kg), SpO2 100 %.  GENERAL:  Well developed, well nourished, elderly gentleman lying comfortably on an air mattress in no acute distress. MENTAL STATUS:  Alert and oriented to person only. HEAD:  Near alopecia.  Normocephalic, atraumatic, face symmetric, no Cushingoid features. EYES:  Blue eyes.  Pupils equal round and reactive to light and accomodation.  No conjunctivitis or scleral icterus. ENT:  Lips dry with residual dried blood.  Su=blingual ulceration, stable.  Tongue normal. Mucous membranes moist.  RESPIRATORY:  Clear to auscultation anteriorly without rales, wheezes or rhonchi. CARDIOVASCULAR:  Regular rate and rhythm without murmur, rub or gallop. ABDOMEN:  Soft, non-tender, with active bowel sounds, and no appreciable hepatosplenomegaly.  No masses. SKIN:  Lower legs wrapped. EXTREMITIES:  3+ pedal edema with legs wrapped.  No palpable cords. NEUROLOGICAL: Stable. PSYCH:  Appropriate.    Results for orders placed or performed during the hospital encounter of 03/31/19 (from the past 48 hour(s))  CBC with Differential     Status: Abnormal   Collection Time: 04/08/19  4:41 AM  Result Value Ref Range   WBC 1.6 (L) 4.0 - 10.5 K/uL   RBC 3.28 (L) 4.22 - 5.81 MIL/uL   Hemoglobin 9.2 (L) 13.0 - 17.0  g/dL   HCT 29.7 (L) 39.0 - 52.0 %   MCV 90.5 80.0 - 100.0 fL   MCH 28.0 26.0 - 34.0 pg   MCHC 31.0 30.0 - 36.0 g/dL   RDW 17.3 (H) 11.5 - 15.5 %   Platelets 51 (L) 150 - 400 K/uL    Comment: Immature Platelet Fraction may be clinically indicated, consider ordering this additional test [id number]    nRBC 0.0 0.0 - 0.2 %   Neutrophils Relative % 46 %   Neutro Abs 0.7 (L) 1.7 - 7.7 K/uL   Lymphocytes Relative 35 %   Lymphs Abs 0.6 (L) 0.7 - 4.0 K/uL   Monocytes Relative 1 %   Monocytes Absolute 0.0 (L) 0.1 - 1.0 K/uL   Eosinophils Relative 15 %   Eosinophils Absolute 0.2 0.0 - 0.5 K/uL   Basophils Relative 0 %   Basophils Absolute 0.0 0.0 - 0.1 K/uL   WBC Morphology MORPHOLOGY UNREMARKABLE    RBC Morphology MIXED RBC POPULATION    Smear Review Normal platelet morphology    Immature Granulocytes 3 %   Abs Immature Granulocytes 0.04 0.00 - 0.07 K/uL    Comment: Performed at [facility], [address] [place], [place] [address]  Reticulocytes     Status: Abnormal   Collection Time: 04/08/19  4:41 AM  Result Value Ref Range   Retic Ct Pct 0.5 0.4 - 3.1 %   RBC. 3.28 (L) 4.22 - 5.81 MIL/uL   Retic Count, Absolute 15.4 (L) 19.0 - 186.0 K/uL   Immature Retic Fract 19.5 (H) 2.3 - 15.9 %    Comment: Performed at [facility], [address] [place], [place] [address]  SARS CORONAVIRUS 2 (TAT 6-24 HRS) Nasopharyngeal Nasopharyngeal Swab     Status: None   Collection Time: 04/08/19  2:04 PM   Specimen: Nasopharyngeal Swab  Result Value Ref Range   SARS Coronavirus 2 NEGATIVE NEGATIVE    Comment: (NOTE) SARS-CoV-2 target nucleic acids are NOT DETECTED. The SARS-CoV-2 RNA is generally detectable in upper and lower respiratory specimens during the acute phase of infection. Negative results do not preclude SARS-CoV-2 infection, do not rule out co-infections with other pathogens, and should not be used as the sole basis for treatment or other patient management  decisions. Negative results must be combined with clinical observations, patient history, and epidemiological information. The expected result is Negative. Fact Sheet for Patients: SugarRoll.be Fact Sheet for Healthcare Providers: https://www.woods-mathews.com/ This test is not yet approved or cleared by the [place] FDA and  has been authorized for detection and/or diagnosis of SARS-CoV-2 by FDA under an Emergency Use Authorization (EUA). This EUA will remain  in effect (meaning this test can be used) for the duration of the COVID-19 declaration under Section 56 4(b)(1) of the Act, 21 U.S.C. section 360bbb-3(b)(1), unless the authorization is terminated or revoked sooner. Performed at [facility], [place] [address] [place], [place] [address]   CBC with Differential  Status: Abnormal   Collection Time: 04/09/19  6:16 AM  Result Value Ref Range   WBC 1.5 (L) 4.0 - 10.5 K/uL   RBC 3.29 (L) 4.22 - 5.81 MIL/uL   Hemoglobin 9.3 (L) 13.0 - 17.0 g/dL   HCT 28.7 (L) 39.0 - 52.0 %   MCV 87.2 80.0 - 100.0 fL   MCH 28.3 26.0 - 34.0 pg   MCHC 32.4 30.0 - 36.0 g/dL   RDW 17.2 (H) 11.5 - 15.5 %   Platelets 33 (L) 150 - 400 K/uL    Comment: Immature Platelet Fraction may be clinically indicated, consider ordering this additional test [id number]    nRBC 0.0 0.0 - 0.2 %   Neutrophils Relative % 25 %   Neutro Abs 0.4 (L) 1.7 - 7.7 K/uL   Lymphocytes Relative 55 %   Lymphs Abs 0.9 0.7 - 4.0 K/uL   Monocytes Relative 6 %   Monocytes Absolute 0.1 0.1 - 1.0 K/uL   Eosinophils Relative 14 %   Eosinophils Absolute 0.2 0.0 - 0.5 K/uL   Basophils Relative 0 %   Basophils Absolute 0.0 0.0 - 0.1 K/uL   WBC Morphology MORPHOLOGY UNREMARKABLE    RBC Morphology MIXED RBC POPULATION    Smear Review PLATELETS APPEAR DECREASED    Immature Granulocytes 0 %   Abs Immature Granulocytes 0.00 0.00 - 0.07 K/uL    Comment: Performed at [facility], [address] [place], [place] [address]  AM - BMET     Status: Abnormal   Collection Time: 04/09/19  6:16 AM  Result Value Ref Range   Sodium 149 (H) 135 - 145 mmol/L   Potassium 4.2 3.5 - 5.1 mmol/L   Chloride 112 (H) 98 - 111 mmol/L   CO2 27 22 - 32 mmol/L   Glucose, Bld 93 70 - 99 mg/dL   BUN 19 8 - 23 mg/dL   Creatinine, Ser 0.98 0.61 - 1.24 mg/dL   Calcium 9.0 8.9 - 10.3 mg/dL   GFR calc non Af Amer >60 >60 mL/min   GFR calc Af Amer >60 >60 mL/min   Anion gap 10 5 - 15    Comment: Performed at [facility], [address], [place], [place] [address]  Pathologist smear review     Status: None   Collection Time: 04/09/19  6:16 AM  Result Value Ref Range   Path Review Blood smear is reviewed.     Comment: Absolute leukopenia with neutropenia and ANC of 400/uL. Normochromic, normocytic anemia with mild anisocytosis. No significant polychromasia, and low reticulocyte count, indicating RBC hypoproliferation. No increase in schistocytes, less than 0.5% of RBCs. Thrombocytopenia, with unremarkable morphology. The cause of the patient's cytopenias is unclear from morphologic review. Given the low reticulocyte count and lack of giant platelets, decreased marrow production is favored. This may be seen in the setting of autoimmune/rheumatologic disease, medication/toxin effect, including immunosuppressive therapy, nutritional deficiencies, and viral illness. Clinical correlation is recommended. Reviewed by Kathi Simpers, M.D. Performed at [facility], [address] [place],  [address]    No results found.  CBCs: 03/26/2019: Hematocrit 33.4.  Hemoglobin 10.7.  MCV 89.0.  Platelets 128,000, WBC 3700. 03/31/2019: Hematocrit 30.5.  Hemoglobin 10.0.  MCV 86.4.  Platelets 206,000.  WBC 3000 (ANC 1600). 04/01/2019: Hematocrit 26.0.  Hemoglobin 8.5.  MCV 85.2.  Platelets 165,000.  WBC 2900.  04/04/2019: Hematocrit 27.8.  Hemoglobin 9.2.  MCV 85.3.   Platelets 107,000.  WBC 2200 (ANC1500).  04/05/2019: Hematocrit 27.0.  Hemoglobin 8.9.  MCV 85.2.  Platelets 81,000.  WBC 1000 ([place] 200).       04/06/2019: Hematocrit 27.8.  Hemoglobin 9.0.  MCV 86.1.  Platelets 60,000.  WBC 1700 (ANC 1300).  04/07/2019: Hematocrit 27.6.  Hemoglobin 8.6.  MCV 90.2.  Platelets 55,000.  WBC 2300 (ANC 1400). 04/08/2019: Hematocrit 29.7.  Hemoglobin 9.2.  MCV 90.5.  Platelets 51,000.  WBC 1600 ([place] 700). 04/09/2019: Hematocrit 28.7.  Hemoglobin 9.3.  MCV 87.2.  Platelets 33,000.  WBC 1500 ([place] 400).   Assessment:  Welles Selig. is a 78 y.o. male with rheumatoid arthritis who was admitted through the ER with bilateral lower extremity cellulitis unresponsive to outpatient management.  Cultures remain negative to date.  He was initially treated in the outpatient department with ciprofloxacin and clindamycin.  On admission he was switched to vancomycin and then doxycycline 04/03/2019.  He was rheumatoid arthritis and receives oral MTX weekly, Enbrel SQ weekly on Fridays, and Plaquenil.  Creatinine was 3.12 on admission and is currently 0.94.  Peripheral smear reveals absolute leukopenia with neutropenia and ANC of 400. RBC are normochromic, normocytic anemia with mild anisocytosis. There is no significant polychromasia, and low reticulocyte count, indicating RBC hypoproliferation. There is no increase in schistocytes, less than 0.5% of RBCs.  There is thrombocytopenia, with unremarkable morphology. Etiology is likely due to decreased marrow production is favored.   He has folic acid deficiency.  Folic acid was 3.4 on [id number].  Per patient's report, he stopped folic acid about 1 month ago.  Symptomatically, he has had no significant improvement.  ANC is 400.    Plan: 1.   Pancytopenia  Anemia   Hemoglobin is stable at 9.3.     Retic count was < 0.4% (low) on 04/05/2019 and 0.5 (still low) on 04/08/2019.   Minor bleeding from lips.   No evidence of  hemolysis.  Coombs negative.  LDH slightly elevated.   B12 was 606.  Folate was 3.4 (low).  TSH was 3.279.   Folic acid began [id number].   Continue to monitor.  Thrombocytopenia   Platelet count continue to drift down and is now 33,000.   Patient off Lovenox.   No evidence of heparin induced thrombocytopenia    Heparin induced platelet antibody 0.058 (low).    Serotonion release assay is < 1%.   Etiology felt secondary to decreased marrow production.  Leukopenia   WBC 1000 to 1700 to 2300 to 1600 to 1500.   ANC 400.  Etiology felt most likely due to methotrexate (MTX) in conjunction with low folic acid.   Supported by oral stomatitis.     Counts continue to drop 10 days after last MTX on 03/30/2019.   MTX level was < 0.02 on 04/07/2019.   Other etiologies include marrow replacement, viral suppression.    Check flow cytometry.    Check parvovirus B19 serologies.             Discuss concern with patient for ongoing pancytopenia.  Discussed at length with patient's wife today.   Etiology may be related to prolonged myelosuppression secondary to MTX.   Given lack of recovery, discuss consideration of bone marrow aspirate and biopsy.    Procedure described.  Risks and benefits reviewed.    Patient and his wife would like to proceed.    Spoke with interventional radiology.  Unsure if procedure will be able to be performed tomorrow.    Recommend NPO after midnight.  Continue neutropenic  precautions.         2.   Bilateral lower extremity cellulitis  Appreciate ID consult.  Patient with severe lymphedema/venous edema.  Patient in compression bandages.  Doxycycline discontinued. 3.   Disposition  Patient remains extremely debilitated.  Patient being considered for discharge to care facility.  Patient may require platelet transfusion support unless platelet count rebounds.  Anticipate follow-up in the hematology clinic.    Lequita Asal, MD  04/09/2019, 9:34 PM

## 2019-04-10 ENCOUNTER — Inpatient Hospital Stay: Payer: Medicare Other

## 2019-04-10 DIAGNOSIS — L89509 Pressure ulcer of unspecified ankle, unspecified stage: Secondary | ICD-10-CM

## 2019-04-10 DIAGNOSIS — D61818 Other pancytopenia: Secondary | ICD-10-CM | POA: Diagnosis present

## 2019-04-10 DIAGNOSIS — T451X5A Adverse effect of antineoplastic and immunosuppressive drugs, initial encounter: Secondary | ICD-10-CM

## 2019-04-10 LAB — CULTURE, BLOOD (ROUTINE X 2)
Culture: NO GROWTH
Culture: NO GROWTH
Special Requests: ADEQUATE
Special Requests: ADEQUATE

## 2019-04-10 LAB — CBC WITH DIFFERENTIAL/PLATELET
Abs Immature Granulocytes: 0 10*3/uL (ref 0.00–0.07)
Basophils Absolute: 0 10*3/uL (ref 0.0–0.1)
Basophils Relative: 0 %
Eosinophils Absolute: 0.3 10*3/uL (ref 0.0–0.5)
Eosinophils Relative: 20 %
HCT: 31 % — ABNORMAL LOW (ref 39.0–52.0)
Hemoglobin: 9.7 g/dL — ABNORMAL LOW (ref 13.0–17.0)
Immature Granulocytes: 0 %
Lymphocytes Relative: 60 %
Lymphs Abs: 0.9 10*3/uL (ref 0.7–4.0)
MCH: 28.4 pg (ref 26.0–34.0)
MCHC: 31.3 g/dL (ref 30.0–36.0)
MCV: 90.6 fL (ref 80.0–100.0)
Monocytes Absolute: 0.1 10*3/uL (ref 0.1–1.0)
Monocytes Relative: 8 %
Neutro Abs: 0.2 10*3/uL — ABNORMAL LOW (ref 1.7–7.7)
Neutrophils Relative %: 12 %
Platelets: 23 10*3/uL — CL (ref 150–400)
RBC: 3.42 MIL/uL — ABNORMAL LOW (ref 4.22–5.81)
RDW: 17.8 % — ABNORMAL HIGH (ref 11.5–15.5)
Smear Review: NORMAL
WBC: 1.5 10*3/uL — ABNORMAL LOW (ref 4.0–10.5)
nRBC: 0 % (ref 0.0–0.2)

## 2019-04-10 LAB — BASIC METABOLIC PANEL
Anion gap: 10 (ref 5–15)
BUN: 18 mg/dL (ref 8–23)
CO2: 25 mmol/L (ref 22–32)
Calcium: 8.6 mg/dL — ABNORMAL LOW (ref 8.9–10.3)
Chloride: 110 mmol/L (ref 98–111)
Creatinine, Ser: 1.01 mg/dL (ref 0.61–1.24)
GFR calc Af Amer: >60 mL/min (ref >60)
GFR calc non Af Amer: >60 mL/min (ref >60)
Glucose, Bld: 85 mg/dL (ref 70–99)
Potassium: 3.5 mmol/L (ref 3.5–5.1)
Sodium: 145 mmol/L (ref 135–145)

## 2019-04-10 LAB — IMMATURE PLATELET FRACTION: Immature Platelet Fraction: 7 % (ref 1.2–8.6)

## 2019-04-10 LAB — MISC LABCORP TEST (SEND OUT): Labcorp test code: 7781

## 2019-04-10 MED ORDER — LORAZEPAM 2 MG/ML IJ SOLN: 1.0000 mg | Freq: Once | INTRAMUSCULAR | Status: DC

## 2019-04-10 MED ORDER — FENTANYL CITRATE (PF) 100 MCG/2ML IJ SOLN
INTRAMUSCULAR | Status: DC | PRN
  Administered 2019-04-10: 25 ug via INTRAVENOUS

## 2019-04-10 MED ORDER — MAGIC MOUTHWASH W/LIDOCAINE
5.0000 mL | Freq: Three times a day (TID) | ORAL | Status: DC | PRN
  Filled 2019-04-10: qty 5

## 2019-04-10 MED ORDER — MIDAZOLAM HCL 2 MG/2ML IJ SOLN
INTRAMUSCULAR | Status: AC
  Filled 2019-04-10: qty 2

## 2019-04-10 MED ORDER — HEPARIN SOD (PORK) LOCK FLUSH 100 UNIT/ML IV SOLN
INTRAVENOUS | Status: AC
  Filled 2019-04-10: qty 5

## 2019-04-10 MED ORDER — SODIUM CHLORIDE 0.9 % IV SOLN
1.0000 g | Freq: Three times a day (TID) | INTRAVENOUS | Status: DC
  Administered 2019-04-11 – 2019-04-15 (×14): 1 g via INTRAVENOUS
  Filled 2019-04-10 (×16): qty 1

## 2019-04-10 MED ORDER — VANCOMYCIN HCL 10 G IV SOLR
2000.0000 mg | Freq: Once | INTRAVENOUS | Status: AC
  Administered 2019-04-11: 2000 mg via INTRAVENOUS
  Filled 2019-04-10: qty 2000

## 2019-04-10 MED ORDER — SODIUM CHLORIDE 0.9 % IV BOLUS
1000.0000 mL | Freq: Once | INTRAVENOUS | Status: AC
  Administered 2019-04-10: 1000 mL via INTRAVENOUS

## 2019-04-10 MED ORDER — FENTANYL CITRATE (PF) 100 MCG/2ML IJ SOLN
INTRAMUSCULAR | Status: AC
  Filled 2019-04-10: qty 2

## 2019-04-10 MED ORDER — MIDAZOLAM HCL 2 MG/2ML IJ SOLN
INTRAMUSCULAR | Status: DC | PRN
  Administered 2019-04-10: 1 mg via INTRAVENOUS

## 2019-04-10 NOTE — Progress Notes (Signed)
[facility] Hematology/Oncology Progress Note  Date of admission: 03/31/2019  Hospital day:  04/10/2019  Chief Complaint: Eashan Schipani. is a 78 y.o. male with rheumatoid arthritis and venous stasis/chronic lymphedema who was admitted through the ER with bilateral lower extremity cellultis.  Subjective:  Patient denies any complaints.    Social History: The patient is accompanied by his wife today.  Allergies:  Allergies  Allergen Reactions  . Cefoperazone Anaphylaxis  . Penicillins Anaphylaxis and Other (See Comments)    Has patient had a PCN reaction causing immediate rash, facial/tongue/throat swelling, SOB or lightheadedness with hypotension: Yes Has patient had a PCN reaction causing severe rash involving mucus membranes or skin necrosis: No Has patient had a PCN reaction that required hospitalization No Has patient had a PCN reaction occurring within the last 10 years: No If all of the above answers are "NO", then may proceed with Cephalosporin use.  . Latex Rash    Scheduled Medications: . aspirin  81 mg Oral Daily  . Chlorhexidine Gluconate Cloth  6 each Topical Daily  . folic acid  1 mg Oral Daily  . gabapentin  300 mg Oral QID  . lamoTRIgine  200 mg Oral BID  . lamoTRIgine  25 mg Oral BID  . mouth rinse  15 mL Mouth Rinse BID  . metoprolol tartrate  12.5 mg Oral BID  . pantoprazole  40 mg Oral Daily  . polyethylene glycol  17 g Oral Once  . sodium chloride flush  3 mL Intravenous Q12H  . traZODone  50 mg Oral QHS    Review of Systems: GENERAL:  No complaints.  Tmax 101.3.  No sweats. PERFORMANCE STATUS (ECOG):  3 HEENT:  Lips chapped and bleeding at times.  Mouth remains tender (no dentures today).  No visual changes or runny nose. Lungs: No shortness of breath or cough.  No hemoptysis. Cardiac:  No chest pain, palpitations, orthopnea, or PND. GI:  Little oral intawe.  No nausea, vomiting, diarrhea, constipation, melena or  hematochezia. GU:  Catheter remains in place.  No urgency, frequency, dysuria, or hematuria. Musculoskeletal:  Back pain.  No joint pain.  No muscle tenderness. Extremities:  Lower extremity swelling improved with wrapping. Skin:  No new changes. Neuro:  No headache, numbness or weakness, balance or coordination issues. Endocrine:  No diabetes, thyroid issues, hot flashes or night sweats. Psych:  No mood changes, depression or anxiety. Pain: Oral pain. Review of systems:  All other systems reviewed and found to be negative.   Physical Exam: Blood pressure 94/66, pulse (!) 108, temperature 99.9 F (37.7 C), temperature source Axillary, resp. rate 16, height 6' (1.829 m), weight 260 lb 9.3 oz (118.2 kg), SpO2 98 %.  GENERAL:  Well developed, well nourished, elderly gentleman lying comfortably on an air mattress in no acute distress. MENTAL STATUS:  Alert and oriented to person only.  He knows the month (November), but not the year.  He believes he is in a SNF. HEAD:  Near alopecia.  Normocephalic, atraumatic, face symmetric, no Cushingoid features. EYES:  Blue eyes.  Pupils equal round and reactive to light and accomodation.  No conjunctivitis or scleral icterus. ENT:  Lips with dried blood.  Sublingual ulceration. Tongue normal. No thrush.  RESPIRATORY:  Clear to auscultation anteriorly without rales, wheezes or rhonchi. CARDIOVASCULAR:  Regular rate and rhythm without murmur, rub or gallop. ABDOMEN:  Soft, suprapubic tenderness (chronic) without guarding or rebound tenderness.  No appreciable hepatosplenomegaly.  No masses.  SKIN:  Lower legs wrapped. EXTREMITIES: Improving bilateral lower extremity edema.  Nopalpable cords. NEUROLOGICAL: Interactive.  Remains mildly confused. PSYCH:  Appropriate.    Results for orders placed or performed during the hospital encounter of 03/31/19 (from the past 48 hour(s))  CBC with Differential     Status: Abnormal   Collection Time: 04/09/19  6:16 AM   Result Value Ref Range   WBC 1.5 (L) 4.0 - 10.5 K/uL   RBC 3.29 (L) 4.22 - 5.81 MIL/uL   Hemoglobin 9.3 (L) 13.0 - 17.0 g/dL   HCT 28.7 (L) 39.0 - 52.0 %   MCV 87.2 80.0 - 100.0 fL   MCH 28.3 26.0 - 34.0 pg   MCHC 32.4 30.0 - 36.0 g/dL   RDW 17.2 (H) 11.5 - 15.5 %   Platelets 33 (L) 150 - 400 K/uL    Comment: Immature Platelet Fraction may be clinically indicated, consider ordering this additional test [id number]    nRBC 0.0 0.0 - 0.2 %   Neutrophils Relative % 25 %   Neutro Abs 0.4 (L) 1.7 - 7.7 K/uL   Lymphocytes Relative 55 %   Lymphs Abs 0.9 0.7 - 4.0 K/uL   Monocytes Relative 6 %   Monocytes Absolute 0.1 0.1 - 1.0 K/uL   Eosinophils Relative 14 %   Eosinophils Absolute 0.2 0.0 - 0.5 K/uL   Basophils Relative 0 %   Basophils Absolute 0.0 0.0 - 0.1 K/uL   WBC Morphology MORPHOLOGY UNREMARKABLE    RBC Morphology MIXED RBC POPULATION    Smear Review PLATELETS APPEAR DECREASED    Immature Granulocytes 0 %   Abs Immature Granulocytes 0.00 0.00 - 0.07 K/uL    Comment: Performed at [facility], [address] [place], [place] [address]  AM - BMET     Status: Abnormal   Collection Time: 04/09/19  6:16 AM  Result Value Ref Range   Sodium 149 (H) 135 - 145 mmol/L   Potassium 4.2 3.5 - 5.1 mmol/L   Chloride 112 (H) 98 - 111 mmol/L   CO2 27 22 - 32 mmol/L   Glucose, Bld 93 70 - 99 mg/dL   BUN 19 8 - 23 mg/dL   Creatinine, Ser 0.98 0.61 - 1.24 mg/dL   Calcium 9.0 8.9 - 10.3 mg/dL   GFR calc non Af Amer >60 >60 mL/min   GFR calc Af Amer >60 >60 mL/min   Anion gap 10 5 - 15    Comment: Performed at [facility], [address], [place], [place] [address]  Pathologist smear review     Status: None   Collection Time: 04/09/19  6:16 AM  Result Value Ref Range   Path Review Blood smear is reviewed.     Comment: Absolute leukopenia with neutropenia and ANC of 400/uL. Normochromic, normocytic anemia with mild anisocytosis. No significant polychromasia, and  low reticulocyte count, indicating RBC hypoproliferation. No increase in schistocytes, less than 0.5% of RBCs. Thrombocytopenia, with unremarkable morphology. The cause of the patient's cytopenias is unclear from morphologic review. Given the low reticulocyte count and lack of giant platelets, decreased marrow production is favored. This may be seen in the setting of autoimmune/rheumatologic disease, medication/toxin effect, including immunosuppressive therapy, nutritional deficiencies, and viral illness. Clinical correlation is recommended. Reviewed by Kathi Simpers, M.D. Performed at [facility], [address] [place], [place] [address]   CBC with Differential     Status: Abnormal   Collection Time: 04/10/19  7:01 AM  Result  Value Ref Range   WBC 1.5 (L) 4.0 - 10.5 K/uL   RBC 3.42 (L) 4.22 - 5.81 MIL/uL   Hemoglobin 9.7 (L) 13.0 - 17.0 g/dL   HCT 31.0 (L) 39.0 - 52.0 %   MCV 90.6 80.0 - 100.0 fL   MCH 28.4 26.0 - 34.0 pg   MCHC 31.3 30.0 - 36.0 g/dL   RDW 17.8 (H) 11.5 - 15.5 %   Platelets 23 (LL) 150 - 400 K/uL    Comment: PLATELET COUNT CONFIRMED BY SMEAR Immature Platelet Fraction may be clinically indicated, consider ordering this additional test [id number] THIS CRITICAL RESULT HAS VERIFIED AND BEEN CALLED TO MOLLY WEISMILLER RN BY RASHA HASSAN ON 11 20 2020 AT 0839, AND HAS BEEN READ BACK.     nRBC 0.0 0.0 - 0.2 %   Neutrophils Relative % 12 %   Neutro Abs 0.2 (L) 1.7 - 7.7 K/uL   Lymphocytes Relative 60 %   Lymphs Abs 0.9 0.7 - 4.0 K/uL   Monocytes Relative 8 %   Monocytes Absolute 0.1 0.1 - 1.0 K/uL   Eosinophils Relative 20 %   Eosinophils Absolute 0.3 0.0 - 0.5 K/uL   Basophils Relative 0 %   Basophils Absolute 0.0 0.0 - 0.1 K/uL   WBC Morphology MORPHOLOGY UNREMARKABLE    Smear Review Normal platelet morphology     Comment: PLATELET COUNT CONFIRMED BY SMEAR   Immature Granulocytes 0 %   Abs Immature Granulocytes 0.00 0.00 - 0.07 K/uL   Schistocytes  PRESENT     Comment: Performed at [facility], [address] [place], [place] [address]  AM - BMET     Status: Abnormal   Collection Time: 04/10/19  7:01 AM  Result Value Ref Range   Sodium 145 135 - 145 mmol/L   Potassium 3.5 3.5 - 5.1 mmol/L   Chloride 110 98 - 111 mmol/L   CO2 25 22 - 32 mmol/L   Glucose, Bld 85 70 - 99 mg/dL   BUN 18 8 - 23 mg/dL   Creatinine, Ser 1.01 0.61 - 1.24 mg/dL   Calcium 8.6 (L) 8.9 - 10.3 mg/dL   GFR calc non Af Amer >60 >60 mL/min   GFR calc Af Amer >60 >60 mL/min   Anion gap 10 5 - 15    Comment: Performed at [facility], [address] [place], [place] [address]  Immature Platelet Fraction     Status: None   Collection Time: 04/10/19  1:48 PM  Result Value Ref Range   Immature Platelet Fraction 7.0 1.2 - 8.6 %    Comment: Performed at [facility], [address] [place], [place] [address]   Ct Bone Marrow Biopsy & Aspiration  Result Date: 04/10/2019 CLINICAL DATA:  Pancytopenia and need for bone marrow biopsy. EXAM: CT GUIDED BONE MARROW ASPIRATION AND BIOPSY ANESTHESIA/SEDATION: Versed 1.0 mg IV, Fentanyl 50 mcg IV Total Moderate Sedation Time:   18 minutes. The patient's level of consciousness and physiologic status were continuously monitored during the procedure by Radiology nursing. PROCEDURE: The procedure risks, benefits, and alternatives were explained to the patient's son. Questions regarding the procedure were encouraged and answered. The patient's son understands and consents to the procedure. A time out was performed prior to initiating the procedure. The right gluteal region was prepped with chlorhexidine. Sterile gown and sterile gloves were used for the procedure. Local anesthesia was provided with 1% Lidocaine. Under CT guidance, an 11 gauge On Control bone cutting needle  was advanced from a posterior approach into the right iliac bone. Needle positioning was confirmed with CT. Initial non heparinized and  heparinized aspirate samples were obtained of bone marrow. Core biopsy was performed via the On Control drill needle. COMPLICATIONS: None FINDINGS: Inspection of initial aspirate did reveal visible particles. Intact core biopsy sample was obtained. IMPRESSION: CT guided bone marrow biopsy of right posterior iliac bone with both aspirate and core samples obtained. Electronically Signed   By: Aletta Edouard M.D.   On: 04/10/2019 11:14    CBCs: 03/26/2019: Hematocrit 33.4.  Hemoglobin 10.7.  MCV 89.0.  Platelets 128,000, WBC 3700. 03/31/2019: Hematocrit 30.5.  Hemoglobin 10.0.  MCV 86.4.  Platelets 206,000.  WBC 3000 (ANC 1600). 04/01/2019: Hematocrit 26.0.  Hemoglobin 8.5.  MCV 85.2.  Platelets 165,000.  WBC 2900.  04/04/2019: Hematocrit 27.8.  Hemoglobin 9.2.  MCV 85.3.  Platelets 107,000.  WBC 2200 (ANC1500).  04/05/2019: Hematocrit 27.0.  Hemoglobin 8.9.  MCV 85.2.  Platelets 81,000.  WBC 1000 ([place] 200).       04/06/2019: Hematocrit 27.8.  Hemoglobin 9.0.  MCV 86.1.  Platelets 60,000.  WBC 1700 (ANC 1300).  04/07/2019: Hematocrit 27.6.  Hemoglobin 8.6.  MCV 90.2.  Platelets 55,000.  WBC 2300 (ANC 1400). 04/08/2019: Hematocrit 29.7.  Hemoglobin 9.2.  MCV 90.5.  Platelets 51,000.  WBC 1600 ([place] 700). 04/09/2019: Hematocrit 28.7.  Hemoglobin 9.3.  MCV 87.2.  Platelets 33,000.  WBC 1500 ([place] 400). 04/10/2019: Hematocrit 31.0.  Hemoglobin 9.7.  MCV 90.6.  Platelets 23,000.  WBC 1500 ([place] 200).   Assessment:  Duncan Alejandro. is a 78 y.o. male with rheumatoid arthritis who was admitted through the ER with bilateral lower extremity cellulitis unresponsive to outpatient management.  Cultures remain negative to date.  He was initially treated in the outpatient department with ciprofloxacin and clindamycin.  On admission he was switched to vancomycin and then doxycycline 04/03/2019.  He was rheumatoid arthritis and receives oral MTX weekly, Enbrel SQ weekly on Fridays, and Plaquenil.  Creatinine was  3.12 on admission and is currently 0.94.  Peripheral smear reveals absolute leukopenia with neutropenia and ANC of 400. RBC are normochromic, normocytic anemia with mild anisocytosis. There is no significant polychromasia, and low reticulocyte count, indicating RBC hypoproliferation. There is no increase in schistocytes, less than 0.5% of RBCs.  There is thrombocytopenia, with unremarkable morphology. Etiology is likely due to decreased marrow production is favored.   He has folic acid deficiency.  Folic acid was 3.4 on 41/28/7867.  Per patient's report, he stopped folic acid about 1 month ago.  Symptomatically, he denies any complaint.  ANC is 200.    Plan: 1.   Pancytopenia  Anemia   Hemoglobin is improving at 9.7.     Retic count was < 0.4% (low) on 04/05/2019 and 0.5 (still low) on 04/08/2019.   No bleeding per patient's wife.   He has residual old blood on his lips.   No evidence of hemolysis.  Coombs negative.  LDH slightly elevated.   B12 was 606.  Folate was 3.4 (low).  TSH was 3.279.   Folic acid began 67/20/9470.  Thrombocytopenia   Platelet count continues to drift down and is now 23,000.   Patient off Lovenox.   No evidence of heparin induced thrombocytopenia    Heparin induced platelet antibody 0.058 (low).    Serotonion release assay is < 1%.   Etiology felt secondary to decreased marrow production.   Bone marrow performed today.  Leukopenia  WBC 1000 to 1700 to 2300 to 1600 to 1500.   ANC 200.  Etiology felt due to myelosuppression from methotrexate (MTX) in conjunction with low folate (per patient, he did not take for 1 month).   Patient with stable stomatitis.     Counts continue to drop 11 days after last MTX on 03/30/2019.   MTX level was < 0.02 on 04/07/2019.   Other etiologies include marrow replacement, viral suppression.    Flow cytometry pending.    Parvovirus B19 serologies pending.    HIV testing pending.             Await bone marrow aspirate and  biopsy performed today.  Daily CBC with diff.  Continue neutropenic precautions.         2.   Bilateral lower extremity cellulitis  Appreciate ID consult.  Patient with severe lymphedema/venous edema felt mimicking cellulitis.  Patient in compression bandages.  Patient off antibiotics.  If febrile (100.4 or higher), blood and urine cultures, CXR, and initiation of broad spectrum antibiotics. 3.   Disposition  Patient is extremely debiliated.  Patient remains hospitalized.    Lequita Asal, MD  04/10/2019, 10:28 PM

## 2019-04-10 NOTE — H&P (Signed)
Chief Complaint: Patient was seen in consultation today for bone marrow biopsy at the request of Dr. Nolon Stalls  Referring Physician(s): Nolon Stalls  Patient Status: [facility] - In-pt  History of Present Illness: Jeremiah Haskew. is a 78 y.o. male with a history of rheumatoid arthritis and venous stasis/chronic lymphedema who was admitted through the ER with bilateral lower extremity cellultis. Noted to be pancytopenic with anemia, leukopenia and thrombocytopenia. Needs bone marrow biopsy for further hematologic work up.  Past Medical History:  Diagnosis Date  . Allergy   . Arthritis   . Hypertension   . Rheumatoid arthritis ([place])   . Seizures ([place])   . Sepsis ([place]) 05/23/2015    Past Surgical History:  Procedure Laterality Date  . back surgery five times  04/2016  . CARPAL TUNNEL RELEASE    . HERNIA REPAIR    . JOINT REPLACEMENT    . knee replacement right    . right hip replacement    . rotator cuff surgery    . SPINE SURGERY      Allergies: Cefoperazone, Penicillins, and Latex  Medications: Prior to Admission medications   Medication Sig Start Date End Date Taking? Authorizing Provider  aspirin 81 MG chewable tablet Chew 81 mg by mouth daily.   Yes [provider]  Cinnamon 500 MG capsule Take 500 mg by mouth 2 (two) times daily.   Yes [provider]  ciprofloxacin (CIPRO) 500 MG tablet Take 500 mg by mouth 2 (two) times daily. 03/27/19  Yes [provider]  clindamycin (CLEOCIN) 300 MG capsule Take 300 mg by mouth 4 (four) times daily. For 14 days 03/27/19 04/10/19 Yes [provider]  etanercept (ENBREL SURECLICK) 50 MG/ML injection Inject 50 mg into the skin every Friday.    Yes [provider]  folic acid (FOLVITE) 1 MG tablet Take 1 mg by mouth daily. 02/25/19  Yes [provider]  gabapentin (NEURONTIN) 300 MG capsule Take 1 capsule (300 mg total) by mouth 4 (four) times daily. QHS 01/05/19 03/31/19  Yes Milinda Pointer, MD  hydroxychloroquine (PLAQUENIL) 200 MG tablet Take 400 mg by mouth at bedtime.   Yes [provider]  lamoTRIgine (LAMICTAL) 200 MG tablet Take 200 mg by mouth 2 (two) times daily.   Yes [provider]  lamoTRIgine (LAMICTAL) 25 MG tablet Take 25 mg by mouth 2 (two) times daily. 04/21/18  Yes [provider]  magnesium oxide (MAG-OX) 400 MG tablet Take 400 mg by mouth daily.   Yes [provider]  methotrexate (RHEUMATREX) 2.5 MG tablet TAKE 10 TABS BY MOUTH ONCE A WEEK 12/13/18  Yes [provider]  nystatin-triamcinolone ointment (MYCOLOG) Apply 1 application topically 2 (two) times daily. 03/09/19  Yes Vaillancourt, Samantha, PA-C  omeprazole (PRILOSEC) 40 MG capsule Take 40 mg by mouth daily.    Yes [provider]  traZODone (DESYREL) 50 MG tablet Take 50 mg by mouth at bedtime.   Yes [provider]  acetaminophen (TYLENOL) 325 MG tablet Take 2 tablets (650 mg total) by mouth every 6 (six) hours as needed for mild pain (or Fever >/= 101). 02/24/18   Gouru, Illene Silver, MD  minocycline (MINOCIN) 100 MG capsule Take 1 capsule (100 mg total) by mouth 2 (two) times daily for 10 days. 04/02/19 04/12/19  Spongberg, Audie Pinto, MD  polyethylene glycol (MIRALAX / Floria Raveling) packet Take 17 g by mouth daily. 02/25/18   Nicholes Mango, MD     Family History  Problem Relation Age of Onset  . Hypertension Other   . Hypertension Mother   . Dementia Mother   . Dementia Father   . Prostate cancer Neg Hx   . Bladder Cancer Neg Hx   . Kidney cancer Neg Hx     Social History   Socioeconomic History  . Marital status: Married    Spouse name: Not on file  . Number of children: Not on file  . Years of education: Not on file  . Highest education level: Not on file  Occupational History  . Not on file  Social Needs  . Financial resource strain: Not on file  . Food insecurity    Worry: Not on file    Inability: Not  on file  . Transportation needs    Medical: Not on file    Non-medical: Not on file  Tobacco Use  . Smoking status: Former Smoker    Packs/day: 1.00    Years: 20.00    Pack years: 20.00    Types: Cigarettes  . Smokeless tobacco: Never Used  Substance and Sexual Activity  . Alcohol use: No  . Drug use: Not on file  . Sexual activity: Not on file  Lifestyle  . Physical activity    Days per week: Not on file    Minutes per session: Not on file  . Stress: Not on file  Relationships  . Social Herbalist on phone: Not on file    Gets together: Not on file    Attends religious service: Not on file    Active member of club or organization: Not on file    Attends meetings of clubs or organizations: Not on file    Relationship status: Not on file  Other Topics Concern  . Not on file  Social History Narrative  . Not on file    ECOG Status: 3 - Symptomatic, >50% confined to bed  Review of Systems: A 12 point ROS discussed and pertinent positives are indicated in the HPI above.  All other systems are negative.  Review of Systems  Respiratory: Negative.   Cardiovascular: Negative.   Gastrointestinal: Negative.   Skin: Positive for rash.  Neurological:       Confused.    Vital Signs: BP 112/62 (BP Location: Right Arm)   Pulse 96   Temp 98.8 F (37.1 C) (Oral)   Resp 20   Ht 6' (1.829 m)   Wt 118.2 kg   SpO2 98%   BMI 35.34 kg/m   Physical Exam Constitutional:      General: He is not in acute distress. Cardiovascular:     Rate and Rhythm: Normal rate and regular rhythm.     Heart sounds: Normal heart sounds. No murmur. No gallop.   Pulmonary:     Effort: Pulmonary effort is normal. No respiratory distress.     Breath sounds: Normal breath sounds. No stridor. No wheezing, rhonchi or rales.  Abdominal:     Palpations: Abdomen is soft.     Tenderness: There is no abdominal tenderness. There is no guarding or rebound.  Musculoskeletal:        General:  Swelling present.  Skin:    Findings: Rash present.  Neurological:     Mental Status: He is disoriented.     Imaging: US Renal  Result Date: 04/01/2019 CLINICAL DATA:  Acute kidney injury EXAM: RENAL / URINARY TRACT ULTRASOUND COMPLETE COMPARISON:  None. FINDINGS: Right Kidney: Renal measurements: 11.1 x 4.3  x 4.5 cm = volume: 110 mL . Echogenicity within normal limits. No mass or hydronephrosis visualized. Left Kidney: Renal measurements: 11.3 x 5.6 x 4.7 cm = volume: 155 mL. Echogenicity within normal limits. There is a thin walled anechoic simple cyst at the mid to inferior pole the left kidney measuring 2.1 x 1.6 x 2.2 cm. No solid mass or hydronephrosis visualized. Bladder: Decompressed with Foley catheter in place. Other: None. IMPRESSION: Negative for obstructive uropathy. Electronically Signed   By: Davina Poke M.D.   On: 04/01/2019 10:11    Labs:  CBC: Recent Labs    04/06/19 0550 04/06/19 1041 04/07/19 0437 04/08/19 0441 04/09/19 0616  WBC 1.7*  --  2.3* 1.6* 1.5*  HGB 9.0*  --  8.6* 9.2* 9.3*  HCT 27.8*  --  27.6* 29.7* 28.7*  PLT PLATELET CLUMPS NOTED ON SMEAR, COUNT APPEARS ADEQUATE 60* 55* 51* 33*    COAGS: No results for input(s): INR, APTT in the last 8760 hours.  BMP: Recent Labs    04/05/19 0826 04/06/19 0550 04/09/19 0616 04/10/19 0701  NA 144 148* 149* 145  K 3.7 3.8 4.2 3.5  CL 109 110 112* 110  CO2 _0 GLUCOSE 105* 99 93 85  BUN 24* _1 CALCIUM 8.5* 8.9 9.0 8.6*  CREATININE 0.91 0.94 0.98 1.01  GFRNONAA >60 >60 >60 >60  GFRAA >60 >60 >60 >60    LIVER FUNCTION TESTS: Recent Labs    04/01/19 0536  BILITOT 0.7  AST 37  ALT 29  ALKPHOS 66  PROT 5.4*  ALBUMIN 2.8*    TUMOR MARKERS: No results for input(s): AFPTM, CEA, CA199, CHROMGRNA in the last 8760 hours.  Assessment and Plan:  For CT guided bone marrow biopsy today. Risks and benefits of bone marrow biopsy was discussed with the patient's son, but not  limited to bleeding, infection, damage to adjacent structures or low yield requiring additional tests.  All of the questions were answered and there is agreement to proceed.  Consent obtained from son by phone and in chart.   Thank you for this interesting consult.  I greatly enjoyed meeting Jeremiah [name]. and look forward to participating in their care.  A copy of this report was sent to the requesting provider on this date.  Electronically Signed: Azzie Roup, MD 04/10/2019, 8:38 AM   I spent a total of 20 Minutes  in face to face in clinical consultation, greater than 50% of which was counseling/coordinating care for bone marrow biopsy.

## 2019-04-10 NOTE — Sedation Documentation (Signed)
Report called to [facility] primary nurse fentanyl 25 mcg versed 1 mg IV for moderate sedation

## 2019-04-10 NOTE — Progress Notes (Signed)
PROGRESS NOTE    Jeremiah [name].  OIT:[id number] DOB: [birth year] DOA: 03/31/2019 PCP: Derinda Late, MD    Brief Narrative:  Jeremiah [name]. is a 78 y.o. male with Past medical history of rheumatoid arthritis on immunosuppressive therapy, seizures, HTN, chronic lymphedema. Patient presented with complaints of worsening leg infection. Patient was seen on 03/26/2019 in the ED at [facility] for cellulitis of the leg.  Patient was discharged on oral Cipro and clindamycin. Patient was taking all his medication up to this many days and was compliant with it. There was an area of infection and redness which was marked. Since last 2 days patient's pain was worsening and that area of redness across the marked line and therefore patient decided to come to the hospital. No nausea no vomiting no fever no chills no chest pain abdominal pain no diarrhea no constipation. Patient remains compliant with all his medication. Continue to take all his immunosuppressive medication throughout this infection course.  ED Course: Presents with above complaint.  And referred for admission for failure for outpatient therapy.  At his baseline ambulates with assistance independent for most of his ADL;  manages his medication on his own.   Interim History: -11/20 CT guided bone marrow aspiration and biopsy  - pancytopenia CBC Latest Ref Rng & Units 04/10/2019 04/09/2019 04/08/2019  WBC 4.0 - 10.5 K/uL 1.5(L) 1.5(L) 1.6(L)  Hemoglobin 13.0 - 17.0 g/dL 9.7(L) 9.3(L) 9.2(L)  Hematocrit 39.0 - 52.0 % 31.0(L) 28.7(L) 29.7(L)  Platelets 150 - 400 K/uL 23(LL) 33(L) 51(L)    Subjective: Has bilateral leg and foot edema, lymphedema and mild tenterness. No CP, AP, fever or chills.  No nausea, vomiting, diarrhea or abdominal pain.   Assessment & Plan:   Principal Problem:   Cellulitis of lower extremity Active Problems:   Rheumatoid arthritis (HCC)   Seizure (HCC)   Pressure injury of skin   Other  pancytopenia (HCC)   Folic acid deficiency   AKI (acute kidney injury) ([place])  Cellulitis of lower extremity superimposed on venous stasis changes associated lymphedema: blood culture no grow so far. Pt developed severe pancytopenia. ID was consulted 11/19. Dr. Delaine Lame recommended to restart antibiotics if he develops fever 100.4 due to Neutropenia -Doxycycline stopped 11/19 -f/u Bx  Anti-infectives (From admission, onward)   Start     Dose/Rate Route Frequency Ordered Stop   04/03/19 1230  doxycycline (VIBRA-TABS) tablet 100 mg  Status:  Discontinued     100 mg Oral Every 12 hours 04/03/19 1224 04/09/19 1547   04/02/19 1500  vancomycin (VANCOCIN) 1,250 mg in sodium chloride 0.9 % 250 mL IVPB  Status:  Discontinued     1,250 mg 166.7 mL/hr over 90 Minutes Intravenous Every 48 hours 03/31/19 1902 04/01/19 0844   04/02/19 0800  vancomycin (VANCOCIN) 1,250 mg in sodium chloride 0.9 % 250 mL IVPB  Status:  Discontinued     1,250 mg 166.7 mL/hr over 90 Minutes Intravenous Every 36 hours 04/01/19 0844 04/03/19 1224   04/02/19 0000  minocycline (MINOCIN) 100 MG capsule     100 mg Oral 2 times daily 04/02/19 1254 04/12/19 2359   03/31/19 1830  vancomycin (VANCOCIN) IVPB 1000 mg/200 mL premix     1,000 mg 200 mL/hr over 60 Minutes Intravenous  Once 03/31/19 1819 03/31/19 2209   03/31/19 1445  vancomycin (VANCOCIN) IVPB 1000 mg/200 mL premix     1,000 mg 200 mL/hr over 60 Minutes Intravenous  Once 03/31/19 1436 03/31/19 1643  New pancytopenia with neutropenia: Unclear etiology. ? mtx toxicity.  Heme-onc was consulted, Dr. Mike Gip is on board. 11/20 CT guided bone marrow aspiration and biopsy was done by IR -neutropenic precautions11/15,  -F/up further recs with hem onc -Recheck cbc in AM -on folic acid  CBC Latest Ref Rng & Units 04/10/2019 04/09/2019 04/08/2019  WBC 4.0 - 10.5 K/uL 1.5(L) 1.5(L) 1.6(L)  Hemoglobin 13.0 - 17.0 g/dL 9.7(L) 9.3(L) 9.2(L)  Hematocrit 39.0 - 52.0 %  31.0(L) 28.7(L) 29.7(L)  Platelets 150 - 400 K/uL 23(LL) 33(L) 51(L)    Acute kidney injury: Suspect was likely prerenal in nature,patient received IV albumin, improving,improved,Cre is1.01 -f/u by BMP  Rheumatoid arthritis:continueholding immunosuppressants currently, heme-onc as noted concerned about methotrexate toxicity  Neuropathy:  -Continue gabapentin   Seizure disorder: no evidence of breakthrough seizures -ContinueLamictal -seizure precaution  Pressure ulcers present on admission. These were bilateral buttock as well as mid sacrum: Wound care was consultedappreciate their recommendations. -Continue with silver Hydrofiber, and foam changing every 3 days and as needed for soilage  DVT prophylaxis: SCD Code Status: full Family Communication: not today Disposition Plan: not ready for discharge. Pt need more work up for pancytopenia per oncologist    Consultants:  Infectious disease ID Oncology IR  Procedures: 11/20: CT guided bone marrow aspiration and biopsy  Antimicrobials: Anti-infectives (From admission, onward)   Start     Dose/Rate Route Frequency Ordered Stop   04/03/19 1230  doxycycline (VIBRA-TABS) tablet 100 mg  Status:  Discontinued     100 mg Oral Every 12 hours 04/03/19 1224 04/09/19 1547   04/02/19 1500  vancomycin (VANCOCIN) 1,250 mg in sodium chloride 0.9 % 250 mL IVPB  Status:  Discontinued     1,250 mg 166.7 mL/hr over 90 Minutes Intravenous Every 48 hours 03/31/19 1902 04/01/19 0844   04/02/19 0800  vancomycin (VANCOCIN) 1,250 mg in sodium chloride 0.9 % 250 mL IVPB  Status:  Discontinued     1,250 mg 166.7 mL/hr over 90 Minutes Intravenous Every 36 hours 04/01/19 0844 04/03/19 1224   04/02/19 0000  minocycline (MINOCIN) 100 MG capsule     100 mg Oral 2 times daily 04/02/19 1254 04/12/19 2359   03/31/19 1830  vancomycin (VANCOCIN) IVPB 1000 mg/200 mL premix     1,000 mg 200 mL/hr over 60 Minutes Intravenous  Once 03/31/19 1819  03/31/19 2209   03/31/19 1445  vancomycin (VANCOCIN) IVPB 1000 mg/200 mL premix     1,000 mg 200 mL/hr over 60 Minutes Intravenous  Once 03/31/19 1436 03/31/19 1643         Objective: Vitals:   04/10/19 0930 04/10/19 0942 04/10/19 1000 04/10/19 1421  BP: 110/60 101/65 107/70 109/68  Pulse: 100 98 94 91  Resp: '14 15 14   '$ Temp:  98.6 F (37 C) 98.7 F (37.1 C) 97.7 F (36.5 C)  TempSrc:  Oral Oral Oral  SpO2: 99% 100% 100% 100%  Weight:      Height:        Intake/Output Summary (Last 24 hours) at 04/10/2019 1936 Last data filed at 04/10/2019 1421 Gross per 24 hour  Intake 120 ml  Output 650 ml  Net -530 ml   Filed Weights   04/08/19 0500 04/09/19 0500 04/10/19 0500  Weight: 112.9 kg 112.9 kg 118.2 kg    Examination:  Physical Exam:  General: Not in acute distress HEENT: PERRL, EOMI, no scleral icterus, No JVD or bruit Cardiac: S1/S2, RRR, No murmurs, gallops or rubs Pulm:  No rales,  wheezing, rhonchi or rubs. Abd: Soft, nondistended, nontender, no rebound pain, no organomegaly, BS present Ext: has bilateral leg and foot edema and lymphedema. 1+DP/PT pulse bilaterally Musculoskeletal: No joint deformities, erythema, or stiffness, ROM full Skin: No rashes.  Neuro: Alert and oriented X3, cranial nerves II-XII grossly intact, moves all extremeties normally. Psych: Patient is not psychotic, no suicidal or hemocidal ideation.    Data Reviewed: I have personally reviewed following labs and imaging studies  CBC: Recent Labs  Lab 04/06/19 0550 04/06/19 1041 04/07/19 0437 04/08/19 0441 04/09/19 0616 04/10/19 0701  WBC 1.7*  --  2.3* 1.6* 1.5* 1.5*  NEUTROABS 1.3*  --  1.4* 0.7* 0.4* 0.2*  HGB 9.0*  --  8.6* 9.2* 9.3* 9.7*  HCT 27.8*  --  27.6* 29.7* 28.7* 31.0*  MCV 86.1  --  90.2 90.5 87.2 90.6  PLT PLATELET CLUMPS NOTED ON SMEAR, COUNT APPEARS ADEQUATE 60* 55* 51* 33* 23*   Basic Metabolic Panel: Recent Labs  Lab 04/05/19 0826 04/06/19 0550  04/09/19 0616 04/10/19 0701  NA 144 148* 149* 145  K 3.7 3.8 4.2 3.5  CL 109 110 112* 110  CO2 '28 29 27 25  '$ GLUCOSE 105* 99 93 85  BUN 24* '23 19 18  '$ CREATININE 0.91 0.94 0.98 1.01  CALCIUM 8.5* 8.9 9.0 8.6*   GFR: Estimated Creatinine Clearance: 81.3 mL/min (by C-G formula based on SCr of 1.01 mg/dL). Liver Function Tests: No results for input(s): AST, ALT, ALKPHOS, BILITOT, PROT, ALBUMIN in the last 168 hours. No results for input(s): LIPASE, AMYLASE in the last 168 hours. No results for input(s): AMMONIA in the last 168 hours. Coagulation Profile: No results for input(s): INR, PROTIME in the last 168 hours. Cardiac Enzymes: No results for input(s): CKTOTAL, CKMB, CKMBINDEX, TROPONINI in the last 168 hours. BNP (last 3 results) No results for input(s): PROBNP in the last 8760 hours. HbA1C: No results for input(s): HGBA1C in the last 72 hours. CBG: No results for input(s): GLUCAP in the last 168 hours. Lipid Profile: No results for input(s): CHOL, HDL, LDLCALC, TRIG, CHOLHDL, LDLDIRECT in the last 72 hours. Thyroid Function Tests: No results for input(s): TSH, T4TOTAL, FREET4, T3FREE, THYROIDAB in the last 72 hours. Anemia Panel: Recent Labs    04/08/19 0441  RETICCTPCT 0.5   Sepsis Labs: No results for input(s): PROCALCITON, LATICACIDVEN in the last 168 hours.  Recent Results (from the past 240 hour(s))  Culture, blood (x 2)     Status: None   Collection Time: 04/05/19 11:17 AM   Specimen: BLOOD  Result Value Ref Range Status   Specimen Description BLOOD LEFT ANTECUBITAL  Final   Special Requests   Final    BOTTLES DRAWN AEROBIC AND ANAEROBIC Blood Culture adequate volume   Culture   Final    NO GROWTH 5 DAYS Performed at [facility], [address], [place], [place] [address]    Report Status 04/10/2019 FINAL  Final  Culture, blood (x 2)     Status: None   Collection Time: 04/05/19 11:25 AM   Specimen: BLOOD  Result Value Ref Range Status    Specimen Description BLOOD RIGHT ANTECUBITAL  Final   Special Requests   Final    BOTTLES DRAWN AEROBIC AND ANAEROBIC Blood Culture adequate volume   Culture   Final    NO GROWTH 5 DAYS Performed at [facility], [address], [place], [place] [address]    Report Status 04/10/2019 FINAL  Final  SARS CORONAVIRUS  2 (TAT 6-24 HRS) Nasopharyngeal Nasopharyngeal Swab     Status: None   Collection Time: 04/08/19  2:04 PM   Specimen: Nasopharyngeal Swab  Result Value Ref Range Status   SARS Coronavirus 2 NEGATIVE NEGATIVE Final    Comment: (NOTE) SARS-CoV-2 target nucleic acids are NOT DETECTED. The SARS-CoV-2 RNA is generally detectable in upper and lower respiratory specimens during the acute phase of infection. Negative results do not preclude SARS-CoV-2 infection, do not rule out co-infections with other pathogens, and should not be used as the sole basis for treatment or other patient management decisions. Negative results must be combined with clinical observations, patient history, and epidemiological information. The expected result is Negative. Fact Sheet for Patients: SugarRoll.be Fact Sheet for Healthcare Providers: https://www.woods-mathews.com/ This test is not yet approved or cleared by the [place] FDA and  has been authorized for detection and/or diagnosis of SARS-CoV-2 by FDA under an Emergency Use Authorization (EUA). This EUA will remain  in effect (meaning this test can be used) for the duration of the COVID-19 declaration under Section 56 4(b)(1) of the Act, 21 U.S.C. section 360bbb-3(b)(1), unless the authorization is terminated or revoked sooner. Performed at [facility], [place] [address] [place], [place] [address]      RN Pressure Injury Documentation: Pressure Injury 04/03/19 Sacrum (Active)  04/03/19 1653  Location: Sacrum  Location Orientation:   Staging:   Wound Description (Comments):    Present on Admission: Yes     Pressure Injury Heel Left;Posterior;Right Unstageable - Full thickness tissue loss in which the base of the ulcer is covered by slough (yellow, tan, gray, green or brown) and/or eschar (tan, brown or black) in the wound bed. (Active)     Location: Heel  Location Orientation: Left;Posterior;Right  Staging: Unstageable - Full thickness tissue loss in which the base of the ulcer is covered by slough (yellow, tan, gray, green or brown) and/or eschar (tan, brown or black) in the wound bed.  Wound Description (Comments):   Present on Admission:      Pressure Injury 04/07/19 Thigh Right;Medial Stage II -  Partial thickness loss of dermis presenting as a shallow open ulcer with a red, pink wound bed without slough. (Active)  04/07/19 1309  Location: Thigh  Location Orientation: Right;Medial  Staging: Stage II -  Partial thickness loss of dermis presenting as a shallow open ulcer with a red, pink wound bed without slough.  Wound Description (Comments):   Present on Admission: No       Radiology Studies: Ct Bone Marrow Biopsy & Aspiration  Result Date: 04/10/2019 CLINICAL DATA:  Pancytopenia and need for bone marrow biopsy. EXAM: CT GUIDED BONE MARROW ASPIRATION AND BIOPSY ANESTHESIA/SEDATION: Versed 1.0 mg IV, Fentanyl 50 mcg IV Total Moderate Sedation Time:   18 minutes. The patient's level of consciousness and physiologic status were continuously monitored during the procedure by Radiology nursing. PROCEDURE: The procedure risks, benefits, and alternatives were explained to the patient's son. Questions regarding the procedure were encouraged and answered. The patient's son understands and consents to the procedure. A time out was performed prior to initiating the procedure. The right gluteal region was prepped with chlorhexidine. Sterile gown and sterile gloves were used for the procedure. Local anesthesia was provided with 1% Lidocaine. Under CT guidance, an 11  gauge On Control bone cutting needle was advanced from a posterior approach into the right iliac bone. Needle positioning was confirmed with CT. Initial non heparinized and heparinized aspirate samples were obtained  of bone marrow. Core biopsy was performed via the On Control drill needle. COMPLICATIONS: None FINDINGS: Inspection of initial aspirate did reveal visible particles. Intact core biopsy sample was obtained. IMPRESSION: CT guided bone marrow biopsy of right posterior iliac bone with both aspirate and core samples obtained. Electronically Signed   By: Aletta Edouard M.D.   On: 04/10/2019 11:14        Scheduled Meds:  aspirin  81 mg Oral Daily   Chlorhexidine Gluconate Cloth  6 each Topical Daily   fentaNYL       folic acid  1 mg Oral Daily   gabapentin  300 mg Oral QID   heparin lock flush       lamoTRIgine  200 mg Oral BID   lamoTRIgine  25 mg Oral BID   mouth rinse  15 mL Mouth Rinse BID   metoprolol tartrate  12.5 mg Oral BID   midazolam       pantoprazole  40 mg Oral Daily   polyethylene glycol  17 g Oral Once   sodium chloride flush  3 mL Intravenous Q12H   traZODone  50 mg Oral QHS   Continuous Infusions:  sodium chloride Stopped (04/10/19 0935)     LOS: 9 days    Time spent: 30 min     Ivor Costa, DO [facility] PAGER is on AMION  If 7PM-7AM, please contact night-coverage [email or web address] Password [facility] 04/10/2019, 7:36 PM

## 2019-04-10 NOTE — Progress Notes (Signed)
Temp of 101.2 Febrile neutropenia Stomatitis Bone marrow done today Blood culture Start vanco and meropenem ( has PCN/cehalosorin allergy)

## 2019-04-10 NOTE — Progress Notes (Signed)
ID  Pt with Pancytopenia Lymphedema legs Mthotrexate toxicity Low folate Stomatitis Treated cellulitis legs  Subjective No new complaints Had bone marrow biopsy today No fever Mouth ulcer present  Objective BP 109/68 (BP Location: Left Arm)   Pulse 91   Temp 97.7 F (36.5 C) (Oral)   Resp 14   Ht 6' (1.829 m)   Wt 118.2 kg   SpO2 100%   BMI 35.34 kg/m    Awake, alert, hard of hearing, pale Oral ulcer, ulceration lower lip ( looks improved) Chest b/l air entry Hs; s1s2 Abd soft Legs b/l lymphedema/venous edema with staining and venous pigmentation Compression bandage removed- swelling much improved 04/10/19-today   04/10/19   04/08/19   On admission   CBC Latest Ref Rng & Units 04/10/2019 04/09/2019 04/08/2019  WBC 4.0 - 10.5 K/uL 1.5(L) 1.5(L) 1.6(L)  Hemoglobin 13.0 - 17.0 g/dL 9.7(L) 9.3(L) 9.2(L)  Hematocrit 39.0 - 52.0 % 31.0(L) 28.7(L) 29.7(L)  Platelets 150 - 400 K/uL 23(LL) 33(L) 51(L)   CMP Latest Ref Rng & Units 04/10/2019 04/09/2019 04/06/2019  Glucose 70 - 99 mg/dL 85 93 99  BUN 8 - 23 mg/dL '18 19 23  '$ Creatinine 0.61 - 1.24 mg/dL 1.01 0.98 0.94  Sodium 135 - 145 mmol/L 145 149(H) 148(H)  Potassium 3.5 - 5.1 mmol/L 3.5 4.2 3.8  Chloride 98 - 111 mmol/L 110 112(H) 110  CO2 22 - 32 mmol/L '25 27 29  '$ Calcium 8.9 - 10.3 mg/dL 8.6(L) 9.0 8.9  Total Protein 6.5 - 8.1 g/dL - - -  Total Bilirubin 0.3 - 1.2 mg/dL - - -  Alkaline Phos 38 - 126 U/L - - -  AST 15 - 41 U/L - - -  ALT 0 - 44 U/L - - -     Impression/recommendation  B/l lymphedema/venous edema legs with pigmentation mimicking cellulitis. Pt has been treated adequately for cellulitis since 11/6. Since  this hospitalization  he got 7 days of antibiotics Off antibiotics since 04/09/19.   Pancytopenia- Methotrexate toxicity is the top of D>D with low folate level and also not taking folate for more htan a month As the counts kept dropping he underwent a bone marrow biopsy today to  r/o other causes Parvovirus has been checked by heme/onc Will also check HIV  Neutropenia with ANC < 500.  Restart antibiotics if he develops fever 100.4  Rheumatoid arthritis - all meds 9 MXT, enbrel, plaquenil) on hold  Pressure ulcers ankle and sacrum- none looks overtly infected- observe  Discussed with care team ID will follow remotely this weekend- call if needed

## 2019-04-10 NOTE — Procedures (Signed)
Interventional Radiology Procedure Note  Procedure: CT guided bone marrow aspiration and biopsy  Complications: None  EBL: < 10 mL  Findings: Aspirate and core biopsy performed of bone marrow in right iliac bone.  Plan: Bedrest supine x 1 hrs  Kyi Romanello T. Sharmila Wrobleski, M.D Pager:  [phone]   

## 2019-04-10 NOTE — TOC Progression Note (Signed)
Transition of Care (TOC) - Progression Note    Patient Details  Name: Jeremiah [name]. MRN: [id number] Date of Birth: 03/23/1941  Transition of [facility]) CM/SW Contact  Beverly Sessions, RN Phone Number: 04/10/2019, 3:35 PM  Clinical Narrative:     RNCM notified Magda Paganini at [facility] that patient would be discharging  RNCM notified MD that we would have to get re auth from insurance for patient and for them to please provide 24 hours notice so we can obtain auth.   MD aware that [facility] does not take admission over the weekend  Requested palliative care consult    Expected Discharge Plan: [place]    Expected Discharge Plan and Services Expected Discharge Plan: [place]   Discharge Planning Services: CM Consult   Living arrangements for the past 2 months: Single Family Home Expected Discharge Date: 04/02/19                                     Social Determinants of Health (SDOH) Interventions    Readmission Risk Interventions No flowsheet data found.

## 2019-04-11 ENCOUNTER — Inpatient Hospital Stay: Payer: Medicare Other

## 2019-04-11 DIAGNOSIS — G9341 Metabolic encephalopathy: Secondary | ICD-10-CM | POA: Diagnosis present

## 2019-04-11 DIAGNOSIS — D709 Neutropenia, unspecified: Secondary | ICD-10-CM

## 2019-04-11 DIAGNOSIS — R5081 Fever presenting with conditions classified elsewhere: Secondary | ICD-10-CM

## 2019-04-11 LAB — CBC WITH DIFFERENTIAL/PLATELET
Abs Immature Granulocytes: 0 10*3/uL (ref 0.00–0.07)
Basophils Absolute: 0 10*3/uL (ref 0.0–0.1)
Basophils Relative: 0 %
Eosinophils Absolute: 0.4 10*3/uL (ref 0.0–0.5)
Eosinophils Relative: 24 %
HCT: 28.8 % — ABNORMAL LOW (ref 39.0–52.0)
Hemoglobin: 9.1 g/dL — ABNORMAL LOW (ref 13.0–17.0)
Immature Granulocytes: 0 %
Lymphocytes Relative: 54 %
Lymphs Abs: 0.8 10*3/uL (ref 0.7–4.0)
MCH: 28.3 pg (ref 26.0–34.0)
MCHC: 31.6 g/dL (ref 30.0–36.0)
MCV: 89.4 fL (ref 80.0–100.0)
Monocytes Absolute: 0.2 10*3/uL (ref 0.1–1.0)
Monocytes Relative: 14 %
Neutro Abs: 0.1 10*3/uL — ABNORMAL LOW (ref 1.7–7.7)
Neutrophils Relative %: 8 %
Platelets: 47 10*3/uL — ABNORMAL LOW (ref 150–400)
RBC: 3.22 MIL/uL — ABNORMAL LOW (ref 4.22–5.81)
RDW: 18.2 % — ABNORMAL HIGH (ref 11.5–15.5)
Smear Review: DECREASED
WBC: 1.4 10*3/uL — CL (ref 4.0–10.5)
nRBC: 0 % (ref 0.0–0.2)

## 2019-04-11 LAB — TYPE AND SCREEN
ABO/RH(D): A POS
Antibody Screen: NEGATIVE

## 2019-04-11 LAB — BASIC METABOLIC PANEL
Anion gap: 8 (ref 5–15)
BUN: 17 mg/dL (ref 8–23)
CO2: 25 mmol/L (ref 22–32)
Calcium: 8.2 mg/dL — ABNORMAL LOW (ref 8.9–10.3)
Chloride: 112 mmol/L — ABNORMAL HIGH (ref 98–111)
Creatinine, Ser: 0.99 mg/dL (ref 0.61–1.24)
GFR calc Af Amer: >60 mL/min (ref >60)
GFR calc non Af Amer: >60 mL/min (ref >60)
Glucose, Bld: 95 mg/dL (ref 70–99)
Potassium: 3.4 mmol/L — ABNORMAL LOW (ref 3.5–5.1)
Sodium: 145 mmol/L (ref 135–145)

## 2019-04-11 LAB — CULTURE, BLOOD (ROUTINE X 2)
Culture: NO GROWTH
Culture: NO GROWTH
Special Requests: ADEQUATE
Special Requests: ADEQUATE

## 2019-04-11 LAB — HIV ANTIBODY (ROUTINE TESTING W REFLEX): HIV Screen 4th Generation wRfx: NONREACTIVE

## 2019-04-11 MED ORDER — TBO-FILGRASTIM 300 MCG/0.5ML ~~LOC~~ SOSY
300.0000 ug | PREFILLED_SYRINGE | Freq: Every day | SUBCUTANEOUS | Status: DC
  Administered 2019-04-11 – 2019-04-14 (×4): 300 ug via SUBCUTANEOUS
  Filled 2019-04-11 (×4): qty 0.5

## 2019-04-11 MED ORDER — LORAZEPAM 2 MG/ML IJ SOLN
0.5000 mg | Freq: Once | INTRAMUSCULAR | Status: AC
  Administered 2019-04-11: 0.5 mg via INTRAVENOUS
  Filled 2019-04-11: qty 1

## 2019-04-11 MED ORDER — POTASSIUM CHLORIDE CRYS ER 20 MEQ PO TBCR
40.0000 meq | EXTENDED_RELEASE_TABLET | Freq: Once | ORAL | Status: AC
  Administered 2019-04-11: 40 meq via ORAL
  Filled 2019-04-11: qty 2

## 2019-04-11 MED ORDER — VANCOMYCIN HCL 10 G IV SOLR
1750.0000 mg | INTRAVENOUS | Status: DC
  Administered 2019-04-12 – 2019-04-14 (×3): 1750 mg via INTRAVENOUS
  Filled 2019-04-11 (×5): qty 1750

## 2019-04-11 MED ORDER — LORATADINE 10 MG PO TABS
10.0000 mg | ORAL_TABLET | Freq: Every day | ORAL | Status: DC
  Administered 2019-04-11 – 2019-04-19 (×9): 10 mg via ORAL
  Filled 2019-04-11 (×9): qty 1

## 2019-04-11 NOTE — Progress Notes (Addendum)
PROGRESS NOTE    Jeremiah [name].  LXB:[id number] DOB: 1940/08/11 DOA: 03/31/2019 PCP: Derinda Late, MD    Brief Narrative:  Jeremiah [name]. is a 78 y.o. male with Past medical history of rheumatoid arthritis on immunosuppressive therapy, seizures, HTN, chronic lymphedema. Patient presented with complaints of worsening leg infection. Patient was seen on 03/26/2019 in the ED at [facility] for cellulitis of the leg.  Patient was discharged on oral Cipro and clindamycin. Patient was taking all his medication up to this many days and was compliant with it. There was an area of infection and redness which was marked. Since last 2 days patient's pain was worsening and that area of redness across the marked line and therefore patient decided to come to the hospital. No nausea no vomiting no fever no chills no chest pain abdominal pain no diarrhea no constipation. Patient remains compliant with all his medication. Continue to take all his immunosuppressive medication throughout this infection course.  ED Course: Presents with above complaint.  And referred for admission for failure for outpatient therapy.  At his baseline ambulates with assistance independent for most of his ADL;  manages his medication on his own.  Interim History: -11/20 CT guided bone marrow aspiration and biopsy - 11/20: due to fever with neutropenia, Dr. Steva Ready of ID started patient with vancomycin and meropenem, pending Bx and Ux -11/20: Tbo-Filgrastim (GRANIX) injection 300 mcg was given  - pancytopenia, platelet improved:  CBC Latest Ref Rng & Units 04/11/2019 04/10/2019 04/09/2019  WBC 4.0 - 10.5 K/uL 1.4(LL) 1.5(L) 1.5(L)  Hemoglobin 13.0 - 17.0 g/dL 9.1(L) 9.7(L) 9.3(L)  Hematocrit 39.0 - 52.0 % 28.8(L) 31.0(L) 28.7(L)  Platelets 150 - 400 K/uL 47(L) 23(LL) 33(L)    Subjective: Has bilateral leg and foot edema, lymphedema and mild leg tenterness. No CP, AP, fever or chills.  No nausea, vomiting,  diarrhea or abdominal pain.   Assessment & Plan:   Principal Problem:   Neutropenia with fever (HCC) Active Problems:   Rheumatoid arthritis (HCC)   Seizure (HCC)   Pressure injury of skin   Other pancytopenia (HCC)   Folic acid deficiency   Cellulitis of lower extremity   AKI (acute kidney injury) ([place])   Pancytopenia (HCC)   Acute metabolic encephalopathy  Neutropenia with fever:  Dr. Delaine Lame of infectious disease was consulted.  Started the patient with vancomycin and meropenem 11/20 -Vancomycin and meropenem -Follow-up blood culture and urine culture -Neutropenic precaution  Acute metabolic encephalopathy: Patient has been intermittently confused, etiology is not clear.  Differential diagnoses include delirium.  -Will get CT head to make sure pt does not have intracranial bleeding given thrombocytopenia -Frequent neuro check  Cellulitis of lower extremity superimposed on venous stasis changes associated lymphedema: blood culture no grow so far. Pt developed severe pancytopenia. ID was consulted 11/19.  -Doxycycline stopped 11/19 -f/u Bx  New pancytopenia with neutropenia: Unclear etiology. ? mtx toxicity.  Heme-onc was consulted, oncology is on board, highly appreciated. 11/20 CT guided bone marrow aspiration and biopsy was done by IR. Platelet slightly improved. -neutropenic [id number],  -on folic acid -iron study was ordered by oncology -Tbo-Filgrastim [facility]) injection 300 mcg was given by oncology  CBC Latest Ref Rng & Units 04/11/2019 04/10/2019 04/09/2019  WBC 4.0 - 10.5 K/uL 1.4(LL) 1.5(L) 1.5(L)  Hemoglobin 13.0 - 17.0 g/dL 9.1(L) 9.7(L) 9.3(L)  Hematocrit 39.0 - 52.0 % 28.8(L) 31.0(L) 28.7(L)  Platelets 150 - 400 K/uL 47(L) 23(LL) 33(L)    Acute kidney  injury: Suspect was likely prerenal in nature,patient received IV albumin, improving,improved,Cre is 0.99 -f/u by BMP  Rheumatoid arthritis:continueholding immunosuppressants currently,  heme-onc as noted concerned about methotrexate toxicity  Neuropathy:  -Continue gabapentin   Seizure disorder: no evidence of breakthrough seizures -ContinueLamictal -seizure precaution  Pressure ulcers present on admission. These were bilateral buttock as well as mid sacrum: Wound care was consultedappreciate their recommendations. -Continue with silver Hydrofiber, and foam changing every 3 days and as needed for soilage  DVT prophylaxis: SCD Code Status: full Family Communication: wife at bedside Disposition Plan: not ready for discharge. Ppient remains severely deconditioned/ debilitated. has neutropenia fever  Consultants:  Infectious disease ID Oncology IR  Procedures: 11/20: CT guided bone marrow aspiration and biopsy  Antimicrobials: Anti-infectives (From admission, onward)   Start     Dose/Rate Route Frequency Ordered Stop   04/12/19 0000  vancomycin (VANCOCIN) 1,750 mg in sodium chloride 0.9 % 500 mL IVPB     1,750 mg 250 mL/hr over 120 Minutes Intravenous Every 24 hours 04/11/19 0508     04/11/19 0000  vancomycin (VANCOCIN) 2,000 mg in sodium chloride 0.9 % 500 mL IVPB     2,000 mg 250 mL/hr over 120 Minutes Intravenous  Once 04/10/19 2346 04/11/19 0228   04/11/19 0000  meropenem (MERREM) 1 g in sodium chloride 0.9 % 100 mL IVPB     1 g 200 mL/hr over 30 Minutes Intravenous Every 8 hours 04/10/19 2346     04/03/19 1230  doxycycline (VIBRA-TABS) tablet 100 mg  Status:  Discontinued     100 mg Oral Every 12 hours 04/03/19 1224 04/09/19 1547   04/02/19 1500  vancomycin (VANCOCIN) 1,250 mg in sodium chloride 0.9 % 250 mL IVPB  Status:  Discontinued     1,250 mg 166.7 mL/hr over 90 Minutes Intravenous Every 48 hours 03/31/19 1902 04/01/19 0844   04/02/19 0800  vancomycin (VANCOCIN) 1,250 mg in sodium chloride 0.9 % 250 mL IVPB  Status:  Discontinued     1,250 mg 166.7 mL/hr over 90 Minutes Intravenous Every 36 hours 04/01/19 0844 04/03/19 1224   04/02/19 0000   minocycline (MINOCIN) 100 MG capsule     100 mg Oral 2 times daily 04/02/19 1254 04/12/19 2359   03/31/19 1830  vancomycin (VANCOCIN) IVPB 1000 mg/200 mL premix     1,000 mg 200 mL/hr over 60 Minutes Intravenous  Once 03/31/19 1819 03/31/19 2209   03/31/19 1445  vancomycin (VANCOCIN) IVPB 1000 mg/200 mL premix     1,000 mg 200 mL/hr over 60 Minutes Intravenous  Once 03/31/19 1436 03/31/19 1643         Objective: Vitals:   04/10/19 1956 04/10/19 2146 04/11/19 0629 04/11/19 1434  BP:   132/71 119/66  Pulse: (!) 108  (!) 110 88  Resp:   18 18  Temp:  99.9 F (37.7 C) 98.7 F (37.1 C) 99.2 F (37.3 C)  TempSrc:  Axillary Axillary Oral  SpO2: 98%  98% 98%  Weight:      Height:        Intake/Output Summary (Last 24 hours) at 04/11/2019 1658 Last data filed at 04/11/2019 1643 Gross per 24 hour  Intake 884.35 ml  Output 950 ml  Net -65.65 ml   Filed Weights   04/08/19 0500 04/09/19 0500 04/10/19 0500  Weight: 112.9 kg 112.9 kg 118.2 kg    Examination:  Physical Exam:  General: Not in acute distress HEENT: PERRL, EOMI, no scleral icterus, No JVD or bruit Cardiac:  S1/S2, RRR, No murmurs, gallops or rubs Pulm:  No rales, wheezing, rhonchi or rubs. Abd: Soft, nondistended, nontender, no rebound pain, no organomegaly, BS present Ext: has bilateral leg and foot edema and lymphedema. 1+DP/PT pulse bilaterally Musculoskeletal: No joint deformities, erythema, or stiffness, ROM full Skin: No rashes.  Neuro: Alert and oriented X3, cranial nerves II-XII grossly intact, moves all extremeties normally. Psych: Patient is not psychotic, no suicidal or hemocidal ideation.    Data Reviewed: I have personally reviewed following labs and imaging studies  CBC: Recent Labs  Lab 04/07/19 0437 04/08/19 0441 04/09/19 0616 04/10/19 0701 04/11/19 0842  WBC 2.3* 1.6* 1.5* 1.5* 1.4*  NEUTROABS 1.4* 0.7* 0.4* 0.2* 0.1*  HGB 8.6* 9.2* 9.3* 9.7* 9.1*  HCT 27.6* 29.7* 28.7* 31.0*  28.8*  MCV 90.2 90.5 87.2 90.6 89.4  PLT 55* 51* 33* 23* 47*   Basic Metabolic Panel: Recent Labs  Lab 04/05/19 0826 04/06/19 0550 04/09/19 0616 04/10/19 0701 04/11/19 0842  NA 144 148* 149* 145 145  K 3.7 3.8 4.2 3.5 3.4*  CL 109 110 112* 110 112*  CO2 '28 29 27 25 25  '$ GLUCOSE 105* 99 93 85 95  BUN 24* '23 19 18 17  '$ CREATININE 0.91 0.94 0.98 1.01 0.99  CALCIUM 8.5* 8.9 9.0 8.6* 8.2*   GFR: Estimated Creatinine Clearance: 82.9 mL/min (by C-G formula based on SCr of 0.99 mg/dL). Liver Function Tests: No results for input(s): AST, ALT, ALKPHOS, BILITOT, PROT, ALBUMIN in the last 168 hours. No results for input(s): LIPASE, AMYLASE in the last 168 hours. No results for input(s): AMMONIA in the last 168 hours. Coagulation Profile: No results for input(s): INR, PROTIME in the last 168 hours. Cardiac Enzymes: No results for input(s): CKTOTAL, CKMB, CKMBINDEX, TROPONINI in the last 168 hours. BNP (last 3 results) No results for input(s): PROBNP in the last 8760 hours. HbA1C: No results for input(s): HGBA1C in the last 72 hours. CBG: No results for input(s): GLUCAP in the last 168 hours. Lipid Profile: No results for input(s): CHOL, HDL, LDLCALC, TRIG, CHOLHDL, LDLDIRECT in the last 72 hours. Thyroid Function Tests: No results for input(s): TSH, T4TOTAL, FREET4, T3FREE, THYROIDAB in the last 72 hours. Anemia Panel: No results for input(s): VITAMINB12, FOLATE, FERRITIN, TIBC, IRON, RETICCTPCT in the last 72 hours. Sepsis Labs: No results for input(s): PROCALCITON, LATICACIDVEN in the last 168 hours.  Recent Results (from the past 240 hour(s))  Culture, blood (x 2)     Status: None   Collection Time: 04/05/19 11:17 AM   Specimen: BLOOD  Result Value Ref Range Status   Specimen Description BLOOD LEFT ANTECUBITAL  Final   Special Requests   Final    BOTTLES DRAWN AEROBIC AND ANAEROBIC Blood Culture adequate volume   Culture   Final    NO GROWTH 5 DAYS Performed at [facility], [address], [place], [place] [address]    Report Status 04/10/2019 FINAL  Final  Culture, blood (x 2)     Status: None   Collection Time: 04/05/19 11:25 AM   Specimen: BLOOD  Result Value Ref Range Status   Specimen Description BLOOD RIGHT ANTECUBITAL  Final   Special Requests   Final    BOTTLES DRAWN AEROBIC AND ANAEROBIC Blood Culture adequate volume   Culture   Final    NO GROWTH 5 DAYS Performed at [facility], [address], [place], [place] [address]    Report Status 04/10/2019 FINAL  Final  SARS CORONAVIRUS 2 (  TAT 6-24 HRS) Nasopharyngeal Nasopharyngeal Swab     Status: None   Collection Time: 04/08/19  2:04 PM   Specimen: Nasopharyngeal Swab  Result Value Ref Range Status   SARS Coronavirus 2 NEGATIVE NEGATIVE Final    Comment: (NOTE) SARS-CoV-2 target nucleic acids are NOT DETECTED. The SARS-CoV-2 RNA is generally detectable in upper and lower respiratory specimens during the acute phase of infection. Negative results do not preclude SARS-CoV-2 infection, do not rule out co-infections with other pathogens, and should not be used as the sole basis for treatment or other patient management decisions. Negative results must be combined with clinical observations, patient history, and epidemiological information. The expected result is Negative. Fact Sheet for Patients: SugarRoll.be Fact Sheet for Healthcare Providers: https://www.woods-mathews.com/ This test is not yet approved or cleared by the [place] FDA and  has been authorized for detection and/or diagnosis of SARS-CoV-2 by FDA under an Emergency Use Authorization (EUA). This EUA will remain  in effect (meaning this test can be used) for the duration of the COVID-19 declaration under Section 56 4(b)(1) of the Act, 21 U.S.C. section 360bbb-3(b)(1), unless the authorization is terminated or revoked sooner. Performed at [facility], [place] [address]ina [address] [place], [place] [address]   CULTURE, BLOOD (ROUTINE X 2) w Reflex to ID Panel     Status: None (Preliminary result)   Collection Time: 04/10/19 11:55 PM   Specimen: BLOOD  Result Value Ref Range Status   Specimen Description BLOOD RIGHT ANTECUBITAL  Final   Special Requests   Final    BOTTLES DRAWN AEROBIC AND ANAEROBIC Blood Culture adequate volume   Culture   Final    NO GROWTH < 12 HOURS Performed at [facility], [address] [place], [place] [address]    Report Status PENDING  Incomplete  CULTURE, BLOOD (ROUTINE X 2) w Reflex to ID Panel     Status: None (Preliminary result)   Collection Time: 04/10/19 11:59 PM   Specimen: BLOOD  Result Value Ref Range Status   Specimen Description BLOOD RIGHT ARM  Final   Special Requests   Final    BOTTLES DRAWN AEROBIC AND ANAEROBIC Blood Culture adequate volume   Culture   Final    NO GROWTH < 12 HOURS Performed at [facility], [address], [place], [place] [address]    Report Status PENDING  Incomplete     RN Pressure Injury Documentation: Pressure Injury 04/03/19 Sacrum (Active)  04/03/19 1653  Location: Sacrum  Location Orientation:   Staging:   Wound Description (Comments):   Present on Admission: Yes     Pressure Injury Heel Left;Posterior;Right Unstageable - Full thickness tissue loss in which the base of the ulcer is covered by slough (yellow, tan, gray, green or brown) and/or eschar (tan, brown or black) in the wound bed. (Active)     Location: Heel  Location Orientation: Left;Posterior;Right  Staging: Unstageable - Full thickness tissue loss in which the base of the ulcer is covered by slough (yellow, tan, gray, green or brown) and/or eschar (tan, brown or black) in the wound bed.  Wound Description (Comments):   Present on Admission:      Pressure Injury 04/07/19 Thigh Right;Medial Stage II -  Partial thickness loss of dermis presenting as a shallow open ulcer with a red,  pink wound bed without slough. (Active)  04/07/19 1309  Location: Thigh  Location Orientation: Right;Medial  Staging: Stage II -  Partial thickness loss of dermis presenting  as a shallow open ulcer with a red, pink wound bed without slough.  Wound Description (Comments):   Present on Admission: No       Radiology Studies: Dg Chest Port 1 View  Result Date: 04/11/2019 CLINICAL DATA:  Fever. EXAM: PORTABLE CHEST 1 VIEW COMPARISON:  Radiograph 02/20/2018 FINDINGS: Lung volumes are low.The cardiomediastinal contours are normal. Mild streaky left lung base atelectasis. Pulmonary vasculature is normal. No consolidation, pleural effusion, or pneumothorax. No acute osseous abnormalities are seen. Chronic changes about the shoulders. Remote left rib fracture. IMPRESSION: Low lung volumes with mild left basilar atelectasis. No consolidation to suggest pneumonia. Electronically Signed   By: Keith Rake M.D.   On: 04/11/2019 00:44   Ct Bone Marrow Biopsy & Aspiration  Result Date: 04/10/2019 CLINICAL DATA:  Pancytopenia and need for bone marrow biopsy. EXAM: CT GUIDED BONE MARROW ASPIRATION AND BIOPSY ANESTHESIA/SEDATION: Versed 1.0 mg IV, Fentanyl 50 mcg IV Total Moderate Sedation Time:   18 minutes. The patient's level of consciousness and physiologic status were continuously monitored during the procedure by Radiology nursing. PROCEDURE: The procedure risks, benefits, and alternatives were explained to the patient's son. Questions regarding the procedure were encouraged and answered. The patient's son understands and consents to the procedure. A time out was performed prior to initiating the procedure. The right gluteal region was prepped with chlorhexidine. Sterile gown and sterile gloves were used for the procedure. Local anesthesia was provided with 1% Lidocaine. Under CT guidance, an 11 gauge On Control bone cutting needle was advanced from a posterior approach into the right iliac bone. Needle  positioning was confirmed with CT. Initial non heparinized and heparinized aspirate samples were obtained of bone marrow. Core biopsy was performed via the On Control drill needle. COMPLICATIONS: None FINDINGS: Inspection of initial aspirate did reveal visible particles. Intact core biopsy sample was obtained. IMPRESSION: CT guided bone marrow biopsy of right posterior iliac bone with both aspirate and core samples obtained. Electronically Signed   By: Aletta Edouard M.D.   On: 04/10/2019 11:14        Scheduled Meds:  aspirin  81 mg Oral Daily   Chlorhexidine Gluconate Cloth  6 each Topical Daily   folic acid  1 mg Oral Daily   gabapentin  300 mg Oral QID   lamoTRIgine  200 mg Oral BID   lamoTRIgine  25 mg Oral BID   loratadine  10 mg Oral Daily   mouth rinse  15 mL Mouth Rinse BID   metoprolol tartrate  12.5 mg Oral BID   pantoprazole  40 mg Oral Daily   polyethylene glycol  17 g Oral Once   sodium chloride flush  3 mL Intravenous Q12H   Tbo-Filgrastim  300 mcg Subcutaneous Daily   traZODone  50 mg Oral QHS   Continuous Infusions:  sodium chloride 10 mL/hr at 04/11/19 1643   meropenem (MERREM) IV Stopped (04/11/19 1356)   [START ON 04/12/2019] vancomycin       LOS: 10 days    Time spent: 30 min     Ivor Costa, DO [facility] PAGER is on AMION  If 7PM-7AM, please contact night-coverage [email or web address] Password [facility] 04/11/2019, 4:58 PM

## 2019-04-11 NOTE — Progress Notes (Signed)
Prime doc notified pt agitated, combative, punching staff, orders given

## 2019-04-11 NOTE — Progress Notes (Signed)
MD notified: Lab called me for a critical result WBC 1.4, platelets are better than yesterday it is 47.

## 2019-04-11 NOTE — Progress Notes (Signed)
Rufina Falco notified pt agitated , combative, punching staff, orders given

## 2019-04-11 NOTE — Progress Notes (Signed)
Pt refused to have scrotum dressing changed, physically aggressive, attempted to punch staff.

## 2019-04-11 NOTE — Progress Notes (Signed)
MD notified: Phlebotomy could not obtain labs this morning as the patient became aggressive and started to punch phlebotomy when sticking the patient to obtain blood.

## 2019-04-11 NOTE — Progress Notes (Signed)
PT Cancellation Note  Patient Details Name: Jeremiah [name]. MRN: [id number] DOB: 06/27/1940   Cancelled Treatment:    Reason Eval/Treat Not Completed: Fatigue/lethargy limiting ability to participate   Pt combative last night and had received atavan per tech.  Lethargic today.  Will hold session and continue as appropriate.   Chesley Noon 04/11/2019, 11:20 AM

## 2019-04-11 NOTE — Progress Notes (Signed)
Pharmacy Antibiotic Note  Jeremiah [name]. is a 78 y.o. male admitted on 03/31/2019 with febrile neutropenia.  Pharmacy has been consulted for vanc/meropenem (patient has anaphylaxis to PCN and cephalosporins) dosing. Patient received vanc 2g IV load and meropenem 1g IV x 1  Plan: Vancomycin 1750 mg IV Q 24 hrs. Goal AUC 400-550. Expected AUC: 491.8 SCr used: 1.01 Cssmin: 10.3  Will continue meropenem 1g IV q8h per CrCl > 60 ml/min and will continue to monitor s/sx of infx and adjust doses as necessary if renal function changes.  Height: 6' (182.9 cm) Weight: 260 lb 9.3 oz (118.2 kg) IBW/kg (Calculated) : 77.6  Temp (24hrs), Avg:99.2 F (37.3 C), Min:97.7 F (36.5 C), Max:101.3 F (38.5 C)  Recent Labs  Lab 04/05/19 0826 04/06/19 0550 04/07/19 0437 04/08/19 0441 04/09/19 0616 04/10/19 0701  WBC 1.0* 1.7* 2.3* 1.6* 1.5* 1.5*  CREATININE 0.91 0.94  --   --  0.98 1.01    Estimated Creatinine Clearance: 81.3 mL/min (by C-G formula based on SCr of 1.01 mg/dL).    Allergies  Allergen Reactions  . Cefoperazone Anaphylaxis  . Penicillins Anaphylaxis and Other (See Comments)    Has patient had a PCN reaction causing immediate rash, facial/tongue/throat swelling, SOB or lightheadedness with hypotension: Yes Has patient had a PCN reaction causing severe rash involving mucus membranes or skin necrosis: No Has patient had a PCN reaction that required hospitalization No Has patient had a PCN reaction occurring within the last 10 years: No If all of the above answers are "NO", then may proceed with Cephalosporin use.  . Latex Rash    Thank you for allowing pharmacy to be a part of this patient's care.  Tobie Lords, PharmD, BCPS Clinical Pharmacist 04/11/2019 5:13 AM

## 2019-04-11 NOTE — Progress Notes (Signed)
Patient has generalized itching and medication has been order by hospitalist

## 2019-04-11 NOTE — Progress Notes (Signed)
MD notified: will this patient get anythign for itching and lastly the daughter in law Alyse Low wanted to know if you could call her at [phone] to talk to her about the bone marrow results.

## 2019-04-11 NOTE — Progress Notes (Signed)
MD notified: Dr. Blaine Hamper the patient's wife is in the room and wanted to see if you would be able to see her in the room as she has questions about the fever over the night, antibiotics and CHF. If you are not able to see her in the room she wants to know if you could speak with Jeremiah [name] at [phone] who she gives permission for you to inform her about this questions as she has trouble hearing over the phone.

## 2019-04-11 NOTE — Progress Notes (Addendum)
Hematology/Oncology Consult note [facility]  Telephone:([phone] [phone]  Patient Care Team: Derinda Late, MD as PCP - General (Family Medicine)   Name of the patient: Jeremiah [name]  [id number]  [birth year]   Date of visit: 04/11/2019    Interval history- he is lethargic but does arouse and respond to verbal commads  ECOG PS- 4   Review of systems- Review of Systems  Unable to perform ROS: Acuity of condition       Allergies  Allergen Reactions  . Cefoperazone Anaphylaxis  . Penicillins Anaphylaxis and Other (See Comments)    Has patient had a PCN reaction causing immediate rash, facial/tongue/throat swelling, SOB or lightheadedness with hypotension: Yes Has patient had a PCN reaction causing severe rash involving mucus membranes or skin necrosis: No Has patient had a PCN reaction that required hospitalization No Has patient had a PCN reaction occurring within the last 10 years: No If all of the above answers are "NO", then may proceed with Cephalosporin use.  . Latex Rash     Past Medical History:  Diagnosis Date  . Allergy   . Arthritis   . Hypertension   . Rheumatoid arthritis ([place])   . Seizures ([place])   . Sepsis ([place]) 05/23/2015     Past Surgical History:  Procedure Laterality Date  . back surgery five times  04/2016  . CARPAL TUNNEL RELEASE    . HERNIA REPAIR    . JOINT REPLACEMENT    . knee replacement right    . right hip replacement    . rotator cuff surgery    . SPINE SURGERY      Social History   Socioeconomic History  . Marital status: Married    Spouse name: Not on file  . Number of children: Not on file  . Years of education: Not on file  . Highest education level: Not on file  Occupational History  . Not on file  Social Needs  . Financial resource strain: Not on file  . Food insecurity    Worry: Not on file    Inability: Not on file  . Transportation needs    Medical: Not on file   Non-medical: Not on file  Tobacco Use  . Smoking status: Former Smoker    Packs/day: 1.00    Years: 20.00    Pack years: 20.00    Types: Cigarettes  . Smokeless tobacco: Never Used  Substance and Sexual Activity  . Alcohol use: No  . Drug use: Not on file  . Sexual activity: Not on file  Lifestyle  . Physical activity    Days per week: Not on file    Minutes per session: Not on file  . Stress: Not on file  Relationships  . Social Herbalist on phone: Not on file    Gets together: Not on file    Attends religious service: Not on file    Active member of club or organization: Not on file    Attends meetings of clubs or organizations: Not on file    Relationship status: Not on file  . Intimate partner violence    Fear of current or ex partner: Not on file    Emotionally abused: Not on file    Physically abused: Not on file    Forced sexual activity: Not on file  Other Topics Concern  . Not on file  Social History Narrative  . Not on file  Family History  Problem Relation Age of Onset  . Hypertension Other   . Hypertension Mother   . Dementia Mother   . Dementia Father   . Prostate cancer Neg Hx   . Bladder Cancer Neg Hx   . Kidney cancer Neg Hx      Current Facility-Administered Medications:  .  0.9 %  sodium chloride infusion, , Intravenous, PRN, Lavina Hamman, MD, Last Rate: 10 mL/hr at 04/11/19 0832, 500 mL at 04/11/19 0832 .  acetaminophen (TYLENOL) tablet 650 mg, 650 mg, Oral, Q6H PRN, 650 mg at 04/10/19 2034 **OR** acetaminophen (TYLENOL) suppository 650 mg, 650 mg, Rectal, Q6H PRN, Lavina Hamman, MD .  aspirin chewable tablet 81 mg, 81 mg, Oral, Daily, Lavina Hamman, MD, 81 mg at 04/11/19 0819 .  Chlorhexidine Gluconate Cloth 2 % PADS 6 each, 6 each, Topical, Daily, Lavina Hamman, MD, 6 each at 04/11/19 [phone] .  fentaNYL (SUBLIMAZE) injection, , Intravenous, PRN, Aletta Edouard, MD, 25 mcg at 04/10/19 0855 .  folic acid (FOLVITE) tablet 1  mg, 1 mg, Oral, Daily, Corcoran, Melissa C, MD, 1 mg at 04/11/19 0820 .  gabapentin (NEURONTIN) capsule 300 mg, 300 mg, Oral, QID, Spongberg, Audie Pinto, MD, 300 mg at 04/11/19 0817 .  lamoTRIgine (LAMICTAL) tablet 200 mg, 200 mg, Oral, BID, Lavina Hamman, MD, 200 mg at 04/11/19 8592 .  lamoTRIgine (LAMICTAL) tablet 25 mg, 25 mg, Oral, BID, Lavina Hamman, MD, 25 mg at 04/11/19 0820 .  lip balm (BLISTEX) ointment, , Topical, PRN, Spongberg, Audie Pinto, MD .  LORazepam (ATIVAN) injection 1 mg, 1 mg, Intravenous, Q12H PRN, Ivor Costa, MD, 1 mg at 04/11/19 0003 .  magic mouthwash w/lidocaine, 5 mL, Oral, TID PRN, Ivor Costa, MD .  MEDLINE mouth rinse, 15 mL, Mouth Rinse, BID, Lavina Hamman, MD, 15 mL at 04/11/19 0828 .  meropenem (MERREM) 1 g in sodium chloride 0.9 % 100 mL IVPB, 1 g, Intravenous, Q8H, Ravishankar, Jayashree, MD, Last Rate: 200 mL/hr at 04/11/19 0834, 1 g at 04/11/19 0834 .  metoprolol tartrate (LOPRESSOR) tablet 12.5 mg, 12.5 mg, Oral, BID, Spongberg, Audie Pinto, MD, 12.5 mg at 04/11/19 0818 .  midazolam (VERSED) injection, , Intravenous, PRN, Aletta Edouard, MD, 1 mg at 04/10/19 0854 .  ondansetron (ZOFRAN) tablet 4 mg, 4 mg, Oral, Q6H PRN **OR** ondansetron (ZOFRAN) injection 4 mg, 4 mg, Intravenous, Q6H PRN, Lavina Hamman, MD .  oxyCODONE (Oxy IR/ROXICODONE) immediate release tablet 5 mg, 5 mg, Oral, Q6H PRN, Lavina Hamman, MD, 5 mg at 04/11/19 0819 .  pantoprazole (PROTONIX) EC tablet 40 mg, 40 mg, Oral, Daily, Lavina Hamman, MD, 40 mg at 04/11/19 0819 .  polyethylene glycol (MIRALAX / GLYCOLAX) packet 17 g, 17 g, Oral, Daily PRN, Lavina Hamman, MD .  polyethylene glycol (MIRALAX / GLYCOLAX) packet 17 g, 17 g, Oral, Once, Lavina Hamman, MD .  potassium chloride SA (KLOR-CON) CR tablet 40 mEq, 40 mEq, Oral, Once, Ivor Costa, MD .  sodium chloride flush (NS) 0.9 % injection 3 mL, 3 mL, Intravenous, Q12H, Lavina Hamman, MD, 3 mL at 04/11/19 0821 .   traZODone (DESYREL) tablet 50 mg, 50 mg, Oral, QHS, Lavina Hamman, MD, 50 mg at 04/10/19 2258 .  [START ON 04/12/2019] vancomycin (VANCOCIN) 1,750 mg in sodium chloride 0.9 % 500 mL IVPB, 1,750 mg, Intravenous, Q24H, Ivor Costa, MD  Physical exam:  Vitals:   04/10/19 1956 04/10/19 1956 04/10/19 2146  04/11/19 0629  BP: 94/66   132/71  Pulse: (!) 111 (!) 108  (!) 110  Resp: 16   18  Temp: (!) 101.3 F (38.5 C)  99.9 F (37.7 C) 98.7 F (37.1 C)  TempSrc: Axillary  Axillary Axillary  SpO2: 97% 98%  98%  Weight:      Height:       Physical Exam Constitutional:      Comments: Drowsy but easily arousable. Responds to verbal commands  HENT:     Head: Normocephalic and atraumatic.     Mouth/Throat:     Comments: Crusted lesions over lip appear to be healing. No thrush Eyes:     Pupils: Pupils are equal, round, and reactive to light.  Neck:     Musculoskeletal: Normal range of motion.  Cardiovascular:     Rate and Rhythm: Normal rate and regular rhythm.     Heart sounds: Normal heart sounds.  Pulmonary:     Effort: Pulmonary effort is normal.     Breath sounds: Normal breath sounds.  Abdominal:     General: Bowel sounds are normal. There is no distension.     Palpations: Abdomen is soft.     Tenderness: There is no abdominal tenderness.  Musculoskeletal:     Right lower leg: Edema present.     Left lower leg: Edema present.  Skin:    General: Skin is warm and dry.      CMP Latest Ref Rng & Units 04/11/2019  Glucose 70 - 99 mg/dL 95  BUN 8 - 23 mg/dL 17  Creatinine 0.61 - 1.24 mg/dL 0.99  Sodium 135 - 145 mmol/L 145  Potassium 3.5 - 5.1 mmol/L 3.4(L)  Chloride 98 - 111 mmol/L 112(H)  CO2 22 - 32 mmol/L 25  Calcium 8.9 - 10.3 mg/dL 8.2(L)  Total Protein 6.5 - 8.1 g/dL -  Total Bilirubin 0.3 - 1.2 mg/dL -  Alkaline Phos 38 - 126 U/L -  AST 15 - 41 U/L -  ALT 0 - 44 U/L -   CBC Latest Ref Rng & Units 04/11/2019  WBC 4.0 - 10.5 K/uL 1.4(LL)  Hemoglobin 13.0 - 17.0  g/dL 9.1(L)  Hematocrit 39.0 - 52.0 % 28.8(L)  Platelets 150 - 400 K/uL 47(L)    '@IMAGES'$ @  US Renal  Result Date: 04/01/2019 CLINICAL DATA:  Acute kidney injury EXAM: RENAL / URINARY TRACT ULTRASOUND COMPLETE COMPARISON:  None. FINDINGS: Right Kidney: Renal measurements: 11.1 x 4.3 x 4.5 cm = volume: 110 mL . Echogenicity within normal limits. No mass or hydronephrosis visualized. Left Kidney: Renal measurements: 11.3 x 5.6 x 4.7 cm = volume: 155 mL. Echogenicity within normal limits. There is a thin walled anechoic simple cyst at the mid to inferior pole the left kidney measuring 2.1 x 1.6 x 2.2 cm. No solid mass or hydronephrosis visualized. Bladder: Decompressed with Foley catheter in place. Other: None. IMPRESSION: Negative for obstructive uropathy. Electronically Signed   By: Davina Poke M.D.   On: 04/01/2019 10:11   Dg Chest Port 1 View  Result Date: 04/11/2019 CLINICAL DATA:  Fever. EXAM: PORTABLE CHEST 1 VIEW COMPARISON:  Radiograph 02/20/2018 FINDINGS: Lung volumes are low.The cardiomediastinal contours are normal. Mild streaky left lung base atelectasis. Pulmonary vasculature is normal. No consolidation, pleural effusion, or pneumothorax. No acute osseous abnormalities are seen. Chronic changes about the shoulders. Remote left rib fracture. IMPRESSION: Low lung volumes with mild left basilar atelectasis. No consolidation to suggest pneumonia. Electronically Signed   By: Threasa Beards  [place] M.D.   On: 04/11/2019 00:44   Ct Bone Marrow Biopsy & Aspiration  Result Date: 04/10/2019 CLINICAL DATA:  Pancytopenia and need for bone marrow biopsy. EXAM: CT GUIDED BONE MARROW ASPIRATION AND BIOPSY ANESTHESIA/SEDATION: Versed 1.0 mg IV, Fentanyl 50 mcg IV Total Moderate Sedation Time:   18 minutes. The patient's level of consciousness and physiologic status were continuously monitored during the procedure by Radiology nursing. PROCEDURE: The procedure risks, benefits, and alternatives were  explained to the patient's son. Questions regarding the procedure were encouraged and answered. The patient's son understands and consents to the procedure. A time out was performed prior to initiating the procedure. The right gluteal region was prepped with chlorhexidine. Sterile gown and sterile gloves were used for the procedure. Local anesthesia was provided with 1% Lidocaine. Under CT guidance, an 11 gauge On Control bone cutting needle was advanced from a posterior approach into the right iliac bone. Needle positioning was confirmed with CT. Initial non heparinized and heparinized aspirate samples were obtained of bone marrow. Core biopsy was performed via the On Control drill needle. COMPLICATIONS: None FINDINGS: Inspection of initial aspirate did reveal visible particles. Intact core biopsy sample was obtained. IMPRESSION: CT guided bone marrow biopsy of right posterior iliac bone with both aspirate and core samples obtained. Electronically Signed   By: Aletta Edouard M.D.   On: 04/10/2019 11:14     Assessment and plan- Patient is a 78 y.o. male with h/o rheumatoid arthritis who was on methotrexate admitted for b/l LE cellulitis and pancytopenia    1. Pancytopenia: likely due to MTX toxicity. Although MTX levels were low that still does not rule out toxicity. It will likely take 1-2 weeks for count recovery. Patient had BM Bx yesterday to r/o underlying primary bone marrow process.   Thrombocytopenia: Platelet counts have improved today to 47. No need for platelet transfusion but keep platelets >15 if they fall.  Neutropenia: severe anc 0.2 yesterday. Will start neupogen 300 mcg to facilitate count recovery given neutropenic fever  Anemia- will add iron studies to am labs. Continue folic acid. Folic acid levels were low. Likely due to MTX toxicity  2. Neutropenic fever- on meropenem and vancomycin. sourec unclear. May be due to b/l LE cellulitis. He has chronic severe lymphedema. Blood  cultures are other infectious work up negative so far.  3. Disposition- pient remains severely deconditioned/ debilitated. Wife was at the bedside and updated regarding his counts and further plan. She verbalized understanding   Visit Diagnosis 1. Cellulitis of right lower extremity   2. Lymphedema   3. AKI (acute kidney injury) ([place])   4. Folic acid deficiency   5. Pancytopenia ([place])   6. Fever      Dr. Randa Evens, MD, MPH [facility] [phone] 04/11/2019 3:40 PM

## 2019-04-12 ENCOUNTER — Inpatient Hospital Stay: Payer: Medicare Other

## 2019-04-12 LAB — BASIC METABOLIC PANEL
Anion gap: 7 (ref 5–15)
BUN: 14 mg/dL (ref 8–23)
CO2: 24 mmol/L (ref 22–32)
Calcium: 8.3 mg/dL — ABNORMAL LOW (ref 8.9–10.3)
Chloride: 117 mmol/L — ABNORMAL HIGH (ref 98–111)
Creatinine, Ser: 1.09 mg/dL (ref 0.61–1.24)
GFR calc Af Amer: >60 mL/min (ref >60)
GFR calc non Af Amer: >60 mL/min (ref >60)
Glucose, Bld: 75 mg/dL (ref 70–99)
Potassium: 3.8 mmol/L (ref 3.5–5.1)
Sodium: 148 mmol/L — ABNORMAL HIGH (ref 135–145)

## 2019-04-12 LAB — CBC WITH DIFFERENTIAL/PLATELET
Abs Immature Granulocytes: 0.06 10*3/uL (ref 0.00–0.07)
Basophils Absolute: 0 10*3/uL (ref 0.0–0.1)
Basophils Relative: 0 %
Eosinophils Absolute: 0.4 10*3/uL (ref 0.0–0.5)
Eosinophils Relative: 19 %
HCT: 28.3 % — ABNORMAL LOW (ref 39.0–52.0)
Hemoglobin: 8.9 g/dL — ABNORMAL LOW (ref 13.0–17.0)
Immature Granulocytes: 3 %
Lymphocytes Relative: 59 %
Lymphs Abs: 1.1 10*3/uL (ref 0.7–4.0)
MCH: 28.2 pg (ref 26.0–34.0)
MCHC: 31.4 g/dL (ref 30.0–36.0)
MCV: 89.6 fL (ref 80.0–100.0)
Monocytes Absolute: 0.3 10*3/uL (ref 0.1–1.0)
Monocytes Relative: 16 %
Neutro Abs: 0.1 10*3/uL — ABNORMAL LOW (ref 1.7–7.7)
Neutrophils Relative %: 3 %
Platelets: 94 10*3/uL — ABNORMAL LOW (ref 150–400)
RBC: 3.16 MIL/uL — ABNORMAL LOW (ref 4.22–5.81)
RDW: 18.5 % — ABNORMAL HIGH (ref 11.5–15.5)
Smear Review: NORMAL
WBC: 1.9 10*3/uL — ABNORMAL LOW (ref 4.0–10.5)
nRBC: 2.1 % — ABNORMAL HIGH (ref 0.0–0.2)

## 2019-04-12 LAB — IRON AND TIBC
Iron: 16 ug/dL — ABNORMAL LOW (ref 45–182)
Saturation Ratios: 14 % — ABNORMAL LOW (ref 17.9–39.5)
TIBC: 119 ug/dL — ABNORMAL LOW (ref 250–450)
UIBC: 103 ug/dL

## 2019-04-12 LAB — BRAIN NATRIURETIC PEPTIDE: B Natriuretic Peptide: 27 pg/mL (ref 0.0–100.0)

## 2019-04-12 LAB — URINE CULTURE: Culture: NO GROWTH

## 2019-04-12 LAB — FERRITIN: Ferritin: 368 ng/mL — ABNORMAL HIGH (ref 24–336)

## 2019-04-12 LAB — AMMONIA: Ammonia: 10 umol/L (ref 9–35)

## 2019-04-12 MED ORDER — SODIUM CHLORIDE 0.45 % IV SOLN
INTRAVENOUS | Status: AC
  Administered 2019-04-12: 10:00:00 via INTRAVENOUS

## 2019-04-12 MED ORDER — GABAPENTIN 100 MG PO CAPS
200.0000 mg | ORAL_CAPSULE | Freq: Three times a day (TID) | ORAL | Status: DC
  Administered 2019-04-12 – 2019-04-19 (×20): 200 mg via ORAL
  Filled 2019-04-12 (×21): qty 2

## 2019-04-12 MED ORDER — GADOBUTROL 1 MMOL/ML IV SOLN
10.0000 mL | Freq: Once | INTRAVENOUS | Status: AC | PRN
  Administered 2019-04-12: 10 mL via INTRAVENOUS

## 2019-04-12 NOTE — Consult Note (Signed)
Reason for Consult:AMS Referring Physician: Blaine Hamper  CC: AMS  HPI: Jeremiah [name]. is an 78 y.o. male with a history of HTN, seizures and RA on immunosuppressant therapy who presented on 11/10 with complaints of worsening cellulitis.  Patient has been treated for cellulitis and is being evaluated for pancytopenia as well.  Wife has concerns about altered mental status.  Wife reports the family has noticed some cognitive issues for about a year.  They have become significantly worse since his most recent illness and requirement for hospitalization.     Past Medical History:  Diagnosis Date  . Allergy   . Arthritis   . Hypertension   . Rheumatoid arthritis ([place])   . Seizures ([place])   . Sepsis ([place]) 05/23/2015    Past Surgical History:  Procedure Laterality Date  . back surgery five times  04/2016  . CARPAL TUNNEL RELEASE    . HERNIA REPAIR    . JOINT REPLACEMENT    . knee replacement right    . right hip replacement    . rotator cuff surgery    . SPINE SURGERY      Family History  Problem Relation Age of Onset  . Hypertension Other   . Hypertension Mother   . Dementia Mother   . Dementia Father   . Prostate cancer Neg Hx   . Bladder Cancer Neg Hx   . Kidney cancer Neg Hx     Social History:  reports that he has quit smoking. His smoking use included cigarettes. He has a 20.00 pack-year smoking history. He has never used smokeless tobacco. He reports that he does not drink alcohol. No history on file for drug.  Allergies  Allergen Reactions  . Cefoperazone Anaphylaxis  . Penicillins Anaphylaxis and Other (See Comments)    Has patient had a PCN reaction causing immediate rash, facial/tongue/throat swelling, SOB or lightheadedness with hypotension: Yes Has patient had a PCN reaction causing severe rash involving mucus membranes or skin necrosis: No Has patient had a PCN reaction that required hospitalization No Has patient had a PCN reaction occurring within the last 10 years:  No If all of the above answers are "NO", then may proceed with Cephalosporin use.  . Latex Rash    Medications:  I have reviewed the patient's current medications. Prior to Admission:  Medications Prior to Admission  Medication Sig Dispense Refill Last Dose  . aspirin 81 MG chewable tablet Chew 81 mg by mouth daily.   03/31/2019 at 0800  . Cinnamon 500 MG capsule Take 500 mg by mouth 2 (two) times daily.   03/31/2019 at 0800  . ciprofloxacin (CIPRO) 500 MG tablet Take 500 mg by mouth 2 (two) times daily.   03/31/2019 at 0800  . [EXPIRED] clindamycin (CLEOCIN) 300 MG capsule Take 300 mg by mouth 4 (four) times daily. For 14 days   03/31/2019 at 0800  . etanercept (ENBREL SURECLICK) 50 MG/ML injection Inject 50 mg into the skin every Friday.    Past Week at Unknown time  . folic acid (FOLVITE) 1 MG tablet Take 1 mg by mouth daily.   03/31/2019 at 0800  . gabapentin (NEURONTIN) 300 MG capsule Take 1 capsule (300 mg total) by mouth 4 (four) times daily. QHS 120 capsule 0 03/30/2019 at 2000  . hydroxychloroquine (PLAQUENIL) 200 MG tablet Take 400 mg by mouth at bedtime.   03/30/2019 at 2000  . lamoTRIgine (LAMICTAL) 200 MG tablet Take 200 mg by mouth 2 (two) times daily.  03/31/2019 at 0800  . lamoTRIgine (LAMICTAL) 25 MG tablet Take 25 mg by mouth 2 (two) times daily.   03/31/2019 at 0800  . magnesium oxide (MAG-OX) 400 MG tablet Take 400 mg by mouth daily.   03/31/2019 at 0800  . methotrexate (RHEUMATREX) 2.5 MG tablet TAKE 10 TABS BY MOUTH ONCE A WEEK   03/30/2019 at Unknown time  . nystatin-triamcinolone ointment (MYCOLOG) Apply 1 application topically 2 (two) times daily. 30 g 2 03/31/2019 at 0800  . omeprazole (PRILOSEC) 40 MG capsule Take 40 mg by mouth daily.    03/31/2019 at 0800  . traZODone (DESYREL) 50 MG tablet Take 50 mg by mouth at bedtime.   03/30/2019 at 2000  . acetaminophen (TYLENOL) 325 MG tablet Take 2 tablets (650 mg total) by mouth every 6 (six) hours as needed for mild pain  (or Fever >/= 101).   prn at prn  . polyethylene glycol (MIRALAX / GLYCOLAX) packet Take 17 g by mouth daily. 14 each 0 prn at prn   Scheduled: . aspirin  81 mg Oral Daily  . Chlorhexidine Gluconate Cloth  6 each Topical Daily  . folic acid  1 mg Oral Daily  . gabapentin  300 mg Oral QID  . lamoTRIgine  200 mg Oral BID  . lamoTRIgine  25 mg Oral BID  . loratadine  10 mg Oral Daily  . mouth rinse  15 mL Mouth Rinse BID  . metoprolol tartrate  12.5 mg Oral BID  . pantoprazole  40 mg Oral Daily  . polyethylene glycol  17 g Oral Once  . sodium chloride flush  3 mL Intravenous Q12H  . Tbo-Filgrastim  300 mcg Subcutaneous Daily  . traZODone  50 mg Oral QHS    ROS: History obtained from the patient  General ROS: negative for - chills, fatigue, fever, night sweats, weight gain or weight loss Psychological ROS: memory difficulties Ophthalmic ROS: negative for - blurry vision, double vision, eye pain or loss of vision ENT ROS: negative for - epistaxis, nasal discharge, oral lesions, sore throat, tinnitus or vertigo Allergy and Immunology ROS: negative for - hives or itchy/watery eyes Hematological and Lymphatic ROS: negative for - bleeding problems, bruising or swollen lymph nodes Endocrine ROS: negative for - galactorrhea, hair pattern changes, polydipsia/polyuria or temperature intolerance Respiratory ROS: negative for - cough, hemoptysis, shortness of breath or wheezing Cardiovascular ROS: negative for - chest pain, dyspnea on exertion, edema or irregular heartbeat Gastrointestinal ROS: negative for - abdominal pain, diarrhea, hematemesis, nausea/vomiting or stool incontinence Genito-Urinary ROS: negative for - dysuria, hematuria, incontinence or urinary frequency/urgency Musculoskeletal ROS: leg infection Neurological ROS: as noted in HPI Dermatological ROS: negative for rash and skin lesion changes  Physical Examination: Blood pressure (!) 97/54, pulse 89, temperature 98.5 F (36.9  C), temperature source Oral, resp. rate 17, height 6' (1.829 m), weight 118.2 kg, SpO2 95 %.  HEENT-  Normocephalic, no lesions, without obvious abnormality.  Normal external eye and conjunctiva.  Normal TM's bilaterally.  Normal auditory canals and external ears. Normal external nose, mucus membranes and septum.  Normal pharynx. Cardiovascular- S1, S2 normal, pulses palpable throughout   Lungs- chest clear, no wheezing, rales, normal symmetric air entry Abdomen- soft, non-tender; bowel sounds normal; no masses,  no organomegaly Extremities- LE swelling Lymph-no adenopathy palpable Musculoskeletal-no joint tenderness, deformity or swelling Skin-bruising on UE's, LE in bandaging  Neurological Examination   Mental Status: Alert.  Reports he is in [place].  Reports it is 20-2 for  the year.  Knows the month and that Thanksgiving is coming up.  Knows age and birth date.  Emotionally labile.  Speech fluent without evidence of aphasia.  Able to follow 3 step commands with some mild reinforcement. Cranial Nerves: II: Visual fields grossly normal, pupils equal, round, reactive to light and accommodation III,IV, VI: ptosis not present, extra-ocular motions intact bilaterally V,VII: smile symmetric, facial light touch sensation normal bilaterally VIII: hearing normal bilaterally IX,X: gag reflex present XI: bilateral shoulder shrug XII: midline tongue extension Motor: 5/5 strength in the BUE's.  Unable to lift either lower extremity off the bed Sensory: Pinprick and light touch intact throughout, bilaterally Deep Tendon Reflexes: Symmetric throughout Plantars: Right: unable to test   Left: unable to test Cerebellar: Normal finger-to-nose testing bilaterally Gait: not tested due to safety concerns    Laboratory Studies:   Basic Metabolic Panel: Recent Labs  Lab 04/06/19 0550 04/09/19 0616 04/10/19 0701 04/11/19 0842 04/12/19 0504  NA 148* 149* 145 145 148*  K 3.8 4.2 3.5 3.4* 3.8   CL 110 112* 110 112* 117*  CO2 _0 GLUCOSE 99 93 85 95 75  BUN _1 CREATININE 0.94 0.98 1.01 0.99 1.09  CALCIUM 8.9 9.0 8.6* 8.2* 8.3*    Liver Function Tests: No results for input(s): AST, ALT, ALKPHOS, BILITOT, PROT, ALBUMIN in the last 168 hours. No results for input(s): LIPASE, AMYLASE in the last 168 hours. No results for input(s): AMMONIA in the last 168 hours.  CBC: Recent Labs  Lab 04/08/19 0441 04/09/19 0616 04/10/19 0701 04/11/19 0842 04/12/19 0504  WBC 1.6* 1.5* 1.5* 1.4* 1.9*  NEUTROABS 0.7* 0.4* 0.2* 0.1* 0.1*  HGB 9.2* 9.3* 9.7* 9.1* 8.9*  HCT 29.7* 28.7* 31.0* 28.8* 28.3*  MCV 90.5 87.2 90.6 89.4 89.6  PLT 51* 33* 23* 47* 94*    Cardiac Enzymes: No results for input(s): CKTOTAL, CKMB, CKMBINDEX, TROPONINI in the last 168 hours.  BNP: Invalid input(s): POCBNP  CBG: No results for input(s): GLUCAP in the last 168 hours.  Microbiology: Results for orders placed or performed during the hospital encounter of 03/31/19  Culture, blood (routine x 2)     Status: None   Collection Time: 03/31/19  2:40 PM   Specimen: BLOOD  Result Value Ref Range Status   Specimen Description BLOOD LEFT ANTECUBITAL  Final   Special Requests   Final    BOTTLES DRAWN AEROBIC AND ANAEROBIC Blood Culture adequate volume   Culture   Final    NO GROWTH 11 DAYS Performed at [facility], [address] [place], [place] [address]    Report Status 04/11/2019 FINAL  Final  Culture, blood (routine x 2)     Status: None   Collection Time: 03/31/19  2:40 PM   Specimen: BLOOD  Result Value Ref Range Status   Specimen Description BLOOD BLOOD LEFT HAND  Final   Special Requests   Final    BOTTLES DRAWN AEROBIC AND ANAEROBIC Blood Culture adequate volume   Culture   Final    NO GROWTH 11 DAYS Performed at [facility], [address], [place] [place], [place] [address]    Report Status 04/11/2019 FINAL  Final  SARS CORONAVIRUS 2 (TAT 6-24 HRS)  Nasopharyngeal Nasopharyngeal Swab     Status: None   Collection Time: 03/31/19  2:51 PM   Specimen: Nasopharyngeal Swab  Result Value Ref Range Status   SARS Coronavirus 2 NEGATIVE NEGATIVE Final  Comment: (NOTE) SARS-CoV-2 target nucleic acids are NOT DETECTED. The SARS-CoV-2 RNA is generally detectable in upper and lower respiratory specimens during the acute phase of infection. Negative results do not preclude SARS-CoV-2 infection, do not rule out co-infections with other pathogens, and should not be used as the sole basis for treatment or other patient management decisions. Negative results must be combined with clinical observations, patient history, and epidemiological information. The expected result is Negative. Fact Sheet for Patients: SugarRoll.be Fact Sheet for Healthcare Providers: https://www.woods-mathews.com/ This test is not yet approved or cleared by the [place] FDA and  has been authorized for detection and/or diagnosis of SARS-CoV-2 by FDA under an Emergency Use Authorization (EUA). This EUA will remain  in effect (meaning this test can be used) for the duration of the COVID-19 declaration under Section 56 4(b)(1) of the Act, 21 U.S.C. section 360bbb-3(b)(1), unless the authorization is terminated or revoked sooner. Performed at [facility], [place] [address] [place], [place] [address]   Culture, blood (x 2)     Status: None   Collection Time: 04/05/19 11:17 AM   Specimen: BLOOD  Result Value Ref Range Status   Specimen Description BLOOD LEFT ANTECUBITAL  Final   Special Requests   Final    BOTTLES DRAWN AEROBIC AND ANAEROBIC Blood Culture adequate volume   Culture   Final    NO GROWTH 5 DAYS Performed at [facility], [address], [place], [place] [address]    Report Status 04/10/2019 FINAL  Final  Culture, blood (x 2)     Status: None   Collection Time: 04/05/19 11:25 AM   Specimen:  BLOOD  Result Value Ref Range Status   Specimen Description BLOOD RIGHT ANTECUBITAL  Final   Special Requests   Final    BOTTLES DRAWN AEROBIC AND ANAEROBIC Blood Culture adequate volume   Culture   Final    NO GROWTH 5 DAYS Performed at [facility], [address] [place], [place] [address]    Report Status 04/10/2019 FINAL  Final  SARS CORONAVIRUS 2 (TAT 6-24 HRS) Nasopharyngeal Nasopharyngeal Swab     Status: None   Collection Time: 04/08/19  2:04 PM   Specimen: Nasopharyngeal Swab  Result Value Ref Range Status   SARS Coronavirus 2 NEGATIVE NEGATIVE Final    Comment: (NOTE) SARS-CoV-2 target nucleic acids are NOT DETECTED. The SARS-CoV-2 RNA is generally detectable in upper and lower respiratory specimens during the acute phase of infection. Negative results do not preclude SARS-CoV-2 infection, do not rule out co-infections with other pathogens, and should not be used as the sole basis for treatment or other patient management decisions. Negative results must be combined with clinical observations, patient history, and epidemiological information. The expected result is Negative. Fact Sheet for Patients: SugarRoll.be Fact Sheet for Healthcare Providers: https://www.woods-mathews.com/ This test is not yet approved or cleared by the [place] FDA and  has been authorized for detection and/or diagnosis of SARS-CoV-2 by FDA under an Emergency Use Authorization (EUA). This EUA will remain  in effect (meaning this test can be used) for the duration of the COVID-19 declaration under Section 56 4(b)(1) of the Act, 21 U.S.C. section 360bbb-3(b)(1), unless the authorization is terminated or revoked sooner. Performed at [facility], [place] [address] [place],  [address]   CULTURE, BLOOD (ROUTINE X 2) w Reflex to ID Panel     Status: None (Preliminary result)   Collection Time: 04/10/19 11:55 PM   Specimen:  BLOOD   Result Value Ref Range Status   Specimen Description BLOOD RIGHT ANTECUBITAL  Final   Special Requests   Final    BOTTLES DRAWN AEROBIC AND ANAEROBIC Blood Culture adequate volume   Culture   Final    NO GROWTH 1 DAY Performed at [facility], [address], [place], [place] [address]    Report Status PENDING  Incomplete  CULTURE, BLOOD (ROUTINE X 2) w Reflex to ID Panel     Status: None (Preliminary result)   Collection Time: 04/10/19 11:59 PM   Specimen: BLOOD  Result Value Ref Range Status   Specimen Description BLOOD RIGHT ARM  Final   Special Requests   Final    BOTTLES DRAWN AEROBIC AND ANAEROBIC Blood Culture adequate volume   Culture   Final    NO GROWTH 1 DAY Performed at [facility], [address], [place], [place] [address]    Report Status PENDING  Incomplete  Urine Culture     Status: None   Collection Time: 04/11/19  2:00 AM   Specimen: Urine, Random  Result Value Ref Range Status   Specimen Description   Final    URINE, RANDOM Performed at [facility], [address], [place], [place] [address]    Special Requests   Final    NONE Performed at [facility], [address], [place], [place] [address]    Culture   Final    NO GROWTH Performed at [facility], [place] [address] [place], [place] [address]    Report Status 04/12/2019 FINAL  Final    Coagulation Studies: No results for input(s): LABPROT, INR in the last 72 hours.  Urinalysis:  Recent Labs  Lab 04/06/19 1834  [place] 1.025  PHURINE TEST NOT REPORTED DUE TO COLOR INTERFERENCE OF URINE PIGMENT  GLUCOSEU TEST NOT REPORTED DUE TO COLOR INTERFERENCE OF URINE PIGMENT*  HGBUR TEST NOT REPORTED DUE TO COLOR INTERFERENCE OF URINE PIGMENT*  BILIRUBINUR TEST NOT REPORTED DUE TO COLOR INTERFERENCE OF URINE PIGMENT*  KETONESUR TEST NOT REPORTED DUE TO COLOR INTERFERENCE OF URINE PIGMENT*  PROTEINUR TEST NOT REPORTED DUE TO COLOR  INTERFERENCE OF URINE PIGMENT*  NITRITE TEST NOT REPORTED DUE TO COLOR INTERFERENCE OF URINE PIGMENT*  LEUKOCYTESUR TEST NOT REPORTED DUE TO COLOR INTERFERENCE OF URINE PIGMENT*    Lipid Panel:  No results found for: CHOL, TRIG, HDL, CHOLHDL, VLDL, LDLCALC  HgbA1C: No results found for: HGBA1C  Urine Drug Screen:  No results found for: LABOPIA, COCAINSCRNUR, LABBENZ, AMPHETMU, THCU, LABBARB  Alcohol Level: No results for input(s): ETH in the last 168 hours.   Imaging: Ct Head Wo Contrast  Result Date: 04/11/2019 CLINICAL DATA:  Encephalopathy EXAM: CT HEAD WITHOUT CONTRAST TECHNIQUE: Contiguous axial images were obtained from the base of the skull through the vertex without intravenous contrast. COMPARISON:  05/23/2015 FINDINGS: Technical note: Examination is significantly degraded by patient motion artifact. Patient was unable to follow instructions while in CT. Examination was repeated a total of 3 times with persistent artifact on all series which degrades image quality. Brain: No obvious large territory acute infarction or area of intracranial hemorrhage identified on these limited images. Mild prominence of the ventricles suggests mild cerebral atrophy, this is stable from prior. Vascular: Limited. Skull: No obvious skull fracture or bone lesion. Sinuses/Orbits: Grossly intact. Other: None. IMPRESSION: 1. Markedly limited exam secondary to patient motion artifact related to patient condition. 2. No obvious acute findings identified  within the limitations of this exam. A repeat study when patient is able to cooperate is recommended. Electronically Signed   By: Davina Poke M.D.   On: 04/11/2019 17:50   Dg Chest Port 1 View  Result Date: 04/11/2019 CLINICAL DATA:  Fever. EXAM: PORTABLE CHEST 1 VIEW COMPARISON:  Radiograph 02/20/2018 FINDINGS: Lung volumes are low.The cardiomediastinal contours are normal. Mild streaky left lung base atelectasis. Pulmonary vasculature is normal. No  consolidation, pleural effusion, or pneumothorax. No acute osseous abnormalities are seen. Chronic changes about the shoulders. Remote left rib fracture. IMPRESSION: Low lung volumes with mild left basilar atelectasis. No consolidation to suggest pneumonia. Electronically Signed   By: Keith Rake M.D.   On: 04/11/2019 00:44     Assessment/Plan: 78 y.o. male with a history of HTN, seizures and RA on immunosuppressant therapy whose family reports complaints of worsening mental status, present for at least the past year and worsening with recent illness and hospitalization.   Patient with a family history of dementia.  Head CT performed but poor quality due to motion.  No obvious abnormalities noted.  Patient with recent infection, on [place], hospitalized and with some metabolic abnormalities all of which may be contributing to worsening mental status.  With history of seizure and immunosuppressants will rule out other possibilities as well.  B12, TSH normal.  ESR only mildly elevated.    Recommendations: 1.  B1 level, CMET, serum ammonia 2. EEG 3. MRI of the brain with and without contrast   4. Consider decrease in Neurontin to 217m TID if EEG unremarkable.    LAlexis Goodell MD Neurology [id number]/22/2020, 2:55 PM

## 2019-04-12 NOTE — Progress Notes (Signed)
PROGRESS NOTE    Jeremiah [name].  KF:[id number] DOB: 1940-07-18 DOA: 03/31/2019 PCP: Derinda Late, MD    Brief Narrative:  Jeremiah [name]. is a 78 y.o. male with Past medical history of rheumatoid arthritis on immunosuppressive therapy, seizures, HTN, chronic lymphedema. Patient presented with complaints of worsening leg infection. Patient was seen on 03/26/2019 in the ED at [facility] for cellulitis of the leg.  Patient was discharged on oral Cipro and clindamycin. Patient was taking all his medication up to this many days and was compliant with it. There was an area of infection and redness which was marked. Since last 2 days patient's pain was worsening and that area of redness across the marked line and therefore patient decided to come to the hospital. No nausea no vomiting no fever no chills no chest pain abdominal pain no diarrhea no constipation. Patient remains compliant with all his medication. Continue to take all his immunosuppressive medication throughout this infection course.  ED Course: Presents with above complaint.  And referred for admission for failure for outpatient therapy.  At his baseline ambulates with assistance independent for most of his ADL;  manages his medication on his own.  Interim History: - 11/20 CT guided bone marrow aspiration and biopsy - 11/20: due to fever with neutropenia, Dr. Steva Ready of ID started patient with vancomycin and meropenem, pending Bx and Ux - 11/20: Tbo-Filgrastim (GRANIX) injection 300 mcg was given - 11/20: neuro was consulted for AMS. CT-head is a limited study, but did not showed acute intracranial abnormalities.  - pancytopenia, platelet improved:  CBC Latest Ref Rng & Units 04/12/2019 04/11/2019 04/10/2019  WBC 4.0 - 10.5 K/uL 1.9(L) 1.4(LL) 1.5(L)  Hemoglobin 13.0 - 17.0 g/dL 8.9(L) 9.1(L) 9.7(L)  Hematocrit 39.0 - 52.0 % 28.3(L) 28.8(L) 31.0(L)  Platelets 150 - 400 K/uL 94(L) 47(L) 23(LL)    Subjective:  Has bilateral leg and foot edema, lymphedema and mild leg tenterness. No CP, AP, fever or chills.  No nausea, vomiting, diarrhea or abdominal pain. Pt is confused, knows his own name, but not oriented x 3.   Assessment & Plan:   Principal Problem:   Neutropenia with fever (HCC) Active Problems:   Rheumatoid arthritis (HCC)   Seizure (HCC)   Pressure injury of skin   Other pancytopenia (HCC)   Folic acid deficiency   Cellulitis of lower extremity   AKI (acute kidney injury) ([place])   Pancytopenia (HCC)   Acute metabolic encephalopathy  Neutropenia with fever:  Dr. Delaine Lame of infectious disease was consulted.  Started the patient with vancomycin and meropenem 11/20 -Vancomycin and meropenem -Follow-up blood culture and urine culture -Neutropenic precaution  Acute metabolic encephalopathy: Patient has been intermittently confused, etiology is not clear.  Differential diagnoses include delirium.  CT head was done to make sure pt does not have intracranial bleeding given thrombocytopenia, which is a limited study, but did not showed acute intracranial abnormalities.  Neurology, Dr. Doy Mince was consulted, highly appreciated Dr. Doy Mince consultation.   -Follow-up recommendations as follows:  1.  B1 level, CMET, serum ammonia  2. EEG  3. MRI of the brain with and without contrast    4. Consider decrease in Neurontin to 200mg  TID if EEG unremarkable.   -Frequent neuro checks  Cellulitis of lower extremity superimposed on venous stasis changes associated lymphedema: blood culture no grow so far. Pt developed severe pancytopenia. ID was consulted 11/19. BNP 27. -Doxycycline stopped 11/19 -f/u Bx  New pancytopenia with neutropenia: Unclear etiology.  Likely due to mtx toxicity.  Heme-onc was consulted, oncology is on board, highly appreciated. 11/20 CT guided bone marrow aspiration and biopsy was done by IR. Platelet continue to improve. -neutropenic [id number],  -on folic  acid -iron study showed  -Tbo-Filgrastim (GRANIX) injection 300 mcg was given by oncology 11/20  CBC Latest Ref Rng & Units 04/12/2019 04/11/2019 04/10/2019  WBC 4.0 - 10.5 K/uL 1.9(L) 1.4(LL) 1.5(L)  Hemoglobin 13.0 - 17.0 g/dL 8.9(L) 9.1(L) 9.7(L)  Hematocrit 39.0 - 52.0 % 28.3(L) 28.8(L) 31.0(L)  Platelets 150 - 400 K/uL 94(L) 47(L) 23(LL)    Acute kidney injury: resolved. Cre is 1.09 -f/u by BMP  Rheumatoid arthritis:continueholding immunosuppressants currently, heme-onc as noted concerned about methotrexate toxicity  Neuropathy:  -Continue gabapentin -->decreased dose from 300 4 time per day to 200 mg tid on 11/22  Seizure disorder: no evidence of breakthrough seizures -ContinueLamictal -seizure precaution  Pressure ulcers present on admission. These were bilateral buttock as well as mid sacrum: Wound care was consultedappreciate their recommendations. -Continue with silver Hydrofiber, and foam changing every 3 days and as needed for soilage  DVT prophylaxis: SCD Code Status: full Family Communication: yes, called his daughter in-law Disposition Plan: not ready for discharge. Patient remains severely deconditioned/ debilitated. has neutropenia fever and worsening mental status.    Consultants:  Infectious disease ID Oncology IR  Procedures: 11/20: CT guided bone marrow aspiration and biopsy  Antimicrobials: Anti-infectives (From admission, onward)   Start     Dose/Rate Route Frequency Ordered Stop   04/12/19 0000  vancomycin (VANCOCIN) 1,750 mg in sodium chloride 0.9 % 500 mL IVPB     1,750 mg 250 mL/hr over 120 Minutes Intravenous Every 24 hours 04/11/19 0508     04/11/19 0000  vancomycin (VANCOCIN) 2,000 mg in sodium chloride 0.9 % 500 mL IVPB     2,000 mg 250 mL/hr over 120 Minutes Intravenous  Once 04/10/19 2346 04/11/19 0228   04/11/19 0000  meropenem (MERREM) 1 g in sodium chloride 0.9 % 100 mL IVPB     1 g 200 mL/hr over 30 Minutes Intravenous  Every 8 hours 04/10/19 2346     04/03/19 1230  doxycycline (VIBRA-TABS) tablet 100 mg  Status:  Discontinued     100 mg Oral Every 12 hours 04/03/19 1224 04/09/19 1547   04/02/19 1500  vancomycin (VANCOCIN) 1,250 mg in sodium chloride 0.9 % 250 mL IVPB  Status:  Discontinued     1,250 mg 166.7 mL/hr over 90 Minutes Intravenous Every 48 hours 03/31/19 1902 04/01/19 0844   04/02/19 0800  vancomycin (VANCOCIN) 1,250 mg in sodium chloride 0.9 % 250 mL IVPB  Status:  Discontinued     1,250 mg 166.7 mL/hr over 90 Minutes Intravenous Every 36 hours 04/01/19 0844 04/03/19 1224   04/02/19 0000  minocycline (MINOCIN) 100 MG capsule     100 mg Oral 2 times daily 04/02/19 1254 04/12/19 2359   03/31/19 1830  vancomycin (VANCOCIN) IVPB 1000 mg/200 mL premix     1,000 mg 200 mL/hr over 60 Minutes Intravenous  Once 03/31/19 1819 03/31/19 2209   03/31/19 1445  vancomycin (VANCOCIN) IVPB 1000 mg/200 mL premix     1,000 mg 200 mL/hr over 60 Minutes Intravenous  Once 03/31/19 1436 03/31/19 1643         Objective: Vitals:   04/12/19 0138 04/12/19 0228 04/12/19 0355 04/12/19 1140  BP: 111/70  119/82 (!) 97/54  Pulse: 100  99 89  Resp: 18  20 17   Temp:  99.2 F (37.3 C)  99.7 F (37.6 C) 98.5 F (36.9 C)  TempSrc: Oral  Oral Oral  SpO2: 100%  97% 95%  Weight:      Height:  6' (1.829 m)      Intake/Output Summary (Last 24 hours) at 04/12/2019 1622 Last data filed at 04/12/2019 0807 Gross per 24 hour  Intake 528.63 ml  Output 900 ml  Net -371.37 ml   Filed Weights   04/08/19 0500 04/09/19 0500 04/10/19 0500  Weight: 112.9 kg 112.9 kg 118.2 kg    Examination:  Physical Exam:  General: Not in acute distress HEENT: PERRL, EOMI, no scleral icterus, No JVD or bruit Cardiac: S1/S2, RRR, No murmurs, gallops or rubs Pulm:  No rales, wheezing, rhonchi or rubs. Abd: Soft, nondistended, nontender, no rebound pain, no organomegaly, BS present Ext: has bilateral leg and foot edema and  lymphedema. 1+DP/PT pulse bilaterally Musculoskeletal: No joint deformities, erythema, or stiffness, ROM full Skin: No rashes.  Neuro: Pt is confused, knows his own name, but not oriented x 3. cranial nerves II-XII grossly intact, moves all extremeties normally. Psych: Patient is not psychotic, no suicidal or hemocidal ideation.    Data Reviewed: I have personally reviewed following labs and imaging studies  CBC: Recent Labs  Lab 04/08/19 0441 04/09/19 0616 04/10/19 0701 04/11/19 0842 04/12/19 0504  WBC 1.6* 1.5* 1.5* 1.4* 1.9*  NEUTROABS 0.7* 0.4* 0.2* 0.1* 0.1*  HGB 9.2* 9.3* 9.7* 9.1* 8.9*  HCT 29.7* 28.7* 31.0* 28.8* 28.3*  MCV 90.5 87.2 90.6 89.4 89.6  PLT 51* 33* 23* 47* 94*   Basic Metabolic Panel: Recent Labs  Lab 04/06/19 0550 04/09/19 0616 04/10/19 0701 04/11/19 0842 04/12/19 0504  NA 148* 149* 145 145 148*  K 3.8 4.2 3.5 3.4* 3.8  CL 110 112* 110 112* 117*  CO2 29 27 25 25 24   GLUCOSE 99 93 85 95 75  BUN 23 19 18 17 14   CREATININE 0.94 0.98 1.01 0.99 1.09  CALCIUM 8.9 9.0 8.6* 8.2* 8.3*   GFR: Estimated Creatinine Clearance: 75.3 mL/min (by C-G formula based on SCr of 1.09 mg/dL). Liver Function Tests: No results for input(s): AST, ALT, ALKPHOS, BILITOT, PROT, ALBUMIN in the last 168 hours. No results for input(s): LIPASE, AMYLASE in the last 168 hours. No results for input(s): AMMONIA in the last 168 hours. Coagulation Profile: No results for input(s): INR, PROTIME in the last 168 hours. Cardiac Enzymes: No results for input(s): CKTOTAL, CKMB, CKMBINDEX, TROPONINI in the last 168 hours. BNP (last 3 results) No results for input(s): PROBNP in the last 8760 hours. HbA1C: No results for input(s): HGBA1C in the last 72 hours. CBG: No results for input(s): GLUCAP in the last 168 hours. Lipid Profile: No results for input(s): CHOL, HDL, LDLCALC, TRIG, CHOLHDL, LDLDIRECT in the last 72 hours. Thyroid Function Tests: No results for input(s): TSH,  T4TOTAL, FREET4, T3FREE, THYROIDAB in the last 72 hours. Anemia Panel: Recent Labs    04/12/19 0504  FERRITIN 368*  TIBC 119*  IRON 16*   Sepsis Labs: No results for input(s): PROCALCITON, LATICACIDVEN in the last 168 hours.  Recent Results (from the past 240 hour(s))  Culture, blood (x 2)     Status: None   Collection Time: 04/05/19 11:17 AM   Specimen: BLOOD  Result Value Ref Range Status   Specimen Description BLOOD LEFT ANTECUBITAL  Final   Special Requests   Final    BOTTLES DRAWN AEROBIC AND ANAEROBIC Blood Culture adequate volume  Culture   Final    NO GROWTH 5 DAYS Performed at [facility], [address] [place], [place] [address]    Report Status 04/10/2019 FINAL  Final  Culture, blood (x 2)     Status: None   Collection Time: 04/05/19 11:25 AM   Specimen: BLOOD  Result Value Ref Range Status   Specimen Description BLOOD RIGHT ANTECUBITAL  Final   Special Requests   Final    BOTTLES DRAWN AEROBIC AND ANAEROBIC Blood Culture adequate volume   Culture   Final    NO GROWTH 5 DAYS Performed at [facility], [address], [place], [place] [address]    Report Status 04/10/2019 FINAL  Final  SARS CORONAVIRUS 2 (TAT 6-24 HRS) Nasopharyngeal Nasopharyngeal Swab     Status: None   Collection Time: 04/08/19  2:04 PM   Specimen: Nasopharyngeal Swab  Result Value Ref Range Status   SARS Coronavirus 2 NEGATIVE NEGATIVE Final    Comment: (NOTE) SARS-CoV-2 target nucleic acids are NOT DETECTED. The SARS-CoV-2 RNA is generally detectable in upper and lower respiratory specimens during the acute phase of infection. Negative results do not preclude SARS-CoV-2 infection, do not rule out co-infections with other pathogens, and should not be used as the sole basis for treatment or other patient management decisions. Negative results must be combined with clinical observations, patient history, and epidemiological information. The expected result is  Negative. Fact Sheet for Patients: SugarRoll.be Fact Sheet for Healthcare Providers: https://www.woods-mathews.com/ This test is not yet approved or cleared by the [place] FDA and  has been authorized for detection and/or diagnosis of SARS-CoV-2 by FDA under an Emergency Use Authorization (EUA). This EUA will remain  in effect (meaning this test can be used) for the duration of the COVID-19 declaration under Section 56 4(b)(1) of the Act, 21 U.S.C. section 360bbb-3(b)(1), unless the authorization is terminated or revoked sooner. Performed at [facility], [place] [address], [place], [place] [address]   CULTURE, BLOOD (ROUTINE X 2) w Reflex to ID Panel     Status: None (Preliminary result)   Collection Time: 04/10/19 11:55 PM   Specimen: BLOOD  Result Value Ref Range Status   Specimen Description BLOOD RIGHT ANTECUBITAL  Final   Special Requests   Final    BOTTLES DRAWN AEROBIC AND ANAEROBIC Blood Culture adequate volume   Culture   Final    NO GROWTH 1 DAY Performed at [facility], [address], [place], [place] [address]    Report Status PENDING  Incomplete  CULTURE, BLOOD (ROUTINE X 2) w Reflex to ID Panel     Status: None (Preliminary result)   Collection Time: 04/10/19 11:59 PM   Specimen: BLOOD  Result Value Ref Range Status   Specimen Description BLOOD RIGHT ARM  Final   Special Requests   Final    BOTTLES DRAWN AEROBIC AND ANAEROBIC Blood Culture adequate volume   Culture   Final    NO GROWTH 1 DAY Performed at [facility], [address] [place], [place] [address]    Report Status PENDING  Incomplete  Urine Culture     Status: None   Collection Time: 04/11/19  2:00 AM   Specimen: Urine, Random  Result Value Ref Range Status   Specimen Description   Final    URINE, RANDOM Performed at [facility], [address] [place], [place] [address]    Special Requests   Final    NONE  Performed at [facility], [address], [place], [place] [address]    Culture   Final    NO GROWTH Performed at [facility], [place] [address], [place],  [address]    Report Status 04/12/2019 FINAL  Final     RN Pressure Injury Documentation: Pressure Injury 04/03/19 Sacrum (Active)  04/03/19 1653  Location: Sacrum  Location Orientation:   Staging:   Wound Description (Comments):   Present on Admission: Yes     Pressure Injury Heel Left;Posterior;Right Unstageable - Full thickness tissue loss in which the base of the ulcer is covered by slough (yellow, tan, gray, green or brown) and/or eschar (tan, brown or black) in the wound bed. (Active)     Location: Heel  Location Orientation: Left;Posterior;Right  Staging: Unstageable - Full thickness tissue loss in which the base of the ulcer is covered by slough (yellow, tan, gray, green or brown) and/or eschar (tan, brown or black) in the wound bed.  Wound Description (Comments):   Present on Admission:      Pressure Injury 04/07/19 Thigh Right;Medial Stage II -  Partial thickness loss of dermis presenting as a shallow open ulcer with a red, pink wound bed without slough. (Active)  04/07/19 1309  Location: Thigh  Location Orientation: Right;Medial  Staging: Stage II -  Partial thickness loss of dermis presenting as a shallow open ulcer with a red, pink wound bed without slough.  Wound Description (Comments):   Present on Admission: No       Radiology Studies: Ct Head Wo Contrast  Result Date: 04/11/2019 CLINICAL DATA:  Encephalopathy EXAM: CT HEAD WITHOUT CONTRAST TECHNIQUE: Contiguous axial images were obtained from the base of the skull through the vertex without intravenous contrast. COMPARISON:  05/23/2015 FINDINGS: Technical note: Examination is significantly degraded by patient motion artifact. Patient was unable to follow instructions while in CT. Examination was repeated a total of 3 times with  persistent artifact on all series which degrades image quality. Brain: No obvious large territory acute infarction or area of intracranial hemorrhage identified on these limited images. Mild prominence of the ventricles suggests mild cerebral atrophy, this is stable from prior. Vascular: Limited. Skull: No obvious skull fracture or bone lesion. Sinuses/Orbits: Grossly intact. Other: None. IMPRESSION: 1. Markedly limited exam secondary to patient motion artifact related to patient condition. 2. No obvious acute findings identified within the limitations of this exam. A repeat study when patient is able to cooperate is recommended. Electronically Signed   By: Davina Poke M.D.   On: 04/11/2019 17:50   Dg Chest Port 1 View  Result Date: 04/11/2019 CLINICAL DATA:  Fever. EXAM: PORTABLE CHEST 1 VIEW COMPARISON:  Radiograph 02/20/2018 FINDINGS: Lung volumes are low.The cardiomediastinal contours are normal. Mild streaky left lung base atelectasis. Pulmonary vasculature is normal. No consolidation, pleural effusion, or pneumothorax. No acute osseous abnormalities are seen. Chronic changes about the shoulders. Remote left rib fracture. IMPRESSION: Low lung volumes with mild left basilar atelectasis. No consolidation to suggest pneumonia. Electronically Signed   By: Keith Rake M.D.   On: 04/11/2019 00:44        Scheduled Meds: . aspirin  81 mg Oral Daily  . Chlorhexidine Gluconate Cloth  6 each Topical Daily  . folic acid  1 mg Oral Daily  . gabapentin  200 mg Oral TID  . lamoTRIgine  200 mg Oral BID  . lamoTRIgine  25 mg Oral BID  . loratadine  10 mg Oral Daily  .  mouth rinse  15 mL Mouth Rinse BID  . metoprolol tartrate  12.5 mg Oral BID  . pantoprazole  40 mg Oral Daily  . polyethylene glycol  17 g Oral Once  . sodium chloride flush  3 mL Intravenous Q12H  . Tbo-Filgrastim  300 mcg Subcutaneous Daily  . traZODone  50 mg Oral QHS   Continuous Infusions: . sodium chloride 75 mL/hr at  04/12/19 1015  . sodium chloride 10 mL/hr at 04/12/19 0807  . meropenem (MERREM) IV 1 g (04/12/19 1507)  . vancomycin 1,750 mg (04/12/19 0123)     LOS: 11 days    Time spent: 38 min     Ivor Costa, DO [facility] PAGER is on [place]  If 7PM-7AM, please contact night-coverage [email or web address] Password [facility] 04/12/2019, 4:22 PM

## 2019-04-12 NOTE — Progress Notes (Signed)
Hematology/Oncology Consult note [facility]  Telephone:([phone] [phone]  Patient Care Team: Derinda Late, MD as PCP - General (Family Medicine)   Name of the patient: Jeremiah [name]  [id number]  1940-06-06   Date of visit: 04/12/2019   Interval history- he is more awake and alert today and in good spirits. No fever in the last 24 hours  ECOG PS- 4 Pain scale- 0   Review of systems- Review of Systems  Constitutional: Positive for malaise/fatigue. Negative for chills, fever and weight loss.  HENT: Negative for congestion, ear discharge and nosebleeds.   Eyes: Negative for blurred vision.  Respiratory: Negative for cough, hemoptysis, sputum production, shortness of breath and wheezing.   Cardiovascular: Negative for chest pain, palpitations, orthopnea and claudication.  Gastrointestinal: Negative for abdominal pain, blood in stool, constipation, diarrhea, heartburn, melena, nausea and vomiting.  Genitourinary: Negative for dysuria, flank pain, frequency, hematuria and urgency.  Musculoskeletal: Negative for back pain, joint pain and myalgias.  Skin: Negative for rash.  Neurological: Negative for dizziness, tingling, focal weakness, seizures, weakness and headaches.  Endo/Heme/Allergies: Does not bruise/bleed easily.  Psychiatric/Behavioral: Negative for depression and suicidal ideas. The patient does not have insomnia.       Allergies  Allergen Reactions   Cefoperazone Anaphylaxis   Penicillins Anaphylaxis and Other (See Comments)    Has patient had a PCN reaction causing immediate rash, facial/tongue/throat swelling, SOB or lightheadedness with hypotension: Yes Has patient had a PCN reaction causing severe rash involving mucus membranes or skin necrosis: No Has patient had a PCN reaction that required hospitalization No Has patient had a PCN reaction occurring within the last 10 years: No If all of the above answers are "NO",  then may proceed with Cephalosporin use.   Latex Rash     Past Medical History:  Diagnosis Date   Allergy    Arthritis    Hypertension    Rheumatoid arthritis ([place])    Seizures ([place])    Sepsis ([place]) 05/23/2015     Past Surgical History:  Procedure Laterality Date   back surgery five times  04/2016   CARPAL TUNNEL RELEASE     HERNIA REPAIR     JOINT REPLACEMENT     knee replacement right     right hip replacement     rotator cuff surgery     SPINE SURGERY      Social History   Socioeconomic History   Marital status: Married    Spouse name: Not on file   Number of children: Not on file   Years of education: Not on file   Highest education level: Not on file  Occupational History   Not on file  Social Needs   Financial resource strain: Not on file   Food insecurity    Worry: Not on file    Inability: Not on file   Transportation needs    Medical: Not on file    Non-medical: Not on file  Tobacco Use   Smoking status: Former Smoker    Packs/day: 1.00    Years: 20.00    Pack years: 20.00    Types: Cigarettes   Smokeless tobacco: Never Used  Substance and Sexual Activity   Alcohol use: No   Drug use: Not on file   Sexual activity: Not on file  Lifestyle   Physical activity    Days per week: Not on file    Minutes per session: Not on file   Stress:  Not on file  Relationships   Social connections    Talks on phone: Not on file    Gets together: Not on file    Attends religious service: Not on file    Active member of club or organization: Not on file    Attends meetings of clubs or organizations: Not on file    Relationship status: Not on file   Intimate partner violence    Fear of current or ex partner: Not on file    Emotionally abused: Not on file    Physically abused: Not on file    Forced sexual activity: Not on file  Other Topics Concern   Not on file  Social History Narrative   Not on file    Family History   Problem Relation Age of Onset   Hypertension Other    Hypertension Mother    Dementia Mother    Dementia Father    Prostate cancer Neg Hx    Bladder Cancer Neg Hx    Kidney cancer Neg Hx      Current Facility-Administered Medications:    0.45 % sodium chloride infusion, , Intravenous, Continuous, Ivor Costa, MD, Last Rate: 75 mL/hr at 04/12/19 1015   0.9 %  sodium chloride infusion, , Intravenous, PRN, Lavina Hamman, MD, Last Rate: 10 mL/hr at 04/12/19 0807   acetaminophen (TYLENOL) tablet 650 mg, 650 mg, Oral, Q6H PRN, 650 mg at 04/10/19 2034 **OR** acetaminophen (TYLENOL) suppository 650 mg, 650 mg, Rectal, Q6H PRN, Lavina Hamman, MD   aspirin chewable tablet 81 mg, 81 mg, Oral, Daily, Lavina Hamman, MD, 81 mg at 04/12/19 1002   Chlorhexidine Gluconate Cloth 2 % PADS 6 each, 6 each, Topical, Daily, Lavina Hamman, MD, 6 each at 04/11/19 1771   fentaNYL (SUBLIMAZE) injection, , Intravenous, PRN, Aletta Edouard, MD, 25 mcg at 16/57/90 3833   folic acid (FOLVITE) tablet 1 mg, 1 mg, Oral, Daily, Corcoran, Melissa C, MD, 1 mg at 04/12/19 1003   gabapentin (NEURONTIN) capsule 300 mg, 300 mg, Oral, QID, Spongberg, Audie Pinto, MD, 300 mg at 04/12/19 1501   lamoTRIgine (LAMICTAL) tablet 200 mg, 200 mg, Oral, BID, Lavina Hamman, MD, 200 mg at 04/12/19 1002   lamoTRIgine (LAMICTAL) tablet 25 mg, 25 mg, Oral, BID, Lavina Hamman, MD, 25 mg at 04/12/19 1003   lip balm (BLISTEX) ointment, , Topical, PRN, Spongberg, Audie Pinto, MD   loratadine (CLARITIN) tablet 10 mg, 10 mg, Oral, Daily, Ivor Costa, MD, 10 mg at 04/12/19 1002   LORazepam (ATIVAN) injection 1 mg, 1 mg, Intravenous, Q12H PRN, Ivor Costa, MD, 1 mg at 04/11/19 0003   magic mouthwash w/lidocaine, 5 mL, Oral, TID PRN, Ivor Costa, MD   MEDLINE mouth rinse, 15 mL, Mouth Rinse, BID, Lavina Hamman, MD, 15 mL at 04/12/19 1009   meropenem (MERREM) 1 g in sodium chloride 0.9 % 100 mL IVPB, 1 g,  Intravenous, Q8H, Ravishankar, Jayashree, MD, Last Rate: 200 mL/hr at 04/12/19 1507, 1 g at 04/12/19 1507   metoprolol tartrate (LOPRESSOR) tablet 12.5 mg, 12.5 mg, Oral, BID, Spongberg, Audie Pinto, MD, 12.5 mg at 04/12/19 1002   midazolam (VERSED) injection, , Intravenous, PRN, Aletta Edouard, MD, 1 mg at 04/10/19 0854   ondansetron (ZOFRAN) tablet 4 mg, 4 mg, Oral, Q6H PRN **OR** ondansetron (ZOFRAN) injection 4 mg, 4 mg, Intravenous, Q6H PRN, Lavina Hamman, MD   oxyCODONE (Oxy IR/ROXICODONE) immediate release tablet 5 mg, 5 mg, Oral, Q6H PRN, Posey Pronto,  Josetta Huddle, MD, 5 mg at 04/12/19 1501   pantoprazole (PROTONIX) EC tablet 40 mg, 40 mg, Oral, Daily, Lavina Hamman, MD, Stopped at 04/12/19 1004   polyethylene glycol (MIRALAX / GLYCOLAX) packet 17 g, 17 g, Oral, Daily PRN, Lavina Hamman, MD   polyethylene glycol (MIRALAX / GLYCOLAX) packet 17 g, 17 g, Oral, Once, Lavina Hamman, MD   sodium chloride flush (NS) 0.9 % injection 3 mL, 3 mL, Intravenous, Q12H, Lavina Hamman, MD, 3 mL at 04/12/19 1003   Tbo-Filgrastim (GRANIX) injection 300 mcg, 300 mcg, Subcutaneous, Daily, Sindy Guadeloupe, MD, 300 mcg at 04/12/19 1001   traZODone (DESYREL) tablet 50 mg, 50 mg, Oral, QHS, Lavina Hamman, MD, 50 mg at 04/11/19 2325   vancomycin (VANCOCIN) 1,750 mg in sodium chloride 0.9 % 500 mL IVPB, 1,750 mg, Intravenous, Q24H, Ivor Costa, MD, Last Rate: 250 mL/hr at 04/12/19 0123, 1,750 mg at 04/12/19 0123  Physical exam:  Vitals:   04/12/19 0138 04/12/19 0228 04/12/19 0355 04/12/19 1140  BP: 111/70  119/82 (!) 97/54  Pulse: 100  99 89  Resp: '18  20 17  '$ Temp: 99.2 F (37.3 C)  99.7 F (37.6 C) 98.5 F (36.9 C)  TempSrc: Oral  Oral Oral  SpO2: 100%  97% 95%  Weight:      Height:  6' (1.829 m)     Physical Exam HENT:     Head: Normocephalic and atraumatic.     Mouth/Throat:     Comments: Dried blood/ crusted lesions over lips appear to be healing Eyes:     Pupils: Pupils are equal,  round, and reactive to light.  Neck:     Musculoskeletal: Normal range of motion.  Cardiovascular:     Rate and Rhythm: Normal rate and regular rhythm.     Heart sounds: Normal heart sounds.  Pulmonary:     Effort: Pulmonary effort is normal.     Breath sounds: Normal breath sounds.  Abdominal:     General: Bowel sounds are normal.     Palpations: Abdomen is soft.  Musculoskeletal:     Left lower leg: Edema present.  Skin:    General: Skin is warm and dry.  Neurological:     Mental Status: He is alert.     Comments: Oriented to self and place      CMP Latest Ref Rng & Units 04/12/2019  Glucose 70 - 99 mg/dL 75  BUN 8 - 23 mg/dL 14  Creatinine 0.61 - 1.24 mg/dL 1.09  Sodium 135 - 145 mmol/L 148(H)  Potassium 3.5 - 5.1 mmol/L 3.8  Chloride 98 - 111 mmol/L 117(H)  CO2 22 - 32 mmol/L 24  Calcium 8.9 - 10.3 mg/dL 8.3(L)  Total Protein 6.5 - 8.1 g/dL -  Total Bilirubin 0.3 - 1.2 mg/dL -  Alkaline Phos 38 - 126 U/L -  AST 15 - 41 U/L -  ALT 0 - 44 U/L -   CBC Latest Ref Rng & Units 04/12/2019  WBC 4.0 - 10.5 K/uL 1.9(L)  Hemoglobin 13.0 - 17.0 g/dL 8.9(L)  Hematocrit 39.0 - 52.0 % 28.3(L)  Platelets 150 - 400 K/uL 94(L)    '@IMAGES'$ @  Ct Head Wo Contrast  Result Date: 04/11/2019 CLINICAL DATA:  Encephalopathy EXAM: CT HEAD WITHOUT CONTRAST TECHNIQUE: Contiguous axial images were obtained from the base of the skull through the vertex without intravenous contrast. COMPARISON:  05/23/2015 FINDINGS: Technical note: Examination is significantly degraded by patient motion artifact. Patient was  unable to follow instructions while in CT. Examination was repeated a total of 3 times with persistent artifact on all series which degrades image quality. Brain: No obvious large territory acute infarction or area of intracranial hemorrhage identified on these limited images. Mild prominence of the ventricles suggests mild cerebral atrophy, this is stable from prior. Vascular: Limited. Skull:  No obvious skull fracture or bone lesion. Sinuses/Orbits: Grossly intact. Other: None. IMPRESSION: 1. Markedly limited exam secondary to patient motion artifact related to patient condition. 2. No obvious acute findings identified within the limitations of this exam. A repeat study when patient is able to cooperate is recommended. Electronically Signed   By: Davina Poke M.D.   On: 04/11/2019 17:50   US Renal  Result Date: 04/01/2019 CLINICAL DATA:  Acute kidney injury EXAM: RENAL / URINARY TRACT ULTRASOUND COMPLETE COMPARISON:  None. FINDINGS: Right Kidney: Renal measurements: 11.1 x 4.3 x 4.5 cm = volume: 110 mL . Echogenicity within normal limits. No mass or hydronephrosis visualized. Left Kidney: Renal measurements: 11.3 x 5.6 x 4.7 cm = volume: 155 mL. Echogenicity within normal limits. There is a thin walled anechoic simple cyst at the mid to inferior pole the left kidney measuring 2.1 x 1.6 x 2.2 cm. No solid mass or hydronephrosis visualized. Bladder: Decompressed with Foley catheter in place. Other: None. IMPRESSION: Negative for obstructive uropathy. Electronically Signed   By: Davina Poke M.D.   On: 04/01/2019 10:11   Dg Chest Port 1 View  Result Date: 04/11/2019 CLINICAL DATA:  Fever. EXAM: PORTABLE CHEST 1 VIEW COMPARISON:  Radiograph 02/20/2018 FINDINGS: Lung volumes are low.The cardiomediastinal contours are normal. Mild streaky left lung base atelectasis. Pulmonary vasculature is normal. No consolidation, pleural effusion, or pneumothorax. No acute osseous abnormalities are seen. Chronic changes about the shoulders. Remote left rib fracture. IMPRESSION: Low lung volumes with mild left basilar atelectasis. No consolidation to suggest pneumonia. Electronically Signed   By: Keith Rake M.D.   On: 04/11/2019 00:44   Ct Bone Marrow Biopsy & Aspiration  Result Date: 04/10/2019 CLINICAL DATA:  Pancytopenia and need for bone marrow biopsy. EXAM: CT GUIDED BONE MARROW ASPIRATION  AND BIOPSY ANESTHESIA/SEDATION: Versed 1.0 mg IV, Fentanyl 50 mcg IV Total Moderate Sedation Time:   18 minutes. The patient's level of consciousness and physiologic status were continuously monitored during the procedure by Radiology nursing. PROCEDURE: The procedure risks, benefits, and alternatives were explained to the patient's son. Questions regarding the procedure were encouraged and answered. The patient's son understands and consents to the procedure. A time out was performed prior to initiating the procedure. The right gluteal region was prepped with chlorhexidine. Sterile gown and sterile gloves were used for the procedure. Local anesthesia was provided with 1% Lidocaine. Under CT guidance, an 11 gauge On Control bone cutting needle was advanced from a posterior approach into the right iliac bone. Needle positioning was confirmed with CT. Initial non heparinized and heparinized aspirate samples were obtained of bone marrow. Core biopsy was performed via the On Control drill needle. COMPLICATIONS: None FINDINGS: Inspection of initial aspirate did reveal visible particles. Intact core biopsy sample was obtained. IMPRESSION: CT guided bone marrow biopsy of right posterior iliac bone with both aspirate and core samples obtained. Electronically Signed   By: Aletta Edouard M.D.   On: 04/10/2019 11:14     Assessment and plan- Patient is a 78 y.o. male admitted for b/l cellulitis complicated by pancytopenia likely due to MTX and neutropenic fever  1. Pancytopenia:  Thrombocytopenia- continues to improve. Platelet count up to 94 roday Anemia- iron studies also suggestive of anemia of chronic disease. Likely due to MTX. Stable continue to monitor. Continue PO folic acid Neutropenia: severe. anc 100. Continue neupogen  Awaiting results of bone marrow biopsy. 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    2. Neutropenic fever- on meropenem and vancomycin    Visit Diagnosis 1.  Cellulitis of right lower extremity   2. Lymphedema   3. AKI (acute kidney injury) ([place])   4. Folic acid deficiency   5. Pancytopenia ([place])   6. Fever   7. Neutropenia with fever ([place])      Dr. Randa Evens, MD, MPH [facility] [phone] 04/12/2019 3:18 PM

## 2019-04-12 NOTE — Progress Notes (Signed)
NP Ouma aware of patient's vital signs 102.9 and HR of 123 with MEWS score of 4.

## 2019-04-13 ENCOUNTER — Other Ambulatory Visit: Payer: Self-pay

## 2019-04-13 DIAGNOSIS — Z960 Presence of urogenital implants: Secondary | ICD-10-CM

## 2019-04-13 DIAGNOSIS — K1230 Oral mucositis (ulcerative), unspecified: Secondary | ICD-10-CM

## 2019-04-13 DIAGNOSIS — L89519 Pressure ulcer of right ankle, unspecified stage: Secondary | ICD-10-CM

## 2019-04-13 DIAGNOSIS — L89529 Pressure ulcer of left ankle, unspecified stage: Secondary | ICD-10-CM

## 2019-04-13 LAB — CBC WITH DIFFERENTIAL/PLATELET
Abs Immature Granulocytes: 0.06 10*3/uL (ref 0.00–0.07)
Basophils Absolute: 0 10*3/uL (ref 0.0–0.1)
Basophils Relative: 1 %
Eosinophils Absolute: 0.4 10*3/uL (ref 0.0–0.5)
Eosinophils Relative: 11 %
HCT: 26.4 % — ABNORMAL LOW (ref 39.0–52.0)
Hemoglobin: 8.6 g/dL — ABNORMAL LOW (ref 13.0–17.0)
Immature Granulocytes: 2 %
Lymphocytes Relative: 43 %
Lymphs Abs: 1.6 10*3/uL (ref 0.7–4.0)
MCH: 27.9 pg (ref 26.0–34.0)
MCHC: 32.6 g/dL (ref 30.0–36.0)
MCV: 85.7 fL (ref 80.0–100.0)
Monocytes Absolute: 0.8 10*3/uL (ref 0.1–1.0)
Monocytes Relative: 21 %
Neutro Abs: 0.8 10*3/uL — ABNORMAL LOW (ref 1.7–7.7)
Neutrophils Relative %: 22 %
Platelets: 192 10*3/uL (ref 150–400)
RBC: 3.08 MIL/uL — ABNORMAL LOW (ref 4.22–5.81)
RDW: 18.4 % — ABNORMAL HIGH (ref 11.5–15.5)
Smear Review: UNDETERMINED
WBC: 3.6 10*3/uL — ABNORMAL LOW (ref 4.0–10.5)
nRBC: 2.5 % — ABNORMAL HIGH (ref 0.0–0.2)

## 2019-04-13 LAB — BASIC METABOLIC PANEL
Anion gap: 7 (ref 5–15)
BUN: 16 mg/dL (ref 8–23)
CO2: 23 mmol/L (ref 22–32)
Calcium: 8.1 mg/dL — ABNORMAL LOW (ref 8.9–10.3)
Chloride: 116 mmol/L — ABNORMAL HIGH (ref 98–111)
Creatinine, Ser: 1.1 mg/dL (ref 0.61–1.24)
GFR calc Af Amer: >60 mL/min (ref >60)
GFR calc non Af Amer: >60 mL/min (ref >60)
Glucose, Bld: 88 mg/dL (ref 70–99)
Potassium: 3.7 mmol/L (ref 3.5–5.1)
Sodium: 146 mmol/L — ABNORMAL HIGH (ref 135–145)

## 2019-04-13 LAB — LACTIC ACID, PLASMA
Lactic Acid, Venous: 1.6 mmol/L (ref 0.5–1.9)
Lactic Acid, Venous: 1.6 mmol/L (ref 0.5–1.9)

## 2019-04-13 LAB — MAGNESIUM: Magnesium: 2 mg/dL (ref 1.7–2.4)

## 2019-04-13 LAB — PROCALCITONIN: Procalcitonin: 0.38 ng/mL

## 2019-04-13 LAB — PARVOVIRUS B19 ANTIBODY, IGG AND IGM
Parovirus B19 IgG Abs: 3.5 index — ABNORMAL HIGH (ref 0.0–0.8)
Parovirus B19 IgM Abs: 0.2 index (ref 0.0–0.8)

## 2019-04-13 MED ORDER — ADULT MULTIVITAMIN W/MINERALS CH
1.0000 | ORAL_TABLET | Freq: Every day | ORAL | Status: DC
  Administered 2019-04-14 – 2019-04-19 (×6): 1 via ORAL
  Filled 2019-04-13 (×6): qty 1

## 2019-04-13 MED ORDER — SODIUM CHLORIDE 0.9 % IV SOLN
INTRAVENOUS | Status: DC
  Administered 2019-04-13 (×2): via INTRAVENOUS

## 2019-04-13 MED ORDER — ENSURE ENLIVE PO LIQD
237.0000 mL | Freq: Three times a day (TID) | ORAL | Status: DC
  Administered 2019-04-13 – 2019-04-19 (×15): 237 mL via ORAL

## 2019-04-13 MED ORDER — SODIUM CHLORIDE 0.9 % IV BOLUS
1000.0000 mL | Freq: Once | INTRAVENOUS | Status: AC
  Administered 2019-04-13: 1000 mL via INTRAVENOUS

## 2019-04-13 MED ORDER — VITAMIN C 500 MG PO TABS
500.0000 mg | ORAL_TABLET | Freq: Two times a day (BID) | ORAL | Status: DC
  Administered 2019-04-14 – 2019-04-19 (×13): 500 mg via ORAL
  Filled 2019-04-13 (×13): qty 1

## 2019-04-13 NOTE — Progress Notes (Signed)
[facility] Hematology/Oncology Progress Note  Date of admission: 03/31/2019  Hospital day:  04/13/2019  Chief Complaint: Jeremiah [name]. is a 78 y.o. male with rheumatoid arthritis and venous stasis/chronic lymphedema who was admitted through the ER with bilateral lower extremity cellultis.  Subjective:  Patient states that his mouth feels better.    Social History: The patient is accompanied by his wife today.  Allergies:  Allergies  Allergen Reactions  . Cefoperazone Anaphylaxis  . Penicillins Anaphylaxis and Other (See Comments)    Has patient had a PCN reaction causing immediate rash, facial/tongue/throat swelling, SOB or lightheadedness with hypotension: Yes Has patient had a PCN reaction causing severe rash involving mucus membranes or skin necrosis: No Has patient had a PCN reaction that required hospitalization No Has patient had a PCN reaction occurring within the last 10 years: No If all of the above answers are "NO", then may proceed with Cephalosporin use.  . Latex Rash    Scheduled Medications: . aspirin  81 mg Oral Daily  . Chlorhexidine Gluconate Cloth  6 each Topical Daily  . feeding supplement (ENSURE ENLIVE)  237 mL Oral TID BM  . folic acid  1 mg Oral Daily  . gabapentin  200 mg Oral TID  . lamoTRIgine  200 mg Oral BID  . lamoTRIgine  25 mg Oral BID  . loratadine  10 mg Oral Daily  . mouth rinse  15 mL Mouth Rinse BID  . metoprolol tartrate  12.5 mg Oral BID  . [START ON 04/14/2019] multivitamin with minerals  1 tablet Oral Daily  . pantoprazole  40 mg Oral Daily  . polyethylene glycol  17 g Oral Once  . sodium chloride flush  3 mL Intravenous Q12H  . Tbo-Filgrastim  300 mcg Subcutaneous Daily  . traZODone  50 mg Oral QHS  . vitamin C  500 mg Oral BID    Review of Systems: GENERAL:  Feels better.  Tmax 100.3 at 12:40am today.  No sweats. PERFORMANCE STATUS (ECOG):  3 HEENT:  Lips remain chapped, less tender.  Mouth feels  better.  No visual changes or runny nose. Lungs: No shortness of breath or cough.  No hemoptysis. Cardiac:  No chest pain, palpitations, orthopnea, or PND. GI:  Eating some soup.  No nausea, vomiting, diarrhea, constipation, melena or hematochezia. GU:  Foley catheter remains in place.  No urgency, frequency, dysuria, or hematuria. Musculoskeletal:  Patient not complaining of back pain today.  No joint pain.  No muscle tenderness. Extremities:  Lower extremity swelling improved with wrapping. Skin:  No new changes. Neuro:  No headache, numbness or weakness, balance or coordination issues. Endocrine:  No diabetes, thyroid issues, hot flashes or night sweats. Psych:  No mood changes, depression or anxiety. Pain: Oral pain, improved. Review of systems:  All other systems reviewed and found to be negative.   Physical Exam: Blood pressure 117/61, pulse 100, temperature (!) 97.5 F (36.4 C), temperature source Oral, resp. rate 18, height 6' (1.829 m), weight 260 lb 9.3 oz (118.2 kg), SpO2 100 %.  GENERAL:  Well developed, well nourished, elderly gentleman lying comfortably on an air mattress in no acute distress. MENTAL STATUS:  Alert and oriented to person only.  Answers questions appropriaely. HEAD:  Near alopecia.  Normocephalic, atraumatic, face symmetric, no Cushingoid features. EYES:  Blue eyes.  Pupils equal round and reactive to light and accomodation.  No conjunctivitis or scleral icterus. ENT:  Lips with dried blood.  Sublingual ulceration,  improved.  Cheek ulceration, resolved.  Tongue normal. No thrush.  RESPIRATORY:  Clear to auscultation anteriorly without rales, wheezes or rhonchi. CARDIOVASCULAR:  Regular rate and rhythm without murmur, rub or gallop. ABDOMEN:  Soft, non-tender with active bowel sounds and no appreciable hepatosplenomegaly.  No masses. SKIN:  Lower legs wrapped. EXTREMITIES: Improving bilateral lower extremity edema.  No palpable cords. NEUROLOGICAL: Interactive.  Remains mildly confused. PSYCH:  Appropriate.    Results for orders placed or performed during the hospital encounter of 03/31/19 (from the past 48 hour(s))  CBC with Differential     Status: Abnormal   Collection Time: 04/12/19  5:04 AM  Result Value Ref Range   WBC 1.9 (L) 4.0 - 10.5 K/uL   RBC 3.16 (L) 4.22 - 5.81 MIL/uL   Hemoglobin 8.9 (L) 13.0 - 17.0 g/dL   HCT 28.3 (L) 39.0 - 52.0 %   MCV 89.6 80.0 - 100.0 fL   MCH 28.2 26.0 - 34.0 pg   MCHC 31.4 30.0 - 36.0 g/dL   RDW 18.5 (H) 11.5 - 15.5 %   Platelets 94 (L) 150 - 400 K/uL    Comment: Immature Platelet Fraction may be clinically indicated, consider ordering this additional test [id number]    nRBC 2.1 (H) 0.0 - 0.2 %   Neutrophils Relative % 3 %   Neutro Abs 0.1 (L) 1.7 - 7.7 K/uL   Lymphocytes Relative 59 %   Lymphs Abs 1.1 0.7 - 4.0 K/uL   Monocytes Relative 16 %   Monocytes Absolute 0.3 0.1 - 1.0 K/uL   Eosinophils Relative 19 %   Eosinophils Absolute 0.4 0.0 - 0.5 K/uL   Basophils Relative 0 %   Basophils Absolute 0.0 0.0 - 0.1 K/uL   RBC Morphology MORPHOLOGY UNREMARKABLE    Smear Review Normal platelet morphology    Immature Granulocytes 3 %   Abs Immature Granulocytes 0.06 0.00 - 0.07 K/uL    Comment: Performed at [facility], [address] [place], [place] [address]  Iron and TIBC     Status: Abnormal   Collection Time: 04/12/19  5:04 AM  Result Value Ref Range   Iron 16 (L) 45 - 182 ug/dL   TIBC 119 (L) 250 - 450 ug/dL   Saturation Ratios 14 (L) 17.9 - 39.5 %   UIBC 103 ug/dL    Comment: Performed at [facility], [address] [place], [place] [address]  Ferritin     Status: Abnormal   Collection Time: 04/12/19  5:04 AM  Result Value Ref Range   Ferritin 368 (H) 24 - 336 ng/mL    Comment: Performed at [facility], [address] [place], [place] [id number]  Basic metabolic panel     Status: Abnormal   Collection Time: 04/12/19  5:04 AM  Result Value Ref Range    Sodium 148 (H) 135 - 145 mmol/L   Potassium 3.8 3.5 - 5.1 mmol/L   Chloride 117 (H) 98 - 111 mmol/L   CO2 24 22 - 32 mmol/L   Glucose, Bld 75 70 - 99 mg/dL   BUN 14 8 - 23 mg/dL   Creatinine, Ser 1.09 0.61 - 1.24 mg/dL   Calcium 8.3 (L) 8.9 - 10.3 mg/dL   GFR calc non Af Amer >60 >60 mL/min   GFR calc Af Amer >60 >60 mL/min   Anion gap 7 5 - 15    Comment: Performed at [facility], [address], [place], [place] [address]  BNP  Status: None   Collection Time: 04/12/19  5:04 AM  Result Value Ref Range   B Natriuretic Peptide 27.0 0.0 - 100.0 pg/mL    Comment: Performed at [facility], [address] [place], [place] [address]  Ammonia     Status: None   Collection Time: 04/12/19  4:09 PM  Result Value Ref Range   Ammonia 10 9 - 35 umol/L    Comment: Performed at [facility], [address] [place], [place] [address]  CBC with Differential     Status: Abnormal   Collection Time: 04/13/19  4:12 AM  Result Value Ref Range   WBC 3.6 (L) 4.0 - 10.5 K/uL   RBC 3.08 (L) 4.22 - 5.81 MIL/uL   Hemoglobin 8.6 (L) 13.0 - 17.0 g/dL   HCT 26.4 (L) 39.0 - 52.0 %   MCV 85.7 80.0 - 100.0 fL   MCH 27.9 26.0 - 34.0 pg   MCHC 32.6 30.0 - 36.0 g/dL   RDW 18.4 (H) 11.5 - 15.5 %   Platelets 192 150 - 400 K/uL    Comment: REPEATED TO VERIFY   nRBC 2.5 (H) 0.0 - 0.2 %   Neutrophils Relative % 22 %   Neutro Abs 0.8 (L) 1.7 - 7.7 K/uL   Lymphocytes Relative 43 %   Lymphs Abs 1.6 0.7 - 4.0 K/uL   Monocytes Relative 21 %   Monocytes Absolute 0.8 0.1 - 1.0 K/uL   Eosinophils Relative 11 %   Eosinophils Absolute 0.4 0.0 - 0.5 K/uL   Basophils Relative 1 %   Basophils Absolute 0.0 0.0 - 0.1 K/uL   WBC Morphology MILD LEFT SHIFT (1-5% METAS, OCC MYELO, OCC BANDS)    RBC Morphology MIXED RBC POPULATION    Smear Review PLATELET CLUMPS NOTED ON SMEAR, UNABLE TO ESTIMATE    Immature Granulocytes 2 %   Abs Immature Granulocytes 0.06 0.00 - 0.07 K/uL   Polychromasia  PRESENT     Comment: Performed at [facility], [address] [place], [place] [address]  AM - BMET     Status: Abnormal   Collection Time: 04/13/19  4:12 AM  Result Value Ref Range   Sodium 146 (H) 135 - 145 mmol/L   Potassium 3.7 3.5 - 5.1 mmol/L   Chloride 116 (H) 98 - 111 mmol/L   CO2 23 22 - 32 mmol/L   Glucose, Bld 88 70 - 99 mg/dL   BUN 16 8 - 23 mg/dL   Creatinine, Ser 1.10 0.61 - 1.24 mg/dL   Calcium 8.1 (L) 8.9 - 10.3 mg/dL   GFR calc non Af Amer >60 >60 mL/min   GFR calc Af Amer >60 >60 mL/min   Anion gap 7 5 - 15    Comment: Performed at [facility], [address] [place], [place] [address]  AM - Mg     Status: None   Collection Time: 04/13/19  4:12 AM  Result Value Ref Range   Magnesium 2.0 1.7 - 2.4 mg/dL    Comment: Performed at [facility], [address] [place], [place] [address]  Lactic acid, plasma     Status: None   Collection Time: 04/13/19  9:05 AM  Result Value Ref Range   Lactic Acid, Venous 1.6 0.5 - 1.9 mmol/L    Comment: Performed at [facility], [address], [place], [place] [address]  Procalcitonin     Status: None   Collection Time: 04/13/19  9:05 AM  Result Value Ref Range   Procalcitonin 0.38 ng/mL    Comment:        Interpretation: PCT (Procalcitonin) <= 0.5 ng/mL: Systemic infection (sepsis) is not likely. Local bacterial infection is possible. (NOTE)       Sepsis PCT Algorithm           Lower Respiratory Tract                                      Infection PCT Algorithm    ----------------------------     ----------------------------         PCT < 0.25 ng/mL                PCT < 0.10 ng/mL         Strongly encourage             Strongly discourage   discontinuation of antibiotics    initiation of antibiotics    ----------------------------     -----------------------------       PCT 0.25 - 0.50 ng/mL            PCT 0.10 - 0.25 ng/mL               OR       >80% decrease in PCT             Discourage initiation of                                            antibiotics      Encourage discontinuation           of antibiotics    ----------------------------     -----------------------------         PCT >= 0.50 ng/mL              PCT 0.26 - 0.50 ng/mL               AND        <80% decrease in PCT             Encourage initiation of                                             antibiotics       Encourage continuation           of antibiotics    ----------------------------     -----------------------------        PCT >= 0.50 ng/mL                  PCT > 0.50 ng/mL               AND         increase in PCT                  Strongly encourage                                      initiation of antibiotics    Strongly encourage escalation  of antibiotics                                     -----------------------------                                           PCT <= 0.25 ng/mL                                                 OR                                        > 80% decrease in PCT                                     Discontinue / Do not initiate                                             antibiotics Performed at [facility], [name] [place], [place] [address]   Lactic acid, plasma     Status: None   Collection Time: 04/13/19 10:54 AM  Result Value Ref Range   Lactic Acid, Venous 1.6 0.5 - 1.9 mmol/L    Comment: Performed at [facility], [address], [place], [place] [address]   Mr Jeri Cos [id number] Contrast  Result Date: 04/12/2019 CLINICAL DATA:  Encephalopathy. History of hypertension and rheumatoid arthritis. EXAM: MRI HEAD WITHOUT AND WITH CONTRAST TECHNIQUE: Multiplanar, multiecho pulse sequences of the brain and surrounding structures were obtained without and with intravenous contrast. CONTRAST:  41mL GADAVIST GADOBUTROL 1 MMOL/ML IV SOLN COMPARISON:  None. FINDINGS: BRAIN: There is no acute infarct, acute hemorrhage or  extra-axial collection. Early confluent hyperintense T2-weighted signal of the periventricular and deep white matter, most commonly due to chronic ischemic microangiopathy. There is generalized atrophy without lobar predilection. The midline structures are normal. There is no abnormal contrast enhancement. VASCULAR: The major intracranial arterial and venous sinus flow voids are normal. Susceptibility-sensitive sequences show no chronic microhemorrhage or superficial siderosis. SKULL AND UPPER CERVICAL SPINE: Calvarial bone marrow signal is normal. There is no skull base mass. The visualized upper cervical spine and soft tissues are normal. SINUSES/ORBITS: Left-greater-than-right mastoid fluid. No paranasal sinus fluid levels or advanced mucosal thickening. The orbits are normal. IMPRESSION: 1. No acute intracranial abnormality. 2. Generalized atrophy and chronic ischemic microangiopathy. Electronically Signed   By: Ulyses Jarred M.D.   On: 04/12/2019 19:56    CBCs: 03/26/2019: Hematocrit 33.4.  Hemoglobin 10.7.  MCV 89.0.  Platelets 128,000, WBC 3700. 03/31/2019: Hematocrit 30.5.  Hemoglobin 10.0.  MCV 86.4.  Platelets 206,000.  WBC 3000 (ANC 1600). 04/01/2019: Hematocrit 26.0.  Hemoglobin 8.5.  MCV 85.2.  Platelets 165,000.  WBC 2900.  04/04/2019: Hematocrit 27.8.  Hemoglobin 9.2.  MCV 85.3.  Platelets 107,000.  WBC 2200 (ANC1500).  04/05/2019: Hematocrit 27.0.  Hemoglobin 8.9.  MCV 85.2.  Platelets 81,000.  WBC 1000 ([place] 200).       04/06/2019: Hematocrit 27.8.  Hemoglobin 9.0.  MCV 86.1.  Platelets 60,000.  WBC 1700 (ANC 1300).  04/07/2019: Hematocrit 27.6.  Hemoglobin 8.6.  MCV 90.2.  Platelets 55,000.  WBC 2300 (ANC 1400). 04/08/2019: Hematocrit 29.7.  Hemoglobin 9.2.  MCV 90.5.  Platelets 51,000.  WBC 1600 ([place] 700). 04/09/2019: Hematocrit 28.7.  Hemoglobin 9.3.  MCV 87.2.  Platelets 33,000.  WBC 1500 ([place] 400). 04/10/2019: Hematocrit 31.0.  Hemoglobin 9.7.  MCV 90.6.  Platelets 23,000.  WBC 1500  ([place] 200). 04/11/2019: Hematocrit 28.8.  Hemoglobin 9.1.  MCV 89.4.  Platelets 47,000.  WBC 1400 (ANC 100). 04/12/2019: Hematocrit 28.3.  Hemoglobin 8.9.  MCV 89.6.  Platelets 94,000.  WBC 1900 (ANC 100). 04/13/2019: Hematocrit 26.4.  Hemoglobin 8.6.  MCV 85.7.  Platelets 192,000.  WBC 3600 ([place] 800).   Assessment:  Jeremiah [name]. is a 78 y.o. male with rheumatoid arthritis who was admitted through the ER with bilateral lower extremity cellulitis unresponsive to outpatient management.  Cultures remain negative to date.  He was initially treated in the outpatient department with ciprofloxacin and clindamycin.  On admission he was switched to vancomycin and then doxycycline 04/03/2019.  He was rheumatoid arthritis and receives oral MTX weekly, Enbrel SQ weekly on Fridays, and Plaquenil.  Creatinine was 3.12 on admission and is currently 1.10.  Peripheral smear reveals absolute leukopenia with neutropenia and ANC of 400. RBC are normochromic, normocytic anemia with mild anisocytosis. There is no significant polychromasia, and low reticulocyte count, indicating RBC hypoproliferation. There is no increase in schistocytes, less than 0.5% of RBCs.  There is thrombocytopenia, with unremarkable morphology. Etiology is likely due to decreased marrow production is favored.   He has folic acid deficiency.  Folic acid was 3.4 on [id number].  Per patient's report, he stopped folic acid about 1 month ago.  GCSF (Granix) began on 04/11/2019.  Symptomatically, he notes improvement in oral pain.  ANC is 800 (improved).    Plan: 1.   Pancytopenia  Anemia   Hemoglobin is 8.6.     Retic count was < 0.4% (low) on 04/05/2019 and 0.5 (still low) on 04/08/2019.   No bleeding per patient's wife.   No evidence of hemolysis.  Coombs negative.  LDH slightly elevated.   B12 was 606.  Folate was 3.4 (low).  TSH was 3.279.   Folic acid began [id number].   Ferritin 368 with an iron saturation of 14% and a TIBC 119  on 04/12/2019.  Thrombocytopenia   Platelet count improving daily and is now 192,000 (normal).   Patient off Lovenox.    Consider reinstitution of DVT prophylaxis.   No evidence of heparin induced thrombocytopenia    Heparin induced platelet antibody 0.058 (low).    Serotonion release assay is < 1%.   Bone marrow results should be available tomorrow.  Leukopenia   WBC 1500 (ANC 200) to 3600 ([place] 800) with initiation of GCSF on 04/11/2019.   Continue GCSF (Granix) daily.   Continue neutropenic precuations.  Etiology felt due to myelosuppression from methotrexate (MTX) in conjunction with low folate (per patient, he did not take for 1 month).   Patient with improving stomatitis as WBC improves.     MTX level was < 0.02 on 04/07/2019.   Other etiologies include marrow replacement, viral suppression.    Flow cytometry pending.    Parvovirus B19 IgG + and IgM - indicative of an old  infection.    HIV testing negative.             Bone marrow aspirate and biopsy results should be available tomorrow.  Daily CBC with diff.  Continue neutropenic precautions.         2.   Bilateral lower extremity cellulitis  Appreciate ID consult.  Patient with severe lymphedema/venous edema felt mimicking cellulitis.  Patient in compression bandages. 3.   Fever and neutropenia  Patient developed a fever (101.3 axillary) on evening of 04/10/2019.  Blood cultures negative to date.  Patient on vancomycin and imipenem. 4.   Disposition  Patient remains hospitalized.    Lequita Asal, MD  04/13/2019, 10:23 PM

## 2019-04-13 NOTE — Progress Notes (Signed)
ID  Pancytopenia Neutropenia Oral mucositis/stomatitis Methotrexate  Toxicity questioned Low folate B/l lymphedema with b/l cellulits with blisters that have flattened and he has scabs Pressure ulcers ankles Sacral pressure maceration Foley catheter    Pt says he is feeling better today Says no fever since this morning Not groggy like before No abdominal pain  O/e awake and alert Oral ulcers persist Lips( lower) scab Chest b/l air entry [id number] Abd soft Sacral area no ulcer   Legs - edema much better Discoloration Scabs No overt infection     CNS -non focal  CBC Latest Ref Rng & Units 04/13/2019 04/12/2019 04/11/2019  WBC 4.0 - 10.5 K/uL 3.6(L) 1.9(L) 1.4(LL)  Hemoglobin 13.0 - 17.0 g/dL 8.6(L) 8.9(L) 9.1(L)  Hematocrit 39.0 - 52.0 % 26.4(L) 28.3(L) 28.8(L)  Platelets 150 - 400 K/uL 192 94(L) 47(L)    CMP Latest Ref Rng & Units 04/13/2019 04/12/2019 04/11/2019  Glucose 70 - 99 mg/dL 88 75 95  BUN 8 - 23 mg/dL '16 14 17  '$ Creatinine 0.61 - 1.24 mg/dL 1.10 1.09 0.99  Sodium 135 - 145 mmol/L 146(H) 148(H) 145  Potassium 3.5 - 5.1 mmol/L 3.7 3.8 3.4(L)  Chloride 98 - 111 mmol/L 116(H) 117(H) 112(H)  CO2 22 - 32 mmol/L '23 24 25  '$ Calcium 8.9 - 10.3 mg/dL 8.1(L) 8.3(L) 8.2(L)  Total Protein 6.5 - 8.1 g/dL - - -  Total Bilirubin 0.3 - 1.2 mg/dL - - -  Alkaline Phos 38 - 126 U/L - - -  AST 15 - 41 U/L - - -  ALT 0 - 44 U/L - - -   Microbiology- 11/20 BC NG 11/21 UC ng  Impression/recommendation Febrile neutropenia Improving neutrophils and also no fever since last night - on vanco and meropenem Will start de-escalating if afebrile for 24 hrs as cultures neg  Oral mucositis- likely source for fever D.D b/l lymphedema mimicking cellulitis legs which has resolved, but he does have wounds on his ankle from pressure which could have been the source as well Better  Neutropenia and thrombocytopenia improving  ANC 780  Thrombocytopenia resolved  Methotrexate  toxicity- looks like resolving B/l lymphedema causing blisters with deroofing and scabbing-could have had cellulitis or could be a mimic as it was b/l Much improved   Rheumatoid arthritis   Low folate - question of whether he was taking it  Regularly? With MXT  Discussed the management with the patient  Await bone marrow biopsy result

## 2019-04-13 NOTE — Progress Notes (Signed)
Physical Therapy Treatment Patient Details Name: Jeremiah [name]. MRN: [id number] DOB: [birth year] Today's Date: 04/13/2019    History of Present Illness  78 y.o. male with Past medical history of rheumatoid arthritis on immunosuppressive therapy, seizures, HTN, chronic lymphedema.  Went to [facility] last week with cellulitis and got antibiotics, however LE swelling had continued along with increased difficulty with mobility and strength.    PT Comments    Pt presented with deficits in strength, transfers, mobility, gait, balance, and activity tolerance.  Pt continued to be limited by pain with BLE movement but appeared grossly improved compared to previous sessions.  Pt actively participated with below therex but required frequent verbal and tactile cues to remain engaged.  Pt will benefit from PT services in a SNF setting upon discharge to safely address above deficits for decreased caregiver assistance and eventual return to PLOF.     Follow Up Recommendations  SNF     Equipment Recommendations  None recommended by PT    Recommendations for Other Services       Precautions / Restrictions Precautions Precautions: Fall Restrictions Weight Bearing Restrictions: No    Mobility  Bed Mobility               General bed mobility comments: Deferred due to pain with LE movement  Transfers                    Ambulation/Gait                 Stairs             Wheelchair Mobility    Modified Rankin (Stroke Patients Only)       Balance                                            Cognition Arousal/Alertness: Awake/alert Behavior During Therapy: WFL for tasks assessed/performed Overall Cognitive Status: Within Functional Limits for tasks assessed                                 General Comments: Pt mildy confused but followed commands with extra time and cuing      Exercises Total Joint Exercises Ankle  Circles/Pumps: Strengthening;Both;5 reps;10 reps Quad Sets: Strengthening;Both;5 reps;10 reps Gluteal Sets: Strengthening;Both;5 reps;10 reps Short Arc Quad: Strengthening;Both;5 reps;10 reps Heel Slides: AAROM;Both;5 reps;10 reps Hip ABduction/ADduction: AAROM;Both;5 reps;10 reps Straight Leg Raises: AAROM;Both;5 reps;10 reps Other Exercises Other Exercises: HEP education with pt and spouse for BLE APs, QS, and GS x 10 each 5-6x/day    General Comments        Pertinent Vitals/Pain Pain Assessment: Faces Faces Pain Scale: Hurts a little bit Pain Location: with any attempts at LE movement Pain Descriptors / Indicators: Grimacing;Moaning Pain Intervention(s): Limited activity within patient's tolerance    Home Living                      Prior Function            PT Goals (current goals can now be found in the care plan section) Progress towards PT goals: Not progressing toward goals - comment(Pt limited by BLE pain)    Frequency    Min 2X/week      PT Plan Current plan remains appropriate  Co-evaluation              AM-PAC PT "6 Clicks" Mobility   Outcome Measure  Help needed turning from your back to your side while in a flat bed without using bedrails?: Total Help needed moving from lying on your back to sitting on the side of a flat bed without using bedrails?: Total Help needed moving to and from a bed to a chair (including a wheelchair)?: Total Help needed standing up from a chair using your arms (e.g., wheelchair or bedside chair)?: Total Help needed to walk in hospital room?: Total Help needed climbing 3-5 steps with a railing? : Total 6 Click Score: 6    End of Session Equipment Utilized During Treatment: Gait belt Activity Tolerance: Patient limited by pain Patient left: in bed;with call bell/phone within reach;with bed alarm set;with family/visitor present Nurse Communication: Mobility status PT Visit Diagnosis: Muscle weakness  (generalized) (M62.81);Unsteadiness on feet (R26.81)     Time: [id number] PT Time Calculation (min) (ACUTE ONLY): 17 min  Charges:  $Therapeutic Exercise: 8-22 mins                     D. Scott Afua Hoots PT, DPT 04/13/19, 5:21 PM

## 2019-04-13 NOTE — Progress Notes (Signed)
PROGRESS NOTE    Jeremiah [name].  KF:[id number] DOB: [birth year] DOA: 03/31/2019 PCP: Derinda Late, MD    Brief Narrative:  Jeremiah [name]. is a 78 y.o. male with Past medical history of rheumatoid arthritis on immunosuppressive therapy, seizures, HTN, chronic lymphedema. Patient presented with complaints of worsening leg infection. Patient was seen on 03/26/2019 in the ED at [facility] for cellulitis of the leg.  Patient was discharged on oral Cipro and clindamycin. Patient was taking all his medication up to this many days and was compliant with it. There was an area of infection and redness which was marked. Since last 2 days patient's pain was worsening and that area of redness across the marked line and therefore patient decided to come to the hospital. No nausea no vomiting no fever no chills no chest pain abdominal pain no diarrhea no constipation. Patient remains compliant with all his medication. Continue to take all his immunosuppressive medication throughout this infection course.  ED Course: Presents with above complaint.  And referred for admission for failure for outpatient therapy.  At his baseline ambulates with assistance independent for most of his ADL;  manages his medication on his own.  Interim History: - 11/20 CT guided bone marrow aspiration and biopsy - 11/20: due to fever with neutropenia, Dr. Steva Ready of ID started patient with vancomycin and meropenem, pending Bx and Ux - 11/20: Tbo-Filgrastim (GRANIX) injection 300 mcg was given - 11/20: neuro was consulted for AMS. CT-head is a limited study, but did not showed acute intracranial abnormalities. - 11/23: spiked fever at 22:11 on 11/22 --> consulted ID again.  - pancytopenia, platelet improved:  CBC Latest Ref Rng & Units 04/13/2019 04/12/2019 04/11/2019  WBC 4.0 - 10.5 K/uL 3.6(L) 1.9(L) 1.4(LL)  Hemoglobin 13.0 - 17.0 g/dL 8.6(L) 8.9(L) 9.1(L)  Hematocrit 39.0 - 52.0 % 26.4(L) 28.3(L)  28.8(L)  Platelets 150 - 400 K/uL 192 94(L) 47(L)    Subjective: Has bilateral leg and foot edema, lymphedema and mild leg tenterness. No CP, AP, fever or chills.  No nausea, vomiting, diarrhea or abdominal pain. Pt is intermittently confused, this AM he knows his own name, but not oriented x time and place.   Assessment & Plan:   Principal Problem:   Neutropenia with fever (HCC) Active Problems:   Rheumatoid arthritis (HCC)   Seizure (HCC)   Pressure injury of skin   Other pancytopenia (HCC)   Folic acid deficiency   Cellulitis of lower extremity   AKI (acute kidney injury) ([place])   Pancytopenia (HCC)   Acute metabolic encephalopathy  Neutropenia with fever:  Dr. Delaine Lame of infectious disease was consulted.  Started the patient with vancomycin and meropenem 11/20. Spiked fever at 22:11 on 11/22 --> consulted ID again.  - highly appreciate Dr. Gwenevere Ghazi recommendation. -Vancomycin and meropenem -Follow-up blood culture and urine culture -Neutropenic precaution  Acute metabolic encephalopathy: Patient has been intermittently confused, etiology is not clear.  Differential diagnoses include delirium.  CT head was done to make sure pt does not have intracranial bleeding given thrombocytopenia, which is a limited study, but did not showed acute intracranial abnormalities.  Neurology, Dr. Doy Mince was consulted, highly appreciated Dr. Doy Mince consultation.   -Follow-up recommendations as follows:  1.  B1 level, CMET, serum ammonia -- ammonia level normal.  2. EEG  3. MRI of the brain with and without contrast -->negagive for acute issues   4. Consider decrease in Neurontin to 200mg  TID if EEG unremarkable.   -Frequent  neuro checks  Cellulitis of lower extremity superimposed on venous stasis changes associated lymphedema: blood culture no grow so far. Pt developed severe pancytopenia. ID was consulted 11/19. BNP 27. -Doxycycline stopped 11/19 -f/u Bx  New pancytopenia  with neutropenia: Unclear etiology. Likely due to mtx toxicity.  Heme-onc was consulted, oncology is on board, highly appreciated. 11/20 CT guided bone marrow aspiration and biopsy was done by IR. Improving.    Platelet and WBC continue to improve. -neutropenic [id number],  -on folic acid -iron study showed  -Tbo-Filgrastim (GRANIX) injection 300 mcg was given by oncology 11/20  CBC Latest Ref Rng & Units 04/13/2019 04/12/2019 04/11/2019  WBC 4.0 - 10.5 K/uL 3.6(L) 1.9(L) 1.4(LL)  Hemoglobin 13.0 - 17.0 g/dL 8.6(L) 8.9(L) 9.1(L)  Hematocrit 39.0 - 52.0 % 26.4(L) 28.3(L) 28.8(L)  Platelets 150 - 400 K/uL 192 94(L) 47(L)    Acute kidney injury: resolved.  -f/u by BMP  Rheumatoid arthritis:continueholding immunosuppressants currently, heme-onc as noted concerned about methotrexate toxicity  Neuropathy:  -Continue gabapentin -->decreased dose from 300 4 time per day to 200 mg tid on 11/22  Seizure disorder: no evidence of breakthrough seizures -ContinueLamictal -seizure precaution  Pressure ulcers present on admission. These were bilateral buttock as well as mid sacrum: Wound care was consultedappreciate their recommendations. -Continue with silver Hydrofiber, and foam changing every 3 days and as needed for soilage  DVT prophylaxis: SCD Code Status: full Family Communication: yes, called his daughter in-law Disposition Plan: not ready for discharge. Patient remains severely deconditioned/ debilitated. spiked fever 11/22. Still need IV antibiotics.    Consultants:  Infectious disease ID Oncology IR  Procedures: 11/20: CT guided bone marrow aspiration and biopsy  Antimicrobials: Anti-infectives (From admission, onward)   Start     Dose/Rate Route Frequency Ordered Stop   04/12/19 0000  vancomycin (VANCOCIN) 1,750 mg in sodium chloride 0.9 % 500 mL IVPB     1,750 mg 250 mL/hr over 120 Minutes Intravenous Every 24 hours 04/11/19 0508     04/11/19 0000   vancomycin (VANCOCIN) 2,000 mg in sodium chloride 0.9 % 500 mL IVPB     2,000 mg 250 mL/hr over 120 Minutes Intravenous  Once 04/10/19 2346 04/11/19 0228   04/11/19 0000  meropenem (MERREM) 1 g in sodium chloride 0.9 % 100 mL IVPB     1 g 200 mL/hr over 30 Minutes Intravenous Every 8 hours 04/10/19 2346     04/03/19 1230  doxycycline (VIBRA-TABS) tablet 100 mg  Status:  Discontinued     100 mg Oral Every 12 hours 04/03/19 1224 04/09/19 1547   04/02/19 1500  vancomycin (VANCOCIN) 1,250 mg in sodium chloride 0.9 % 250 mL IVPB  Status:  Discontinued     1,250 mg 166.7 mL/hr over 90 Minutes Intravenous Every 48 hours 03/31/19 1902 04/01/19 0844   04/02/19 0800  vancomycin (VANCOCIN) 1,250 mg in sodium chloride 0.9 % 250 mL IVPB  Status:  Discontinued     1,250 mg 166.7 mL/hr over 90 Minutes Intravenous Every 36 hours 04/01/19 0844 04/03/19 1224   04/02/19 0000  minocycline (MINOCIN) 100 MG capsule     100 mg Oral 2 times daily 04/02/19 1254 04/12/19 2359   03/31/19 1830  vancomycin (VANCOCIN) IVPB 1000 mg/200 mL premix     1,000 mg 200 mL/hr over 60 Minutes Intravenous  Once 03/31/19 1819 03/31/19 2209   03/31/19 1445  vancomycin (VANCOCIN) IVPB 1000 mg/200 mL premix     1,000 mg 200 mL/hr over 60 Minutes Intravenous  Once 03/31/19 1436 03/31/19 1643         Objective: Vitals:   04/13/19 0551 04/13/19 1023 04/13/19 1402 04/13/19 2038  BP: (!) 98/54 (!) 90/49 110/61 117/61  Pulse:  (!) 107 92 100  Resp:   18 18  Temp:   (!) 97.5 F (36.4 C) (!) 97.5 F (36.4 C)  TempSrc:   Oral Oral  SpO2:   98% 100%  Weight:      Height:        Intake/Output Summary (Last 24 hours) at 04/13/2019 2134 Last data filed at 04/13/2019 1800 Gross per 24 hour  Intake 1519.67 ml  Output 650 ml  Net 869.67 ml   Filed Weights   04/08/19 0500 04/09/19 0500 04/10/19 0500  Weight: 112.9 kg 112.9 kg 118.2 kg    Examination:  Physical Exam:  General: Not in acute distress HEENT: PERRL, EOMI,  no scleral icterus, No JVD or bruit Cardiac: S1/S2, RRR, No murmurs, gallops or rubs Pulm:  No rales, wheezing, rhonchi or rubs. Abd: Soft, nondistended, nontender, no rebound pain, no organomegaly, BS present Ext: has bilateral leg and foot edema and lymphedema. 1+DP/PT pulse bilaterally Musculoskeletal: No joint deformities, erythema, or stiffness, ROM full Skin: No rashes.  Neuro: Pt is confused, knows his own name, but not oriented x 3. cranial nerves II-XII grossly intact, moves all extremeties normally. Psych: Patient is not psychotic, no suicidal or hemocidal ideation.    Data Reviewed: I have personally reviewed following labs and imaging studies  CBC: Recent Labs  Lab 04/09/19 0616 04/10/19 0701 04/11/19 0842 04/12/19 0504 04/13/19 0412  WBC 1.5* 1.5* 1.4* 1.9* 3.6*  NEUTROABS 0.4* 0.2* 0.1* 0.1* 0.8*  HGB 9.3* 9.7* 9.1* 8.9* 8.6*  HCT 28.7* 31.0* 28.8* 28.3* 26.4*  MCV 87.2 90.6 89.4 89.6 85.7  PLT 33* 23* 47* 94* [id number]   Basic Metabolic Panel: Recent Labs  Lab 04/09/19 0616 04/10/19 0701 04/11/19 0842 04/12/19 0504 04/13/19 0412  NA 149* 145 145 148* 146*  K 4.2 3.5 3.4* 3.8 3.7  CL 112* 110 112* 117* 116*  CO2 27 25 25 24 23   GLUCOSE 93 85 95 75 88  BUN 19 18 17 14 16   CREATININE 0.98 1.01 0.99 1.09 1.10  CALCIUM 9.0 8.6* 8.2* 8.3* 8.1*  MG  --   --   --   --  2.0   GFR: Estimated Creatinine Clearance: 74.6 mL/min (by C-G formula based on SCr of 1.1 mg/dL). Liver Function Tests: No results for input(s): AST, ALT, ALKPHOS, BILITOT, PROT, ALBUMIN in the last 168 hours. No results for input(s): LIPASE, AMYLASE in the last 168 hours. Recent Labs  Lab 04/12/19 1609  AMMONIA 10   Coagulation Profile: No results for input(s): INR, PROTIME in the last 168 hours. Cardiac Enzymes: No results for input(s): CKTOTAL, CKMB, CKMBINDEX, TROPONINI in the last 168 hours. BNP (last 3 results) No results for input(s): PROBNP in the last 8760 hours. HbA1C: No  results for input(s): HGBA1C in the last 72 hours. CBG: No results for input(s): GLUCAP in the last 168 hours. Lipid Profile: No results for input(s): CHOL, HDL, LDLCALC, TRIG, CHOLHDL, LDLDIRECT in the last 72 hours. Thyroid Function Tests: No results for input(s): TSH, T4TOTAL, FREET4, T3FREE, THYROIDAB in the last 72 hours. Anemia Panel: Recent Labs    04/12/19 0504  FERRITIN 368*  TIBC 119*  IRON 16*   Sepsis Labs: Recent Labs  Lab 04/13/19 0905 04/13/19 1054  PROCALCITON 0.38  --  LATICACIDVEN 1.6 1.6    Recent Results (from the past 240 hour(s))  Culture, blood (x 2)     Status: None   Collection Time: 04/05/19 11:17 AM   Specimen: BLOOD  Result Value Ref Range Status   Specimen Description BLOOD LEFT ANTECUBITAL  Final   Special Requests   Final    BOTTLES DRAWN AEROBIC AND ANAEROBIC Blood Culture adequate volume   Culture   Final    NO GROWTH 5 DAYS Performed at [facility], [address] [place], [place] [address]    Report Status 04/10/2019 FINAL  Final  Culture, blood (x 2)     Status: None   Collection Time: 04/05/19 11:25 AM   Specimen: BLOOD  Result Value Ref Range Status   Specimen Description BLOOD RIGHT ANTECUBITAL  Final   Special Requests   Final    BOTTLES DRAWN AEROBIC AND ANAEROBIC Blood Culture adequate volume   Culture   Final    NO GROWTH 5 DAYS Performed at [facility], [address], [place], [place] [address]    Report Status 04/10/2019 FINAL  Final  SARS CORONAVIRUS 2 (TAT 6-24 HRS) Nasopharyngeal Nasopharyngeal Swab     Status: None   Collection Time: 04/08/19  2:04 PM   Specimen: Nasopharyngeal Swab  Result Value Ref Range Status   SARS Coronavirus 2 NEGATIVE NEGATIVE Final    Comment: (NOTE) SARS-CoV-2 target nucleic acids are NOT DETECTED. The SARS-CoV-2 RNA is generally detectable in upper and lower respiratory specimens during the acute phase of infection. Negative results do not preclude SARS-CoV-2  infection, do not rule out co-infections with other pathogens, and should not be used as the sole basis for treatment or other patient management decisions. Negative results must be combined with clinical observations, patient history, and epidemiological information. The expected result is Negative. Fact Sheet for Patients: SugarRoll.be Fact Sheet for Healthcare Providers: https://www.woods-mathews.com/ This test is not yet approved or cleared by the [place] FDA and  has been authorized for detection and/or diagnosis of SARS-CoV-2 by FDA under an Emergency Use Authorization (EUA). This EUA will remain  in effect (meaning this test can be used) for the duration of the COVID-19 declaration under Section 56 4(b)(1) of the Act, 21 U.S.C. section 360bbb-3(b)(1), unless the authorization is terminated or revoked sooner. Performed at [facility], [place] [address] [place], [place] [address]   CULTURE, BLOOD (ROUTINE X 2) w Reflex to ID Panel     Status: None (Preliminary result)   Collection Time: 04/10/19 11:55 PM   Specimen: BLOOD  Result Value Ref Range Status   Specimen Description BLOOD RIGHT ANTECUBITAL  Final   Special Requests   Final    BOTTLES DRAWN AEROBIC AND ANAEROBIC Blood Culture adequate volume   Culture   Final    NO GROWTH 2 DAYS Performed at [facility], [address] [place], [place] [address]    Report Status PENDING  Incomplete  CULTURE, BLOOD (ROUTINE X 2) w Reflex to ID Panel     Status: None (Preliminary result)   Collection Time: 04/10/19 11:59 PM   Specimen: BLOOD  Result Value Ref Range Status   Specimen Description BLOOD RIGHT ARM  Final   Special Requests   Final    BOTTLES DRAWN AEROBIC AND ANAEROBIC Blood Culture adequate volume   Culture   Final    NO GROWTH 2 DAYS Performed at [facility], [address], [place],  [address]  Report Status PENDING  Incomplete   Urine Culture     Status: None   Collection Time: 04/11/19  2:00 AM   Specimen: Urine, Random  Result Value Ref Range Status   Specimen Description   Final    URINE, RANDOM Performed at [facility], [address] [place], [place] [address]    Special Requests   Final    NONE Performed at [facility], [address] [place], [place] [address]    Culture   Final    NO GROWTH Performed at [facility], [place] [address] [place], [place] [address]    Report Status 04/12/2019 FINAL  Final     RN Pressure Injury Documentation: Pressure Injury 04/03/19 Sacrum (Active)  04/03/19 1653  Location: Sacrum  Location Orientation:   Staging:   Wound Description (Comments):   Present on Admission: Yes     Pressure Injury Heel Left;Posterior;Right Unstageable - Full thickness tissue loss in which the base of the ulcer is covered by slough (yellow, tan, gray, green or brown) and/or eschar (tan, brown or black) in the wound bed. (Active)     Location: Heel  Location Orientation: Left;Posterior;Right  Staging: Unstageable - Full thickness tissue loss in which the base of the ulcer is covered by slough (yellow, tan, gray, green or brown) and/or eschar (tan, brown or black) in the wound bed.  Wound Description (Comments):   Present on Admission:      Pressure Injury 04/07/19 Thigh Right;Medial Stage II -  Partial thickness loss of dermis presenting as a shallow open ulcer with a red, pink wound bed without slough. (Active)  04/07/19 1309  Location: Thigh  Location Orientation: Right;Medial  Staging: Stage II -  Partial thickness loss of dermis presenting as a shallow open ulcer with a red, pink wound bed without slough.  Wound Description (Comments):   Present on Admission: No       Radiology Studies: Mr Jeri Cos Wo Contrast  Result Date: 04/12/2019 CLINICAL DATA:  Encephalopathy. History of hypertension and rheumatoid arthritis. EXAM: MRI HEAD WITHOUT AND  WITH CONTRAST TECHNIQUE: Multiplanar, multiecho pulse sequences of the brain and surrounding structures were obtained without and with intravenous contrast. CONTRAST:  7mL GADAVIST GADOBUTROL 1 MMOL/ML IV SOLN COMPARISON:  None. FINDINGS: BRAIN: There is no acute infarct, acute hemorrhage or extra-axial collection. Early confluent hyperintense T2-weighted signal of the periventricular and deep white matter, most commonly due to chronic ischemic microangiopathy. There is generalized atrophy without lobar predilection. The midline structures are normal. There is no abnormal contrast enhancement. VASCULAR: The major intracranial arterial and venous sinus flow voids are normal. Susceptibility-sensitive sequences show no chronic microhemorrhage or superficial siderosis. SKULL AND UPPER CERVICAL SPINE: Calvarial bone marrow signal is normal. There is no skull base mass. The visualized upper cervical spine and soft tissues are normal. SINUSES/ORBITS: Left-greater-than-right mastoid fluid. No paranasal sinus fluid levels or advanced mucosal thickening. The orbits are normal. IMPRESSION: 1. No acute intracranial abnormality. 2. Generalized atrophy and chronic ischemic microangiopathy. Electronically Signed   By: Ulyses Jarred M.D.   On: 04/12/2019 19:56        Scheduled Meds: . aspirin  81 mg Oral Daily  . Chlorhexidine Gluconate Cloth  6 each Topical Daily  . feeding supplement (ENSURE ENLIVE)  237 mL Oral TID BM  . folic acid  1 mg Oral Daily  . gabapentin  200 mg Oral TID  . lamoTRIgine  200 mg Oral BID  . lamoTRIgine  25 mg Oral BID  . loratadine  10 mg Oral Daily  . mouth rinse  15 mL Mouth Rinse BID  . metoprolol tartrate  12.5 mg Oral BID  . [START ON 04/14/2019] multivitamin with minerals  1 tablet Oral Daily  . pantoprazole  40 mg Oral Daily  . polyethylene glycol  17 g Oral Once  . sodium chloride flush  3 mL Intravenous Q12H  . Tbo-Filgrastim  300 mcg Subcutaneous Daily  . traZODone  50 mg  Oral QHS  . vitamin C  500 mg Oral BID   Continuous Infusions: . sodium chloride Stopped (04/12/19 1011)  . sodium chloride 100 mL/hr at 04/13/19 1800  . meropenem (MERREM) IV Stopped (04/13/19 1617)  . vancomycin Stopped (04/13/19 0137)     LOS: 12 days    Time spent: 30 min     Ivor Costa, DO [facility] PAGER is on AMION  If 7PM-7AM, please contact night-coverage [email or web address] Password [facility] 04/13/2019, 9:34 PM

## 2019-04-13 NOTE — Progress Notes (Signed)
Subjective: slightly improved as he is following commands. Could not state location.    Past Medical History:  Diagnosis Date  . Allergy   . Arthritis   . Hypertension   . Rheumatoid arthritis ([place])   . Seizures ([place])   . Sepsis ([place]) 05/23/2015    Past Surgical History:  Procedure Laterality Date  . back surgery five times  04/2016  . CARPAL TUNNEL RELEASE    . HERNIA REPAIR    . JOINT REPLACEMENT    . knee replacement right    . right hip replacement    . rotator cuff surgery    . SPINE SURGERY      Family History  Problem Relation Age of Onset  . Hypertension Other   . Hypertension Mother   . Dementia Mother   . Dementia Father   . Prostate cancer Neg Hx   . Bladder Cancer Neg Hx   . Kidney cancer Neg Hx     Social History:  reports that he has quit smoking. His smoking use included cigarettes. He has a 20.00 pack-year smoking history. He has never used smokeless tobacco. He reports that he does not drink alcohol. No history on file for drug.  Allergies  Allergen Reactions  . Cefoperazone Anaphylaxis  . Penicillins Anaphylaxis and Other (See Comments)    Has patient had a PCN reaction causing immediate rash, facial/tongue/throat swelling, SOB or lightheadedness with hypotension: Yes Has patient had a PCN reaction causing severe rash involving mucus membranes or skin necrosis: No Has patient had a PCN reaction that required hospitalization No Has patient had a PCN reaction occurring within the last 10 years: No If all of the above answers are "NO", then may proceed with Cephalosporin use.  . Latex Rash    Medications:  I have reviewed the patient's current medications. Prior to Admission:  Medications Prior to Admission  Medication Sig Dispense Refill Last Dose  . aspirin 81 MG chewable tablet Chew 81 mg by mouth daily.   03/31/2019 at 0800  . Cinnamon 500 MG capsule Take 500 mg by mouth 2 (two) times daily.   03/31/2019 at 0800  . ciprofloxacin (CIPRO) 500 MG  tablet Take 500 mg by mouth 2 (two) times daily.   03/31/2019 at 0800  . [EXPIRED] clindamycin (CLEOCIN) 300 MG capsule Take 300 mg by mouth 4 (four) times daily. For 14 days   03/31/2019 at 0800  . etanercept (ENBREL SURECLICK) 50 MG/ML injection Inject 50 mg into the skin every Friday.    Past Week at Unknown time  . folic acid (FOLVITE) 1 MG tablet Take 1 mg by mouth daily.   03/31/2019 at 0800  . gabapentin (NEURONTIN) 300 MG capsule Take 1 capsule (300 mg total) by mouth 4 (four) times daily. QHS 120 capsule 0 03/30/2019 at 2000  . hydroxychloroquine (PLAQUENIL) 200 MG tablet Take 400 mg by mouth at bedtime.   03/30/2019 at 2000  . lamoTRIgine (LAMICTAL) 200 MG tablet Take 200 mg by mouth 2 (two) times daily.   03/31/2019 at 0800  . lamoTRIgine (LAMICTAL) 25 MG tablet Take 25 mg by mouth 2 (two) times daily.   03/31/2019 at 0800  . magnesium oxide (MAG-OX) 400 MG tablet Take 400 mg by mouth daily.   03/31/2019 at 0800  . methotrexate (RHEUMATREX) 2.5 MG tablet TAKE 10 TABS BY MOUTH ONCE A WEEK   03/30/2019 at Unknown time  . nystatin-triamcinolone ointment (MYCOLOG) Apply 1 application topically 2 (two) times daily. 30 g  2 03/31/2019 at 0800  . omeprazole (PRILOSEC) 40 MG capsule Take 40 mg by mouth daily.    03/31/2019 at 0800  . traZODone (DESYREL) 50 MG tablet Take 50 mg by mouth at bedtime.   03/30/2019 at 2000  . acetaminophen (TYLENOL) 325 MG tablet Take 2 tablets (650 mg total) by mouth every 6 (six) hours as needed for mild pain (or Fever >/= 101).   prn at prn  . polyethylene glycol (MIRALAX / GLYCOLAX) packet Take 17 g by mouth daily. 14 each 0 prn at prn   Scheduled: . aspirin  81 mg Oral Daily  . Chlorhexidine Gluconate Cloth  6 each Topical Daily  . folic acid  1 mg Oral Daily  . gabapentin  200 mg Oral TID  . lamoTRIgine  200 mg Oral BID  . lamoTRIgine  25 mg Oral BID  . loratadine  10 mg Oral Daily  . mouth rinse  15 mL Mouth Rinse BID  . metoprolol tartrate  12.5 mg Oral BID   . pantoprazole  40 mg Oral Daily  . polyethylene glycol  17 g Oral Once  . sodium chloride flush  3 mL Intravenous Q12H  . Tbo-Filgrastim  300 mcg Subcutaneous Daily  . traZODone  50 mg Oral QHS    Physical Examination: Blood pressure (!) 90/49, pulse (!) 107, temperature 97.9 F (36.6 C), temperature source Oral, resp. rate 16, height 6' (1.829 m), weight 118.2 kg, SpO2 96 %.  HEENT-  Normocephalic, no lesions, without obvious abnormality.  Normal external eye and conjunctiva.  Normal TM's bilaterally.  Normal auditory canals and external ears. Normal external nose, mucus membranes and septum.  Normal pharynx. Cardiovascular- S1, S2 normal, pulses palpable throughout   Lungs- chest clear, no wheezing, rales, normal symmetric air entry Abdomen- soft, non-tender; bowel sounds normal; no masses,  no organomegaly Extremities- LE swelling Lymph-no adenopathy palpable Musculoskeletal-no joint tenderness, deformity or swelling Skin-bruising on UE's, LE in bandaging  Neurological Examination   Mental Status: Alert.  Reports he is in [place].  Reports it is 20-2 for the year.  Knows the month and that Thanksgiving is coming up.  Knows age and birth date.  Emotionally labile.  Speech fluent without evidence of aphasia.  Able to follow 3 step commands with some mild reinforcement. Cranial Nerves: II: Visual fields grossly normal, pupils equal, round, reactive to light and accommodation III,IV, VI: ptosis not present, extra-ocular motions intact bilaterally V,VII: smile symmetric, facial light touch sensation normal bilaterally VIII: hearing normal bilaterally IX,X: gag reflex present XI: bilateral shoulder shrug XII: midline tongue extension Motor: 5/5 strength in the BUE's.  Unable to lift either lower extremity off the bed Sensory: Pinprick and light touch intact throughout, bilaterally Deep Tendon Reflexes: Symmetric throughout Plantars: Right: unable to test   Left: unable to  test Cerebellar: Normal finger-to-nose testing bilaterally Gait: not tested due to safety concerns    Laboratory Studies:   Basic Metabolic Panel: Recent Labs  Lab 04/09/19 0616 04/10/19 0701 04/11/19 0842 04/12/19 0504 04/13/19 0412  NA 149* 145 145 148* 146*  K 4.2 3.5 3.4* 3.8 3.7  CL 112* 110 112* 117* 116*  CO2 27 25 25 24 23   GLUCOSE 93 85 95 75 88  BUN 19 18 17 14 16   CREATININE 0.98 1.01 0.99 1.09 1.10  CALCIUM 9.0 8.6* 8.2* 8.3* 8.1*  MG  --   --   --   --  2.0    Liver Function Tests: No  results for input(s): AST, ALT, ALKPHOS, BILITOT, PROT, ALBUMIN in the last 168 hours. No results for input(s): LIPASE, AMYLASE in the last 168 hours. Recent Labs  Lab 04/12/19 1609  AMMONIA 10    CBC: Recent Labs  Lab 04/09/19 0616 04/10/19 0701 04/11/19 0842 04/12/19 0504 04/13/19 0412  WBC 1.5* 1.5* 1.4* 1.9* 3.6*  NEUTROABS 0.4* 0.2* 0.1* 0.1* 0.8*  HGB 9.3* 9.7* 9.1* 8.9* 8.6*  HCT 28.7* 31.0* 28.8* 28.3* 26.4*  MCV 87.2 90.6 89.4 89.6 85.7  PLT 33* 23* 47* 94* 192    Cardiac Enzymes: No results for input(s): CKTOTAL, CKMB, CKMBINDEX, TROPONINI in the last 168 hours.  BNP: Invalid input(s): POCBNP  CBG: No results for input(s): GLUCAP in the last 168 hours.  Microbiology: Results for orders placed or performed during the hospital encounter of 03/31/19  Culture, blood (routine x 2)     Status: None   Collection Time: 03/31/19  2:40 PM   Specimen: BLOOD  Result Value Ref Range Status   Specimen Description BLOOD LEFT ANTECUBITAL  Final   Special Requests   Final    BOTTLES DRAWN AEROBIC AND ANAEROBIC Blood Culture adequate volume   Culture   Final    NO GROWTH 11 DAYS Performed at [facility], [address], [place], [place] [address]    Report Status 04/11/2019 FINAL  Final  Culture, blood (routine x 2)     Status: None   Collection Time: 03/31/19  2:40 PM   Specimen: BLOOD  Result Value Ref Range Status   Specimen Description  BLOOD BLOOD LEFT HAND  Final   Special Requests   Final    BOTTLES DRAWN AEROBIC AND ANAEROBIC Blood Culture adequate volume   Culture   Final    NO GROWTH 11 DAYS Performed at [facility], [address] [place], [place] [address]    Report Status 04/11/2019 FINAL  Final  SARS CORONAVIRUS 2 (TAT 6-24 HRS) Nasopharyngeal Nasopharyngeal Swab     Status: None   Collection Time: 03/31/19  2:51 PM   Specimen: Nasopharyngeal Swab  Result Value Ref Range Status   SARS Coronavirus 2 NEGATIVE NEGATIVE Final    Comment: (NOTE) SARS-CoV-2 target nucleic acids are NOT DETECTED. The SARS-CoV-2 RNA is generally detectable in upper and lower respiratory specimens during the acute phase of infection. Negative results do not preclude SARS-CoV-2 infection, do not rule out co-infections with other pathogens, and should not be used as the sole basis for treatment or other patient management decisions. Negative results must be combined with clinical observations, patient history, and epidemiological information. The expected result is Negative. Fact Sheet for Patients: SugarRoll.be Fact Sheet for Healthcare Providers: https://www.woods-mathews.com/ This test is not yet approved or cleared by the [place] FDA and  has been authorized for detection and/or diagnosis of SARS-CoV-2 by FDA under an Emergency Use Authorization (EUA). This EUA will remain  in effect (meaning this test can be used) for the duration of the COVID-19 declaration under Section 56 4(b)(1) of the Act, 21 U.S.C. section 360bbb-3(b)(1), unless the authorization is terminated or revoked sooner. Performed at [facility], [place] [address], [place], [place] [address]   Culture, blood (x 2)     Status: None   Collection Time: 04/05/19 11:17 AM   Specimen: BLOOD  Result Value Ref Range Status   Specimen Description BLOOD LEFT ANTECUBITAL  Final   Special Requests   Final     BOTTLES DRAWN AEROBIC AND ANAEROBIC  Blood Culture adequate volume   Culture   Final    NO GROWTH 5 DAYS Performed at [facility], [address] [place], [place] [address]    Report Status 04/10/2019 FINAL  Final  Culture, blood (x 2)     Status: None   Collection Time: 04/05/19 11:25 AM   Specimen: BLOOD  Result Value Ref Range Status   Specimen Description BLOOD RIGHT ANTECUBITAL  Final   Special Requests   Final    BOTTLES DRAWN AEROBIC AND ANAEROBIC Blood Culture adequate volume   Culture   Final    NO GROWTH 5 DAYS Performed at [facility], [address], [place], [place] [address]    Report Status 04/10/2019 FINAL  Final  SARS CORONAVIRUS 2 (TAT 6-24 HRS) Nasopharyngeal Nasopharyngeal Swab     Status: None   Collection Time: 04/08/19  2:04 PM   Specimen: Nasopharyngeal Swab  Result Value Ref Range Status   SARS Coronavirus 2 NEGATIVE NEGATIVE Final    Comment: (NOTE) SARS-CoV-2 target nucleic acids are NOT DETECTED. The SARS-CoV-2 RNA is generally detectable in upper and lower respiratory specimens during the acute phase of infection. Negative results do not preclude SARS-CoV-2 infection, do not rule out co-infections with other pathogens, and should not be used as the sole basis for treatment or other patient management decisions. Negative results must be combined with clinical observations, patient history, and epidemiological information. The expected result is Negative. Fact Sheet for Patients: SugarRoll.be Fact Sheet for Healthcare Providers: https://www.woods-mathews.com/ This test is not yet approved or cleared by the [place] FDA and  has been authorized for detection and/or diagnosis of SARS-CoV-2 by FDA under an Emergency Use Authorization (EUA). This EUA will remain  in effect (meaning this test can be used) for the duration of the COVID-19 declaration under Section 56 4(b)(1) of the  Act, 21 U.S.C. section 360bbb-3(b)(1), unless the authorization is terminated or revoked sooner. Performed at [facility], [place] [address] [place], [place] [address]   CULTURE, BLOOD (ROUTINE X 2) w Reflex to ID Panel     Status: None (Preliminary result)   Collection Time: 04/10/19 11:55 PM   Specimen: BLOOD  Result Value Ref Range Status   Specimen Description BLOOD RIGHT ANTECUBITAL  Final   Special Requests   Final    BOTTLES DRAWN AEROBIC AND ANAEROBIC Blood Culture adequate volume   Culture   Final    NO GROWTH 2 DAYS Performed at [facility], [address], [place], [place] [address]    Report Status PENDING  Incomplete  CULTURE, BLOOD (ROUTINE X 2) w Reflex to ID Panel     Status: None (Preliminary result)   Collection Time: 04/10/19 11:59 PM   Specimen: BLOOD  Result Value Ref Range Status   Specimen Description BLOOD RIGHT ARM  Final   Special Requests   Final    BOTTLES DRAWN AEROBIC AND ANAEROBIC Blood Culture adequate volume   Culture   Final    NO GROWTH 2 DAYS Performed at [facility], [address], [place], [place] [address]    Report Status PENDING  Incomplete  Urine Culture     Status: None   Collection Time: 04/11/19  2:00 AM   Specimen: Urine, Random  Result Value Ref Range Status   Specimen Description   Final    URINE, RANDOM Performed at [facility], [address], [place],  [address]    Special Requests  Final    NONE Performed at [facility], [address] [place], [place] [address]    Culture   Final    NO GROWTH Performed at [facility], [place] [address], [place], [place] [address]    Report Status 04/12/2019 FINAL  Final    Coagulation Studies: No results for input(s): LABPROT, INR in the last 72 hours.  Urinalysis:  Recent Labs  Lab 04/06/19 1834  [place] 1.025  PHURINE TEST NOT REPORTED DUE TO COLOR INTERFERENCE OF URINE PIGMENT  GLUCOSEU  TEST NOT REPORTED DUE TO COLOR INTERFERENCE OF URINE PIGMENT*  HGBUR TEST NOT REPORTED DUE TO COLOR INTERFERENCE OF URINE PIGMENT*  BILIRUBINUR TEST NOT REPORTED DUE TO COLOR INTERFERENCE OF URINE PIGMENT*  KETONESUR TEST NOT REPORTED DUE TO COLOR INTERFERENCE OF URINE PIGMENT*  PROTEINUR TEST NOT REPORTED DUE TO COLOR INTERFERENCE OF URINE PIGMENT*  NITRITE TEST NOT REPORTED DUE TO COLOR INTERFERENCE OF URINE PIGMENT*  LEUKOCYTESUR TEST NOT REPORTED DUE TO COLOR INTERFERENCE OF URINE PIGMENT*    Lipid Panel:  No results found for: CHOL, TRIG, HDL, CHOLHDL, VLDL, LDLCALC  HgbA1C: No results found for: HGBA1C  Urine Drug Screen:  No results found for: LABOPIA, COCAINSCRNUR, LABBENZ, AMPHETMU, THCU, LABBARB  Alcohol Level: No results for input(s): ETH in the last 168 hours.   Imaging: Ct Head Wo Contrast  Result Date: 04/11/2019 CLINICAL DATA:  Encephalopathy EXAM: CT HEAD WITHOUT CONTRAST TECHNIQUE: Contiguous axial images were obtained from the base of the skull through the vertex without intravenous contrast. COMPARISON:  05/23/2015 FINDINGS: Technical note: Examination is significantly degraded by patient motion artifact. Patient was unable to follow instructions while in CT. Examination was repeated a total of 3 times with persistent artifact on all series which degrades image quality. Brain: No obvious large territory acute infarction or area of intracranial hemorrhage identified on these limited images. Mild prominence of the ventricles suggests mild cerebral atrophy, this is stable from prior. Vascular: Limited. Skull: No obvious skull fracture or bone lesion. Sinuses/Orbits: Grossly intact. Other: None. IMPRESSION: 1. Markedly limited exam secondary to patient motion artifact related to patient condition. 2. No obvious acute findings identified within the limitations of this exam. A repeat study when patient is able to cooperate is recommended. Electronically Signed   By: Davina Poke M.D.   On: 04/11/2019 17:50   Mr Jeri Cos [id number] Contrast  Result Date: 04/12/2019 CLINICAL DATA:  Encephalopathy. History of hypertension and rheumatoid arthritis. EXAM: MRI HEAD WITHOUT AND WITH CONTRAST TECHNIQUE: Multiplanar, multiecho pulse sequences of the brain and surrounding structures were obtained without and with intravenous contrast. CONTRAST:  28mL GADAVIST GADOBUTROL 1 MMOL/ML IV SOLN COMPARISON:  None. FINDINGS: BRAIN: There is no acute infarct, acute hemorrhage or extra-axial collection. Early confluent hyperintense T2-weighted signal of the periventricular and deep white matter, most commonly due to chronic ischemic microangiopathy. There is generalized atrophy without lobar predilection. The midline structures are normal. There is no abnormal contrast enhancement. VASCULAR: The major intracranial arterial and venous sinus flow voids are normal. Susceptibility-sensitive sequences show no chronic microhemorrhage or superficial siderosis. SKULL AND UPPER CERVICAL SPINE: Calvarial bone marrow signal is normal. There is no skull base mass. The visualized upper cervical spine and soft tissues are normal. SINUSES/ORBITS: Left-greater-than-right mastoid fluid. No paranasal sinus fluid levels or advanced mucosal thickening. The orbits are normal. IMPRESSION: 1. No acute intracranial abnormality. 2. Generalized atrophy and chronic ischemic microangiopathy. Electronically Signed   By: Ulyses Jarred  M.D.   On: 04/12/2019 19:56     Assessment/Plan: 78 y.o. male with a history of HTN, seizures and RA on immunosuppressant therapy whose family reports complaints of worsening mental status, present for at least the past year and worsening with recent illness and hospitalization.   Patient with a family history of dementia.  Head CT performed but poor quality due to motion.  No obvious abnormalities noted.  Patient with recent infection, on Merrem, hospitalized and with some metabolic abnormalities all  of which may be contributing to worsening mental status.    - Cognitive decline in setting of dementia and progressive cognitive declines complicated by metabolic encephalopathy  - MRI brain no acute abnormalities noted - Follows commands - given the hx of seizures agree with  EEG to be done today  - Will follow EEG results

## 2019-04-13 NOTE — Care Management Important Message (Signed)
Important Message  Patient Details  Name: Jeremiah [name]. MRN: [id number] Date of Birth: 1940-07-28   Medicare Important Message Given:  Yes     [name] 04/13/2019, 10:45 AM

## 2019-04-13 NOTE — Progress Notes (Signed)
Initial Nutrition Assessment  DOCUMENTATION CODES:   Not applicable  INTERVENTION:   Ensure Enlive po TID, each supplement provides 350 kcal and 20 grams of protein  MVI daily   Vitamin C '500mg'$  po BID   Dysphagia 3 diet- assist with meals  NUTRITION DIAGNOSIS:   Increased nutrient needs related to wound healing as evidenced by increased estimated needs.  GOAL:   Patient will meet greater than or equal to 90% of their needs  MONITOR:   PO intake, Supplement acceptance, Labs, Weight trends, Skin, I & O's  REASON FOR ASSESSMENT:   Consult Assessment of nutrition requirement/status  ASSESSMENT:   78 y/o male with a history of HTN, seizures and RA on immunosuppressant therapy who presented on 11/10 with complaints of worsening cellulitis.   Met with pt in room today; pt with some confusion but able to answer most questions appropriately. Pt reports poor appetite and oral intake for several days. Pt reports "I just don't have a taste for food". Pt's lunch tray sitting on side table untouched. Pt did drink some milk and coke today. RD will add supplements and vitamins to help pt meet his estimated needs and support wound healing. RD will also change pt to a dysphagia 3 diet r/t poor dentition. Per chart, pt appears weight stable pta.   Medications reviewed and include: aspirin, folic acid, protonix, meropenem, vancomycin   Labs reviewed: Na 146(H), Mg 2.0 wnl Wbc- 2.6(L), Hgb 8.6(L), Hct 26.4(L)   NUTRITION - FOCUSED PHYSICAL EXAM:    Most Recent Value  Orbital Region  Mild depletion  Upper Arm Region  No depletion  Thoracic and Lumbar Region  No depletion  Buccal Region  Mild depletion  Temple Region  Severe depletion  Clavicle Bone Region  Severe depletion  Clavicle and Acromion Bone Region  Severe depletion  Scapular Bone Region  Moderate depletion  Dorsal Hand  Severe depletion  Patellar Region  Unable to assess  Anterior Thigh Region  Unable to assess   Posterior Calf Region  Unable to assess  Edema (RD Assessment)  Severe  Hair  Reviewed  Eyes  Reviewed  Mouth  Reviewed  Skin  Reviewed [ecchymosis]  Nails  Reviewed       Diet Order:   Diet Order            DIET DYS 3 Room service appropriate? Yes with Assist; Fluid consistency: Thin  Diet effective now        Diet - low sodium heart healthy             EDUCATION NEEDS:   Education needs have been addressed  Skin:  Skin Assessment: Reviewed RN Assessment(ecchymosis, Scrotum: 0.3cm x 0.3cm x 0.1cm, Right inner thigh: 0.5cm x 0.3cm x 0.1cm)  Last BM:  11/23- type 5  Height:   Ht Readings from Last 1 Encounters:  04/12/19 6' (1.829 m)    Weight:   Wt Readings from Last 1 Encounters:  04/10/19 118.2 kg    Ideal Body Weight:  80.9 kg  BMI:  Body mass index is 35.34 kg/m.  Estimated Nutritional Needs:   Kcal:  2300-2600kcal/day  Protein:  115-130g/day  Fluid:  >2L/day  Koleen Distance MS, RD, LDN Pager #- [phone] Office#- [phone] After Hours Pager: [phone]

## 2019-04-13 NOTE — Progress Notes (Signed)
PALLIATIVE NOTE:  Referral received for goals of care. I assessed patient at the bedside. He was awake, alert to self and dob only. Could not appropriately respond to year, location, or president. He was able to provide his wife and son's name. Denied pain. Lunch tray noted at the bedside untouched. Offered to assist with set-up, however he refused stating he was not hungry and not interested in eating.   No family at the bedside. I attempted to contact patient's daughter-in-law, Kendry Ferrand who is recommended as initial contact [phone]). Voicemail left and will await for return call.   Detailed note and recommendations to follow once I have made contact with family and GOC is completed.   Thank you for your referral and allowing Palliative to assist in Jeremiah [name] care.   Alda Lea, AGPCNP-BC Palliative Medicine Team  Phone: [phone] Pager: [phone] Amion: N. [place]   NO CHARGE

## 2019-04-14 DIAGNOSIS — K1230 Oral mucositis (ulcerative), unspecified: Secondary | ICD-10-CM | POA: Diagnosis present

## 2019-04-14 DIAGNOSIS — T451X5D Adverse effect of antineoplastic and immunosuppressive drugs, subsequent encounter: Secondary | ICD-10-CM

## 2019-04-14 LAB — CBC WITH DIFFERENTIAL/PLATELET
Abs Immature Granulocytes: 4.09 10*3/uL — ABNORMAL HIGH (ref 0.00–0.07)
Basophils Absolute: 0.1 10*3/uL (ref 0.0–0.1)
Basophils Relative: 1 %
Eosinophils Absolute: 0.9 10*3/uL — ABNORMAL HIGH (ref 0.0–0.5)
Eosinophils Relative: 5 %
HCT: 26.4 % — ABNORMAL LOW (ref 39.0–52.0)
Hemoglobin: 8.5 g/dL — ABNORMAL LOW (ref 13.0–17.0)
Immature Granulocytes: 24 %
Lymphocytes Relative: 18 %
Lymphs Abs: 3 10*3/uL (ref 0.7–4.0)
MCH: 28.4 pg (ref 26.0–34.0)
MCHC: 32.2 g/dL (ref 30.0–36.0)
MCV: 88.3 fL (ref 80.0–100.0)
Monocytes Absolute: 2.2 10*3/uL — ABNORMAL HIGH (ref 0.1–1.0)
Monocytes Relative: 13 %
Neutro Abs: 6.7 10*3/uL (ref 1.7–7.7)
Neutrophils Relative %: 39 %
Platelets: 368 10*3/uL (ref 150–400)
RBC: 2.99 MIL/uL — ABNORMAL LOW (ref 4.22–5.81)
RDW: 18.7 % — ABNORMAL HIGH (ref 11.5–15.5)
WBC: 16.9 10*3/uL — ABNORMAL HIGH (ref 4.0–10.5)
nRBC: 1.5 % — ABNORMAL HIGH (ref 0.0–0.2)

## 2019-04-14 LAB — BASIC METABOLIC PANEL
Anion gap: 9 (ref 5–15)
BUN: 13 mg/dL (ref 8–23)
CO2: 23 mmol/L (ref 22–32)
Calcium: 7.8 mg/dL — ABNORMAL LOW (ref 8.9–10.3)
Chloride: 115 mmol/L — ABNORMAL HIGH (ref 98–111)
Creatinine, Ser: 0.94 mg/dL (ref 0.61–1.24)
GFR calc Af Amer: >60 mL/min (ref >60)
GFR calc non Af Amer: >60 mL/min (ref >60)
Glucose, Bld: 87 mg/dL (ref 70–99)
Potassium: 3.3 mmol/L — ABNORMAL LOW (ref 3.5–5.1)
Sodium: 147 mmol/L — ABNORMAL HIGH (ref 135–145)

## 2019-04-14 LAB — PATHOLOGIST SMEAR REVIEW

## 2019-04-14 LAB — COMP PANEL: LEUKEMIA/LYMPHOMA

## 2019-04-14 LAB — MAGNESIUM: Magnesium: 1.7 mg/dL (ref 1.7–2.4)

## 2019-04-14 MED ORDER — HEPARIN SODIUM (PORCINE) 5000 UNIT/ML IJ SOLN
5000.0000 [IU] | Freq: Three times a day (TID) | INTRAMUSCULAR | Status: DC
  Administered 2019-04-14 – 2019-04-21 (×20): 5000 [IU] via SUBCUTANEOUS
  Filled 2019-04-14 (×20): qty 1

## 2019-04-14 MED ORDER — VANCOMYCIN HCL IN DEXTROSE 1-5 GM/200ML-% IV SOLN
1000.0000 mg | Freq: Two times a day (BID) | INTRAVENOUS | Status: DC
  Filled 2019-04-14: qty 200

## 2019-04-14 MED ORDER — POTASSIUM CHLORIDE CRYS ER 20 MEQ PO TBCR
40.0000 meq | EXTENDED_RELEASE_TABLET | Freq: Once | ORAL | Status: AC
  Administered 2019-04-14: 40 meq via ORAL
  Filled 2019-04-14: qty 2

## 2019-04-14 MED ORDER — SODIUM CHLORIDE 0.9 % IV SOLN
INTRAVENOUS | Status: DC
  Administered 2019-04-14 – 2019-04-15 (×2): via INTRAVENOUS

## 2019-04-14 MED ORDER — MAGNESIUM SULFATE IN D5W 1-5 GM/100ML-% IV SOLN
1.0000 g | Freq: Once | INTRAVENOUS | Status: AC
  Administered 2019-04-14: 1 g via INTRAVENOUS
  Filled 2019-04-14: qty 100

## 2019-04-14 MED ORDER — SODIUM CHLORIDE 0.9 % IV BOLUS
1000.0000 mL | Freq: Once | INTRAVENOUS | Status: AC
  Administered 2019-04-14: 1000 mL via INTRAVENOUS

## 2019-04-14 NOTE — Procedures (Signed)
Patient Name: Jeremiah [name].  MRN: [id number]  Epilepsy Attending: Lora Havens  Referring Physician/Provider: Dr. Ivor Costa Date: 04/14/2019 Duration: 50 minutes  Patient history: 78 year old male with seizure disorder and worsening mental status.  EEG to evaluate for seizures.  Level of alertness: Awake  AEDs during EEG study: Lamotrigine, gabapentin  Technical aspects: This EEG study was done with scalp electrodes positioned according to the 10-20 International system of electrode placement. Electrical activity was acquired at a sampling rate of 500Hz  and reviewed with a high frequency filter of 70Hz  and a low frequency filter of 1Hz . EEG data were recorded continuously and digitally stored.   Description: During awake [name],the posterior dominant rhythm consists of 8 Hz activity of moderate voltage (25-35 uV) seen predominantly in posterior head regions, symmetric and reactive to eye opening and eye closing.  Intermittent generalized 3 to 5 Hz theta-delta slowing was seen.  Physiologic photic driving was not seen during photic stimulation.  Hyperventilation was not performed.  Abnormality -Intermittent slow, generalized  IMPRESSION: This study is suggestive of mild diffuse encephalopathy, nonspecific etiology. No seizures or epileptiform discharges were seen throughout the recording.  [name]

## 2019-04-14 NOTE — TOC Progression Note (Signed)
Transition of Care (TOC) - Progression Note    Patient Details  Name: Jeremiah [name]. MRN: [id number] Date of Birth: 09/30/1940  Transition of Care [facility]) CM/SW Contact  Beverly Sessions, RN Phone Number: 04/14/2019, 3:13 PM  Clinical Narrative:    Per MD patient not medically clear for discharge Reiterated that RNCM would like 24 hour notice of discharge in order to initiate insurance auth again for SNF   Expected Discharge Plan: [place]    Expected Discharge Plan and Services Expected Discharge Plan: [place]   Discharge Planning Services: CM Consult   Living arrangements for the past 2 months: Single Family Home Expected Discharge Date: 04/02/19                                     Social Determinants of Health (SDOH) Interventions    Readmission Risk Interventions No flowsheet data found.

## 2019-04-14 NOTE — Progress Notes (Signed)
Physical Therapy Treatment Patient Details Name: Jeremiah [name]. MRN: [id number] DOB: 11/07/1940 Today's Date: 04/14/2019    History of Present Illness  78 y.o. male with Past medical history of rheumatoid arthritis on immunosuppressive therapy, seizures, HTN, chronic lymphedema.  Went to [facility] last week with cellulitis and got antibiotics, however LE swelling had continued along with increased difficulty with mobility and strength.    PT Comments    Pt presented with deficits in strength, transfers, mobility, gait, balance, and activity tolerance.  Pt required extensive +2 assist with all bed mobility tasks and once in sitting at the EOB presented with R lateral and posterior instability requiring near constant min-mod A to prevent LOB.  Pt able to sit at EOB and participate in L lateral and anterior weight shifting activities x 8 min but was never able to maintain neutral sitting position independently, nursing notified.  Pt will benefit from PT services in a SNF setting upon discharge to safely address above deficits for decreased caregiver assistance and eventual return to PLOF.     Follow Up Recommendations  SNF     Equipment Recommendations  None recommended by PT    Recommendations for Other Services       Precautions / Restrictions Precautions Precautions: Fall Restrictions Weight Bearing Restrictions: No    Mobility  Bed Mobility Overal bed mobility: Needs Assistance Bed Mobility: Supine to Sit;Sit to Supine     Supine to sit: Max assist;+2 for physical assistance Sit to supine: Max assist;+2 for physical assistance   General bed mobility comments: Extensive verbal and tactile cues for sequencing for increased pt participation  Transfers                 General transfer comment: Unable/unsafe to attempt  Ambulation/Gait                 Stairs             Wheelchair Mobility    Modified Rankin (Stroke Patients Only)        Balance Overall balance assessment: Needs assistance Sitting-balance support: Bilateral upper extremity supported Sitting balance-Leahy Scale: Poor Sitting balance - Comments: Posterior instability in sitting requiring frequent Mod A to correct Postural control: Right lateral lean;Posterior lean   Standing balance-Leahy Scale: Poor                              Cognition Arousal/Alertness: Awake/alert Behavior During Therapy: WFL for tasks assessed/performed Overall Cognitive Status: Within Functional Limits for tasks assessed                                 General Comments: Pt mildy confused but followed commands with extra time and cuing      Exercises Total Joint Exercises Ankle Circles/Pumps: Strengthening;Both;5 reps;10 reps Quad Sets: Strengthening;Both;10 reps Heel Slides: AAROM;Both;10 reps Hip ABduction/ADduction: AAROM;Both;10 reps Straight Leg Raises: AAROM;Both;10 reps Other Exercises Other Exercises: Static sitting balance training with physical assist and cuing to achieve and maintain upright neutral position    General Comments        Pertinent Vitals/Pain Pain Assessment: No/denies pain    Home Living                      Prior Function            PT Goals (current goals can now  be found in the care plan section) Progress towards PT goals: Not progressing toward goals - comment(Pt with poor functional strength and static sitting balance)    Frequency    Min 2X/week      PT Plan Current plan remains appropriate    Co-evaluation              AM-PAC PT "6 Clicks" Mobility   Outcome Measure  Help needed turning from your back to your side while in a flat bed without using bedrails?: Total Help needed moving from lying on your back to sitting on the side of a flat bed without using bedrails?: Total Help needed moving to and from a bed to a chair (including a wheelchair)?: Total Help needed standing up  from a chair using your arms (e.g., wheelchair or bedside chair)?: Total Help needed to walk in hospital room?: Total Help needed climbing 3-5 steps with a railing? : Total 6 Click Score: 6    End of Session   Activity Tolerance: Patient tolerated treatment well Patient left: in bed;with call bell/phone within reach;with family/visitor present Nurse Communication: Mobility status PT Visit Diagnosis: Muscle weakness (generalized) (M62.81);Unsteadiness on feet (R26.81)     Time: [id number] PT Time Calculation (min) (ACUTE ONLY): 28 min  Charges:  $Therapeutic Exercise: 23-37 mins                     D. Scott Makaylie Dedeaux PT, DPT 04/14/19, 4:44 PM

## 2019-04-14 NOTE — Progress Notes (Signed)
ID Pt doing much better No fever No sob   Patient Vitals for the past 24 hrs:  BP Temp Temp src Pulse Resp SpO2 Weight  04/14/19 1505 102/61 - - 82 - - -  04/14/19 1340 (!) 89/58 97.7 F (36.5 C) Oral 85 13 98 % -  04/14/19 0855 116/67 - - 93 - - -  04/14/19 0626 129/66 98.7 F (37.1 C) Oral 91 16 100 % -  04/13/19 2038 117/61 (!) 97.5 F (36.4 C) Oral 100 18 100 % -    Awake/alert Very cheerful Chest b/l air entry HS s1s2 abd soft Legs - dressing not removed Stomatitis present       CBC Latest Ref Rng & Units 04/14/2019 04/13/2019 04/12/2019  WBC 4.0 - 10.5 K/uL 16.9(H) 3.6(L) 1.9(L)  Hemoglobin 13.0 - 17.0 g/dL 8.5(L) 8.6(L) 8.9(L)  Hematocrit 39.0 - 52.0 % 26.4(L) 26.4(L) 28.3(L)  Platelets 150 - 400 K/uL 368 192 94(L)    CMP Latest Ref Rng & Units 04/14/2019 04/13/2019 04/12/2019  Glucose 70 - 99 mg/dL 87 88 75  BUN 8 - 23 mg/dL 13 16 14   Creatinine 0.61 - 1.24 mg/dL 0.94 1.10 1.09  Sodium 135 - 145 mmol/L 147(H) 146(H) 148(H)  Potassium 3.5 - 5.1 mmol/L 3.3(L) 3.7 3.8  Chloride 98 - 111 mmol/L 115(H) 116(H) 117(H)  CO2 22 - 32 mmol/L 23 23 24   Calcium 8.9 - 10.3 mg/dL 7.8(L) 8.1(L) 8.3(L)  Total Protein 6.5 - 8.1 g/dL - - -  Total Bilirubin 0.3 - 1.2 mg/dL - - -  Alkaline Phos 38 - 126 U/L - - -  AST 15 - 41 U/L - - -  ALT 0 - 44 U/L - - -    Assessment/Plan Febrile neutropenia-has resolved - on vanco and meropenem- DC vanco today and if no fever overnight DC meropenem  Oral mucositis- likely source for fever D.D b/l lymphedema mimicking cellulitis legs which has resolved, but he does have wounds on his ankle from pressure which could have been the source as well Better  Neutropenia and thrombocytopenia resolved   Methotrexate toxicity- resolving   B/l lymphedema causing blisters with deroofing and scabbing-could have had cellulitis or could be a mimic as it was b/l Much improved   Rheumatoid arthritis   Low folate - question of  whether he was taking it  Regularly With MXT  Discussed the management with the patient

## 2019-04-14 NOTE — Progress Notes (Signed)
Pharmacy Antibiotic Note  Jeremiah [name]. is a 78 y.o. male admitted on 03/31/2019 with febrile neutropenia which was most likely due to methotrexate toxicity.  Pharmacy was consulted for vancomycin and meropenem. This is day #4 of IV antibiotics, improved leukopenia on daily Granix. Granix was stopped by Dr Mike Gip today. His renal function was initially impaired and has returned to what appears to be his baseline.   Plan: 1) change vancomycin dose to 1000 mg IV Q 12 hrs Goal AUC 400-550 Expected AUC: 527 SCr used: 0.94 Css: 31.5/15.5 mcg/mL SCr in am  2) continue meropenem 1g IV q8h    Height: 6' (182.9 cm) Weight: (bed scale not working) IBW/kg (Calculated) : 77.6  Temp (24hrs), Avg:97.9 F (36.6 C), Min:97.5 F (36.4 C), Max:98.7 F (37.1 C)  Recent Labs  Lab 04/10/19 0701 04/11/19 0842 04/12/19 0504 04/13/19 0412 04/13/19 0905 04/13/19 1054 04/14/19 0435  WBC 1.5* 1.4* 1.9* 3.6*  --   --  16.9*  CREATININE 1.01 0.99 1.09 1.10  --   --  0.94  LATICACIDVEN  --   --   --   --  1.6 1.6  --     Estimated Creatinine Clearance: 87.3 mL/min (by C-G formula based on SCr of 0.94 mg/dL).    Antimicrobials this admission: 11/21 meropenem >>  11/21 vancomycin >>   Microbiology results: 11/20 BCx: NGTD 11/21 UCx: NG  11/18 SARS CoV-2: negative   Thank you for allowing pharmacy to be a part of this patient's care.  Vallery Sa, PharmD, BCPS Clinical Pharmacist 04/14/2019 12:16 PM

## 2019-04-14 NOTE — Progress Notes (Signed)
eeg completed ° °

## 2019-04-14 NOTE — Consult Note (Signed)
Consultation Note Date: 04/14/2019   Patient Name: Jeremiah [name].  DOB: 09/19/1940  MRN: [id number]  Age / Sex: 78 y.o., male   PCP: Derinda Late, MD Referring Physician: Ivor Costa, MD   REASON FOR CONSULTATION:Establishing goals of care  Palliative Care consult requested for this 78 y.o. male with multiple medical problems including chronic lymphedema, venous stasis, rheumatoid arthritis (Plaquenil, MTX, Enbrel), and multiple spine surgeries.  He was recently seen at [facility] (03/26/2019) for he was started on Cipro and clindamycin for bilateral lower extremity cellulitis.  He presented to the ED with complaints of failed outpatient therapy for his lower extremity cellulitis.  Note patient was also found to have pressure ulcers located on his bilateral ankles, buttocks, and mid sacrum.  This admission patient has been followed by oncology s/p bone marrow biopsy for work-up of severe pancytopenia, thrombocytopenia, leukopenia which is felt to be due to myelosuppression from methotrexate.  Is also being followed by ID due to oral mucositis and neutropenia.  Continues to have poor oral nutrition intake and is being followed by dietitian.  Palliative medicine team consulted for goals of care.  Clinical Assessment and Goals of Care: I have reviewed medical records including lab results, imaging, Epic notes, and MAR, received report from the bedside RN, and assessed the patient. I spoke via phone with patient's daughter-in-law, Braeson Rupe to discuss diagnosis prognosis, [place], EOL wishes, disposition and options. Per family Alyse Low is the best family contact for patient. Mr. Cunnington is awake, alert to self only, unable to appropriately identify date, time, location, year, or why he is hospitalized. Denies pain.    I introduced Palliative Medicine as specialized medical care for people living with serious illness. It focuses on providing relief from the symptoms and stress of a serious illness.  The goal is to improve quality of life for both the patient and the family. Christy verbalized understanding and appreciation.   We discussed a brief life review of the patient, along with his functional and nutritional status.  Christy married to patient's son and is one of his main caregivers.  He reports patient resided in the home with his wife of more than 43 years prior to admission.  She reports she and her husband along with a another brother lives next door and is there daily to assist with care.  Mr. Murrillo has 4 sons.  He is a former Curator.  Prior to admission patient was able to perform some ADLs with assistance from family and wife.  Daughter-in-law reports he had a great appetite and would often eat 3 meals a day and snack frequently.  She reports patient was mainly wheelchair-bound due to long history of back surgeries, lymphedema, and ulcerations to his lower extremities.  We discussed His current illness and what it means in the larger context of His on-going co-morbidities. With specific discussions regarding severe pancytopenia, neutropenia, altered mental status, and his overall decline in functional and nutritional state.  Natural disease trajectory and expectations at EOL were discussed.  Christy verbalized her understanding of patient's current illness and ongoing comorbidities.  She reports family has been in discussion over the past several days in regards to his care and condition.  She reports family mutually is awaiting for results from the oncology team and they are hopeful for good news. She reports family all have mutually expressed they remain hopeful patient will show some improvement, increased strength, and be able to return home at some point  with family support.   I attempted to discuss with Alyse Low best case and worst case scenario and concerns for patient's AMS, poor po intake, and overall health decline. She notes although he has shown signs of decline family  continues to remain hopeful as he "generally will bounce back!"   Alyse Low reports that she speaks on behalf of patient's son and wife in awareness based on their discussions that all are remaining hopeful, request that he be evaluated and possibly placed at SNF for continued support to improve his strength and allow him every opportunity to show some improvement. If no improvements they would then have to address from there but at this time they are not interested in anything other than working to get him somewhat better or at least to a state to where he can return home with family.   She shares how prior to admission he had a happy, goofy, fun loving personality. She reports minimal concerns with his behaviors, some forgetfulness but able to engage in conversations appropriately.   I attempted to elicit values and goals of care important to the patient.    Per [place] family is requesting to continue to treat the treatable with hopes of some improvement even if there is a new norm. Family is hopeful he has much more life left.   Advanced directives, concepts specific to code status, artifical feeding and hydration, and rehospitalization were considered and discussed. Alyse Low reports patient does not have a documented advanced directive.  We discussed in detail his full code status with consideration to his current illness and co-morbidities. Christy verbalized understanding expressing family wishes to continue with full code status and would want all interventions done in the event of emergency.  Palliative Care services outpatient were explained and offered. Patient and family verbalized their understanding and awareness of palliative's goals and philosophy of care. Alyse Low states they may be interested however, unable to make a decision at this time, as family most focused on obtaining results from Oncology and focusing on getting patient better.   Questions and concerns were addressed.  Hard  Choices booklet left for review. The family was encouraged to call with questions or concerns.  PMT will continue to support holistically.   SOCIAL HISTORY:     reports that he has quit smoking. His smoking use included cigarettes. He has a 20.00 pack-year smoking history. He has never used smokeless tobacco. He reports that he does not drink alcohol.  CODE STATUS: Full code  ADVANCE DIRECTIVES: Next of Kin, per family Alyse Low (daughter in law) is main contact  [phone]  SYMPTOM MANAGEMENT: per attending   Palliative Prophylaxis:   Aspiration, Bowel Regimen, Delirium Protocol, Frequent Pain Assessment, Oral Care, Palliative Wound Care and Turn Reposition  PSYCHO-SOCIAL/SPIRITUAL:  Support System:Family   Desire for further Chaplaincy support:NO   Additional Recommendations (Limitations, Scope, Preferences):  Full Scope Treatment   PAST MEDICAL HISTORY: Past Medical History:  Diagnosis Date   Allergy    Arthritis    Hypertension    Rheumatoid arthritis ([place])    Seizures ([place])    Sepsis ([place]) 05/23/2015    PAST SURGICAL HISTORY:  Past Surgical History:  Procedure Laterality Date   back surgery five times  04/2016   CARPAL TUNNEL RELEASE     HERNIA REPAIR     JOINT REPLACEMENT     knee replacement right     right hip replacement     rotator cuff surgery     SPINE SURGERY  ALLERGIES:  is allergic to cefoperazone; penicillins; and latex.   MEDICATIONS:  Current Facility-Administered Medications  Medication Dose Route Frequency Provider Last Rate Last Dose   0.9 %  sodium chloride infusion   Intravenous PRN Lavina Hamman, MD   Stopped at 04/12/19 1011   0.9 %  sodium chloride infusion   Intravenous Continuous Ivor Costa, MD 100 mL/hr at 04/13/19 2156     acetaminophen (TYLENOL) tablet 650 mg  650 mg Oral Q6H PRN Lavina Hamman, MD   650 mg at 04/12/19 2235   Or   acetaminophen (TYLENOL) suppository 650 mg  650 mg Rectal Q6H PRN Lavina Hamman, MD       aspirin chewable tablet 81 mg  81 mg Oral Daily Lavina Hamman, MD   81 mg at 04/14/19 0960   Chlorhexidine Gluconate Cloth 2 % PADS 6 each  6 each Topical Daily Lavina Hamman, MD   6 each at 04/13/19 1000   feeding supplement (ENSURE ENLIVE) (ENSURE ENLIVE) liquid 237 mL  237 mL Oral TID BM Ivor Costa, MD   237 mL at 04/14/19 0857   fentaNYL (SUBLIMAZE) injection   Intravenous PRN Aletta Edouard, MD   25 mcg at 45/40/98 1191   folic acid (FOLVITE) tablet 1 mg  1 mg Oral Daily Nolon Stalls C, MD   1 mg at 04/14/19 0855   gabapentin (NEURONTIN) capsule 200 mg  200 mg Oral TID Ivor Costa, MD   200 mg at 04/14/19 0852   heparin injection 5,000 Units  5,000 Units Subcutaneous Q8H Ivor Costa, MD       lamoTRIgine (LAMICTAL) tablet 200 mg  200 mg Oral BID Lavina Hamman, MD   200 mg at 04/14/19 4782   lamoTRIgine (LAMICTAL) tablet 25 mg  25 mg Oral BID Lavina Hamman, MD   25 mg at 04/14/19 0857   lip balm (BLISTEX) ointment   Topical PRN Marcell Anger, MD       loratadine (CLARITIN) tablet 10 mg  10 mg Oral Daily Ivor Costa, MD   10 mg at 04/14/19 0854   LORazepam (ATIVAN) injection 1 mg  1 mg Intravenous Q12H PRN Ivor Costa, MD   1 mg at 04/11/19 0003   magic mouthwash w/lidocaine  5 mL Oral TID PRN Ivor Costa, MD       MEDLINE mouth rinse  15 mL Mouth Rinse BID Lavina Hamman, MD   15 mL at 04/14/19 0857   meropenem (MERREM) 1 g in sodium chloride 0.9 % 100 mL IVPB  1 g Intravenous Q8H Ravishankar, Joellyn Quails, MD 200 mL/hr at 04/14/19 0531 1 g at 04/14/19 0531   metoprolol tartrate (LOPRESSOR) tablet 12.5 mg  12.5 mg Oral BID Marcell Anger, MD   12.5 mg at 04/14/19 0856   midazolam (VERSED) injection   Intravenous PRN Aletta Edouard, MD   1 mg at 04/10/19 9562   multivitamin with minerals tablet 1 tablet  1 tablet Oral Daily Ivor Costa, MD   1 tablet at 04/14/19 0856   ondansetron (ZOFRAN) tablet 4 mg  4 mg Oral Q6H PRN Lavina Hamman, MD       Or   ondansetron [facility]) injection 4 mg  4 mg Intravenous Q6H PRN Lavina Hamman, MD       oxyCODONE (Oxy IR/ROXICODONE) immediate release tablet 5 mg  5 mg Oral Q6H PRN Lavina Hamman, MD   5 mg at 04/12/19 2234  pantoprazole (PROTONIX) EC tablet 40 mg  40 mg Oral Daily Lavina Hamman, MD   40 mg at 04/14/19 0854   polyethylene glycol (MIRALAX / GLYCOLAX) packet 17 g  17 g Oral Daily PRN Lavina Hamman, MD       polyethylene glycol (MIRALAX / GLYCOLAX) packet 17 g  17 g Oral Once Lavina Hamman, MD       potassium chloride SA (KLOR-CON) CR tablet 40 mEq  40 mEq Oral Once Ivor Costa, MD       sodium chloride flush (NS) 0.9 % injection 3 mL  3 mL Intravenous Q12H Lavina Hamman, MD   3 mL at 04/14/19 0858   traZODone (DESYREL) tablet 50 mg  50 mg Oral QHS Lavina Hamman, MD   50 mg at 04/14/19 0150   vitamin C (ASCORBIC ACID) tablet 500 mg  500 mg Oral BID Ivor Costa, MD   500 mg at 04/14/19 0855    VITAL SIGNS: BP 116/67    Pulse 93    Temp 98.7 F (37.1 C) (Oral)    Resp 16    Ht 6' (1.829 m)    Wt 118.2 kg    SpO2 100%    BMI 35.34 kg/m  Filed Weights   04/09/19 0500 04/10/19 0500  Weight: 112.9 kg 118.2 kg    Estimated body mass index is 35.34 kg/m as calculated from the following:   Height as of this encounter: 6' (1.829 m).   Weight as of this encounter: 118.2 kg.  LABS: CBC:    Component Value Date/Time   WBC 16.9 (H) 04/14/2019 0435   HGB 8.5 (L) 04/14/2019 0435   HCT 26.4 (L) 04/14/2019 0435   PLT 368 04/14/2019 0435   Comprehensive Metabolic Panel:    Component Value Date/Time   NA 147 (H) 04/14/2019 0435   NA 133 (L) 01/14/2017 1048   K 3.3 (L) 04/14/2019 0435   CO2 23 04/14/2019 0435   BUN 13 04/14/2019 0435   BUN 11 01/14/2017 1048   CREATININE 0.94 04/14/2019 0435   ALBUMIN 2.8 (L) 04/01/2019 0536   ALBUMIN 3.8 01/14/2017 1048     Review of Systems  Unable to perform ROS: Mental status change  Unless otherwise  noted, a complete review of systems is negative.  Physical Exam General: NAD, chronically-ill appearing HEENT: scab noted to lip, oral mucositis Cardiovascular: regular rate and rhythm Pulmonary: clear ant fields Abdomen: soft, nontender, + bowel sounds Extremities: no edema, no joint deformities Skin: bilateral lower extremity cellulitis, ankle/heel dressings intact, reported sacral ulcerations (not observed), scattered bruising  Neurological: awake, alert to self only, pleasantly confused   Prognosis: Guarded to Poor   Discharge Planning:  To Be Determined family at this time is requesting SNF   Recommendations:  Full Code-as confirmed by family  Full scope/aggressive medical interventions  Continue with current plan of care per medical team  Per daughter-in-law Alyse Low, family remains hopeful for improvement, SNF placement for further opportunity to improve or some improvement if new baseline.   PMT will continue to support and follow as needed.    Palliative Performance Scale: PPS 20-30%              Christy expressed understanding and was in agreement with this plan.   Thank you for allowing the Palliative Medicine Team to assist in the care of this patient.  Time In: 7035 Time Out: 1330 Time Total:45 min.   Visit consisted of counseling and education  dealing with the complex and emotionally intense issues of symptom management and palliative care in the setting of serious and potentially life-threatening illness.Greater than 50%  of this time was spent counseling and coordinating care related to the above assessment and plan.  Signed by:  Alda Lea, AGPCNP-BC Palliative Medicine Team  Phone: [phone] Fax: [phone] Pager: [phone] Amion: Bjorn Pippin

## 2019-04-14 NOTE — Progress Notes (Signed)
PROGRESS NOTE    Jeremiah [name].  KF:[id number] DOB: Sep 28, 1940 DOA: 03/31/2019 PCP: Derinda Late, MD    Brief Narrative:  Jeremiah [name]. is a 78 y.o. male with Past medical history of rheumatoid arthritis on immunosuppressive therapy, seizures, HTN, chronic lymphedema. Patient presented with complaints of worsening leg infection. Patient was seen on 03/26/2019 in the ED at [facility] for cellulitis of the leg.  Patient was discharged on oral Cipro and clindamycin. Patient was taking all his medication up to this many days and was compliant with it. There was an area of infection and redness which was marked. Since last 2 days patient's pain was worsening and that area of redness across the marked line and therefore patient decided to come to the hospital. No nausea no vomiting no fever no chills no chest pain abdominal pain no diarrhea no constipation. Patient remains compliant with all his medication. Continue to take all his immunosuppressive medication throughout this infection course.  ED Course: Presents with above complaint.  And referred for admission for failure for outpatient therapy.  At his baseline ambulates with assistance independent for most of his ADL;  manages his medication on his own.  Interim History: - 11/20 CT guided bone marrow aspiration and biopsy - 11/20: due to fever with neutropenia, Dr. Steva Ready of ID started patient with vancomycin and meropenem, pending Bx and Ux - 11/20: Tbo-Filgrastim (GRANIX) injection 300 mcg was given - 11/20: neuro was consulted for AMS. CT-head is a limited study, but did not showed acute intracranial abnormalities. - 11/23: spiked fever at 22:11 on 11/22 --> consulted ID again. - 11/24: bp is soft in AM, give 1L NS, then 100 cc/h NS - 11/24 EEG: this study is suggestive of mild diffuse encephalopathy, nonspecific etiology. No seizures or epileptiform discharges were seen throughout the recording. - 11/24: WBC 16.9  and Platelet 368, neutropenia and thrombocytopenia resolved   CBC Latest Ref Rng & Units 04/14/2019 04/13/2019 04/12/2019  WBC 4.0 - 10.5 K/uL 16.9(H) 3.6(L) 1.9(L)  Hemoglobin 13.0 - 17.0 g/dL 8.5(L) 8.6(L) 8.9(L)  Hematocrit 39.0 - 52.0 % 26.4(L) 26.4(L) 28.3(L)  Platelets 150 - 400 K/uL 368 192 94(L)    Subjective: Has bilateral leg and foot edema, lymphedema and mild leg tenterness. No CP, AP, fever or chills.  No nausea, vomiting, diarrhea or abdominal pain. Pt is intermittently confused, this AM he is place and knows his own name.   Assessment & Plan:   Principal Problem:   Neutropenia with fever (HCC) Active Problems:   Rheumatoid arthritis (HCC)   Seizure (HCC)   Pressure injury of skin   Other pancytopenia (HCC)   Folic acid deficiency   Cellulitis of lower extremity   AKI (acute kidney injury) ([place])   Pancytopenia (HCC)   Acute metabolic encephalopathy   Oral mucositis  Neutropenia with fever:  Dr. Delaine Lame of infectious disease was consulted.  Started the patient with vancomycin and meropenem 11/20. Spiked fever at 22:11 on 11/22 --> consulted ID again. Pt has oeral mucositis. WBC 16.9 (pt received one dose of Tbo-Filgrastim (GRANIX) injection 300 mcg 11/20). No fever.  -highly appreciate Dr. Gwenevere Ghazi recommendation. -Vancomycin and meropenem -->dc/d vancomycin per Dr. Ramon Dredge 11/24. -Follow-up blood culture and urine culture -->so far no grow -Neutropenic precaution  Acute metabolic encephalopathy: Patient has been intermittently confused, etiology is not clear.  Differential diagnoses include delirium.  CT head was done to make sure pt does not have intracranial bleeding given thrombocytopenia, which is  a limited study, but did not showed acute intracranial abnormalities.  Neurology, Dr. Doy Mince was consulted, highly appreciated Dr. Doy Mince consultation.   -Follow-up recommendations as follows:  1.  B1 level, CMET, serum ammonia --> ammonia level  normal.  2. EEG --> 11/24, this study is suggestive of mild diffuse encephalopathy, nonspecific etiology. No seizures or epileptiform discharges were seen throughout the recording.  3. MRI of the brain with and without contrast -->negagive for acute issues   4. Consider decrease in Neurontin to 200mg  TID if EEG unremarkable -->aleady changed  -Frequent neuro checks  Cellulitis of lower extremity superimposed on venous stasis changes associated lymphedema: blood culture no grow so far. Pt developed severe pancytopenia. ID was consulted 11/19. BNP 27. -Doxycycline stopped 11/19 -f/u Bx  New pancytopenia with neutropenia: improved significantly. Unclear etiology. Likely due to mtx toxicity.  Heme-onc was consulted, oncology is on board, highly appreciated. 11/20 CT guided bone marrow aspiration and biopsy was done by IR. WBC 16.9 and platelet 368. neutropenia and thrombocytopenia resolved 11/24.  -neutropenic [id number]  -on folic acid -iron study showed  -Tbo-Filgrastim (GRANIX) injection 300 mcg was given by oncology 11/20  CBC Latest Ref Rng & Units 04/14/2019 04/13/2019 04/12/2019  WBC 4.0 - 10.5 K/uL 16.9(H) 3.6(L) 1.9(L)  Hemoglobin 13.0 - 17.0 g/dL 8.5(L) 8.6(L) 8.9(L)  Hematocrit 39.0 - 52.0 % 26.4(L) 26.4(L) 28.3(L)  Platelets 150 - 400 K/uL 368 192 94(L)    Acute kidney injury: resolved.  -f/u by BMP  Rheumatoid arthritis:continueholding immunosuppressants currently, heme-onc as noted concerned about methotrexate toxicity  Neuropathy:  -Continue gabapentin -->decreased dose from 300 4 time per day to 200 mg tid on 11/22  Seizure disorder: no evidence of breakthrough seizures -ContinueLamictal -seizure precaution  Pressure ulcers present on admission. These were bilateral buttock as well as mid sacrum: Wound care was consultedappreciate their recommendations. -Continue with silver Hydrofiber, and foam changing every 3 days and as needed for  soilage  DVT prophylaxis: SCD -->sarted sq Heparin Code Status: full Family Communication: not today Disposition Plan: not ready for discharge. Patient remains severely deconditioned/ debilitated. Still need IV antibiotics.    Consultants:  Infectious disease ID Oncology IR  Procedures: 11/20: CT guided bone marrow aspiration and biopsy  Antimicrobials: Anti-infectives (From admission, onward)   Start     Dose/Rate Route Frequency Ordered Stop   04/14/19 2000  vancomycin (VANCOCIN) IVPB 1000 mg/200 mL premix  Status:  Discontinued     1,000 mg 200 mL/hr over 60 Minutes Intravenous Every 12 hours 04/14/19 1303 04/14/19 1318   04/12/19 0000  vancomycin (VANCOCIN) 1,750 mg in sodium chloride 0.9 % 500 mL IVPB  Status:  Discontinued     1,750 mg 250 mL/hr over 120 Minutes Intravenous Every 24 hours 04/11/19 0508 04/14/19 1303   04/11/19 0000  vancomycin (VANCOCIN) 2,000 mg in sodium chloride 0.9 % 500 mL IVPB     2,000 mg 250 mL/hr over 120 Minutes Intravenous  Once 04/10/19 2346 04/11/19 0228   04/11/19 0000  meropenem (MERREM) 1 g in sodium chloride 0.9 % 100 mL IVPB     1 g 200 mL/hr over 30 Minutes Intravenous Every 8 hours 04/10/19 2346     04/03/19 1230  doxycycline (VIBRA-TABS) tablet 100 mg  Status:  Discontinued     100 mg Oral Every 12 hours 04/03/19 1224 04/09/19 1547   04/02/19 1500  vancomycin (VANCOCIN) 1,250 mg in sodium chloride 0.9 % 250 mL IVPB  Status:  Discontinued  1,250 mg 166.7 mL/hr over 90 Minutes Intravenous Every 48 hours 03/31/19 1902 04/01/19 0844   04/02/19 0800  vancomycin (VANCOCIN) 1,250 mg in sodium chloride 0.9 % 250 mL IVPB  Status:  Discontinued     1,250 mg 166.7 mL/hr over 90 Minutes Intravenous Every 36 hours 04/01/19 0844 04/03/19 1224   04/02/19 0000  minocycline (MINOCIN) 100 MG capsule     100 mg Oral 2 times daily 04/02/19 1254 04/12/19 2359   03/31/19 1830  vancomycin (VANCOCIN) IVPB 1000 mg/200 mL premix     1,000 mg 200 mL/hr  over 60 Minutes Intravenous  Once 03/31/19 1819 03/31/19 2209   03/31/19 1445  vancomycin (VANCOCIN) IVPB 1000 mg/200 mL premix     1,000 mg 200 mL/hr over 60 Minutes Intravenous  Once 03/31/19 1436 03/31/19 1643         Objective: Vitals:   04/14/19 0855 04/14/19 1340 04/14/19 1505 04/14/19 2034  BP: 116/67 (!) 89/58 102/61 120/78  Pulse: 93 85 82 (!) 101  Resp:  13  16  Temp:  97.7 F (36.5 C)  98.4 F (36.9 C)  TempSrc:  Oral  Oral  SpO2:  98%  91%  Weight:      Height:        Intake/Output Summary (Last 24 hours) at 04/14/2019 2152 Last data filed at 04/14/2019 1925 Gross per 24 hour  Intake 1912 ml  Output 650 ml  Net 1262 ml   Filed Weights   04/09/19 0500 04/10/19 0500  Weight: 112.9 kg 118.2 kg    Examination:  Physical Exam:  General: Not in acute distress HEENT: PERRL, EOMI, no scleral icterus, No JVD or bruit Cardiac: S1/S2, RRR, No murmurs, gallops or rubs Pulm:  No rales, wheezing, rhonchi or rubs. Abd: Soft, nondistended, nontender, no rebound pain, no organomegaly, BS present Ext: has bilateral leg and foot edema and lymphedema. 1+DP/PT pulse bilaterally Musculoskeletal: No joint deformities, erythema, or stiffness, ROM full Skin: No rashes.  Neuro: Pt is confused, knows his own name, but not oriented x 3. cranial nerves II-XII grossly intact, moves all extremeties normally. Psych: Patient is not psychotic, no suicidal or hemocidal ideation.    Data Reviewed: I have personally reviewed following labs and imaging studies  CBC: Recent Labs  Lab 04/10/19 0701 04/11/19 0842 04/12/19 0504 04/13/19 0412 04/14/19 0435  WBC 1.5* 1.4* 1.9* 3.6* 16.9*  NEUTROABS 0.2* 0.1* 0.1* 0.8* 6.7  HGB 9.7* 9.1* 8.9* 8.6* 8.5*  HCT 31.0* 28.8* 28.3* 26.4* 26.4*  MCV 90.6 89.4 89.6 85.7 88.3  PLT 23* 47* 94* 192 [id number]   Basic Metabolic Panel: Recent Labs  Lab 04/10/19 0701 04/11/19 0842 04/12/19 0504 04/13/19 0412 04/14/19 0435  NA 145 145 148*  146* 147*  K 3.5 3.4* 3.8 3.7 3.3*  CL 110 112* 117* 116* 115*  CO2 25 25 24 23 23   GLUCOSE 85 95 75 88 87  BUN 18 17 14 16 13   CREATININE 1.01 0.99 1.09 1.10 0.94  CALCIUM 8.6* 8.2* 8.3* 8.1* 7.8*  MG  --   --   --  2.0 1.7   GFR: Estimated Creatinine Clearance: 87.3 mL/min (by C-G formula based on SCr of 0.94 mg/dL). Liver Function Tests: No results for input(s): AST, ALT, ALKPHOS, BILITOT, PROT, ALBUMIN in the last 168 hours. No results for input(s): LIPASE, AMYLASE in the last 168 hours. Recent Labs  Lab 04/12/19 1609  AMMONIA 10   Coagulation Profile: No results for input(s): INR, PROTIME in the  last 168 hours. Cardiac Enzymes: No results for input(s): CKTOTAL, CKMB, CKMBINDEX, TROPONINI in the last 168 hours. BNP (last 3 results) No results for input(s): PROBNP in the last 8760 hours. HbA1C: No results for input(s): HGBA1C in the last 72 hours. CBG: No results for input(s): GLUCAP in the last 168 hours. Lipid Profile: No results for input(s): CHOL, HDL, LDLCALC, TRIG, CHOLHDL, LDLDIRECT in the last 72 hours. Thyroid Function Tests: No results for input(s): TSH, T4TOTAL, FREET4, T3FREE, THYROIDAB in the last 72 hours. Anemia Panel: Recent Labs    04/12/19 0504  FERRITIN 368*  TIBC 119*  IRON 16*   Sepsis Labs: Recent Labs  Lab 04/13/19 0905 04/13/19 1054  PROCALCITON 0.38  --   LATICACIDVEN 1.6 1.6    Recent Results (from the past 240 hour(s))  Culture, blood (x 2)     Status: None   Collection Time: 04/05/19 11:17 AM   Specimen: BLOOD  Result Value Ref Range Status   Specimen Description BLOOD LEFT ANTECUBITAL  Final   Special Requests   Final    BOTTLES DRAWN AEROBIC AND ANAEROBIC Blood Culture adequate volume   Culture   Final    NO GROWTH 5 DAYS Performed at [facility], [address], [place], [place] [address]    Report Status 04/10/2019 FINAL  Final  Culture, blood (x 2)     Status: None   Collection Time: 04/05/19 11:25 AM    Specimen: BLOOD  Result Value Ref Range Status   Specimen Description BLOOD RIGHT ANTECUBITAL  Final   Special Requests   Final    BOTTLES DRAWN AEROBIC AND ANAEROBIC Blood Culture adequate volume   Culture   Final    NO GROWTH 5 DAYS Performed at [facility], [address], [place], [place] [address]    Report Status 04/10/2019 FINAL  Final  SARS CORONAVIRUS 2 (TAT 6-24 HRS) Nasopharyngeal Nasopharyngeal Swab     Status: None   Collection Time: 04/08/19  2:04 PM   Specimen: Nasopharyngeal Swab  Result Value Ref Range Status   SARS Coronavirus 2 NEGATIVE NEGATIVE Final    Comment: (NOTE) SARS-CoV-2 target nucleic acids are NOT DETECTED. The SARS-CoV-2 RNA is generally detectable in upper and lower respiratory specimens during the acute phase of infection. Negative results do not preclude SARS-CoV-2 infection, do not rule out co-infections with other pathogens, and should not be used as the sole basis for treatment or other patient management decisions. Negative results must be combined with clinical observations, patient history, and epidemiological information. The expected result is Negative. Fact Sheet for Patients: SugarRoll.be Fact Sheet for Healthcare Providers: https://www.woods-mathews.com/ This test is not yet approved or cleared by the [place] FDA and  has been authorized for detection and/or diagnosis of SARS-CoV-2 by FDA under an Emergency Use Authorization (EUA). This EUA will remain  in effect (meaning this test can be used) for the duration of the COVID-19 declaration under Section 56 4(b)(1) of the Act, 21 U.S.C. section 360bbb-3(b)(1), unless the authorization is terminated or revoked sooner. Performed at [facility], [place] [address] [place], [place] [address]   CULTURE, BLOOD (ROUTINE X 2) w Reflex to ID Panel     Status: None (Preliminary result)   Collection Time: 04/10/19 11:55 PM    Specimen: BLOOD  Result Value Ref Range Status   Specimen Description BLOOD RIGHT ANTECUBITAL  Final   Special Requests   Final    BOTTLES DRAWN AEROBIC AND ANAEROBIC Blood Culture  adequate volume   Culture   Final    NO GROWTH 2 DAYS Performed at [facility], [address] [place], [place] [address]    Report Status PENDING  Incomplete  CULTURE, BLOOD (ROUTINE X 2) w Reflex to ID Panel     Status: None (Preliminary result)   Collection Time: 04/10/19 11:59 PM   Specimen: BLOOD  Result Value Ref Range Status   Specimen Description BLOOD RIGHT ARM  Final   Special Requests   Final    BOTTLES DRAWN AEROBIC AND ANAEROBIC Blood Culture adequate volume   Culture   Final    NO GROWTH 2 DAYS Performed at [facility], [address], [place], [place] [address]    Report Status PENDING  Incomplete  Urine Culture     Status: None   Collection Time: 04/11/19  2:00 AM   Specimen: Urine, Random  Result Value Ref Range Status   Specimen Description   Final    URINE, RANDOM Performed at [facility], [address], [place], [place] [address]    Special Requests   Final    NONE Performed at [facility], [address] [place], [place] [address]    Culture   Final    NO GROWTH Performed at [facility], [place] [address], [place], [place] [address]    Report Status 04/12/2019 FINAL  Final     RN Pressure Injury Documentation: Pressure Injury 04/03/19 Sacrum (Active)  04/03/19 1653  Location: Sacrum  Location Orientation:   Staging:   Wound Description (Comments):   Present on Admission: Yes     Pressure Injury Heel Left;Posterior;Right Unstageable - Full thickness tissue loss in which the base of the ulcer is covered by slough (yellow, tan, gray, green or brown) and/or eschar (tan, brown or black) in the wound bed. (Active)     Location: Heel  Location Orientation: Left;Posterior;Right  Staging: Unstageable - Full thickness tissue  loss in which the base of the ulcer is covered by slough (yellow, tan, gray, green or brown) and/or eschar (tan, brown or black) in the wound bed.  Wound Description (Comments):   Present on Admission:      Pressure Injury 04/07/19 Thigh Right;Medial Stage II -  Partial thickness loss of dermis presenting as a shallow open ulcer with a red, pink wound bed without slough. (Active)  04/07/19 1309  Location: Thigh  Location Orientation: Right;Medial  Staging: Stage II -  Partial thickness loss of dermis presenting as a shallow open ulcer with a red, pink wound bed without slough.  Wound Description (Comments):   Present on Admission: No       Radiology Studies: No results found.      Scheduled Meds:  aspirin  81 mg Oral Daily   feeding supplement (ENSURE ENLIVE)  237 mL Oral TID BM   folic acid  1 mg Oral Daily   gabapentin  200 mg Oral TID   heparin injection (subcutaneous)  5,000 Units Subcutaneous Q8H   lamoTRIgine  200 mg Oral BID   lamoTRIgine  25 mg Oral BID   loratadine  10 mg Oral Daily   mouth rinse  15 mL Mouth Rinse BID   metoprolol tartrate  12.5 mg Oral BID   multivitamin with minerals  1 tablet Oral Daily   pantoprazole  40 mg Oral Daily   polyethylene glycol  17 g Oral Once   sodium chloride flush  3 mL Intravenous Q12H  traZODone  50 mg Oral QHS   vitamin C  500 mg Oral BID   Continuous Infusions:  sodium chloride Stopped (04/12/19 1011)   sodium chloride Stopped (04/14/19 0848)   sodium chloride Stopped (04/14/19 1445)   meropenem (MERREM) IV 1 g (04/14/19 2047)     LOS: 13 days    Time spent: 30 min     Ivor Costa, DO [facility] PAGER is on AMION  If 7PM-7AM, please contact night-coverage [email or web address] Password [facility] 04/14/2019, 9:52 PM

## 2019-04-15 DIAGNOSIS — D72829 Elevated white blood cell count, unspecified: Secondary | ICD-10-CM

## 2019-04-15 DIAGNOSIS — D61811 Other drug-induced pancytopenia: Secondary | ICD-10-CM

## 2019-04-15 LAB — CBC WITH DIFFERENTIAL/PLATELET
Abs Immature Granulocytes: 12.41 10*3/uL — ABNORMAL HIGH (ref 0.00–0.07)
Basophils Absolute: 0.1 10*3/uL (ref 0.0–0.1)
Basophils Relative: 0 %
Eosinophils Absolute: 0.9 10*3/uL — ABNORMAL HIGH (ref 0.0–0.5)
Eosinophils Relative: 2 %
HCT: 25.7 % — ABNORMAL LOW (ref 39.0–52.0)
Hemoglobin: 8.2 g/dL — ABNORMAL LOW (ref 13.0–17.0)
Immature Granulocytes: 33 %
Lymphocytes Relative: 13 %
Lymphs Abs: 4.8 10*3/uL — ABNORMAL HIGH (ref 0.7–4.0)
MCH: 27.9 pg (ref 26.0–34.0)
MCHC: 31.9 g/dL (ref 30.0–36.0)
MCV: 87.4 fL (ref 80.0–100.0)
Monocytes Absolute: 3.5 10*3/uL — ABNORMAL HIGH (ref 0.1–1.0)
Monocytes Relative: 9 %
Neutro Abs: 16.1 10*3/uL — ABNORMAL HIGH (ref 1.7–7.7)
Neutrophils Relative %: 43 %
Platelets: 502 10*3/uL — ABNORMAL HIGH (ref 150–400)
RBC: 2.94 MIL/uL — ABNORMAL LOW (ref 4.22–5.81)
RDW: 18.9 % — ABNORMAL HIGH (ref 11.5–15.5)
WBC: 37.7 10*3/uL — ABNORMAL HIGH (ref 4.0–10.5)
nRBC: 1.7 % — ABNORMAL HIGH (ref 0.0–0.2)

## 2019-04-15 LAB — BASIC METABOLIC PANEL
Anion gap: 5 (ref 5–15)
BUN: 10 mg/dL (ref 8–23)
CO2: 25 mmol/L (ref 22–32)
Calcium: 7.9 mg/dL — ABNORMAL LOW (ref 8.9–10.3)
Chloride: 117 mmol/L — ABNORMAL HIGH (ref 98–111)
Creatinine, Ser: 1 mg/dL (ref 0.61–1.24)
GFR calc Af Amer: >60 mL/min (ref >60)
GFR calc non Af Amer: >60 mL/min (ref >60)
Glucose, Bld: 101 mg/dL — ABNORMAL HIGH (ref 70–99)
Potassium: 3.7 mmol/L (ref 3.5–5.1)
Sodium: 147 mmol/L — ABNORMAL HIGH (ref 135–145)

## 2019-04-15 LAB — GLUCOSE, CAPILLARY: Glucose-Capillary: 91 mg/dL (ref 70–99)

## 2019-04-15 MED ORDER — SODIUM CHLORIDE 0.45 % IV SOLN
INTRAVENOUS | Status: DC
  Administered 2019-04-15 – 2019-04-17 (×4): via INTRAVENOUS

## 2019-04-15 NOTE — TOC Progression Note (Signed)
Transition of Care (TOC) - Progression Note    Patient Details  Name: Jeremiah [name]. MRN: [id number] Date of Birth: 10-Mar-1941  Transition of Care [facility]) CM/SW Contact  Beverly Sessions, RN Phone Number: 04/15/2019, 4:32 PM  Clinical Narrative:    Per MD note patient to follow up with outpatient hemonc at discharge.  Per [facility] He can go to the appointment, however he can not receive any radiation of CA medications due to reimbursement  MD and oncology sent secure chat with this information    Expected Discharge Plan: [place]    Expected Discharge Plan and Services Expected Discharge Plan: [place]   Discharge Planning Services: CM Consult   Living arrangements for the past 2 months: Single Family Home Expected Discharge Date: 04/02/19                                     Social Determinants of Health (SDOH) Interventions    Readmission Risk Interventions No flowsheet data found.

## 2019-04-15 NOTE — Progress Notes (Addendum)
PROGRESS NOTE    Jeremiah [name].  [id number] DOB: 1941/01/24 DOA: 03/31/2019 PCP: Derinda Late, MD    Brief Narrative:  Jeremiah [name]. is a 78 y.o. male with Past medical history of rheumatoid arthritis on immunosuppressive therapy, seizures, HTN, chronic lymphedema. Patient presented with complaints of worsening leg infection. Patient was seen on 03/26/2019 in the ED at [facility] for cellulitis of the leg.  Patient was discharged on oral Cipro and clindamycin. Patient was taking all his medication up to this many days and was compliant with it. There was an area of infection and redness which was marked. Since last 2 days patient's pain was worsening and that area of redness across the marked line and therefore patient decided to come to the hospital. No nausea no vomiting no fever no chills no chest pain abdominal pain no diarrhea no constipation. Patient remains compliant with all his medication. Continue to take all his immunosuppressive medication throughout this infection course.  ED Course: Presents with above complaint.  And referred for admission for failure for outpatient therapy.  At his baseline ambulates with assistance independent for most of his ADL;  manages his medication on his own.  Interim History: - 11/20 CT guided bone marrow aspiration and biopsy - 11/20: due to fever with neutropenia, Dr. Steva Ready of ID started patient with vancomycin and meropenem, pending Bx and Ux - 11/20: Tbo-Filgrastim (GRANIX) injection 300 mcg was given - 11/20: neuro was consulted for AMS. CT-head is a limited study, but did not showed acute intracranial abnormalities. - 11/23: spiked fever at 22:11 on 11/22 --> consulted ID again. - 11/24: bp is soft in AM, give 1L NS, then 100 cc/h NS - 11/24 EEG: this study is suggestive of mild diffuse encephalopathy, nonspecific etiology. No seizures or epileptiform discharges were seen throughout the recording. - 11/24: WBC 16.9  and Platelet 368, neutropenia and thrombocytopenia resolved - 11/25:  Flow cytometry revealed 1% myeloblasts in the setting of pancytopenia and phenotypic aberrancies of neutrophils and monocytes.There was a CD5-, CD10- clonal B cell population, non-specific phenotype, 2% of leukocytes, < 5000/uL. per Dr. Mike Gip, "Unclear if myeloblasts related to recovering marrow with GCSF stimulation or high grademyelodysplasia or leukemia. -11/25: -no fever --> d/ced meropenem by Dr. Theodoro Grist 11/25   CBC Latest Ref Rng & Units 04/15/2019 04/14/2019 04/13/2019  WBC 4.0 - 10.5 K/uL 37.7(H) 16.9(H) 3.6(L)  Hemoglobin 13.0 - 17.0 g/dL 8.2(L) 8.5(L) 8.6(L)  Hematocrit 39.0 - 52.0 % 25.7(L) 26.4(L) 26.4(L)  Platelets 150 - 400 K/uL 502(H) 368 192    Subjective: Has bilateral leg and foot edema, lymphedema which are slight improved, has mild leg tenterness. No CP, AP, fever or chills.  No nausea, vomiting, diarrhea or abdominal pain. Pt is intermittently confused, this AM he is oriented to place and knows his own name.   Assessment & Plan:   Principal Problem:   Neutropenia with fever (HCC) Active Problems:   Rheumatoid arthritis (HCC)   Seizure (HCC)   Pressure injury of skin   Other pancytopenia (HCC)   Folic acid deficiency   Cellulitis of lower extremity   AKI (acute kidney injury) ([place])   Pancytopenia (HCC)   Acute metabolic encephalopathy   Oral mucositis  Neutropenia with fever:  Dr. Delaine Lame of infectious disease was consulted.  Started the patient with vancomycin and meropenem 11/20. Spiked fever at 22:11 on 11/22 --> consulted ID again. Pt has oeral mucositis. WBC 37.7 (pt received one dose of Tbo-Filgrastim (GRANIX)  injection 300 mcg 11/20). No fever.  -highly appreciate Dr. Gwenevere Ghazi recommendation. -Vancomycin and meropenem -->dc/d vancomycin per Dr. Ramon Dredge 11/24. -no fever --> d/ced meropenem by Dr. Theodoro Grist 11/25 -Follow-up blood culture and urine culture -->so far  no grow  Acute metabolic encephalopathy: Patient has been intermittently confused, etiology is not clear.  Differential diagnoses include delirium.  CT head was done to make sure pt does not have intracranial bleeding given thrombocytopenia, which is a limited study, but did not showed acute intracranial abnormalities.  Neurology, Dr. Doy Mince was consulted, highly appreciated Dr. Doy Mince consultation.   -Follow-up recommendations as follows:  1.  B1 level, CMET, serum ammonia --> ammonia level normal.  2. EEG --> 11/24, this study is suggestive of mild diffuse encephalopathy, nonspecific etiology. No seizures or epileptiform discharges were seen throughout the recording.  3. MRI of the brain with and without contrast -->negagive for acute issues   4. Consider decrease in Neurontin to 200mg  TID if EEG unremarkable -->aleady changed  -Frequent neuro checks  Cellulitis of lower extremity superimposed on venous stasis changes associated lymphedema: blood culture no grow so far. Pt developed severe pancytopenia. ID was consulted 11/19. BNP 27. -Doxycycline stopped 11/19 -f/u Bx  New pancytopenia with neutropenia: improved significantly. Unclear etiology. Likely due to mtx toxicity.  Heme-onc was consulted, oncology is on board, highly appreciated. 11/20 CT guided bone marrow aspiration and biopsy was done by IR. Neutropenia and thrombocytopenia resolved 11/24.  -on folic acid -iron study showed Iron/TIBC/Ferritin/ %Sat    Component Value Date/Time   IRON 16 (L) 04/12/2019 0504   TIBC 119 (L) 04/12/2019 0504   FERRITIN 368 (H) 04/12/2019 0504   IRONPCTSAT 14 (L) 04/12/2019 0504   -Tbo-Filgrastim (GRANIX) injection 300 mcg was given by oncology 11/20  CBC Latest Ref Rng & Units 04/15/2019 04/14/2019 04/13/2019  WBC 4.0 - 10.5 K/uL 37.7(H) 16.9(H) 3.6(L)  Hemoglobin 13.0 - 17.0 g/dL 8.2(L) 8.5(L) 8.6(L)  Hematocrit 39.0 - 52.0 % 25.7(L) 26.4(L) 26.4(L)  Platelets 150 - 400 K/uL 502(H)  368 192    Acute kidney injury: resolved.  -f/u by BMP  Rheumatoid arthritis:continueholding immunosuppressants currently, heme-onc as noted concerned about methotrexate toxicity  Neuropathy:  -Continue gabapentin -->decreased dose from 300 4 time per day to 200 mg tid on 11/22  Seizure disorder: no evidence of breakthrough seizures -ContinueLamictal -seizure precaution  Pressure ulcers present on admission. These were bilateral buttock as well as mid sacrum: Wound care was consultedappreciate their recommendations. -Continue with silver Hydrofiber, and foam changing every 3 days and as needed for soilage  DVT prophylaxis: SCD -->sarted sq Heparin Code Status: full Family Communication: called her daughter in-law Disposition Plan: not ready for discharge. Patient remains severely deconditioned/ debilitated. Still need IV antibiotics.    Consultants:  Infectious disease ID Oncology IR  Procedures: 11/20: CT guided bone marrow aspiration and biopsy  Antimicrobials: Anti-infectives (From admission, onward)   Start     Dose/Rate Route Frequency Ordered Stop   04/14/19 2000  vancomycin (VANCOCIN) IVPB 1000 mg/200 mL premix  Status:  Discontinued     1,000 mg 200 mL/hr over 60 Minutes Intravenous Every 12 hours 04/14/19 1303 04/14/19 1318   04/12/19 0000  vancomycin (VANCOCIN) 1,750 mg in sodium chloride 0.9 % 500 mL IVPB  Status:  Discontinued     1,750 mg 250 mL/hr over 120 Minutes Intravenous Every 24 hours 04/11/19 0508 04/14/19 1303   04/11/19 0000  vancomycin (VANCOCIN) 2,000 mg in sodium chloride 0.9 % 500 mL IVPB  2,000 mg 250 mL/hr over 120 Minutes Intravenous  Once 04/10/19 2346 04/11/19 0228   04/11/19 0000  meropenem (MERREM) 1 g in sodium chloride 0.9 % 100 mL IVPB  Status:  Discontinued     1 g 200 mL/hr over 30 Minutes Intravenous Every 8 hours 04/10/19 2346 04/15/19 1238   04/03/19 1230  doxycycline (VIBRA-TABS) tablet 100 mg  Status:   Discontinued     100 mg Oral Every 12 hours 04/03/19 1224 04/09/19 1547   04/02/19 1500  vancomycin (VANCOCIN) 1,250 mg in sodium chloride 0.9 % 250 mL IVPB  Status:  Discontinued     1,250 mg 166.7 mL/hr over 90 Minutes Intravenous Every 48 hours 03/31/19 1902 04/01/19 0844   04/02/19 0800  vancomycin (VANCOCIN) 1,250 mg in sodium chloride 0.9 % 250 mL IVPB  Status:  Discontinued     1,250 mg 166.7 mL/hr over 90 Minutes Intravenous Every 36 hours 04/01/19 0844 04/03/19 1224   04/02/19 0000  minocycline (MINOCIN) 100 MG capsule     100 mg Oral 2 times daily 04/02/19 1254 04/12/19 2359   03/31/19 1830  vancomycin (VANCOCIN) IVPB 1000 mg/200 mL premix     1,000 mg 200 mL/hr over 60 Minutes Intravenous  Once 03/31/19 1819 03/31/19 2209   03/31/19 1445  vancomycin (VANCOCIN) IVPB 1000 mg/200 mL premix     1,000 mg 200 mL/hr over 60 Minutes Intravenous  Once 03/31/19 1436 03/31/19 1643         Objective: Vitals:   04/14/19 1505 04/14/19 2034 04/15/19 0528 04/15/19 1423  BP: 102/61 120/78 106/63 (!) 81/52  Pulse: 82 (!) 101 90 96  Resp:  16 20   Temp:  98.4 F (36.9 C) 98.4 F (36.9 C) 97.8 F (36.6 C)  TempSrc:  Oral Oral   SpO2:  91% 95% 96%  Weight:      Height:        Intake/Output Summary (Last 24 hours) at 04/15/2019 1710 Last data filed at 04/15/2019 [id number] Gross per 24 hour  Intake 327.36 ml  Output 100 ml  Net 227.36 ml   Filed Weights   04/10/19 0500  Weight: 118.2 kg    Examination:  Physical Exam:  General: Not in acute distress HEENT: PERRL, EOMI, no scleral icterus, No JVD or bruit Cardiac: S1/S2, RRR, No murmurs, gallops or rubs Pulm:  No rales, wheezing, rhonchi or rubs. Abd: Soft, nondistended, nontender, no rebound pain, no organomegaly, BS present Ext: has bilateral leg and foot edema and lymphedema. 1+DP/PT pulse bilaterally Musculoskeletal: No joint deformities, erythema, or stiffness, ROM full Skin: No rashes.  Neuro: Pt is confused, knows  his own name, but not oriented x 3. cranial nerves II-XII grossly intact, moves all extremeties normally. Psych: Patient is not psychotic, no suicidal or hemocidal ideation.    Data Reviewed: I have personally reviewed following labs and imaging studies  CBC: Recent Labs  Lab 04/11/19 0842 04/12/19 0504 04/13/19 0412 04/14/19 0435 04/15/19 0429  WBC 1.4* 1.9* 3.6* 16.9* 37.7*  NEUTROABS 0.1* 0.1* 0.8* 6.7 16.1*  HGB 9.1* 8.9* 8.6* 8.5* 8.2*  HCT 28.8* 28.3* 26.4* 26.4* 25.7*  MCV 89.4 89.6 85.7 88.3 87.4  PLT 47* 94* 192 368 [id number]*   Basic Metabolic Panel: Recent Labs  Lab 04/11/19 0842 04/12/19 0504 04/13/19 0412 04/14/19 0435 04/15/19 0429  NA 145 148* 146* 147* 147*  K 3.4* 3.8 3.7 3.3* 3.7  CL 112* 117* 116* 115* 117*  CO2 25 24 23 23 25   GLUCOSE  95 75 88 87 101*  BUN 17 14 16 13 10   CREATININE 0.99 1.09 1.10 0.94 1.00  CALCIUM 8.2* 8.3* 8.1* 7.8* 7.9*  MG  --   --  2.0 1.7  --    GFR: Estimated Creatinine Clearance: 82.1 mL/min (by C-G formula based on SCr of 1 mg/dL). Liver Function Tests: No results for input(s): AST, ALT, ALKPHOS, BILITOT, PROT, ALBUMIN in the last 168 hours. No results for input(s): LIPASE, AMYLASE in the last 168 hours. Recent Labs  Lab 04/12/19 1609  AMMONIA 10   Coagulation Profile: No results for input(s): INR, PROTIME in the last 168 hours. Cardiac Enzymes: No results for input(s): CKTOTAL, CKMB, CKMBINDEX, TROPONINI in the last 168 hours. BNP (last 3 results) No results for input(s): PROBNP in the last 8760 hours. HbA1C: No results for input(s): HGBA1C in the last 72 hours. CBG: No results for input(s): GLUCAP in the last 168 hours. Lipid Profile: No results for input(s): CHOL, HDL, LDLCALC, TRIG, CHOLHDL, LDLDIRECT in the last 72 hours. Thyroid Function Tests: No results for input(s): TSH, T4TOTAL, FREET4, T3FREE, THYROIDAB in the last 72 hours. Anemia Panel: No results for input(s): VITAMINB12, FOLATE, FERRITIN, TIBC,  IRON, RETICCTPCT in the last 72 hours. Sepsis Labs: Recent Labs  Lab 04/13/19 0905 04/13/19 1054  PROCALCITON 0.38  --   LATICACIDVEN 1.6 1.6    Recent Results (from the past 240 hour(s))  SARS CORONAVIRUS 2 (TAT 6-24 HRS) Nasopharyngeal Nasopharyngeal Swab     Status: None   Collection Time: 04/08/19  2:04 PM   Specimen: Nasopharyngeal Swab  Result Value Ref Range Status   SARS Coronavirus 2 NEGATIVE NEGATIVE Final    Comment: (NOTE) SARS-CoV-2 target nucleic acids are NOT DETECTED. The SARS-CoV-2 RNA is generally detectable in upper and lower respiratory specimens during the acute phase of infection. Negative results do not preclude SARS-CoV-2 infection, do not rule out co-infections with other pathogens, and should not be used as the sole basis for treatment or other patient management decisions. Negative results must be combined with clinical observations, patient history, and epidemiological information. The expected result is Negative. Fact Sheet for Patients: SugarRoll.be Fact Sheet for Healthcare Providers: https://www.woods-mathews.com/ This test is not yet approved or cleared by the [place] FDA and  has been authorized for detection and/or diagnosis of SARS-CoV-2 by FDA under an Emergency Use Authorization (EUA). This EUA will remain  in effect (meaning this test can be used) for the duration of the COVID-19 declaration under Section 56 4(b)(1) of the Act, 21 U.S.C. section 360bbb-3(b)(1), unless the authorization is terminated or revoked sooner. Performed at [facility], [place] [address], [place], [place] [address]   CULTURE, BLOOD (ROUTINE X 2) w Reflex to ID Panel     Status: None (Preliminary result)   Collection Time: 04/10/19 11:55 PM   Specimen: BLOOD  Result Value Ref Range Status   Specimen Description BLOOD RIGHT ANTECUBITAL  Final   Special Requests   Final    BOTTLES DRAWN AEROBIC AND ANAEROBIC  Blood Culture adequate volume   Culture   Final    NO GROWTH 4 DAYS Performed at [facility], [address] [place], [place] [address]    Report Status PENDING  Incomplete  CULTURE, BLOOD (ROUTINE X 2) w Reflex to ID Panel     Status: None (Preliminary result)   Collection Time: 04/10/19 11:59 PM   Specimen: BLOOD  Result Value Ref Range Status   Specimen Description BLOOD RIGHT  ARM  Final   Special Requests   Final    BOTTLES DRAWN AEROBIC AND ANAEROBIC Blood Culture adequate volume   Culture   Final    NO GROWTH 4 DAYS Performed at [facility], [address] [place], [place] [address]    Report Status PENDING  Incomplete  Urine Culture     Status: None   Collection Time: 04/11/19  2:00 AM   Specimen: Urine, Random  Result Value Ref Range Status   Specimen Description   Final    URINE, RANDOM Performed at [facility], [address], [place], [place] [address]    Special Requests   Final    NONE Performed at [facility], [address] [place], [place] [address]    Culture   Final    NO GROWTH Performed at [facility], [place] [address], [place], [place] [address]    Report Status 04/12/2019 FINAL  Final     RN Pressure Injury Documentation: Pressure Injury 04/03/19 Sacrum (Active)  04/03/19 1653  Location: Sacrum  Location Orientation:   Staging:   Wound Description (Comments):   Present on Admission: Yes     Pressure Injury Heel Left;Posterior;Right Unstageable - Full thickness tissue loss in which the base of the ulcer is covered by slough (yellow, tan, gray, green or brown) and/or eschar (tan, brown or black) in the wound bed. (Active)     Location: Heel  Location Orientation: Left;Posterior;Right  Staging: Unstageable - Full thickness tissue loss in which the base of the ulcer is covered by slough (yellow, tan, gray, green or brown) and/or eschar (tan, brown or black) in the wound bed.  Wound Description  (Comments):   Present on Admission:      Pressure Injury 04/07/19 Thigh Right;Medial Stage II -  Partial thickness loss of dermis presenting as a shallow open ulcer with a red, pink wound bed without slough. (Active)  04/07/19 1309  Location: Thigh  Location Orientation: Right;Medial  Staging: Stage II -  Partial thickness loss of dermis presenting as a shallow open ulcer with a red, pink wound bed without slough.  Wound Description (Comments):   Present on Admission: No       Radiology Studies: No results found.      Scheduled Meds: . aspirin  81 mg Oral Daily  . feeding supplement (ENSURE ENLIVE)  237 mL Oral TID BM  . folic acid  1 mg Oral Daily  . gabapentin  200 mg Oral TID  . heparin injection (subcutaneous)  5,000 Units Subcutaneous Q8H  . lamoTRIgine  200 mg Oral BID  . lamoTRIgine  25 mg Oral BID  . loratadine  10 mg Oral Daily  . mouth rinse  15 mL Mouth Rinse BID  . metoprolol tartrate  12.5 mg Oral BID  . multivitamin with minerals  1 tablet Oral Daily  . pantoprazole  40 mg Oral Daily  . sodium chloride flush  3 mL Intravenous Q12H  . traZODone  50 mg Oral QHS  . vitamin C  500 mg Oral BID   Continuous Infusions: . sodium chloride 100 mL/hr at 04/15/19 0957     LOS: 14 days    Time spent: 86 min     Ivor Costa, DO [facility] PAGER is on [place]  If 7PM-7AM, please contact night-coverage [email or web address] Password [email or web address] 04/15/2019, 5:10 PM

## 2019-04-15 NOTE — Care Management Important Message (Signed)
Important Message  Patient Details  Name: Jeremiah [name]. MRN: [id number] Date of Birth: [birth year]   Medicare Important Message Given:  Yes     [name] 04/15/2019, 11:12 AM

## 2019-04-15 NOTE — Progress Notes (Signed)
[facility] Hematology/Oncology Progress Note  Date of admission: 03/31/2019  Hospital day:  04/15/2019  Chief Complaint: Jeremiah [name]. is a 78 y.o. male with rheumatoid arthritis and venous stasis/chronic lymphedema who was admitted through the ER with bilateral lower extremity cellultis.  Labs: Results for orders placed or performed during the hospital encounter of 03/31/19 (from the past 48 hour(s))  Lactic acid, plasma     Status: None   Collection Time: 04/13/19  9:05 AM  Result Value Ref Range   Lactic Acid, Venous 1.6 0.5 - 1.9 mmol/L    Comment: Performed at [facility], [address] [place], [place] [address]  Procalcitonin     Status: None   Collection Time: 04/13/19  9:05 AM  Result Value Ref Range   Procalcitonin 0.38 ng/mL    Comment:        Interpretation: PCT (Procalcitonin) <= 0.5 ng/mL: Systemic infection (sepsis) is not likely. Local bacterial infection is possible. (NOTE)       Sepsis PCT Algorithm           Lower Respiratory Tract                                      Infection PCT Algorithm    ----------------------------     ----------------------------         PCT < 0.25 ng/mL                PCT < 0.10 ng/mL         Strongly encourage             Strongly discourage   discontinuation of antibiotics    initiation of antibiotics    ----------------------------     -----------------------------       PCT 0.25 - 0.50 ng/mL            PCT 0.10 - 0.25 ng/mL               OR       >80% decrease in PCT            Discourage initiation of                                            antibiotics      Encourage discontinuation           of antibiotics    ----------------------------     -----------------------------         PCT >= 0.50 ng/mL              PCT 0.26 - 0.50 ng/mL               AND        <80% decrease in PCT             Encourage initiation of                                             antibiotics        Encourage continuation           of antibiotics    ----------------------------     -----------------------------  PCT >= 0.50 ng/mL                  PCT > 0.50 ng/mL               AND         increase in PCT                  Strongly encourage                                      initiation of antibiotics    Strongly encourage escalation           of antibiotics                                     -----------------------------                                           PCT <= 0.25 ng/mL                                                 OR                                        > 80% decrease in PCT                                     Discontinue / Do not initiate                                             antibiotics Performed at [facility], [address] [place], [place] [address]   Lactic acid, plasma     Status: None   Collection Time: 04/13/19 10:54 AM  Result Value Ref Range   Lactic Acid, Venous 1.6 0.5 - 1.9 mmol/L    Comment: Performed at [facility], [address] [place], [place] [address]  CBC with Differential     Status: Abnormal   Collection Time: 04/14/19  4:35 AM  Result Value Ref Range   WBC 16.9 (H) 4.0 - 10.5 K/uL   RBC 2.99 (L) 4.22 - 5.81 MIL/uL   Hemoglobin 8.5 (L) 13.0 - 17.0 g/dL   HCT 26.4 (L) 39.0 - 52.0 %   MCV 88.3 80.0 - 100.0 fL   MCH 28.4 26.0 - 34.0 pg   MCHC 32.2 30.0 - 36.0 g/dL   RDW 18.7 (H) 11.5 - 15.5 %   Platelets 368 150 - 400 K/uL   nRBC 1.5 (H) 0.0 - 0.2 %   Neutrophils Relative % 39 %   Neutro Abs 6.7 1.7 - 7.7 K/uL   Lymphocytes Relative 18 %   Lymphs Abs 3.0 0.7 - 4.0 K/uL   Monocytes Relative 13 %   Monocytes  Absolute 2.2 (H) 0.1 - 1.0 K/uL   Eosinophils Relative 5 %   Eosinophils Absolute 0.9 (H) 0.0 - 0.5 K/uL   Basophils Relative 1 %   Basophils Absolute 0.1 0.0 - 0.1 K/uL   RBC Morphology MIXED RBC POPULATION    Immature Granulocytes 24 %   Abs Immature Granulocytes 4.09 (H) 0.00 - 0.07  K/uL   Polychromasia PRESENT    Giant PLTs PRESENT     Comment: Performed at [facility], [address] [place], [place] [address]  AM - BMET     Status: Abnormal   Collection Time: 04/14/19  4:35 AM  Result Value Ref Range   Sodium 147 (H) 135 - 145 mmol/L   Potassium 3.3 (L) 3.5 - 5.1 mmol/L   Chloride 115 (H) 98 - 111 mmol/L   CO2 23 22 - 32 mmol/L   Glucose, Bld 87 70 - 99 mg/dL   BUN 13 8 - 23 mg/dL   Creatinine, Ser 0.94 0.61 - 1.24 mg/dL   Calcium 7.8 (L) 8.9 - 10.3 mg/dL   GFR calc non Af Amer >60 >60 mL/min   GFR calc Af Amer >60 >60 mL/min   Anion gap 9 5 - 15    Comment: Performed at [facility], [address] [place], [place] [address]  AM - Mg     Status: None   Collection Time: 04/14/19  4:35 AM  Result Value Ref Range   Magnesium 1.7 1.7 - 2.4 mg/dL    Comment: Performed at [facility], [address] [place], [place] [address]  Pathologist smear review     Status: None   Collection Time: 04/14/19  4:35 AM  Result Value Ref Range   Path Review Blood smear is reviewed.     Comment: Leukocytosis with neutrophilia and left shifted maturation. Normochromic, normocytic anemia, with anisopoikilocytosis, including nucleated RBCs, elliptocytes, teardrop cells, and few RBC fragments.  Normal platelet count with moderate variation in platelet size.  Reviewed by Kathi Simpers, M.D. Performed at [facility], [address] [place], [place] [address]    No results found.   CBCs: 03/26/2019: Hematocrit 33.4.  Hemoglobin 10.7.  MCV 89.0.  Platelets 128,000, WBC 3700. 03/31/2019: Hematocrit 30.5.  Hemoglobin 10.0.  MCV 86.4.  Platelets 206,000.  WBC 3000 (ANC 1600). 04/01/2019: Hematocrit 26.0.  Hemoglobin 8.5.  MCV 85.2.  Platelets 165,000.  WBC 2900.  04/04/2019: Hematocrit 27.8.  Hemoglobin 9.2.  MCV 85.3.  Platelets 107,000.  WBC 2200 (ANC1500).  04/05/2019: Hematocrit 27.0.  Hemoglobin 8.9.  MCV 85.2.  Platelets 81,000.   WBC 1000 ([place] 200).       04/06/2019: Hematocrit 27.8.  Hemoglobin 9.0.  MCV 86.1.  Platelets 60,000.  WBC 1700 (ANC 1300).  04/07/2019: Hematocrit 27.6.  Hemoglobin 8.6.  MCV 90.2.  Platelets 55,000.  WBC 2300 (ANC 1400). 04/08/2019: Hematocrit 29.7.  Hemoglobin 9.2.  MCV 90.5.  Platelets 51,000.  WBC 1600 ([place] 700). 04/09/2019: Hematocrit 28.7.  Hemoglobin 9.3.  MCV 87.2.  Platelets 33,000.  WBC 1500 ([place] 400). 04/10/2019: Hematocrit 31.0.  Hemoglobin 9.7.  MCV 90.6.  Platelets 23,000.  WBC 1500 ([place] 200). 04/11/2019: Hematocrit 28.8.  Hemoglobin 9.1.  MCV 89.4.  Platelets 47,000.  WBC 1400 (ANC 100). 04/12/2019: Hematocrit 28.3.  Hemoglobin 8.9.  MCV 89.6.  Platelets 94,000.  WBC 1900 (ANC 100). 04/13/2019: Hematocrit 26.4.  Hemoglobin 8.6.  MCV 85.7.  Platelets 192,000.  WBC 3600 ([place] 800). 04/14/2019: Hematocrit 26.4.  Hemoglobin  8.5.  MCV 88.3.  Platelets 368,000.  WBC 16,900 ([place] 6700).   Assessment:  Dann Buenafe. is a 78 y.o. male with rheumatoid arthritis who was admitted through the ER with bilateral lower extremity cellulitis unresponsive to outpatient management.  Cultures remain negative to date.  He was initially treated in the outpatient department with ciprofloxacin and clindamycin.  On admission he was switched to vancomycin and then doxycycline 04/03/2019.  He was rheumatoid arthritis and receives oral MTX weekly, Enbrel SQ weekly on Fridays, and Plaquenil.  Creatinine was 3.12 on admission and is currently 1.10.  Peripheral smear reveals absolute leukopenia with neutropenia and ANC of 400. RBC are normochromic, normocytic anemia with mild anisocytosis. There is no significant polychromasia, and low reticulocyte count, indicating RBC hypoproliferation. There is no increase in schistocytes, less than 0.5% of RBCs.  There is thrombocytopenia, with unremarkable morphology. Etiology is likely due to decreased marrow production is favored.   He has folic acid deficiency.   Folic acid was 3.4 on [id number].  Per patient's report, he stopped folic acid about 1 month ago.  GCSF (Granix) began on 04/11/2019.  Granix discontinued on 04/14/2019.  Plan: 1.   Pancytopenia  Anemia   Hemoglobin is 8.5.     Retic count was < 0.4% (low) on 04/05/2019 and 0.5 (still low) on 04/08/2019.   No evidence of hemolysis.  Coombs negative.  LDH slightly elevated.   B12 was 606.  Folate was 3.4 (low).  TSH was 3.279.   Folic acid began [id number].   Ferritin 368 with an iron saturation of 14% and a TIBC 119 on 04/12/2019.   Continue to monitor.  Thrombocytopenia   Platelet count 368,000.   Heparin SQ started for DVT prophylaxis.   No evidence of heparin induced thrombocytopenia (HIT)    Heparin induced platelet antibody 0.058 (low).    Serotonion release assay is < 1%.   Called for bone marrow results- not available.    Bone marrow should be available tomorrow.  Leukopenia   WBC 1500 (ANC 200) to 3600 ([place] 800) with initiation of GCSF on 04/11/2019.   WBC 16,900 ([place] 6700) today.   Patient no longer neutropenic.   Discontinue Granix.  Etiology felt due to myelosuppression from methotrexate (MTX) in conjunction with low folate (per patient, he did not take for 1 month).   Patient with improving stomatitis as WBC improves.     MTX level was < 0.02 on 04/07/2019.   Other etiologies include marrow replacement, viral suppression.    Flow cytometry revealed 1% myeloblasts in the setting of pancytopenia and phenotypic aberrancies of neutrophils and monocytes.     There was a CD5-, CD10- clonal B cell population, non-specific phenotype, 2% of leukocytes, < 5000/uL.     Unclear if myeloblasts related to recovering marrow with GCSF stimulation or high grade myelodysplasia or leukemia.     Await bone marrow.    Parvovirus B19 IgG + and IgM - indicative of an old infection.    HIV testing negative.             Bone marrow aspirate and biopsy results should be available tomorrow.      2.   Bilateral lower extremity cellulitis  Appreciate ID consult.  Patient with severe lymphedema/venous edema felt mimicking cellulitis.  Patient in compression bandages. 3.   Fever and neutropenia  Patient developed a fever (101.3 axillary) on evening of 04/10/2019.  Blood cultures negative to date.  Patient was started on vancomycin  and imipenem.  Appreciate ID consult.  Vancomycin discontinued with plan for meropenem discontinuation if afebrile. 4.   Disposition  Patient remains hospitalized.  Anticipate follow-up in the hematology oncology clinic after discharge.   Addendum:  I will be on vacation beginning 04/15/2019 until 04/20/2019.  Dr Grayland Ormond is on call.   Lequita Asal, MD  04/15/2019, 4:19 AM

## 2019-04-15 NOTE — Consult Note (Signed)
[facility] Nurse Consult Note: Patient receiving care in [facility]. Reason for Consult: reassess wound care orders Wound type: NO wounds on heels.  Right heel has dried skin that is peeling only.  Scrotal and sacral issues are resolved.  Orders changed to reflect the improvement of the areas. Thank you for the consult.  Discussed plan of care with the patient and bedside nurse.  [facility] nurse will not follow at this time.  Please re-consult the [place] team if needed.  Val Riles, RN, MSN, CWOCN, CNS-BC, pager [phone]

## 2019-04-15 NOTE — Progress Notes (Signed)
ID Pt doing well No fever No sob No nasuea No vomiting   Patient Vitals for the past 24 hrs:  BP Temp Temp src Pulse Resp SpO2 Weight  04/15/19 0528 106/63 98.4 F (36.9 C) Oral 90 20 95 % -  04/14/19 2034 120/78 98.4 F (36.9 C) Oral (!) 101 16 91 % -  04/14/19 1505 102/61 - - 82 - - -  04/14/19 1340 (!) 89/58 97.7 F (36.5 C) Oral 85 13 98 % -     O/E Awake and alert Oral ulcers are resolving Oral ulcers Chest CTA HS s1s2 ABD soft  CBC Latest Ref Rng & Units 04/15/2019 04/14/2019 04/13/2019  WBC 4.0 - 10.5 K/uL 37.7(H) 16.9(H) 3.6(L)  Hemoglobin 13.0 - 17.0 g/dL 8.2(L) 8.5(L) 8.6(L)  Hematocrit 39.0 - 52.0 % 25.7(L) 26.4(L) 26.4(L)  Platelets 150 - 400 K/uL 502(H) 368 192    CMP Latest Ref Rng & Units 04/15/2019 04/14/2019 04/13/2019  Glucose 70 - 99 mg/dL 101(H) 87 88  BUN 8 - 23 mg/dL '10 13 16  '$ Creatinine 0.61 - 1.24 mg/dL 1.00 0.94 1.10  Sodium 135 - 145 mmol/L 147(H) 147(H) 146(H)  Potassium 3.5 - 5.1 mmol/L 3.7 3.3(L) 3.7  Chloride 98 - 111 mmol/L 117(H) 115(H) 116(H)  CO2 22 - 32 mmol/L '25 23 23  '$ Calcium 8.9 - 10.3 mg/dL 7.9(L) 7.8(L) 8.1(L)  Total Protein 6.5 - 8.1 g/dL - - -  Total Bilirubin 0.3 - 1.2 mg/dL - - -  Alkaline Phos 38 - 126 U/L - - -  AST 15 - 41 U/L - - -  ALT 0 - 44 U/L - - -   Impression/recommendation  Febrile neutropenia has resolved Discontinue antibiotic meropenem.  Pancytopenia secondary to methotrexate toxicity.  Bone marrow biopsy result pending  Neutropenia has resolved    leukocytosis now secondary to G-CSF which has been stopped.   Oral mucositis is almost resolved  Lymphedema legs may be can cellulitis.  Currently the legs are less swollen with compression wraps .  He will need continuous compression wraps with Coban.  Rheumatoid arthritis  Low folate being supplemented  Methotrexate toxicity is resolving  ID will sign off.  Please call if needed.

## 2019-04-16 LAB — CBC
HCT: 26.3 % — ABNORMAL LOW (ref 39.0–52.0)
Hemoglobin: 8.5 g/dL — ABNORMAL LOW (ref 13.0–17.0)
MCH: 27.6 pg (ref 26.0–34.0)
MCHC: 32.3 g/dL (ref 30.0–36.0)
MCV: 85.4 fL (ref 80.0–100.0)
Platelets: 547 10*3/uL — ABNORMAL HIGH (ref 150–400)
RBC: 3.08 MIL/uL — ABNORMAL LOW (ref 4.22–5.81)
RDW: 19.1 % — ABNORMAL HIGH (ref 11.5–15.5)
WBC: 39.2 10*3/uL — ABNORMAL HIGH (ref 4.0–10.5)
nRBC: 1.2 % — ABNORMAL HIGH (ref 0.0–0.2)

## 2019-04-16 LAB — CULTURE, BLOOD (ROUTINE X 2)
Culture: NO GROWTH
Culture: NO GROWTH
Special Requests: ADEQUATE
Special Requests: ADEQUATE

## 2019-04-16 LAB — BASIC METABOLIC PANEL
Anion gap: 6 (ref 5–15)
BUN: 8 mg/dL (ref 8–23)
CO2: 24 mmol/L (ref 22–32)
Calcium: 8 mg/dL — ABNORMAL LOW (ref 8.9–10.3)
Chloride: 112 mmol/L — ABNORMAL HIGH (ref 98–111)
Creatinine, Ser: 0.89 mg/dL (ref 0.61–1.24)
GFR calc Af Amer: >60 mL/min (ref >60)
GFR calc non Af Amer: >60 mL/min (ref >60)
Glucose, Bld: 89 mg/dL (ref 70–99)
Potassium: 3.6 mmol/L (ref 3.5–5.1)
Sodium: 142 mmol/L (ref 135–145)

## 2019-04-16 MED ORDER — LORAZEPAM 2 MG/ML IJ SOLN: 1.0000 mg | Freq: Two times a day (BID) | INTRAMUSCULAR | Status: DC | PRN

## 2019-04-16 NOTE — Progress Notes (Signed)
  PROGRESS NOTE    Jeremiah B Enneking Jr.  MRN:[id number] DOB: 01/10/1941 DOA: 03/31/2019 PCP: Babaoff, Marcus, MD    Brief Narrative:  Jeremiah B Schermerhorn Jr. is a 78 y.o. male with Past medical history of rheumatoid arthritis on immunosuppressive therapy, seizures, HTN, chronic lymphedema. Patient presented with complaints of worsening leg infection. Patient was seen on 03/26/2019 in the ED at [facility] for cellulitis of the leg.  Patient was discharged on oral Cipro and clindamycin. Patient was taking all his medication up to this many days and was compliant with it. There was an area of infection and redness which was marked. Since last 2 days patient's pain was worsening and that area of redness across the marked line and therefore patient decided to come to the hospital. No nausea no vomiting no fever no chills no chest pain abdominal pain no diarrhea no constipation. Patient remains compliant with all his medication. Continue to take all his immunosuppressive medication throughout this infection course.  ED Course: Presents with above complaint.  And referred for admission for failure for outpatient therapy.  At his baseline ambulates with assistance independent for most of his ADL;  manages his medication on his own.  Interim History: - 11/20 CT guided bone marrow aspiration and biopsy - 11/20: due to fever with neutropenia, Dr. Ravisankar of ID started patient with vancomycin and meropenem, pending Bx and Ux - 11/20: Tbo-Filgrastim (GRANIX) injection 300 mcg was given - 11/20: neuro was consulted for AMS. CT-head is a limited study, but did not showed acute intracranial abnormalities. - 11/23: spiked fever at 22:11 on 11/22 --> consulted ID again. - 11/24: bp is soft in AM, give 1L NS, then 100 cc/h NS - 11/24 EEG: this study is suggestive of mild diffuse encephalopathy, nonspecific etiology. No seizures or epileptiform discharges were seen throughout the recording. - 11/24: WBC 16.9  and Platelet 368, neutropenia and thrombocytopenia resolved - 11/25:  Flow cytometry revealed 1% myeloblasts in the setting of pancytopenia and phenotypic aberrancies of neutrophils and monocytes.There was a CD5-, CD10- clonal B cell population, non-specific phenotype, 2% of leukocytes, < 5000/uL. per Dr. Corcoran, "Unclear if myeloblasts related to recovering marrow with GCSF stimulation or high grademyelodysplasia or leukemia. -11/25: -no fever --> d/ced meropenem by Dr. Ravisharkar 11/25. All antibiotics are discontinued -11/26: no fever    CBC Latest Ref Rng & Units 04/16/2019 04/15/2019 04/14/2019  WBC 4.0 - 10.5 K/uL 39.2(H) 37.7(H) 16.9(H)  Hemoglobin 13.0 - 17.0 g/dL 8.5(L) 8.2(L) 8.5(L)  Hematocrit 39.0 - 52.0 % 26.3(L) 25.7(L) 26.4(L)  Platelets 150 - 400 K/uL 547(H) 502(H) 368    Subjective: Has bilateral leg and foot edema, lymphedema, which are improving, has mild leg tenterness. No CP, AP, fever or chills.  No nausea, vomiting, diarrhea or abdominal pain. Pt is intermittently confused, this AM he is oriented to place and knows his own name.  Assessment & Plan:   Principal Problem:   Neutropenia with fever (HCC) Active Problems:   Rheumatoid arthritis (HCC)   Seizure (HCC)   Pressure injury of skin   Other pancytopenia (HCC)   Folic acid deficiency   Cellulitis of lower extremity   AKI (acute kidney injury) (HCC)   Pancytopenia (HCC)   Acute metabolic encephalopathy   Oral mucositis  Neutropenia with fever: resolved. Dr. Ravishankar of infectious disease was consulted.  Started the patient with vancomycin and meropenem 11/20. Spiked fever at 22:11 on 11/22 --> consulted ID again. Pt has oeral mucositis. WBC 39.2 (pt   received one dose of Tbo-Filgrastim (GRANIX) injection 300 mcg 11/20). No fever today.  -highly appreciate Dr. Gwenevere Ghazi recommendation. -Vancomycin and meropenem -->dc/d vancomycin per Dr. Ramon Dredge 11/24. -no fever on 11/25 --> d/ced meropenem by  Dr. Theodoro Grist 11/25 -Follow-up blood culture and urine culture -->so far no grow  Acute metabolic encephalopathy: Patient has been intermittently confused, etiology is not clear.  Differential diagnoses include delirium.  CT head was done to make sure pt does not have intracranial bleeding given thrombocytopenia, which is a limited study, but did not showed acute intracranial abnormalities.  Neurology, Dr. Doy Mince was consulted, highly appreciated Dr. Doy Mince consultation.   -Follow-up recommendations as follows:  1.  B1 level, CMET, serum ammonia --> ammonia level normal.  2. EEG --> 11/24, this study is suggestive of mild diffuse encephalopathy, nonspecific etiology. No seizures or epileptiform discharges were seen throughout the recording.  3. MRI of the brain with and without contrast -->negagive for acute issues   4. Consider decrease in Neurontin to 262m TID if EEG unremarkable -->aleady changed  -Frequent neuro checks  Cellulitis of lower extremity superimposed on venous stasis changes associated lymphedema: blood culture no grow so far. Pt developed severe pancytopenia. ID was consulted 11/19. BNP 27. -Doxycycline stopped 11/19 -f/u Bx  New pancytopenia with neutropenia: resolved. Unclear etiology. Likely due to mtx toxicity.  Heme-onc was consulted, oncology is on board, highly appreciated. 11/20 CT guided bone marrow aspiration and biopsy was done by IR. Neutropenia and thrombocytopenia resolved 11/24. -on folic acid -pending bone marrow biopsy results -iron study showed Iron/TIBC/Ferritin/ %Sat    Component Value Date/Time   IRON 16 (L) 04/12/2019 0504   TIBC 119 (L) 04/12/2019 0504   FERRITIN 368 (H) 04/12/2019 0504   IRONPCTSAT 14 (L) 04/12/2019 0504   -Tbo-Filgrastim (GRANIX) injection 300 mcg was given by oncology 11/20  CBC Latest Ref Rng & Units 04/16/2019 04/15/2019 04/14/2019  WBC 4.0 - 10.5 K/uL 39.2(H) 37.7(H) 16.9(H)  Hemoglobin 13.0 - 17.0 g/dL 8.5(L)  8.2(L) 8.5(L)  Hematocrit 39.0 - 52.0 % 26.3(L) 25.7(L) 26.4(L)  Platelets 150 - 400 K/uL 547(H) 502(H) 368    Acute kidney injury: resolved.  -f/u by BMP  Rheumatoid arthritis:continueholding immunosuppressants currently, heme-onc as noted concerned about methotrexate toxicity  Neuropathy:  -Continue gabapentin -->decreased dose from 300 4 time per day to 200 mg tid on 11/22  Seizure disorder: no evidence of breakthrough seizures -ContinueLamictal -seizure precaution  Pressure ulcers present on admission. These were bilateral buttock as well as mid sacrum: Wound care was consultedappreciate their recommendations. -Continue with silver Hydrofiber, and foam changing every 3 days and as needed for soilage  DVT prophylaxis:  sq Heparin Code Status: full Family Communication: called her daughter in-law Disposition Plan: not ready for discharge. Patient remains severely deconditioned/ debilitated. Pending bone marrow biopsy for next plan.  Consultants:  Infectious disease ID Oncology IR  Procedures: 11/20: CT guided bone marrow aspiration and biopsy  Antimicrobials: Anti-infectives (From admission, onward)   Start     Dose/Rate Route Frequency Ordered Stop   04/14/19 2000  vancomycin (VANCOCIN) IVPB 1000 mg/200 mL premix  Status:  Discontinued     1,000 mg 200 mL/hr over 60 Minutes Intravenous Every 12 hours 04/14/19 1303 04/14/19 1318   04/12/19 0000  vancomycin (VANCOCIN) 1,750 mg in sodium chloride 0.9 % 500 mL IVPB  Status:  Discontinued     1,750 mg 250 mL/hr over 120 Minutes Intravenous Every 24 hours 04/11/19 0508 04/14/19 1303   04/11/19 0000  vancomycin (VANCOCIN) 2,000 mg in sodium chloride 0.9 % 500 mL IVPB     2,000 mg 250 mL/hr over 120 Minutes Intravenous  Once 04/10/19 2346 04/11/19 0228   04/11/19 0000  meropenem (MERREM) 1 g in sodium chloride 0.9 % 100 mL IVPB  Status:  Discontinued     1 g 200 mL/hr over 30 Minutes Intravenous Every 8 hours  04/10/19 2346 04/15/19 1238   04/03/19 1230  doxycycline (VIBRA-TABS) tablet 100 mg  Status:  Discontinued     100 mg Oral Every 12 hours 04/03/19 1224 04/09/19 1547   04/02/19 1500  vancomycin (VANCOCIN) 1,250 mg in sodium chloride 0.9 % 250 mL IVPB  Status:  Discontinued     1,250 mg 166.7 mL/hr over 90 Minutes Intravenous Every 48 hours 03/31/19 1902 04/01/19 0844   04/02/19 0800  vancomycin (VANCOCIN) 1,250 mg in sodium chloride 0.9 % 250 mL IVPB  Status:  Discontinued     1,250 mg 166.7 mL/hr over 90 Minutes Intravenous Every 36 hours 04/01/19 0844 04/03/19 1224   04/02/19 0000  minocycline (MINOCIN) 100 MG capsule     100 mg Oral 2 times daily 04/02/19 1254 04/12/19 2359   03/31/19 1830  vancomycin (VANCOCIN) IVPB 1000 mg/200 mL premix     1,000 mg 200 mL/hr over 60 Minutes Intravenous  Once 03/31/19 1819 03/31/19 2209   03/31/19 1445  vancomycin (VANCOCIN) IVPB 1000 mg/200 mL premix     1,000 mg 200 mL/hr over 60 Minutes Intravenous  Once 03/31/19 1436 03/31/19 1643         Objective: Vitals:   04/16/19 1229 04/16/19 1231 04/16/19 1400 04/16/19 1609  BP: (!) 73/37 (!) 97/56 103/62   Pulse: 68 81    Resp: 18 18    Temp: 97.8 F (36.6 C)  98.3 F (36.8 C)   TempSrc: Oral  Oral   SpO2: 95% 98%  97%  Weight:      Height:        Intake/Output Summary (Last 24 hours) at 04/16/2019 1735 Last data filed at 04/16/2019 1502 Gross per 24 hour  Intake 1131.47 ml  Output -  Net 1131.47 ml   Filed Weights   04/10/19 0500  Weight: 118.2 kg    Examination:  Physical Exam:  General: Not in acute distress HEENT: PERRL, EOMI, no scleral icterus, No JVD or bruit Cardiac: S1/S2, RRR, No murmurs, gallops or rubs Pulm:  No rales, wheezing, rhonchi or rubs. Abd: Soft, nondistended, nontender, no rebound pain, no organomegaly, BS present Ext: has bilateral leg and foot edema and lymphedema. 1+DP/PT pulse bilaterally Musculoskeletal: No joint deformities, erythema, or  stiffness, ROM full Skin: No rashes.  Neuro: Pt is confused, knows his own name, but not oriented x 3. cranial nerves II-XII grossly intact, moves all extremeties normally. Psych: Patient is not psychotic, no suicidal or hemocidal ideation.    Data Reviewed: I have personally reviewed following labs and imaging studies  CBC: Recent Labs  Lab 04/11/19 0842 04/12/19 0504 04/13/19 0412 04/14/19 0435 04/15/19 0429 04/16/19 0540  WBC 1.4* 1.9* 3.6* 16.9* 37.7* 39.2*  NEUTROABS 0.1* 0.1* 0.8* 6.7 16.1*  --   HGB 9.1* 8.9* 8.6* 8.5* 8.2* 8.5*  HCT 28.8* 28.3* 26.4* 26.4* 25.7* 26.3*  MCV 89.4 89.6 85.7 88.3 87.4 85.4  PLT 47* 94* 192 368 502* 381*   Basic Metabolic Panel: Recent Labs  Lab 04/12/19 0504 04/13/19 0412 04/14/19 0435 04/15/19 0429 04/16/19 0540  NA 148* 146* 147* 147* 142  K 3.8 3.7 3.3* 3.7 3.6  CL 117* 116* 115* 117* 112*  CO2 _0 GLUCOSE 75 88 87 101* 89  BUN _1 CREATININE 1.09 1.10 0.94 1.00 0.89  CALCIUM 8.3* 8.1* 7.8* 7.9* 8.0*  MG  --  2.0 1.7  --   --    GFR: Estimated Creatinine Clearance: 92.2 mL/min (by C-G formula based on SCr of 0.89 mg/dL). Liver Function Tests: No results for input(s): AST, ALT, ALKPHOS, BILITOT, PROT, ALBUMIN in the last 168 hours. No results for input(s): LIPASE, AMYLASE in the last 168 hours. Recent Labs  Lab 04/12/19 1609  AMMONIA 10   Coagulation Profile: No results for input(s): INR, PROTIME in the last 168 hours. Cardiac Enzymes: No results for input(s): CKTOTAL, CKMB, CKMBINDEX, TROPONINI in the last 168 hours. BNP (last 3 results) No results for input(s): PROBNP in the last 8760 hours. HbA1C: No results for input(s): HGBA1C in the last 72 hours. CBG: Recent Labs  Lab 04/15/19 1716  GLUCAP 91   Lipid Profile: No results for input(s): CHOL, HDL, LDLCALC, TRIG, CHOLHDL, LDLDIRECT in the last 72 hours. Thyroid Function Tests: No results for input(s): TSH, T4TOTAL, FREET4, T3FREE,  THYROIDAB in the last 72 hours. Anemia Panel: No results for input(s): VITAMINB12, FOLATE, FERRITIN, TIBC, IRON, RETICCTPCT in the last 72 hours. Sepsis Labs: Recent Labs  Lab 04/13/19 0905 04/13/19 1054  PROCALCITON 0.38  --   LATICACIDVEN 1.6 1.6    Recent Results (from the past 240 hour(s))  SARS CORONAVIRUS 2 (TAT 6-24 HRS) Nasopharyngeal Nasopharyngeal Swab     Status: None   Collection Time: 04/08/19  2:04 PM   Specimen: Nasopharyngeal Swab  Result Value Ref Range Status   SARS Coronavirus 2 NEGATIVE NEGATIVE Final    Comment: (NOTE) SARS-CoV-2 target nucleic acids are NOT DETECTED. The SARS-CoV-2 RNA is generally detectable in upper and lower respiratory specimens during the acute phase of infection. Negative results do not preclude SARS-CoV-2 infection, do not rule out co-infections with other pathogens, and should not be used as the sole basis for treatment or other patient management decisions. Negative results must be combined with clinical observations, patient history, and epidemiological information. The expected result is Negative. Fact Sheet for Patients: SugarRoll.be Fact Sheet for Healthcare Providers: https://www.woods-mathews.com/ This test is not yet approved or cleared by the [place] FDA and  has been authorized for detection and/or diagnosis of SARS-CoV-2 by FDA under an Emergency Use Authorization (EUA). This EUA will remain  in effect (meaning this test can be used) for the duration of the COVID-19 declaration under Section 56 4(b)(1) of the Act, 21 U.S.C. section 360bbb-3(b)(1), unless the authorization is terminated or revoked sooner. Performed at [facility], [place] [address], [place], [place] [address]   CULTURE, BLOOD (ROUTINE X 2) w Reflex to ID Panel     Status: None   Collection Time: 04/10/19 11:55 PM   Specimen: BLOOD  Result Value Ref Range Status   Specimen Description BLOOD RIGHT  ANTECUBITAL  Final   Special Requests   Final    BOTTLES DRAWN AEROBIC AND ANAEROBIC Blood Culture adequate volume   Culture   Final    NO GROWTH 5 DAYS Performed at [facility], [address] [place], [place] [address]    Report Status 04/16/2019 FINAL  Final  CULTURE, BLOOD (ROUTINE X 2) w Reflex to ID Panel     Status: None   Collection  Time: 04/10/19 11:59 PM   Specimen: BLOOD  Result Value Ref Range Status   Specimen Description BLOOD RIGHT ARM  Final   Special Requests   Final    BOTTLES DRAWN AEROBIC AND ANAEROBIC Blood Culture adequate volume   Culture   Final    NO GROWTH 5 DAYS Performed at [facility], [address], [place], [place] [address]    Report Status 04/16/2019 FINAL  Final  Urine Culture     Status: None   Collection Time: 04/11/19  2:00 AM   Specimen: Urine, Random  Result Value Ref Range Status   Specimen Description   Final    URINE, RANDOM Performed at [facility], [address], [place], [place] [address]    Special Requests   Final    NONE Performed at [facility], [address] [place], [place] [address]    Culture   Final    NO GROWTH Performed at [facility], [place] [address], [place], [place] [address]    Report Status 04/12/2019 FINAL  Final     RN Pressure Injury Documentation: Pressure Injury Heel Left;Posterior;Right Unstageable - Full thickness tissue loss in which the base of the ulcer is covered by slough (yellow, tan, gray, green or brown) and/or eschar (tan, brown or black) in the wound bed. (Active)     Location: Heel  Location Orientation: Left;Posterior;Right  Staging: Unstageable - Full thickness tissue loss in which the base of the ulcer is covered by slough (yellow, tan, gray, green or brown) and/or eschar (tan, brown or black) in the wound bed.  Wound Description (Comments):   Present on Admission:        Radiology Studies: No results found.      Scheduled  Meds: . aspirin  81 mg Oral Daily  . feeding supplement (ENSURE ENLIVE)  237 mL Oral TID BM  . folic acid  1 mg Oral Daily  . gabapentin  200 mg Oral TID  . heparin injection (subcutaneous)  5,000 Units Subcutaneous Q8H  . lamoTRIgine  200 mg Oral BID  . lamoTRIgine  25 mg Oral BID  . loratadine  10 mg Oral Daily  . mouth rinse  15 mL Mouth Rinse BID  . metoprolol tartrate  12.5 mg Oral BID  . multivitamin with minerals  1 tablet Oral Daily  . pantoprazole  40 mg Oral Daily  . sodium chloride flush  3 mL Intravenous Q12H  . traZODone  50 mg Oral QHS  . vitamin C  500 mg Oral BID   Continuous Infusions: . sodium chloride 100 mL/hr at 04/16/19 1502     LOS: 15 days    Time spent: 30 min     Ivor Costa, DO [facility] PAGER is on AMION  If 7PM-7AM, please contact night-coverage [email or web address] Password [facility] 04/16/2019, 5:35 PM

## 2019-04-16 NOTE — Progress Notes (Signed)
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  INTERVAL HISTORY: Patient appears confused, but offers no specific complaints.  REVIEW OF SYSTEMS:   Review of Systems  Constitutional: Positive for malaise/fatigue. Negative for fever and weight loss.  Respiratory: Negative.  Negative for cough, hemoptysis and shortness of breath.   Cardiovascular: Negative.  Negative for chest pain and leg swelling.  Gastrointestinal: Negative.  Negative for abdominal pain.  Genitourinary: Negative.  Negative for dysuria.  Musculoskeletal: Negative.  Negative for joint pain.  Skin: Negative.  Negative for rash.  Neurological: Positive for weakness. Negative for dizziness, focal weakness and headaches.  Psychiatric/Behavioral: Negative.  The patient is not nervous/anxious.     As per HPI. Otherwise, a complete review of systems is negative.  PAST MEDICAL HISTORY: Past Medical History:  Diagnosis Date   Allergy    Arthritis    Hypertension    Rheumatoid arthritis ([place])    Seizures ([place])    Sepsis ([place]) 05/23/2015    PAST SURGICAL HISTORY: Past Surgical History:  Procedure Laterality Date   back surgery five times  04/2016   CARPAL TUNNEL RELEASE     HERNIA REPAIR     JOINT REPLACEMENT     knee replacement right     right hip replacement     rotator cuff surgery     SPINE SURGERY      FAMILY HISTORY: Family History  Problem Relation Age of Onset   Hypertension Other    Hypertension Mother    Dementia Mother    Dementia Father    Prostate cancer Neg Hx    Bladder Cancer Neg Hx    Kidney cancer Neg Hx     ADVANCED DIRECTIVES (Y/N):  _0 @  HEALTH MAINTENANCE: Social History   Tobacco Use    Smoking status: Former Smoker    Packs/day: 1.00    Years: 20.00    Pack years: 20.00    Types: Cigarettes   Smokeless tobacco: Never Used  Substance Use Topics   Alcohol use: No   Drug use: Not on file     Colonoscopy:  PAP:  Bone density:  Lipid panel:  Allergies  Allergen Reactions   Cefoperazone Anaphylaxis   Penicillins Anaphylaxis and Other (See Comments)    Has patient had a PCN reaction causing immediate rash, facial/tongue/throat swelling, SOB or lightheadedness with hypotension: Yes Has patient had a PCN reaction causing severe rash involving mucus membranes or skin necrosis: No Has patient had a PCN reaction that required hospitalization No Has patient had a PCN reaction occurring within the last 10 years: No If all of the above answers are "NO", then may proceed with Cephalosporin use.   Latex Rash    Current Facility-Administered Medications  Medication Dose Route Frequency Provider Last Rate Last Dose   0.45 % sodium chloride infusion   Intravenous Continuous Ivor Costa, MD   Stopped at 04/16/19 0046   acetaminophen (TYLENOL) tablet 650 mg  650 mg Oral Q6H PRN Lavina Hamman, MD   650 mg at 04/12/19 2235   Or   acetaminophen (TYLENOL) suppository 650 mg  650 mg Rectal Q6H PRN Lavina Hamman, MD       aspirin chewable tablet 81 mg  81 mg Oral Daily Lavina Hamman, MD  81 mg at 04/16/19 0839   feeding supplement (ENSURE ENLIVE) (ENSURE ENLIVE) liquid 237 mL  237 mL Oral TID BM Ivor Costa, MD   237 mL at 04/16/19 0840   fentaNYL (SUBLIMAZE) injection   Intravenous PRN Aletta Edouard, MD   25 mcg at 56/38/93 7342   folic acid (FOLVITE) tablet 1 mg  1 mg Oral Daily Nolon Stalls C, MD   1 mg at 04/16/19 0840   gabapentin (NEURONTIN) capsule 200 mg  200 mg Oral TID Ivor Costa, MD   200 mg at 04/16/19 0838   heparin injection 5,000 Units  5,000 Units Subcutaneous Cleophas Dunker, MD   5,000 Units at 04/16/19 0505   lamoTRIgine (LAMICTAL) tablet  200 mg  200 mg Oral BID Lavina Hamman, MD   200 mg at 04/16/19 0840   lamoTRIgine (LAMICTAL) tablet 25 mg  25 mg Oral BID Lavina Hamman, MD   25 mg at 04/16/19 8768   lip balm (BLISTEX) ointment   Topical PRN Marcell Anger, MD       loratadine (CLARITIN) tablet 10 mg  10 mg Oral Daily Ivor Costa, MD   10 mg at 04/16/19 0840   LORazepam (ATIVAN) injection 1 mg  1 mg Intramuscular Q12H PRN Ivor Costa, MD       magic mouthwash w/lidocaine  5 mL Oral TID PRN Ivor Costa, MD       MEDLINE mouth rinse  15 mL Mouth Rinse BID Lavina Hamman, MD   15 mL at 04/16/19 0840   metoprolol tartrate (LOPRESSOR) tablet 12.5 mg  12.5 mg Oral BID Marcell Anger, MD   12.5 mg at 04/16/19 0841   midazolam (VERSED) injection   Intravenous PRN Aletta Edouard, MD   1 mg at 04/10/19 1157   multivitamin with minerals tablet 1 tablet  1 tablet Oral Daily Ivor Costa, MD   1 tablet at 04/16/19 0839   ondansetron (ZOFRAN) tablet 4 mg  4 mg Oral Q6H PRN Lavina Hamman, MD       Or   ondansetron [facility]) injection 4 mg  4 mg Intravenous Q6H PRN Lavina Hamman, MD       oxyCODONE (Oxy IR/ROXICODONE) immediate release tablet 5 mg  5 mg Oral Q6H PRN Lavina Hamman, MD   5 mg at 04/12/19 2234   pantoprazole (PROTONIX) EC tablet 40 mg  40 mg Oral Daily Lavina Hamman, MD   40 mg at 04/16/19 0838   polyethylene glycol (MIRALAX / GLYCOLAX) packet 17 g  17 g Oral Daily PRN Lavina Hamman, MD       sodium chloride flush (NS) 0.9 % injection 3 mL  3 mL Intravenous Q12H Lavina Hamman, MD   3 mL at 04/15/19 2102   traZODone (DESYREL) tablet 50 mg  50 mg Oral QHS Lavina Hamman, MD   50 mg at 04/15/19 2055   vitamin C (ASCORBIC ACID) tablet 500 mg  500 mg Oral BID Ivor Costa, MD   500 mg at 04/16/19 0839    OBJECTIVE: Vitals:   04/15/19 2036 04/16/19 0618  BP: 118/63 125/69  Pulse: 96 84  Resp: 16 18  Temp: 98 F (36.7 C) 98.6 F (37 C)  SpO2: 96% 96%     Body mass index is 35.34  kg/m.    ECOG FS:2 - Symptomatic, <50% confined to bed  General: Well-developed, well-nourished, no acute distress. Eyes: Pink conjunctiva, anicteric sclera. HEENT: Normocephalic, moist mucous  membranes. Lungs: Clear to auscultation bilaterally. Heart: Regular rate and rhythm. No rubs, murmurs, or gallops. Abdomen: Soft, nontender, nondistended. No organomegaly noted, normoactive bowel sounds. Musculoskeletal: No edema, cyanosis, or clubbing. Neuro: Alert, mildly confused. Cranial nerves grossly intact. Skin: No rashes or petechiae noted. Psych: Normal affect.   LAB RESULTS:  Lab Results  Component Value Date   NA 142 04/16/2019   K 3.6 04/16/2019   CL 112 (H) 04/16/2019   CO2 24 04/16/2019   GLUCOSE 89 04/16/2019   BUN 8 04/16/2019   CREATININE 0.89 04/16/2019   CALCIUM 8.0 (L) 04/16/2019   PROT 5.4 (L) 04/01/2019   ALBUMIN 2.8 (L) 04/01/2019   AST 37 04/01/2019   ALT 29 04/01/2019   ALKPHOS 66 04/01/2019   BILITOT 0.7 04/01/2019   GFRNONAA >60 04/16/2019   GFRAA >60 04/16/2019    Lab Results  Component Value Date   WBC 39.2 (H) 04/16/2019   NEUTROABS 16.1 (H) 04/15/2019   HGB 8.5 (L) 04/16/2019   HCT 26.3 (L) 04/16/2019   MCV 85.4 04/16/2019   PLT 547 (H) 04/16/2019     STUDIES: Ct Head Wo Contrast  Result Date: 04/11/2019 CLINICAL DATA:  Encephalopathy EXAM: CT HEAD WITHOUT CONTRAST TECHNIQUE: Contiguous axial images were obtained from the base of the skull through the vertex without intravenous contrast. COMPARISON:  05/23/2015 FINDINGS: Technical note: Examination is significantly degraded by patient motion artifact. Patient was unable to follow instructions while in CT. Examination was repeated a total of 3 times with persistent artifact on all series which degrades image quality. Brain: No obvious large territory acute infarction or area of intracranial hemorrhage identified on these limited images. Mild prominence of the ventricles suggests mild cerebral  atrophy, this is stable from prior. Vascular: Limited. Skull: No obvious skull fracture or bone lesion. Sinuses/Orbits: Grossly intact. Other: None. IMPRESSION: 1. Markedly limited exam secondary to patient motion artifact related to patient condition. 2. No obvious acute findings identified within the limitations of this exam. A repeat study when patient is able to cooperate is recommended. Electronically Signed   By: Davina Poke M.D.   On: 04/11/2019 17:50   Mr Jeri Cos GD Contrast  Result Date: 04/12/2019 CLINICAL DATA:  Encephalopathy. History of hypertension and rheumatoid arthritis. EXAM: MRI HEAD WITHOUT AND WITH CONTRAST TECHNIQUE: Multiplanar, multiecho pulse sequences of the brain and surrounding structures were obtained without and with intravenous contrast. CONTRAST:  110m GADAVIST GADOBUTROL 1 MMOL/ML IV SOLN COMPARISON:  None. FINDINGS: BRAIN: There is no acute infarct, acute hemorrhage or extra-axial collection. Early confluent hyperintense T2-weighted signal of the periventricular and deep white matter, most commonly due to chronic ischemic microangiopathy. There is generalized atrophy without lobar predilection. The midline structures are normal. There is no abnormal contrast enhancement. VASCULAR: The major intracranial arterial and venous sinus flow voids are normal. Susceptibility-sensitive sequences show no chronic microhemorrhage or superficial siderosis. SKULL AND UPPER CERVICAL SPINE: Calvarial bone marrow signal is normal. There is no skull base mass. The visualized upper cervical spine and soft tissues are normal. SINUSES/ORBITS: Left-greater-than-right mastoid fluid. No paranasal sinus fluid levels or advanced mucosal thickening. The orbits are normal. IMPRESSION: 1. No acute intracranial abnormality. 2. Generalized atrophy and chronic ischemic microangiopathy. Electronically Signed   By: KUlyses JarredM.D.   On: 04/12/2019 19:56   [place]al  Result Date: 04/01/2019 CLINICAL  DATA:  Acute kidney injury EXAM: RENAL / URINARY TRACT ULTRASOUND COMPLETE COMPARISON:  None. FINDINGS: Right Kidney: Renal measurements: 11.1 x 4.3 x  4.5 cm = volume: 110 mL . Echogenicity within normal limits. No mass or hydronephrosis visualized. Left Kidney: Renal measurements: 11.3 x 5.6 x 4.7 cm = volume: 155 mL. Echogenicity within normal limits. There is a thin walled anechoic simple cyst at the mid to inferior pole the left kidney measuring 2.1 x 1.6 x 2.2 cm. No solid mass or hydronephrosis visualized. Bladder: Decompressed with Foley catheter in place. Other: None. IMPRESSION: Negative for obstructive uropathy. Electronically Signed   By: Davina Poke M.D.   On: 04/01/2019 10:11   Dg Chest Port 1 View  Result Date: 04/11/2019 CLINICAL DATA:  Fever. EXAM: PORTABLE CHEST 1 VIEW COMPARISON:  Radiograph 02/20/2018 FINDINGS: Lung volumes are low.The cardiomediastinal contours are normal. Mild streaky left lung base atelectasis. Pulmonary vasculature is normal. No consolidation, pleural effusion, or pneumothorax. No acute osseous abnormalities are seen. Chronic changes about the shoulders. Remote left rib fracture. IMPRESSION: Low lung volumes with mild left basilar atelectasis. No consolidation to suggest pneumonia. Electronically Signed   By: Keith Rake M.D.   On: 04/11/2019 00:44   Ct Bone Marrow Biopsy & Aspiration  Result Date: 04/10/2019 CLINICAL DATA:  Pancytopenia and need for bone marrow biopsy. EXAM: CT GUIDED BONE MARROW ASPIRATION AND BIOPSY ANESTHESIA/SEDATION: Versed 1.0 mg IV, Fentanyl 50 mcg IV Total Moderate Sedation Time:   18 minutes. The patient's level of consciousness and physiologic status were continuously monitored during the procedure by Radiology nursing. PROCEDURE: The procedure risks, benefits, and alternatives were explained to the patient's son. Questions regarding the procedure were encouraged and answered. The patient's son understands and consents to the  procedure. A time out was performed prior to initiating the procedure. The right gluteal region was prepped with chlorhexidine. Sterile gown and sterile gloves were used for the procedure. Local anesthesia was provided with 1% Lidocaine. Under CT guidance, an 11 gauge On Control bone cutting needle was advanced from a posterior approach into the right iliac bone. Needle positioning was confirmed with CT. Initial non heparinized and heparinized aspirate samples were obtained of bone marrow. Core biopsy was performed via the On Control drill needle. COMPLICATIONS: None FINDINGS: Inspection of initial aspirate did reveal visible particles. Intact core biopsy sample was obtained. IMPRESSION: CT guided bone marrow biopsy of right posterior iliac bone with both aspirate and core samples obtained. Electronically Signed   By: Aletta Edouard M.D.   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  PLAN:    1.  Pancytopenia: Possibly secondary to methotrexate toxicity.  Bone marrow biopsy completed on April 10, 2019 is pending at time of dictation.  White blood cell count and platelet count have recovered, patient remains anemic with a hemoglobin of 8.2. 2.  Anemia: Hemoglobin has trended down to 8.2.  Continue folic acid supplementation.  The remainder of her laboratory work was either negative or within normal limits. 3.  Leukocytosis: Secondary to Granix which has been discontinued.  Monitor.  Awaiting bone marrow biopsy. 4.  Bilateral lower extremity cellulitis: Appreciate ID input.  Antibiotics have been discontinued. 5.  Fevers: Resolved. 6.  Disposition: Patient will require follow-up with Dr. Mike Gip upon discharge.   Lloyd Huger, MD   04/16/2019 11:41 AM

## 2019-04-16 NOTE — Progress Notes (Signed)
Called Rufina Falco regarding patient pulling out his IV due to confusion on several occasions. Provider said it was okay to leave out. Patient does have IM ativan prn if needed. Will continue to monitor.  Christene Slates

## 2019-04-17 LAB — BASIC METABOLIC PANEL
Anion gap: 8 (ref 5–15)
BUN: 7 mg/dL — ABNORMAL LOW (ref 8–23)
CO2: 25 mmol/L (ref 22–32)
Calcium: 8.2 mg/dL — ABNORMAL LOW (ref 8.9–10.3)
Chloride: 106 mmol/L (ref 98–111)
Creatinine, Ser: 0.76 mg/dL (ref 0.61–1.24)
GFR calc Af Amer: >60 mL/min (ref >60)
GFR calc non Af Amer: >60 mL/min (ref >60)
Glucose, Bld: 94 mg/dL (ref 70–99)
Potassium: 4 mmol/L (ref 3.5–5.1)
Sodium: 139 mmol/L (ref 135–145)

## 2019-04-17 LAB — CBC
HCT: 26.6 % — ABNORMAL LOW (ref 39.0–52.0)
Hemoglobin: 8.7 g/dL — ABNORMAL LOW (ref 13.0–17.0)
MCH: 28.1 pg (ref 26.0–34.0)
MCHC: 32.7 g/dL (ref 30.0–36.0)
MCV: 85.8 fL (ref 80.0–100.0)
Platelets: 594 10*3/uL — ABNORMAL HIGH (ref 150–400)
RBC: 3.1 MIL/uL — ABNORMAL LOW (ref 4.22–5.81)
RDW: 19.5 % — ABNORMAL HIGH (ref 11.5–15.5)
WBC: 27 10*3/uL — ABNORMAL HIGH (ref 4.0–10.5)
nRBC: 1.1 % — ABNORMAL HIGH (ref 0.0–0.2)

## 2019-04-17 NOTE — Progress Notes (Signed)
Physical Therapy Treatment Patient Details Name: Jeremiah [name]. MRN: [id number] DOB: Oct 17, 1940 Today's Date: 04/17/2019    History of Present Illness  78 y.o. male with Past medical history of rheumatoid arthritis on immunosuppressive therapy, seizures, HTN, chronic lymphedema.  Went to [facility] last week with cellulitis and got antibiotics, however LE swelling had continued along with increased difficulty with mobility and strength.    PT Comments    Pt presented with deficits in strength, transfers, mobility, gait, balance, and activity tolerance.  Pt with increased confusion this session and found with safety mittens donned.  Pt participated with below therex but required extensive verbal and tactile cue to do so.  Pt will benefit from PT services in a SNF setting upon discharge to safely address above deficits for decreased caregiver assistance and eventual return to PLOF.     Follow Up Recommendations  SNF     Equipment Recommendations  None recommended by PT    Recommendations for Other Services       Precautions / Restrictions Precautions Precautions: Fall Restrictions Weight Bearing Restrictions: No    Mobility  Bed Mobility Overal bed mobility: Needs Assistance Bed Mobility: Rolling Rolling: +2 for physical assistance;Mod assist         General bed mobility comments: Rolling left/right with +2 mod A and max verbal and tactile cues for sequencing  Transfers                 General transfer comment: Unable/unsafe to attempt  Ambulation/Gait                 Stairs             Wheelchair Mobility    Modified Rankin (Stroke Patients Only)       Balance                                            Cognition Arousal/Alertness: Lethargic Behavior During Therapy: Flat affect Overall Cognitive Status: Impaired/Different from baseline                                 General Comments: Pt more confused  this session with increased commands required      Exercises Total Joint Exercises Ankle Circles/Pumps: Strengthening;Both;5 reps;10 reps;Other (comment)(manual resistance provided at tolerated by pt) Quad Sets: Strengthening;Both;5 reps;10 reps Short Arc Quad: Strengthening;Both;5 reps;10 reps Heel Slides: AAROM;Both;10 reps;5 reps Hip ABduction/ADduction: AAROM;Both;10 reps;5 reps Straight Leg Raises: AAROM;Both;10 reps;5 reps General Exercises - Upper Extremity Shoulder Flexion: AAROM;Both;5 reps;10 reps Shoulder ABduction: AAROM;Both;5 reps;10 reps Elbow Flexion: Strengthening;Both;5 reps;10 reps Elbow Extension: Strengthening;Both;10 reps;15 reps    General Comments        Pertinent Vitals/Pain Pain Assessment: No/denies pain    Home Living                      Prior Function            PT Goals (current goals can now be found in the care plan section) Progress towards PT goals: Not progressing toward goals - comment(Pt limited by confusion this session)    Frequency    Min 2X/week      PT Plan Current plan remains appropriate    Co-evaluation  AM-PAC PT "6 Clicks" Mobility   Outcome Measure  Help needed turning from your back to your side while in a flat bed without using bedrails?: Total Help needed moving from lying on your back to sitting on the side of a flat bed without using bedrails?: Total Help needed moving to and from a bed to a chair (including a wheelchair)?: Total Help needed standing up from a chair using your arms (e.g., wheelchair or bedside chair)?: Total Help needed to walk in hospital room?: Total Help needed climbing 3-5 steps with a railing? : Total 6 Click Score: 6    End of Session   Activity Tolerance: Patient tolerated treatment well Patient left: in bed;with call bell/phone within reach;with family/visitor present Nurse Communication: Mobility status PT Visit Diagnosis: Muscle weakness  (generalized) (M62.81);Unsteadiness on feet (R26.81)     Time: [id number] PT Time Calculation (min) (ACUTE ONLY): 25 min  Charges:  $Therapeutic Exercise: 23-37 mins                     D. Scott Nadina Fomby PT, DPT 04/17/19, 3:11 PM

## 2019-04-17 NOTE — Care Management Important Message (Signed)
Important Message  Patient Details  Name: Jeremiah [name]. MRN: [id number] Date of Birth: 1940/06/16   Medicare Important Message Given:  Yes     [name] 04/17/2019, 12:21 PM

## 2019-04-17 NOTE — Progress Notes (Signed)
PROGRESS NOTE  Jeremiah [name]. [id number] DOB: 1940/10/01 DOA: 03/31/2019 PCP: Derinda Late, MD  Hematologist: Dr. Mike Gip  Brief History     A & P  Pancytopenia, thought secondary to methotrexate toxicity.  Status post bone marrow biopsy November 20, result pending.  WBC and platelet counts have recovered. --Appreciate hematology evaluation and recommendations.  Await final recommendations in regard to treatment.  Anemia. --Continue folic acid supplementation per hematology.  Leukocytosis secondary to Granix. --Await bone marrow biopsy.  Rheumatoid arthritis on immunosuppressive therapy  Seizure disorder --Appears stable.  Continue Lamictal.  Neuropathy --Continue gabapentin.  Chronic lymphedema Pressure ulcers present on admission buttock and sacrum   Resolved Hospital Problem list    Neutropenia with fever, resolved, followed by infectious disease. --No further evaluation suggested.  Acute metabolic encephalopathy with intermittent confusion, differential includes delirium.  CT head was limited but showed no acute abnormalities.  Seen by neurology.  MRI brain was unremarkable.  EEG showed no evidence of seizure.  --Appears resolved at this point.  Acute kidney injury  Cellulitis bilateral lower extremities. --Appears resolved.  Antibiotics stopped  DVT prophylaxis: heparin Code Status: Full Family Communication: none Disposition Plan: SNF    Murray Hodgkins, MD  [facility] Direct contact: see [email or web address] (further directions at bottom of note if needed) 7PM-7AM contact night coverage as at bottom of note 04/17/2019, 10:22 AM  LOS: 16 days   Significant Hospital Events   .    Consults:  .    Procedures:  .   Significant Diagnostic Tests:  Marland Kitchen    Micro Data:  .    Antimicrobials:  .   Interval History/Subjective  Feels okay today, tolerating diet, no complaints.  Objective   Vitals:  Vitals:   04/16/19 1950 04/17/19 0433   BP: 138/69 108/65  Pulse: 96 83  Resp: 20 20  Temp: 97.8 F (36.6 C) 98.2 F (36.8 C)  SpO2: 93% 99%    Exam:  Constitutional.  Appears calm, comfortable, debilitated. ENT.  Grossly normal hearing. Respiratory.  Clear to auscultation bilaterally.  No wheezes, rales or rhonchi.  Normal respiratory effort. Cardiovascular.  Regular rate and rhythm.  No murmur, rub or gallop. Abdomen.  Soft, nontender. Psychiatric.  Grossly normal mood and affect.  Speech fluent and appropriate.  I have personally reviewed the following:   Today's Data  . BMP unremarkable . WBC better 27, hemoglobin stable at 8.7  Scheduled Meds: . aspirin  81 mg Oral Daily  . feeding supplement (ENSURE ENLIVE)  237 mL Oral TID BM  . folic acid  1 mg Oral Daily  . gabapentin  200 mg Oral TID  . heparin injection (subcutaneous)  5,000 Units Subcutaneous Q8H  . lamoTRIgine  200 mg Oral BID  . lamoTRIgine  25 mg Oral BID  . loratadine  10 mg Oral Daily  . mouth rinse  15 mL Mouth Rinse BID  . metoprolol tartrate  12.5 mg Oral BID  . multivitamin with minerals  1 tablet Oral Daily  . pantoprazole  40 mg Oral Daily  . sodium chloride flush  3 mL Intravenous Q12H  . traZODone  50 mg Oral QHS  . vitamin C  500 mg Oral BID   Continuous Infusions: . sodium chloride 100 mL/hr at 04/17/19 0737    Principal Problem:   Neutropenia with fever (HCC) Active Problems:   Rheumatoid arthritis (HCC)   Seizure (HCC)   Pressure injury of skin   Other pancytopenia (HCC)   Folic  acid deficiency   Cellulitis of lower extremity   AKI (acute kidney injury) ([place])   Pancytopenia ([place])   Acute metabolic encephalopathy   Oral mucositis   LOS: 16 days   How to contact the [facility] Attending or Consulting provider [place] or covering provider during after hours [place], for this patient?  1. Check the care team in [facility] and look for a) attending/consulting [facility] provider listed and b) the [facility] team listed 2. Log into [email or web address]  and use [place]'s universal password to access. If you do not have the password, please contact the hospital operator. 3. Locate the [facility] provider you are looking for under [facility] and page to a number that you can be directly reached. 4. If you still have difficulty reaching the provider, please page the [facility] (Director on Call) for the Hospitalists listed on amion for assistance.

## 2019-04-18 LAB — VITAMIN B1: Vitamin B1 (Thiamine): 74.2 nmol/L (ref 66.5–200.0)

## 2019-04-18 MED ORDER — FUROSEMIDE 10 MG/ML IJ SOLN
20.0000 mg | Freq: Every day | INTRAMUSCULAR | Status: AC
  Administered 2019-04-18: 20 mg via INTRAVENOUS
  Filled 2019-04-18: qty 4

## 2019-04-18 NOTE — Progress Notes (Signed)
PROGRESS NOTE  Jeremiah [name]. NGE:[id number] DOB: 02-Aug-1940 DOA: 03/31/2019 PCP: Derinda Late, MD  Hematologist: Dr. Mike Gip  Brief History   78 year old man PMH including chronic lymphedema, rheumatoid arthritis, seizure disorder, presented with worsening leg infection.  Treated in [place] ED for cellulitis 11/5, discharged on Cipro and clindamycin.  Admitted for bilateral lower extremity cellulitis, failed outpatient therapy, acute kidney injury.  Condition rapidly improved and discharge was planned, however he developed pancytopenia and was seen by oncology.  He developed delirium during the neutropenic fever and was treated empirically.  He was seen by neurology for delirium.  Work-up was unremarkable.  A & P  Pancytopenia, thought secondary to methotrexate toxicity.  Status post bone marrow biopsy November 20, result pending.  WBC and platelet counts have recovered. --Platelets have recovered.  WBC responded to treatment.  Hemoglobin stable. --Await bone marrow biopsy results and final hematology recommendations.  Anemia. --Appears stable.  Continue folic acid supplementation per hematology.  Leukocytosis secondary to Granix. --Stable.  Await bone marrow biopsy.  Rheumatoid arthritis on immunosuppressive therapy --Medications on hold  Seizure disorder --Appears stable.  Continue Lamictal.  Neuropathy --Continue gabapentin.  Chronic lymphedema --No further evaluation suggested.  Pressure ulcers present on admission buttock and sacrum, stage II sacral wound. -- Barrier cream twice daily and PRN soilage.  Contact with dermatherapy underpads and linens will wick moisture and improve skin microclimate to promote healing.   Resolved Hospital Problem list    Neutropenia with fever, resolved, followed by infectious disease. --No further evaluation suggested.  Acute metabolic encephalopathy with intermittent confusion, differential includes delirium, superimposed on  cognitive decline in the setting of dementia.  CT head was limited but showed no acute abnormalities.  Seen by neurology.  MRI brain was unremarkable.  EEG showed no evidence of seizure.  --Appears resolved at this point.  Acute kidney injury  Cellulitis bilateral lower extremities.   DVT prophylaxis: heparin Code Status: Full Family Communication: none Disposition Plan: SNF    Murray Hodgkins, MD  [facility] Direct contact: see [email or web address] (further directions at bottom of note if needed) 7PM-7AM contact night coverage as at bottom of note 04/18/2019, 2:01 PM  LOS: 17 days   Significant Hospital Events    11/10 admitted  11/12 discharge planned but had difficulty walking   11/14 developed pancytopenia  11/15 hematology consultation  11/18 ID consultation  11/20 developed fever 101.2, treated for febrile neutropenia with empiric antibiotics  11/21 treated with Neupogen for severe neutropenia  11/22 neurology consulted for altered mental status   Consults:   Hematology  ID  Neurology   Procedures:   11/20 CT-guided bone marrow aspiration and biopsy  Significant Diagnostic Tests:   11/24 EEG mild diffuse encephalopathy, nonspecific.  No seizures  11/22 MRI brain no acute intracranial abnormality  11/21 CT head Limited exam.  No gross abnormalities noted.  11/11 renal ultrasound negative for obstruction   Micro Data:      Antimicrobials:     Interval History/Subjective  Feels okay today, no complaints, no new issues.  Objective   Vitals:  Vitals:   04/18/19 0527 04/18/19 1245  BP: 120/77 108/67  Pulse: 88 91  Resp:  18  Temp: (!) 97.4 F (36.3 C) 98.1 F (36.7 C)  SpO2:  96%    Exam:  Constitutional.  Appears calm, comfortable. Respiratory.  Clear to auscultation bilaterally.  No wheezes, rales or rhonchi.  Normal respiratory effort. Cardiovascular.  Regular rate and rhythm.  No murmur, rub or  gallop.  2-3+ bilateral lower  extremity edema. Abdomen.  Soft. Psychiatric.  Grossly normal mood and affect.  Speech fluent and appropriate.  I have personally reviewed the following:   Today's Data   No new data  Scheduled Meds:  aspirin  81 mg Oral Daily   feeding supplement (ENSURE ENLIVE)  237 mL Oral TID BM   folic acid  1 mg Oral Daily   furosemide  20 mg Intravenous Daily   gabapentin  200 mg Oral TID   heparin injection (subcutaneous)  5,000 Units Subcutaneous Q8H   lamoTRIgine  200 mg Oral BID   lamoTRIgine  25 mg Oral BID   loratadine  10 mg Oral Daily   mouth rinse  15 mL Mouth Rinse BID   metoprolol tartrate  12.5 mg Oral BID   multivitamin with minerals  1 tablet Oral Daily   pantoprazole  40 mg Oral Daily   sodium chloride flush  3 mL Intravenous Q12H   traZODone  50 mg Oral QHS   vitamin C  500 mg Oral BID   Continuous Infusions:   Principal Problem:   Neutropenia with fever (HCC) Active Problems:   Rheumatoid arthritis (HCC)   Seizure (HCC)   Pressure injury of skin   Other pancytopenia (HCC)   Folic acid deficiency   Cellulitis of lower extremity   AKI (acute kidney injury) ([place])   Pancytopenia ([place])   Acute metabolic encephalopathy   Oral mucositis   LOS: 17 days   How to contact the [facility] Attending or Consulting provider 7A - 7P or covering provider during after hours 7P -7A, for this patient?  1. Check the care team in [facility] and look for a) attending/consulting TRH provider listed and b) the [facility] team listed 2. Log into [email or web address] and use [place]'s universal password to access. If you do not have the password, please contact the hospital operator. 3. Locate the [facility] provider you are looking for under [facility] and page to a number that you can be directly reached. 4. If you still have difficulty reaching the provider, please page the [facility] (Director on Call) for the Hospitalists listed on amion for assistance.

## 2019-04-19 LAB — CBC
HCT: 25.3 % — ABNORMAL LOW (ref 39.0–52.0)
Hemoglobin: 8.4 g/dL — ABNORMAL LOW (ref 13.0–17.0)
MCH: 27.6 pg (ref 26.0–34.0)
MCHC: 33.2 g/dL (ref 30.0–36.0)
MCV: 83.2 fL (ref 80.0–100.0)
Platelets: 583 10*3/uL — ABNORMAL HIGH (ref 150–400)
RBC: 3.04 MIL/uL — ABNORMAL LOW (ref 4.22–5.81)
RDW: 19.9 % — ABNORMAL HIGH (ref 11.5–15.5)
WBC: 21.5 10*3/uL — ABNORMAL HIGH (ref 4.0–10.5)
nRBC: 1.3 % — ABNORMAL HIGH (ref 0.0–0.2)

## 2019-04-19 LAB — BASIC METABOLIC PANEL
Anion gap: 10 (ref 5–15)
BUN: 8 mg/dL (ref 8–23)
CO2: 23 mmol/L (ref 22–32)
Calcium: 8.4 mg/dL — ABNORMAL LOW (ref 8.9–10.3)
Chloride: 105 mmol/L (ref 98–111)
Creatinine, Ser: 0.92 mg/dL (ref 0.61–1.24)
GFR calc Af Amer: >60 mL/min (ref >60)
GFR calc non Af Amer: >60 mL/min (ref >60)
Glucose, Bld: 100 mg/dL — ABNORMAL HIGH (ref 70–99)
Potassium: 3.7 mmol/L (ref 3.5–5.1)
Sodium: 138 mmol/L (ref 135–145)

## 2019-04-19 NOTE — Progress Notes (Signed)
Patient does not have IV, Dr. Sarajane Jews made aware and is ok with him not having an IV.

## 2019-04-19 NOTE — Progress Notes (Signed)
Notified Dr. Sarajane Jews patients BP was 105/52 and he ordered to hold metoprolol.

## 2019-04-19 NOTE — Progress Notes (Addendum)
PROGRESS NOTE  Jeremiah [name]. ZOX:[id number] DOB: 1940-07-16 DOA: 03/31/2019 PCP: Derinda Late, MD  Hematologist: Dr. Mike Gip  Brief History   78 year old man PMH including chronic lymphedema, rheumatoid arthritis, seizure disorder, presented with worsening leg infection.  Treated in [place] ED for cellulitis 11/5, discharged on Cipro and clindamycin.  Admitted for bilateral lower extremity cellulitis, failed outpatient therapy, acute kidney injury.  Condition rapidly improved and discharge was planned, however he developed pancytopenia and was seen by oncology.  He developed delirium during the neutropenic fever and was treated empirically.  He was seen by neurology for delirium.  Work-up was unremarkable.  A & P  Pancytopenia, thought secondary to methotrexate toxicity.  Status post bone marrow biopsy November 20, result pending.  WBC and platelet counts have recovered. --Hemoglobin and platelets remain stable.  WBC are trending down.   --Continue to await bone marrow biopsy results and final hematology recommendations.  Anemia. --Remains stable.  Continue folic acid supplementation per hematology.  Leukocytosis secondary to Granix. --Trending down.  Await bone marrow biopsy.  Acute metabolic encephalopathy with intermittent confusion, most likely delirium, superimposed on cognitive decline in the setting of dementia.  CT head was limited but showed no acute abnormalities.  Seen by neurology.  MRI brain was unremarkable.  EEG showed no evidence of seizure.  Continue supportive care.  Rheumatoid arthritis on immunosuppressive therapy --Medications on hold  Seizure disorder --No recurrent seizures noted.  Continue Lamictal.  Neuropathy --Continue gabapentin.  Chronic lymphedema --No further evaluation suggested.  Pressure ulcers present on admission buttock and sacrum, stage II sacral wound. -- Barrier cream twice daily and PRN soilage.  Contact with dermatherapy underpads  and linens will wick moisture and improve skin microclimate to promote healing.   Resolved Hospital Problem list    Neutropenia with fever, resolved, followed by infectious disease. --No further evaluation suggested.  Acute kidney injury  Cellulitis bilateral lower extremities.   DVT prophylaxis: heparin Code Status: Full Family Communication: Discussed with wife at bedside yesterday afternoon.  We reviewed current issues and discussed delirium. Disposition Plan: SNF    Murray Hodgkins, MD  [facility] Direct contact: see [email or web address] (further directions at bottom of note if needed) 7PM-7AM contact night coverage as at bottom of note 04/19/2019, 2:20 PM  LOS: 18 days   Significant Hospital Events   . 11/10 admitted . 11/12 discharge planned but had difficulty walking  . 11/14 developed pancytopenia . 11/15 hematology consultation . 11/18 ID consultation . 11/20 developed fever 101.2, treated for febrile neutropenia with empiric antibiotics . 11/21 treated with Neupogen for severe neutropenia . 11/22 neurology consulted for altered mental status   Consults:  . Hematology . ID . Neurology   Procedures:  . 11/20 CT-guided bone marrow aspiration and biopsy  Significant Diagnostic Tests:  . 11/24 EEG mild diffuse encephalopathy, nonspecific.  No seizures . 11/22 MRI brain no acute intracranial abnormality . 11/21 CT head Limited exam.  No gross abnormalities noted. . 11/11 renal ultrasound negative for obstruction   Micro Data:  .    Antimicrobials:  .   Interval History/Subjective  Seems to feel okay, no complaints reported.  Objective   Vitals:  Vitals:   04/19/19 0910 04/19/19 1223  BP: (!) 105/52 100/61  Pulse: (!) 102 (!) 101  Resp:    Temp:  98.9 F (37.2 C)  SpO2:  93%    Exam:  Constitutional, psychiatric.  Awakens easily.  Overall appears calm, confused.  Able to follow some simple  commands. Respiratory.  Clear to auscultation  bilaterally.  No wheezes, rales or rhonchi.  Normal respiratory effort. Cardiovascular.  Regular rate and rhythm.  No murmur, rub or gallop.  2+ bilateral lower extremity lymphedema noted. Abdomen.  Soft, mildly tender.  I have personally reviewed the following:   Today's Data  . BM x4 . Unremarkable . WBC trending down, 21.5.  Hemoglobin stable at 8.4.  Platelets 583.  Scheduled Meds: . aspirin  81 mg Oral Daily  . feeding supplement (ENSURE ENLIVE)  237 mL Oral TID BM  . folic acid  1 mg Oral Daily  . gabapentin  200 mg Oral TID  . heparin injection (subcutaneous)  5,000 Units Subcutaneous Q8H  . lamoTRIgine  200 mg Oral BID  . lamoTRIgine  25 mg Oral BID  . loratadine  10 mg Oral Daily  . mouth rinse  15 mL Mouth Rinse BID  . metoprolol tartrate  12.5 mg Oral BID  . multivitamin with minerals  1 tablet Oral Daily  . pantoprazole  40 mg Oral Daily  . sodium chloride flush  3 mL Intravenous Q12H  . traZODone  50 mg Oral QHS  . vitamin C  500 mg Oral BID   Continuous Infusions:   Principal Problem:   Neutropenia with fever (HCC) Active Problems:   Rheumatoid arthritis (HCC)   Seizure (HCC)   Pressure injury of skin   Other pancytopenia (HCC)   Folic acid deficiency   Cellulitis of lower extremity   AKI (acute kidney injury) ([place])   Pancytopenia ([place])   Acute metabolic encephalopathy   Oral mucositis   LOS: 18 days   How to contact the [facility] Attending or Consulting provider [place] or covering provider during after hours [place], for this patient?  1. Check the care team in [facility] and look for a) attending/consulting TRH provider listed and b) the [facility] team listed 2. Log into [email or web address] and use [place]'s universal password to access. If you do not have the password, please contact the hospital operator. 3. Locate the [facility] provider you are looking for under [facility] and page to a number that you can be directly reached. 4. If you still have difficulty  reaching the provider, please page the [facility] (Director on Call) for the Hospitalists listed on amion for assistance.

## 2019-04-19 NOTE — Progress Notes (Signed)
Notified Dr. Sarajane Jews patient is pocketing his food. Ordered to make patient NPO sips with meds

## 2019-04-20 ENCOUNTER — Encounter (HOSPITAL_COMMUNITY): Payer: Self-pay | Admitting: Hematology and Oncology

## 2019-04-20 ENCOUNTER — Inpatient Hospital Stay: Payer: Medicare Other

## 2019-04-20 DIAGNOSIS — R1310 Dysphagia, unspecified: Secondary | ICD-10-CM

## 2019-04-20 LAB — GLUCOSE, CAPILLARY: Glucose-Capillary: 84 mg/dL (ref 70–99)

## 2019-04-20 LAB — SURGICAL PATHOLOGY

## 2019-04-20 MED ORDER — METOPROLOL TARTRATE 5 MG/5ML IV SOLN
2.5000 mg | Freq: Four times a day (QID) | INTRAVENOUS | Status: DC
  Administered 2019-04-20 – 2019-04-21 (×5): 2.5 mg via INTRAVENOUS
  Filled 2019-04-20 (×6): qty 5

## 2019-04-20 MED ORDER — FOLIC ACID 5 MG/ML IJ SOLN
1.0000 mg | Freq: Every day | INTRAMUSCULAR | Status: DC
  Administered 2019-04-20 – 2019-04-21 (×2): 1 mg via INTRAVENOUS
  Filled 2019-04-20 (×2): qty 0.2

## 2019-04-20 MED ORDER — CHLORHEXIDINE GLUCONATE 0.12 % MT SOLN
15.0000 mL | Freq: Two times a day (BID) | OROMUCOSAL | Status: DC
  Administered 2019-04-20: 15 mL via OROMUCOSAL
  Filled 2019-04-20 (×2): qty 15

## 2019-04-20 MED ORDER — THIAMINE HCL 100 MG/ML IJ SOLN
100.0000 mg | Freq: Every day | INTRAMUSCULAR | Status: DC
  Administered 2019-04-20 – 2019-04-21 (×2): 100 mg via INTRAVENOUS
  Filled 2019-04-20 (×2): qty 2

## 2019-04-20 MED ORDER — ORAL CARE MOUTH RINSE: 15.0000 mL | Freq: Two times a day (BID) | OROMUCOSAL | Status: DC

## 2019-04-20 MED ORDER — PANTOPRAZOLE SODIUM 40 MG IV SOLR
40.0000 mg | INTRAVENOUS | Status: DC
  Administered 2019-04-20 – 2019-04-21 (×2): 40 mg via INTRAVENOUS
  Filled 2019-04-20 (×2): qty 40

## 2019-04-20 MED ORDER — SODIUM CHLORIDE 0.9 % IV SOLN
100.0000 mg | Freq: Two times a day (BID) | INTRAVENOUS | Status: DC
  Administered 2019-04-20 – 2019-04-24 (×9): 100 mg via INTRAVENOUS
  Filled 2019-04-20 (×12): qty 10

## 2019-04-20 MED ORDER — SODIUM CHLORIDE 0.9 % IV SOLN
INTRAVENOUS | Status: DC
  Administered 2019-04-20 – 2019-04-22 (×3): via INTRAVENOUS

## 2019-04-20 NOTE — Progress Notes (Signed)
Notified Dr. Sarajane Jews that patient vomited last night about an hour after medications were given. Dr. Vonzella Nipple a swallow evaluation for patient. SLP nurse said it is not safe to give anything orally as he is holding it all. Dr. Sarajane Jews changed all meds to IV and ordered cardiac monitor.

## 2019-04-20 NOTE — Progress Notes (Signed)
PT Cancellation Note  Patient Details Name: Jeremiah [name]. MRN: [id number] DOB: [birth year]   Cancelled Treatment:    Reason Eval/Treat Not Completed: Medical issues which prohibited therapy: Per Dr. Sarajane Jews hold PT services this date secondary to pt with increased confusion and resting HR 120 bpm.  Will attempt to see pt at a future date/time as medically appropriate.     Linus Salmons PT, DPT 04/20/19, 10:53 AM

## 2019-04-20 NOTE — Progress Notes (Signed)
Subjective: Patient with progressive decline in cognitive status prior to admission.  Remains altered.    Objective: Current vital signs: BP 121/65 (BP Location: Left Arm)   Pulse (!) 120   Temp 98.6 F (37 C) (Axillary)   Resp 18   Ht 6' (1.829 m)   Wt 117 kg   SpO2 92%   BMI 34.98 kg/m  Vital signs in last 24 hours: Temp:  [98.6 F (37 C)-99.7 F (37.6 C)] 98.6 F (37 C) (11/30 0839) Pulse Rate:  [101-124] 120 (11/30 0839) Resp:  [18-20] 18 (11/30 0420) BP: (100-121)/(55-65) 121/65 (11/30 0839) SpO2:  [92 %-97 %] 92 % (11/30 0839) Weight:  [824 kg] 117 kg (11/30 0451)  Intake/Output from previous day: No intake/output data recorded. Intake/Output this shift: No intake/output data recorded. Nutritional status:  Diet Order            Diet NPO time specified Except for: Sips with Meds  Diet effective now        Diet - low sodium heart healthy              Neurologic Exam: Mental Status: Sleeping when I enter the room but able to be alerted.  Does not speak.  Does not follow commands.  Uncooperative.   Cranial Nerves: II: Blinks to bilateral confrontation III,IV, VI: Extra-ocular motions grossly intact bilaterally V,VII: face symmetric VIII: Unable to test IX,X: Unable to test XI: Unable to test XII: Unable to test Motor: Resists active movement strongly in all extremities  Lab Results: Basic Metabolic Panel: Recent Labs  Lab 04/14/19 0435 04/15/19 0429 04/16/19 0540 04/17/19 0849 04/19/19 0456  NA 147* 147* 142 139 138  K 3.3* 3.7 3.6 4.0 3.7  CL 115* 117* 112* 106 105  CO2 '23 25 24 25 23  '$ GLUCOSE 87 101* 89 94 100*  BUN '13 10 8 '$ 7* 8  CREATININE 0.94 1.00 0.89 0.76 0.92  CALCIUM 7.8* 7.9* 8.0* 8.2* 8.4*  MG 1.7  --   --   --   --     Liver Function Tests: No results for input(s): AST, ALT, ALKPHOS, BILITOT, PROT, ALBUMIN in the last 168 hours. No results for input(s): LIPASE, AMYLASE in the last 168 hours. No results for input(s): AMMONIA  in the last 168 hours.  CBC: Recent Labs  Lab 04/14/19 0435 04/15/19 0429 04/16/19 0540 04/17/19 0849 04/19/19 0456  WBC 16.9* 37.7* 39.2* 27.0* 21.5*  NEUTROABS 6.7 16.1*  --   --   --   HGB 8.5* 8.2* 8.5* 8.7* 8.4*  HCT 26.4* 25.7* 26.3* 26.6* 25.3*  MCV 88.3 87.4 85.4 85.8 83.2  PLT 368 502* 547* 594* 583*    Cardiac Enzymes: No results for input(s): CKTOTAL, CKMB, CKMBINDEX, TROPONINI in the last 168 hours.  Lipid Panel: No results for input(s): CHOL, TRIG, HDL, CHOLHDL, VLDL, LDLCALC in the last 168 hours.  CBG: Recent Labs  Lab 04/15/19 1716  GLUCAP 91    Microbiology: Results for orders placed or performed during the hospital encounter of 03/31/19  Culture, blood (routine x 2)     Status: None   Collection Time: 03/31/19  2:40 PM   Specimen: BLOOD  Result Value Ref Range Status   Specimen Description BLOOD LEFT ANTECUBITAL  Final   Special Requests   Final    BOTTLES DRAWN AEROBIC AND ANAEROBIC Blood Culture adequate volume   Culture   Final    NO GROWTH 11 DAYS Performed at [facility], [place]  [address], [place], [place] [address]    Report Status 04/11/2019 FINAL  Final  Culture, blood (routine x 2)     Status: None   Collection Time: 03/31/19  2:40 PM   Specimen: BLOOD  Result Value Ref Range Status   Specimen Description BLOOD BLOOD LEFT HAND  Final   Special Requests   Final    BOTTLES DRAWN AEROBIC AND ANAEROBIC Blood Culture adequate volume   Culture   Final    NO GROWTH 11 DAYS Performed at [facility], [address] [place], [place] [address]    Report Status 04/11/2019 FINAL  Final  SARS CORONAVIRUS 2 (TAT 6-24 HRS) Nasopharyngeal Nasopharyngeal Swab     Status: None   Collection Time: 03/31/19  2:51 PM   Specimen: Nasopharyngeal Swab  Result Value Ref Range Status   SARS Coronavirus 2 NEGATIVE NEGATIVE Final    Comment: (NOTE) SARS-CoV-2 target nucleic acids are NOT DETECTED. The SARS-CoV-2 RNA is generally  detectable in upper and lower respiratory specimens during the acute phase of infection. Negative results do not preclude SARS-CoV-2 infection, do not rule out co-infections with other pathogens, and should not be used as the sole basis for treatment or other patient management decisions. Negative results must be combined with clinical observations, patient history, and epidemiological information. The expected result is Negative. Fact Sheet for Patients: SugarRoll.be Fact Sheet for Healthcare Providers: https://www.woods-mathews.com/ This test is not yet approved or cleared by the [place] FDA and  has been authorized for detection and/or diagnosis of SARS-CoV-2 by FDA under an Emergency Use Authorization (EUA). This EUA will remain  in effect (meaning this test can be used) for the duration of the COVID-19 declaration under Section 56 4(b)(1) of the Act, 21 U.S.C. section 360bbb-3(b)(1), unless the authorization is terminated or revoked sooner. Performed at [facility], [place] [address], [place], [place] [address]   Culture, blood (x 2)     Status: None   Collection Time: 04/05/19 11:17 AM   Specimen: BLOOD  Result Value Ref Range Status   Specimen Description BLOOD LEFT ANTECUBITAL  Final   Special Requests   Final    BOTTLES DRAWN AEROBIC AND ANAEROBIC Blood Culture adequate volume   Culture   Final    NO GROWTH 5 DAYS Performed at [facility], [address] [place], [place] [address]    Report Status 04/10/2019 FINAL  Final  Culture, blood (x 2)     Status: None   Collection Time: 04/05/19 11:25 AM   Specimen: BLOOD  Result Value Ref Range Status   Specimen Description BLOOD RIGHT ANTECUBITAL  Final   Special Requests   Final    BOTTLES DRAWN AEROBIC AND ANAEROBIC Blood Culture adequate volume   Culture   Final    NO GROWTH 5 DAYS Performed at [facility], [address] [place], [place]  [address]    Report Status 04/10/2019 FINAL  Final  SARS CORONAVIRUS 2 (TAT 6-24 HRS) Nasopharyngeal Nasopharyngeal Swab     Status: None   Collection Time: 04/08/19  2:04 PM   Specimen: Nasopharyngeal Swab  Result Value Ref Range Status   SARS Coronavirus 2 NEGATIVE NEGATIVE Final    Comment: (NOTE) SARS-CoV-2 target nucleic acids are NOT DETECTED. The SARS-CoV-2 RNA is generally detectable in upper and lower respiratory specimens during the acute phase of infection. Negative results do not preclude SARS-CoV-2 infection, do not rule out co-infections with other pathogens, and should not be used  as the sole basis for treatment or other patient management decisions. Negative results must be combined with clinical observations, patient history, and epidemiological information. The expected result is Negative. Fact Sheet for Patients: SugarRoll.be Fact Sheet for Healthcare Providers: https://www.woods-mathews.com/ This test is not yet approved or cleared by the [place] FDA and  has been authorized for detection and/or diagnosis of SARS-CoV-2 by FDA under an Emergency Use Authorization (EUA). This EUA will remain  in effect (meaning this test can be used) for the duration of the COVID-19 declaration under Section 56 4(b)(1) of the Act, 21 U.S.C. section 360bbb-3(b)(1), unless the authorization is terminated or revoked sooner. Performed at [facility], [place] [address] [place], [place] [address]   CULTURE, BLOOD (ROUTINE X 2) w Reflex to ID Panel     Status: None   Collection Time: 04/10/19 11:55 PM   Specimen: BLOOD  Result Value Ref Range Status   Specimen Description BLOOD RIGHT ANTECUBITAL  Final   Special Requests   Final    BOTTLES DRAWN AEROBIC AND ANAEROBIC Blood Culture adequate volume   Culture   Final    NO GROWTH 5 DAYS Performed at [facility], [place] [place], [place] [address]    Report Status  04/16/2019 FINAL  Final  CULTURE, BLOOD (ROUTINE X 2) w Reflex to ID Panel     Status: None   Collection Time: 04/10/19 11:59 PM   Specimen: BLOOD  Result Value Ref Range Status   Specimen Description BLOOD RIGHT ARM  Final   Special Requests   Final    BOTTLES DRAWN AEROBIC AND ANAEROBIC Blood Culture adequate volume   Culture   Final    NO GROWTH 5 DAYS Performed at [facility], [address], [place], [place] [address]    Report Status 04/16/2019 FINAL  Final  Urine Culture     Status: None   Collection Time: 04/11/19  2:00 AM   Specimen: Urine, Random  Result Value Ref Range Status   Specimen Description   Final    URINE, RANDOM Performed at [facility], [address], [place], [place] [address]    Special Requests   Final    NONE Performed at [facility], [address], [place], [place] [address]    Culture   Final    NO GROWTH Performed at [facility], [place] [address], [place], [place] [address]    Report Status 04/12/2019 FINAL  Final    Coagulation Studies: No results for input(s): LABPROT, INR in the last 72 hours.  Imaging: No results found.  Medications:  I have reviewed the patient's current medications. Scheduled: . aspirin  81 mg Oral Daily  . feeding supplement (ENSURE ENLIVE)  237 mL Oral TID BM  . folic acid  1 mg Oral Daily  . gabapentin  200 mg Oral TID  . heparin injection (subcutaneous)  5,000 Units Subcutaneous Q8H  . lamoTRIgine  200 mg Oral BID  . lamoTRIgine  25 mg Oral BID  . loratadine  10 mg Oral Daily  . mouth rinse  15 mL Mouth Rinse BID  . metoprolol tartrate  12.5 mg Oral BID  . multivitamin with minerals  1 tablet Oral Daily  . pantoprazole  40 mg Oral Daily  . sodium chloride flush  3 mL Intravenous Q12H  . traZODone  50 mg Oral QHS  . vitamin C  500 mg Oral BID    Assessment/Plan: 78 y.o.  male with a history of HTN, seizures and RA on immunosuppressant therapy whose family reports  complaints of worsening mental status, present for at least the past year and worsening with recent illness and hospitalization.   Patient with a family history of dementia.  MRI of the brain performed and shows no acute changes.  EEG only significant for slowing.  TSH, B12, ESR, B1 and serum ammonia are unremarkable.  Patient with cognitive decline in setting of dementia and progressive cognitive declines complicated by metabolic encephalopathy.  As lab work improves cognitive function may improve somewhat but will lag behind improvement in lab work and will be complicated by continued hospitalization.    Recommendations: 1. PT/OT 2. Will continue to follow with you.      LOS: 19 days   Alexis Goodell, MD Neurology [phone] 04/20/2019  9:36 AM

## 2019-04-20 NOTE — Care Management Important Message (Signed)
Important Message  Patient Details  Name: Jeremiah [name]. MRN: [id number] Date of Birth: 1940-12-17   Medicare Important Message Given:  Yes     Jeremiah [name] 04/20/2019, 2:01 PM

## 2019-04-20 NOTE — Progress Notes (Signed)
PROGRESS NOTE  [name]. NHA:[id number] DOB: 07-13-1940 DOA: 03/31/2019 PCP: Derinda Late, MD  Hematologist: Dr. Mike Gip  Brief History   78 year old man PMH including chronic lymphedema, rheumatoid arthritis, seizure disorder, presented with worsening leg infection.  Treated in [place] ED for cellulitis 11/5, discharged on Cipro and clindamycin.  Admitted for bilateral lower extremity cellulitis, failed outpatient therapy, acute kidney injury.  Condition rapidly improved and discharge was planned, however he developed pancytopenia and was seen by oncology.  He developed delirium during the neutropenic fever and was treated empirically.  He was seen by neurology for delirium.  Work-up was unremarkable.  A & P  Pancytopenia, status post bone marrow biopsy November 20, results available November 30.  Initially thought secondary to methotrexate toxicity, however bone marrow concerning for myelodysplastic syndrome..  WBC and platelet counts have recovered. --We will apprise hematology of results and request further recommendations.  Suspected myelodysplastic syndrome --Further recommendations per oncology.  Fever.  Etiology unclear.  Chest x-ray no acute disease.  Urinalysis pending. --Monitor.  Obtain blood cultures if recurrent.  Acute metabolic encephalopathy with intermittent confusion, most likely delirium, superimposed on cognitive decline in the setting of dementia.  CT head was limited but showed no acute abnormalities.  Seen by neurology.  MRI brain was unremarkable.  EEG showed no evidence of seizure.  Discussed in detail with neurology.  Trazodone and gabapentin discontinued.  Dysphagia. --Probably secondary to acute metabolic encephalopathy.  Currently must be n.p.o.  Medications converted to IV.  Not able to take any nutrition.  Anemia. --Has remained stable.  Continue folic acid supplementation per hematology.  Leukocytosis secondary to Granix. --Trending down  appropriately.   Rheumatoid arthritis on immunosuppressive therapy --Medications on hold  Seizure disorder --No recurrent seizures noted.  Lamictal stopped given n.p.o. status.  Discussed with neurology, substituted with IV Vimpat.  Neuropathy --Gabapentin discontinued given encephalopathy.  Chronic lymphedema --No further evaluation suggested.  Pressure ulcers present on admission buttock and sacrum, stage II sacral wound. -- Barrier cream twice daily and PRN soilage.  Contact with dermatherapy underpads and linens will wick moisture and improve skin microclimate to promote healing.   Prognosis is guarded, his mental status is declining, his health has been declining even prior to admission.  He is essentially bedbound now and not able to follow commands or take a diet safely.  Bone marrow biopsy suggests myelodysplastic disorder.  Will discuss goals of care with wife this afternoon.  He may not survive this hospitalization.  Unless dramatic improvement occurs, hospice may be appropriate.  Resolved Hospital Problem list    Neutropenia with fever, resolved, followed by infectious disease. --No further evaluation suggested.  Acute kidney injury  Cellulitis bilateral lower extremities.   DVT prophylaxis: heparin Code Status: Full Family Communication: Discussed with wife at bedside yesterday afternoon.  We reviewed current issues and discussed delirium. Disposition Plan: SNF    Murray Hodgkins, MD  [facility] Direct contact: see [email or web address] (further directions at bottom of note if needed) 7PM-7AM contact night coverage as at bottom of note 04/20/2019, 5:01 PM  LOS: 19 days   Significant Hospital Events   . 11/10 admitted . 11/12 discharge planned but had difficulty walking  . 11/14 developed pancytopenia . 11/15 hematology consultation . 11/18 ID consultation . 11/20 developed fever 101.2, treated for febrile neutropenia with empiric antibiotics . 11/21 treated  with Neupogen for severe neutropenia . 11/22 neurology consulted for altered mental status   Consults:  . Hematology . ID .  Neurology   Procedures:  . 11/20 CT-guided bone marrow aspiration and biopsy  Significant Diagnostic Tests:  . 11/24 EEG mild diffuse encephalopathy, nonspecific.  No seizures . 11/22 MRI brain no acute intracranial abnormality . 11/21 CT head Limited exam.  No gross abnormalities noted. . 11/11 renal ultrasound negative for obstruction . 11/20 bone marrow The features in the marrow are worrisome for a myelodysplastic syndrome.  Correlation with cytogenetics and NGS is recommended; an addendum will  be issued.    Micro Data:  .    Antimicrobials:  .   Interval History/Subjective  No complaints but his history is unreliable and he appears encephalopathic.  Objective   Vitals:  Vitals:   04/20/19 1148 04/20/19 1401  BP: (!) 150/76 (!) 143/75  Pulse: (!) 128 (!) 120  Resp: 18   Temp: 100 F (37.8 C)   SpO2: 97%     Exam:  Constitutional.  Appears calm and comfortable.  However he appears ill and to be worsening over the last several days. Psychiatric.  Appears confused, he does not respond appropriately to questions and does not follow commands. Cardiovascular.  Regular rate and rhythm.  No murmur, rub or gallop.  2+ lymphedema noted. Respiratory.  Clear to auscultation bilaterally.  No wheezes, rales or rhonchi.  Normal respiratory effort. Abdomen.  Soft, nontender, nondistended.  I have personally reviewed the following:   Today's Data  . Chest x-ray no acute disease, independently reviewed . No new labs today  Scheduled Meds: . chlorhexidine  15 mL Mouth Rinse BID  . folic acid  1 mg Intravenous Daily  . heparin injection (subcutaneous)  5,000 Units Subcutaneous Q8H  . mouth rinse  15 mL Mouth Rinse BID  . mouth rinse  15 mL Mouth Rinse q12n4p  . metoprolol tartrate  2.5 mg Intravenous Q6H  . pantoprazole (PROTONIX) IV  40 mg  Intravenous Q24H  . sodium chloride flush  3 mL Intravenous Q12H  . thiamine injection  100 mg Intravenous Daily   Continuous Infusions: . sodium chloride 75 mL/hr at 04/20/19 1356  . lacosamide (VIMPAT) IV 100 mg (04/20/19 1551)    Principal Problem:   Neutropenia with fever (HCC) Active Problems:   Rheumatoid arthritis (HCC)   Seizure (HCC)   Pressure injury of skin   Other pancytopenia (HCC)   Folic acid deficiency   Cellulitis of lower extremity   AKI (acute kidney injury) ([place])   Pancytopenia ([place])   Acute metabolic encephalopathy   Oral mucositis   LOS: 19 days   How to contact the [facility] Attending or Consulting provider [place] or covering provider during after hours [place], for this patient?  1. Check the care team in [facility] and look for a) attending/consulting TRH provider listed and b) the [facility] team listed 2. Log into [email or web address] and use [place]'s universal password to access. If you do not have the password, please contact the hospital operator. 3. Locate the [facility] provider you are looking for under [facility] and page to a number that you can be directly reached. 4. If you still have difficulty reaching the provider, please page the [facility] (Director on Call) for the Hospitalists listed on amion for assistance.

## 2019-04-20 NOTE — Evaluation (Addendum)
Clinical/Bedside Swallow Evaluation Patient Details  Name: Jeremiah [name]. MRN: [id number] Date of Birth: 29-May-1940  Today's Date: 04/20/2019 Time: SLP Start Time (ACUTE ONLY): 1324 SLP Stop Time (ACUTE ONLY): 0930 SLP Time Calculation (min) (ACUTE ONLY): 55 min  Past Medical History:  Past Medical History:  Diagnosis Date  . Allergy   . Arthritis   . Hypertension   . Rheumatoid arthritis ([place])   . Seizures ([place])   . Sepsis ([place]) 05/23/2015   Past Surgical History:  Past Surgical History:  Procedure Laterality Date  . back surgery five times  04/2016  . CARPAL TUNNEL RELEASE    . HERNIA REPAIR    . JOINT REPLACEMENT    . knee replacement right    . right hip replacement    . rotator cuff surgery    . SPINE SURGERY     HPI:  Pt is a 78 y.o. male with multiple medical problems including chronic lymphedema, venous stasis, rheumatoid arthritis (Plaquenil, MTX, Enbrel), and multiple spine surgeries.  He was recently seen at [facility] ER (03/26/2019) for he was started on Cipro and clindamycin for bilateral lower extremity cellulitis.  He presented to the ED with complaints of failed outpatient therapy for his lower extremity cellulitis.  Note patient was also found to have pressure ulcers located on his bilateral ankles, buttocks, and mid sacrum.  This admission patient has been followed by oncology s/p bone marrow biopsy for work-up of severe pancytopenia w/ increased delirium, thrombocytopenia, leukopenia which is felt to be due to myelosuppression from methotrexate.  Is also being followed by ID due to oral mucositis and neutropenia.  Continues to have poor oral nutrition intake and is being followed by dietitian.  Palliative medicine team consulted for goals of care.  He was seen by neurology for delirium - "Acute metabolic encephalopathy with intermittent confusion, most likely delirium, superimposed on cognitive decline in the setting of Dementia". NAcute metabolic encephalopathy with  intermittent confusion, most likely delirium, superimposed on cognitive decline in the setting of dementia".  Nursing has noted increased confusion and oral holding/pocketing of foods at meals.    Assessment / Plan / Recommendation Clinical Impression  Pt appears to present w/ Moderate-Severe oral phase dysphagia severely impacted by his declined Cognitive status. Any oral phase deficits such as this presentation could increase risk for pharyngeal phase dysphagia and aspiration thus pulmonary decline. Pt exhibited reduced oral awareness for the task of oral intake and swallowing. When given Max tactile/verbal cues, pt accepted just a few trials of thin liquids orally which he held orally and did not swallow immediately. Reduced OM and lingual movements/awareness for A-P transfer of boluses noted; complete oral holding. Intemittently, some bolus (liquid) leaked posteriorly (suspected) and a pharyngeal swallow was felt, but overall, bolus (liquid) material had to be removed from the mouth w/ no pharyngeal swallow appreciated after ~1 minute x3 occasions. No further trial consistencies assessed d/t risk for oral holding, choking/aspiration. Pt did not respond to Max verbal/tactile stim nor follow instructions which greatly increases risk for aspiration. No unilateral weakness noted at rest; tight labial closure noted. MD consulted and continued NPO status was recommended w/ alternative means for Medications. Recommend frequent oral care for hygiene and stimulation of swallowing; aspiration precautions. ST services will continue to follow up w/ ongoing assessment of swallowing. NSG updated.  SLP Visit Diagnosis: Dysphagia, oropharyngeal phase (R13.12)(Cognitive decline)    Aspiration Risk  Severe aspiration risk;Risk for inadequate nutrition/hydration    Diet Recommendation  NPO  status w/ ongoing frequent oral care for hygiene and stimulation of swallowing; aspiration precautions  Medication Administration:  Via alternative means    Other  Recommendations Recommended Consults: (Dietician and Palliative care following) Oral Care Recommendations: Oral care QID;Staff/trained caregiver to provide oral care(aspiration precautions) Other Recommendations: (TBD)   Follow up Recommendations (TBD)      Frequency and Duration min 3x week  2 weeks       Prognosis Prognosis for Safe Diet Advancement: Guarded Barriers to Reach Goals: Cognitive deficits;Time post onset;Severity of deficits;Behavior      Swallow Study   General Date of Onset: 03/31/19 HPI: Pt is a 78 y.o. male with multiple medical problems including chronic lymphedema, venous stasis, rheumatoid arthritis (Plaquenil, MTX, Enbrel), and multiple spine surgeries.  He was recently seen at [facility] ER (03/26/2019) for he was started on Cipro and clindamycin for bilateral lower extremity cellulitis.  He presented to the ED with complaints of failed outpatient therapy for his lower extremity cellulitis.  Note patient was also found to have pressure ulcers located on his bilateral ankles, buttocks, and mid sacrum.  This admission patient has been followed by oncology s/p bone marrow biopsy for work-up of severe pancytopenia w/ increased delirium, thrombocytopenia, leukopenia which is felt to be due to myelosuppression from methotrexate.  Is also being followed by ID due to oral mucositis and neutropenia.  Continues to have poor oral nutrition intake and is being followed by dietitian.  Palliative medicine team consulted for goals of care.  He was seen by neurology for delirium - "Acute metabolic encephalopathy with intermittent confusion, most likely delirium, superimposed on cognitive decline in the setting of Dementia". NAcute metabolic encephalopathy with intermittent confusion, most likely delirium, superimposed on cognitive decline in the setting of dementia".  Nursing has noted increased confusion and oral holding/pocketing of foods at meals.  Type of  Study: Bedside Swallow Evaluation Previous Swallow Assessment: none noted Diet Prior to this Study: NPO(has been on a regular diet) Temperature Spikes Noted: Yes(wbc 21.5) Respiratory Status: Room air History of Recent Intubation: No Behavior/Cognition: Confused;Requires cueing;Doesn't follow directions(Awake but gave only phonations, eyes opened intermittently) Oral Cavity Assessment: Dry(ulcers - noted possibly x1; NSG reported as well) Oral Care Completed by SLP: Yes(attempted) Oral Cavity - Dentition: Edentulous(may have dentures in cup) Vision: (n/a) Self-Feeding Abilities: Total assist Patient Positioning: Upright in bed(needed full positioning) Baseline Vocal Quality: (phonations only) Volitional Cough: Cognitively unable to elicit Volitional Swallow: Unable to elicit    Oral/Motor/Sensory Function Overall Oral Motor/Sensory Function: (could not assess d/t Cognitive decline)   Ice Chips Ice chips: Impaired Presentation: Spoon(fed; 2 trials) Oral Phase Impairments: Poor awareness of bolus;Reduced lingual movement/coordination(no real OM movements for bolus management) Oral Phase Functional Implications: Oral holding;Prolonged oral transit Pharyngeal Phase Impairments: (pharyngeal swallow not appreciated)   Thin Liquid Thin Liquid: Impaired Presentation: Spoon;Straw(pinched straw; fed - 3 trials via spoon; 2 via straw) Oral Phase Impairments: Reduced lingual movement/coordination;Poor awareness of bolus(min OM movements for bolus management; transfer) Oral Phase Functional Implications: Oral holding;Prolonged oral transit Pharyngeal  Phase Impairments: (pharyngeal swallow appreciated x2) Other Comments: pt then held the remaining trials which had to be removed from mouth via swab, washcloth    Nectar Thick Nectar Thick Liquid: Not tested   Honey Thick Honey Thick Liquid: Not tested   Puree Puree: Not tested   Solid     Solid: Not tested       Orinda Kenner, MS,  CCC-SLP Watson,Katherine 04/20/2019,11:57 AM

## 2019-04-21 ENCOUNTER — Inpatient Hospital Stay: Payer: Medicare Other

## 2019-04-21 DIAGNOSIS — Z515 Encounter for palliative care: Secondary | ICD-10-CM

## 2019-04-21 DIAGNOSIS — Z7189 Other specified counseling: Secondary | ICD-10-CM

## 2019-04-21 DIAGNOSIS — Z66 Do not resuscitate: Secondary | ICD-10-CM

## 2019-04-21 LAB — GLUCOSE, CAPILLARY
Glucose-Capillary: 93 mg/dL (ref 70–99)
Glucose-Capillary: 84 mg/dL (ref 70–99)
Glucose-Capillary: 98 mg/dL (ref 70–99)
Glucose-Capillary: 109 mg/dL — ABNORMAL HIGH (ref 70–99)
Glucose-Capillary: 93 mg/dL (ref 70–99)

## 2019-04-21 MED ORDER — KETOROLAC TROMETHAMINE 15 MG/ML IJ SOLN
15.0000 mg | Freq: Four times a day (QID) | INTRAMUSCULAR | Status: AC | PRN
  Administered 2019-04-21 – 2019-04-25 (×4): 15 mg via INTRAVENOUS
  Filled 2019-04-21 (×5): qty 1

## 2019-04-21 MED ORDER — MORPHINE SULFATE (CONCENTRATE) 10 MG/0.5ML PO SOLN: 5.0000 mg | ORAL | Status: DC | PRN

## 2019-04-21 MED ORDER — BIOTENE DRY MOUTH MT LIQD: 15.0000 mL | OROMUCOSAL | Status: DC | PRN

## 2019-04-21 MED ORDER — NEPRO/CARBSTEADY PO LIQD: 237.0000 mL | Freq: Two times a day (BID) | ORAL | Status: DC

## 2019-04-21 MED ORDER — METOPROLOL TARTRATE 5 MG/5ML IV SOLN
2.5000 mg | Freq: Four times a day (QID) | INTRAVENOUS | Status: DC
  Administered 2019-04-21 – 2019-04-24 (×10): 2.5 mg via INTRAVENOUS
  Filled 2019-04-21 (×10): qty 5

## 2019-04-21 MED ORDER — NEPRO/CARBSTEADY PO LIQD
237.0000 mL | Freq: Two times a day (BID) | ORAL | Status: DC
  Administered 2019-04-22 – 2019-04-26 (×4): 237 mL via ORAL

## 2019-04-21 MED ORDER — FOLIC ACID 5 MG/ML IJ SOLN
1.0000 mg | Freq: Every day | INTRAMUSCULAR | Status: DC
  Administered 2019-04-22 – 2019-04-24 (×3): 1 mg via INTRAVENOUS
  Filled 2019-04-21 (×3): qty 0.2

## 2019-04-21 MED ORDER — POLYVINYL ALCOHOL 1.4 % OP SOLN
1.0000 [drp] | Freq: Four times a day (QID) | OPHTHALMIC | Status: DC | PRN
  Filled 2019-04-21: qty 15

## 2019-04-21 MED ORDER — HEPARIN SODIUM (PORCINE) 5000 UNIT/ML IJ SOLN
5000.0000 [IU] | Freq: Three times a day (TID) | INTRAMUSCULAR | Status: DC
  Administered 2019-04-21 – 2019-04-22 (×2): 5000 [IU] via SUBCUTANEOUS
  Filled 2019-04-21 (×2): qty 1

## 2019-04-21 MED ORDER — GLYCOPYRROLATE 0.2 MG/ML IJ SOLN
0.3000 mg | INTRAMUSCULAR | Status: DC | PRN
  Filled 2019-04-21: qty 1.5

## 2019-04-21 MED ORDER — LORAZEPAM 2 MG/ML IJ SOLN: 1.0000 mg | INTRAMUSCULAR | Status: DC | PRN

## 2019-04-21 MED ORDER — MORPHINE SULFATE (PF) 2 MG/ML IV SOLN
1.0000 mg | INTRAVENOUS | Status: DC | PRN
  Administered 2019-04-21: 1 mg via INTRAVENOUS
  Filled 2019-04-21: qty 1

## 2019-04-21 MED ORDER — THIAMINE HCL 100 MG/ML IJ SOLN
100.0000 mg | Freq: Every day | INTRAMUSCULAR | Status: DC
  Administered 2019-04-22 – 2019-04-24 (×3): 100 mg via INTRAVENOUS
  Filled 2019-04-21 (×3): qty 2

## 2019-04-21 MED ORDER — PANTOPRAZOLE SODIUM 40 MG IV SOLR
40.0000 mg | INTRAVENOUS | Status: DC
  Administered 2019-04-22: 40 mg via INTRAVENOUS
  Filled 2019-04-21: qty 40

## 2019-04-21 NOTE — Progress Notes (Addendum)
PROGRESS NOTE  [name]. ION:[id number] DOB: Sep 06, 1940 DOA: 03/31/2019 PCP: Derinda Late, MD  Hematologist: Dr. Mike Gip  Brief History   78 year old man PMH including chronic lymphedema, rheumatoid arthritis, seizure disorder, presented with worsening leg infection.  Treated in [place] ED for cellulitis 11/5, discharged on Cipro and clindamycin.  Admitted for bilateral lower extremity cellulitis, failed outpatient therapy, acute kidney injury.  Condition rapidly improved and discharge was planned, however he developed pancytopenia and was seen by oncology.  He developed delirium during the neutropenic fever and was treated empirically.  He was seen by neurology for delirium.  Work-up was unremarkable.  A & P  Pancytopenia, status post bone marrow biopsy November 20, results available November 30.  Initially thought secondary to methotrexate toxicity, however bone marrow concerning for myelodysplastic syndrome..  WBC and platelet counts have recovered. --Discussed with Dr. Mike Gip, she feels this is likely related to methotrexate and not myelodysplastic disorder, but even if it is, her opinion is that the prognosis would be 5 to 10 years.  Fever 11/30.  Chest x-ray no acute disease.   --No recurrence.  No further evaluation suggested at this time  Acute metabolic encephalopathy with intermittent confusion, most likely delirium, superimposed on cognitive decline in the setting of dementia.  CT head was limited but showed no acute abnormalities.  Seen by neurology.  MRI brain was unremarkable.  EEG showed no evidence of seizure.  On minimal medications, no sedating medications.  Discussed with Dr. Doy Mince again today.  He is mildly improved.  Dysphagia. --Dysphagia 1 diet, nectar thick liquid recommended 12/31  Anemia. --Has remained stable.  Continue folic acid supplementation per hematology.  Leukocytosis secondary to Granix. --Trending down appropriately.  We will repeat CBC  in a.m.  Rheumatoid arthritis on immunosuppressive therapy --Medications remain on hold  Seizure disorder --No recurrent seizures.  Lamictal stopped given n.p.o. status.  Discussed with neurology, substituted with IV Vimpat.  Neuropathy --Gabapentin discontinued given encephalopathy.  Chronic lymphedema --No further evaluation suggested.  Pressure ulcers present on admission buttock and sacrum, stage II sacral wound. -- Barrier cream twice daily and PRN soilage.  Contact with dermatherapy underpads and linens will wick moisture and improve skin microclimate to promote healing.   Prognosis remains guarded, unfortunately he did not eat well today per wife, she given some ice cream but he did not swallow it.  He is confused but did participate more when I saw him again this evening approximately 6 PM with his wife at bedside.  We discussed in detail with Dr. Kem Parkinson thoughts as well as Dr. Doy Mince.  We discussed that even though his pancytopenia has essentially recovered, his clinical condition has not.  Most likely at a decision point and will have to decide between artificial nutrition or comfort care.  I discussed this again with wife this night.  I asked the patient what he would want and he spontaneously reported that he would want to try a feeding tube.  She will discuss with her signs and reengage with the medical team tomorrow.  Resolved Hospital Problem list    Neutropenia with fever, resolved, followed by infectious disease. --No further evaluation suggested.  Acute kidney injury  Cellulitis bilateral lower extremities.   DVT prophylaxis: heparin Code Status: Full Family Communication: Discussed with wife at bedside yesterday afternoon.  We reviewed current issues and discussed delirium. Disposition Plan: SNF    Murray Hodgkins, MD  [facility] Direct contact: see [email or web address] (further directions at bottom of note if  needed) 7PM-7AM contact night coverage as at  bottom of note 04/21/2019, 7:09 PM  LOS: 20 days   Significant Hospital Events   . 11/10 admitted . 11/12 discharge planned but had difficulty walking  . 11/14 developed pancytopenia . 11/15 hematology consultation . 11/18 ID consultation . 11/20 developed fever 101.2, treated for febrile neutropenia with empiric antibiotics . 11/21 treated with Neupogen for severe neutropenia . 11/22 neurology consulted for altered mental status   Consults:  . Hematology . ID . Neurology   Procedures:  . 11/20 CT-guided bone marrow aspiration and biopsy  Significant Diagnostic Tests:  . 11/24 EEG mild diffuse encephalopathy, nonspecific.  No seizures . 11/22 MRI brain no acute intracranial abnormality . 11/21 CT head Limited exam.  No gross abnormalities noted. . 11/11 renal ultrasound negative for obstruction . 11/20 bone marrow The features in the marrow are worrisome for a myelodysplastic syndrome.  Correlation with cytogenetics and NGS is recommended; an addendum will  be issued.    Micro Data:  .    Antimicrobials:  .   Interval History/Subjective  Seems to feel okay today.,  More awake but obviously confused.  History not clearly reliable.  Objective   Vitals:  Vitals:   04/21/19 1230 04/21/19 1842  BP:    Pulse: 100 98  Resp:    Temp:    SpO2:      Exam:  Constitutional.  Appears calm, comfortable.  More awake today. Respiratory.  Clear to auscultation bilaterally.  No wheezes, rales or rhonchi.  Normal respiratory effort. Cardiovascular.  Regular rate and rhythm.  No murmur, rub or gallop.  No change and chronic lymphedema. Psychiatric.  He is more awake today but does not answer questions appropriately, and tends to repeat phrases. Abdomen soft.  I have personally reviewed the following:   Today's Data  . CBG stable  Scheduled Meds: . [START ON 04/22/2019] feeding supplement (NEPRO CARB STEADY)  237 mL Oral BID BM  . [START ON 46/09/352] folic acid  1 mg  Intravenous Daily  . heparin injection (subcutaneous)  5,000 Units Subcutaneous Q8H  . metoprolol tartrate  2.5 mg Intravenous Q6H  . [START ON 04/22/2019] pantoprazole (PROTONIX) IV  40 mg Intravenous Q24H  . sodium chloride flush  3 mL Intravenous Q12H  . [START ON 04/22/2019] thiamine injection  100 mg Intravenous Daily   Continuous Infusions: . sodium chloride Stopped (04/21/19 0441)  . lacosamide (VIMPAT) IV 100 mg (04/21/19 0959)    Principal Problem:   Neutropenia with fever (HCC) Active Problems:   Rheumatoid arthritis (HCC)   Seizure (HCC)   Pressure injury of skin   Other pancytopenia (HCC)   Folic acid deficiency   Cellulitis of lower extremity   AKI (acute kidney injury) ([place])   Pancytopenia ([place])   Acute metabolic encephalopathy   Oral mucositis   LOS: 20 days   How to contact the [facility] Attending or Consulting provider [place] or covering provider during after hours [place], for this patient?  1. Check the care team in [facility] and look for a) attending/consulting TRH provider listed and b) the [facility] team listed 2. Log into [email or web address] and use [place]'s universal password to access. If you do not have the password, please contact the hospital operator. 3. Locate the [facility] provider you are looking for under [facility] and page to a number that you can be directly reached. 4. If you still have difficulty reaching the provider, please page the [facility] (Director  on Call) for the Hospitalists listed on amion for assistance.    Time spent greater than 35 minutes, greater than 50% in counseling coordination of care, discussion with Dr. Doy Mince, discussion with Dr. Mike Gip, discussion with wife at bedside

## 2019-04-21 NOTE — Progress Notes (Signed)
Nutrition Follow Up Note   DOCUMENTATION CODES:   Not applicable  INTERVENTION:   Nepro Shake po BID, each supplement provides 425 kcal and 19 grams protein  Magic cup TID with meals, each supplement provides 290 kcal and 9 grams of protein  NUTRITION DIAGNOSIS:   Increased nutrient needs related to wound healing as evidenced by increased estimated needs.  GOAL:   Patient will meet greater than or equal to 90% of their needs -not met   MONITOR:   PO intake, Supplement acceptance, Labs, Weight trends, Skin, I & O's  ASSESSMENT:   78 y/o male with a history of HTN, seizures and RA on immunosuppressant therapy who presented on 11/10 with complaints of worsening cellulitis.   Pt continues to have poor appetite and oral intake. Pt holding food in his mouth yesterday was seen by SLP who recommended NPO as pt at high aspiration risk. Pt placed on a dysphagia 1/nectar thick diet today. RD will add nectar thick supplements. Pt would likely need PEG tube placement and nutrition support to meet his estimated needs. Spoke with MD, plan is for palliative care consult to discuss [place] with family. RD will monitor for GOC.   Pt's weight has remained stable since admit.   Medications reviewed and include: folic acid, heparin, protonix, B1  Labs reviewed: wbc 21.5(H), Hgb 8.4(L), Hct 25.3(L)- 11/29   Diet Order:   Diet Order            DIET - DYS 1 Room service appropriate? Yes with Assist; Fluid consistency: Nectar Thick  Diet effective now        Diet - low sodium heart healthy             EDUCATION NEEDS:   Education needs have been addressed  Skin:  Skin Assessment: Reviewed RN Assessment(ecchymosis, Scrotum: 0.3cm x 0.3cm x 0.1cm, Right inner thigh: 0.5cm x 0.3cm x 0.1cm)  Last BM:  11/30  Height:   Ht Readings from Last 1 Encounters:  04/12/19 6' (1.829 m)    Weight:   Wt Readings from Last 1 Encounters:  04/20/19 117 kg    Ideal Body Weight:  80.9 kg  BMI:   Body mass index is 34.98 kg/m.  Estimated Nutritional Needs:   Kcal:  2300-2600kcal/day  Protein:  115-130g/day  Fluid:  >2L/day  Koleen Distance MS, RD, LDN Pager #- [phone] Office#- [phone] After Hours Pager: [phone]

## 2019-04-21 NOTE — Progress Notes (Addendum)
  Speech Language Pathology Treatment: Dysphagia  Patient Details Name: Jeremiah [name]. MRN: [id number] DOB: 1940-08-22 Today's Date: 04/21/2019 Time: 2353-6144 SLP Time Calculation (min) (ACUTE ONLY): 50 min  Assessment / Plan / Recommendation Clinical Impression  Pt seen today for ongoing assessment of swallowing function and toleration of oral intake. Pt presented w/ much improved Alertness and Awareness for his environment and for eating/drinking tasks. He appeared excited to have something to drink saying "woo-wee" and laughing often. Much oral care needed d/t dried mucous/secretions hanging in mouth; sores on lip/gum areas that had been bleeding recently. Pt appeared to adequately tolerate trials of Nectar consistency liquids VIA TSP and purees following general aspiration precautions w/ No overt, clinical s/s of aspiration noted - no coughing and no decline in respiratory status during/post po intake. Oral phase was adequate for bolus management of these consistencies w/ oral clearing achieved post trials. Pt required full feeding support and verbal/tactile cues during session.  Recommend initiation of Dysphagia level 1 diet (puree) w/ Nectar consistency liquids w/ strict aspiration precautions and feeding support; Pills given Crushed in Puree for safer swallowing. Recommend Dietician f/u for support. ST services will continue to monitor pt's status while admitted. NSG updated.     HPI HPI: Pt is a 78 y.o. male with multiple medical problems including chronic lymphedema, venous stasis, rheumatoid arthritis (Plaquenil, MTX, Enbrel), and multiple spine surgeries.  He was recently seen at [facility] ER (03/26/2019) for he was started on Cipro and clindamycin for bilateral lower extremity cellulitis.  He presented to the ED with complaints of failed outpatient therapy for his lower extremity cellulitis.  Note patient was also found to have pressure ulcers located on his bilateral ankles, buttocks, and  mid sacrum.  This admission patient has been followed by oncology s/p bone marrow biopsy for work-up of severe pancytopenia w/ increased delirium, thrombocytopenia, leukopenia which is felt to be due to myelosuppression from methotrexate.  Is also being followed by ID due to oral mucositis and neutropenia.  Continues to have poor oral nutrition intake and is being followed by dietitian.  Palliative medicine team consulted for goals of care.  He was seen by neurology for delirium - "Acute metabolic encephalopathy with intermittent confusion, most likely delirium, superimposed on cognitive decline in the setting of Dementia". Nursing has noted increased confusion and oral holding/pocketing of foods at meals.       SLP Plan  Continue with current plan of care       Recommendations  Diet recommendations: Dysphagia 1 (puree);Nectar-thick liquid Liquids provided via: Teaspoon;Cup;No straw Medication Administration: Crushed with puree(for safer swallowing) Supervision: Full supervision/cueing for compensatory strategies;Trained caregiver to feed patient Compensations: Minimize environmental distractions;Slow rate;Small sips/bites;Lingual sweep for clearance of pocketing;Multiple dry swallows after each bite/sip;Follow solids with liquid Postural Changes and/or Swallow Maneuvers: Seated upright 90 degrees;Upright 30-60 min after meal                General recommendations: (Dietician f/u; palliative care following) Oral Care Recommendations: Oral care BID;Oral care before and after PO;Staff/trained caregiver to provide oral care Follow up Recommendations: Skilled Nursing facility(TBD) SLP Visit Diagnosis: Dysphagia, oropharyngeal phase (R13.12)(cognitive decline baseline) Plan: Continue with current plan of care       [place], [place], CCC-SLP Watson,Katherine 04/21/2019, 5:25 PM

## 2019-04-21 NOTE — Progress Notes (Signed)
Received notification from care team patient's family no longer wish for him to be comfort care. Will re-order previous medications etc as previously ordered. Reactiviate MEWS score per nursing protocol.This does not require a provider's order. Noted MEWS score via flowsheet for today is Green.   Alda Lea, AGPCNP-BC Palliative Medicine Team  Pager: [phone] Amion: N. Cousar   NO CHARGE

## 2019-04-21 NOTE — Progress Notes (Signed)
Daily Progress Note   Patient Name: Jeremiah [name].       Date: 04/21/2019 DOB: March 05, 1941  Age: 78 y.o. MRN#: [id number] Attending Physician: Samuella Cota, MD Primary Care Physician: Derinda Late, MD Admit Date: 03/31/2019  Reason for Consultation/Follow-up: Establishing goals of care  Subjective: Patient sleeping but awakens to verbal stimuli. He is confused. Will not follow commands. When you touch his arms or legs he moans or yells "ouch". Wife, [name] is at the bedside.   [name] provided updates and concerns on patient's poor prognosis and continued decline in his health. She verbalizes understanding and confirming similar updates from attending (Dr. Sarajane Jews). Support given. At the bedside I assisted with repositioning and sitting patient up some in bed. Lunch tray at the bedside. Wife hoping to see if he will eat something. All aspiration precautions followed. Wife attempted to feed patient a small amount less than a dime size of ice cream. Patient noted to hold content in mouth despite prompts to swallow. I was able to remove content for safety. After a few minutes another small amount given (half of dime size) to patient at wife's request. Patient moving content around in mouth this time, and attempting to swallow. He refused any more when she attempted. After a few minutes patient observed gagging as if he was going to throw up. Stopped and eyes closed as if sleeping. Observed similar behavior again and he began coughing. Small amount of content and clear mucous spit out by patient. Wife advised due to observed behavior would not recommend attempting to feed any further at this time with concerns for aspiration. She verbalized understanding and agreement expressing similar  behaviors yesterday.   Wife became tearful expressing her wishes for him not to go on in such state and heartbreak of seeing him this way. Therapeutic listening and emotional support provided. Patient showing no response to wife at bedside with eyes remaining closed. She attempted to hold hand however he would not reciprocate and continue to grimace and moan as if her holding his right hand was causing pain. Wife requested to step away from bedside and talk.   Detailed discussion with wife regarding poor prognosis and concerns with poor nutritional intake. She is tearful in expressing her understanding and previous hopes that he would improve, now with hopes  that he would not suffer. Support given. We discussed options such as feeding tube with detailed education on risk and benefits in elderly dementia patients. Wife confirmed that she would was not interested in any forms of artificial feedings such as PEG. She shared memories of her husband comparing his previous quality of life. [name] expresses he would not want to live in his current state and knows he would want to be allowed to be comfortable and pass away.   [name] shares patient's love for his church, singing, children, his siblings (5), and her. Their 49th wedding anniversary is this coming Saturday. She is emotional sharing memories over the years. Emotional support provided. I offered spiritual team support however she declined.   Given [name]'s expressed wishes for patient not to have artificial feeding, importance of his quality of life and wishes for comfort, I used this opportunity to discuss options for continued aggressive medical interventions versus comfort/EOL care. Wife is requesting to transition all care to comfort. I educated her in detail what comfort care would look like for patient. She verbalized understanding and again confirmed wishes to transition to comfort. I discussed DNR/DNI in the setting patient is currently  a full code. Wife verbalized agreement requesting DNR and also consideration for hospice home.   I educated her briefly on the goals and philosophy of hospice. She verbalized understanding expressing previous experiences with her mother and hospice. She states her preferred location is for [facility] so that she could safely make the drive to be with him. Support given. I educated [name] on the referral, approval, and transfer process to hospice home. She verbalized understanding.   I offered to call and speak with her son(s). She declined expressing she knows they will support and agree with her decisions. I educated her on the visitation policy in the setting of full comfort/EOL care. She verbalized understanding and appreciation.   Support given. All questions answered. [name] has my contact information for any further questions or needs.   Length of Stay: 20  Current Medications: Scheduled Meds:   mouth rinse  15 mL Mouth Rinse q12n4p   pantoprazole (PROTONIX) IV  40 mg Intravenous Q24H   sodium chloride flush  3 mL Intravenous Q12H    Continuous Infusions:  sodium chloride Stopped (04/21/19 0441)   lacosamide (VIMPAT) IV 100 mg (04/21/19 0959)    PRN Meds: lip balm, magic mouthwash w/lidocaine, [DISCONTINUED] ondansetron **OR** ondansetron (ZOFRAN) IV  Physical Exam         -sleeping but awakes with verbal stimuli, confused,NAD, chronically-ill appearing, frail -RRR -CTA -lymphedema noted, complains of pain on touch -confused, will not follow commands, poor po intake  Vital Signs: BP 134/77 (BP Location: Left Arm)    Pulse 100    Temp 97.8 F (36.6 C) (Axillary)    Resp 20    Ht 6' (1.829 m)    Wt 117 kg    SpO2 97%    BMI 34.98 kg/m  SpO2: SpO2: 97 % O2 Device: O2 Device: Room Air O2 Flow Rate: O2 Flow Rate (L/min): 2 L/min  Intake/output summary:   Intake/Output Summary (Last 24 hours) at 04/21/2019 1309 Last data filed at 04/21/2019 0932 Gross per 24 hour   Intake 1190.45 ml  Output --  Net 1190.45 ml   LBM: Last BM Date: 04/19/19 Baseline Weight: Weight: 113.4 kg Most recent weight: Weight: 117 kg       Palliative Assessment/Data: PPS 10-20%  Patient Active Problem List   Diagnosis Date Noted   Oral mucositis 04/14/2019   Neutropenia with fever ([place]) 123XX123   Acute metabolic encephalopathy 123XX123   Pancytopenia ([place])    Cellulitis of lower extremity [id number]   Folic acid deficiency    AKI (acute kidney injury) ([place])    Pressure injury of skin 04/05/2019   Other pancytopenia ([place])    Cellulitis 03/31/2019   Edema of both legs 08/19/2018   History of allergy to latex 07/22/2018   Lymphedema 06/16/2018   Chronic venous insufficiency 06/16/2018   Venous ulcer of leg ([place]) 06/16/2018   PAD (peripheral artery disease) ([place]) 06/16/2018   Diabetes mellitus type 2, uncomplicated ([place]) 0000000   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   Obesity (BMI 30.0-34.9) 02/27/2018   Primary osteoarthritis of left knee 02/27/2018   Urinary retention 02/25/2018   Rheumatoid arthritis involving multiple sites ([place]) 02/25/2018   Left knee pain 02/20/2018   Neurogenic pain 10/02/2017   Spondylosis without myelopathy or radiculopathy, lumbosacral region 07/17/2017   Trigger point with back pain (recurrent/intermittent) 07/17/2017   Chronic musculoskeletal pain 02/27/2017   Myofascial pain syndrome 02/27/2017   Long term current use of anticoagulant (Plaquenil) 02/27/2017   Chronic lumbar radiculitis (L5 dermatome) (Right) 02/27/2017   Chronic lumbar radiculitis (L3 dermatome) (Left) 02/27/2017   Chronic foot pain (Right) 02/27/2017   Chronic thoracic back pain (Bilateral) (R>L) 02/27/2017   Chronic neck pain (posterior) (Bilateral) (R>L) 02/27/2017   Chronic shoulder pain (Bilateral) (L>R) 02/27/2017   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) 12/17/2013   Benign essential hypertension 11/13/2013   Chronic low back pain (Primary Area of Pain) (Bilateral) (R>L) 11/13/2013   Prostatitis 11/13/2013   Seizure ([place]) 11/13/2013   Left bundle branch block 05/21/2013   Dorsalgia 05/21/2013    Palliative Care Assessment & Plan   Assessment/Recommendations/Plan:  DNR/DNI-as requested by wife  Transition to full comfort/EOL as requested by wife  Referral placed for hospice home. Wife's preferred location is [place]  Will d/c all orders not comfort focused  Morphine PRN for pain/air hunger  Robinul PRN for secretions  Liquifilm tears PRN for dry eyes  Ativan PRN for agitation/anxiety  Zofran PRN for nausea  Will continue Vimpat as ordered for comfort/seizures  May have comfort foods, with awareness of high risk for aspiration, following all guidelines as listed by  SLP  Unrestricted visitation per policy in EOL setting  PMT will continue to support and follow   Goals of Care and Additional Recommendations:  Limitations on Scope of Treatment: Full Comfort Care  Code Status:    Code Status Orders  (From admission, onward)         Start     Ordered   03/31/19 1819  Full code  Continuous     03/31/19 1819        Code Status History    Date Active Date Inactive Code Status Order ID Comments User Context   02/20/2018 2220 02/24/2018 2019 Full Code [id number]  Lance Coon, MD ED   05/23/2015 2221 05/26/2015 1741 Full Code [id number]  Hower, Aaron Mose, MD ED   Advance Care Planning Activity       Prognosis:   2 weeks or less in the setting of pancytopenia s/p BMBX with concerns for MDS, fever, dementia, dysphagia, poor po intake, anemia, chronic lymphedema, sacral ulcer, deconditioned, bedbound  Discharge Planning:  [place] was discussed with wife, bedside RN, and Dr. Sarajane Jews.   Thank you for allowing the Palliative Medicine Team to assist in the care of this patient.   Time In: 1220 Time Out: 1315 Total Time 55 min.  Prolonged Time Billed  Yes      Greater than 50%  of this time was spent counseling and coordinating care related to the above assessment and plan.  Alda Lea, AGPCNP-BC Palliative Medicine Team   Please contact Palliative Medicine Team phone at [phone] for questions and concerns.

## 2019-04-21 NOTE — Progress Notes (Signed)
Subjective: Patient improved today.  Awake and alert.  Follows commands.    Objective: Current vital signs: BP 134/77 (BP Location: Left Arm)   Pulse 100   Temp 97.8 F (36.6 C) (Axillary)   Resp 20   Ht 6' (1.829 m)   Wt 117 kg   SpO2 97%   BMI 34.98 kg/m  Vital signs in last 24 hours: Temp:  [97.8 F (36.6 C)-99.3 F (37.4 C)] 97.8 F (36.6 C) (12/01 1200) Pulse Rate:  [98-117] 100 (12/01 1230) Resp:  [18-20] 20 (12/01 0441) BP: (121-134)/(69-93) 134/77 (12/01 1200) SpO2:  [96 %-100 %] 97 % (12/01 1200)  Intake/Output from previous day: 11/30 0701 - 12/01 0700 In: 1140.5 [I.V.:1105.5; IV Piggyback:35] Out: -  Intake/Output this shift: Total I/O In: 720.3 [P.O.:50; I.V.:600.3; IV Piggyback:70] Out: -  Nutritional status:  Diet Order            DIET - DYS 1 Room service appropriate? Yes with Assist; Fluid consistency: Nectar Thick  Diet effective now        Diet - low sodium heart healthy              Neurologic Exam: Mental Status: Alert and awake today.  Knows he is in the hospital.  Speech fluent.  Some perseveration.  Follows commands. Cranial Nerves: II: Visual fields grossly normal, pupils equal, round, reactive to light and accommodation III,IV, VI: ptosis not present, extra-ocular motions intact bilaterally V,VII: smile symmetric, facial light touch sensation normal bilaterally VIII: hearing normal bilaterally IX,X: gag reflex present XI: bilateral shoulder shrug XII: midline tongue extension Motor: Moves all extremities, upper extremities more than lower extremities Sensory: Pinprick and light touch intact throughout, bilaterally  Lab Results: Basic Metabolic Panel: Recent Labs  Lab 04/15/19 0429 04/16/19 0540 04/17/19 0849 04/19/19 0456  NA 147* 142 139 138  K 3.7 3.6 4.0 3.7  CL 117* 112* 106 105  CO2 _0 GLUCOSE 101* 89 94 100*  BUN 10 8 7* 8  CREATININE 1.00 0.89 0.76 0.92  CALCIUM 7.9* 8.0* 8.2* 8.4*    Liver  Function Tests: No results for input(s): AST, ALT, ALKPHOS, BILITOT, PROT, ALBUMIN in the last 168 hours. No results for input(s): LIPASE, AMYLASE in the last 168 hours. No results for input(s): AMMONIA in the last 168 hours.  CBC: Recent Labs  Lab 04/15/19 0429 04/16/19 0540 04/17/19 0849 04/19/19 0456  WBC 37.7* 39.2* 27.0* 21.5*  NEUTROABS 16.1*  --   --   --   HGB 8.2* 8.5* 8.7* 8.4*  HCT 25.7* 26.3* 26.6* 25.3*  MCV 87.4 85.4 85.8 83.2  PLT 502* 547* 594* 583*    Cardiac Enzymes: No results for input(s): CKTOTAL, CKMB, CKMBINDEX, TROPONINI in the last 168 hours.  Lipid Panel: No results for input(s): CHOL, TRIG, HDL, CHOLHDL, VLDL, LDLCALC in the last 168 hours.  CBG: Recent Labs  Lab 04/20/19 2135 04/21/19 0127 04/21/19 0345 04/21/19 0748 04/21/19 1156  GLUCAP 84 93 84 98 109*    Microbiology: Results for orders placed or performed during the hospital encounter of 03/31/19  Culture, blood (routine x 2)     Status: None   Collection Time: 03/31/19  2:40 PM   Specimen: BLOOD  Result Value Ref Range Status   Specimen Description BLOOD LEFT ANTECUBITAL  Final   Special Requests   Final    BOTTLES DRAWN AEROBIC AND ANAEROBIC Blood Culture adequate volume   Culture   Final    NO  GROWTH 11 DAYS Performed at [facility], [address] [place], [place] [address]    Report Status 04/11/2019 FINAL  Final  Culture, blood (routine x 2)     Status: None   Collection Time: 03/31/19  2:40 PM   Specimen: BLOOD  Result Value Ref Range Status   Specimen Description BLOOD BLOOD LEFT HAND  Final   Special Requests   Final    BOTTLES DRAWN AEROBIC AND ANAEROBIC Blood Culture adequate volume   Culture   Final    NO GROWTH 11 DAYS Performed at [facility], [address] [place], [place] [address]    Report Status 04/11/2019 FINAL  Final  SARS CORONAVIRUS 2 (TAT 6-24 HRS) Nasopharyngeal Nasopharyngeal Swab     Status: None   Collection Time:  03/31/19  2:51 PM   Specimen: Nasopharyngeal Swab  Result Value Ref Range Status   SARS Coronavirus 2 NEGATIVE NEGATIVE Final    Comment: (NOTE) SARS-CoV-2 target nucleic acids are NOT DETECTED. The SARS-CoV-2 RNA is generally detectable in upper and lower respiratory specimens during the acute phase of infection. Negative results do not preclude SARS-CoV-2 infection, do not rule out co-infections with other pathogens, and should not be used as the sole basis for treatment or other patient management decisions. Negative results must be combined with clinical observations, patient history, and epidemiological information. The expected result is Negative. Fact Sheet for Patients: SugarRoll.be Fact Sheet for Healthcare Providers: https://www.woods-mathews.com/ This test is not yet approved or cleared by the [place] FDA and  has been authorized for detection and/or diagnosis of SARS-CoV-2 by FDA under an Emergency Use Authorization (EUA). This EUA will remain  in effect (meaning this test can be used) for the duration of the COVID-19 declaration under Section 56 4(b)(1) of the Act, 21 U.S.C. section 360bbb-3(b)(1), unless the authorization is terminated or revoked sooner. Performed at [facility], [place] [address], [place], [place] [address]   Culture, blood (x 2)     Status: None   Collection Time: 04/05/19 11:17 AM   Specimen: BLOOD  Result Value Ref Range Status   Specimen Description BLOOD LEFT ANTECUBITAL  Final   Special Requests   Final    BOTTLES DRAWN AEROBIC AND ANAEROBIC Blood Culture adequate volume   Culture   Final    NO GROWTH 5 DAYS Performed at [facility], [address], [place], [place] [address]    Report Status 04/10/2019 FINAL  Final  Culture, blood (x 2)     Status: None   Collection Time: 04/05/19 11:25 AM   Specimen: BLOOD  Result Value Ref Range Status   Specimen Description BLOOD RIGHT  ANTECUBITAL  Final   Special Requests   Final    BOTTLES DRAWN AEROBIC AND ANAEROBIC Blood Culture adequate volume   Culture   Final    NO GROWTH 5 DAYS Performed at [facility], [address], [place], [place] [address]    Report Status 04/10/2019 FINAL  Final  SARS CORONAVIRUS 2 (TAT 6-24 HRS) Nasopharyngeal Nasopharyngeal Swab     Status: None   Collection Time: 04/08/19  2:04 PM   Specimen: Nasopharyngeal Swab  Result Value Ref Range Status   SARS Coronavirus 2 NEGATIVE NEGATIVE Final    Comment: (NOTE) SARS-CoV-2 target nucleic acids are NOT DETECTED. The SARS-CoV-2 RNA is generally detectable in upper and lower respiratory specimens during the acute phase of infection. Negative results do not preclude SARS-CoV-2 infection, do not rule  out co-infections with other pathogens, and should not be used as the sole basis for treatment or other patient management decisions. Negative results must be combined with clinical observations, patient history, and epidemiological information. The expected result is Negative. Fact Sheet for Patients: SugarRoll.be Fact Sheet for Healthcare Providers: https://www.woods-mathews.com/ This test is not yet approved or cleared by the [place] FDA and  has been authorized for detection and/or diagnosis of SARS-CoV-2 by FDA under an Emergency Use Authorization (EUA). This EUA will remain  in effect (meaning this test can be used) for the duration of the COVID-19 declaration under Section 56 4(b)(1) of the Act, 21 U.S.C. section 360bbb-3(b)(1), unless the authorization is terminated or revoked sooner. Performed at [facility], [place] [address] [place], [place] [address]   CULTURE, BLOOD (ROUTINE X 2) w Reflex to ID Panel     Status: None   Collection Time: 04/10/19 11:55 PM   Specimen: BLOOD  Result Value Ref Range Status   Specimen Description BLOOD RIGHT ANTECUBITAL  Final    Special Requests   Final    BOTTLES DRAWN AEROBIC AND ANAEROBIC Blood Culture adequate volume   Culture   Final    NO GROWTH 5 DAYS Performed at [facility], [address] [place], [place] [address]    Report Status 04/16/2019 FINAL  Final  CULTURE, BLOOD (ROUTINE X 2) w Reflex to ID Panel     Status: None   Collection Time: 04/10/19 11:59 PM   Specimen: BLOOD  Result Value Ref Range Status   Specimen Description BLOOD RIGHT ARM  Final   Special Requests   Final    BOTTLES DRAWN AEROBIC AND ANAEROBIC Blood Culture adequate volume   Culture   Final    NO GROWTH 5 DAYS Performed at [facility], [address] [place], [place] [address]    Report Status 04/16/2019 FINAL  Final  Urine Culture     Status: None   Collection Time: 04/11/19  2:00 AM   Specimen: Urine, Random  Result Value Ref Range Status   Specimen Description   Final    URINE, RANDOM Performed at [facility], [address], [place], [place] [address]    Special Requests   Final    NONE Performed at [facility], [address] [place], [place] [address]    Culture   Final    NO GROWTH Performed at [facility], [place] [address], [place], [place] [address]    Report Status 04/12/2019 FINAL  Final    Coagulation Studies: No results for input(s): LABPROT, INR in the last 72 hours.  Imaging: Dg Chest Port 1 View  Result Date: 04/20/2019 CLINICAL DATA:  New onset of fever. History seizures. EXAM: PORTABLE CHEST 1 VIEW COMPARISON:  Radiographs 04/11/2019 and 02/20/2018. FINDINGS: 1338 hours. Overall pulmonary aeration has improved compared with the prior study. The heart size and mediastinal contours are stable. Mild atelectasis is present at both lung bases. There is no confluent airspace opacity, edema, pleural effusion or pneumothorax. The bones appear unchanged. IMPRESSION: Mild bibasilar atelectasis. No evidence of pneumonia or other acute process.  Electronically Signed   By: Richardean Sale M.D.   On: 04/20/2019 13:47    Medications:  I have reviewed the patient's current medications. Scheduled: . sodium chloride flush  3 mL Intravenous Q12H    Assessment/Plan: 78 y.o. male with a history of HTN, seizures and RA on immunosuppressant therapy whose family reports complaints  of worsening mental status, present for at least the past year and worsening with recent illness and hospitalization. Patient with a family history of dementia.  MRI of the brain performed and shows no acute changes.  EEG only significant for slowing.  TSH, B12, ESR, B1 and serum ammonia are unremarkable.  Patient with cognitive decline in setting of dementia and progressive cognitive declines complicated by metabolic encephalopathy.  As lab work improves cognitive function may improve somewhat but will lag behind improvement in lab work and will be complicated by continued hospitalization. Today patient improved from yesterday.  Medication regimen simplified.  On Vimpat.     Recommendations: 1. PT/OT as tolerated 2. Will continue to follow with you.     LOS: 20 days   Alexis Goodell, MD Neurology [phone] 04/21/2019  3:48 PM

## 2019-04-21 NOTE — Progress Notes (Signed)
Physical Therapy Treatment Patient Details Name: Jeremiah [name]. MRN: [id number] DOB: 1941/03/23 Today's Date: 04/21/2019    History of Present Illness  78 y.o. male with Past medical history of rheumatoid arthritis on immunosuppressive therapy, seizures, HTN, chronic lymphedema.  Went to [facility] last week with cellulitis and got antibiotics, however LE swelling had continued along with increased difficulty with mobility and strength.    PT Comments    Pt had just finished care with nursing for BM - awake and agrees to session.  Pt does remain today but talkative during session. Participated in exercises as described below.  Pt with overall improved tolerance for LE AAROM today with only occasional flinching due to pain.  He does not assist much with LE ex but does do his own UE ex's during tasks.    Breakfast tray arrived during session and awaiting staff to assist with feeding.   Follow Up Recommendations  SNF     Equipment Recommendations  None recommended by PT    Recommendations for Other Services       Precautions / Restrictions Precautions Precautions: Fall Restrictions Weight Bearing Restrictions: No    Mobility  Bed Mobility               General bed mobility comments: deferred- pt had just finished care with nursing for BM and fatigued  Transfers                    Ambulation/Gait                 Stairs             Wheelchair Mobility    Modified Rankin (Stroke Patients Only)       Balance                                            Cognition Arousal/Alertness: Awake/alert Behavior During Therapy: WFL for tasks assessed/performed Overall Cognitive Status: Impaired/Different from baseline                                 General Comments: confused but alert today      Exercises Total Joint Exercises Ankle Circles/Pumps: Strengthening;Both;5 reps;10 reps;Other (comment)(manual resistance  provided at tolerated by pt) Quad Sets: Strengthening;Both;5 reps;10 reps Heel Slides: AAROM;Both;10 reps;5 reps Hip ABduction/ADduction: AAROM;Both;10 reps;5 reps Straight Leg Raises: AAROM;Both;10 reps;5 reps    General Comments        Pertinent Vitals/Pain Pain Assessment: Faces Faces Pain Scale: Hurts little more Pain Location: flinches at times with ROM but overall tolerates much better Pain Descriptors / Indicators: Grimacing Pain Intervention(s): Limited activity within patient's tolerance;Monitored during session    Home Living                      Prior Function            PT Goals (current goals can now be found in the care plan section) Progress towards PT goals: Not progressing toward goals - comment    Frequency    Min 2X/week      PT Plan Current plan remains appropriate    Co-evaluation              AM-PAC PT "6 Clicks" Mobility   Outcome Measure  Help  needed turning from your back to your side while in a flat bed without using bedrails?: Total Help needed moving from lying on your back to sitting on the side of a flat bed without using bedrails?: Total Help needed moving to and from a bed to a chair (including a wheelchair)?: Total Help needed standing up from a chair using your arms (e.g., wheelchair or bedside chair)?: Total Help needed to walk in hospital room?: Total Help needed climbing 3-5 steps with a railing? : Total 6 Click Score: 6    End of Session   Activity Tolerance: Patient tolerated treatment well Patient left: in bed;with call bell/phone within reach;with family/visitor present Nurse Communication: Mobility status       Time: [id number] PT Time Calculation (min) (ACUTE ONLY): 13 min  Charges:  $Therapeutic Exercise: 8-22 mins                     Chesley Noon, PTA 04/21/19, 10:45 AM

## 2019-04-22 LAB — CBC WITH DIFFERENTIAL/PLATELET
Abs Immature Granulocytes: 1.62 10*3/uL — ABNORMAL HIGH (ref 0.00–0.07)
Basophils Absolute: 0.2 10*3/uL — ABNORMAL HIGH (ref 0.0–0.1)
Basophils Relative: 1 %
Eosinophils Absolute: 0 10*3/uL (ref 0.0–0.5)
Eosinophils Relative: 0 %
HCT: 23.3 % — ABNORMAL LOW (ref 39.0–52.0)
Hemoglobin: 8.1 g/dL — ABNORMAL LOW (ref 13.0–17.0)
Immature Granulocytes: 6 %
Lymphocytes Relative: 11 %
Lymphs Abs: 2.9 10*3/uL (ref 0.7–4.0)
MCH: 28 pg (ref 26.0–34.0)
MCHC: 34.8 g/dL (ref 30.0–36.0)
MCV: 80.6 fL (ref 80.0–100.0)
Monocytes Absolute: 3.8 10*3/uL — ABNORMAL HIGH (ref 0.1–1.0)
Monocytes Relative: 15 %
Neutro Abs: 17.3 10*3/uL — ABNORMAL HIGH (ref 1.7–7.7)
Neutrophils Relative %: 67 %
Platelets: 472 10*3/uL — ABNORMAL HIGH (ref 150–400)
RBC: 2.89 MIL/uL — ABNORMAL LOW (ref 4.22–5.81)
RDW: 20.2 % — ABNORMAL HIGH (ref 11.5–15.5)
WBC: 25.8 10*3/uL — ABNORMAL HIGH (ref 4.0–10.5)
nRBC: 0.5 % — ABNORMAL HIGH (ref 0.0–0.2)

## 2019-04-22 LAB — FOLATE: Folate: 11.5 ng/mL (ref >5.9)

## 2019-04-22 LAB — MAGNESIUM: Magnesium: 2 mg/dL (ref 1.7–2.4)

## 2019-04-22 LAB — BASIC METABOLIC PANEL
Anion gap: 7 (ref 5–15)
BUN: 11 mg/dL (ref 8–23)
CO2: 23 mmol/L (ref 22–32)
Calcium: 8.2 mg/dL — ABNORMAL LOW (ref 8.9–10.3)
Chloride: 112 mmol/L — ABNORMAL HIGH (ref 98–111)
Creatinine, Ser: 0.95 mg/dL (ref 0.61–1.24)
GFR calc Af Amer: >60 mL/min (ref >60)
GFR calc non Af Amer: >60 mL/min (ref >60)
Glucose, Bld: 87 mg/dL (ref 70–99)
Potassium: 3.1 mmol/L — ABNORMAL LOW (ref 3.5–5.1)
Sodium: 142 mmol/L (ref 135–145)

## 2019-04-22 LAB — GLUCOSE, CAPILLARY: Glucose-Capillary: 90 mg/dL (ref 70–99)

## 2019-04-22 MED ORDER — POTASSIUM CHLORIDE IN NACL 20-0.9 MEQ/L-% IV SOLN
INTRAVENOUS | Status: DC
  Administered 2019-04-22 – 2019-04-23 (×2): via INTRAVENOUS
  Filled 2019-04-22 (×2): qty 1000

## 2019-04-22 NOTE — Progress Notes (Signed)
[facility] Hematology/Oncology Progress Note  Date of admission: 03/31/2019  Hospital day:  04/21/2019  Chief Complaint: Jeremiah [name]. is a 78 y.o. male with rheumatoid arthritis and venous stasis/chronic lymphedema who was admitted through the ER with bilateral lower extremity cellultis.  Subjective:  The patient is confused today.   Social History: The patient is accompanied by his wife today.  Allergies:  Allergies  Allergen Reactions  . Cefoperazone Anaphylaxis  . Penicillins Anaphylaxis and Other (See Comments)    Has patient had a PCN reaction causing immediate rash, facial/tongue/throat swelling, SOB or lightheadedness with hypotension: Yes Has patient had a PCN reaction causing severe rash involving mucus membranes or skin necrosis: No Has patient had a PCN reaction that required hospitalization No Has patient had a PCN reaction occurring within the last 10 years: No If all of the above answers are "NO", then may proceed with Cephalosporin use.  . Latex Rash    Scheduled Medications: . feeding supplement (NEPRO CARB STEADY)  237 mL Oral BID BM  . folic acid  1 mg Intravenous Daily  . heparin injection (subcutaneous)  5,000 Units Subcutaneous Q8H  . metoprolol tartrate  2.5 mg Intravenous Q6H  . pantoprazole (PROTONIX) IV  40 mg Intravenous Q24H  . sodium chloride flush  3 mL Intravenous Q12H  . thiamine injection  100 mg Intravenous Daily    Review of Systems: Unable to assess given patient's mental status. GENERAL:  Patient denies any pain.  Last temperature 99.3 at 4 am yesterday. PERFORMANCE STATUS (ECOG):  3-4 HEENT:  Denies any visual changes. Lungs:  No apparent shortness of breath or cough. Cardiac:  No apparent chest pain, orthopnea, or PND. GI:  Unable to eat.  No nausea, vomiting, diarrhea, constipation, melena or hematochezia. GU:  No apparent symptoms. Musculoskeletal:  No complaints of back or joint pain. Extremities:  Lower  extremity swelling has improved. Neuro:  Confused.  Delirious.  Denies headache, numbness or weakness.  He has not gotten out of bed. Endocrine:  No diabetes, thyroid issues, hot flashes or night sweats. Pain: Denies pain. Review of systems:  All other systems reviewed and found to be negative.   Physical Exam: Blood pressure 139/79, pulse (!) 118, temperature 98.6 F (37 C), temperature source Oral, resp. rate 18, height 6' (1.829 m), weight 257 lb 15 oz (117 kg), SpO2 94 %.  GENERAL:  Elderly gentleman lying comfortably on an air mattress in no acute distress. MENTAL STATUS:  Alert.  Confused.  Speaking nonsensically with intermittent clear thoughts.  HEAD:  Near alopecia.  Normocephalic, atraumatic, face symmetric, no Cushingoid features. EYES:  Blue eyes.  Pupils equal round and reactive to light and accomodation.  No conjunctivitis or scleral icterus. ENT:  Lips healed.  No oral lesions.  EXTREMITIES: Bilateral lower extremities markedly improved.  No palpable cords. NEUROLOGICAL: Follows some commands.  Moves all 4 extremities.  Few clear sentences among nonsensical words.   PSYCH:  Appropriate.    Results for orders placed or performed during the hospital encounter of 03/31/19 (from the past 48 hour(s))  Glucose, capillary     Status: None   Collection Time: 04/20/19  9:35 PM  Result Value Ref Range   Glucose-Capillary 84 70 - 99 mg/dL  Glucose, capillary     Status: None   Collection Time: 04/21/19  1:27 AM  Result Value Ref Range   Glucose-Capillary 93 70 - 99 mg/dL  Glucose, capillary     Status: None  Collection Time: 04/21/19  3:45 AM  Result Value Ref Range   Glucose-Capillary 84 70 - 99 mg/dL  Glucose, capillary     Status: None   Collection Time: 04/21/19  7:48 AM  Result Value Ref Range   Glucose-Capillary 98 70 - 99 mg/dL  Glucose, capillary     Status: Abnormal   Collection Time: 04/21/19 11:56 AM  Result Value Ref Range   Glucose-Capillary 109 (H) 70 - 99  mg/dL  Glucose, capillary     Status: None   Collection Time: 04/21/19  4:49 PM  Result Value Ref Range   Glucose-Capillary 93 70 - 99 mg/dL   Dg Chest Port 1 View  Result Date: 04/20/2019 CLINICAL DATA:  New onset of fever. History seizures. EXAM: PORTABLE CHEST 1 VIEW COMPARISON:  Radiographs 04/11/2019 and 02/20/2018. FINDINGS: 1338 hours. Overall pulmonary aeration has improved compared with the prior study. The heart size and mediastinal contours are stable. Mild atelectasis is present at both lung bases. There is no confluent airspace opacity, edema, pleural effusion or pneumothorax. The bones appear unchanged. IMPRESSION: Mild bibasilar atelectasis. No evidence of pneumonia or other acute process. Electronically Signed   By: Richardean Sale M.D.   On: 04/20/2019 13:47   Dg Hand Complete Right  Result Date: 04/21/2019 CLINICAL DATA:  Swelling, pain EXAM: RIGHT HAND - COMPLETE 3+ VIEW COMPARISON:  None. FINDINGS: No acute bony abnormality. Specifically, no fracture, subluxation, or dislocation. Early spurring in the MCP joints, particularly 2nd and 3rd MCP joints with probable erosions in the 2nd metacarpal head. Joint spaces are maintained. Soft tissues are intact. Calcifications in the triangular fibrocartilage. IMPRESSION: Arthritic changes as above.  No acute bony abnormality. Electronically Signed   By: Rolm Baptise M.D.   On: 04/21/2019 22:08    CBCs: 03/26/2019: Hematocrit 33.4.  Hemoglobin 10.7.  MCV 89.0.  Platelets 128,000, WBC 3700. 03/31/2019: Hematocrit 30.5.  Hemoglobin 10.0.  MCV 86.4.  Platelets 206,000.  WBC 3000 (ANC 1600). 04/01/2019: Hematocrit 26.0.  Hemoglobin 8.5.  MCV 85.2.  Platelets 165,000.  WBC 2900.  04/04/2019: Hematocrit 27.8.  Hemoglobin 9.2.  MCV 85.3.  Platelets 107,000.  WBC 2200 (ANC1500).  04/05/2019: Hematocrit 27.0.  Hemoglobin 8.9.  MCV 85.2.  Platelets 81,000.  WBC 1000 ([place] 200).       04/06/2019: Hematocrit 27.8.  Hemoglobin 9.0.  MCV 86.1.   Platelets 60,000.  WBC 1700 (ANC 1300).  04/07/2019: Hematocrit 27.6.  Hemoglobin 8.6.  MCV 90.2.  Platelets 55,000.  WBC 2300 (ANC 1400). 04/08/2019: Hematocrit 29.7.  Hemoglobin 9.2.  MCV 90.5.  Platelets 51,000.  WBC 1600 ([place] 700). 04/09/2019: Hematocrit 28.7.  Hemoglobin 9.3.  MCV 87.2.  Platelets 33,000.  WBC 1500 ([place] 400). 04/10/2019: Hematocrit 31.0.  Hemoglobin 9.7.  MCV 90.6.  Platelets 23,000.  WBC 1500 ([place] 200). 04/11/2019: Hematocrit 28.8.  Hemoglobin 9.1.  MCV 89.4.  Platelets 47,000.  WBC 1400 (ANC 100). 04/12/2019: Hematocrit 28.3.  Hemoglobin 8.9.  MCV 89.6.  Platelets 94,000.  WBC 1900 (ANC 100). 04/13/2019: Hematocrit 26.4.  Hemoglobin 8.6.  MCV 85.7.  Platelets 192,000.  WBC 3600 ([place] 800). 04/14/2019: Hematocrit 26.4.  Hemoglobin 8.5.  MCV 85.7.  Platelets 368,000.  WBC 16,900 ([place] 6,700). 04/15/2019: Hematocrit 25.7.  Hemoglobin 8.2.  MCV 87.4.  Platelets 502,000.  WBC 37,700 (ANC 16,100). 04/16/2019: Hematocrit 26.3.  Hemoglobin 8.5.  MCV 85.4.  Platelets 547,000.  WBC 39,200. 04/17/2019: Hematocrit 26.6.  Hemoglobin 8.7.  MCV 85.8.  Platelets 594,000.  WBC 27,000. 04/19/2019: Hematocrit 25.3.  Hemoglobin 8.4.  MCV 83.2.  Platelets 583,000.  WBC 21,500.   Assessment:  Jeremiah [name]. is a 78 y.o. male with rheumatoid arthritis who was admitted through the ER with bilateral lower extremity cellulitis unresponsive to outpatient management.  Cultures remain negative to date.  He was initially treated in the outpatient department with ciprofloxacin and clindamycin.  On admission he was switched to vancomycin and then doxycycline 04/03/2019.  He was rheumatoid arthritis and receives oral MTX weekly, Enbrel SQ weekly on Fridays, and Plaquenil.  Creatinine was 3.12 on admission and is currently 1.10.  Peripheral smear reveals absolute leukopenia with neutropenia and ANC of 400. RBC are normochromic, normocytic anemia with mild anisocytosis. There is no significant  polychromasia, and low reticulocyte count, indicating RBC hypoproliferation. There is no increase in schistocytes, less than 0.5% of RBCs.  There is thrombocytopenia, with unremarkable morphology. Etiology is likely due to decreased marrow production is favored.   He has folic acid deficiency.  Folic acid was 3.4 on 123456.  Per patient's report, he stopped folic acid about 1 month ago.  He is on folate.  Bone marrow on 04/10/2019 revealed a hypercellular marrow with erythroid hyperplasia, rare ringed sideroblasts and dyspoiesis.  There was polytic plasmacytosis (6-10%).  There were no increased blasts.  Cytogenetics were normal (40, XX).  NGS is pending.  He was on GCSF (Granix) from 04/11/2019 - 04/14/2019.  Symptomatically, he is confused/delirious.  Counts have recovered.  Plan: 1.   Pancytopenia  Counts have recovered except for anemia.  Reviewed bone marrow with pathologist in conjunction with patient's history.  Bone marrow findings likely related to methotrexate or possibly a myelodysplastic syndrome (MDS).   Recovery of counts (WBC and platelets) speaks against MDS.   If patient has MDS, he would have a low grade disorder and not high grade disorder.    Life expectancy would be in years (5-9) and should NOT affect current prognosis.    Await NGS.  If patient needs treatment for MDS in the future, it would likely only entail an ESA.  Patient has documented folic acid deficiency.   Recheck folate on supplementation.  Elevated WBC likely due to reactive leukocytosis.   Patient off GCSF x 7 days.  Elevated platelet count likely due to reactive thrombocytosis   Patient has anemia.  Doubt a myeloproliferative disorder.   Patient on heparin SQ for DVT prophylaxis.  2.   Bilateral lower extremity cellulitis  Resolved.  Patient had severe lymphedema/venous edema felt mimicking cellulitis. 3.   Fever and neutropenia  Resolved.  Patient off antibiotics.  Temperature 101.6  (axillary) on 11/30 at 11:22.   Temperature 99.3 (axillary) on 11/30 at 20:04.  Temperature 99.3 (oral) on 12/01 at 04:41.  Check UA and culture (ordered).  4.   Altered mental status  Appreciate neurology consultation.  Spoke with Dr Doy Mince.  Head MRI negative.  EEG with slowing.  TSH, B12, B1, and ammonia were normal.  Patient has baseline dementia and decline prior to hospitalization with overlying metabolic encephalopathy.  Patient currently unable to eat safely.    If continues, decision will need to be made regarding possible feeding tube.   Discussed at length with patient's wife.   She would like to discuss further with Dr Regenia Skeeter.  Check urinalysis and culture.  Recommend continued supportive care.   A total of > 30 minutes of face-to-face time was spent with the patient and his wife with greater than 50% of that time in counseling  and care-coordination.    Lequita Asal, MD  04/22/2019, 2:15 AM

## 2019-04-22 NOTE — NC FL2 (Signed)
[place] LEVEL OF CARE SCREENING TOOL     IDENTIFICATION  Patient Name: Jeremiah [name]. Birthdate: Sep 27, 1940 Sex: male Admission Date (Current Location): 03/31/2019  [place] and [place] Number:  Engineering geologist and Address:  [facility], [address], [place], [place] [address]      Provider Number: [id number]  Attending Physician Name and Address:  [name], MD  Relative Name and Phone Number:       Current Level of Care: Hospital Recommended Level of Care: [place] Prior Approval Number:    Date Approved/Denied:   PASRR Number: [id number]  Discharge Plan: ICF    Current Diagnoses: Patient Active Problem List   Diagnosis Date Noted  . Oral mucositis 04/14/2019  . Neutropenia with fever ([place]) 04/11/2019  . Acute metabolic encephalopathy [id number]  . Pancytopenia ([place])   . Cellulitis of lower extremity 04/08/2019  . Folic acid deficiency   . AKI (acute kidney injury) ([place])   . Pressure injury of skin 04/05/2019  . Other pancytopenia ([place])   . Cellulitis 03/31/2019  . Edema of both legs 08/19/2018  . History of allergy to latex 07/22/2018  . Lymphedema 06/16/2018  . Chronic venous insufficiency 06/16/2018  . Venous ulcer of leg ([place]) 06/16/2018  . PAD (peripheral artery disease) ([place]) 06/16/2018  . Diabetes mellitus type 2, uncomplicated ([place]) 0000000  . 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 Obesity (BMI 30.0-34.9) 02/27/2018  . Primary osteoarthritis of left knee 02/27/2018  . Urinary retention 02/25/2018  . Rheumatoid arthritis involving multiple sites ([place]) 02/25/2018  . Left knee pain 02/20/2018  . Neurogenic pain 10/02/2017  . Spondylosis without myelopathy or radiculopathy, lumbosacral region 07/17/2017  . Trigger point with back pain (recurrent/intermittent) 07/17/2017  . Chronic musculoskeletal pain 02/27/2017  . Myofascial pain syndrome 02/27/2017  . Long  term current use of anticoagulant (Plaquenil) 02/27/2017  . Chronic lumbar radiculitis (L5 dermatome) (Right) 02/27/2017  . Chronic lumbar radiculitis (L3 dermatome) (Left) 02/27/2017  . Chronic foot pain (Right) 02/27/2017  . Chronic thoracic back pain (Bilateral) (R>L) 02/27/2017  . Chronic neck pain (posterior) (Bilateral) (R>L) 02/27/2017  . Chronic shoulder pain (Bilateral) (L>R) 02/27/2017  . 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 DDD (degenerative disc disease), lumbar 02/19/2017  . 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 Chronic knee pain (Bilateral) (R>L) 02/19/2017  . 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 Grade 1 Retrolisthesis of L1 over L2 02/19/2017  . T11 wedge compression fracture (Aprox. 1993) 02/19/2017  . Lumbar facet arthropathy 02/19/2017  . Lumbar facet syndrome (Bilateral) 02/19/2017  . Lumbar foraminal stenosis (Bilateral: L1-2)  (Right: L3-4, L4-5) 02/19/2017  . 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 Disorder of skeletal system 02/19/2017  . Pharmacologic therapy 02/19/2017  . Problems influencing health status 02/19/2017  . Chronic pain syndrome 01/14/2017  . Gastroesophageal reflux disease 01/14/2017  . Hyperlipidemia 01/14/2017  . Lumbar radiculopathy 01/14/2017  . Postoperative anemia due to acute blood loss 01/14/2017  . Postprocedural hypotension 01/14/2017  . Chronic lower extremity pain (Secondary Area of Pain) (Bilateral) (R>L) 01/14/2017  . Rheumatoid arthritis ([place]) 12/17/2013  . Diabetes ([place]) 12/17/2013  . Benign essential hypertension 11/13/2013  . Chronic low back pain (Primary Area of Pain) (Bilateral) (R>L) 11/13/2013  . Prostatitis 11/13/2013  . Seizure ([place]) 11/13/2013  . Left bundle branch block 05/21/2013  . Dorsalgia 05/21/2013    Orientation RESPIRATION BLADDER  Height & Weight     Self, Time, Situation  Normal Continent Weight: 117 kg Height:  6' (182.9 cm)   BEHAVIORAL SYMPTOMS/MOOD NEUROLOGICAL BOWEL NUTRITION STATUS  (none) (none) Continent Diet  AMBULATORY STATUS COMMUNICATION OF NEEDS Skin   Extensive Assist Verbally Normal                       Personal Care Assistance Level of Assistance  Bathing, Feeding, Dressing Bathing Assistance: Limited assistance Feeding assistance: Limited assistance Dressing Assistance: Limited assistance     Functional Limitations Info  Sight, Hearing, Speech Sight Info: Adequate Hearing Info: Adequate Speech Info: Adequate    SPECIAL CARE FACTORS FREQUENCY  PT (By licensed PT), OT (By licensed OT)     PT Frequency: 5 OT Frequency: 5            Contractures Contractures Info: Not present    Additional Factors Info  Code Status, Allergies Code Status Info: Full Code Allergies Info: Cefoperazone, Penicillins, Latex           Current Medications (04/22/2019):  This is the current hospital active medication list Current Facility-Administered Medications  Medication Dose Route Frequency Provider Last Rate Last Dose  . 0.9 % NaCl with KCl 20 mEq/ L  infusion   Intravenous Continuous Dallie Piles, RPH 75 mL/hr at 04/22/19 1209    . antiseptic oral rinse (BIOTENE) solution 15 mL  15 mL Topical PRN Pickenpack-Cousar, Athena N, NP      . feeding supplement (NEPRO CARB STEADY) liquid 237 mL  237 mL Oral BID BM Pickenpack-Cousar, Athena N, NP   237 mL at 04/22/19 1101  . folic acid injection 1 mg  1 mg Intravenous Daily Pickenpack-Cousar, Athena N, NP   1 mg at 04/22/19 1100  . ketorolac (TORADOL) 15 MG/ML injection 15 mg  15 mg Intravenous Q6H PRN Lang Snow, NP   15 mg at 04/22/19 1415  . lacosamide (VIMPAT) 100 mg in sodium chloride 0.9 % 25 mL IVPB  100 mg Intravenous Q12H Samuella Cota, MD 70 mL/hr at 04/22/19 1108 100 mg at 04/22/19 1108  . lip balm (BLISTEX) ointment   Topical PRN Spongberg, Audie Pinto, MD      . magic mouthwash w/lidocaine  5 mL Oral TID PRN  Ivor Costa, MD      . metoprolol tartrate (LOPRESSOR) injection 2.5 mg  2.5 mg Intravenous Q6H Pickenpack-Cousar, Athena N, NP   2.5 mg at 04/22/19 1101  . ondansetron (ZOFRAN) injection 4 mg  4 mg Intravenous Q6H PRN Lavina Hamman, MD      . pantoprazole (PROTONIX) injection 40 mg  40 mg Intravenous Q24H Pickenpack-Cousar, Athena N, NP   40 mg at 04/22/19 1100  . polyvinyl alcohol (LIQUIFILM TEARS) 1.4 % ophthalmic solution 1 drop  1 drop Both Eyes QID PRN Pickenpack-Cousar, Athena N, NP      . sodium chloride flush (NS) 0.9 % injection 3 mL  3 mL Intravenous Q12H Lavina Hamman, MD   3 mL at 04/22/19 1102  . thiamine (B-1) injection 100 mg  100 mg Intravenous Daily Pickenpack-Cousar, Athena N, NP   100 mg at 04/22/19 1103     Discharge Medications: Please see discharge summary for a list of discharge medications.  Relevant Imaging Results:  Relevant Lab Results:   Additional Information SSN [id number]  Trecia Rogers, LCSW

## 2019-04-22 NOTE — [place] Notes (Signed)
Nutrition Follow Up Note   DOCUMENTATION CODES:   Not applicable  INTERVENTION:   If PEG tube able to be placed, recommend:  Osmolite 1.5 @ goal rate of 32m/hr- Initiate at 320mhr and increase by 1569mr q 12 hours until goal rate is reached  Prostat liquid protein 30 ml BID via tube, each supplement provides 100 kcal, 15 grams protein.  Free water flushes 150m35m hrs   Regimen provides 2360kcal/day, 120g/day protein, 1997ml84m free water  Pt at high refeed risk; recommend monitor K, Mg and P labs daily after tube feed initiation.   NUTRITION DIAGNOSIS:   Increased nutrient needs related to wound healing as evidenced by increased estimated needs.  GOAL:   Patient will meet greater than or equal to 90% of their needs -not met   MONITOR:   PO intake, Supplement acceptance, Labs, Weight trends, Skin, I & O's  ASSESSMENT:   78 y/92male with a history of HTN, seizures and RA on immunosuppressant therapy who presented on 11/10 with complaints of worsening cellulitis.   Pt continues to have poor appetite and oral intake; pt eating <25% of meals and holding food in his mouth. Family has decided to have PEG tube placed; GI consult pending. Pt is at high refeed risk.   Medications reviewed and include: folic acid, protonix, B1, NaCl w/ KCl _0 /hr  Labs reviewed: K 3.1(L) Folate 11.5 Wbc- 25.8(H), Hgb 8.1(L), Hct 23.3(L)  Diet Order:   Diet Order            DIET - DYS 1 Room service appropriate? Yes with Assist; Fluid consistency: Nectar Thick  Diet effective now        Diet - low sodium heart healthy             EDUCATION NEEDS:   Education needs have been addressed  Skin:  Skin Assessment: Reviewed RN Assessment(ecchymosis, Scrotum: 0.3cm x 0.3cm x 0.1cm, Right inner thigh: 0.5cm x 0.3cm x 0.1cm)  Last BM:  12/1- type 4  Height:   Ht Readings from Last 1 Encounters:  04/12/19 6' (1.829 m)    Weight:   Wt Readings from Last 1 Encounters:  04/20/19  117 kg    Ideal Body Weight:  80.9 kg  BMI:  Body mass index is 34.98 kg/m.  Estimated Nutritional Needs:   Kcal:  2300-2600kcal/day  Protein:  115-130g/day  Fluid:  >2L/day  CaseyKoleen DistanceRD, LDN Pager #- [phone]#- [phone] Hours Pager: [phone]

## 2019-04-22 NOTE — [place] Notes (Addendum)
    BRIEF OVERNIGHT PROGRESS REPORT  SUBJECTIVE: Patient c/o right hand swelling, warmth and pain. Pain is worse with movement/repositiong. No reports of associated symptoms.  OBJECTIVE: On arrival to the ED, he was afebrile with blood pressure 224/114 mm Hg and pulse rate 81 beats/min. There were no focal neurological deficits; he was alert and oriented to person only. On physical exam of his right hand, he is noted to have swelling, warmth to touch with decreased range of motion. He is unable to grip or flex or extend his wrist without significant pain. See below.      ASSESSMENT: 78 y.o male with Past medical history of rheumatoid arthritis on immunosuppressive therapy, seizures, HTN, chronic lymphedema presenting with complaints of worsening leg infection. Recent  Evaluation at [facility] ED for cellulitis of the leg that was treated with oral Cipro and Clindamycin  PLAN:  1. Right hand swelling - Patient with hx of RA treated with immunosuppressive. - I suspect that this is his RA flare up, low suspicion for cellulitis given appearance? - His immunosuppressive (Methotrexate, Enbrel, Plaquenil) are being held, may consider restarting as appropriate per treatment team - To rule out other etiologies I obtained X-ray of the hand which showed arthritic changes particularly 2nd and 3rd MCP joints with probable erosions in the 2nd metacarpal head. - He received small dose of Toradol for pain with improvement    Rufina Falco, DNP, CCRN, FNP-C [facility] Nurse Practitioner Between 7pm to [place] - Pager [phone]  After 7am go to [email or web address] - [email or web address] select [facility]  [facility] [employer]  [phone]

## 2019-04-22 NOTE — Progress Notes (Signed)
Subjective: Patient continues to be awake, alert and conversant.    Objective: Current vital signs: BP 121/66 (BP Location: Left Arm)   Pulse (!) 102   Temp 98.2 F (36.8 C) (Oral)   Resp 18   Ht 6' (1.829 m)   Wt 117 kg   SpO2 100%   BMI 34.98 kg/m  Vital signs in last 24 hours: Temp:  [97.8 F (36.6 C)-98.6 F (37 C)] 98.2 F (36.8 C) (12/02 0450) Pulse Rate:  [98-118] 102 (12/02 0450) Resp:  [18] 18 (12/02 0450) BP: (121-139)/(66-79) 121/66 (12/02 0450) SpO2:  [94 %-100 %] 100 % (12/02 0450)  Intake/Output from previous day: 12/01 0701 - 12/02 0700 In: 720.3 [P.O.:50; I.V.:600.3; IV Piggyback:70] Out: -  Intake/Output this shift: No intake/output data recorded. Nutritional status:  Diet Order            DIET - DYS 1 Room service appropriate? Yes with Assist; Fluid consistency: Nectar Thick  Diet effective now        Diet - low sodium heart healthy              Neurologic Exam: Mental Status: Alert and awake today.  Knows he is in the hospital.  Speech fluent.  Follows commands. Cranial Nerves: II: Visual fields grossly normal, pupils equal, round, reactive to light and accommodation III,IV, VI: ptosis not present, extra-ocular motions intact bilaterally V,VII: smile symmetric, facial light touch sensation normal bilaterally VIII: hearing normal bilaterally IX,X: gag reflex present XI: bilateral shoulder shrug XII: midline tongue extension Motor: Moves all extremities, upper extremities more than lower extremities with continued BLE and right hand swelling Sensory: Pinprick and light touch intact throughout, bilaterally  Lab Results: Basic Metabolic Panel: Recent Labs  Lab 04/16/19 0540 04/17/19 0849 04/19/19 0456 04/22/19 0415  NA 142 139 138 142  K 3.6 4.0 3.7 3.1*  CL 112* 106 105 112*  CO2 '24 25 23 23  '$ GLUCOSE 89 94 100* 87  BUN 8 7* 8 11  CREATININE 0.89 0.76 0.92 0.95  CALCIUM 8.0* 8.2* 8.4* 8.2*  MG  --   --   --  2.0    Liver  Function Tests: No results for input(s): AST, ALT, ALKPHOS, BILITOT, PROT, ALBUMIN in the last 168 hours. No results for input(s): LIPASE, AMYLASE in the last 168 hours. No results for input(s): AMMONIA in the last 168 hours.  CBC: Recent Labs  Lab 04/16/19 0540 04/17/19 0849 04/19/19 0456 04/22/19 0415  WBC 39.2* 27.0* 21.5* 25.8*  NEUTROABS  --   --   --  17.3*  HGB 8.5* 8.7* 8.4* 8.1*  HCT 26.3* 26.6* 25.3* 23.3*  MCV 85.4 85.8 83.2 80.6  PLT 547* 594* 583* 472*    Cardiac Enzymes: No results for input(s): CKTOTAL, CKMB, CKMBINDEX, TROPONINI in the last 168 hours.  Lipid Panel: No results for input(s): CHOL, TRIG, HDL, CHOLHDL, VLDL, LDLCALC in the last 168 hours.  CBG: Recent Labs  Lab 04/21/19 0345 04/21/19 0748 04/21/19 1156 04/21/19 1649 04/21/19 2354  GLUCAP 84 98 109* 93 90    Microbiology: Results for orders placed or performed during the hospital encounter of 03/31/19  Culture, blood (routine x 2)     Status: None   Collection Time: 03/31/19  2:40 PM   Specimen: BLOOD  Result Value Ref Range Status   Specimen Description BLOOD LEFT ANTECUBITAL  Final   Special Requests   Final    BOTTLES DRAWN AEROBIC AND ANAEROBIC Blood Culture adequate volume  Culture   Final    NO GROWTH 11 DAYS Performed at [facility], [address] [place], [place] [address]    Report Status 04/11/2019 FINAL  Final  Culture, blood (routine x 2)     Status: None   Collection Time: 03/31/19  2:40 PM   Specimen: BLOOD  Result Value Ref Range Status   Specimen Description BLOOD BLOOD LEFT HAND  Final   Special Requests   Final    BOTTLES DRAWN AEROBIC AND ANAEROBIC Blood Culture adequate volume   Culture   Final    NO GROWTH 11 DAYS Performed at [facility], [address], [place], [place] [address]    Report Status 04/11/2019 FINAL  Final  SARS CORONAVIRUS 2 (TAT 6-24 HRS) Nasopharyngeal Nasopharyngeal Swab     Status: None   Collection Time:  03/31/19  2:51 PM   Specimen: Nasopharyngeal Swab  Result Value Ref Range Status   SARS Coronavirus 2 NEGATIVE NEGATIVE Final    Comment: (NOTE) SARS-CoV-2 target nucleic acids are NOT DETECTED. The SARS-CoV-2 RNA is generally detectable in upper and lower respiratory specimens during the acute phase of infection. Negative results do not preclude SARS-CoV-2 infection, do not rule out co-infections with other pathogens, and should not be used as the sole basis for treatment or other patient management decisions. Negative results must be combined with clinical observations, patient history, and epidemiological information. The expected result is Negative. Fact Sheet for Patients: SugarRoll.be Fact Sheet for Healthcare Providers: https://www.woods-mathews.com/ This test is not yet approved or cleared by the [place] FDA and  has been authorized for detection and/or diagnosis of SARS-CoV-2 by FDA under an Emergency Use Authorization (EUA). This EUA will remain  in effect (meaning this test can be used) for the duration of the COVID-19 declaration under Section 56 4(b)(1) of the Act, 21 U.S.C. section 360bbb-3(b)(1), unless the authorization is terminated or revoked sooner. Performed at [facility], [place] [address] [place], [place] [address]   Culture, blood (x 2)     Status: None   Collection Time: 04/05/19 11:17 AM   Specimen: BLOOD  Result Value Ref Range Status   Specimen Description BLOOD LEFT ANTECUBITAL  Final   Special Requests   Final    BOTTLES DRAWN AEROBIC AND ANAEROBIC Blood Culture adequate volume   Culture   Final    NO GROWTH 5 DAYS Performed at [facility], [address], [place], [place] [address]    Report Status 04/10/2019 FINAL  Final  Culture, blood (x 2)     Status: None   Collection Time: 04/05/19 11:25 AM   Specimen: BLOOD  Result Value Ref Range Status   Specimen Description BLOOD RIGHT  ANTECUBITAL  Final   Special Requests   Final    BOTTLES DRAWN AEROBIC AND ANAEROBIC Blood Culture adequate volume   Culture   Final    NO GROWTH 5 DAYS Performed at [facility], [address] [place], [place] [address]    Report Status 04/10/2019 FINAL  Final  SARS CORONAVIRUS 2 (TAT 6-24 HRS) Nasopharyngeal Nasopharyngeal Swab     Status: None   Collection Time: 04/08/19  2:04 PM   Specimen: Nasopharyngeal Swab  Result Value Ref Range Status   SARS Coronavirus 2 NEGATIVE NEGATIVE Final    Comment: (NOTE) SARS-CoV-2 target nucleic acids are NOT DETECTED. The SARS-CoV-2 RNA is generally detectable in upper and lower respiratory specimens during the acute phase of infection. Negative results  do not preclude SARS-CoV-2 infection, do not rule out co-infections with other pathogens, and should not be used as the sole basis for treatment or other patient management decisions. Negative results must be combined with clinical observations, patient history, and epidemiological information. The expected result is Negative. Fact Sheet for Patients: SugarRoll.be Fact Sheet for Healthcare Providers: https://www.woods-mathews.com/ This test is not yet approved or cleared by the [place] FDA and  has been authorized for detection and/or diagnosis of SARS-CoV-2 by FDA under an Emergency Use Authorization (EUA). This EUA will remain  in effect (meaning this test can be used) for the duration of the COVID-19 declaration under Section 56 4(b)(1) of the Act, 21 U.S.C. section 360bbb-3(b)(1), unless the authorization is terminated or revoked sooner. Performed at [facility], [place] [address], [place], [place] [address]   CULTURE, BLOOD (ROUTINE X 2) w Reflex to ID Panel     Status: None   Collection Time: 04/10/19 11:55 PM   Specimen: BLOOD  Result Value Ref Range Status   Specimen Description BLOOD RIGHT ANTECUBITAL  Final    Special Requests   Final    BOTTLES DRAWN AEROBIC AND ANAEROBIC Blood Culture adequate volume   Culture   Final    NO GROWTH 5 DAYS Performed at [facility], [address] [place], [place] [address]    Report Status 04/16/2019 FINAL  Final  CULTURE, BLOOD (ROUTINE X 2) w Reflex to ID Panel     Status: None   Collection Time: 04/10/19 11:59 PM   Specimen: BLOOD  Result Value Ref Range Status   Specimen Description BLOOD RIGHT ARM  Final   Special Requests   Final    BOTTLES DRAWN AEROBIC AND ANAEROBIC Blood Culture adequate volume   Culture   Final    NO GROWTH 5 DAYS Performed at [facility], [address], [place], [place] [address]    Report Status 04/16/2019 FINAL  Final  Urine Culture     Status: None   Collection Time: 04/11/19  2:00 AM   Specimen: Urine, Random  Result Value Ref Range Status   Specimen Description   Final    URINE, RANDOM Performed at [facility], [address] [place], [place] [address]    Special Requests   Final    NONE Performed at [facility], [address], [place], [place] [address]    Culture   Final    NO GROWTH Performed at [facility], [place] [address] [place], [place] [address]    Report Status 04/12/2019 FINAL  Final    Coagulation Studies: No results for input(s): LABPROT, INR in the last 72 hours.  Imaging: Dg Chest Port 1 View  Result Date: 04/20/2019 CLINICAL DATA:  New onset of fever. History seizures. EXAM: PORTABLE CHEST 1 VIEW COMPARISON:  Radiographs 04/11/2019 and 02/20/2018. FINDINGS: 1338 hours. Overall pulmonary aeration has improved compared with the prior study. The heart size and mediastinal contours are stable. Mild atelectasis is present at both lung bases. There is no confluent airspace opacity, edema, pleural effusion or pneumothorax. The bones appear unchanged. IMPRESSION: Mild bibasilar atelectasis. No evidence of pneumonia or other acute process.  Electronically Signed   By: Richardean Sale M.D.   On: 04/20/2019 13:47   Dg Hand Complete Right  Result Date: 04/21/2019 CLINICAL DATA:  Swelling, pain EXAM: RIGHT HAND - COMPLETE 3+ VIEW COMPARISON:  None. FINDINGS: No acute bony abnormality. Specifically, no fracture, subluxation, or dislocation.  Early spurring in the MCP joints, particularly 2nd and 3rd MCP joints with probable erosions in the 2nd metacarpal head. Joint spaces are maintained. Soft tissues are intact. Calcifications in the triangular fibrocartilage. IMPRESSION: Arthritic changes as above.  No acute bony abnormality. Electronically Signed   By: Rolm Baptise M.D.   On: 04/21/2019 22:08    Medications:  I have reviewed the patient's current medications. Scheduled: . feeding supplement (NEPRO CARB STEADY)  237 mL Oral BID BM  . folic acid  1 mg Intravenous Daily  . metoprolol tartrate  2.5 mg Intravenous Q6H  . pantoprazole (PROTONIX) IV  40 mg Intravenous Q24H  . sodium chloride flush  3 mL Intravenous Q12H  . thiamine injection  100 mg Intravenous Daily    Assessment/Plan: 78 y.o. male with a history of HTN, seizures and RA on immunosuppressant therapy whose family reports complaints of worsening mental status, present for at least the past year and worsening with recent illness and hospitalization. Patient with a family history of dementia.MRI of the brain performed and shows no acute changes. EEG only significant for slowing. TSH, B12, ESR, B1 and serum ammonia are unremarkable. Patient with cognitive decline in setting of dementia and progressive cognitive declines complicated by metabolic encephalopathy. As lab work improves cognitive function may improve somewhat but will lag behind improvement in lab work and will be complicated by continued hospitalization. Patient remains awake and alert.  On Vimpat.    Family conference had today with wife and attending, Dr. Posey Pronto.  Prognosis for dementia discussed and family  wishes for patient to have DNR lifted and PEG to be placed.    Recommendations: 1. Continue supplementation of folate and thiamine 2. Continue Vimpat at current dose.  Once PEG in place may return patient to previous AED regimen.   3. PT/OT   LOS: 21 days   Jeremiah Goodell, MD Neurology [phone] 04/22/2019  11:35 AM

## 2019-04-22 NOTE — Progress Notes (Signed)
PALLIATIVE NOTE:  Patient asleep but easily aroused. Waves when I enters the room. Much more alert and interactive today versus yesterday and previous days. Able to engage in conversation. Recognizes his wife who is at the bedside. Denies pain. States "I am tired, yall talk I am going to sleep!"   Patient originally placed on comfort care at wife's request but later yesterday afternoon after speaking with medical provider chose to rescind comfort and requested full scope/aggressive care with consideration of PEG placement.   Appetite remains poor. Wife is at the bedside. She is pleasant in conversation and reaches for my hand stating she was upset yesterday knowing she could potentially lose her husband. She is now a little more hopeful knowing his prognosis is longer and he could potentially improve. She is apologetic stating "I didn't mean to cause confusion, I don't want to lose him and if he still has time I want him here!" Therapeutic listening and support given. She shares receiving updates today and speaking with attending providers. She verbalized awareness of Mr. Segovia condition and expressed wishes for full scope care and that she and her sons wish to proceed with PEG placement allowing him every opportunity to live if possible. Support given acknowledging her wishes and difficult decisions.   Mrs. Damme expresses she has to at least try and if he continues not to do well she will then know she has given her all. She mentions she does not want him to suffer during the process and does not think he is. Support given. She is relieved to know that she has spoken to Oncology and she and family have a better understanding of all that is going on with him from their standpoint. She is hopeful with placement of PEG this will increase his quality of life and he possibly could eat more by mouth at some point. She reports he was able to take in a few sips today and a few bites compared to the past few  days. She reports he did not have similar episodes as he has had but acknowledges each day may be different. She again remains hopeful for improvement with similar hopes and support from her children.   Support provided and Mrs. Aiken verbalizes appreciation of support and medical team's efforts in caring for her husband. She is requesting for me to stop by and check in on him and her if I can regardless of no direct needs from palliative at this time. Support given and assured her I would stop by to check in with them and she can feel free to call at if any needs arise.   Assessment -more awake, alert today, NAD, chronically-ill  -RRR -CTA bilaterally -will follow commands, alert and oriented to self, wife.   Plan -Continue with full code/full scope of care per medical team -PEG tube  -Wife is hopeful for SNF placement with plans to eventually return home with family. Taking it one day at a time providing patient every opportunity to improve.  -PMT will follow from a distance, re-engage if need arise.   Total Time: 20 min.   Greater than 50%  of this time was spent counseling and coordinating care related to the above assessment and plan.  Alda Lea, AGPCNP-BC Palliative Medicine Team

## 2019-04-22 NOTE — Progress Notes (Signed)
[facility] [place] NAME: Jeremiah [name]    MR#:  [id number]  DATE OF BIRTH:  10-Dec-1940  SUBJECTIVE:  patient's wife is in the room. He is joking around with his wife about 36 wedding anniversary diamond ring. He feels his mouth is dry and blunt something to drink.  Saw patient this morning by Dr. Doy Mince. Wife says she was stressed out all day yesterday. She wants to receive DNR status and wants to try feeding tube be placed and see how patient does.   REVIEW OF SYSTEMS:   Review of Systems  Unable to perform ROS: Medical condition   Tolerating Diet: on dyshpagia 1 pureed diet--not much Tolerating PT: pending  DRUG ALLERGIES:   Allergies  Allergen Reactions  . Cefoperazone Anaphylaxis  . Penicillins Anaphylaxis and Other (See Comments)    Has patient had a PCN reaction causing immediate rash, facial/tongue/throat swelling, SOB or lightheadedness with hypotension: Yes Has patient had a PCN reaction causing severe rash involving mucus membranes or skin necrosis: No Has patient had a PCN reaction that required hospitalization No Has patient had a PCN reaction occurring within the last 10 years: No If all of the above answers are "NO", then may proceed with Cephalosporin use.  . Latex Rash    VITALS:  Blood pressure 121/66, pulse (!) 102, temperature 98.2 F (36.8 C), temperature source Oral, resp. rate 18, height 6' (1.829 m), weight 117 kg, SpO2 100 %.  PHYSICAL EXAMINATION:   Physical Exam  GENERAL:  78 y.o.-year-old patient lying in the bed with no acute distress. Appears calm more awake  HEENT: Head atraumatic, normocephalic. Oropharynx and nasopharynx clear. Mucosa dry NECK:  Supple, no jugular venous distention. No thyroid enlargement, no tenderness.  LUNGS: Normal breath sounds bilaterally, no wheezing, rales, rhonchi. No use of accessory muscles of respiration.  CARDIOVASCULAR: S1, S2 normal. No murmurs, rubs, or  gallops.  ABDOMEN: Soft, nontender, nondistended. Bowel sounds present. No organomegaly or mass.  EXTREMITIES: No cyanosis, clubbing  -chronic venous stasis with one plus edema b/l.    NEUROLOGIC: grolssly nonfocal moves all extremities  PSYCHIATRIC:  patient is alert and calm--talking some  LABORATORY PANEL:  CBC Recent Labs  Lab 04/22/19 0415  WBC 25.8*  HGB 8.1*  HCT 23.3*  PLT 472*    Chemistries  Recent Labs  Lab 04/22/19 0415  NA 142  K 3.1*  CL 112*  CO2 23  GLUCOSE 87  BUN 11  CREATININE 0.95  CALCIUM 8.2*   Cardiac Enzymes No results for input(s): TROPONINI in the last 168 hours. RADIOLOGY:  Dg Chest Port 1 View  Result Date: 04/20/2019 CLINICAL DATA:  New onset of fever. History seizures. EXAM: PORTABLE CHEST 1 VIEW COMPARISON:  Radiographs 04/11/2019 and 02/20/2018. FINDINGS: 1338 hours. Overall pulmonary aeration has improved compared with the prior study. The heart size and mediastinal contours are stable. Mild atelectasis is present at both lung bases. There is no confluent airspace opacity, edema, pleural effusion or pneumothorax. The bones appear unchanged. IMPRESSION: Mild bibasilar atelectasis. No evidence of pneumonia or other acute process. Electronically Signed   By: Richardean Sale M.D.   On: 04/20/2019 13:47   Dg Hand Complete Right  Result Date: 04/21/2019 CLINICAL DATA:  Swelling, pain EXAM: RIGHT HAND - COMPLETE 3+ VIEW COMPARISON:  None. FINDINGS: No acute bony abnormality. Specifically, no fracture, subluxation, or dislocation. Early spurring in the MCP joints, particularly 2nd and 3rd MCP joints with probable erosions  in the 2nd metacarpal head. Joint spaces are maintained. Soft tissues are intact. Calcifications in the triangular fibrocartilage. IMPRESSION: Arthritic changes as above.  No acute bony abnormality. Electronically Signed   By: Rolm Baptise M.D.   On: 04/21/2019 22:08   ASSESSMENT AND PLAN:  78 year old man PMH including chronic  lymphedema, rheumatoid arthritis, seizure disorder, presented with worsening leg infection.  Treated in [facility] ED for cellulitis 11/5, discharged on Cipro and clindamycin.  Admitted for bilateral lower extremity cellulitis, failed outpatient therapy with acute kidney injury.  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  WBC and platelet counts have recovered. --followed by Dr. Mike Gip, she feels this is likely related to methotrexate and not myelodysplastic disorder,her opinion is that the prognosis would be 5 to 10 years. -Counts remain stable.  Acute metabolic encephalopathy with intermittent confusion, most likely delirium, superimposed on cognitive decline in the setting of dementia.  CT head was limited but showed no acute abnormalities.  Seen by neurology.  MRI brain was unremarkable.  EEG showed no evidence of seizure.  On minimal medications, no sedating medications.  Discussed with Dr. Doy Mince in patient's room with wife    Discussed with patient's wife. Patient appears more alert today. They want to recede DNR change to full code and after discussing with her two sons they wish to proceed with peg tube feeding.  Dysphagia. --Dysphagia 1 diet, nectar thick liquid recommended 12/31 -family wants to give a trial of peg tube feeding. Will get  It placed. Dr. Alice Reichert from [email or web address] has been consulted  Anemia. --Has remained stable.   -Continue folic acid supplementation per hematology.  Leukocytosis secondary to Granix. --Trending down appropriately.    Rheumatoid arthritis on immunosuppressive therapy (methotrexate) --Medications remain on hold  Seizure disorder --No recurrent seizures.  Lamictal stopped given n.p.o. status.  Discussed with neurology, substituted with IV Vimpat.  Neuropathy --Gabapentin discontinued given  encephalopathy.  Chronic lymphedema --No further evaluation suggested.  Pressure ulcers present on admission buttock and sacrum, stage II sacral wound. -- Barrier cream twice daily and PRN soilage.   Patient will need to be discharged to rehab after feeding tube was placed. Wife is in agreement with it. Will get social worker involved. PT consultation placed   Family communication : with wife in the room Consults : hematology, ID, neurology Discharge Disposition :SNF CODE STATUS: FULL-- wife rescinded DNR status on April 22, 2019 DVT Prophylaxis : heparin held for peg tube feeding placement  TOTAL TIME TAKING CARE OF THIS PATIENT: *30* minutes.  >50% time spent on counselling and coordination of care  POSSIBLE D/C IN ** few* DAYS, DEPENDING ON CLINICAL CONDITION.  Note: This dictation was prepared with Dragon dictation along with smaller phrase technology. Any transcriptional errors that result from this process are unintentional.  [name] M.D on 04/22/2019 at 10:52 AM  Between 7am to 6pm - Pager - [phone]  After 6pm go to [email or web address]  [facility]   CC: Primary care physician; Derinda Late, MDPatient ID: Jeremiah Royals., male   DOB: Jun 23, 1940, 78 y.o.   MRN: [id number]

## 2019-04-22 NOTE — Consult Note (Signed)
GI Inpatient Consult Note  Reason for Consult: PEG Tube   Attending Requesting Consult: Dr. [name], MD  History of Present Illness: Jeremiah [name]. is a 78 y.o. male seen for evaluation of PEG tube placement at the request of Dr. [name]. 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 Patient was admitted through the ER with bilateral lower extremity cellulitis. 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 GI is consulted in the context of possible PEG tube placement. He was evaluated by speech 11/30 where he demonstrated complete oral holding and liquid bolus had to be removed from the mouth. His swallowing improved and he was seen by speech again 12/1 where he was placed on Dysphagia level 1 diet (puree) with nectar consistency liquids with strict aspiration precautions. Palliative care is following along and per chart review, wife had made patietn comfort care yesterday; but after talking with family had decided to change him back to full code. Patient and family removed DNR status and comfort care and patient is currently full code and family is wanting full scope and aggressive care. Wife reports she is hopeful that the PEG tube will help her husband get his nutrition under better control and improve his health. Wife reports that prior to the hospital there were no issues swallowing. During this hospital stay, there appears to be a cognitive decline in setting of dementia complicated by metabolic encephalopathy. Wife has several questions about PEG tube placement and what the procedure entails. She reports that patient's mental status has been progressively declining over the past year. She is worried because he is not meeting his nutritional needs. She reports today was a much better day and patient has been able to eat a little bit of pureed  pineapple, ensure, and applesauce. She reports today has been a much better day and she is encouraged with his progress. He was very talkative earlier this afternoon and interactive, but is currently napping. Plan is to go to SNF after discharge from the hospital.    Last Colonoscopy: N/A Last Endoscopy: N/A   Past Medical History:  Past Medical History:  Diagnosis Date  . Allergy   . Arthritis   . Hypertension   . Rheumatoid arthritis ([place])   . Seizures ([place])   . Sepsis ([place]) 05/23/2015    Problem List: Patient Active Problem List   Diagnosis Date Noted  . Oral mucositis 04/14/2019  . Neutropenia with fever ([place]) 04/11/2019  . Acute metabolic encephalopathy 63/78/5885  . Pancytopenia ([place])   . Cellulitis of lower extremity 04/08/2019  . Folic acid deficiency   . AKI (acute kidney injury) ([place])   . Pressure injury of skin 04/05/2019  . Other pancytopenia ([place])   . Cellulitis 03/31/2019  . Edema of both legs 08/19/2018  . History of allergy to latex 07/22/2018  . Lymphedema 06/16/2018  . Chronic venous insufficiency 06/16/2018  . Venous ulcer of leg ([place]) 06/16/2018  . PAD (peripheral artery disease) ([place]) 06/16/2018  . Diabetes mellitus type 2, uncomplicated ([place]) 02/77/4128  . 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 Obesity (BMI 30.0-34.9) 02/27/2018  . Primary osteoarthritis of left knee 02/27/2018  . Urinary retention 02/25/2018  . Rheumatoid arthritis involving multiple sites ([place]) 02/25/2018  . Left knee pain 02/20/2018  . Neurogenic pain 10/02/2017  . Spondylosis without myelopathy or radiculopathy, lumbosacral region 07/17/2017  . Trigger  point with back pain (recurrent/intermittent) 07/17/2017  . Chronic musculoskeletal pain 02/27/2017  . Myofascial pain syndrome 02/27/2017  . Long term current use of anticoagulant (Plaquenil) 02/27/2017  . Chronic lumbar radiculitis (L5 dermatome) (Right) 02/27/2017  . Chronic lumbar radiculitis (L3 dermatome)  (Left) 02/27/2017  . Chronic foot pain (Right) 02/27/2017  . Chronic thoracic back pain (Bilateral) (R>L) 02/27/2017  . Chronic neck pain (posterior) (Bilateral) (R>L) 02/27/2017  . Chronic shoulder pain (Bilateral) (L>R) 02/27/2017  . 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 DDD (degenerative disc disease), lumbar 02/19/2017  . 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 Chronic knee pain (Bilateral) (R>L) 02/19/2017  . 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 Grade 1 Retrolisthesis of L1 over L2 02/19/2017  . T11 wedge compression fracture ([place]. 1993) 02/19/2017  . Lumbar facet arthropathy 02/19/2017  . Lumbar facet syndrome (Bilateral) 02/19/2017  . Lumbar foraminal stenosis (Bilateral: L1-2)  (Right: L3-4, L4-5) 02/19/2017  . 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 Disorder of skeletal system 02/19/2017  . Pharmacologic therapy 02/19/2017  . Problems influencing health status 02/19/2017  . Chronic pain syndrome 01/14/2017  . Gastroesophageal reflux disease 01/14/2017  . Hyperlipidemia 01/14/2017  . Lumbar radiculopathy 01/14/2017  . Postoperative anemia due to acute blood loss 01/14/2017  . Postprocedural hypotension 01/14/2017  . Chronic lower extremity pain (Secondary Area of Pain) (Bilateral) (R>L) 01/14/2017  . Rheumatoid arthritis ([place]) 12/17/2013  . Diabetes ([place]) 12/17/2013  . Benign essential hypertension 11/13/2013  . Chronic low back pain (Primary Area of Pain) (Bilateral) (R>L) 11/13/2013  . Prostatitis 11/13/2013  . Seizure ([place]) 11/13/2013  . Left bundle branch block 05/21/2013  . Dorsalgia 05/21/2013    Past Surgical History: Past Surgical History:  Procedure Laterality Date  . back surgery five times  04/2016  . CARPAL TUNNEL RELEASE    . HERNIA REPAIR    . JOINT REPLACEMENT    . knee replacement right    . right hip replacement    . rotator cuff surgery    . SPINE SURGERY       Allergies: Allergies  Allergen Reactions  . Cefoperazone Anaphylaxis  . Penicillins Anaphylaxis and Other (See Comments)    Has patient had a PCN reaction causing immediate rash, facial/tongue/throat swelling, SOB or lightheadedness with hypotension: Yes Has patient had a PCN reaction causing severe rash involving mucus membranes or skin necrosis: No Has patient had a PCN reaction that required hospitalization No Has patient had a PCN reaction occurring within the last 10 years: No If all of the above answers are "NO", then may proceed with Cephalosporin use.  . Latex Rash    Home Medications: Medications Prior to Admission  Medication Sig Dispense Refill Last Dose  . aspirin 81 MG chewable tablet Chew 81 mg by mouth daily.   03/31/2019 at 0800  . Cinnamon 500 MG capsule Take 500 mg by mouth 2 (two) times daily.   03/31/2019 at 0800  . ciprofloxacin (CIPRO) 500 MG tablet Take 500 mg by mouth 2 (two) times daily.   03/31/2019 at 0800  . [EXPIRED] clindamycin (CLEOCIN) 300 MG capsule Take 300 mg by mouth 4 (four) times daily. For 14 days   03/31/2019 at 0800  . etanercept (ENBREL SURECLICK) 50 MG/ML injection Inject 50 mg into the skin every Friday.    Past Week at Unknown time  . folic acid (FOLVITE) 1 MG tablet Take 1 mg by mouth daily.  03/31/2019 at 0800  . gabapentin (NEURONTIN) 300 MG capsule Take 1 capsule (300 mg total) by mouth 4 (four) times daily. QHS 120 capsule 0 03/30/2019 at 2000  . hydroxychloroquine (PLAQUENIL) 200 MG tablet Take 400 mg by mouth at bedtime.   03/30/2019 at 2000  . lamoTRIgine (LAMICTAL) 200 MG tablet Take 200 mg by mouth 2 (two) times daily.   03/31/2019 at 0800  . lamoTRIgine (LAMICTAL) 25 MG tablet Take 25 mg by mouth 2 (two) times daily.   03/31/2019 at 0800  . magnesium oxide (MAG-OX) 400 MG tablet Take 400 mg by mouth daily.   03/31/2019 at 0800  . methotrexate (RHEUMATREX) 2.5 MG tablet TAKE 10 TABS BY MOUTH ONCE A WEEK   03/30/2019 at Unknown time  .  nystatin-triamcinolone ointment (MYCOLOG) Apply 1 application topically 2 (two) times daily. 30 g 2 03/31/2019 at 0800  . omeprazole (PRILOSEC) 40 MG capsule Take 40 mg by mouth daily.    03/31/2019 at 0800  . traZODone (DESYREL) 50 MG tablet Take 50 mg by mouth at bedtime.   03/30/2019 at 2000  . acetaminophen (TYLENOL) 325 MG tablet Take 2 tablets (650 mg total) by mouth every 6 (six) hours as needed for mild pain (or Fever >/= 101).   prn at prn  . polyethylene glycol (MIRALAX / GLYCOLAX) packet Take 17 g by mouth daily. 14 each 0 prn at prn   Home medication reconciliation was completed with the patient.   Scheduled Inpatient Medications:   . feeding supplement (NEPRO CARB STEADY)  237 mL Oral BID BM  . folic acid  1 mg Intravenous Daily  . metoprolol tartrate  2.5 mg Intravenous Q6H  . pantoprazole (PROTONIX) IV  40 mg Intravenous Q24H  . sodium chloride flush  3 mL Intravenous Q12H  . thiamine injection  100 mg Intravenous Daily    Continuous Inpatient Infusions:   . 0.9 % NaCl with KCl 20 mEq / L 75 mL/hr at 04/22/19 1209  . lacosamide (VIMPAT) IV 100 mg (04/22/19 1108)    PRN Inpatient Medications:  antiseptic oral rinse, ketorolac, lip balm, magic mouthwash w/lidocaine, [DISCONTINUED] ondansetron **OR** ondansetron (ZOFRAN) IV, polyvinyl alcohol  Family History: family history includes Dementia in his father and mother; Hypertension in his mother and another family member.  The patient's family history is negative for inflammatory bowel disorders, GI malignancy, or solid organ transplantation.  Social History:   reports that he has quit smoking. His smoking use included cigarettes. He has a 20.00 pack-year smoking history. He has never used smokeless tobacco. He reports that he does not drink alcohol. The patient denies ETOH, tobacco, or drug use.   Review of Systems:  Unable to obtain due to patient's mental status.    Physical Examination: BP 109/64 (BP Location: Left  Arm)   Pulse 91   Temp 98.6 F (37 C) (Oral)   Resp 20   Ht 6' (1.829 m)   Wt 117 kg   SpO2 96%   BMI 34.98 kg/m  Gen: NAD, alert to self. Appears chronically ill HEENT: PEERLA, EOMI, Neck: supple, no JVD or thyromegaly Chest: CTA bilaterally, no wheezes, crackles, or other adventitious sounds CV: RRR, no m/g/c/r Abd: soft, NT, ND, +BS in all four quadrants; no HSM, guarding, ridigity, or rebound tenderness Ext: 3+ bilateral lower extremity edema Skin: no rash or lesions noted Lymph: no LAD  Data: Lab Results  Component Value Date   WBC 25.8 (H) 04/22/2019   HGB 8.1 (L) 04/22/2019  HCT 23.3 (L) 04/22/2019   MCV 80.6 04/22/2019   PLT 472 (H) 04/22/2019   Recent Labs  Lab 04/17/19 0849 04/19/19 0456 04/22/19 0415  HGB 8.7* 8.4* 8.1*   Lab Results  Component Value Date   NA 142 04/22/2019   K 3.1 (L) 04/22/2019   CL 112 (H) 04/22/2019   CO2 23 04/22/2019   BUN 11 04/22/2019   CREATININE 0.95 04/22/2019   Lab Results  Component Value Date   ALT 29 04/01/2019   AST 37 04/01/2019   ALKPHOS 66 04/01/2019   BILITOT 0.7 04/01/2019   No results for input(s): APTT, INR, PTT in the last 168 hours. 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 GI consulted in context of oropharyngeal dysphagia  1. Oropharyngeal dysphagia 2. Dementia and progressive decline in mental status 3. Pancytopenia - hematology following along  -Discussed with wife and patient the indications for and details of PEG tube placement. Wife and family are wanting full aggressive care. We discussed that PEG tube does not change patient's prognosis, but can be used for nutrition and medication. -Plan for PEG tube when clinically feasible -Wife is wanting to see if patient continues to improve -Continue Dysphagia 1 diet - puree -Following along   Thank you for the  consult. Please call with questions or concerns.  Reeves For[facility] [phone] (Cell)

## 2019-04-22 NOTE — TOC Progression Note (Signed)
Transition of Care (TOC) - Progression Note    Patient Details  Name: Jeremiah [name]. MRN: [id number] Date of Birth: 12/31/1940  Transition of Care [facility]) CM/SW [place], [place] Phone Number: 04/22/2019, 4:21 PM  Clinical Narrative:     Dr. Posey Pronto and family stated that the patient is now off of comfort care. Patient is agreeable to SNF. Patient prefers [facility]. CSW updated FL2 and resent out to SNF placements. Awaiting decisions.   Patient will be getting a PICC line.   Expected Discharge Plan: [place]    Expected Discharge Plan and Services Expected Discharge Plan: [place]   Discharge Planning Services: CM Consult   Living arrangements for the past 2 months: Single Family Home Expected Discharge Date: 04/02/19                                     Social Determinants of Health (SDOH) Interventions    Readmission Risk Interventions No flowsheet data found.

## 2019-04-23 LAB — BASIC METABOLIC PANEL
Anion gap: 9 (ref 5–15)
BUN: 12 mg/dL (ref 8–23)
CO2: 22 mmol/L (ref 22–32)
Calcium: 8.4 mg/dL — ABNORMAL LOW (ref 8.9–10.3)
Chloride: 115 mmol/L — ABNORMAL HIGH (ref 98–111)
Creatinine, Ser: 0.78 mg/dL (ref 0.61–1.24)
GFR calc Af Amer: >60 mL/min (ref >60)
GFR calc non Af Amer: >60 mL/min (ref >60)
Glucose, Bld: 98 mg/dL (ref 70–99)
Potassium: 3.3 mmol/L — ABNORMAL LOW (ref 3.5–5.1)
Sodium: 146 mmol/L — ABNORMAL HIGH (ref 135–145)

## 2019-04-23 LAB — URINALYSIS, COMPLETE (UACMP) WITH MICROSCOPIC
Bacteria, UA: NONE SEEN
Bilirubin Urine: NEGATIVE
Glucose, UA: NEGATIVE mg/dL
Ketones, ur: NEGATIVE mg/dL
Leukocytes,Ua: NEGATIVE
Nitrite: NEGATIVE
Protein, ur: 30 mg/dL — AB
Specific Gravity, Urine: 1.019 (ref 1.005–1.030)
pH: 6 (ref 5.0–8.0)

## 2019-04-23 MED ORDER — POTASSIUM CHLORIDE IN NACL 20-0.45 MEQ/L-% IV SOLN
INTRAVENOUS | Status: DC
  Administered 2019-04-23: 12:00:00 via INTRAVENOUS
  Filled 2019-04-23 (×3): qty 1000

## 2019-04-23 MED ORDER — PANTOPRAZOLE SODIUM 40 MG IV SOLR
40.0000 mg | Freq: Two times a day (BID) | INTRAVENOUS | Status: DC
  Administered 2019-04-23 – 2019-04-24 (×3): 40 mg via INTRAVENOUS
  Filled 2019-04-23 (×3): qty 40

## 2019-04-23 MED ORDER — VANCOMYCIN HCL 1.5 G IV SOLR
1500.0000 mg | Freq: Once | INTRAVENOUS | Status: AC
  Administered 2019-04-24: 1500 mg via INTRAVENOUS
  Filled 2019-04-23: qty 1500

## 2019-04-23 NOTE — Progress Notes (Addendum)
GI Inpatient Follow-up Note  Subjective:  Patient seen in follow-up for oropharyngeal dysphagia and malnutrition with reduced oral intake. No acute events overnight. Patient has not had anything to eat today, but wife reports she is trying to feed him some of the pureed food for lunch. Wife and family are now requesting PEG to be placed as soon as possible. Patient appears comfortable and is in no acute distress.   Scheduled Inpatient Medications:  . feeding supplement (NEPRO CARB STEADY)  237 mL Oral BID BM  . folic acid  1 mg Intravenous Daily  . metoprolol tartrate  2.5 mg Intravenous Q6H  . pantoprazole (PROTONIX) IV  40 mg Intravenous Q12H  . sodium chloride flush  3 mL Intravenous Q12H  . thiamine injection  100 mg Intravenous Daily    Continuous Inpatient Infusions:   . 0.45 % NaCl with KCl 20 mEq / L 75 mL/hr at 04/23/19 1204  . lacosamide (VIMPAT) IV 100 mg (04/23/19 1250)    PRN Inpatient Medications:  antiseptic oral rinse, ketorolac, lip balm, magic mouthwash w/lidocaine, [DISCONTINUED] ondansetron **OR** ondansetron (ZOFRAN) IV, polyvinyl alcohol  Review of Systems:  Unable to obtain due to patient's clinical status   Physical Examination: BP 131/74 (BP Location: Left Arm)   Pulse 97   Temp 98.2 F (36.8 C)   Resp 20   Ht 6' (1.829 m)   Wt 117 kg   SpO2 98%   BMI 34.98 kg/m     Gen: NAD, alert. Somewhat emotional today, tearful.  HEENT: PEERLA, EOMI, Neck: supple, no JVD or thyromegaly Chest: CTA bilaterally, no wheezes, crackles, or other adventitious sounds CV: RRR, no m/g/c/r Abd: soft, NT, ND, +BS in all four quadrants; no HSM, guarding, ridigity, or rebound tenderness Ext: no edema, well perfused with 2+ pulses, Skin: no rash or lesions noted Lymph: no LAD  Data: Lab Results  Component Value Date   WBC 25.8 (H) 04/22/2019   HGB 8.1 (L) 04/22/2019   HCT 23.3 (L) 04/22/2019   MCV 80.6 04/22/2019   PLT 472 (H) 04/22/2019   Recent Labs  Lab  04/17/19 0849 04/19/19 0456 04/22/19 0415  HGB 8.7* 8.4* 8.1*   Lab Results  Component Value Date   NA 146 (H) 04/23/2019   K 3.3 (L) 04/23/2019   CL 115 (H) 04/23/2019   CO2 22 04/23/2019   BUN 12 04/23/2019   CREATININE 0.78 04/23/2019   Lab Results  Component Value Date   ALT 29 04/01/2019   AST 37 04/01/2019   ALKPHOS 66 04/01/2019   BILITOT 0.7 04/01/2019   No results for input(s): APTT, INR, PTT in the last 168 hours. 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 GI consulted in context of oropharyngeal dysphagia  1. Oropharyngeal dysphagia 2. Dementia and progressive decline in mental status 3. Pancytopenia - hematology following along  -Wife and family are now on board with PEG tube placement for nutrition and medications -Plan for PEG tube placement tomorrow morning with Dr. Alice Reichert at [facility] dysphagia diet - pureed -NPO after midnight -See procedure note for any further recommendations -Dietary has provided tube feeding recommendations   Please call with questions or concerns.    Octavia Bruckner, PA-C [facility] [phone] (Cell)

## 2019-04-23 NOTE — TOC Progression Note (Addendum)
Transition of Care (TOC) - Progression Note    Patient Details  Name: Jeremiah [name]. MRN: [id number] Date of Birth: 12-13-1940  Transition of Care [facility]) CM/SW Contact  Shade Flood, LCSW Phone Number: 04/23/2019, 2:17 PM  Clinical Narrative:     TOC following. Pt status discussed in Progression today. Pt will be having a PEG tube placed tomorrow. Plan remains for SNF at dc. Met with pt and his wife to review options. At this time, pt's wife asking if pt can go to [facility]. If not, pt would like [facility]. Will follow up with [facility] to inquire.  Pt's insurance wants request for authorization submitted within 24 hours of anticipated dc. Pt will likely need another negative Covid test result before he can transfer.   Spoke with Otila Kluver at [facility] and she states that they will take pt if the insurance will approve a "carve out" of the enbrel injection as it is very costly. Per Otila Kluver, when we start insurance authorization with Everlene Balls, we need to request this carve out. If the carve out is not approved, will ask Otila Kluver if pt can bring in medication from home.  TOC will follow and continue to assist.  Expected Discharge Plan: [place]    Expected Discharge Plan and Services Expected Discharge Plan: [place]   Discharge Planning Services: CM Consult   Living arrangements for the past 2 months: Single Family Home Expected Discharge Date: 04/02/19                                     Social Determinants of Health (SDOH) Interventions    Readmission Risk Interventions No flowsheet data found.

## 2019-04-23 NOTE — Consult Note (Signed)
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 His sodium has been trending up due to inadequate oral intake. He is currently receiving NS w/20 mEq/L KCl at 75 mL/hr and awaiting PEG tube placement.  Goal of Therapy:  Electrolytes WNL  Plan:   I discussed this patient with Dr Posey Pronto and we are changing fluids to 1/2NS w/20 mEq KCl/L and continue at 75 mL/hr  BMP, magnesium in am  Dallie Piles ,PharmD Clinical Pharmacist 04/23/2019 7:40 AM

## 2019-04-23 NOTE — Progress Notes (Signed)
[facility] NAME: Jeremiah [name]    MR#:  [id number]  DATE OF BIRTH:  [birth year]  SUBJECTIVE:  patient's wife is in the room.  "He tells me he wants some of my hair for his bald head"   Patient is avoiding to eat food because of the consistency of pured diet. He reports not liking it. Has had only few bites all day yesterday. REVIEW OF SYSTEMS:   Review of Systems  Unable to perform ROS: Medical condition   Tolerating Diet: on dyshpagia 1 pureed diet--not much Tolerating PT: pending  DRUG ALLERGIES:   Allergies  Allergen Reactions  . Cefoperazone Anaphylaxis  . Penicillins Anaphylaxis and Other (See Comments)    Has patient had a PCN reaction causing immediate rash, facial/tongue/throat swelling, SOB or lightheadedness with hypotension: Yes Has patient had a PCN reaction causing severe rash involving mucus membranes or skin necrosis: No Has patient had a PCN reaction that required hospitalization No Has patient had a PCN reaction occurring within the last 10 years: No If all of the above answers are "NO", then may proceed with Cephalosporin use.  . Latex Rash    VITALS:  Blood pressure 131/74, pulse 97, temperature 98.2 F (36.8 C), resp. rate 20, height 6' (1.829 m), weight 117 kg, SpO2 98 %.  PHYSICAL EXAMINATION:   Physical Exam  GENERAL:  78 y.o.-year-old patient lying in the bed with no acute distress. Appears calm more awake  HEENT: Head atraumatic, normocephalic. Oropharynx and nasopharynx clear. Mucosa dry NECK:  Supple, no jugular venous distention. No thyroid enlargement, no tenderness.  LUNGS: Normal breath sounds bilaterally, no wheezing, rales, rhonchi. No use of accessory muscles of respiration.  CARDIOVASCULAR: S1, S2 normal. No murmurs, rubs, or gallops.  ABDOMEN: Soft, nontender, nondistended. Bowel sounds present. No organomegaly or mass.  EXTREMITIES: No cyanosis, clubbing  -chronic venous stasis  with one plus edema b/l.    NEUROLOGIC: grolssly nonfocal moves all extremities  PSYCHIATRIC:  patient is alert and calm--talking some  LABORATORY PANEL:  CBC Recent Labs  Lab 04/22/19 0415  WBC 25.8*  HGB 8.1*  HCT 23.3*  PLT 472*    Chemistries  Recent Labs  Lab 04/22/19 0415 04/23/19 0626  NA 142 146*  K 3.1* 3.3*  CL 112* 115*  CO2 23 22  GLUCOSE 87 98  BUN 11 12  CREATININE 0.95 0.78  CALCIUM 8.2* 8.4*  MG 2.0  --    Cardiac Enzymes No results for input(s): TROPONINI in the last 168 hours. RADIOLOGY:  Dg Hand Complete Right  Result Date: 04/21/2019 CLINICAL DATA:  Swelling, pain EXAM: RIGHT HAND - COMPLETE 3+ VIEW COMPARISON:  None. FINDINGS: No acute bony abnormality. Specifically, no fracture, subluxation, or dislocation. Early spurring in the MCP joints, particularly 2nd and 3rd MCP joints with probable erosions in the 2nd metacarpal head. Joint spaces are maintained. Soft tissues are intact. Calcifications in the triangular fibrocartilage. IMPRESSION: Arthritic changes as above.  No acute bony abnormality. Electronically Signed   By: Rolm Baptise M.D.   On: 04/21/2019 22:08   ASSESSMENT AND PLAN:  78 year old man PMH including chronic lymphedema, rheumatoid arthritis, seizure disorder, presented with worsening leg infection.  Treated in [facility] ED for cellulitis 11/5, discharged on Cipro and clindamycin.  Admitted for bilateral lower extremity cellulitis, failed outpatient therapy with acute kidney injury.  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  WBC and platelet counts have recovered. --followed by Dr. Mike Gip, she feels this is likely related to methotrexate and not myelodysplastic disorder,her opinion is that the prognosis would be 5 to 10 years. -Counts remain stable.  Acute metabolic encephalopathy with intermittent  confusion, most likely delirium, superimposed on cognitive decline in the setting of dementia.  CT head was limited but showed no acute abnormalities.  Seen by neurology.  MRI brain was unremarkable.  EEG showed no evidence of seizure.  On minimal medications, no sedating medications.  Discussed with Dr. Doy Mince in patient's room with wife    Discussed with patient's wife. Patient appears more alert today. They want to recede DNR change to full code and after discussing with her two sons they wish to proceed with peg tube feeding.  Dysphagia. --Dysphagia 1 diet, nectar thick liquid recommended 12/31 -discussed with patient's wife Mrs. Nanda at bedside. Given poor PO intake with worsening labs it is advisable to go ahead and place the peg tube so that patient gets his nutrition without further delay   Chronic anemia. --Has remained stable.   -Continue folic acid supplementation per hematology.  Leukocytosis secondary to Granix. --Trending down appropriately.    Rheumatoid arthritis on immunosuppressive therapy (methotrexate) --Medications remain on hold  Seizure disorder --No recurrent seizures.  Lamictal stopped given n.p.o. status.  Discussed with neurology, substituted with IV Vimpat.  Neuropathy --Gabapentin discontinued given encephalopathy.  Chronic lymphedema --No further evaluation suggested.  Pressure ulcers present on admission buttock and sacrum, stage II sacral wound. -- Barrier cream twice daily and PRN soilage.   Patient will need to be discharged to rehab after feeding tube was placed. Wife is in agreement with it. Will get social worker involved. PT consultation placed   Family communication : with wife in the room Consults : hematology, ID, neurology, GI Discharge Disposition :SNF CODE STATUS: FULL-- wife rescinded DNR status on April 22, 2019 DVT Prophylaxis : heparin held for peg tube feeding placement  TOTAL TIME TAKING CARE OF THIS PATIENT: *30*  minutes.  >50% time spent on counselling and coordination of care  POSSIBLE D/C IN ** few* DAYS, DEPENDING ON CLINICAL CONDITION.  Note: This dictation was prepared with Dragon dictation along with smaller phrase technology. Any transcriptional errors that result from this process are unintentional.  [name] M.D on 04/23/2019 at 11:18 AM  Between 7am to 6pm - Pager - [phone]  After 6pm go to [email or web address]  [facility]   CC: Primary care physician; Derinda Late, MDPatient ID: [name]., male   DOB: Oct 16, 1940, 78 y.o.   MRN: [id number]

## 2019-04-23 NOTE — Progress Notes (Addendum)
SLP Cancellation Note  Patient Details Name: Klark Clerc. MRN: [id number] DOB: 1940-06-16   Cancelled treatment:       Reason Eval/Treat Not Completed: (chart reviewed; consulted Palliative Care yesterday).  Most recent CXR revealed: "No evidence of pneumonia or other acute process.".  Pt remains on a Dysphagia level 1 diet w/ Nectar liquids currently awaiting a PEG placement d/t reduced oral intake overall, dysphagia. Pt has "acute metabolic encephalopathy with intermittent confusion, most likely delirium, superimposed on Cognitive Decline in the setting of Dementia". Suspect pt's Cognitive decline in impacting his oral awareness for eating/drinking safely and sufficiently to meet his nutrition/hydration needs. Wife has opted for a feeding tube, PEG. GI is now following.  Recommend continue a dysphagia diet while taking oral intake to reduce risk for aspiration; recommend general aspiration precautions and support w/ encouragement to eat/drink at meals; Supervision w/ oral intake. Recommend frequent oral care d/t mouth sores. No further skilled ST services indicated at this time. Recommend ST f/u at next venue of care to maintain oral care/health and swallowing; education.     Orinda Kenner, MS, CCC-SLP Watson,Katherine 04/23/2019, 11:09 AM

## 2019-04-23 NOTE — H&P (View-Only) (Signed)
GI Inpatient Follow-up Note  Subjective:  Patient seen in follow-up for oropharyngeal dysphagia and malnutrition with reduced oral intake. No acute events overnight. Patient has not had anything to eat today, but wife reports she is trying to feed him some of the pureed food for lunch. Wife and family are now requesting PEG to be placed as soon as possible. Patient appears comfortable and is in no acute distress.   Scheduled Inpatient Medications:  . feeding supplement (NEPRO CARB STEADY)  237 mL Oral BID BM  . folic acid  1 mg Intravenous Daily  . metoprolol tartrate  2.5 mg Intravenous Q6H  . pantoprazole (PROTONIX) IV  40 mg Intravenous Q12H  . sodium chloride flush  3 mL Intravenous Q12H  . thiamine injection  100 mg Intravenous Daily    Continuous Inpatient Infusions:   . 0.45 % NaCl with KCl 20 mEq / L 75 mL/hr at 04/23/19 1204  . lacosamide (VIMPAT) IV 100 mg (04/23/19 1250)    PRN Inpatient Medications:  antiseptic oral rinse, ketorolac, lip balm, magic mouthwash w/lidocaine, [DISCONTINUED] ondansetron **OR** ondansetron (ZOFRAN) IV, polyvinyl alcohol  Review of Systems:  Unable to obtain due to patient's clinical status   Physical Examination: BP 131/74 (BP Location: Left Arm)   Pulse 97   Temp 98.2 F (36.8 C)   Resp 20   Ht 6' (1.829 m)   Wt 117 kg   SpO2 98%   BMI 34.98 kg/m     Gen: NAD, alert. Somewhat emotional today, tearful.  HEENT: PEERLA, EOMI, Neck: supple, no JVD or thyromegaly Chest: CTA bilaterally, no wheezes, crackles, or other adventitious sounds CV: RRR, no m/g/c/r Abd: soft, NT, ND, +BS in all four quadrants; no HSM, guarding, ridigity, or rebound tenderness Ext: no edema, well perfused with 2+ pulses, Skin: no rash or lesions noted Lymph: no LAD  Data: Lab Results  Component Value Date   WBC 25.8 (H) 04/22/2019   HGB 8.1 (L) 04/22/2019   HCT 23.3 (L) 04/22/2019   MCV 80.6 04/22/2019   PLT 472 (H) 04/22/2019   Recent Labs  Lab  04/17/19 0849 04/19/19 0456 04/22/19 0415  HGB 8.7* 8.4* 8.1*   Lab Results  Component Value Date   NA 146 (H) 04/23/2019   K 3.3 (L) 04/23/2019   CL 115 (H) 04/23/2019   CO2 22 04/23/2019   BUN 12 04/23/2019   CREATININE 0.78 04/23/2019   Lab Results  Component Value Date   ALT 29 04/01/2019   AST 37 04/01/2019   ALKPHOS 66 04/01/2019   BILITOT 0.7 04/01/2019   No results for input(s): APTT, INR, PTT in the last 168 hours. 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 GI consulted in context of oropharyngeal dysphagia  1. Oropharyngeal dysphagia 2. Dementia and progressive decline in mental status 3. Pancytopenia - hematology following along  -Wife and family are now on board with PEG tube placement for nutrition and medications -Plan for PEG tube placement tomorrow morning with Dr. Alice Reichert at [facility] dysphagia diet - pureed -NPO after midnight -See procedure note for any further recommendations -Dietary has provided tube feeding recommendations   Please call with questions or concerns.    Octavia Bruckner, PA-C [facility] [phone] (Cell)

## 2019-04-23 NOTE — Care Management Important Message (Signed)
Important Message  Patient Details  Name: Jeremiah [name]. MRN: [id number] Date of Birth: June 25, 1940   Medicare Important Message Given:  Yes     [name] 04/23/2019, 1:07 PM

## 2019-04-24 ENCOUNTER — Inpatient Hospital Stay: Payer: Medicare Other | Admitting: Anesthesiology

## 2019-04-24 ENCOUNTER — Encounter
Admission: EM | Disposition: A | Discharge status: Skilled Nursing Facility | Payer: Self-pay | Source: Home / Self Care | Attending: Internal Medicine

## 2019-04-24 ENCOUNTER — Encounter: Payer: Self-pay | Admitting: *Deleted

## 2019-04-24 ENCOUNTER — Other Ambulatory Visit: Payer: Self-pay

## 2019-04-24 DIAGNOSIS — R1310 Dysphagia, unspecified: Secondary | ICD-10-CM

## 2019-04-24 HISTORY — PX: PEG PLACEMENT: SHX5437

## 2019-04-24 LAB — CBC
HCT: 26.6 % — ABNORMAL LOW (ref 39.0–52.0)
Hemoglobin: 8.6 g/dL — ABNORMAL LOW (ref 13.0–17.0)
MCH: 27.5 pg (ref 26.0–34.0)
MCHC: 32.3 g/dL (ref 30.0–36.0)
MCV: 85 fL (ref 80.0–100.0)
Platelets: 570 10*3/uL — ABNORMAL HIGH (ref 150–400)
RBC: 3.13 MIL/uL — ABNORMAL LOW (ref 4.22–5.81)
RDW: 21.4 % — ABNORMAL HIGH (ref 11.5–15.5)
WBC: 15.2 10*3/uL — ABNORMAL HIGH (ref 4.0–10.5)
nRBC: 0.5 % — ABNORMAL HIGH (ref 0.0–0.2)

## 2019-04-24 LAB — MAGNESIUM: Magnesium: 2 mg/dL (ref 1.7–2.4)

## 2019-04-24 LAB — URINE CULTURE: Culture: <10000 — AB

## 2019-04-24 LAB — BASIC METABOLIC PANEL
Anion gap: 8 (ref 5–15)
BUN: 10 mg/dL (ref 8–23)
CO2: 22 mmol/L (ref 22–32)
Calcium: 8.1 mg/dL — ABNORMAL LOW (ref 8.9–10.3)
Chloride: 116 mmol/L — ABNORMAL HIGH (ref 98–111)
Creatinine, Ser: 0.74 mg/dL (ref 0.61–1.24)
GFR calc Af Amer: >60 mL/min (ref >60)
GFR calc non Af Amer: >60 mL/min (ref >60)
Glucose, Bld: 97 mg/dL (ref 70–99)
Potassium: 3.4 mmol/L — ABNORMAL LOW (ref 3.5–5.1)
Sodium: 146 mmol/L — ABNORMAL HIGH (ref 135–145)

## 2019-04-24 LAB — GLUCOSE, CAPILLARY: Glucose-Capillary: 89 mg/dL (ref 70–99)

## 2019-04-24 SURGERY — INSERTION, PEG TUBE
Anesthesia: General

## 2019-04-24 MED ORDER — FENTANYL CITRATE (PF) 100 MCG/2ML IJ SOLN
INTRAMUSCULAR | Status: AC
  Filled 2019-04-24: qty 2

## 2019-04-24 MED ORDER — LIDOCAINE HCL (CARDIAC) PF 100 MG/5ML IV SOSY
PREFILLED_SYRINGE | INTRAVENOUS | Status: DC | PRN
  Administered 2019-04-24: 80 mg via INTRAVENOUS

## 2019-04-24 MED ORDER — SODIUM CHLORIDE 0.9 % IV SOLN: INTRAVENOUS | Status: DC

## 2019-04-24 MED ORDER — PROPOFOL 10 MG/ML IV BOLUS
INTRAVENOUS | Status: AC
  Filled 2019-04-24: qty 80

## 2019-04-24 MED ORDER — LACTATED RINGERS IV SOLN
INTRAVENOUS | Status: DC | PRN
  Administered 2019-04-24: 08:00:00 via INTRAVENOUS

## 2019-04-24 MED ORDER — PHENYLEPHRINE HCL (PRESSORS) 10 MG/ML IV SOLN
INTRAVENOUS | Status: AC
  Filled 2019-04-24: qty 1

## 2019-04-24 MED ORDER — PHENYLEPHRINE HCL (PRESSORS) 10 MG/ML IV SOLN
INTRAVENOUS | Status: DC | PRN
  Administered 2019-04-24 (×2): 100 ug via INTRAVENOUS

## 2019-04-24 MED ORDER — LIDOCAINE HCL (PF) 2 % IJ SOLN
INTRAMUSCULAR | Status: AC
  Filled 2019-04-24: qty 20

## 2019-04-24 MED ORDER — HEPARIN SODIUM (PORCINE) 5000 UNIT/ML IJ SOLN
5000.0000 [IU] | Freq: Three times a day (TID) | INTRAMUSCULAR | Status: DC
  Administered 2019-04-24 – 2019-04-27 (×10): 5000 [IU] via SUBCUTANEOUS
  Filled 2019-04-24 (×10): qty 1

## 2019-04-24 MED ORDER — SUCCINYLCHOLINE CHLORIDE 20 MG/ML IJ SOLN
INTRAMUSCULAR | Status: AC
  Filled 2019-04-24: qty 1

## 2019-04-24 MED ORDER — OSMOLITE 1.5 CAL PO LIQD
1000.0000 mL | ORAL | Status: DC
  Administered 2019-04-25: 1000 mL

## 2019-04-24 MED ORDER — GABAPENTIN 300 MG PO CAPS
300.0000 mg | ORAL_CAPSULE | Freq: Two times a day (BID) | ORAL | Status: DC
  Administered 2019-04-24: 300 mg
  Filled 2019-04-24: qty 1

## 2019-04-24 MED ORDER — ONDANSETRON HCL 4 MG/2ML IJ SOLN
INTRAMUSCULAR | Status: DC | PRN
  Administered 2019-04-24: 4 mg via INTRAVENOUS

## 2019-04-24 MED ORDER — PROPOFOL 10 MG/ML IV BOLUS
INTRAVENOUS | Status: DC | PRN
  Administered 2019-04-24: 20 mg via INTRAVENOUS
  Administered 2019-04-24: 120 mg via INTRAVENOUS
  Administered 2019-04-24: 20 mg via INTRAVENOUS

## 2019-04-24 MED ORDER — LAMOTRIGINE 25 MG PO TABS
250.0000 mg | ORAL_TABLET | Freq: Two times a day (BID) | ORAL | Status: DC
  Administered 2019-04-24 – 2019-04-27 (×6): 250 mg
  Filled 2019-04-24 (×6): qty 2

## 2019-04-24 MED ORDER — FREE WATER
150.0000 mL | Status: DC
  Administered 2019-04-25 – 2019-04-27 (×14): 150 mL

## 2019-04-24 MED ORDER — SUCCINYLCHOLINE CHLORIDE 20 MG/ML IJ SOLN
INTRAMUSCULAR | Status: DC | PRN
  Administered 2019-04-24: 200 mg via INTRAVENOUS

## 2019-04-24 MED ORDER — POTASSIUM CHLORIDE 20 MEQ PO PACK
40.0000 meq | PACK | Freq: Once | ORAL | Status: AC
  Administered 2019-04-24: 40 meq
  Filled 2019-04-24: qty 2

## 2019-04-24 MED ORDER — PANTOPRAZOLE SODIUM 40 MG PO TBEC
40.0000 mg | DELAYED_RELEASE_TABLET | Freq: Every day | ORAL | Status: DC
  Administered 2019-04-25 – 2019-04-27 (×3): 40 mg via ORAL
  Filled 2019-04-24 (×3): qty 1

## 2019-04-24 MED ORDER — VITAMIN B-1 100 MG PO TABS
100.0000 mg | ORAL_TABLET | Freq: Every day | ORAL | Status: DC
  Administered 2019-04-25 – 2019-04-27 (×3): 100 mg via ORAL
  Filled 2019-04-24 (×3): qty 1

## 2019-04-24 MED ORDER — LACOSAMIDE 10 MG/ML PO SOLN
100.0000 mg | Freq: Two times a day (BID) | ORAL | Status: DC
  Filled 2019-04-24: qty 10

## 2019-04-24 MED ORDER — METOPROLOL TARTRATE 25 MG PO TABS
12.5000 mg | ORAL_TABLET | Freq: Two times a day (BID) | ORAL | Status: DC
  Administered 2019-04-24 – 2019-04-26 (×5): 12.5 mg
  Filled 2019-04-24 (×6): qty 1

## 2019-04-24 MED ORDER — FENTANYL CITRATE (PF) 100 MCG/2ML IJ SOLN
INTRAMUSCULAR | Status: DC | PRN
  Administered 2019-04-24 (×2): 25 ug via INTRAVENOUS

## 2019-04-24 MED ORDER — GABAPENTIN 300 MG PO CAPS: 300.0000 mg | ORAL_CAPSULE | Freq: Four times a day (QID) | ORAL | Status: DC

## 2019-04-24 MED ORDER — FOLIC ACID 1 MG PO TABS
1.0000 mg | ORAL_TABLET | Freq: Every day | ORAL | Status: DC
  Administered 2019-04-25 – 2019-04-27 (×3): 1 mg
  Filled 2019-04-24 (×3): qty 1

## 2019-04-24 MED ORDER — POLYETHYLENE GLYCOL 3350 17 G PO PACK
17.0000 g | PACK | Freq: Every day | ORAL | Status: DC
  Administered 2019-04-24 – 2019-04-26 (×3): 17 g
  Filled 2019-04-24 (×3): qty 1

## 2019-04-24 MED ORDER — PRO-STAT SUGAR FREE PO LIQD
30.0000 mL | Freq: Two times a day (BID) | ORAL | Status: DC
  Administered 2019-04-26 – 2019-04-27 (×4): 30 mL

## 2019-04-24 NOTE — Progress Notes (Addendum)
Pt is ready to be D/C on Monday. Contacted Tina at [facility] and she reports that they can accept pt on Monday if authorization is obtained and they approve the carve out of the Enbrel injection. Contacted pt's wife [name]) and made her aware that the plan is to D/C pt on Monday. Discussed the Enbrel injection. She reports that he was D/C to another facility in the past and she provided the injections. She reports that she has the injections at home and she can provide the injections if needed. [place] to obtain insurance approval for SNF ([facility]). Spoke with Charlcie Cradle and initiated SNF approval. I asked for a carve out of the Enbrel injection. She asked that I call back in 1 or 2 hours to talk to a nurse about the carve out for the medication. Faxed clinical requested at [phone] and received confirmation that the fax went through. Pending reference # is [id number]. Will f/u with [facility] to inquiry a carve out of the Enbrel injection.

## 2019-04-24 NOTE — Anesthesia Preprocedure Evaluation (Addendum)
Anesthesia Evaluation  Patient identified by MRN, date of birth, ID band Patient awake and Patient confused    Reviewed: Allergy & Precautions, H&P , NPO status , Patient's Chart, lab work & pertinent test results  History of Anesthesia Complications Negative for: history of anesthetic complications  Airway Mallampati: III  TM Distance: >3 FB Neck ROM: limited    Dental  (+) Poor Dentition, Edentulous Lower, Edentulous Upper   Pulmonary neg pulmonary ROS, neg shortness of breath, former smoker,           Cardiovascular Exercise Tolerance: Good hypertension, (-) angina+ Peripheral Vascular Disease  (-) Past MI + dysrhythmias      Neuro/Psych Seizures -,   Neuromuscular disease negative psych ROS   GI/Hepatic negative GI ROS, Neg liver ROS, GERD  ,  Endo/Other  diabetes, Type 2  Renal/GU Renal disease  negative genitourinary   Musculoskeletal  (+) Arthritis ,   Abdominal   Peds  Hematology negative hematology ROS (+)   Anesthesia Other Findings Past Medical History: No date: Allergy No date: Arthritis No date: Hypertension No date: Rheumatoid arthritis ([place]) No date: Seizures ([place]) 05/23/2015: Sepsis ([place])  Past Surgical History: 04/2016: back surgery five times No date: CARPAL TUNNEL RELEASE No date: HERNIA REPAIR No date: JOINT REPLACEMENT No date: knee replacement right No date: right hip replacement No date: rotator cuff surgery No date: SPINE SURGERY  BMI    Body Mass Index: 34.98 kg/m      Reproductive/Obstetrics negative OB ROS                           Anesthesia Physical Anesthesia Plan  ASA: III  Anesthesia Plan: General   Post-op Pain Management:    Induction: Intravenous  PONV Risk Score and Plan: Propofol infusion and TIVA  Airway Management Planned: Natural Airway and Nasal Cannula  Additional Equipment:   Intra-op Plan:   Post-operative Plan:    Informed Consent: I have reviewed the patients History and Physical, chart, labs and discussed the procedure including the risks, benefits and alternatives for the proposed anesthesia with the patient or authorized representative who has indicated his/her understanding and acceptance.     Dental Advisory Given  Plan Discussed with: Anesthesiologist, CRNA and Surgeon  Anesthesia Plan Comments: ( History and phone consent from the patients son Ronne Mezger at [phone]   Son was consented for risks of anesthesia including but not limited to:  - adverse reactions to medications - risk of intubation if required - damage to teeth, lips or other oral mucosa - sore throat or hoarseness - Damage to heart, brain, lungs or loss of life  He voiced understanding.)       Anesthesia Quick Evaluation

## 2019-04-24 NOTE — Anesthesia Postprocedure Evaluation (Signed)
Anesthesia Post Note  Patient: Bart Barmes.  Procedure(s) Performed: PERCUTANEOUS ENDOSCOPIC GASTROSTOMY (PEG) PLACEMENT (N/A )  Patient location during evaluation: PACU Anesthesia Type: General Level of consciousness: awake and alert Pain management: pain level controlled Vital Signs Assessment: post-procedure vital signs reviewed and stable Respiratory status: spontaneous breathing, nonlabored ventilation, respiratory function stable and patient connected to nasal cannula oxygen Cardiovascular status: blood pressure returned to baseline and stable Postop Assessment: no apparent nausea or vomiting Anesthetic complications: no     Last Vitals:  Vitals:   04/24/19 0937 04/24/19 0951  BP: 109/71 111/75  Pulse: 87 87  Resp: 12 16  Temp:  36.7 C  SpO2: 96% 96%    Last Pain:  Vitals:   04/24/19 0951  TempSrc: Oral  PainSc:                  Precious Haws Ravin Bendall

## 2019-04-24 NOTE — Progress Notes (Addendum)
[facility] NAME: Jeremiah [name]    MR#:  [id number]  DATE OF BIRTH:  10/04/1940  SUBJECTIVE:  patient's wife is in the room.  Patient returned from PACU placement. He was awake a little earlier. Sleepy right now. REVIEW OF SYSTEMS:   Review of Systems  Unable to perform ROS: Medical condition   Tolerating Diet: on dyshpagia 1 pureed diet--not much Tolerating EL:FYBOF  DRUG ALLERGIES:   Allergies  Allergen Reactions  . Cefoperazone Anaphylaxis  . Penicillins Anaphylaxis and Other (See Comments)    Has patient had a PCN reaction causing immediate rash, facial/tongue/throat swelling, SOB or lightheadedness with hypotension: Yes Has patient had a PCN reaction causing severe rash involving mucus membranes or skin necrosis: No Has patient had a PCN reaction that required hospitalization No Has patient had a PCN reaction occurring within the last 10 years: No If all of the above answers are "NO", then may proceed with Cephalosporin use.  . Latex Rash    VITALS:  Blood pressure 111/75, pulse 87, temperature 98 F (36.7 C), temperature source Oral, resp. rate 16, height 6' (1.829 m), weight 104.3 kg, SpO2 96 %.  PHYSICAL EXAMINATION:   Physical Exam  GENERAL:  78 y.o.-year-old patient lying in the bed with no acute distress. Appears calm more awake  HEENT: Head atraumatic, normocephalic. Oropharynx and nasopharynx clear. Mucosa dry NECK:  Supple, no jugular venous distention. No thyroid enlargement, no tenderness.  LUNGS: Normal breath sounds bilaterally, no wheezing, rales, rhonchi. No use of accessory muscles of respiration.  CARDIOVASCULAR: S1, S2 normal. No murmurs, rubs, or gallops.  ABDOMEN: Soft, nontender, nondistended. Bowel sounds present. No organomegaly or mass. PEG+ EXTREMITIES: No cyanosis, clubbing  -chronic venous stasis with +edema b/l.    NEUROLOGIC: grolssly nonfocal moves all extremities  PSYCHIATRIC:   patient is alert and calm-- communicating much better. More alert  LABORATORY PANEL:  CBC Recent Labs  Lab 04/22/19 0415  WBC 25.8*  HGB 8.1*  HCT 23.3*  PLT 472*    Chemistries  Recent Labs  Lab 04/24/19 0458  NA 146*  K 3.4*  CL 116*  CO2 22  GLUCOSE 97  BUN 10  CREATININE 0.74  CALCIUM 8.1*  MG 2.0   Cardiac Enzymes No results for input(s): TROPONINI in the last 168 hours. RADIOLOGY:  No results found. ASSESSMENT AND PLAN:  78 year old man PMH including chronic lymphedema, rheumatoid arthritis, seizure disorder, presented with worsening leg infection.  Treated in [facility] ED for cellulitis 11/5, discharged on Cipro and clindamycin.  Admitted for bilateral lower extremity cellulitis, failed outpatient therapy with acute kidney injury.  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  WBC and platelet counts have recovered. --followed by Dr. Mike Gip, she feels this is likely related to methotrexate and not myelodysplastic disorder,her opinion is that the prognosis would be 5 to 10 years. -Counts remain stable.  Acute metabolic encephalopathy with intermittent confusion, most likely delirium, superimposed on cognitive decline in the setting of dementia.  CT head was limited but showed no acute abnormalities.  Seen by neurology.  MRI brain was unremarkable.  EEG showed no evidence of seizure. -Patient is more awake alert and converses now  Dysphagia. --Dysphagia 1 diet, nectar thick liquid recommended 12/31 -discussed with patient's wife [name] at bedside.  -Status post peg tube placement on 04/24/2019 by Dr. Alice Reichert -will start  water and meds via tube today and dietitian to start feeding from tomorrow  Chronic anemia. --Has remained stable.   -Continue folic acid supplementation per hematology.  Leukocytosis secondary to  Granix. --Trending appropriately.    Rheumatoid arthritis on immunosuppressive therapy  -d/cmethotrexate) -patient is on hydroxychloroquine and weekly shot off Enbrel. His rheumatologist is at [facility]. Wife to get contact number for the rheumatologist for Dr. Mike Gip to discuss whether we can start remaining meds.  Seizure disorder --No recurrent seizures.  Lamictal stopped given n.p.o. status.  Discussed with neurology, substituted with IV Vimpat--now changed back to lamictal 250 mg bid per Neuro rec.Marland Kitchen  Neuropathy --Gabapentin discontinued given encephalopathy.  Chronic lymphedema --No further evaluation suggested.  Pressure ulcers present on admission buttock and sacrum, stage II sacral wound. -- Barrier cream twice daily and PRN soilage.   Patient will need to be discharged to rehab.  Wife is in agreement with it. Will get social worker involved-- who tells me patient's insurance requires just 24 hour authorization. I have informed social worker that patient will discharged on Monday if everything works out well with his feeding. -I have ordered repeat COVID for Sunday.     Family communication : with wife in the room-- appreciates the care Consults : hematology, ID, neurology, GI Discharge Disposition :SNF CODE STATUS: FULL- DVT Prophylaxis : heparin   TOTAL TIME TAKING CARE OF THIS PATIENT: *30* minutes.  >50% time spent on counselling and coordination of care  POSSIBLE D/C IN ** few* DAYS, DEPENDING ON CLINICAL CONDITION.  Note: This dictation was prepared with Dragon dictation along with smaller phrase technology. Any transcriptional errors that result from this process are unintentional.  Jeremiah [name] M.D on 04/24/2019 at 11:50 AM  Between 7am to 6pm - Pager - [phone]  After 6pm go to [email or web address]  [facility]   CC: Primary care physician; Derinda Late, MDPatient ID: Jeremiah [name]., male   DOB: January 05, 1941, 78 y.o.   MRN: [id number]

## 2019-04-24 NOTE — Anesthesia Post-op Follow-up Note (Signed)
Anesthesia QCDR form completed.        

## 2019-04-24 NOTE — Transfer of Care (Signed)
Immediate Anesthesia Transfer of Care Note  Patient: Jeremiah [name].  Procedure(s) Performed: PERCUTANEOUS ENDOSCOPIC GASTROSTOMY (PEG) PLACEMENT (N/A )  Patient Location: PACU  Anesthesia Type:General  Level of Consciousness: awake, drowsy and patient cooperative  Airway & Oxygen Therapy: Patient Spontanous Breathing and Patient connected to face mask oxygen  Post-op Assessment: Report given to RN and Post -op Vital signs reviewed and stable  Post vital signs: Reviewed  Last Vitals:  Vitals Value Taken Time  BP 109/72 04/24/19 0847  Temp    Pulse 92 04/24/19 0850  Resp 11 04/24/19 0850  SpO2 100 % 04/24/19 0850  Vitals shown include unvalidated device data.  Last Pain:  Vitals:   04/24/19 0715  TempSrc: Temporal  PainSc: 0-No pain      Patients Stated Pain Goal: 0 ([phone] [id number])  Complications: No apparent anesthesia complications

## 2019-04-24 NOTE — Op Note (Signed)
[name] Patient Name: Jeremiah [name] Procedure Date: 04/24/2019 7:22 AM MRN: [id number] Account #: [id number] Date of Birth: [birth year] Admit Type: Inpatient Age: 78 Room: [name] 3 Gender: Male Note Status: Finalized Procedure:             Upper GI endoscopy Indications:           Place PEG due to dysphagia, Place PEG due to impaired                         swallowing Providers:             Benay Pike. Alice Reichert MD, MD Referring MD:          Caprice Renshaw MD (Referring MD) Medicines:             General Anesthesia Complications:         No immediate complications. Estimated blood loss:                         Minimal. Procedure:             Pre-Anesthesia Assessment:                        - The risks and benefits of the procedure and the                         sedation options and risks were discussed with the                         patient. All questions were answered and informed                         consent was obtained.                        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 The                         alternatives, risks and benefits of the procedure were                         discussed at length with the patient's spouse. The                         patient's proxy verbalized understanding of the risks                         as well as the alternatives and wished to proceed with                         the procedure.                        - ASA Grade Assessment: IV - A patient with severe  systemic disease that is a constant threat to life.                        - After reviewing the risks and benefits, the patient                         was deemed in satisfactory condition to undergo the                         procedure.                        After obtaining informed consent, the endoscope was                    passed under direct vision. Throughout the procedure,                         the patient's blood pressure, pulse, and oxygen                         saturations were monitored continuously. The Endoscope                         was introduced through the mouth, and advanced to the                         third part of duodenum. The upper GI endoscopy was                         somewhat difficult due to unusual anatomy. Successful                         completion of the procedure was aided by performing                         the maneuvers documented (below) in this report. The                         patient tolerated the procedure well. Findings:      The esophagus was normal.      J-shaped stomach      The examined duodenum was normal.      The patient was placed in the supine position for PEG placement. The       stomach was insufflated to appose gastric and abdominal walls. A site       was located in the body of the stomach with good transillumination and       manual external pressure for placement. The abdominal wall was marked       and prepped in a sterile manner. The area was anesthetized with 3 mL of       1% lidocaine. The trocar needle was introduced through the abdominal       wall and into the stomach under direct endoscopic view. A snare was       introduced through the endoscope and opened in the gastric lumen. The       guide wire was passed through the trocar and into the open snare. The       snare was closed around  the guide wire. The endoscope and snare were       removed, pulling the wire out through the mouth. A skin incision was       made at the site of needle insertion. The externally removable 20 Fr       EndoVive Safety gastrostomy tube was lubricated. The G-tube was tied to       the guide wire and pulled through the mouth and into the stomach. The       trocar needle was removed, and the gastrostomy tube was pulled out from       the  stomach through the skin. The external bumper was attached to the       gastrostomy tube, and the tube was cut to remove the guide wire. The       final position of the gastrostomy tube was confirmed by relook       endoscopy, and skin marking noted to be 3.5 cm at the external bumper.       The final tension and compression of the abdominal wall by the PEG tube       and external bumper were checked and revealed that the bumper was       moderately tight and mildly deforming the skin. The feeding tube was       capped, and the tube site cleaned and dressed. Estimated blood loss was       minimal.      The exam was otherwise without abnormality. Impression:            - Normal esophagus.                        - Normal examined duodenum.                        - The examination was otherwise normal.                        - An externally removable PEG placement was                         successfully completed.                        - No specimens collected. Recommendation:        - Please follow the post-PEG recommendations                         including: Nutrition consult for formula and volume,                         change dressing once per day, NPO x4 hrs then water                         today, may use PEG today for meds and water, may use                         PEG tomorrow for feedings, antibiotic ointment to site                         and clean site with soap and water daily and dry  thoroughly.                        - Return patient to [name] for ongoing care. Procedure Code(s):     --- Professional ---                        [phone], Esophagogastroduodenoscopy, flexible,                         transoral; with directed placement of percutaneous                         gastrostomy tube Diagnosis Code(s):     --- Professional ---                        Z43.1, Encounter for attention to gastrostomy                        R13.10, Dysphagia,  unspecified CPT copyright 2019 American Medical Association. All rights reserved. The codes documented in this report are preliminary and upon coder review may  be revised to meet current compliance requirements. Efrain Sella MD, MD 04/24/2019 8:40:33 AM This report has been signed electronically. Number of Addenda: 0 Note Initiated On: 04/24/2019 7:22 AM Estimated Blood Loss:  Estimated blood loss was minimal.      [name]

## 2019-04-24 NOTE — Progress Notes (Signed)
Carl. Spoke with Lorriane Shire and she reports that they received the clinical. She transferred the call to [place], RN to discuss the carve out of the Enbrel injection. Langley Gauss reports that the approval is still pending and she will add a note about the carve out of the injection and they will notify the facility if pt has been approved. Will continue to f/u to assist with the D/C plan.

## 2019-04-24 NOTE — Anesthesia Procedure Notes (Signed)
Procedure Name: Intubation Date/Time: 04/24/2019 8:06 AM Performed by: Lowry Bowl, CRNA Pre-anesthesia Checklist: Patient identified, Emergency Drugs available, Suction available and Patient being monitored Patient Re-evaluated:Patient Re-evaluated prior to induction Oxygen Delivery Method: Circle system utilized Preoxygenation: Pre-oxygenation with 100% oxygen Induction Type: IV induction, Cricoid Pressure applied and Rapid sequence Ventilation: Mask ventilation without difficulty Laryngoscope Size: McGraph and 4 Grade View: Grade I Tube type: Oral Tube size: 7.0 mm Number of attempts: 1 Airway Equipment and Method: Stylet and Video-laryngoscopy Placement Confirmation: ETT inserted through vocal cords under direct vision,  positive ETCO2 and breath sounds checked- equal and bilateral Secured at: 22 cm Tube secured with: Tape Dental Injury: Teeth and Oropharynx as per pre-operative assessment  Difficulty Due To: Difficulty was anticipated Comments: Pt had prior injury to left lower lip; no injury to lips or oropharnx

## 2019-04-24 NOTE — Interval H&P Note (Signed)
History and Physical Interval Note:  04/24/2019 7:33 AM  [name].  has presented today for surgery, with the diagnosis of Orophayngeal dysphagia, malnutrition, dementia.  The various methods of treatment have been discussed with the patient and family. After consideration of risks, benefits and other options for treatment, the patient has consented to  Procedure(s): PERCUTANEOUS ENDOSCOPIC GASTROSTOMY (PEG) PLACEMENT (N/A) as a surgical intervention.  The patient's history has been reviewed, patient examined, no change in status, stable for surgery.  I have reviewed the patient's chart and labs.  Questions were answered to the patient's satisfaction.     [place], Counce

## 2019-04-24 NOTE — Consult Note (Signed)
PHARMACY CONSULT NOTE - FOLLOW UP  Pharmacy Consult for Electrolyte Monitoring and Replacement   Recent Labs: Potassium (mmol/L)  Date Value  04/24/2019 3.4 (L)   Magnesium (mg/dL)  Date Value  04/24/2019 2.0   Calcium (mg/dL)  Date Value  04/24/2019 8.1 (L)   Albumin (g/dL)  Date Value  04/01/2019 2.8 (L)  01/14/2017 3.8   Sodium (mmol/L)  Date Value  04/24/2019 146 (H)  01/14/2017 133 (L)   Corrected Ca: 9.06 mg/dL  Assessment: 78 y/o Caucasian male with a PMH of chronic lymphedema, RA on methotrexate, seizure disorder, dementia admitted for bilateral lower extremity cellulitis since treated with a full course of antibiotics. His sodium has been trending up due to inadequate oral intake. Fluids containing KCl have been discontinued following PEG tube placement  Goal of Therapy:  Electrolytes WNL  Plan:   Sodium continues to be slightly elevated: free water flushes have been started with tube feeds  40 mEq oral KCl x 1  BMP in am  Dallie Piles ,PharmD Clinical Pharmacist 04/24/2019 8:01 AM

## 2019-04-25 LAB — BASIC METABOLIC PANEL
Anion gap: 9 (ref 5–15)
BUN: 9 mg/dL (ref 8–23)
CO2: 21 mmol/L — ABNORMAL LOW (ref 22–32)
Calcium: 8.3 mg/dL — ABNORMAL LOW (ref 8.9–10.3)
Chloride: 112 mmol/L — ABNORMAL HIGH (ref 98–111)
Creatinine, Ser: 0.77 mg/dL (ref 0.61–1.24)
GFR calc Af Amer: >60 mL/min (ref >60)
GFR calc non Af Amer: >60 mL/min (ref >60)
Glucose, Bld: 84 mg/dL (ref 70–99)
Potassium: 3.6 mmol/L (ref 3.5–5.1)
Sodium: 142 mmol/L (ref 135–145)

## 2019-04-25 MED ORDER — POTASSIUM CHLORIDE 20 MEQ PO PACK
40.0000 meq | PACK | Freq: Once | ORAL | Status: AC
  Administered 2019-04-25: 40 meq
  Filled 2019-04-25: qty 2

## 2019-04-25 NOTE — Consult Note (Signed)
PHARMACY CONSULT NOTE - FOLLOW UP  Pharmacy Consult for Electrolyte Monitoring and Replacement   Recent Labs: Potassium (mmol/L)  Date Value  04/25/2019 3.6   Magnesium (mg/dL)  Date Value  04/24/2019 2.0   Calcium (mg/dL)  Date Value  04/25/2019 8.3 (L)   Albumin (g/dL)  Date Value  04/01/2019 2.8 (L)  01/14/2017 3.8   Sodium (mmol/L)  Date Value  04/25/2019 142  01/14/2017 133 (L)   Corrected Ca: 9.06 mg/dL  Assessment: 78 y/o Caucasian male with a PMH of chronic lymphedema, RA on methotrexate, seizure disorder, dementia admitted for bilateral lower extremity cellulitis since treated with a full course of antibiotics. His sodium has been trending up due to inadequate oral intake. Fluids containing KCl have been discontinued following PEG tube placement  Goal of Therapy:  Electrolytes WNL  Plan:   Sodium continues to be slightly elevated: free water flushes have been started with tube feeds  40 mEq oral KCl x 1  BMP in am  Oswald Hillock ,PharmD Clinical Pharmacist 04/25/2019 9:10 AM

## 2019-04-25 NOTE — Progress Notes (Signed)
[facility] [place] NAME: [name]    MR#:  [id number]  DATE OF BIRTH:  04-Sep-1940  SUBJECTIVE:  patient's wife is in the room.  Wife feels patient is back to his baseline. He is talkative!! Peg tube feeding started REVIEW OF SYSTEMS:   Review of Systems  Unable to perform ROS: Medical condition   Tolerating Diet: PEG feeding Tolerating RD:EYCXK  DRUG ALLERGIES:   Allergies  Allergen Reactions  . Cefoperazone Anaphylaxis  . Penicillins Anaphylaxis and Other (See Comments)    Has patient had a PCN reaction causing immediate rash, facial/tongue/throat swelling, SOB or lightheadedness with hypotension: Yes Has patient had a PCN reaction causing severe rash involving mucus membranes or skin necrosis: No Has patient had a PCN reaction that required hospitalization No Has patient had a PCN reaction occurring within the last 10 years: No If all of the above answers are "NO", then may proceed with Cephalosporin use.  . Latex Rash    VITALS:  Blood pressure 136/75, pulse 100, temperature 97.9 F (36.6 C), resp. rate 20, height 6' (1.829 m), weight 104.3 kg, SpO2 96 %.  PHYSICAL EXAMINATION:   Physical Exam  GENERAL:  78 y.o.-year-old patient lying in the bed with no acute distress. Appears calm more awake  HEENT: Head atraumatic, normocephalic. Oropharynx and nasopharynx clear. Mucosa dry NECK:  Supple, no jugular venous distention. No thyroid enlargement, no tenderness.  LUNGS: Normal breath sounds bilaterally, no wheezing, rales, rhonchi. No use of accessory muscles of respiration.  CARDIOVASCULAR: S1, S2 normal. No murmurs, rubs, or gallops.  ABDOMEN: Soft, nontender, nondistended. Bowel sounds present. No organomegaly or mass. PEG+ EXTREMITIES: No cyanosis, clubbing  -chronic venous stasis with +edema b/l.    NEUROLOGIC: grolssly nonfocal moves all extremities  PSYCHIATRIC:  patient is alert and calm-- communicating much  better. More alert  LABORATORY PANEL:  CBC Recent Labs  Lab 04/24/19 0459  WBC 15.2*  HGB 8.6*  HCT 26.6*  PLT 570*    Chemistries  Recent Labs  Lab 04/24/19 0458 04/25/19 0741  NA 146* 142  K 3.4* 3.6  CL 116* 112*  CO2 22 21*  GLUCOSE 97 84  BUN 10 9  CREATININE 0.74 0.77  CALCIUM 8.1* 8.3*  MG 2.0  --    Cardiac Enzymes No results for input(s): TROPONINI in the last 168 hours. RADIOLOGY:  No results found. ASSESSMENT AND PLAN:  78 year old man PMH including chronic lymphedema, rheumatoid arthritis, seizure disorder, presented with worsening leg infection.  Treated in [place] ED for cellulitis 11/5, discharged on Cipro and clindamycin.  Admitted for bilateral lower extremity cellulitis, failed outpatient therapy with acute kidney injury.  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  WBC and platelet counts have recovered. --followed by Dr. Mike Gip, she feels this is likely related to methotrexate and not myelodysplastic disorder,her opinion is that the prognosis would be 5 to 10 years. -Counts remain stable.  Acute metabolic encephalopathy with intermittent confusion, most likely delirium, superimposed on cognitive decline in the setting of dementia.  CT head was limited but showed no acute abnormalities.  Seen by neurology.  MRI brain was unremarkable.  EEG showed no evidence of seizure. -Patient is more awake alert and converses now  Dysphagia. --Dysphagia 1 diet, nectar thick liquid recommended 12/31 -discussed with patient's wife Mrs. Chura at bedside.  -Status post peg tube placement  on 04/24/2019 by Dr. Alice Reichert -patient feeding started from today. Patient tolerating it well.  Chronic anemia. --Has remained stable.   -Continue folic acid supplementation per hematology.  Leukocytosis secondary to Granix. --Trending  appropriately.    Rheumatoid arthritis on immunosuppressive therapy  -d/cmethotrexate) -patient is on hydroxychloroquine and weekly shot off Enbrel. His rheumatologist is at [facility].  - Dr. Mike Gip has sent an email to patient's rheumatologist to see if immunosuppressive therapy can be started  Seizure disorder --No recurrent seizures.  Lamictal stopped given n.p.o. status.  Discussed with neurology, substituted with IV Vimpat--now changed back to lamictal 250 mg bid per Neuro rec.  Neuropathy --Gabapentin discontinued given encephalopathy.  Chronic lymphedema --No further evaluation suggested.  Pressure ulcers present on admission buttock and sacrum, stage II sacral wound. -- Barrier cream twice daily and PRN soilage.   Patient will need to be discharged to rehab.  Wife is in agreement with it.   I have informed social worker that patient will discharged on Monday if everything works out well with his feeding. -I have ordered repeat COVID for Sunday.  Family communication : with wife in the room-- appreciates the care Consults : hematology, ID, neurology, GI Discharge Disposition :SNF CODE STATUS: FULL- DVT Prophylaxis : heparin   TOTAL TIME TAKING CARE OF THIS PATIENT: *20* minutes.  >50% time spent on counselling and coordination of care  POSSIBLE D/C IN ** few* DAYS, DEPENDING ON CLINICAL CONDITION.  Note: This dictation was prepared with Dragon dictation along with smaller phrase technology. Any transcriptional errors that result from this process are unintentional.  [name] M.D on 04/25/2019 at 2:41 PM  Between 7am to 6pm - Pager - [phone]  After 6pm go to [email or web address]  [facility]   CC: Primary care physician; Derinda Late, MDPatient ID: Jeremiah Royals., male   DOB: 22-Nov-1940, 78 y.o.   MRN: [id number]

## 2019-04-26 LAB — MAGNESIUM: Magnesium: 2 mg/dL (ref 1.7–2.4)

## 2019-04-26 LAB — PHOSPHORUS: Phosphorus: 2.6 mg/dL (ref 2.5–4.6)

## 2019-04-26 LAB — POTASSIUM: Potassium: 3.8 mmol/L (ref 3.5–5.1)

## 2019-04-26 LAB — SARS CORONAVIRUS 2 (TAT 6-24 HRS): SARS Coronavirus 2: NEGATIVE

## 2019-04-26 MED ORDER — OSMOLITE 1.5 CAL PO LIQD
1000.0000 mL | ORAL | Status: DC
  Administered 2019-04-26: 1000 mL

## 2019-04-26 NOTE — Consult Note (Signed)
PHARMACY CONSULT NOTE - FOLLOW UP  Pharmacy Consult for Electrolyte Monitoring and Replacement   Recent Labs: Potassium (mmol/L)  Date Value  04/26/2019 3.8   Magnesium (mg/dL)  Date Value  04/26/2019 2.0   Calcium (mg/dL)  Date Value  04/25/2019 8.3 (L)   Albumin (g/dL)  Date Value  04/01/2019 2.8 (L)  01/14/2017 3.8   Phosphorus (mg/dL)  Date Value  04/26/2019 2.6   Sodium (mmol/L)  Date Value  04/25/2019 142  01/14/2017 133 (L)   Corrected Ca: 9.06 mg/dL  Assessment: 78 y/o Caucasian male with a PMH of chronic lymphedema, RA on methotrexate, seizure disorder, dementia admitted for bilateral lower extremity cellulitis since treated with a full course of antibiotics. His sodium has been trending up due to inadequate oral intake. Fluids containing KCl have been discontinued following PEG tube placement  Goal of Therapy:  Electrolytes WNL  Plan:   No replacement needed at this time.   BMP in am  Oswald Hillock ,PharmD, BCPS Clinical Pharmacist 04/26/2019 7:58 AM

## 2019-04-26 NOTE — Progress Notes (Signed)
[facility] [place] NAME: [name]    MR#:  [id number]  DATE OF BIRTH:  29-Nov-1940  SUBJECTIVE:  resting quietly. No new issues per RN. Peg tube feeding started-- tolerating well REVIEW OF SYSTEMS:   Review of Systems  Unable to perform ROS: Medical condition   Tolerating Diet: PEG feeding Tolerating [place]:HCWCB  DRUG ALLERGIES:   Allergies  Allergen Reactions  . Cefoperazone Anaphylaxis  . Penicillins Anaphylaxis and Other (See Comments)    Has patient had a PCN reaction causing immediate rash, facial/tongue/throat swelling, SOB or lightheadedness with hypotension: Yes Has patient had a PCN reaction causing severe rash involving mucus membranes or skin necrosis: No Has patient had a PCN reaction that required hospitalization No Has patient had a PCN reaction occurring within the last 10 years: No If all of the above answers are "NO", then may proceed with Cephalosporin use.  . Latex Rash    VITALS:  Blood pressure 115/81, pulse 88, temperature 98.2 F (36.8 C), resp. rate 18, height 6' (1.829 m), weight 104.3 kg, SpO2 100 %.  PHYSICAL EXAMINATION:   Physical Exam  GENERAL:  78 y.o.-year-old patient lying in the bed with no acute distress. Appears calm more awake  HEENT: Head atraumatic, normocephalic. Oropharynx and nasopharynx clear. Mucosa dry NECK:  Supple, no jugular venous distention. No thyroid enlargement, no tenderness.  LUNGS: Normal breath sounds bilaterally, no wheezing, rales, rhonchi. No use of accessory muscles of respiration.  CARDIOVASCULAR: S1, S2 normal. No murmurs, rubs, or gallops.  ABDOMEN: Soft, nontender, nondistended. Bowel sounds present. No organomegaly or mass. PEG+ EXTREMITIES: No cyanosis, clubbing  -chronic venous stasis with +edema b/l.    NEUROLOGIC: grolssly nonfocal moves all extremities  PSYCHIATRIC:  patient is alert and calm-- communicating much better. More alert  LABORATORY PANEL:   CBC Recent Labs  Lab 04/24/19 0459  WBC 15.2*  HGB 8.6*  HCT 26.6*  PLT 570*    Chemistries  Recent Labs  Lab 04/25/19 0741 04/26/19 0452  NA 142  --   K 3.6 3.8  CL 112*  --   CO2 21*  --   GLUCOSE 84  --   BUN 9  --   CREATININE 0.77  --   CALCIUM 8.3*  --   MG  --  2.0   Cardiac Enzymes No results for input(s): TROPONINI in the last 168 hours. RADIOLOGY:  No results found. ASSESSMENT AND PLAN:  78 year old man PMH including chronic lymphedema, rheumatoid arthritis, seizure disorder, presented with worsening leg infection.  Treated in [place] ED for cellulitis 11/5, discharged on Cipro and clindamycin.  Admitted for bilateral lower extremity cellulitis, failed outpatient therapy with acute kidney injury.  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  WBC and platelet counts have recovered. --followed by Dr. Mike Gip, she feels this is likely related to methotrexate and not myelodysplastic disorder,her opinion is that the prognosis would be 5 to 10 years. -Counts remain stable.  Acute metabolic encephalopathy with intermittent confusion, most likely delirium, superimposed on cognitive decline in the setting of dementia.  CT head was limited but showed no acute abnormalities.  Seen by neurology.  MRI brain was unremarkable.  EEG showed no evidence of seizure. -Patient is more awake alert and converses now  Dysphagia. --Dysphagia 1 diet, nectar thick liquid recommended 12/31 -discussed with patient's wife Mrs. Loor at bedside.  -  Status post peg tube placement on 04/24/2019 by Dr. Alice Reichert -patient feeding started from today. Patient tolerating it well.  Chronic anemia. --Has remained stable.   -Continue folic acid supplementation per hematology.  Leukocytosis secondary to Granix. --Trending appropriately.    Rheumatoid arthritis on  immunosuppressive therapy  -d/cmethotrexate) -patient is on hydroxychloroquine and weekly shot off Enbrel. His rheumatologist is at [facility].  - Dr. Mike Gip has sent an email to patient's rheumatologist to see if immunosuppressive therapy can be started  Seizure disorder --No recurrent seizures.  Lamictal stopped given n.p.o. status.  Discussed with neurology, substituted with IV Vimpat--now changed back to lamictal 250 mg bid per Neuro rec.  Neuropathy --Gabapentin discontinued given encephalopathy.  Chronic lymphedema --No further evaluation suggested.  Pressure ulcers present on admission buttock and sacrum, stage II sacral wound. -- Barrier cream twice daily and PRN soilage.   Patient will need to be discharged to rehab.  Wife is in agreement with it.   I have informed social worker that patient will discharged on Monday if everything works out well with his feeding. -I have ordered repeat COVID for Sunday.  Family communication : with wife in the room-- appreciates the care Consults : hematology, ID, neurology, GI Discharge Disposition :SNF CODE STATUS: FULL- DVT Prophylaxis : heparin   TOTAL TIME TAKING CARE OF THIS PATIENT: *77* minutes.  >50% time spent on counselling and coordination of care  POSSIBLE D/C IN ** f1-2* DAYS, DEPENDING ON CLINICAL CONDITION.  Note: This dictation was prepared with Dragon dictation along with smaller phrase technology. Any transcriptional errors that result from this process are unintentional.  [name] M.D on 04/26/2019 at 12:34 PM  Between 7am to 6pm - Pager - [phone]  After 6pm go to [email or web address]  [facility]   CC: Primary care physician; Derinda Late, MDPatient ID: Jeremiah [name]., male   DOB: 03/17/1941, 78 y.o.   MRN: [id number]

## 2019-04-26 NOTE — Plan of Care (Signed)

## 2019-04-27 ENCOUNTER — Encounter: Payer: Self-pay | Admitting: Internal Medicine

## 2019-04-27 LAB — MAGNESIUM: Magnesium: 1.9 mg/dL (ref 1.7–2.4)

## 2019-04-27 LAB — POTASSIUM: Potassium: 3.8 mmol/L (ref 3.5–5.1)

## 2019-04-27 LAB — PHOSPHORUS: Phosphorus: 2.6 mg/dL (ref 2.5–4.6)

## 2019-04-27 MED ORDER — PANTOPRAZOLE SODIUM 40 MG PO TBEC: 40.0000 mg | DELAYED_RELEASE_TABLET | Freq: Every day | ORAL | 0 refills | Status: DC

## 2019-04-27 MED ORDER — FREE WATER: 150.0000 mL | 0 refills | Status: DC

## 2019-04-27 MED ORDER — TRAMADOL HCL 50 MG PO TABS
50.0000 mg | ORAL_TABLET | Freq: Three times a day (TID) | ORAL | Status: DC | PRN
  Administered 2019-04-27: 13:00:00 50 mg via ORAL
  Filled 2019-04-27: qty 1

## 2019-04-27 MED ORDER — PRO-STAT SUGAR FREE PO LIQD: 30.0000 mL | Freq: Two times a day (BID) | ORAL | 0 refills | Status: DC

## 2019-04-27 MED ORDER — METOPROLOL TARTRATE 25 MG PO TABS: 12.5000 mg | ORAL_TABLET | Freq: Two times a day (BID) | ORAL | 0 refills | Status: DC

## 2019-04-27 MED ORDER — OSMOLITE 1.5 CAL PO LIQD: 1000.0000 mL | ORAL | 0 refills | Status: DC

## 2019-04-27 MED ORDER — THIAMINE HCL 100 MG PO TABS: 100.0000 mg | ORAL_TABLET | Freq: Every day | ORAL | 0 refills | Status: DC

## 2019-04-27 NOTE — Progress Notes (Signed)
Patient ID: Jerem[name]., male   DOB: 1941-02-27, 78 y.o.   MRN: [id number] Dr Mike Gip has not got email yet from pt's Rheumatologist Dr Domenic Polite regarding his Enbrel shots and Plaquenil. Will hold above 2 meds till pt is seen by his Rheum at [place] at a later date.

## 2019-04-27 NOTE — Consult Note (Addendum)
PHARMACY CONSULT NOTE - FOLLOW UP  Pharmacy Consult for Electrolyte Monitoring and Replacement   Recent Labs: Potassium (mmol/L)  Date Value  04/27/2019 3.8   Magnesium (mg/dL)  Date Value  04/27/2019 1.9   Calcium (mg/dL)  Date Value  04/25/2019 8.3 (L)   Albumin (g/dL)  Date Value  04/01/2019 2.8 (L)  01/14/2017 3.8   Phosphorus (mg/dL)  Date Value  04/27/2019 2.6   Sodium (mmol/L)  Date Value  04/25/2019 142  01/14/2017 133 (L)   Corrected Ca: 9.06 mg/dL  Assessment: 78 y/o Caucasian male with a PMH of chronic lymphedema, seizure disorder, dementia admitted for bilateral lower extremity cellulitis since treated with a full course of antibiotics. His sodium has been trending up due to inadequate oral intake. Fluids containing KCl have been discontinued following PEG tube placement  Goal of Therapy:  Electrolytes WNL  Plan:   No replacement needed at this time.   BMP in am  Pearla Dubonnet ,PharmD Clinical Pharmacist 04/27/2019 9:04 AM

## 2019-04-27 NOTE — Care Management Important Message (Signed)
Important Message  Patient Details  Name: Jeremiah [name]. MRN: [id number] Date of Birth: 09/12/1940   Medicare Important Message Given:  Yes     [name] 04/27/2019, 12:27 PM

## 2019-04-27 NOTE — Plan of Care (Signed)
  Problem: Education: Goal: Knowledge of General Education information will improve Description: Including pain rating scale, medication(s)/side effects and non-pharmacologic comfort measures Outcome: Completed/Met   Problem: Health Behavior/Discharge Planning: Goal: Ability to manage health-related needs will improve Outcome: Completed/Met   Problem: Clinical Measurements: Goal: Ability to maintain clinical measurements within normal limits will improve Outcome: Completed/Met Goal: Will remain free from infection Outcome: Completed/Met Goal: Diagnostic test results will improve Outcome: Completed/Met Goal: Respiratory complications will improve Outcome: Completed/Met Goal: Cardiovascular complication will be avoided Outcome: Completed/Met   Problem: Activity: Goal: Risk for activity intolerance will decrease Outcome: Completed/Met   Problem: Nutrition: Goal: Adequate nutrition will be maintained Outcome: Completed/Met   Problem: Coping: Goal: Level of anxiety will decrease Outcome: Completed/Met   Problem: Elimination: Goal: Will not experience complications related to bowel motility Outcome: Completed/Met Goal: Will not experience complications related to urinary retention Outcome: Completed/Met   Problem: Pain Managment: Goal: General experience of comfort will improve Outcome: Completed/Met   Problem: Safety: Goal: Ability to remain free from injury will improve Outcome: Completed/Met   Problem: Skin Integrity: Goal: Risk for impaired skin integrity will decrease Outcome: Completed/Met   Problem: Acute Rehab PT Goals(only PT should resolve) Goal: Pt Will Go Supine/Side To Sit Outcome: Completed/Met Goal: Pt Will Go Sit To Supine/Side Outcome: Completed/Met Goal: Pt Will Transfer Bed To Chair/Chair To Bed Outcome: Completed/Met   Problem: Increased Nutrient Needs (NI-5.1) Goal: Food and/or nutrient delivery Description: Individualized approach for  food/nutrient provision. Outcome: Completed/Met

## 2019-04-27 NOTE — Discharge Summary (Signed)
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 Allergy   . Arthritis   . Hypertension   . Rheumatoid arthritis ([place])   . Seizures ([place])   . Sepsis ([place]) 05/23/2015    HOSPITAL COURSE:   78 year old man PMH including chronic lymphedema, rheumatoid arthritis, seizure disorder, presented with worsening leg infection. Treated in [name] ED for cellulitis 11/5, discharged on Cipro and clindamycin. Admitted for bilateral lower extremity cellulitis, failed outpatient therapy with acute kidney injury.  Pancytopenia,status post bone marrow biopsy November 20 - WBC and platelet counts have recovered. --followed by Dr. Mike Gip, she feels this is likely related to methotrexate and not myelodysplastic disorder,her opinion is that the prognosis would be 5 to 10 years. -Counts remain stable.  Acute metabolic encephalopathy with intermittent confusion, most likely delirium in the setting of dementia.  -CT head was limited but showed no acute abnormalities. Seen by neurology. MRI brain was unremarkable. -EEG showed no evidence of seizure. -Patient is more awake alert and converses now--per wife appears near baseline  Dysphagia. --Dysphagia 1 diet, nectar thick liquid recommended 12/31--speech to  follow at rehab -Status post peg tube placement on 04/24/2019 by Dr. Alice Reichert -patient feeding started and patient tolerating it well.  Chronic anemia. --Has remained stable.  -Continue folic acid supplementation per hematology.  Leukocytosissecondary to Granix. --Trending appropriately. -WBC 15 K  Rheumatoid arthritis on immunosuppressive therapy  -d/cmethotrexate) -patient is on hydroxychloroquine and weekly shot off Enbrel. His rheumatologist is at [facility] Dr. Mike Gip has sent an email to patient's rheumatologist to see if immunosuppressive therapy can be started--pt will need to f/u with Rheumatology for his meds. -I have NOT resumed remainder of his meds.  Seizure disorder --No recurrent seizures. -cont lamictal 250 mg bid per Neuro rec.  Neuropathy --Gabapentindiscontinuedgiven encephalopathy.  Chronic lymphedema -stable  Pressure ulcers present on admission buttock and sacrum, stage II sacral wound. -- Barrier cream twice daily and PRN soilage.   Patient will need to be discharged to rehab.  Wife is in agreement with it.   - repeat COVID is NEGative  Family communication : with wife Consults : hematology, ID, neurology, GI Discharge Disposition :SNF CODE STATUS: FULL DVT Prophylaxis : heparin  CONSULTS OBTAINED:  Treatment Team:  Catarina Hartshorn, MD Alexis Goodell, MD Efrain Sella, MD  DRUG ALLERGIES:   Allergies  Allergen Reactions  . Cefoperazone Anaphylaxis  . Penicillins Anaphylaxis and Other (See Comments)    Has patient had a PCN reaction causing immediate rash, facial/tongue/throat swelling, SOB or lightheadedness with hypotension: Yes Has patient had a PCN reaction causing severe rash involving mucus membranes or skin necrosis: No Has patient had a PCN reaction that required hospitalization No Has patient had a PCN reaction occurring within the last 10 years: No If all of the above answers are "  NO", then may  proceed with Cephalosporin use.  . Latex Rash    DISCHARGE MEDICATIONS:   Allergies as of 04/27/2019      Reactions   Cefoperazone Anaphylaxis   Penicillins Anaphylaxis, Other (See Comments)   Has patient had a PCN reaction causing immediate rash, facial/tongue/throat swelling, SOB or lightheadedness with hypotension: Yes Has patient had a PCN reaction causing severe rash involving mucus membranes or skin necrosis: No Has patient had a PCN reaction that required hospitalization No Has patient had a PCN reaction occurring within the last 10 years: No If all of the above answers are "NO", then may proceed with Cephalosporin use.   Latex Rash      Medication List    STOP taking these medications   ciprofloxacin 500 MG tablet Commonly known as: CIPRO   clindamycin 300 MG capsule Commonly known as: CLEOCIN   Enbrel SureClick 50 MG/ML injection Generic drug: etanercept   gabapentin 300 MG capsule Commonly known as: Neurontin   hydroxychloroquine 200 MG tablet Commonly known as: PLAQUENIL   methotrexate 2.5 MG tablet Commonly known as: RHEUMATREX     TAKE these medications   acetaminophen 325 MG tablet Commonly known as: TYLENOL Take 2 tablets (650 mg total) by mouth every 6 (six) hours as needed for mild pain (or Fever >/= 101). Notes to patient: As needed   aspirin 81 MG chewable tablet Chew 81 mg by mouth daily. Notes to patient: Morning 04/03/19   Cinnamon 500 MG capsule Take 500 mg by mouth 2 (two) times daily. Notes to patient: Before bed 04/02/19   feeding supplement (OSMOLITE 1.5 CAL) Liqd Place 1,000 mLs into feeding tube continuous.   feeding supplement (PRO-STAT SUGAR FREE 64) Liqd Place 30 mLs into feeding tube 2 (two) times daily between meals.   folic acid 1 MG tablet Commonly known as: FOLVITE Take 1 mg by mouth daily. Notes to patient: Morning 04/03/19   free water Soln Place 150 mLs into feeding tube every 4 (four) hours.   lamoTRIgine 200  MG tablet Commonly known as: LAMICTAL Take 200 mg by mouth 2 (two) times daily. Notes to patient: Before bed 04/02/19   lamoTRIgine 25 MG tablet Commonly known as: LAMICTAL Take 25 mg by mouth 2 (two) times daily. Notes to patient: Before bed 04/02/19   magnesium oxide 400 MG tablet Commonly known as: MAG-OX Take 400 mg by mouth daily. Notes to patient: Before bed 04/03/19   metoprolol tartrate 25 MG tablet Commonly known as: LOPRESSOR Place 0.5 tablets (12.5 mg total) into feeding tube 2 (two) times daily.   nystatin-triamcinolone ointment Commonly known as: MYCOLOG Apply 1 application topically 2 (two) times daily. Notes to patient: Before bed 04/02/19   omeprazole 40 MG capsule Commonly known as: PRILOSEC Take 40 mg by mouth daily. Notes to patient: Morning 04/03/19   pantoprazole 40 MG tablet Commonly known as: PROTONIX Take 1 tablet (40 mg total) by mouth daily.   polyethylene glycol 17 g packet Commonly known as: MIRALAX / GLYCOLAX Take 17 g by mouth daily. Notes to patient: Morning 04/03/19   thiamine 100 MG tablet Take 1 tablet (100 mg total) by mouth daily.   traZODone 50 MG tablet Commonly known as: DESYREL Take 50 mg by mouth at bedtime. Notes to patient: Before bed 04/02/19       If you experience worsening of your admission symptoms, develop shortness of breath, life threatening emergency, suicidal or homicidal thoughts you must seek medical attention immediately by calling  911 or calling your MD immediately  if symptoms less severe.  You Must read complete instructions/literature along with all the possible adverse reactions/side effects for all the Medicines you take and that have been prescribed to you. Take any new Medicines after you have completely understood and accept all the possible adverse reactions/side effects.   Please note  You were cared for by a hospitalist during your hospital stay. If you have any questions about your discharge  medications or the care you received while you were in the hospital after you are discharged, you can call the unit and asked to speak with the hospitalist on call if the hospitalist that took care of you is not available. Once you are discharged, your primary care physician will handle any further medical issues. Please note that NO REFILLS for any discharge medications will be authorized once you are discharged, as it is imperative that you return to your primary care physician (or establish a relationship with a primary care physician if you do not have one) for your aftercare needs so that they can reassess your need for medications and monitor your lab values. Today   SUBJECTIVE   Hey Doc!! I am fine! I need to speak with my wife. Overall doing well  VITAL SIGNS:  Blood pressure 120/70, pulse 99, temperature 98.7 F (37.1 C), temperature source Oral, resp. rate 18, height 6' (1.829 m), weight 104.3 kg, SpO2 100 %.  I/O:    Intake/Output Summary (Last 24 hours) at 04/27/2019 0831 Last data filed at 04/27/2019 9381 Gross per 24 hour  Intake 2755 ml  Output 165 ml  Net 2590 ml    PHYSICAL EXAMINATION:   GENERAL:  78 y.o.-year-old patient lying in the bed with no acute distress. Appears cheerful HEENT: Head atraumatic, normocephalic. Oropharynx and nasopharynx clear. Mucosa dry NECK:  Supple, no jugular venous distention. No thyroid enlargement, no tenderness.  LUNGS: Normal breath sounds bilaterally, no wheezing, rales, rhonchi. No use of accessory muscles of respiration.  CARDIOVASCULAR: S1, S2 normal. No murmurs, rubs, or gallops.  ABDOMEN: Soft, nontender, nondistended. Bowel sounds present. No organomegaly or mass. PEG+ EXTREMITIES: No cyanosis, clubbing  -chronic venous stasis with +edema b/l.    NEUROLOGIC: grossly nonfocal moves all extremities  PSYCHIATRIC:  patient is alert and calm-- communicating much better. More alert DATA REVIEW:   CBC  Recent Labs  Lab  04/24/19 0459  WBC 15.2*  HGB 8.6*  HCT 26.6*  PLT 570*    Chemistries  Recent Labs  Lab 04/25/19 0741  04/27/19 0643  NA 142  --   --   K 3.6   < > 3.8  CL 112*  --   --   CO2 21*  --   --   GLUCOSE 84  --   --   BUN 9  --   --   CREATININE 0.77  --   --   CALCIUM 8.3*  --   --   MG  --    < > 1.9   < > = values in this interval not displayed.    Microbiology Results   Recent Results (from the past 240 hour(s))  Urine culture     Status: Abnormal   Collection Time: 04/23/19  5:10 AM   Specimen: Urine, Random  Result Value Ref Range Status   Specimen Description   Final    URINE, RANDOM Performed at [facility], [address], [place], [place] [address]    Special  Requests   Final    NONE Performed at [facility], [address] [place], [place] [address]    Culture (A)  Final    <10,000 COLONIES/mL INSIGNIFICANT GROWTH Performed at [facility] [address] [place], [place] [address]    Report Status 04/24/2019 FINAL  Final  SARS CORONAVIRUS 2 (TAT 6-24 HRS) Nasopharyngeal Nasopharyngeal Swab     Status: None   Collection Time: 04/26/19 11:36 AM   Specimen: Nasopharyngeal Swab  Result Value Ref Range Status   SARS Coronavirus 2 NEGATIVE NEGATIVE Final    Comment: (NOTE) SARS-CoV-2 target nucleic acids are NOT DETECTED. The SARS-CoV-2 RNA is generally detectable in upper and lower respiratory specimens during the acute phase of infection. Negative results do not preclude SARS-CoV-2 infection, do not rule out co-infections with other pathogens, and should not be used as the sole basis for treatment or other patient management decisions. Negative results must be combined with clinical observations, patient history, and epidemiological information. The expected result is Negative. Fact Sheet for Patients: SugarRoll.be Fact Sheet for Healthcare  Providers: https://www.woods-mathews.com/ This test is not yet approved or cleared by the [place] FDA and  has been authorized for detection and/or diagnosis of SARS-CoV-2 by FDA under an Emergency Use Authorization (EUA). This EUA will remain  in effect (meaning this test can be used) for the duration of the COVID-19 declaration under Section 56 4(b)(1) of the Act, 21 U.S.C. section 360bbb-3(b)(1), unless the authorization is terminated or revoked sooner. Performed at [facility], [place] [address] [place], [place] [address]     RADIOLOGY:  No results found.   CODE STATUS:     Code Status Orders  (From admission, onward)         Start     Ordered   04/22/19 1039  Full code  Continuous     04/22/19 1038        Code Status History    Date Active Date Inactive Code Status Order ID Comments User Context   04/21/2019 1313 04/22/2019 1038 DNR [id number]  Jimmy Footman, NP Inpatient   03/31/2019 1819 04/21/2019 1313 Full Code [id number]  Lavina Hamman, MD Inpatient   02/20/2018 2220 02/24/2018 2019 Full Code [id number]  Lance Coon, MD ED   05/23/2015 2221 05/26/2015 1741 Full Code [id number]  Hower, Aaron Mose, MD ED   Advance Care Planning Activity     Pt is medically best at baseline and ok to discharge to rehab.  TOTAL TIME TAKING CARE OF THIS PATIENT: *40* minutes.    [name] M.D on 04/27/2019 at 8:31 AM  Between 7am to 6pm - Pager - [phone] After 6pm go to [email or web address] - password [email or web address]  [facility]    CC: Primary care physician; Derinda Late, MD

## 2019-04-27 NOTE — TOC Transition Note (Signed)
Transition of Care [facility]) - CM/SW Discharge Note   Patient Details  Name: Jeremiah [name]. MRN: [id number] Date of Birth: November 27, 1940  [facility]) CM/SW Contact:  Shade Flood, LCSW Phone Number: 04/27/2019, 3:37 PM   Clinical Narrative:      Pt stable for dc. Insurance authorization obtained today. Updated [facility]. DC clinical sent electronically. RN to call report. EMS to transport. There are no other TOC needs for dc.   Barriers to Discharge: Barriers Resolved   Patient Goals and CMS Choice        Discharge Placement              Patient chooses bed at: [facility] Patient to be transferred to facility by: EMS Name of family member notified: Mrs. Dirienzo Patient and family notified of of transfer: 04/27/19  Discharge Plan and Services   Discharge Planning Services: CM Consult                                 Social Determinants of Health ([place]) Interventions     Readmission Risk Interventions No flowsheet data found.

## 2019-04-27 NOTE — Discharge Instructions (Signed)
DYSPHAGIA 1 diet  Cont PEG feeding and care per protocol. Speech Therapy to follow at rehab  Pt to f/u with [facility] rheumatologist in 1-2 weeks to discuss about his RA meds

## 2019-04-30 ENCOUNTER — Encounter (HOSPITAL_COMMUNITY): Payer: Self-pay | Admitting: Hematology and Oncology

## 2019-05-25 ENCOUNTER — Ambulatory Visit: Payer: Medicare Other | Admitting: Urology

## 2019-05-28 ENCOUNTER — Telehealth: Payer: Self-pay

## 2019-05-28 ENCOUNTER — Telehealth: Payer: Self-pay | Admitting: Hematology and Oncology

## 2019-05-28 NOTE — Telephone Encounter (Signed)
  Please call patient's wife.  They need to schedule an appointment with their rheumatologist at the [facility].  I am more than happy to talk to them, but I have received no calls or emails back (sent both).  M

## 2019-05-28 NOTE — Telephone Encounter (Signed)
His wife has some questions about some medication and if you spoke with the DR at [place]. He has been without arthitis med for 2 months.

## 2019-05-28 NOTE — Telephone Encounter (Signed)
Contacted patient and informed him that he will need to make an appt. With Rheumatologist. Advised the Dr. Mike Gip has attempted to reach [facility] with no success of a returned call. Patient states he will reach out and schedule an appt. Denies any further questions or concerns.

## 2019-05-29 ENCOUNTER — Encounter: Payer: Self-pay | Admitting: Urology

## 2019-05-29 ENCOUNTER — Ambulatory Visit: Payer: Medicare Other | Admitting: Urology

## 2019-06-30 IMAGING — CT CT CERVICAL SPINE W/O CM
3 of 7 series · 11 of 33 positions shown, 13 images · non-contrast
Comparison: Head CT 05/23/2015

CLINICAL DATA: Fall with head trauma.

EXAM:
CT HEAD WITHOUT CONTRAST
CT CERVICAL SPINE WITHOUT CONTRAST
TECHNIQUE: Multidetector CT imaging of the head and cervical spine was
performed following the standard protocol without intravenous
contrast. Multiplanar CT image reconstructions of the cervical spine
were also generated.

[Series 8: sagittal bone · sagittal · 0.30mm/px · 5 of 72 slices shown]
[im 12/72  bone]
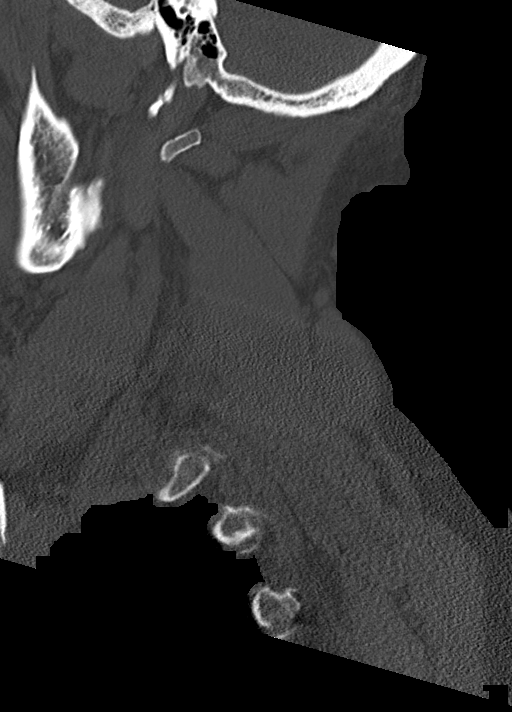
[im 24/72  bone]
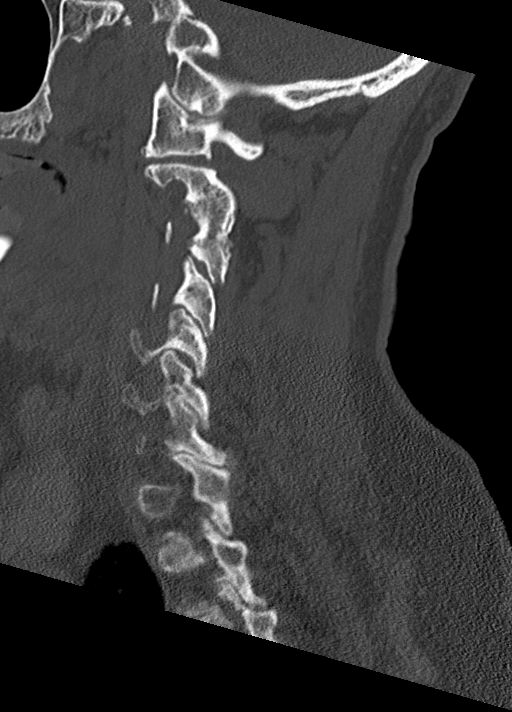
[im 36/72  bone]
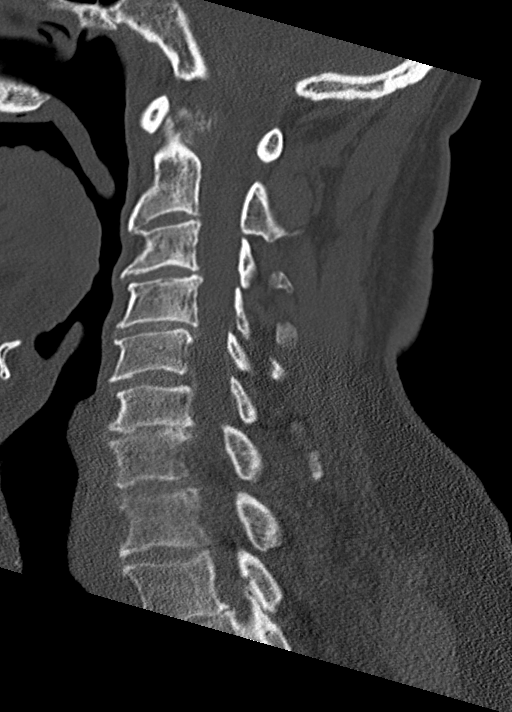
[im 48/72  bone]
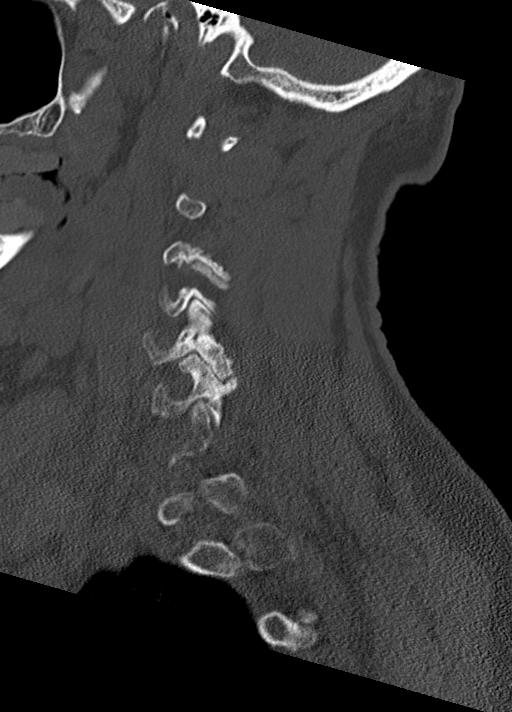
[im 60/72  bone]
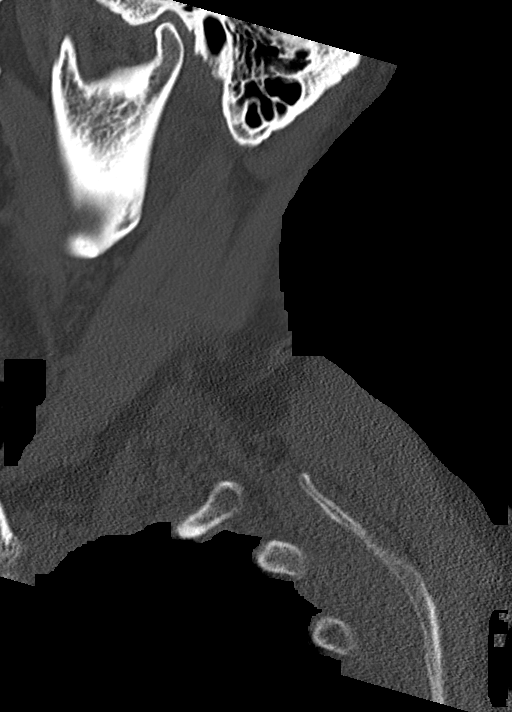

[Series 9: coronal bone · coronal · 0.35mm/px · 1 of 64 slices shown]
[im 32/64  bone]
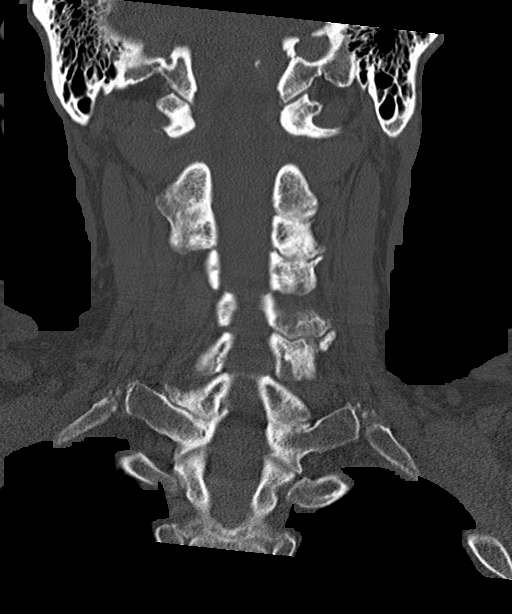

[Series 10: orthogonal bone · axial · 0.26mm/px · z∈[-315,-176]mm · 5 of 109 slices shown, 7 images]
[im 19/109  soft-tissue]
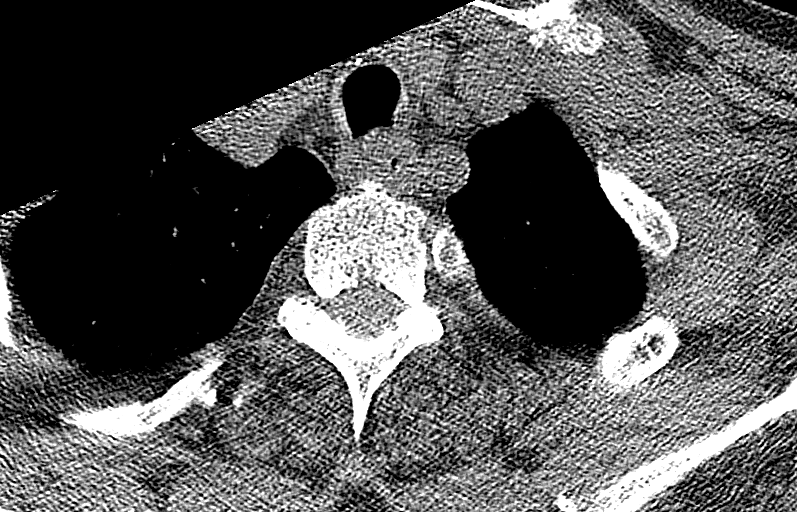
[im 19/109  bone]
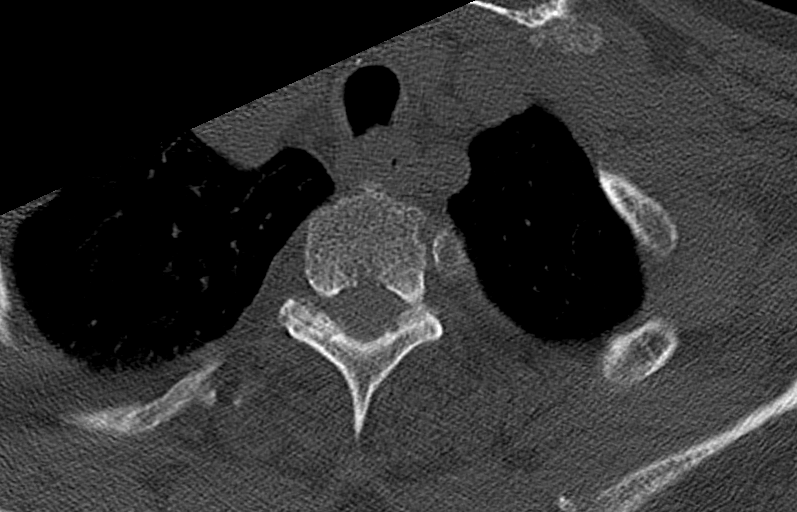
[im 37/109  bone]
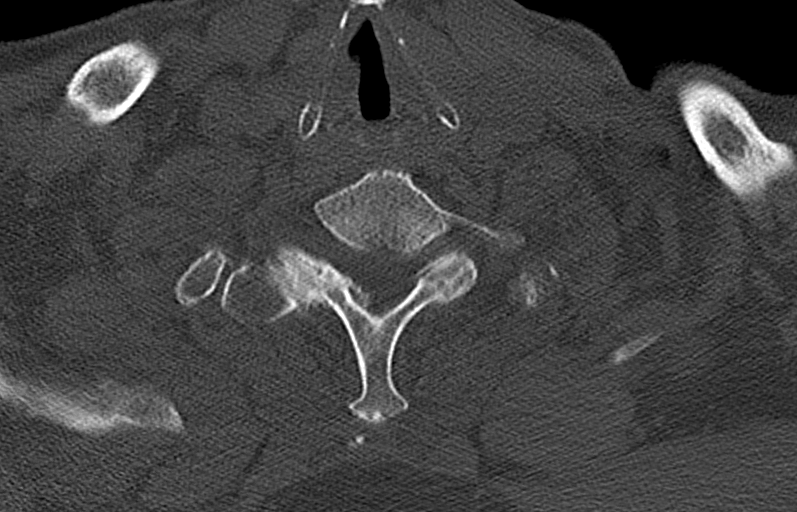
[im 55/109  bone]
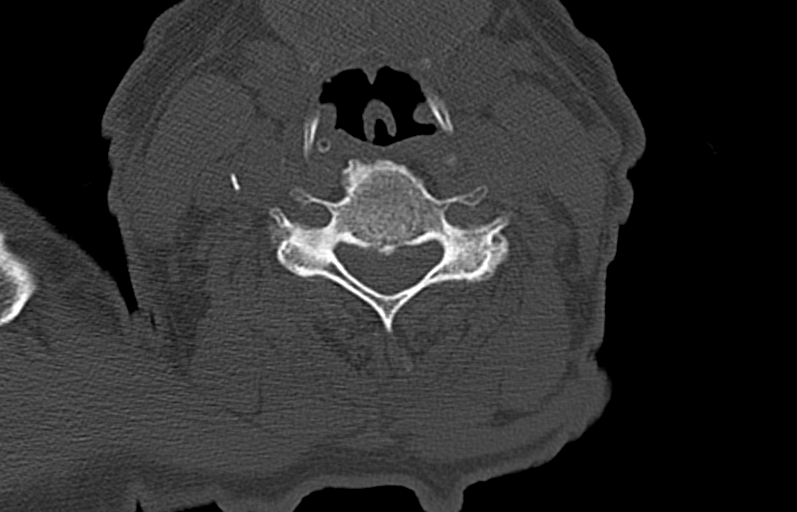
[im 73/109  bone]
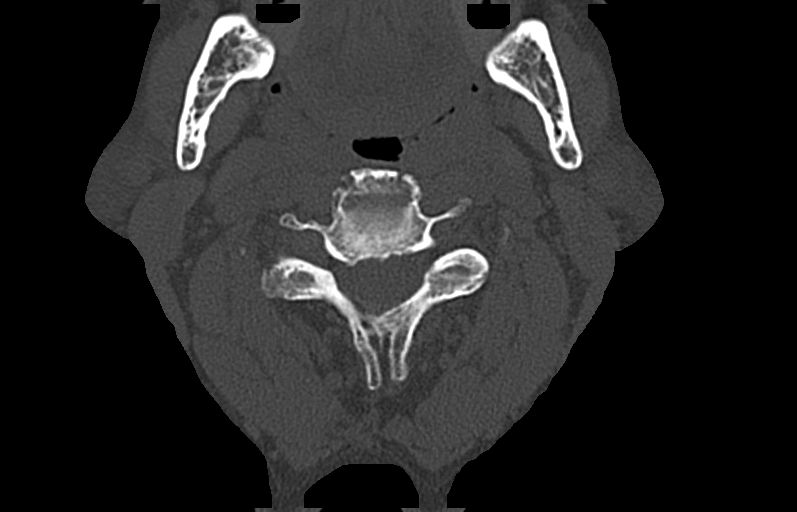
[im 91/109  soft-tissue]
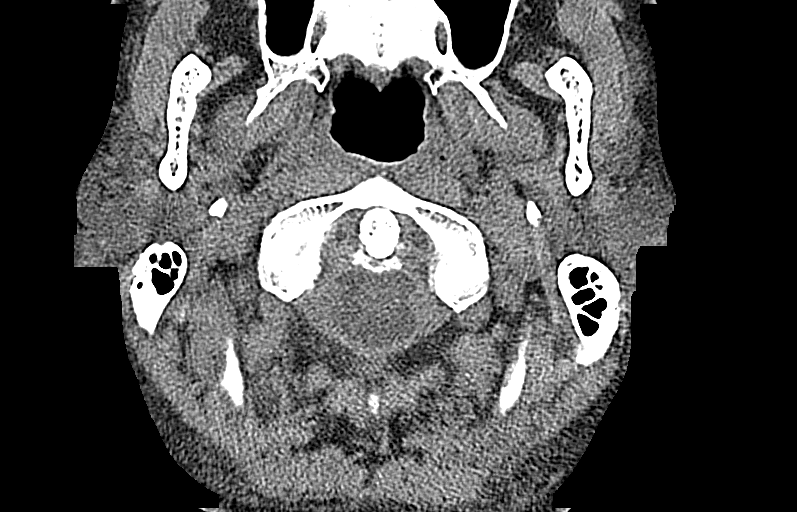
[im 91/109  bone]
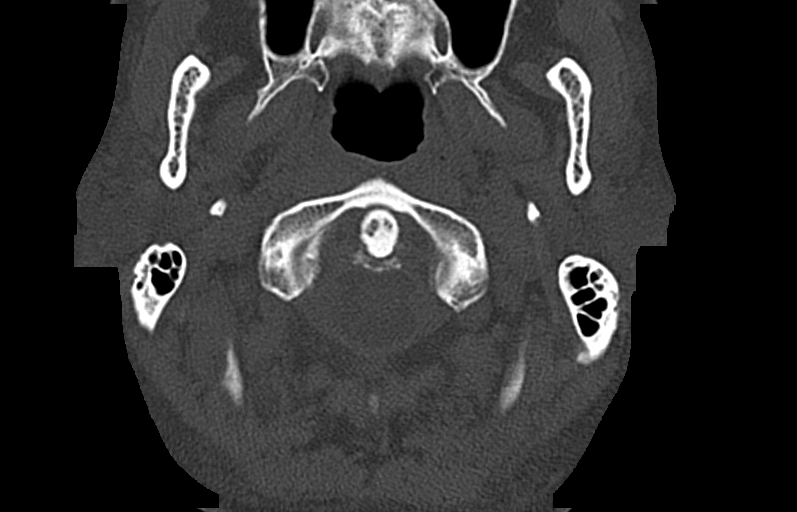

[11 of 33 positions shown; findings below may reference images not displayed]

FINDINGS: CT HEAD FINDINGS

Brain: No mass lesion, intraparenchymal hemorrhage or extra-axial
collection. No evidence of acute cortical infarct. Brain parenchyma
and CSF-containing spaces are normal for age.

Vascular: No hyperdense vessel or unexpected calcification.

Skull: Normal visualized skull base, calvarium and extracranial soft
tissues.

Sinuses/Orbits: No sinus fluid levels or advanced mucosal
thickening. No mastoid effusion. Normal orbits.

CT CERVICAL SPINE FINDINGS

Alignment: Grade 1 C4-5 retrolisthesis and grade 1 C5-6
anterolisthesis due to facet hypertrophy. Alignment is otherwise
normal.

Skull base and vertebrae: No acute fracture.

Soft tissues and spinal canal: No prevertebral fluid or swelling. No
visible canal hematoma.

Disc levels: Moderate neural foraminal stenosis bilaterally at C3-4
and C4-5 due to combination of uncovertebral and facet hypertrophy.
Moderate-to-severe left C6-7 foraminal stenosis.

Upper chest: No pneumothorax, pulmonary nodule or pleural effusion.

Other: Normal visualized paraspinal cervical soft tissues.
IMPRESSION: 1. Normal brain.
2. No acute fracture or static subluxation of the cervical spine.
3. Multilevel foraminal narrowing due to combination of
uncovertebral and facet hypertrophy.

## 2019-07-02 ENCOUNTER — Emergency Department: Payer: Medicare PPO

## 2019-07-02 ENCOUNTER — Inpatient Hospital Stay
Admission: EM | Admit: 2019-07-02 | Discharge: 2019-07-10 | DRG: 545 | Disposition: A | Discharge status: Skilled Nursing Facility | Payer: Medicare PPO | Attending: Internal Medicine | Admitting: Internal Medicine

## 2019-07-02 ENCOUNTER — Other Ambulatory Visit: Payer: Self-pay

## 2019-07-02 ENCOUNTER — Encounter: Payer: Self-pay | Admitting: Emergency Medicine

## 2019-07-02 ENCOUNTER — Telehealth: Payer: Self-pay

## 2019-07-02 DIAGNOSIS — Z87891 Personal history of nicotine dependence: Secondary | ICD-10-CM | POA: Diagnosis not present

## 2019-07-02 DIAGNOSIS — I447 Left bundle-branch block, unspecified: Secondary | ICD-10-CM | POA: Diagnosis present

## 2019-07-02 DIAGNOSIS — C9110 Chronic lymphocytic leukemia of B-cell type not having achieved remission: Secondary | ICD-10-CM | POA: Diagnosis present

## 2019-07-02 DIAGNOSIS — K219 Gastro-esophageal reflux disease without esophagitis: Secondary | ICD-10-CM | POA: Diagnosis present

## 2019-07-02 DIAGNOSIS — M79669 Pain in unspecified lower leg: Secondary | ICD-10-CM | POA: Diagnosis not present

## 2019-07-02 DIAGNOSIS — G9341 Metabolic encephalopathy: Secondary | ICD-10-CM | POA: Diagnosis present

## 2019-07-02 DIAGNOSIS — Z8616 Personal history of COVID-19: Secondary | ICD-10-CM | POA: Diagnosis not present

## 2019-07-02 DIAGNOSIS — Z880 Allergy status to penicillin: Secondary | ICD-10-CM | POA: Diagnosis not present

## 2019-07-02 DIAGNOSIS — Z91040 Latex allergy status: Secondary | ICD-10-CM | POA: Diagnosis not present

## 2019-07-02 DIAGNOSIS — D469 Myelodysplastic syndrome, unspecified: Secondary | ICD-10-CM | POA: Diagnosis not present

## 2019-07-02 DIAGNOSIS — I10 Essential (primary) hypertension: Secondary | ICD-10-CM | POA: Diagnosis not present

## 2019-07-02 DIAGNOSIS — R509 Fever, unspecified: Secondary | ICD-10-CM

## 2019-07-02 DIAGNOSIS — Z7982 Long term (current) use of aspirin: Secondary | ICD-10-CM | POA: Diagnosis not present

## 2019-07-02 DIAGNOSIS — R531 Weakness: Secondary | ICD-10-CM

## 2019-07-02 DIAGNOSIS — M5136 Other intervertebral disc degeneration, lumbar region: Secondary | ICD-10-CM | POA: Diagnosis present

## 2019-07-02 DIAGNOSIS — U071 COVID-19: Secondary | ICD-10-CM

## 2019-07-02 DIAGNOSIS — Z8249 Family history of ischemic heart disease and other diseases of the circulatory system: Secondary | ICD-10-CM

## 2019-07-02 DIAGNOSIS — M1990 Unspecified osteoarthritis, unspecified site: Secondary | ICD-10-CM | POA: Diagnosis present

## 2019-07-02 DIAGNOSIS — M0600 Rheumatoid arthritis without rheumatoid factor, unspecified site: Secondary | ICD-10-CM | POA: Diagnosis present

## 2019-07-02 DIAGNOSIS — D72820 Lymphocytosis (symptomatic): Secondary | ICD-10-CM

## 2019-07-02 DIAGNOSIS — Z66 Do not resuscitate: Secondary | ICD-10-CM | POA: Diagnosis present

## 2019-07-02 DIAGNOSIS — E1151 Type 2 diabetes mellitus with diabetic peripheral angiopathy without gangrene: Secondary | ICD-10-CM | POA: Diagnosis present

## 2019-07-02 DIAGNOSIS — G40909 Epilepsy, unspecified, not intractable, without status epilepticus: Secondary | ICD-10-CM | POA: Diagnosis present

## 2019-07-02 DIAGNOSIS — Z96651 Presence of right artificial knee joint: Secondary | ICD-10-CM | POA: Diagnosis present

## 2019-07-02 DIAGNOSIS — Z820 Family history of epilepsy and other diseases of the nervous system: Secondary | ICD-10-CM

## 2019-07-02 DIAGNOSIS — E876 Hypokalemia: Secondary | ICD-10-CM

## 2019-07-02 DIAGNOSIS — Z881 Allergy status to other antibiotic agents status: Secondary | ICD-10-CM | POA: Diagnosis not present

## 2019-07-02 DIAGNOSIS — G894 Chronic pain syndrome: Secondary | ICD-10-CM | POA: Diagnosis present

## 2019-07-02 DIAGNOSIS — G9349 Other encephalopathy: Secondary | ICD-10-CM | POA: Diagnosis not present

## 2019-07-02 DIAGNOSIS — M069 Rheumatoid arthritis, unspecified: Secondary | ICD-10-CM

## 2019-07-02 DIAGNOSIS — M25532 Pain in left wrist: Secondary | ICD-10-CM | POA: Diagnosis not present

## 2019-07-02 DIAGNOSIS — Z79899 Other long term (current) drug therapy: Secondary | ICD-10-CM | POA: Diagnosis not present

## 2019-07-02 DIAGNOSIS — Z931 Gastrostomy status: Secondary | ICD-10-CM | POA: Diagnosis not present

## 2019-07-02 DIAGNOSIS — F0390 Unspecified dementia without behavioral disturbance: Secondary | ICD-10-CM | POA: Diagnosis present

## 2019-07-02 DIAGNOSIS — G9340 Encephalopathy, unspecified: Secondary | ICD-10-CM | POA: Diagnosis present

## 2019-07-02 DIAGNOSIS — Z96641 Presence of right artificial hip joint: Secondary | ICD-10-CM | POA: Diagnosis present

## 2019-07-02 DIAGNOSIS — D61818 Other pancytopenia: Secondary | ICD-10-CM | POA: Diagnosis not present

## 2019-07-02 HISTORY — DX: Systemic involvement of connective tissue, unspecified: M35.9

## 2019-07-02 LAB — CBC WITH DIFFERENTIAL/PLATELET
Abs Immature Granulocytes: 0.04 10*3/uL (ref 0.00–0.07)
Basophils Absolute: 0 10*3/uL (ref 0.0–0.1)
Basophils Relative: 1 %
Eosinophils Absolute: 0.1 10*3/uL (ref 0.0–0.5)
Eosinophils Relative: 1 %
HCT: 37 % — ABNORMAL LOW (ref 39.0–52.0)
Hemoglobin: 11.6 g/dL — ABNORMAL LOW (ref 13.0–17.0)
Immature Granulocytes: 1 %
Lymphocytes Relative: 8 %
Lymphs Abs: 0.7 10*3/uL (ref 0.7–4.0)
MCH: 27.2 pg (ref 26.0–34.0)
MCHC: 31.4 g/dL (ref 30.0–36.0)
MCV: 86.7 fL (ref 80.0–100.0)
Monocytes Absolute: 0.5 10*3/uL (ref 0.1–1.0)
Monocytes Relative: 6 %
Neutro Abs: 7.3 10*3/uL (ref 1.7–7.7)
Neutrophils Relative %: 83 %
Platelets: 261 10*3/uL (ref 150–400)
RBC: 4.27 MIL/uL (ref 4.22–5.81)
RDW: 15.4 % (ref 11.5–15.5)
WBC: 8.7 10*3/uL (ref 4.0–10.5)
nRBC: 0 % (ref 0.0–0.2)

## 2019-07-02 LAB — COMPREHENSIVE METABOLIC PANEL
ALT: 9 U/L (ref 0–44)
AST: 16 U/L (ref 15–41)
Albumin: 3.2 g/dL — ABNORMAL LOW (ref 3.5–5.0)
Alkaline Phosphatase: 107 U/L (ref 38–126)
Anion gap: 10 (ref 5–15)
BUN: 20 mg/dL (ref 8–23)
CO2: 27 mmol/L (ref 22–32)
Calcium: 9 mg/dL (ref 8.9–10.3)
Chloride: 101 mmol/L (ref 98–111)
Creatinine, Ser: 1.16 mg/dL (ref 0.61–1.24)
GFR calc Af Amer: >60 mL/min (ref >60)
GFR calc non Af Amer: >60 mL/min (ref >60)
Glucose, Bld: 114 mg/dL — ABNORMAL HIGH (ref 70–99)
Potassium: 3.4 mmol/L — ABNORMAL LOW (ref 3.5–5.1)
Sodium: 138 mmol/L (ref 135–145)
Total Bilirubin: 1 mg/dL (ref 0.3–1.2)
Total Protein: 7.6 g/dL (ref 6.5–8.1)

## 2019-07-02 LAB — RESPIRATORY PANEL BY RT PCR (FLU A&B, COVID)
Influenza A by PCR: NEGATIVE
Influenza B by PCR: NEGATIVE
SARS Coronavirus 2 by RT PCR: POSITIVE — AB

## 2019-07-02 LAB — APTT: aPTT: 34 seconds (ref 24–36)

## 2019-07-02 LAB — POC SARS CORONAVIRUS 2 AG: SARS Coronavirus 2 Ag: NEGATIVE

## 2019-07-02 LAB — URINALYSIS, COMPLETE (UACMP) WITH MICROSCOPIC
Bacteria, UA: NONE SEEN
Bilirubin Urine: NEGATIVE
Glucose, UA: NEGATIVE mg/dL
Hgb urine dipstick: NEGATIVE
Ketones, ur: NEGATIVE mg/dL
Leukocytes,Ua: NEGATIVE
Nitrite: NEGATIVE
Protein, ur: 30 mg/dL — AB
Specific Gravity, Urine: 1.018 (ref 1.005–1.030)
Squamous Epithelial / HPF: NONE SEEN (ref 0–5)
pH: 7 (ref 5.0–8.0)

## 2019-07-02 LAB — GLUCOSE, CAPILLARY: Glucose-Capillary: 86 mg/dL (ref 70–99)

## 2019-07-02 LAB — FERRITIN: Ferritin: 102 ng/mL (ref 24–336)

## 2019-07-02 LAB — TROPONIN I (HIGH SENSITIVITY)
Troponin I (High Sensitivity): 14 ng/L (ref <18)
Troponin I (High Sensitivity): 21 ng/L — ABNORMAL HIGH (ref <18)

## 2019-07-02 LAB — LACTATE DEHYDROGENASE: LDH: 118 U/L (ref 98–192)

## 2019-07-02 LAB — LIPASE, BLOOD: Lipase: 25 U/L (ref 11–51)

## 2019-07-02 LAB — PROTIME-INR
INR: 1.1 (ref 0.8–1.2)
Prothrombin Time: 13.9 seconds (ref 11.4–15.2)

## 2019-07-02 LAB — PROCALCITONIN: Procalcitonin: 0.14 ng/mL

## 2019-07-02 LAB — ABO/RH: ABO/RH(D): A POS

## 2019-07-02 LAB — LACTIC ACID, PLASMA: Lactic Acid, Venous: 1.1 mmol/L (ref 0.5–1.9)

## 2019-07-02 LAB — FIBRIN DERIVATIVES D-DIMER (ARMC ONLY): Fibrin derivatives D-dimer (ARMC): 1842.17 ng/mL (FEU) — ABNORMAL HIGH (ref 0.00–499.00)

## 2019-07-02 MED ORDER — PANTOPRAZOLE SODIUM 40 MG PO TBEC
40.0000 mg | DELAYED_RELEASE_TABLET | Freq: Every day | ORAL | Status: DC
  Administered 2019-07-03 – 2019-07-09 (×6): 40 mg via ORAL
  Filled 2019-07-02 (×6): qty 1

## 2019-07-02 MED ORDER — LAMOTRIGINE 100 MG PO TABS
200.0000 mg | ORAL_TABLET | Freq: Two times a day (BID) | ORAL | Status: DC
  Administered 2019-07-02 – 2019-07-09 (×13): 200 mg via ORAL
  Filled 2019-07-02: qty 8
  Filled 2019-07-02 (×12): qty 2

## 2019-07-02 MED ORDER — ACETAMINOPHEN 500 MG PO TABS
1000.0000 mg | ORAL_TABLET | Freq: Once | ORAL | Status: AC
  Administered 2019-07-02: 19:00:00 1000 mg via ORAL
  Filled 2019-07-02: qty 2

## 2019-07-02 MED ORDER — ASPIRIN 81 MG PO CHEW
81.0000 mg | CHEWABLE_TABLET | Freq: Every day | ORAL | Status: DC
  Administered 2019-07-03 – 2019-07-09 (×6): 81 mg via ORAL
  Filled 2019-07-02 (×6): qty 1

## 2019-07-02 MED ORDER — INSULIN ASPART 100 UNIT/ML ~~LOC~~ SOLN: 0.0000 [IU] | Freq: Every day | SUBCUTANEOUS | Status: DC

## 2019-07-02 MED ORDER — SODIUM CHLORIDE 0.9 % IV SOLN
200.0000 mg | Freq: Once | INTRAVENOUS | Status: AC
  Administered 2019-07-02: 22:00:00 200 mg via INTRAVENOUS
  Filled 2019-07-02: qty 200

## 2019-07-02 MED ORDER — LINAGLIPTIN 5 MG PO TABS
5.0000 mg | ORAL_TABLET | Freq: Every day | ORAL | Status: DC
  Filled 2019-07-02: qty 1

## 2019-07-02 MED ORDER — PRO-STAT SUGAR FREE PO LIQD
30.0000 mL | Freq: Two times a day (BID) | ORAL | Status: DC
  Administered 2019-07-03 – 2019-07-07 (×7): 30 mL

## 2019-07-02 MED ORDER — TRAZODONE HCL 50 MG PO TABS
50.0000 mg | ORAL_TABLET | Freq: Every day | ORAL | Status: DC
  Administered 2019-07-02 – 2019-07-09 (×8): 50 mg via ORAL
  Filled 2019-07-02 (×8): qty 1

## 2019-07-02 MED ORDER — THIAMINE HCL 100 MG PO TABS
100.0000 mg | ORAL_TABLET | Freq: Every day | ORAL | Status: DC
  Administered 2019-07-03 – 2019-07-09 (×6): 100 mg via ORAL
  Filled 2019-07-02 (×6): qty 1

## 2019-07-02 MED ORDER — VITAMIN D 25 MCG (1000 UNIT) PO TABS
1000.0000 [IU] | ORAL_TABLET | Freq: Every day | ORAL | Status: DC
  Administered 2019-07-03 – 2019-07-09 (×6): 1000 [IU] via ORAL
  Filled 2019-07-02 (×6): qty 1

## 2019-07-02 MED ORDER — METOPROLOL TARTRATE 25 MG PO TABS
12.5000 mg | ORAL_TABLET | Freq: Two times a day (BID) | ORAL | Status: DC
  Administered 2019-07-02 – 2019-07-07 (×9): 12.5 mg
  Filled 2019-07-02 (×10): qty 1

## 2019-07-02 MED ORDER — OSMOLITE 1.5 CAL PO LIQD: 1000.0000 mL | ORAL | Status: DC

## 2019-07-02 MED ORDER — ZINC SULFATE 220 (50 ZN) MG PO CAPS
220.0000 mg | ORAL_CAPSULE | Freq: Every day | ORAL | Status: DC
  Administered 2019-07-03 – 2019-07-09 (×6): 220 mg via ORAL
  Filled 2019-07-02 (×6): qty 1

## 2019-07-02 MED ORDER — VANCOMYCIN HCL IN DEXTROSE 1-5 GM/200ML-% IV SOLN
1000.0000 mg | Freq: Once | INTRAVENOUS | Status: AC
  Administered 2019-07-02: 1000 mg via INTRAVENOUS
  Filled 2019-07-02: qty 200

## 2019-07-02 MED ORDER — LAMOTRIGINE 25 MG PO TABS
25.0000 mg | ORAL_TABLET | Freq: Two times a day (BID) | ORAL | Status: DC
  Administered 2019-07-02 – 2019-07-09 (×13): 25 mg via ORAL
  Filled 2019-07-02 (×15): qty 1

## 2019-07-02 MED ORDER — MAGNESIUM HYDROXIDE 400 MG/5ML PO SUSP
30.0000 mL | Freq: Every day | ORAL | Status: DC | PRN
  Filled 2019-07-02 (×2): qty 30

## 2019-07-02 MED ORDER — ONDANSETRON HCL 4 MG/2ML IJ SOLN: 4.0000 mg | Freq: Four times a day (QID) | INTRAMUSCULAR | Status: DC | PRN

## 2019-07-02 MED ORDER — MAGNESIUM OXIDE 400 (241.3 MG) MG PO TABS
400.0000 mg | ORAL_TABLET | Freq: Every day | ORAL | Status: DC
  Administered 2019-07-03 – 2019-07-09 (×6): 400 mg via ORAL
  Filled 2019-07-02 (×6): qty 1

## 2019-07-02 MED ORDER — TRAZODONE HCL 50 MG PO TABS: 25.0000 mg | ORAL_TABLET | Freq: Every evening | ORAL | Status: DC | PRN

## 2019-07-02 MED ORDER — SODIUM CHLORIDE 0.9 % IV SOLN: INTRAVENOUS | Status: DC

## 2019-07-02 MED ORDER — ACETAMINOPHEN 325 MG PO TABS
650.0000 mg | ORAL_TABLET | Freq: Four times a day (QID) | ORAL | Status: DC | PRN
  Administered 2019-07-03 – 2019-07-09 (×9): 650 mg via ORAL
  Filled 2019-07-02 (×10): qty 2

## 2019-07-02 MED ORDER — SODIUM CHLORIDE 0.9 % IV SOLN
100.0000 mg | Freq: Every day | INTRAVENOUS | Status: AC
  Administered 2019-07-03 – 2019-07-06 (×4): 100 mg via INTRAVENOUS
  Filled 2019-07-02 (×5): qty 20

## 2019-07-02 MED ORDER — FAMOTIDINE 20 MG PO TABS
20.0000 mg | ORAL_TABLET | Freq: Two times a day (BID) | ORAL | Status: DC
  Administered 2019-07-02 – 2019-07-09 (×14): 20 mg via ORAL
  Filled 2019-07-02 (×14): qty 1

## 2019-07-02 MED ORDER — POLYETHYLENE GLYCOL 3350 17 G PO PACK
17.0000 g | PACK | Freq: Every day | ORAL | Status: DC
  Administered 2019-07-03 – 2019-07-09 (×5): 17 g via ORAL
  Filled 2019-07-02 (×5): qty 1

## 2019-07-02 MED ORDER — ONDANSETRON HCL 4 MG PO TABS: 4.0000 mg | ORAL_TABLET | Freq: Four times a day (QID) | ORAL | Status: DC | PRN

## 2019-07-02 MED ORDER — INSULIN ASPART 100 UNIT/ML ~~LOC~~ SOLN
0.0000 [IU] | Freq: Three times a day (TID) | SUBCUTANEOUS | Status: DC
  Filled 2019-07-02: qty 1

## 2019-07-02 MED ORDER — ASCORBIC ACID 500 MG PO TABS
500.0000 mg | ORAL_TABLET | Freq: Every day | ORAL | Status: DC
  Administered 2019-07-03 – 2019-07-09 (×6): 500 mg via ORAL
  Filled 2019-07-02 (×5): qty 1

## 2019-07-02 MED ORDER — ENOXAPARIN SODIUM 40 MG/0.4ML ~~LOC~~ SOLN
40.0000 mg | SUBCUTANEOUS | Status: DC
  Administered 2019-07-02 – 2019-07-09 (×8): 40 mg via SUBCUTANEOUS
  Filled 2019-07-02 (×9): qty 0.4

## 2019-07-02 MED ORDER — SODIUM CHLORIDE 0.9 % IV SOLN
2.0000 g | Freq: Once | INTRAVENOUS | Status: AC
  Administered 2019-07-02: 17:00:00 2 g via INTRAVENOUS
  Filled 2019-07-02: qty 2

## 2019-07-02 MED ORDER — INSULIN DETEMIR 100 UNIT/ML ~~LOC~~ SOLN: 0.0750 [IU]/kg | Freq: Two times a day (BID) | SUBCUTANEOUS | Status: DC

## 2019-07-02 MED ORDER — METRONIDAZOLE IN NACL 5-0.79 MG/ML-% IV SOLN
500.0000 mg | Freq: Once | INTRAVENOUS | Status: AC
  Administered 2019-07-02: 18:00:00 500 mg via INTRAVENOUS
  Filled 2019-07-02: qty 100

## 2019-07-02 MED ORDER — CINNAMON 500 MG PO CAPS: 500.0000 mg | ORAL_CAPSULE | Freq: Two times a day (BID) | ORAL | Status: DC

## 2019-07-02 MED ORDER — FOLIC ACID 1 MG PO TABS
1.0000 mg | ORAL_TABLET | Freq: Every day | ORAL | Status: DC
  Administered 2019-07-03 – 2019-07-08 (×5): 1 mg via ORAL
  Filled 2019-07-02 (×5): qty 1

## 2019-07-02 NOTE — H&P (Addendum)
[facility] NAME: Jeremiah [name]    MR#:  [id number]  DATE OF BIRTH:  02-22-1941  DATE OF ADMISSION:  07/02/2019  PRIMARY CARE PHYSICIAN: Derinda Late, MD   REQUESTING/REFERRING PHYSICIAN: Brenton Grills MD CHIEF COMPLAINT:   Chief Complaint  Patient presents with  . Back Pain  Altered mental status  HISTORY OF PRESENT ILLNESS:  Jeremiah [name]  is a 79 y.o. Caucasian male with a known history of rheumatoid arthritis on methotrexate, type 2 diabetes mellitus, hypertension, PAD, GERD and seizure, who presented to the emergency room with acute onset of generalized weakness and low level of energy with associated body aches that started over the weekend and got worse since Monday.  He has been having fever and cold chills today.  He admits to dry cough over the last 2 to 3 days without dyspnea or wheezing.  His wife stated that he has been having altered mental status.  No chest pain or palpitations.  No nausea or vomiting or diarrhea.  No dysuria, oliguria or hematuria or flank pain.  He denied any loss of taste or smell.  Upon presenting to the emergency room, temperature was 102.1 with heart rate of 112 with otherwise normal vital signs.  CMP revealed mild hypokalemia of 3.4 and procalcitonin was 0.14 with lactic acid of 1.1.  CBC showed mild anemia better than previous levels.  Chest x-ray showed no acute cardiopulmonary disease. EKG showed sinus tachycardia with a rate of 111 with left bundle branch block.  His COVID-19 antigen came back negative.  PCR came back positive.  The patient was given 1 g of p.o. Tylenol, IV Azactam 2 g, IV Flagyl 500 mg and 1 g of IV vancomycin.  He will be admitted to a medical monitored bed for further evaluation and management.  PAST MEDICAL HISTORY:   Past Medical History:  Diagnosis Date  . Allergy   . Arthritis   . Hypertension   . Rheumatoid arthritis ([place])   . Seizures ([place])   . Sepsis ([place]) 05/23/2015  Type 2  diabetes mellitus Chronic low back pain Peripheral arterial disease GERD Degenerative disc disease  PAST SURGICAL HISTORY:   Past Surgical History:  Procedure Laterality Date  . BACK SURGERY    . back surgery five times  04/2016  . CARPAL TUNNEL RELEASE    . HERNIA REPAIR    . JOINT REPLACEMENT    . knee replacement right    . PEG PLACEMENT N/A 04/24/2019   Procedure: PERCUTANEOUS ENDOSCOPIC GASTROSTOMY (PEG) PLACEMENT;  Surgeon: Toledo, Benay Pike, MD;  Location: [facility] ENDOSCOPY;  Service: Gastroenterology;  Laterality: N/A;  . right hip replacement    . rotator cuff surgery    . SPINE SURGERY      SOCIAL HISTORY:   Social History   Tobacco Use  . Smoking status: Former Smoker    Packs/day: 1.00    Years: 20.00    Pack years: 20.00    Types: Cigarettes  . Smokeless tobacco: Never Used  Substance Use Topics  . Alcohol use: No    FAMILY HISTORY:   Family History  Problem Relation Age of Onset  . Hypertension Other   . Hypertension Mother   . Dementia Mother   . Dementia Father   . Prostate cancer Neg Hx   . Bladder Cancer Neg Hx   . Kidney cancer Neg Hx     DRUG ALLERGIES:   Allergies  Allergen Reactions  . Cefoperazone  Anaphylaxis  . Penicillins Anaphylaxis and Other (See Comments)    Has patient had a PCN reaction causing immediate rash, facial/tongue/throat swelling, SOB or lightheadedness with hypotension: Yes Has patient had a PCN reaction causing severe rash involving mucus membranes or skin necrosis: No Has patient had a PCN reaction that required hospitalization No Has patient had a PCN reaction occurring within the last 10 years: No If all of the above answers are "NO", then may proceed with Cephalosporin use.  . Latex Rash    REVIEW OF SYSTEMS:   As per history of present illness. All pertinent systems were reviewed above. Constitutional,  HEENT, cardiovascular, respiratory, GI, GU, musculoskeletal, neuro, psychiatric, endocrine,   integumentary and hematologic systems were reviewed and are otherwise  negative/unremarkable except for positive findings mentioned above in the HPI.   MEDICATIONS AT HOME:   Prior to Admission medications   Medication Sig Start Date End Date Taking? Authorizing Provider  acetaminophen (TYLENOL) 325 MG tablet Take 2 tablets (650 mg total) by mouth every 6 (six) hours as needed for mild pain (or Fever >/= 101). 02/24/18   Nicholes Mango, MD  Amino Acids-Protein Hydrolys (FEEDING SUPPLEMENT, PRO-STAT SUGAR FREE 64,) LIQD Place 30 mLs into feeding tube 2 (two) times daily between meals. 04/27/19   [name], MD  aspirin 81 MG chewable tablet Chew 81 mg by mouth daily.    [provider]  Cinnamon 500 MG capsule Take 500 mg by mouth 2 (two) times daily.    [provider]  folic acid (FOLVITE) 1 MG tablet Take 1 mg by mouth daily. 02/25/19   [provider]  lamoTRIgine (LAMICTAL) 200 MG tablet Take 200 mg by mouth 2 (two) times daily.    [provider]  lamoTRIgine (LAMICTAL) 25 MG tablet Take 25 mg by mouth 2 (two) times daily. 04/21/18   [provider]  magnesium oxide (MAG-OX) 400 MG tablet Take 400 mg by mouth daily.    [provider]  metoprolol tartrate (LOPRESSOR) 25 MG tablet Place 0.5 tablets (12.5 mg total) into feeding tube 2 (two) times daily. 04/27/19   [name], MD  Nutritional Supplements (FEEDING SUPPLEMENT, OSMOLITE 1.5 CAL,) LIQD Place 1,000 mLs into feeding tube continuous. 04/27/19   [name], MD  nystatin-triamcinolone ointment [facility]) Apply 1 application topically 2 (two) times daily. 03/09/19   Vaillancourt, Aldona Bar, PA-C  omeprazole (PRILOSEC) 40 MG capsule Take 40 mg by mouth daily.     [provider]  pantoprazole (PROTONIX) 40 MG tablet Take 1 tablet (40 mg total) by mouth daily. 04/27/19   [name], MD  polyethylene glycol [facility] Floria Raveling) packet Take 17 g by mouth daily. 02/25/18   Nicholes Mango,  MD  thiamine 100 MG tablet Take 1 tablet (100 mg total) by mouth daily. 04/27/19   [name], MD  traZODone (DESYREL) 50 MG tablet Take 50 mg by mouth at bedtime.    [provider]  Water For Irrigation, Sterile (FREE WATER) SOLN Place 150 mLs into feeding tube every 4 (four) hours. 04/27/19   [name], MD      VITAL SIGNS:  Blood pressure 129/73, pulse (!) 112, temperature (!) 102.1 F (38.9 C), temperature source Oral, resp. rate 12, height 6\' 1"  (1.854 m), weight 104.3 kg, SpO2 98 %.  PHYSICAL EXAMINATION:  Physical Exam  GENERAL:  79 y.o.-year-old Caucasian male patient lying in the bed with no acute distress.  EYES: Pupils equal, round, reactive to light and accommodation. No  scleral icterus. Extraocular muscles intact.  HEENT: Head atraumatic, normocephalic. Oropharynx and nasopharynx clear.  NECK:  Supple, no jugular venous distention. No thyroid enlargement, no tenderness.  LUNGS: Minimally diminished bibasal breath sounds with mild bibasal crackles.  CARDIOVASCULAR: Regular rate and rhythm, S1, S2 normal.  2/6 systolic ejection murmur at the left lower sternal border with no gallops or rubs. ABDOMEN: Soft, nondistended, nontender. Bowel sounds present. No organomegaly or mass.  EXTREMITIES: 3+ bilateral ankle and leg pitting edema no clubbing or cyanosis. NEUROLOGIC: Cranial nerves II through XII are intact. Muscle strength 5/5 in all extremities. Sensation intact. Gait not checked.  PSYCHIATRIC: The patient is alert and oriented x 3.  Normal affect and good eye contact. SKIN: No obvious rash, lesion, or ulcer.   LABORATORY PANEL:   CBC Recent Labs  Lab 07/02/19 1627  WBC 8.7  HGB 11.6*  HCT 37.0*  PLT 261   ------------------------------------------------------------------------------------------------------------------  Chemistries  Recent Labs  Lab 07/02/19 1627  NA 138  K 3.4*  CL 101  CO2 27  GLUCOSE 114*  BUN 20  CREATININE 1.16  CALCIUM  9.0  AST 16  ALT 9  ALKPHOS 107  BILITOT 1.0   ------------------------------------------------------------------------------------------------------------------  Cardiac Enzymes No results for input(s): TROPONINI in the last 168 hours. ------------------------------------------------------------------------------------------------------------------  RADIOLOGY:  DG Chest Port 1 View  Result Date: 07/02/2019 CLINICAL DATA:  Generalized pain, rheumatoid arthritis, fever EXAM: PORTABLE CHEST 1 VIEW COMPARISON:  04/20/2019 FINDINGS: Single frontal view of the chest demonstrates an unremarkable cardiac silhouette. No airspace disease, effusion, or pneumothorax. No acute bony abnormalities. Postsurgical changes right shoulder. Significant bilateral shoulder arthritis. IMPRESSION: 1. No acute intrathoracic process. Electronically Signed   By: Randa Ngo M.D.   On: 07/02/2019 17:00      IMPRESSION AND PLAN:   1.  COVID-19.  This is manifested by generalized weakness, malaise and fatigue, body aches and acute encephalopathy. -The patient will be admitted to a medical monitored bed. -We will start him on IV remdesivir. -We will place him on vitamin D3, vitamin C, zinc sulfate, p.o. Pepcid continue aspirin. -We will obtain inflammatory markers.  2.  Acute encephalopathy. -We will follow neuro check every 4 hours for 24 hours. -We will obtain head CT scan without contrast.  3.  Rheumatoid arthritis. -We will continue methotrexate and chloroquine.  4.  Type II diabetes mellitus, well controlled. -The patient will be placed on supplemental coverage with NovoLog. -His last hemoglobin A1c was 5.7 and August 2020.  5.  Seizure disorder. -We will continue Lamictal.  6.  Hypertension. -Continue Hyzaar and Lopressor.  8.  DVT prophylaxis. -Subcutaneous Lovenox   All the records are reviewed and case discussed with ED provider. The plan of care was discussed in details with the  patient (and family). I answered all questions. The patient agreed to proceed with the above mentioned plan. Further management will depend upon hospital course.   CODE STATUS: Full code  TOTAL TIME TAKING CARE OF THIS PATIENT: 55 minutes.    Christel Mormon M.D on 07/02/2019 at 8:23 PM  [facility]   From 7 PM-7 AM, contact night-coverage [email or web address]  CC: Primary care physician; Derinda Late, MD   Note: This dictation was prepared with Dragon dictation along with smaller phrase technology. Any transcriptional errors that result from this process are unintentional.

## 2019-07-02 NOTE — Telephone Encounter (Signed)
Informed Dr. Mike Gip of concerns that son , Jeremiah [name], had. It was advised that patient be seen in ED if he is febrile due to not knowing what his counts are. Contacted son and he states they are at the hospital currently with patient. Denies any other questions or concerns.

## 2019-07-02 NOTE — Progress Notes (Signed)
CODE SEPSIS - PHARMACY COMMUNICATION  **Broad Spectrum Antibiotics should be administered within 1 hour of Sepsis diagnosis**  Time Code Sepsis Called/Page Received: 1429  Antibiotics Ordered: Azactam, Vancomycin, Flagyl  Time of 1st antibiotic administration: [id number]  Additional action taken by pharmacy: none  If necessary, Name of Provider/Nurse Contacted: n/a    Pearla Dubonnet ,PharmD Clinical Pharmacist  07/02/2019  5:53 PM

## 2019-07-02 NOTE — ED Triage Notes (Signed)
Pt arrives from home via [facility] c/o generalized pain r/t dx of Rheumatoid Arthritis and fever.

## 2019-07-02 NOTE — ED Notes (Signed)
Pt c/o generalized pain r/t rheumatoid arthritis. Pt states he is unsure how long he has been w/o his RA meds.

## 2019-07-02 NOTE — Progress Notes (Signed)
PHARMACY -  BRIEF ANTIBIOTIC NOTE   Pharmacy has received consult(s) for Vancomycin and Aztreonam from an ED provider.  The patient's profile has been reviewed for ht/wt/allergies/indication/available labs.    One time order(s) placed for Vancomycin 1g and Aztreonam 2g x 1 dose each.  Further antibiotics/pharmacy consults should be ordered by admitting physician if indicated.                       Thank you, Pearla Dubonnet 07/02/2019  4:33 PM

## 2019-07-02 NOTE — Telephone Encounter (Signed)
-----   Message from [place], [place] sent at 07/02/2019  1:56 PM EST ----- Regarding: RE: advice Contact: [phone] This patient will need to follow up with his PCP for the pain and confusion ----- Message ----- From: Secundino Ginger Sent: 07/02/2019   1:48 PM EST To: Arlan Organ, RN, # Subject: advice                                         Corene Cornea (son) called and said Meer had a lot of confusion, bad pain in legs and shoulder, and 99.8 low grade fever. Also [facility] put him back on Methotrexate for arthritis. I called Hassan Rowan (Triage) she said DR Mike Gip only saw him in hospital. She asked that I sent a note to you all and ask what would be next move.

## 2019-07-02 NOTE — ED Provider Notes (Addendum)
[facility] Emergency Department Provider Note  ____________________________________________  Time seen: Approximately 6:44 PM  I have reviewed the triage vital signs and the nursing notes.   HISTORY  Chief Complaint Back Pain    Level 5 Caveat: Portions of the History and Physical including HPI and review of systems are unable to be completely obtained due to patient being a poor historian   HPI Jeremiah [name]. is a 79 y.o. male with a history of hypertension, rheumatoid arthritis, seizures who comes the ED complaining of generalized body aches and fever.  He takes weekly methotrexate for rheumatoid arthritis.  Symptoms are constant, no aggravating or alleviating factors.  Denies chest pain shortness of breath belly pain vomiting diarrhea or dysuria.  No headaches or neck pain or stiffness.      Past Medical History:  Diagnosis Date  . Allergy   . Arthritis   . Hypertension   . Rheumatoid arthritis ([place])   . Seizures ([place])   . Sepsis ([place]) 05/23/2015     Patient Active Problem List   Diagnosis Date Noted  . Dysphagia   . Oral mucositis 04/14/2019  . Neutropenia with fever ([place]) 04/11/2019  . Acute metabolic encephalopathy [id number]  . Pancytopenia ([place])   . Cellulitis of lower extremity 04/08/2019  . Folic acid deficiency   . AKI (acute kidney injury) ([place])   . Pressure injury of skin 04/05/2019  . Other pancytopenia ([place])   . Cellulitis 03/31/2019  . Edema of both legs 08/19/2018  . History of allergy to latex 07/22/2018  . Lymphedema 06/16/2018  . Chronic venous insufficiency 06/16/2018  . Venous ulcer of leg ([place]) 06/16/2018  . PAD (peripheral artery disease) ([place]) 06/16/2018  . Diabetes mellitus type 2, uncomplicated ([place]) 0000000  . 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 Obesity (BMI 30.0-34.9) 02/27/2018  . Primary osteoarthritis of left knee 02/27/2018  . Urinary retention 02/25/2018  . Rheumatoid arthritis  involving multiple sites ([place]) 02/25/2018  . Left knee pain 02/20/2018  . Neurogenic pain 10/02/2017  . Spondylosis without myelopathy or radiculopathy, lumbosacral region 07/17/2017  . Trigger point with back pain (recurrent/intermittent) 07/17/2017  . Chronic musculoskeletal pain 02/27/2017  . Myofascial pain syndrome 02/27/2017  . Long term current use of anticoagulant (Plaquenil) 02/27/2017  . Chronic lumbar radiculitis (L5 dermatome) (Right) 02/27/2017  . Chronic lumbar radiculitis (L3 dermatome) (Left) 02/27/2017  . Chronic foot pain (Right) 02/27/2017  . Chronic thoracic back pain (Bilateral) (R>L) 02/27/2017  . Chronic neck pain (posterior) (Bilateral) (R>L) 02/27/2017  . Chronic shoulder pain (Bilateral) (L>R) 02/27/2017  . 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 DDD (degenerative disc disease), lumbar 02/19/2017  . 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 Chronic knee pain (Bilateral) (R>L) 02/19/2017  . 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 Grade 1 Retrolisthesis of L1 over L2 02/19/2017  . T11 wedge compression fracture ([place]prox. 1993) 02/19/2017  . Lumbar facet arthropathy 02/19/2017  . Lumbar facet syndrome (Bilateral) 02/19/2017  . Lumbar foraminal stenosis (Bilateral: L1-2)  (Right: L3-4, L4-5) 02/19/2017  . 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 Disorder of skeletal system 02/19/2017  . Pharmacologic therapy 02/19/2017  . Problems influencing health status 02/19/2017  . Chronic pain syndrome 01/14/2017  . Gastroesophageal reflux disease 01/14/2017  . Hyperlipidemia 01/14/2017  . Lumbar radiculopathy 01/14/2017  . Postoperative anemia due to acute blood loss 01/14/2017  . Postprocedural hypotension 01/14/2017  . Chronic lower  extremity pain (Secondary Area of Pain) (Bilateral) (R>L) 01/14/2017  . Rheumatoid arthritis ([place]) 12/17/2013  . Diabetes ([place]) 12/17/2013  . Benign essential hypertension  11/13/2013  . Chronic low back pain (Primary Area of Pain) (Bilateral) (R>L) 11/13/2013  . Prostatitis 11/13/2013  . Seizure ([place]) 11/13/2013  . Left bundle branch block 05/21/2013  . Dorsalgia 05/21/2013     Past Surgical History:  Procedure Laterality Date  . BACK SURGERY    . back surgery five times  04/2016  . CARPAL TUNNEL RELEASE    . HERNIA REPAIR    . JOINT REPLACEMENT    . knee replacement right    . PEG PLACEMENT N/A 04/24/2019   Procedure: PERCUTANEOUS ENDOSCOPIC GASTROSTOMY (PEG) PLACEMENT;  Surgeon: Toledo, Benay Pike, MD;  Location: [facility] ENDOSCOPY;  Service: Gastroenterology;  Laterality: N/A;  . right hip replacement    . rotator cuff surgery    . SPINE SURGERY       Prior to Admission medications   Medication Sig Start Date End Date Taking? Authorizing Provider  acetaminophen (TYLENOL) 325 MG tablet Take 2 tablets (650 mg total) by mouth every 6 (six) hours as needed for mild pain (or Fever >/= 101). 02/24/18   Nicholes Mango, MD  Amino Acids-Protein Hydrolys (FEEDING SUPPLEMENT, PRO-STAT SUGAR FREE 64,) LIQD Place 30 mLs into feeding tube 2 (two) times daily between meals. 04/27/19   [name], MD  aspirin 81 MG chewable tablet Chew 81 mg by mouth daily.    [provider]  Cinnamon 500 MG capsule Take 500 mg by mouth 2 (two) times daily.    [provider]  folic acid (FOLVITE) 1 MG tablet Take 1 mg by mouth daily. 02/25/19   [provider]  lamoTRIgine (LAMICTAL) 200 MG tablet Take 200 mg by mouth 2 (two) times daily.    [provider]  lamoTRIgine (LAMICTAL) 25 MG tablet Take 25 mg by mouth 2 (two) times daily. 04/21/18   [provider]  magnesium oxide (MAG-OX) 400 MG tablet Take 400 mg by mouth daily.    [provider]  metoprolol tartrate (LOPRESSOR) 25 MG tablet Place 0.5 tablets (12.5 mg total) into feeding tube 2 (two) times daily. 04/27/19   [name], MD  Nutritional Supplements (FEEDING SUPPLEMENT,  OSMOLITE 1.5 CAL,) LIQD Place 1,000 mLs into feeding tube continuous. 04/27/19   [name], MD  nystatin-triamcinolone ointment [facility]) Apply 1 application topically 2 (two) times daily. 03/09/19   Vaillancourt, Aldona Bar, PA-C  omeprazole (PRILOSEC) 40 MG capsule Take 40 mg by mouth daily.     [provider]  pantoprazole (PROTONIX) 40 MG tablet Take 1 tablet (40 mg total) by mouth daily. 04/27/19   [name], MD  polyethylene glycol [facility] Floria Raveling) packet Take 17 g by mouth daily. 02/25/18   Nicholes Mango, MD  thiamine 100 MG tablet Take 1 tablet (100 mg total) by mouth daily. 04/27/19   [name], MD  traZODone (DESYREL) 50 MG tablet Take 50 mg by mouth at bedtime.    [provider]  Water For Irrigation, Sterile (FREE WATER) SOLN Place 150 mLs into feeding tube every 4 (four) hours. 04/27/19   [name], MD     Allergies Cefoperazone, Penicillins, and Latex   Family History  Problem Relation Age of Onset  . Hypertension Other   . Hypertension Mother   . Dementia Mother   . Dementia Father   . Prostate cancer Neg Hx   . Bladder Cancer Neg  Hx   . Kidney cancer Neg Hx     Social History Social History   Tobacco Use  . Smoking status: Former Smoker    Packs/day: 1.00    Years: 20.00    Pack years: 20.00    Types: Cigarettes  . Smokeless tobacco: Never Used  Substance Use Topics  . Alcohol use: No  . Drug use: Never    Review of Systems Level 5 Caveat: Portions of the History and Physical including HPI and review of systems are unable to be completely obtained due to patient being a poor historian   Constitutional: Positive fever .  ENT:   No rhinorrhea. Cardiovascular:   No chest pain or syncope. Respiratory:   No dyspnea or cough. Gastrointestinal:   Negative for abdominal pain, vomiting and diarrhea.  Musculoskeletal:   Positive body aches ____________________________________________   PHYSICAL EXAM:  VITAL SIGNS: ED Triage Vitals   Enc Vitals Group     BP 07/02/19 1610 116/74     Pulse Rate 07/02/19 1610 (!) 112     Resp 07/02/19 1610 18     Temp 07/02/19 1610 (!) 102.1 F (38.9 C)     Temp Source 07/02/19 1610 Oral     SpO2 07/02/19 1610 99 %     Weight 07/02/19 1613 230 lb (104.3 kg)     Height 07/02/19 1613 6\' 1"  (1.854 m)     Head Circumference --      Peak Flow --      Pain Score 07/02/19 1613 7     Pain Loc --      Pain Edu? --      Excl. in [place]? --     Vital signs reviewed, nursing assessments reviewed.   Constitutional:   Alert and oriented to person and place.  Ill-appearing. Eyes:   Conjunctivae are normal. EOMI. PERRL. ENT      Head:   Normocephalic and atraumatic.      Nose:   No congestion/rhinnorhea.       Mouth/Throat:   Dry mucous membranes, no pharyngeal erythema. No peritonsillar mass.       Neck:   No meningismus. Full ROM. Hematological/Lymphatic/Immunilogical:   No cervical lymphadenopathy. Cardiovascular:   Tachycardia heart rate 115. Symmetric bilateral radial and DP pulses.  No murmurs. Cap refill less than 2 seconds. Respiratory:   Normal respiratory effort without tachypnea/retractions. Breath sounds are clear and equal bilaterally. No wheezes/rales/rhonchi. Gastrointestinal:   Soft and nontender. Non distended. There is no CVA tenderness.  No rebound, rigidity, or guarding. Musculoskeletal:   Normal range of motion in all extremities. No joint effusions.  No lower extremity tenderness.  No edema. Neurologic:   Normal speech, limited language range, limited ability to maintain attention Motor grossly intact. No acute focal neurologic deficits are appreciated.  Skin:    Skin is warm, dry and intact. No rash noted.  No petechiae, purpura, or bullae.  ____________________________________________    LABS (pertinent positives/negatives) (all labs ordered are listed, but only abnormal results are displayed) Labs Reviewed  RESPIRATORY PANEL BY RT PCR (FLU A&B, COVID) - Abnormal;  Notable for the following components:      Result Value   SARS Coronavirus 2 by RT PCR POSITIVE (*)    All other components within normal limits  COMPREHENSIVE METABOLIC PANEL - Abnormal; Notable for the following components:   Potassium 3.4 (*)    Glucose, Bld 114 (*)    Albumin 3.2 (*)    All other components  within normal limits  CBC WITH DIFFERENTIAL/PLATELET - Abnormal; Notable for the following components:   Hemoglobin 11.6 (*)    HCT 37.0 (*)    All other components within normal limits  URINALYSIS, COMPLETE (UACMP) WITH MICROSCOPIC - Abnormal; Notable for the following components:   Color, Urine YELLOW (*)    APPearance CLEAR (*)    Protein, ur 30 (*)    All other components within normal limits  CULTURE, BLOOD (ROUTINE X 2)  CULTURE, BLOOD (ROUTINE X 2)  URINE CULTURE  LACTIC ACID, PLASMA  LIPASE, BLOOD  PROCALCITONIN  APTT  PROTIME-INR  POC SARS CORONAVIRUS 2 AG -  ED  POC SARS CORONAVIRUS 2 AG  TROPONIN I (HIGH SENSITIVITY)   ____________________________________________   EKG  Interpreted by me Sinus tachycardia rate 111, normal axis, left bundle branch block.  No acute ischemic changes.  ____________________________________________    RADIOLOGY  DG Chest Port 1 View  Result Date: 07/02/2019 CLINICAL DATA:  Generalized pain, rheumatoid arthritis, fever EXAM: PORTABLE CHEST 1 VIEW COMPARISON:  04/20/2019 FINDINGS: Single frontal view of the chest demonstrates an unremarkable cardiac silhouette. No airspace disease, effusion, or pneumothorax. No acute bony abnormalities. Postsurgical changes right shoulder. Significant bilateral shoulder arthritis. IMPRESSION: 1. No acute intrathoracic process. Electronically Signed   By: Randa Ngo M.D.   On: 07/02/2019 17:00    ____________________________________________   PROCEDURES .Critical Care Performed by: Carrie Mew, MD Authorized by: Carrie Mew, MD   Critical care provider statement:     Critical care time (minutes):  35   Critical care time was exclusive of:  Separately billable procedures and treating other patients   Critical care was necessary to treat or prevent imminent or life-threatening deterioration of the following conditions:  Sepsis   Critical care was time spent personally by me on the following activities:  Development of treatment plan with patient or surrogate, discussions with consultants, evaluation of patient's response to treatment, examination of patient, obtaining history from patient or surrogate, ordering and performing treatments and interventions, ordering and review of laboratory studies, ordering and review of radiographic studies, pulse oximetry, re-evaluation of patient's condition and review of old charts    ____________________________________________  DIFFERENTIAL DIAGNOSIS   Sepsis, pneumonia, UTI, dehydration, COVID-19  CLINICAL IMPRESSION / ASSESSMENT AND PLAN / ED COURSE  Medications ordered in the ED: Medications  aztreonam (AZACTAM) 2 g in sodium chloride 0.9 % 100 mL IVPB (0 g Intravenous Stopped 07/02/19 1755)  metroNIDAZOLE (FLAGYL) IVPB 500 mg (0 mg Intravenous Stopped 07/02/19 1917)  vancomycin (VANCOCIN) IVPB 1000 mg/200 mL premix (0 mg Intravenous Stopped 07/02/19 1917)  acetaminophen (TYLENOL) tablet 1,000 mg (1,000 mg Oral Given 07/02/19 1838)    Pertinent labs & imaging results that were available during my care of the patient were reviewed by me and considered in my medical decision making (see chart for details).   Jeremiah [name]. was evaluated in Emergency Department on 07/02/2019 for the symptoms described in the history of present illness. He was evaluated in the context of the global COVID-19 pandemic, which necessitated consideration that the patient might be at risk for infection with the SARS-CoV-2 virus that causes COVID-19. Institutional protocols and algorithms that pertain to the evaluation of patients at risk for  COVID-19 are in a state of rapid change based on information released by regulatory bodies including the CDC and federal and state organizations. These policies and algorithms were followed during the patient's care in the ED.   Patient presents with  fever tachycardia tachypnea, generalized weakness and body aches.  Suspicious for COVID-19/viral syndrome.  Given age and comorbidities, will start with sepsis protocol and give empiric antibiotics with aztreonam and vancomycin pending work-up.  No evidence of skin or soft tissue infection, joint infection, osteomyelitis, meningitis encephalitis.  Doubt PE ACS dissection or carditis..  Covid rapid antigen negative, PCR ordered.  Clinical Course as of Jul 01 2001  Thu Jul 02, 2019  1837 Labs reassuring, procalcitonin is low, chest x-ray and urinalysis are unremarkable.  Awaiting Covid PCR.  Will hospitalize for continued antibiotic coverage until culture data are available given the patient's age and comorbidities.   [PS]    Clinical Course User Index [PS] Carrie Mew, MD     ----------------------------------------- 6:49 PM on 07/02/2019 -----------------------------------------  Procalcitonin low, lactate normal, will discontinue sepsis protocol for now.  Sepsis reassessment has been completed.  ----------------------------------------- 8:03 PM on 07/02/2019 -----------------------------------------  Covid PCR positive.  Wife notes that over the last 4 days the patient has become progressively more weak, unable to stand or move himself at home.  Patient weighs 104 kg, difficult to manage for family.  He is also becoming increasingly confused over the last few days as well.  Plan to hospitalize for his profound generalized weakness and encephalopathy in the setting of Covid infection despite lack of pulmonary complications at this time.  ____________________________________________   FINAL CLINICAL IMPRESSION(S) / ED  DIAGNOSES    Final diagnoses:  Rheumatoid arthritis, involving unspecified site, unspecified whether rheumatoid factor present ([place])  COVID-19 virus infection  Encephalopathy acute  Generalized weakness     ED Discharge Orders    None      Portions of this note were generated with dragon dictation software. Dictation errors may occur despite best attempts at proofreading.   Carrie Mew, MD 07/02/19 Valerie Roys    Carrie Mew, MD 07/02/19 Paulette Blanch    Carrie Mew, MD 07/02/19 2004

## 2019-07-02 NOTE — Progress Notes (Signed)
Remdesivir - Pharmacy Brief Note  SARS Coronavirus 2 by RT PCR: positive   O:  ALT: 9 SpO2: 98% on Room Air    A/P:  Remdesivir 200 mg IVPB once followed by 100 mg IVPB daily x 4 days.   Pernell Dupre, PharmD, BCPS Clinical Pharmacist 07/02/2019 8:30 PM

## 2019-07-02 NOTE — Progress Notes (Signed)
PHARMACIST - PHYSICIAN ORDER COMMUNICATION  CONCERNING: P&T Medication Policy on Herbal Medications  DESCRIPTION:  This patient's order for:  Cinnamon 500 mg has been noted.  This product(s) is classified as an "herbal" or natural product. Due to a lack of definitive safety studies or FDA approval, nonstandard manufacturing practices, plus the potential risk of unknown drug-drug interactions while on inpatient medications, the Pharmacy and Therapeutics Committee does not permit the use of "herbal" or natural products of this type within [facility].   ACTION TAKEN: The pharmacy department is unable to verify this order at this time.  Please reevaluate patient's clinical condition at discharge and address if the herbal or natural product(s) should be resumed at that time.  Pernell Dupre, PharmD, BCPS Clinical Pharmacist 07/02/2019 8:31 PM

## 2019-07-03 DIAGNOSIS — E876 Hypokalemia: Secondary | ICD-10-CM

## 2019-07-03 DIAGNOSIS — R531 Weakness: Secondary | ICD-10-CM

## 2019-07-03 DIAGNOSIS — G9340 Encephalopathy, unspecified: Secondary | ICD-10-CM

## 2019-07-03 LAB — CBC WITH DIFFERENTIAL/PLATELET
Abs Immature Granulocytes: 0.03 10*3/uL (ref 0.00–0.07)
Basophils Absolute: 0.1 10*3/uL (ref 0.0–0.1)
Basophils Relative: 1 %
Eosinophils Absolute: 0.1 10*3/uL (ref 0.0–0.5)
Eosinophils Relative: 1 %
HCT: 35 % — ABNORMAL LOW (ref 39.0–52.0)
Hemoglobin: 11 g/dL — ABNORMAL LOW (ref 13.0–17.0)
Immature Granulocytes: 0 %
Lymphocytes Relative: 15 %
Lymphs Abs: 1 10*3/uL (ref 0.7–4.0)
MCH: 27.3 pg (ref 26.0–34.0)
MCHC: 31.4 g/dL (ref 30.0–36.0)
MCV: 86.8 fL (ref 80.0–100.0)
Monocytes Absolute: 0.4 10*3/uL (ref 0.1–1.0)
Monocytes Relative: 6 %
Neutro Abs: 5.2 10*3/uL (ref 1.7–7.7)
Neutrophils Relative %: 77 %
Platelets: 219 10*3/uL (ref 150–400)
RBC: 4.03 MIL/uL — ABNORMAL LOW (ref 4.22–5.81)
RDW: 15.5 % (ref 11.5–15.5)
WBC: 6.8 10*3/uL (ref 4.0–10.5)
nRBC: 0 % (ref 0.0–0.2)

## 2019-07-03 LAB — COMPREHENSIVE METABOLIC PANEL
ALT: 10 U/L (ref 0–44)
AST: 13 U/L — ABNORMAL LOW (ref 15–41)
Albumin: 2.9 g/dL — ABNORMAL LOW (ref 3.5–5.0)
Alkaline Phosphatase: 102 U/L (ref 38–126)
Anion gap: 7 (ref 5–15)
BUN: 19 mg/dL (ref 8–23)
CO2: 27 mmol/L (ref 22–32)
Calcium: 8.7 mg/dL — ABNORMAL LOW (ref 8.9–10.3)
Chloride: 105 mmol/L (ref 98–111)
Creatinine, Ser: 0.88 mg/dL (ref 0.61–1.24)
GFR calc Af Amer: >60 mL/min (ref >60)
GFR calc non Af Amer: >60 mL/min (ref >60)
Glucose, Bld: 112 mg/dL — ABNORMAL HIGH (ref 70–99)
Potassium: 3.1 mmol/L — ABNORMAL LOW (ref 3.5–5.1)
Sodium: 139 mmol/L (ref 135–145)
Total Bilirubin: 0.6 mg/dL (ref 0.3–1.2)
Total Protein: 7 g/dL (ref 6.5–8.1)

## 2019-07-03 LAB — GLUCOSE, CAPILLARY
Glucose-Capillary: 89 mg/dL (ref 70–99)
Glucose-Capillary: 78 mg/dL (ref 70–99)
Glucose-Capillary: 85 mg/dL (ref 70–99)
Glucose-Capillary: 61 mg/dL — ABNORMAL LOW (ref 70–99)
Glucose-Capillary: 104 mg/dL — ABNORMAL HIGH (ref 70–99)

## 2019-07-03 LAB — C-REACTIVE PROTEIN
CRP: 15.1 mg/dL — ABNORMAL HIGH (ref <1.0)
CRP: 18.2 mg/dL — ABNORMAL HIGH (ref <1.0)

## 2019-07-03 LAB — FIBRIN DERIVATIVES D-DIMER (ARMC ONLY): Fibrin derivatives D-dimer (ARMC): 2027.19 ng/mL (FEU) — ABNORMAL HIGH (ref 0.00–499.00)

## 2019-07-03 LAB — FERRITIN: Ferritin: 136 ng/mL (ref 24–336)

## 2019-07-03 MED ORDER — POTASSIUM CHLORIDE CRYS ER 20 MEQ PO TBCR
40.0000 meq | EXTENDED_RELEASE_TABLET | Freq: Once | ORAL | Status: AC
  Administered 2019-07-03: 15:00:00 40 meq via ORAL
  Filled 2019-07-03: qty 2

## 2019-07-03 MED ORDER — LOSARTAN POTASSIUM-HCTZ 50-12.5 MG PO TABS: 1.0000 | ORAL_TABLET | Freq: Every day | ORAL | Status: DC

## 2019-07-03 MED ORDER — HYDROCHLOROTHIAZIDE 12.5 MG PO CAPS
12.5000 mg | ORAL_CAPSULE | Freq: Every day | ORAL | Status: DC
  Administered 2019-07-03 – 2019-07-07 (×4): 12.5 mg via ORAL
  Filled 2019-07-03 (×4): qty 1

## 2019-07-03 MED ORDER — LOSARTAN POTASSIUM 50 MG PO TABS
50.0000 mg | ORAL_TABLET | Freq: Every day | ORAL | Status: DC
  Administered 2019-07-03 – 2019-07-05 (×3): 50 mg via ORAL
  Filled 2019-07-03 (×4): qty 1

## 2019-07-03 MED ORDER — HYDROXYCHLOROQUINE SULFATE 200 MG PO TABS
200.0000 mg | ORAL_TABLET | Freq: Every day | ORAL | Status: DC
  Administered 2019-07-03 – 2019-07-09 (×6): 200 mg via ORAL
  Filled 2019-07-03 (×7): qty 1

## 2019-07-03 MED ORDER — FUROSEMIDE 40 MG PO TABS
40.0000 mg | ORAL_TABLET | Freq: Every day | ORAL | Status: DC
  Administered 2019-07-03 – 2019-07-05 (×3): 40 mg via ORAL
  Filled 2019-07-03 (×3): qty 1

## 2019-07-03 NOTE — Progress Notes (Addendum)
PROGRESS NOTE    Jeremiah [name].  [id number] DOB: 1941-05-03 DOA: 07/02/2019 PCP: Derinda Late, MD    Brief Narrative:  Jeremiah [name]  is a 79 y.o. Caucasian male with a known history of rheumatoid arthritis on methotrexate, type 2 diabetes mellitus, hypertension, PAD, GERD and seizure, who presented to the emergency room with acute onset of generalized weakness and low level of energy with associated body aches that started over the weekend and got worse since Monday.  He has been having fever and cold chills today.  He admits to dry cough over the last 2 to 3 days without dyspnea or wheezing.  His wife stated that he has been having altered mental status.  No chest pain or palpitations.  No nausea or vomiting or diarrhea.  No dysuria, oliguria or hematuria or flank pain.  He denied any loss of taste or smell.  Upon presenting to the emergency room, temperature was 102.1 with heart rate of 112 with otherwise normal vital signs.  PCR positive    Consultants:   None  Procedures: None  Antimicrobials:   remdemsevir     Subjective:  T-max 102.1. Pt is awake and oriented earlier when I saw him, but nursing reports now becoming confused.  Objective: Vitals:   07/03/19 0300 07/03/19 0323 07/03/19 0500 07/03/19 0921  BP:  129/62  (!) 131/93  Pulse: 88 92 96 (!) 105  Resp: 14 18 18 14   Temp:  98.2 F (36.8 C)  98.3 F (36.8 C)  TempSrc:  Oral  Oral  SpO2: 96% 97% 97% 100%  Weight:      Height:        Intake/Output Summary (Last 24 hours) at 07/03/2019 1223 Last data filed at 07/03/2019 0930 Gross per 24 hour  Intake 1644.89 ml  Output --  Net 1644.89 ml   Filed Weights   07/02/19 1613  Weight: 104.3 kg    Examination:  General exam: Appears calm and comfortable , nad Respiratory system: scattered rales bilateral bases, no wheezing  Cardiovascular system: S1 & S2 heard, RRR. No rubs, gallops or clicks. Gastrointestinal system: Abdomen is nondistended, soft  and nontender.. Normal bowel sounds heard. Central nervous system: Alert and oriented x3 .  Grossly intact Extremities: +pitting edema.  Skin: warm, dry Psychiatry: Mood & affect appropriate for current setting.     Data Reviewed: I have personally reviewed following labs and imaging studies  CBC: Recent Labs  Lab 07/02/19 1627 07/03/19 0742  WBC 8.7 6.8  NEUTROABS 7.3 5.2  HGB 11.6* 11.0*  HCT 37.0* 35.0*  MCV 86.7 86.8  PLT 261 [id number]   Basic Metabolic Panel: Recent Labs  Lab 07/02/19 1627 07/03/19 0742  NA 138 139  K 3.4* 3.1*  CL 101 105  CO2 27 27  GLUCOSE 114* 112*  BUN 20 19  CREATININE 1.16 0.88  CALCIUM 9.0 8.7*   GFR: Estimated Creatinine Clearance: 89.2 mL/min (by C-G formula based on SCr of 0.88 mg/dL). Liver Function Tests: Recent Labs  Lab 07/02/19 1627 07/03/19 0742  AST 16 13*  ALT 9 10  ALKPHOS 107 102  BILITOT 1.0 0.6  PROT 7.6 7.0  ALBUMIN 3.2* 2.9*   Recent Labs  Lab 07/02/19 1627  LIPASE 25   No results for input(s): AMMONIA in the last 168 hours. Coagulation Profile: Recent Labs  Lab 07/02/19 1627  INR 1.1   Cardiac Enzymes: No results for input(s): CKTOTAL, CKMB, CKMBINDEX, TROPONINI in the last 168 hours. BNP (last  3 results) No results for input(s): PROBNP in the last 8760 hours. HbA1C: No results for input(s): HGBA1C in the last 72 hours. CBG: Recent Labs  Lab 07/02/19 2247 07/03/19 0734 07/03/19 1123  GLUCAP 86 89 78   Lipid Profile: No results for input(s): CHOL, HDL, LDLCALC, TRIG, CHOLHDL, LDLDIRECT in the last 72 hours. Thyroid Function Tests: No results for input(s): TSH, T4TOTAL, FREET4, T3FREE, THYROIDAB in the last 72 hours. Anemia Panel: Recent Labs    07/02/19 2240 07/03/19 0742  FERRITIN 102 136   Sepsis Labs: Recent Labs  Lab 07/02/19 1627  PROCALCITON 0.14  LATICACIDVEN 1.1    Recent Results (from the past 240 hour(s))  Blood Culture (routine x 2)     Status: None (Preliminary result)     Collection Time: 07/02/19  4:27 PM   Specimen: BLOOD  Result Value Ref Range Status   Specimen Description BLOOD Blood Culture adequate volume  Final   Special Requests   Final    BOTTLES DRAWN AEROBIC AND ANAEROBIC LEFT ANTECUBITAL   Culture   Final    NO GROWTH < 24 HOURS Performed at [facility], [address], [place], [place] [address]    Report Status PENDING  Incomplete  Respiratory Panel by RT PCR (Flu A&B, Covid) - Nasopharyngeal Swab     Status: Abnormal   Collection Time: 07/02/19  6:35 PM   Specimen: Nasopharyngeal Swab  Result Value Ref Range Status   SARS Coronavirus 2 by RT PCR POSITIVE (A) NEGATIVE Final    Comment: RESULT CALLED TO, READ BACK BY AND VERIFIED WITH: ANDREA BRYANT 07/02/19 1940 KLW (NOTE) SARS-CoV-2 target nucleic acids are DETECTED. SARS-CoV-2 RNA is generally detectable in upper respiratory specimens  during the acute phase of infection. Positive results are indicative of the presence of the identified virus, but do not rule out bacterial infection or co-infection with other pathogens not detected by the test. Clinical correlation with patient history and other diagnostic information is necessary to determine patient infection status. The expected result is Negative. Fact Sheet for Patients:  PinkCheek.be Fact Sheet for Healthcare Providers: GravelBags.it This test is not yet approved or cleared by the [place] FDA and  has been authorized for detection and/or diagnosis of SARS-CoV-2 by FDA under an Emergency Use Authorization (EUA).  This EUA will remain in effect (meaning this test can be used) for th e duration of  the COVID-19 declaration under Section 564(b)(1) of the Act, 21 U.S.C. section 360bbb-3(b)(1), unless the authorization is terminated or revoked sooner.    Influenza A by PCR NEGATIVE NEGATIVE Final   Influenza B by PCR NEGATIVE NEGATIVE Final    Comment:  (NOTE) The Xpert Xpress SARS-CoV-2/FLU/RSV assay is intended as an aid in  the diagnosis of influenza from Nasopharyngeal swab specimens and  should not be used as a sole basis for treatment. Nasal washings and  aspirates are unacceptable for Xpert Xpress SARS-CoV-2/FLU/RSV  testing. Fact Sheet for Patients: PinkCheek.be Fact Sheet for Healthcare Providers: GravelBags.it This test is not yet approved or cleared by the [place] FDA and  has been authorized for detection and/or diagnosis of SARS-CoV-2 by  FDA under an Emergency Use Authorization (EUA). This EUA will remain  in effect (meaning this test can be used) for the duration of the  Covid-19 declaration under Section 564(b)(1) of the Act, 21  U.S.C. section 360bbb-3(b)(1), unless the authorization is  terminated or revoked. Performed at [facility], [place], [place]  [address]          Radiology Studies: [facility] Chest Port 1 View  Result Date: 07/02/2019 CLINICAL DATA:  Generalized pain, rheumatoid arthritis, fever EXAM: PORTABLE CHEST 1 VIEW COMPARISON:  04/20/2019 FINDINGS: Single frontal view of the chest demonstrates an unremarkable cardiac silhouette. No airspace disease, effusion, or pneumothorax. No acute bony abnormalities. Postsurgical changes right shoulder. Significant bilateral shoulder arthritis. IMPRESSION: 1. No acute intrathoracic process. Electronically Signed   By: Randa Ngo M.D.   On: 07/02/2019 17:00        Scheduled Meds: . vitamin C  500 mg Oral Daily  . aspirin  81 mg Oral Daily  . cholecalciferol  1,000 Units Oral Daily  . enoxaparin (LOVENOX) injection  40 mg Subcutaneous Q24H  . famotidine  20 mg Oral BID  . feeding supplement (PRO-STAT SUGAR FREE 64)  30 mL Per Tube BID BM  . folic acid  1 mg Oral Daily  . furosemide  40 mg Oral Daily  . losartan  50 mg Oral Daily   And  . hydrochlorothiazide  12.5 mg  Oral Daily  . hydroxychloroquine  200 mg Oral Daily  . insulin aspart  0-5 Units Subcutaneous QHS  . insulin aspart  0-9 Units Subcutaneous TID PC & HS  . lamoTRIgine  200 mg Oral BID  . lamoTRIgine  25 mg Oral BID  . magnesium oxide  400 mg Oral Daily  . metoprolol tartrate  12.5 mg Per Tube BID  . pantoprazole  40 mg Oral Daily  . polyethylene glycol  17 g Oral Daily  . thiamine  100 mg Oral Daily  . traZODone  50 mg Oral QHS  . zinc sulfate  220 mg Oral Daily   Continuous Infusions: . sodium chloride 100 mL/hr at 07/03/19 [id number]  . remdesivir 100 mg in NS 100 mL 100 mg (07/03/19 1030)    Assessment & Plan:   Active Problems:   COVID-19   1.  COVID-19.  This is manifested by generalized weakness, malaise and fatigue, body aches and acute encephalopathy. -Continue  on IV remdesivir. -Continue on vitamin D3, vitamin C, zinc sulfate, p.o. Pepcid - continue aspirin. -We will obtain inflammatory markers.  2.  Acute encephalopathy.  Likely metabolic from infection plus underlying dementia.  Per son today, pt also has underlying dementia -We will follow neuro check every 4 hours for 24 hours. -CT of the head was limited however there was no acute abnormality.  Please see results  3.  Rheumatoid arthritis. -continue chloroquine. He was taken off Methotrexate per son. Last dose was sometime this week.   4.  Type II diabetes mellitus, well controlled. -The patient will be placed on supplemental coverage with NovoLog. -Last hemoglobin A1c was 5.7 and August 2020.  5.  Seizure disorder. -Continue Lamictal.  6.  Hypertension. -Continue Hyzaar and Lopressor.  7.Hypokalemia Replace  Monitor level   DVT prophylaxis: lovenox Code Status: Full code Family Communication: Updated son, Corene Cornea Disposition Plan: We will continue him on IV Remdesivir , likely be here couple of more days until afebrile, medically more stable, and encephlaopathy improves.       LOS: 1 day    Time spent: 45 minutes with more than 50% COC    Nolberto Hanlon, MD [facility] Pager [phone]  If 7PM-7AM, please contact night-coverage [email or web address] Password [facility] 07/03/2019, 12:23 PM

## 2019-07-04 LAB — COMPREHENSIVE METABOLIC PANEL
ALT: 8 U/L (ref 0–44)
AST: 11 U/L — ABNORMAL LOW (ref 15–41)
Albumin: 2.6 g/dL — ABNORMAL LOW (ref 3.5–5.0)
Alkaline Phosphatase: 86 U/L (ref 38–126)
Anion gap: 8 (ref 5–15)
BUN: 17 mg/dL (ref 8–23)
CO2: 25 mmol/L (ref 22–32)
Calcium: 8.5 mg/dL — ABNORMAL LOW (ref 8.9–10.3)
Chloride: 103 mmol/L (ref 98–111)
Creatinine, Ser: 0.79 mg/dL (ref 0.61–1.24)
GFR calc Af Amer: >60 mL/min (ref >60)
GFR calc non Af Amer: >60 mL/min (ref >60)
Glucose, Bld: 103 mg/dL — ABNORMAL HIGH (ref 70–99)
Potassium: 3.1 mmol/L — ABNORMAL LOW (ref 3.5–5.1)
Sodium: 136 mmol/L (ref 135–145)
Total Bilirubin: 1 mg/dL (ref 0.3–1.2)
Total Protein: 6.4 g/dL — ABNORMAL LOW (ref 6.5–8.1)

## 2019-07-04 LAB — CBC WITH DIFFERENTIAL/PLATELET
Abs Immature Granulocytes: 0.03 10*3/uL (ref 0.00–0.07)
Basophils Absolute: 0.1 10*3/uL (ref 0.0–0.1)
Basophils Relative: 1 %
Eosinophils Absolute: 0 10*3/uL (ref 0.0–0.5)
Eosinophils Relative: 1 %
HCT: 32.9 % — ABNORMAL LOW (ref 39.0–52.0)
Hemoglobin: 10.4 g/dL — ABNORMAL LOW (ref 13.0–17.0)
Immature Granulocytes: 0 %
Lymphocytes Relative: 17 %
Lymphs Abs: 1.2 10*3/uL (ref 0.7–4.0)
MCH: 26.9 pg (ref 26.0–34.0)
MCHC: 31.6 g/dL (ref 30.0–36.0)
MCV: 85.2 fL (ref 80.0–100.0)
Monocytes Absolute: 0.7 10*3/uL (ref 0.1–1.0)
Monocytes Relative: 11 %
Neutro Abs: 5 10*3/uL (ref 1.7–7.7)
Neutrophils Relative %: 70 %
Platelets: 220 10*3/uL (ref 150–400)
RBC: 3.86 MIL/uL — ABNORMAL LOW (ref 4.22–5.81)
RDW: 15.1 % (ref 11.5–15.5)
WBC: 7.1 10*3/uL (ref 4.0–10.5)
nRBC: 0 % (ref 0.0–0.2)

## 2019-07-04 LAB — C-REACTIVE PROTEIN: CRP: 20.3 mg/dL — ABNORMAL HIGH (ref <1.0)

## 2019-07-04 LAB — FERRITIN: Ferritin: 122 ng/mL (ref 24–336)

## 2019-07-04 LAB — URINE CULTURE: Culture: NO GROWTH

## 2019-07-04 LAB — FIBRIN DERIVATIVES D-DIMER (ARMC ONLY): Fibrin derivatives D-dimer (ARMC): 2102.55 ng/mL (FEU) — ABNORMAL HIGH (ref 0.00–499.00)

## 2019-07-04 LAB — MAGNESIUM: Magnesium: 1.9 mg/dL (ref 1.7–2.4)

## 2019-07-04 LAB — GLUCOSE, CAPILLARY
Glucose-Capillary: 93 mg/dL (ref 70–99)
Glucose-Capillary: 83 mg/dL (ref 70–99)
Glucose-Capillary: 78 mg/dL (ref 70–99)
Glucose-Capillary: 80 mg/dL (ref 70–99)
Glucose-Capillary: 72 mg/dL (ref 70–99)

## 2019-07-04 MED ORDER — POTASSIUM CHLORIDE CRYS ER 20 MEQ PO TBCR
40.0000 meq | EXTENDED_RELEASE_TABLET | ORAL | Status: AC
  Administered 2019-07-04 (×2): 40 meq via ORAL
  Filled 2019-07-04 (×2): qty 2

## 2019-07-04 NOTE — Progress Notes (Signed)
Tele said pt had st elevation "v lead high". Pt assessed. Pt not having chest pain. Md notified. New order received.

## 2019-07-04 NOTE — Evaluation (Signed)
Occupational Therapy Evaluation Patient Details Name: Jeremiah [name]. MRN: [id number] DOB: 1941-02-13 Today's Date: 07/04/2019    History of Present Illness  79 y.o. male with Past medical history of rheumatoid arthritis on immunosuppressive therapy, seizures, HTN, chronic lymphedema. Pt was here 2 months ago with LE edema/cellulitis, went to rehab and apparently had recoverd to near baseline (transfers, standing for self care with minimal family assist).  He now returns with weakness/decreased function and was found to be Covid +   Clinical Impression   Jeremiah [name] was seen for OT evaluation this date. Prior to hospital admission, pt required assistance from his family for BADL management including bathing and dressing tasks. Per family report, pt has been improving functional mobility and able to stand with minimal assist from his family. Currently pt demonstrates impairments as described below (See OT problem list) which functionally limit his ability to perform ADL/self-care tasks. Pt currently requires max assist for bed mobility as well as +2 mod/max assist for functional mobility. Max assist for LB ADL management.  Pt would benefit from skilled OT to address noted impairments and functional limitations (see below for any additional details) in order to maximize safety and independence while minimizing falls risk and caregiver burden. Upon hospital discharge, recommend STR to maximize pt safety and return to PLOF.       Follow Up Recommendations  SNF    Equipment Recommendations  Other (comment)(TBD at next venue of care.)    Recommendations for Other Services       Precautions / Restrictions Precautions Precautions: Fall Restrictions Weight Bearing Restrictions: No      Mobility Bed Mobility Overal bed mobility: Needs Assistance Bed Mobility: Rolling Rolling: Max assist;Mod assist         General bed mobility comments: Mod/Max A to reposition in bed this date. Pt  significantly pain limited. Per physical theapy required mod/max assist for sup<>sit t/f earlier this date. Would benefit from +2 for physical asssist/safety.  Transfers                      Balance Overall balance assessment: Needs assistance                                         ADL either performed or assessed with clinical judgement   ADL Overall ADL's : Needs assistance/impaired                                       General ADL Comments: Pt functionally limited by cardiopulmonary status, pain in BLE/BUE, generalized weakness, and decreased activity tolerance. Requires set-up assist for bed level grooming and self feeding. Max A for LB ADL management in seated position.     Vision Baseline Vision/History: Wears glasses Wears Glasses: At all times Additional Comments: Pt endorses seeing "little floaters" in his visual field for past 2-3 days. Will continue to assess within functional context.     Perception     Praxis      Pertinent Vitals/Pain Pain Assessment: Faces Faces Pain Scale: Hurts whole lot Pain Location: Endorses generalized pain, but endorses significant discomfort w/ BUE movement. States BLE also very painful during session. Pain Descriptors / Indicators: Sore;Grimacing;Guarding Pain Intervention(s): Limited activity within patient's tolerance;Monitored during session;Patient requesting pain meds-RN notified;Repositioned  Hand Dominance Right   Extremity/Trunk Assessment Upper Extremity Assessment Upper Extremity Assessment: Generalized weakness(Decreased AROM in BUE, minimal functional ROM. Unable to attain shoulder flexion above ~45 w/o pain.)   Lower Extremity Assessment Lower Extremity Assessment: Generalized weakness;Defer to PT evaluation       Communication Communication Communication: No difficulties   Cognition Arousal/Alertness: Awake/alert Behavior During Therapy: WFL for tasks  assessed/performed Overall Cognitive Status: History of cognitive impairments - at baseline                                 General Comments: Pt pleasant and eager to participate    General Comments  Pt O2 sats monitored in room. Pt monitor does not maintain consistent reading t/o session. When readings come through on room monitor pt is noted to be satting in mid 80's on RA with spO2 dropping to 76% during bed mobility attempts. Primary RN informed. This author leaves pt on 2L HFNC with spO2 at last reading of session at 98%.    Exercises Other Exercises Other Exercises: Pt educated in falls prevention strategies for home and hospital, energy conservation strategies including PLB during functional mobility for improved safety and activity tolerance. Pt would benefit from review/opportunity to implement strategies into ADL tasks.   Shoulder Instructions      Home Living Family/patient expects to be discharged to:: Skilled nursing facility Living Arrangements: Spouse/significant other;Children                           Home Equipment: Grab bars - tub/shower;Grab bars - toilet;Wheelchair - [employer] - 2 wheels          Prior Functioning/Environment Level of Independence: Needs assistance  Gait / Transfers Assistance Needed: Pt typically can transfer to/from w/c with little or no assist. Was doing some minimal walking a few months ago.  Could not walk and only barely stand while at [facility] 2 months ago but apparently had gotten back to near baseline until this acute weakness ADL's / Homemaking Assistance Needed: Pt able to do some bathing and dressing, but always has assist from family            OT Problem List: Decreased strength;Decreased coordination;Cardiopulmonary status limiting activity;Pain;Decreased range of motion;Decreased activity tolerance;Decreased safety awareness;Impaired balance (sitting and/or standing);Decreased knowledge of use of DME or  AE;Impaired UE functional use      OT Treatment/Interventions: Self-care/ADL training;Therapeutic exercise;Therapeutic activities;DME and/or AE instruction;Balance training;Patient/family education;Energy conservation    OT Goals(Current goals can be found in the care plan section) Acute Rehab OT Goals Patient Stated Goal: get strong enough to go back home OT Goal Formulation: With patient Time For Goal Achievement: 07/18/19 Potential to Achieve Goals: Good ADL Goals Pt Will Perform Lower Body Dressing: sit to/from stand;with min assist;with adaptive equipment(With LRAD PRN for improved safety/functional independence.) Pt Will Transfer to Toilet: bedside commode;stand pivot transfer;with min assist(With LRAD PRN for improved safety/functional independence.) Pt Will Perform Toileting - Clothing Manipulation and hygiene: sit to/from stand;with min assist;with adaptive equipment(With LRAD PRN for improved safety/functional independence.) Additional ADL Goal #1: Pt will independently verbalize a plan to implement at least 3 learned energy conservation strategies into his daily routines/home environment for improved safety and functional independence upon hospital DC.  OT Frequency: Min 2X/week   Barriers to D/C: Inaccessible home environment          Co-evaluation  AM-PAC OT "6 Clicks" Daily Activity     Outcome Measure Help from another person eating meals?: A Little Help from another person taking care of personal grooming?: A Little Help from another person toileting, which includes using toliet, bedpan, or urinal?: A Lot Help from another person bathing (including washing, rinsing, drying)?: A Lot Help from another person to put on and taking off regular upper body clothing?: A Little Help from another person to put on and taking off regular lower body clothing?: A Lot 6 Click Score: 15   End of Session Equipment Utilized During Treatment: Oxygen Nurse  Communication: Other (comment);Patient requests pain meds(SPO2 during session. Pt left on 2L 2/2 sats on RA.)  Activity Tolerance: Patient limited by fatigue;Patient limited by pain Patient left: in bed;with call bell/phone within reach;with bed alarm set  OT Visit Diagnosis: Other abnormalities of gait and mobility (R26.89);Muscle weakness (generalized) (M62.81);Pain Pain - Right/Left: (Both) Pain - part of body: Shoulder;Arm;Leg;Knee                Time: [id number] OT Time Calculation (min): 49 min Charges:  OT General Charges $OT Visit: 1 Visit OT Evaluation $OT Eval Moderate Complexity: 1 Mod OT Treatments $Self Care/Home Management : 23-37 mins  Shara Blazing, M.S., OTR/L Ascom: [phone] 07/04/19, 3:55 PM

## 2019-07-04 NOTE — Evaluation (Signed)
Physical Therapy Evaluation Patient Details Name: Jeremiah [name]. MRN: [id number] DOB: 04/17/1941 Today's Date: 07/04/2019   History of Present Illness   79 y.o. male with Past medical history of rheumatoid arthritis on immunosuppressive therapy, seizures, HTN, chronic lymphedema. Pt was here 2 months ago with LE edema/cellulitis, went to rehab and apparently had recoverd to near baseline (transfers, standing for self care with minimal family assist).  He now returns with weakness/decreased function and was found to be Covid +  Clinical Impression  Pt is very weak and functionally limited with PT exam.  He was pleasant and showed great effort whenever he could, however he had pain (mostly ankles and b/l shoulders/neck) with any palpation and during transition to/from sitting and generally speaking showed little to now functional AROM.  He struggled to maintain sitting at EOB and was not appropriate to even try getting to standing this date.  Pt reports that he had gotten back to standing and transferring to/from w/c after recent stent of rehab but at this time he is too weak.  Spoke with pt's son/caregiver and he confirms that pt had gotten back to some mobility at home but has been too weak for he and pt's wife to manage in the last few days.      Follow Up Recommendations SNF;Supervision/Assistance - 24 hour    Equipment Recommendations  None recommended by PT    Recommendations for Other Services       Precautions / Restrictions Precautions Precautions: Fall Restrictions Weight Bearing Restrictions: No      Mobility  Bed Mobility Overal bed mobility: Needs Assistance Bed Mobility: Supine to Sit;Sit to Supine     Supine to sit: Mod assist;Max assist Sit to supine: Max assist   General bed mobility comments: Pt pain limited with all transitions, heavy assist to get legs out/back onto bed as well as heavy assist to elevate trunk  Transfers                 General  transfer comment: Pt states "I can't even try" and per his pain and need for assist just to get to/maintain sitting PT agrees that it would have been extremely difficult to try getting to standing even with max assist  Ambulation/Gait                Stairs            Wheelchair Mobility    Modified Rankin (Stroke Patients Only)       Balance Overall balance assessment: Needs assistance Sitting-balance support: Single extremity supported;Feet supported Sitting balance-Leahy Scale: Poor Sitting balance - Comments: Pt leaning to the R and heavily reliant on R UE to maintain sitting                                     Pertinent Vitals/Pain Pain Assessment: 0-10 Pain Score: 7  Pain Location: reports generally sore all over, but mostly b/l feet (swollen, OA) and b/l shoulders/upper back    Home Living Family/patient expects to be discharged to:: Skilled nursing facility Living Arrangements: Spouse/significant other;Children             Home Equipment: Grab bars - tub/shower;Grab bars - toilet;Wheelchair - [employer] - 2 wheels(lift chair)      Prior Function Level of Independence: Needs assistance   Gait / Transfers Assistance Needed: Pt typically can transfer to/from w/c with little or  no assist. Was doing some minimal walking a few months ago.  Could not walk and only barely stand while at [facility] 2 months ago but apparently had gotten back to near baseline until this acute weakness  ADL's / Homemaking Assistance Needed: Pt able to do some bathing and dressing, but always has assist from family        Hand Dominance        Extremity/Trunk Assessment   Upper Extremity Assessment Upper Extremity Assessment: Generalized weakness(pain limited in shoulders, minimal functional AROM)    Lower Extremity Assessment Lower Extremity Assessment: Generalized weakness(pain limited )       Communication   Communication: No difficulties   Cognition Arousal/Alertness: Awake/alert Behavior During Therapy: WFL for tasks assessed/performed Overall Cognitive Status: History of cognitive impairments - at baseline                                 General Comments: Pt pleasant and eager to participate       General Comments General comments (skin integrity, edema, etc.): on room air t/o session with sats in the 90s t/o    Exercises     Assessment/Plan    PT Assessment Patient needs continued PT services  PT Problem List Decreased strength;Decreased range of motion;Decreased activity tolerance;Decreased balance;Decreased mobility;Decreased safety awareness;Pain       PT Treatment Interventions DME instruction;Gait training;Functional mobility training;Therapeutic exercise;Therapeutic activities;Balance training;Cognitive remediation;Patient/family education;Neuromuscular re-education;Wheelchair mobility training    PT Goals (Current goals can be found in the Care Plan section)  Acute Rehab PT Goals Patient Stated Goal: get strong enough to go back home PT Goal Formulation: With patient Time For Goal Achievement: 07/18/19 Potential to Achieve Goals: Fair    Frequency Min 2X/week   Barriers to discharge        Co-evaluation               AM-PAC PT "6 Clicks" Mobility  Outcome Measure Help needed turning from your back to your side while in a flat bed without using bedrails?: Total Help needed moving from lying on your back to sitting on the side of a flat bed without using bedrails?: Total Help needed moving to and from a bed to a chair (including a wheelchair)?: Total Help needed standing up from a chair using your arms (e.g., wheelchair or bedside chair)?: Total Help needed to walk in hospital room?: Total Help needed climbing 3-5 steps with a railing? : Total 6 Click Score: 6    End of Session   Activity Tolerance: Patient limited by pain;Patient limited by fatigue Patient left: with bed  alarm set;with call bell/phone within reach Nurse Communication: Mobility status PT Visit Diagnosis: Muscle weakness (generalized) (M62.81);Difficulty in walking, not elsewhere classified (R26.2);Pain Pain - Right/Left: Right Pain - part of body: Shoulder    Time: [id number] PT Time Calculation (min) (ACUTE ONLY): 29 min   Charges:   PT Evaluation $PT Eval Low Complexity: 1 Low PT Treatments $Therapeutic Activity: 8-22 mins        Kreg Shropshire, DPT 07/04/2019, 12:14 PM

## 2019-07-04 NOTE — Progress Notes (Signed)
PROGRESS NOTE    Jeremiah [name].  [id number] DOB: [birth year] DOA: 07/02/2019 PCP: Derinda Late, MD   Brief Narrative:  Jeremiah [name] a57 y.o.Caucasian malewith a known history of rheumatoid arthritis on methotrexate, type 2 diabetes mellitus, hypertension, PAD, GERD and seizure, who presented to the emergency room with acute onset ofgeneralized weakness and low level of energy with associated body aches that started over the weekend and got worse since Monday. He has been having fever and cold chills today. He admits to dry cough over the last 2 to 3 days without dyspnea or wheezing. His wife stated that he has been having altered mental status. No chest pain or palpitations. No nausea or vomiting or diarrhea. No dysuria, oliguria or hematuria or flank pain. He denied any loss of taste or smell.  Upon presenting to the emergency room, temperature was 102.1 with heart rate of 112 with otherwise normal vital signs.  PCR positive  Subjective: Pt. Was feeling little better when seen today. No new complaints.  Assessment & Plan:   Active Problems:   COVID-19 virus infection   Encephalopathy acute   Generalized weakness   Hypokalemia  COVID-19.Elevated inflammatory markers with mild worsening.  Oxygen requirement improved, now saturating well on room air.  Remained febrile at 100.3 over the past 24-hour.  Urine and blood cultures are with no growth. -Continue  on IV remdesivir-3 -Continue on vitamin D3, vitamin C, zinc sulfate, p.o. Pepcid - continue aspirin. -Monitor inflammatory markers.  2. Acute encephalopathy.  Likely metabolic from infection plus underlying dementia.  Per son , pt also has underlying dementia.He was alert and oriented today. Answering questions appropriately.  He was taking p.o.. -Discontinue IV fluids. - PT Evaluation.  3. Rheumatoid arthritis. -continue chloroquine. He was taken off Methotrexate per son. Last dose was sometime this  week.  4. Type II diabetes mellitus, well controlled. -The patient will be placed on supplemental coverage with NovoLog. -Last hemoglobin A1c was 5.7 and August 2020.  5. Seizure disorder. -Continue Lamictal.  6. Hypertension. -Continue Hyzaar and Lopressor.  7.Hypokalemia.  Potassium at 3.1. -Check magnesium-1.9 Replace K Monitor level  Objective: Vitals:   07/03/19 2200 07/04/19 0000 07/04/19 0517 07/04/19 0629  BP:  (!) 138/112 99/73   Pulse:  (!) 109 100   Resp: 13 18 20    Temp:  98.3 F (36.8 C) (!) 100.6 F (38.1 C) 99.1 F (37.3 C)  TempSrc:  Oral Oral Oral  SpO2: 99% 98% 99%   Weight:      Height:        Intake/Output Summary (Last 24 hours) at 07/04/2019 0741 Last data filed at 07/03/2019 1853 Gross per 24 hour  Intake 480 ml  Output 1225 ml  Net -745 ml   Filed Weights   07/02/19 1613  Weight: 104.3 kg    Examination:  General exam: Appears calm and comfortable  Respiratory system: Clear to auscultation. Respiratory effort normal. Cardiovascular system: S1 & S2 heard, RRR. No JVD, murmurs, rubs, gallops or clicks. Gastrointestinal system: Soft, nontender, nondistended, bowel sounds positive. Central nervous system: Alert and oriented. No focal neurological deficits.Symmetric 5 x 5 power. Extremities: 1+ edema, no cyanosis, pulses intact and symmetrical. Skin: No rashes, lesions or ulcers Psychiatry: Judgement and insight appear normal.    DVT prophylaxis: Lovenox Code Status: Full Family Communication: No family at bedside. Disposition Plan: Pending improvement, and completion of remdesivir.  Consultants:   None  Procedures:  Antimicrobials:   Data Reviewed: I have personally reviewed  following labs and imaging studies  CBC: Recent Labs  Lab 07/02/19 1627 07/03/19 0742 07/04/19 0548  WBC 8.7 6.8 7.1  NEUTROABS 7.3 5.2 5.0  HGB 11.6* 11.0* 10.4*  HCT 37.0* 35.0* 32.9*  MCV 86.7 86.8 85.2  PLT 261 219 [id number]   Basic  Metabolic Panel: Recent Labs  Lab 07/02/19 1627 07/03/19 0742 07/04/19 0548  NA 138 139 136  K 3.4* 3.1* 3.1*  CL 101 105 103  CO2 27 27 25   GLUCOSE 114* 112* 103*  BUN 20 19 17   CREATININE 1.16 0.88 0.79  CALCIUM 9.0 8.7* 8.5*   GFR: Estimated Creatinine Clearance: 98.1 mL/min (by C-G formula based on SCr of 0.79 mg/dL). Liver Function Tests: Recent Labs  Lab 07/02/19 1627 07/03/19 0742 07/04/19 0548  AST 16 13* 11*  ALT 9 10 8   ALKPHOS 107 102 86  BILITOT 1.0 0.6 1.0  PROT 7.6 7.0 6.4*  ALBUMIN 3.2* 2.9* 2.6*   Recent Labs  Lab 07/02/19 1627  LIPASE 25   No results for input(s): AMMONIA in the last 168 hours. Coagulation Profile: Recent Labs  Lab 07/02/19 1627  INR 1.1   Cardiac Enzymes: No results for input(s): CKTOTAL, CKMB, CKMBINDEX, TROPONINI in the last 168 hours. BNP (last 3 results) No results for input(s): PROBNP in the last 8760 hours. HbA1C: No results for input(s): HGBA1C in the last 72 hours. CBG: Recent Labs  Lab 07/03/19 0734 07/03/19 1123 07/03/19 1624 07/03/19 2044 07/03/19 2132  GLUCAP 89 78 85 61* 104*   Lipid Profile: No results for input(s): CHOL, HDL, LDLCALC, TRIG, CHOLHDL, LDLDIRECT in the last 72 hours. Thyroid Function Tests: No results for input(s): TSH, T4TOTAL, FREET4, T3FREE, THYROIDAB in the last 72 hours. Anemia Panel: Recent Labs    07/03/19 0742 07/04/19 0548  FERRITIN 136 122   Sepsis Labs: Recent Labs  Lab 07/02/19 1627  PROCALCITON 0.14  LATICACIDVEN 1.1    Recent Results (from the past 240 hour(s))  Blood Culture (routine x 2)     Status: None (Preliminary result)   Collection Time: 07/02/19  4:27 PM   Specimen: BLOOD  Result Value Ref Range Status   Specimen Description BLOOD Blood Culture adequate volume  Final   Special Requests   Final    BOTTLES DRAWN AEROBIC AND ANAEROBIC LEFT ANTECUBITAL   Culture   Final    NO GROWTH 2 DAYS Performed at [facility], [address],  [place], [place] [address]    Report Status PENDING  Incomplete  Urine culture     Status: None   Collection Time: 07/02/19  5:16 PM   Specimen: Urine, Random  Result Value Ref Range Status   Specimen Description   Final    URINE, RANDOM Performed at [facility], [address], [place], [place] [address]    Special Requests   Final    NONE Performed at [facility], [address], [place], [place] [address]    Culture   Final    NO GROWTH Performed at [facility], [place] [address], [place], [place] [address]    Report Status 07/04/2019 FINAL  Final  Respiratory Panel by RT PCR (Flu A&B, Covid) - Nasopharyngeal Swab     Status: Abnormal   Collection Time: 07/02/19  6:35 PM   Specimen: Nasopharyngeal Swab  Result Value Ref Range Status   SARS Coronavirus 2 by RT PCR POSITIVE (A) NEGATIVE Final    Comment: RESULT CALLED TO, READ BACK  BY AND VERIFIED WITH: ANDREA BRYANT 07/02/19 1940 KLW (NOTE) SARS-CoV-2 target nucleic acids are DETECTED. SARS-CoV-2 RNA is generally detectable in upper respiratory specimens  during the acute phase of infection. Positive results are indicative of the presence of the identified virus, but do not rule out bacterial infection or co-infection with other pathogens not detected by the test. Clinical correlation with patient history and other diagnostic information is necessary to determine patient infection status. The expected result is Negative. Fact Sheet for Patients:  PinkCheek.be Fact Sheet for Healthcare Providers: GravelBags.it This test is not yet approved or cleared by the [place] FDA and  has been authorized for detection and/or diagnosis of SARS-CoV-2 by FDA under an Emergency Use Authorization (EUA).  This EUA will remain in effect (meaning this test can be used) for th e duration of  the COVID-19 declaration under Section 564(b)(1) of the Act,  21 U.S.C. section 360bbb-3(b)(1), unless the authorization is terminated or revoked sooner.    Influenza A by PCR NEGATIVE NEGATIVE Final   Influenza B by PCR NEGATIVE NEGATIVE Final    Comment: (NOTE) The Xpert Xpress SARS-CoV-2/FLU/RSV assay is intended as an aid in  the diagnosis of influenza from Nasopharyngeal swab specimens and  should not be used as a sole basis for treatment. Nasal washings and  aspirates are unacceptable for Xpert Xpress SARS-CoV-2/FLU/RSV  testing. Fact Sheet for Patients: PinkCheek.be Fact Sheet for Healthcare Providers: GravelBags.it This test is not yet approved or cleared by the [place] FDA and  has been authorized for detection and/or diagnosis of SARS-CoV-2 by  FDA under an Emergency Use Authorization (EUA). This EUA will remain  in effect (meaning this test can be used) for the duration of the  Covid-19 declaration under Section 564(b)(1) of the Act, 21  U.S.C. section 360bbb-3(b)(1), unless the authorization is  terminated or revoked. Performed at [facility], [address] [place], [place] [address]      Radiology Studies: DG Chest [place] 1 View  Result Date: 07/02/2019 CLINICAL DATA:  Generalized pain, rheumatoid arthritis, fever EXAM: PORTABLE CHEST 1 VIEW COMPARISON:  04/20/2019 FINDINGS: Single frontal view of the chest demonstrates an unremarkable cardiac silhouette. No airspace disease, effusion, or pneumothorax. No acute bony abnormalities. Postsurgical changes right shoulder. Significant bilateral shoulder arthritis. IMPRESSION: 1. No acute intrathoracic process. Electronically Signed   By: Randa Ngo M.D.   On: 07/02/2019 17:00    Scheduled Meds: . vitamin C  500 mg Oral Daily  . aspirin  81 mg Oral Daily  . cholecalciferol  1,000 Units Oral Daily  . enoxaparin (LOVENOX) injection  40 mg Subcutaneous Q24H  . famotidine  20 mg Oral BID  . feeding supplement  (PRO-STAT SUGAR FREE 64)  30 mL Per Tube BID BM  . folic acid  1 mg Oral Daily  . furosemide  40 mg Oral Daily  . losartan  50 mg Oral Daily   And  . hydrochlorothiazide  12.5 mg Oral Daily  . hydroxychloroquine  200 mg Oral Daily  . insulin aspart  0-5 Units Subcutaneous QHS  . insulin aspart  0-9 Units Subcutaneous TID PC & HS  . lamoTRIgine  200 mg Oral BID  . lamoTRIgine  25 mg Oral BID  . magnesium oxide  400 mg Oral Daily  . metoprolol tartrate  12.5 mg Per Tube BID  . pantoprazole  40 mg Oral Daily  . polyethylene glycol  17 g Oral Daily  . thiamine  100 mg Oral  Daily  . traZODone  50 mg Oral QHS  . zinc sulfate  220 mg Oral Daily   Continuous Infusions: . sodium chloride 100 mL/hr at 07/04/19 0059  . remdesivir 100 mg in NS 100 mL Stopped (07/03/19 1230)     LOS: 2 days   Time spent: 40 minutes.  Lorella Nimrod, MD [facility]  If 7PM-7AM, please contact night-coverage [email or web address]  07/04/2019, 7:41 AM   This record has been created using Systems analyst. Errors have been sought and corrected,but may not always be located. Such creation errors do not reflect on the standard of care.

## 2019-07-04 NOTE — Plan of Care (Signed)

## 2019-07-05 ENCOUNTER — Inpatient Hospital Stay: Payer: Medicare PPO

## 2019-07-05 ENCOUNTER — Encounter: Payer: Self-pay | Admitting: Family Medicine

## 2019-07-05 DIAGNOSIS — M79669 Pain in unspecified lower leg: Secondary | ICD-10-CM

## 2019-07-05 LAB — CBC WITH DIFFERENTIAL/PLATELET
Abs Immature Granulocytes: 0.03 10*3/uL (ref 0.00–0.07)
Basophils Absolute: 0 10*3/uL (ref 0.0–0.1)
Basophils Relative: 1 %
Eosinophils Absolute: 0.1 10*3/uL (ref 0.0–0.5)
Eosinophils Relative: 1 %
HCT: 34.8 % — ABNORMAL LOW (ref 39.0–52.0)
Hemoglobin: 11.3 g/dL — ABNORMAL LOW (ref 13.0–17.0)
Immature Granulocytes: 0 %
Lymphocytes Relative: 18 %
Lymphs Abs: 1.2 10*3/uL (ref 0.7–4.0)
MCH: 27.3 pg (ref 26.0–34.0)
MCHC: 32.5 g/dL (ref 30.0–36.0)
MCV: 84.1 fL (ref 80.0–100.0)
Monocytes Absolute: 1 10*3/uL (ref 0.1–1.0)
Monocytes Relative: 15 %
Neutro Abs: 4.5 10*3/uL (ref 1.7–7.7)
Neutrophils Relative %: 65 %
Platelets: 228 10*3/uL (ref 150–400)
RBC: 4.14 MIL/uL — ABNORMAL LOW (ref 4.22–5.81)
RDW: 15.1 % (ref 11.5–15.5)
WBC: 6.9 10*3/uL (ref 4.0–10.5)
nRBC: 0 % (ref 0.0–0.2)

## 2019-07-05 LAB — GLUCOSE, CAPILLARY
Glucose-Capillary: 105 mg/dL — ABNORMAL HIGH (ref 70–99)
Glucose-Capillary: 113 mg/dL — ABNORMAL HIGH (ref 70–99)
Glucose-Capillary: 78 mg/dL (ref 70–99)
Glucose-Capillary: 66 mg/dL — ABNORMAL LOW (ref 70–99)
Glucose-Capillary: 75 mg/dL (ref 70–99)
Glucose-Capillary: 110 mg/dL — ABNORMAL HIGH (ref 70–99)

## 2019-07-05 LAB — LACTIC ACID, PLASMA
Lactic Acid, Venous: 2.1 mmol/L (ref 0.5–1.9)
Lactic Acid, Venous: 1.3 mmol/L (ref 0.5–1.9)

## 2019-07-05 LAB — COMPREHENSIVE METABOLIC PANEL
ALT: 9 U/L (ref 0–44)
AST: 11 U/L — ABNORMAL LOW (ref 15–41)
Albumin: 2.6 g/dL — ABNORMAL LOW (ref 3.5–5.0)
Alkaline Phosphatase: 92 U/L (ref 38–126)
Anion gap: 8 (ref 5–15)
BUN: 18 mg/dL (ref 8–23)
CO2: 26 mmol/L (ref 22–32)
Calcium: 8.7 mg/dL — ABNORMAL LOW (ref 8.9–10.3)
Chloride: 103 mmol/L (ref 98–111)
Creatinine, Ser: 0.86 mg/dL (ref 0.61–1.24)
GFR calc Af Amer: >60 mL/min (ref >60)
GFR calc non Af Amer: >60 mL/min (ref >60)
Glucose, Bld: 99 mg/dL (ref 70–99)
Potassium: 3.3 mmol/L — ABNORMAL LOW (ref 3.5–5.1)
Sodium: 137 mmol/L (ref 135–145)
Total Bilirubin: 0.8 mg/dL (ref 0.3–1.2)
Total Protein: 6.7 g/dL (ref 6.5–8.1)

## 2019-07-05 LAB — BLOOD GAS, ARTERIAL
Acid-Base Excess: 4.4 mmol/L — ABNORMAL HIGH (ref 0.0–2.0)
Bicarbonate: 26.7 mmol/L (ref 20.0–28.0)
FIO2: 0.21
O2 Saturation: 96.7 %
Patient temperature: 37
pCO2 arterial: 32 mmHg (ref 32.0–48.0)
pH, Arterial: 7.53 — ABNORMAL HIGH (ref 7.350–7.450)
pO2, Arterial: 77 mmHg — ABNORMAL LOW (ref 83.0–108.0)

## 2019-07-05 LAB — TROPONIN I (HIGH SENSITIVITY)
Troponin I (High Sensitivity): 29 ng/L — ABNORMAL HIGH (ref <18)
Troponin I (High Sensitivity): 26 ng/L — ABNORMAL HIGH (ref <18)

## 2019-07-05 LAB — PROCALCITONIN: Procalcitonin: 0.36 ng/mL

## 2019-07-05 LAB — MAGNESIUM: Magnesium: 2 mg/dL (ref 1.7–2.4)

## 2019-07-05 LAB — FIBRIN DERIVATIVES D-DIMER (ARMC ONLY): Fibrin derivatives D-dimer (ARMC): 2154.25 ng/mL (FEU) — ABNORMAL HIGH (ref 0.00–499.00)

## 2019-07-05 LAB — MRSA PCR SCREENING: MRSA by PCR: NEGATIVE

## 2019-07-05 LAB — C-REACTIVE PROTEIN: CRP: 22.2 mg/dL — ABNORMAL HIGH (ref <1.0)

## 2019-07-05 LAB — FERRITIN: Ferritin: 152 ng/mL (ref 24–336)

## 2019-07-05 MED ORDER — POTASSIUM CHLORIDE CRYS ER 20 MEQ PO TBCR
40.0000 meq | EXTENDED_RELEASE_TABLET | Freq: Once | ORAL | Status: AC
  Administered 2019-07-05: 40 meq via ORAL
  Filled 2019-07-05: qty 2

## 2019-07-05 MED ORDER — INSULIN ASPART 100 UNIT/ML ~~LOC~~ SOLN
0.0000 [IU] | Freq: Three times a day (TID) | SUBCUTANEOUS | Status: DC
  Administered 2019-07-07: 17:00:00 1 [IU] via SUBCUTANEOUS
  Administered 2019-07-08: 22:00:00 2 [IU] via SUBCUTANEOUS
  Administered 2019-07-08 – 2019-07-09 (×4): 1 [IU] via SUBCUTANEOUS
  Administered 2019-07-09: 2 [IU] via SUBCUTANEOUS
  Filled 2019-07-05 (×8): qty 1

## 2019-07-05 MED ORDER — SODIUM CHLORIDE 0.9 % IV SOLN: INTRAVENOUS | Status: DC

## 2019-07-05 MED ORDER — ACETAMINOPHEN 650 MG RE SUPP
650.0000 mg | RECTAL | Status: DC | PRN
  Administered 2019-07-05: 16:00:00 650 mg via RECTAL

## 2019-07-05 MED ORDER — ACETAMINOPHEN 650 MG RE SUPP
650.0000 mg | RECTAL | Status: DC | PRN
  Filled 2019-07-05: qty 1

## 2019-07-05 MED ORDER — INSULIN ASPART 100 UNIT/ML ~~LOC~~ SOLN: 0.0000 [IU] | SUBCUTANEOUS | Status: DC

## 2019-07-05 MED ORDER — IOHEXOL 350 MG/ML SOLN
75.0000 mL | Freq: Once | INTRAVENOUS | Status: AC | PRN
  Administered 2019-07-05: 15:00:00 75 mL via INTRAVENOUS

## 2019-07-05 MED ORDER — ORAL CARE MOUTH RINSE
15.0000 mL | Freq: Two times a day (BID) | OROMUCOSAL | Status: DC
  Administered 2019-07-06 – 2019-07-09 (×8): 15 mL via OROMUCOSAL

## 2019-07-05 MED ORDER — SODIUM CHLORIDE 0.9 % IV SOLN
1.0000 g | Freq: Three times a day (TID) | INTRAVENOUS | Status: DC
  Administered 2019-07-05 – 2019-07-08 (×9): 1 g via INTRAVENOUS
  Filled 2019-07-05 (×14): qty 1

## 2019-07-05 NOTE — Progress Notes (Signed)
Response to RR, stood outside gathering place for staff to suit up for silent prayer.

## 2019-07-05 NOTE — Progress Notes (Signed)
Soap suds enema held. Pt produced moderate soft brown stool, bowl sounds active. NP, B morrison made aware. NP scheduled Milk of Mg for daily dosing and advised to hold off on PRN dose d/t scheduling of daily dosage.

## 2019-07-05 NOTE — Progress Notes (Addendum)
CBG of 66. Pt is Alert oriented x 3, skin dry, Mentation improved from earlier today per previous shift RN report. Juice provided. Per NT, Pt agreeable to drink approximately half of juice provided. Recheck CBG resulted 75.  NP, Rachael Fee aware. Will continue to monitor.

## 2019-07-05 NOTE — Progress Notes (Signed)
Pharmacy Antibiotic Note  Jeremiah [name]. is a 79 y.o. male admitted on 07/02/2019 with sepsis.  Pharmacy has been consulted for meropenem dosing.  Plan: Meropenem 1 gm IV Q8H ordered to start on 2/14 @ 2030.  Height: 6\' 1"  (185.4 cm) Weight: 230 lb (104.3 kg) IBW/kg (Calculated) : 79.9  Temp (24hrs), Avg:99.4 F (37.4 C), Min:97.8 F (36.6 C), Max:102.5 F (39.2 C)  Recent Labs  Lab 07/02/19 1627 07/03/19 0742 07/04/19 0548 07/05/19 0552  WBC 8.7 6.8 7.1 6.9  CREATININE 1.16 0.88 0.79 0.86  LATICACIDVEN 1.1  --   --   --     Estimated Creatinine Clearance: 91.3 mL/min (by C-G formula based on SCr of 0.86 mg/dL).    Allergies  Allergen Reactions  . Cefoperazone Anaphylaxis  . Penicillins Anaphylaxis and Other (See Comments)    Has patient had a PCN reaction causing immediate rash, facial/tongue/throat swelling, SOB or lightheadedness with hypotension: Yes Has patient had a PCN reaction causing severe rash involving mucus membranes or skin necrosis: No Has patient had a PCN reaction that required hospitalization No Has patient had a PCN reaction occurring within the last 10 years: No If all of the above answers are "NO", then may proceed with Cephalosporin use.  . Latex Rash    Antimicrobials this admission:   >>    >>   Dose adjustments this admission:   Microbiology results:  BCx:   UCx:    Sputum:    MRSA PCR:   Thank you for allowing pharmacy to be a part of this patient's care.  Khrystian Schauf D 07/05/2019 8:20 PM

## 2019-07-05 NOTE — Progress Notes (Signed)
Pt showing ST elevation "V high". Md notified. New order received.

## 2019-07-05 NOTE — Progress Notes (Signed)
CRITICAL VALUE ALERT  Critical Value:  Latic acid 2.1  Date & Time Notied:  2030 07/05/2019 Provider Notified: Rachael Fee,  NP  Orders Received/Actions taken:  Per NP continue to trend Latic.

## 2019-07-05 NOTE — TOC Initial Note (Signed)
Transition of Care (TOC) - Initial/Assessment Note    Patient Details  Name: Jeremiah [name]. MRN: [id number] Date of Birth: 1940-10-08  Transition of Care [facility]) CM/SW Contact:    Shelbie Hutching, RN Phone Number: 07/05/2019, 1:55 PM  Clinical Narrative:                 Patient admitted with COVID 19, history of rheumatoid arthritis.  Patient is from home with his wife.  PT has worked with patient and recommends skilled nursing rehab.  RNCM unable to get patient on the phone, he is currently in the [place] isolation unit.  Patient's wife agrees with SNF as long as it is not at [facility], she reports he was there last time and he didn't get the rehab he needed.  RNCM explained that there are no facilities in [facility] taking acute COVID patient's so SNF search will be in [facility].  Bed search started. RNCM will follow up with patient and patient's wife tomorrow.  Expected Discharge Plan: Skilled Nursing Facility Barriers to Discharge: Continued Medical Work up   Patient Goals and CMS Choice   CMS Medicare.gov Compare Post Acute Care list provided to:: Patient Represenative (must comment)(wife) Choice offered to / list presented to : Patient, Spouse  Expected Discharge Plan and Services Expected Discharge Plan: [place]   Discharge Planning Services: CM Consult Post Acute Care Choice: [place] Living arrangements for the past 2 months: Single Family Home                                      Prior Living Arrangements/Services Living arrangements for the past 2 months: Single Family Home Lives with:: Spouse Patient language and need for interpreter reviewed:: Yes Do you feel safe going back to the place where you live?: Yes      Need for Family Participation in Patient Care: Yes (Comment)(COVID) Care giver support system in place?: Yes (comment)(wife and son)   Criminal Activity/Legal Involvement Pertinent to Current  Situation/Hospitalization: No - Comment as needed  Activities of Daily Living Home Assistive Devices/Equipment: Wheelchair, Environmental consultant (specify type), Dentures (specify type) ADL Screening (condition at time of admission) Patient's cognitive ability adequate to safely complete daily activities?: Yes Is the patient deaf or have difficulty hearing?: No Does the patient have difficulty seeing, even when wearing glasses/contacts?: No Does the patient have difficulty concentrating, remembering, or making decisions?: No Patient able to express need for assistance with ADLs?: Yes Does the patient have difficulty dressing or bathing?: Yes Independently performs ADLs?: Yes (appropriate for developmental age) Does the patient have difficulty walking or climbing stairs?: Yes Weakness of Legs: Both Weakness of Arms/Hands: None  Permission Sought/Granted Permission sought to share information with : Family Supports, Customer service manager, Case Optician, dispensing granted to share information with : Yes, Verbal Permission Granted     Permission granted to share info w AGENCY: SNF of choice  Permission granted to share info w Relationship: wife     Emotional Assessment       Orientation: : Oriented to Self, Oriented to Place, Oriented to  Time, Oriented to Situation Alcohol / Substance Use: Not Applicable Psych Involvement: No (comment)  Admission diagnosis:  Encephalopathy acute [G93.40] Generalized weakness [R53.1] Rheumatoid arthritis, involving unspecified site, unspecified whether rheumatoid factor present ([place]) [M06.9] COVID-19 virus infection [U07.1] COVID-19 [U07.1] Patient Active Problem List   Diagnosis  Date Noted  . Calf tenderness   . Encephalopathy acute   . Generalized weakness   . Hypokalemia   . COVID-19 virus infection 07/02/2019  . Dysphagia   . Oral mucositis 04/14/2019  . Neutropenia with fever ([place]) 04/11/2019  . Acute metabolic encephalopathy [id number]  .  Pancytopenia ([place])   . Cellulitis of lower extremity 04/08/2019  . Folic acid deficiency   . AKI (acute kidney injury) ([place])   . Pressure injury of skin 04/05/2019  . Other pancytopenia ([place])   . Cellulitis 03/31/2019  . Edema of both legs 08/19/2018  . History of allergy to latex 07/22/2018  . Lymphedema 06/16/2018  . Chronic venous insufficiency 06/16/2018  . Venous ulcer of leg ([place]) 06/16/2018  . PAD (peripheral artery disease) ([place]) 06/16/2018  . Diabetes mellitus type 2, uncomplicated ([place]) 0000000  . 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 Obesity (BMI 30.0-34.9) 02/27/2018  . Primary osteoarthritis of left knee 02/27/2018  . Urinary retention 02/25/2018  . Rheumatoid arthritis involving multiple sites ([place]) 02/25/2018  . Left knee pain 02/20/2018  . Neurogenic pain 10/02/2017  . Spondylosis without myelopathy or radiculopathy, lumbosacral region 07/17/2017  . Trigger point with back pain (recurrent/intermittent) 07/17/2017  . Chronic musculoskeletal pain 02/27/2017  . Myofascial pain syndrome 02/27/2017  . Long term current use of anticoagulant (Plaquenil) 02/27/2017  . Chronic lumbar radiculitis (L5 dermatome) (Right) 02/27/2017  . Chronic lumbar radiculitis (L3 dermatome) (Left) 02/27/2017  . Chronic foot pain (Right) 02/27/2017  . Chronic thoracic back pain (Bilateral) (R>L) 02/27/2017  . Chronic neck pain (posterior) (Bilateral) (R>L) 02/27/2017  . Chronic shoulder pain (Bilateral) (L>R) 02/27/2017  . 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 DDD (degenerative disc disease), lumbar 02/19/2017  . 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 Chronic knee pain (Bilateral) (R>L) 02/19/2017  . 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 Grade 1 Retrolisthesis of L1 over L2 02/19/2017  . T11 wedge compression fracture ([place]. 1993) 02/19/2017  . Lumbar facet arthropathy 02/19/2017  . Lumbar facet syndrome (Bilateral)  02/19/2017  . Lumbar foraminal stenosis (Bilateral: L1-2)  (Right: L3-4, L4-5) 02/19/2017  . 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 Disorder of skeletal system 02/19/2017  . Pharmacologic therapy 02/19/2017  . Problems influencing health status 02/19/2017  . Chronic pain syndrome 01/14/2017  . Gastroesophageal reflux disease 01/14/2017  . Hyperlipidemia 01/14/2017  . Lumbar radiculopathy 01/14/2017  . Postoperative anemia due to acute blood loss 01/14/2017  . Postprocedural hypotension 01/14/2017  . Chronic lower extremity pain (Secondary Area of Pain) (Bilateral) (R>L) 01/14/2017  . Rheumatoid arthritis ([place]) 12/17/2013  . Diabetes ([place]) 12/17/2013  . Benign essential hypertension 11/13/2013  . Chronic low back pain (Primary Area of Pain) (Bilateral) (R>L) 11/13/2013  . Prostatitis 11/13/2013  . Seizure ([place]) 11/13/2013  . Left bundle branch block 05/21/2013  . Dorsalgia 05/21/2013   PCP:  Derinda Late, MD Pharmacy:   [id number] - [place], [address] [place] [address] Phone: [phone] Fax: [phone]     Social Determinants of Health (SDOH) Interventions    Readmission Risk Interventions No flowsheet data found.

## 2019-07-05 NOTE — Progress Notes (Signed)
PROGRESS NOTE    Jeremiah [name].  [id number] DOB: [birth year] DOA: 07/02/2019 PCP: Derinda Late, MD   Brief Narrative:  Jeremiah [name] a5 y.o.Caucasian malewith a known history of rheumatoid arthritis on methotrexate, type 2 diabetes mellitus, hypertension, PAD, GERD and seizure, who presented to the emergency room with acute onset ofgeneralized weakness and low level of energy with associated body aches that started over the weekend and got worse since Monday. He has been having fever and cold chills today. He admits to dry cough over the last 2 to 3 days without dyspnea or wheezing. His wife stated that he has been having altered mental status. No chest pain or palpitations. No nausea or vomiting or diarrhea. No dysuria, oliguria or hematuria or flank pain. He denied any loss of taste or smell.  Upon presenting to the emergency room, temperature was 102.1 with heart rate of 112 with otherwise normal vital signs.  PCR positive  Subjective: Pt. Was feeling more lethargic and drowsy,stating he is tired and wants to sleep. No chest pain or SOB.  Assessment & Plan:   Active Problems:   COVID-19 virus infection   Encephalopathy acute   Generalized weakness   Hypokalemia  COVID-19.Elevated inflammatory markers with worsening.  Oxygen requirement improved initially, again on 2L although saturating 100%. Urine and blood cultures are with no growth.  Patient remained afebrile over the last 24-hour. -Continue  on IV remdesivir-4 -Continue on vitamin D3, vitamin C, zinc sulfate, p.o. Pepcid - continue aspirin. -Monitor inflammatory markers.  2. Acute encephalopathy.  Likely metabolic from infection plus underlying dementia.  Per son , pt also has underlying dementia. He was more lethargic today although able to answer questions appropriately.  He was refusing PO intake today. Worsening mental status today. -Check LE venous doppler for DVT -CTA to r/o PE. -PCT to  r/o superadded bacterial infection. -If becomes more lethargic then need to check ABG. - PT Evaluation-Recommending SNF placement. -Social worker consult to find a bed.  Telemetry concerns.  Paged by nursing staff yesterday evening that they received a call from telemetry that patient is having ST elevation.  Patient remained completely asymptomatic, no chest pain or shortness of breath. EKG shows sinus rhythm, left axis with left bundle branch block, J point elevation in septal leads,similar to the KG on admission. -Check troponin- barely positive at 29, most likely demand. -Most likely a false alarm.  3. Rheumatoid arthritis. -continue chloroquine. He was taken off Methotrexate per son. Last dose was sometime this week.  4. Type II diabetes mellitus, well controlled.  CBG within goal. -Continue supplemental coverage with NovoLog as needed. -Last hemoglobin A1c was 5.7 and August 2020.  5. Seizure disorder. -Continue Lamictal.  6. Hypertension. -Continue Hyzaar and Lopressor.  7.Hypokalemia.  Potassium at 3.3. -Check magnesium-2.2 Replace K Monitor level  Objective: Vitals:   07/04/19 1600 07/04/19 2129 07/05/19 0300 07/05/19 0400  BP: (!) 155/73 139/75  138/69  Pulse: 86 (!) 108 98 99  Resp: 19  17 17   Temp: 98.3 F (36.8 C)     TempSrc: Oral     SpO2: 99%  97% 97%  Weight:      Height:        Intake/Output Summary (Last 24 hours) at 07/05/2019 0728 Last data filed at 07/04/2019 2100 Gross per 24 hour  Intake 720 ml  Output 1550 ml  Net -830 ml   Filed Weights   07/02/19 1613  Weight: 104.3 kg    Examination:  General  exam: Appears calm and comfortable. Little more lethargic. Respiratory system: Clear to auscultation. Respiratory effort normal. Cardiovascular system: S1 & S2 heard, RRR. No JVD, murmurs, rubs, gallops or clicks. Gastrointestinal system: Soft, nontender, nondistended, bowel sounds positive. Central nervous system: Alert and oriented  to self today, Able to follow commands. No focal neurological deficits. Extremities: 1+ edema, no cyanosis, pulses intact and symmetrical.Left calf tenderness. Psychiatry: Judgement and insight appear impaired today.    DVT prophylaxis: Lovenox Code Status: Full Family Communication: Son was updated on phone. Disposition Plan: Pending improvement, and completion of remdesivir.  Consultants:   None  Procedures:  Antimicrobials:   Data Reviewed: I have personally reviewed following labs and imaging studies  CBC: Recent Labs  Lab 07/02/19 1627 07/03/19 0742 07/04/19 0548 07/05/19 0552  WBC 8.7 6.8 7.1 6.9  NEUTROABS 7.3 5.2 5.0 4.5  HGB 11.6* 11.0* 10.4* 11.3*  HCT 37.0* 35.0* 32.9* 34.8*  MCV 86.7 86.8 85.2 84.1  PLT 261 219 220 [id number]   Basic Metabolic Panel: Recent Labs  Lab 07/02/19 1627 07/03/19 0742 07/04/19 0548 07/05/19 0552  NA 138 139 136 137  K 3.4* 3.1* 3.1* 3.3*  CL 101 105 103 103  CO2 27 27 25 26   GLUCOSE 114* 112* 103* 99  BUN 20 19 17 18   CREATININE 1.16 0.88 0.79 0.86  CALCIUM 9.0 8.7* 8.5* 8.7*  MG  --   --  1.9  --    GFR: Estimated Creatinine Clearance: 91.3 mL/min (by C-G formula based on SCr of 0.86 mg/dL). Liver Function Tests: Recent Labs  Lab 07/02/19 1627 07/03/19 0742 07/04/19 0548 07/05/19 0552  AST 16 13* 11* 11*  ALT 9 10 8 9   ALKPHOS 107 102 86 92  BILITOT 1.0 0.6 1.0 0.8  PROT 7.6 7.0 6.4* 6.7  ALBUMIN 3.2* 2.9* 2.6* 2.6*   Recent Labs  Lab 07/02/19 1627  LIPASE 25   No results for input(s): AMMONIA in the last 168 hours. Coagulation Profile: Recent Labs  Lab 07/02/19 1627  INR 1.1   Cardiac Enzymes: No results for input(s): CKTOTAL, CKMB, CKMBINDEX, TROPONINI in the last 168 hours. BNP (last 3 results) No results for input(s): PROBNP in the last 8760 hours. HbA1C: No results for input(s): HGBA1C in the last 72 hours. CBG: Recent Labs  Lab 07/04/19 0757 07/04/19 1142 07/04/19 1625 07/04/19 1718  07/04/19 2122  GLUCAP 93 83 78 80 72   Lipid Profile: No results for input(s): CHOL, HDL, LDLCALC, TRIG, CHOLHDL, LDLDIRECT in the last 72 hours. Thyroid Function Tests: No results for input(s): TSH, T4TOTAL, FREET4, T3FREE, THYROIDAB in the last 72 hours. Anemia Panel: Recent Labs    07/04/19 0548 07/05/19 0552  FERRITIN 122 152   Sepsis Labs: Recent Labs  Lab 07/02/19 1627  PROCALCITON 0.14  LATICACIDVEN 1.1    Recent Results (from the past 240 hour(s))  Blood Culture (routine x 2)     Status: None (Preliminary result)   Collection Time: 07/02/19  4:27 PM   Specimen: BLOOD  Result Value Ref Range Status   Specimen Description BLOOD Blood Culture adequate volume  Final   Special Requests   Final    BOTTLES DRAWN AEROBIC AND ANAEROBIC LEFT ANTECUBITAL   Culture   Final    NO GROWTH 3 DAYS Performed at [facility], [address], [place], [place] [address]    Report Status PENDING  Incomplete  Urine culture     Status: None   Collection Time: 07/02/19  5:16  PM   Specimen: Urine, Random  Result Value Ref Range Status   Specimen Description   Final    URINE, RANDOM Performed at [facility], [address] [place], [place] [address]    Special Requests   Final    NONE Performed at [facility], [address], [place], [place] [address]    Culture   Final    NO GROWTH Performed at [facility], [place] [address], [place], [place] [address]    Report Status 07/04/2019 FINAL  Final  Respiratory Panel by RT PCR (Flu A&B, Covid) - Nasopharyngeal Swab     Status: Abnormal   Collection Time: 07/02/19  6:35 PM   Specimen: Nasopharyngeal Swab  Result Value Ref Range Status   SARS Coronavirus 2 by RT PCR POSITIVE (A) NEGATIVE Final    Comment: RESULT CALLED TO, READ BACK BY AND VERIFIED WITH: ANDREA BRYANT 07/02/19 1940 KLW (NOTE) SARS-CoV-2 target nucleic acids are DETECTED. SARS-CoV-2 RNA is generally detectable in upper  respiratory specimens  during the acute phase of infection. Positive results are indicative of the presence of the identified virus, but do not rule out bacterial infection or co-infection with other pathogens not detected by the test. Clinical correlation with patient history and other diagnostic information is necessary to determine patient infection status. The expected result is Negative. Fact Sheet for Patients:  PinkCheek.be Fact Sheet for Healthcare Providers: GravelBags.it This test is not yet approved or cleared by the [place] FDA and  has been authorized for detection and/or diagnosis of SARS-CoV-2 by FDA under an Emergency Use Authorization (EUA).  This EUA will remain in effect (meaning this test can be used) for th e duration of  the COVID-19 declaration under Section 564(b)(1) of the Act, 21 U.S.C. section 360bbb-3(b)(1), unless the authorization is terminated or revoked sooner.    Influenza A by PCR NEGATIVE NEGATIVE Final   Influenza B by PCR NEGATIVE NEGATIVE Final    Comment: (NOTE) The Xpert Xpress SARS-CoV-2/FLU/RSV assay is intended as an aid in  the diagnosis of influenza from Nasopharyngeal swab specimens and  should not be used as a sole basis for treatment. Nasal washings and  aspirates are unacceptable for Xpert Xpress SARS-CoV-2/FLU/RSV  testing. Fact Sheet for Patients: PinkCheek.be Fact Sheet for Healthcare Providers: GravelBags.it This test is not yet approved or cleared by the [place] FDA and  has been authorized for detection and/or diagnosis of SARS-CoV-2 by  FDA under an Emergency Use Authorization (EUA). This EUA will remain  in effect (meaning this test can be used) for the duration of the  Covid-19 declaration under Section 564(b)(1) of the Act, 21  U.S.C. section 360bbb-3(b)(1), unless the authorization is  terminated  or revoked. Performed at [facility], [address], [place], [place] [address]      Radiology Studies: No results found.  Scheduled Meds: . vitamin C  500 mg Oral Daily  . aspirin  81 mg Oral Daily  . cholecalciferol  1,000 Units Oral Daily  . enoxaparin (LOVENOX) injection  40 mg Subcutaneous Q24H  . famotidine  20 mg Oral BID  . feeding supplement (PRO-STAT SUGAR FREE 64)  30 mL Per Tube BID BM  . folic acid  1 mg Oral Daily  . furosemide  40 mg Oral Daily  . losartan  50 mg Oral Daily   And  . hydrochlorothiazide  12.5 mg Oral Daily  . hydroxychloroquine  200 mg Oral Daily  .  insulin aspart  0-5 Units Subcutaneous QHS  . insulin aspart  0-9 Units Subcutaneous TID PC & HS  . lamoTRIgine  200 mg Oral BID  . lamoTRIgine  25 mg Oral BID  . magnesium oxide  400 mg Oral Daily  . metoprolol tartrate  12.5 mg Per Tube BID  . pantoprazole  40 mg Oral Daily  . polyethylene glycol  17 g Oral Daily  . potassium chloride  40 mEq Oral Once  . thiamine  100 mg Oral Daily  . traZODone  50 mg Oral QHS  . zinc sulfate  220 mg Oral Daily   Continuous Infusions: . remdesivir 100 mg in NS 100 mL 100 mg (07/04/19 1058)     LOS: 3 days   Time spent: 40 minutes.  Lorella Nimrod, MD [facility]  If 7PM-7AM, please contact night-coverage [email or web address]  07/05/2019, 7:28 AM   This record has been created using Dragon voice recognition software. Errors have been sought and corrected,but may not always be located. Such creation errors do not reflect on the standard of care.

## 2019-07-05 NOTE — Progress Notes (Signed)
Pt temperature elevated. Attempted to give tylenol by mouth but pt was difficult to arouse. MD notified. Rapid response called. New orders received. Hr 120s and temp 102.5. Icepacks placed under arms.

## 2019-07-05 NOTE — Progress Notes (Signed)
Pt hr increased sustaining in the 130s. MD notified. Awaiting orders.

## 2019-07-05 NOTE — Progress Notes (Signed)
Attempted to give oral potassium. Pt showing increasing confusion and weakness. Md notified. MD at bedside.

## 2019-07-05 NOTE — Significant Event (Signed)
Rapid Response Event Note  Overview: Time Called: [id number] Arrival Time: 1615 Event Type: Neurologic(temp 102.5)  Initial Focused Assessment: called for covid positive patient who has some AMS, and a temp of 102.5 axillary. Patient laying in bed, responsive to stimuli, BP and 02 sats good, tachycardic at present.  Interventions: Spoke with Dr Reesa Chew, gave rectal tylenol, checked ABG (which was ok), and using ice packs to get temp down.  Plan of Care (if not transferred): Nurse Christean Grief told to call if further assistance needed.  Event Summary: Name of Physician Notified: Dr Reesa Chew at 1620    at    Outcome: Stayed in room and stabalized  Event End Time: [place]

## 2019-07-05 NOTE — NC FL2 (Signed)
[place] LEVEL OF CARE SCREENING TOOL     IDENTIFICATION  Patient Name: Jeremiah [name]. Birthdate: October 30, 1940 Sex: male Admission Date (Current Location): 07/02/2019  [place] and [place] Number:  Engineering geologist and Address:  [facility], [address], [place], [place] [address]      Provider Number: [id number]  Attending Physician Name and Address:  Lorella Nimrod, MD  Relative Name and Phone Number:  Mic Butsch- wife [phone]    Current Level of Care: Hospital Recommended Level of Care: [place] Prior Approval Number:    Date Approved/Denied:   PASRR Number: [id number]  Discharge Plan: SNF    Current Diagnoses: Patient Active Problem List   Diagnosis Date Noted  . Calf tenderness   . Encephalopathy acute   . Generalized weakness   . Hypokalemia   . COVID-19 virus infection 07/02/2019  . Dysphagia   . Oral mucositis 04/14/2019  . Neutropenia with fever ([place]) 04/11/2019  . Acute metabolic encephalopathy [id number]  . Pancytopenia ([place])   . Cellulitis of lower extremity 04/08/2019  . Folic acid deficiency   . AKI (acute kidney injury) ([place])   . Pressure injury of skin 04/05/2019  . Other pancytopenia ([place])   . Cellulitis 03/31/2019  . Edema of both legs 08/19/2018  . History of allergy to latex 07/22/2018  . Lymphedema 06/16/2018  . Chronic venous insufficiency 06/16/2018  . Venous ulcer of leg ([place]) 06/16/2018  . PAD (peripheral artery disease) ([place]) 06/16/2018  . Diabetes mellitus type 2, uncomplicated ([place]) 0000000  . 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 Obesity (BMI 30.0-34.9) 02/27/2018  . Primary osteoarthritis of left knee 02/27/2018  . Urinary retention 02/25/2018  . Rheumatoid arthritis involving multiple sites ([place]) 02/25/2018  . Left knee pain 02/20/2018  . Neurogenic pain 10/02/2017  . Spondylosis without myelopathy or radiculopathy, lumbosacral region  07/17/2017  . Trigger point with back pain (recurrent/intermittent) 07/17/2017  . Chronic musculoskeletal pain 02/27/2017  . Myofascial pain syndrome 02/27/2017  . Long term current use of anticoagulant (Plaquenil) 02/27/2017  . Chronic lumbar radiculitis (L5 dermatome) (Right) 02/27/2017  . Chronic lumbar radiculitis (L3 dermatome) (Left) 02/27/2017  . Chronic foot pain (Right) 02/27/2017  . Chronic thoracic back pain (Bilateral) (R>L) 02/27/2017  . Chronic neck pain (posterior) (Bilateral) (R>L) 02/27/2017  . Chronic shoulder pain (Bilateral) (L>R) 02/27/2017  . 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 DDD (degenerative disc disease), lumbar 02/19/2017  . 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 Chronic knee pain (Bilateral) (R>L) 02/19/2017  . 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 Grade 1 Retrolisthesis of L1 over L2 02/19/2017  . T11 wedge compression fracture ([place]. 1993) 02/19/2017  . Lumbar facet arthropathy 02/19/2017  . Lumbar facet syndrome (Bilateral) 02/19/2017  . Lumbar foraminal stenosis (Bilateral: L1-2)  (Right: L3-4, L4-5) 02/19/2017  . 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 Disorder of skeletal system 02/19/2017  . Pharmacologic therapy 02/19/2017  . Problems influencing health status 02/19/2017  . Chronic pain syndrome 01/14/2017  . Gastroesophageal reflux disease 01/14/2017  . Hyperlipidemia 01/14/2017  . Lumbar radiculopathy 01/14/2017  . Postoperative anemia due to acute blood loss 01/14/2017  . Postprocedural hypotension 01/14/2017  . Chronic lower extremity pain (Secondary Area of Pain) (Bilateral) (R>L) 01/14/2017  . Rheumatoid arthritis ([place]) 12/17/2013  . Diabetes ([place]) 12/17/2013  . Benign essential hypertension 11/13/2013  . Chronic low back pain (Primary Area  of Pain) (Bilateral) (R>L) 11/13/2013  . Prostatitis 11/13/2013  . Seizure ([place]) 11/13/2013  . Left bundle branch block  05/21/2013  . Dorsalgia 05/21/2013    Orientation RESPIRATION BLADDER Height & Weight     Self, Time, Situation, Place  Normal Incontinent Weight: 104.3 kg Height:  6\' 1"  (185.4 cm)  BEHAVIORAL SYMPTOMS/MOOD NEUROLOGICAL BOWEL NUTRITION STATUS      Incontinent Diet(Heart Healthy Carb Modified)  AMBULATORY STATUS COMMUNICATION OF NEEDS Skin   Limited Assist Verbally Normal                       Personal Care Assistance Level of Assistance  Bathing, Feeding, Dressing Bathing Assistance: Limited assistance Feeding assistance: Independent Dressing Assistance: Limited assistance     Functional Limitations Info             SPECIAL CARE FACTORS FREQUENCY  PT (By licensed PT), OT (By licensed OT)     PT Frequency: 5 times per week OT Frequency: 5 times per week            Contractures Contractures Info: Not present    Additional Factors Info  Code Status, Allergies Code Status Info: Full Allergies Info: cefoperazone, PCN, latex           Current Medications (07/05/2019):  This is the current hospital active medication list Current Facility-Administered Medications  Medication Dose Route Frequency Provider Last Rate Last Admin  . acetaminophen (TYLENOL) tablet 650 mg  650 mg Oral Q6H PRN Mansy, Jan A, MD   650 mg at 07/04/19 1553  . ascorbic acid (VITAMIN C) tablet 500 mg  500 mg Oral Daily Mansy, Jan A, MD   500 mg at 07/05/19 0857  . aspirin chewable tablet 81 mg  81 mg Oral Daily Mansy, Jan A, MD   81 mg at 07/05/19 0858  . cholecalciferol (VITAMIN D3) tablet 1,000 Units  1,000 Units Oral Daily Mansy, Arvella Merles, MD   1,000 Units at 07/05/19 0858  . enoxaparin (LOVENOX) injection 40 mg  40 mg Subcutaneous Q24H Mansy, Jan A, MD   40 mg at 07/04/19 2127  . famotidine (PEPCID) tablet 20 mg  20 mg Oral BID Mansy, Jan A, MD   20 mg at 07/05/19 0857  . feeding supplement (PRO-STAT SUGAR FREE 64) liquid 30 mL  30 mL Per Tube BID BM Mansy, Jan A, MD   30 mL at 07/05/19  0858  . folic acid (FOLVITE) tablet 1 mg  1 mg Oral Daily Mansy, Jan A, MD   1 mg at 07/05/19 0858  . furosemide (LASIX) tablet 40 mg  40 mg Oral Daily Mansy, Jan A, MD   40 mg at 07/05/19 0857  . losartan (COZAAR) tablet 50 mg  50 mg Oral Daily Mansy, Jan A, MD   50 mg at 07/05/19 [id number]   And  . hydrochlorothiazide (MICROZIDE) capsule 12.5 mg  12.5 mg Oral Daily Mansy, Jan A, MD   12.5 mg at 07/05/19 0857  . hydroxychloroquine (PLAQUENIL) tablet 200 mg  200 mg Oral Daily Mansy, Jan A, MD   200 mg at 07/05/19 0859  . insulin aspart (novoLOG) injection 0-5 Units  0-5 Units Subcutaneous QHS Mansy, Jan A, MD      . insulin aspart (novoLOG) injection 0-9 Units  0-9 Units Subcutaneous TID PC & HS Mansy, Jan A, MD      . lamoTRIgine (LAMICTAL) tablet 200 mg  200 mg Oral BID Mansy, Arvella Merles, MD  200 mg at 07/05/19 0856  . lamoTRIgine (LAMICTAL) tablet 25 mg  25 mg Oral BID Mansy, Jan A, MD   25 mg at 07/05/19 0859  . magnesium hydroxide (MILK OF MAGNESIA) suspension 30 mL  30 mL Oral Daily PRN Mansy, Jan A, MD      . magnesium oxide (MAG-OX) tablet 400 mg  400 mg Oral Daily Mansy, Jan A, MD   400 mg at 07/05/19 0856  . metoprolol tartrate (LOPRESSOR) tablet 12.5 mg  12.5 mg Per Tube BID Mansy, Jan A, MD   12.5 mg at 07/05/19 0856  . ondansetron (ZOFRAN) tablet 4 mg  4 mg Oral Q6H PRN Mansy, Jan A, MD       Or  . ondansetron [facility]) injection 4 mg  4 mg Intravenous Q6H PRN Mansy, Jan A, MD      . pantoprazole (PROTONIX) EC tablet 40 mg  40 mg Oral Daily Mansy, Jan A, MD   40 mg at 07/05/19 0858  . polyethylene glycol (MIRALAX / GLYCOLAX) packet 17 g  17 g Oral Daily Mansy, Jan A, MD   17 g at 07/05/19 0858  . remdesivir 100 mg in sodium chloride 0.9 % 100 mL IVPB  100 mg Intravenous Daily Mansy, Jan A, MD 200 mL/hr at 07/05/19 1037 100 mg at 07/05/19 1037  . thiamine tablet 100 mg  100 mg Oral Daily Mansy, Jan A, MD   100 mg at 07/05/19 0855  . traZODone (DESYREL) tablet 25 mg  25 mg Oral QHS PRN Mansy,  Jan A, MD      . traZODone (DESYREL) tablet 50 mg  50 mg Oral QHS Mansy, Jan A, MD   50 mg at 07/04/19 2127  . zinc sulfate capsule 220 mg  220 mg Oral Daily Mansy, Jan A, MD   220 mg at 07/05/19 [id number]     Discharge Medications: Please see discharge summary for a list of discharge medications.  Relevant Imaging Results:  Relevant Lab Results:   Additional Information SSN [id number]  Shelbie Hutching, RN

## 2019-07-06 LAB — CBC WITH DIFFERENTIAL/PLATELET
Abs Immature Granulocytes: 0.06 10*3/uL (ref 0.00–0.07)
Basophils Absolute: 0 10*3/uL (ref 0.0–0.1)
Basophils Relative: 0 %
Eosinophils Absolute: 0 10*3/uL (ref 0.0–0.5)
Eosinophils Relative: 0 %
HCT: 35.9 % — ABNORMAL LOW (ref 39.0–52.0)
Hemoglobin: 11.9 g/dL — ABNORMAL LOW (ref 13.0–17.0)
Immature Granulocytes: 1 %
Lymphocytes Relative: 12 %
Lymphs Abs: 1.4 10*3/uL (ref 0.7–4.0)
MCH: 27.8 pg (ref 26.0–34.0)
MCHC: 33.1 g/dL (ref 30.0–36.0)
MCV: 83.9 fL (ref 80.0–100.0)
Monocytes Absolute: 1.4 10*3/uL — ABNORMAL HIGH (ref 0.1–1.0)
Monocytes Relative: 12 %
Neutro Abs: 8.7 10*3/uL — ABNORMAL HIGH (ref 1.7–7.7)
Neutrophils Relative %: 75 %
Platelets: 269 10*3/uL (ref 150–400)
RBC: 4.28 MIL/uL (ref 4.22–5.81)
RDW: 15.2 % (ref 11.5–15.5)
WBC: 11.6 10*3/uL — ABNORMAL HIGH (ref 4.0–10.5)
nRBC: 0 % (ref 0.0–0.2)

## 2019-07-06 LAB — URINALYSIS, COMPLETE (UACMP) WITH MICROSCOPIC
Bacteria, UA: NONE SEEN
Bilirubin Urine: NEGATIVE
Glucose, UA: NEGATIVE mg/dL
Hgb urine dipstick: NEGATIVE
Ketones, ur: 5 mg/dL — AB
Leukocytes,Ua: NEGATIVE
Nitrite: NEGATIVE
Protein, ur: 30 mg/dL — AB
Specific Gravity, Urine: 1.038 — ABNORMAL HIGH (ref 1.005–1.030)
Squamous Epithelial / HPF: NONE SEEN (ref 0–5)
pH: 6 (ref 5.0–8.0)

## 2019-07-06 LAB — FERRITIN: Ferritin: 181 ng/mL (ref 24–336)

## 2019-07-06 LAB — GLUCOSE, CAPILLARY
Glucose-Capillary: 99 mg/dL (ref 70–99)
Glucose-Capillary: 82 mg/dL (ref 70–99)
Glucose-Capillary: 86 mg/dL (ref 70–99)
Glucose-Capillary: 91 mg/dL (ref 70–99)
Glucose-Capillary: 80 mg/dL (ref 70–99)
Glucose-Capillary: 88 mg/dL (ref 70–99)
Glucose-Capillary: 75 mg/dL (ref 70–99)
Glucose-Capillary: 86 mg/dL (ref 70–99)

## 2019-07-06 LAB — COMPREHENSIVE METABOLIC PANEL
ALT: 9 U/L (ref 0–44)
AST: 13 U/L — ABNORMAL LOW (ref 15–41)
Albumin: 2.5 g/dL — ABNORMAL LOW (ref 3.5–5.0)
Alkaline Phosphatase: 92 U/L (ref 38–126)
Anion gap: 10 (ref 5–15)
BUN: 19 mg/dL (ref 8–23)
CO2: 25 mmol/L (ref 22–32)
Calcium: 8.3 mg/dL — ABNORMAL LOW (ref 8.9–10.3)
Chloride: 100 mmol/L (ref 98–111)
Creatinine, Ser: 0.9 mg/dL (ref 0.61–1.24)
GFR calc Af Amer: >60 mL/min (ref >60)
GFR calc non Af Amer: >60 mL/min (ref >60)
Glucose, Bld: 98 mg/dL (ref 70–99)
Potassium: 3.1 mmol/L — ABNORMAL LOW (ref 3.5–5.1)
Sodium: 135 mmol/L (ref 135–145)
Total Bilirubin: 0.8 mg/dL (ref 0.3–1.2)
Total Protein: 6.8 g/dL (ref 6.5–8.1)

## 2019-07-06 LAB — FIBRIN DERIVATIVES D-DIMER (ARMC ONLY): Fibrin derivatives D-dimer (ARMC): 2349.2 ng/mL (FEU) — ABNORMAL HIGH (ref 0.00–499.00)

## 2019-07-06 LAB — PROCALCITONIN: Procalcitonin: 0.83 ng/mL

## 2019-07-06 LAB — C-REACTIVE PROTEIN: CRP: 28.2 mg/dL — ABNORMAL HIGH (ref <1.0)

## 2019-07-06 MED ORDER — CHLORHEXIDINE GLUCONATE CLOTH 2 % EX PADS
6.0000 | MEDICATED_PAD | Freq: Every day | CUTANEOUS | Status: DC
  Administered 2019-07-06 – 2019-07-10 (×5): 6 via TOPICAL

## 2019-07-06 MED ORDER — LACTATED RINGERS IV BOLUS
500.0000 mL | Freq: Once | INTRAVENOUS | Status: AC
  Administered 2019-07-06: 08:00:00 500 mL via INTRAVENOUS

## 2019-07-06 MED ORDER — METOPROLOL TARTRATE 5 MG/5ML IV SOLN: 2.5000 mg | Freq: Four times a day (QID) | INTRAVENOUS | Status: DC | PRN

## 2019-07-06 MED ORDER — SODIUM CHLORIDE 0.9 % IV BOLUS
1000.0000 mL | Freq: Once | INTRAVENOUS | Status: AC
  Administered 2019-07-06: 1000 mL via INTRAVENOUS

## 2019-07-06 MED ORDER — POTASSIUM CHLORIDE CRYS ER 20 MEQ PO TBCR
40.0000 meq | EXTENDED_RELEASE_TABLET | Freq: Once | ORAL | Status: AC
  Administered 2019-07-06: 20:00:00 40 meq via ORAL
  Filled 2019-07-06: qty 2

## 2019-07-06 MED ORDER — VANCOMYCIN HCL 750 MG/150ML IV SOLN
750.0000 mg | Freq: Two times a day (BID) | INTRAVENOUS | Status: DC
  Administered 2019-07-07 – 2019-07-08 (×4): 750 mg via INTRAVENOUS
  Filled 2019-07-06 (×7): qty 150

## 2019-07-06 MED ORDER — LACTATED RINGERS IV BOLUS
500.0000 mL | Freq: Once | INTRAVENOUS | Status: AC
  Administered 2019-07-06: 500 mL via INTRAVENOUS

## 2019-07-06 MED ORDER — SODIUM CHLORIDE 0.9 % IV SOLN: 250.0000 mL | INTRAVENOUS | Status: DC

## 2019-07-06 MED ORDER — NYSTATIN 100000 UNIT/ML MT SUSP
5.0000 mL | Freq: Four times a day (QID) | OROMUCOSAL | Status: DC
  Administered 2019-07-06 – 2019-07-09 (×15): 500000 [IU] via ORAL
  Filled 2019-07-06 (×15): qty 5

## 2019-07-06 MED ORDER — VANCOMYCIN HCL 2000 MG/400ML IV SOLN
2000.0000 mg | Freq: Once | INTRAVENOUS | Status: AC
  Administered 2019-07-06: 2000 mg via INTRAVENOUS
  Filled 2019-07-06: qty 400

## 2019-07-06 NOTE — Progress Notes (Signed)
   Vital Signs MEWS/VS Documentation      07/05/2019 2308 07/06/2019 0300 07/06/2019 0700 07/06/2019 0730   MEWS Score:  2  2  2  5    MEWS Score Color:  Yellow  Yellow  Yellow  Red   Resp:  --  --  --  18   Pulse:  --  (!) 117  --  (!) 115   BP:  --  118/66  --  (!) 95/48   Temp:  99.2 F (37.3 C)  --  --  (!) 102.3 F (39.1 C)   O2 Device:  --  --  --  Room Air   Level of Consciousness:  --  --  --  Alert        MD notified.    Cline Draheim P Mayela Bullard 07/06/2019,8:15 AM

## 2019-07-06 NOTE — Progress Notes (Signed)
Pharmacy Antibiotic Note  Jeremiah Banka. is a 79 y.o. male admitted on 07/02/2019 with AMS,  generalized weakness and COVID-19 infection. Rapid response called 2/14 for elevated temp of 102.5 and tachycardia. Patient started on meropenem for Sepsis.  2/15 Tmax: 102.3, BP: 95/48, and HR: 115.   Pharmacy consulted for vancomycin dosing.   Patient does have a reported anaphylaxis allergy to [facility]  Plan: 1. Will order vancomycin 2000mg  IV x 1 dose, followed by Vancomycin 750 mg IV Q 12 hrs. Goal AUC 400-550. Expected AUC: 417 SCr used: 12  2. Continue meropenem 1g IV every 8 hours.    Height: 6\' 1"  (185.4 cm) Weight: 230 lb (104.3 kg) IBW/kg (Calculated) : 79.9  Temp (24hrs), Avg:100.4 F (38 C), Min:98.3 F (36.8 C), Max:102.5 F (39.2 C)  Recent Labs  Lab 07/02/19 1627 07/03/19 0742 07/04/19 0548 07/05/19 0552 07/05/19 1929 07/05/19 2212 07/06/19 0349  WBC 8.7 6.8 7.1 6.9  --   --  11.6*  CREATININE 1.16 0.88 0.79 0.86  --   --  0.90  LATICACIDVEN 1.1  --   --   --  2.1* 1.3  --     Estimated Creatinine Clearance: 87.2 mL/min (by C-G formula based on SCr of 0.9 mg/dL).    Allergies  Allergen Reactions  . Cefoperazone Anaphylaxis  . Penicillins Anaphylaxis and Other (See Comments)    Has patient had a PCN reaction causing immediate rash, facial/tongue/throat swelling, SOB or lightheadedness with hypotension: Yes Has patient had a PCN reaction causing severe rash involving mucus membranes or skin necrosis: No Has patient had a PCN reaction that required hospitalization No Has patient had a PCN reaction occurring within the last 10 years: No If all of the above answers are "NO", then may proceed with Cephalosporin use.  . Latex Rash    Antimicrobials this admission: 2/11 Vanc.Flagyl, Aztreonam x 1  2/11 Remdesivir>>  2/14 meropenem>> 2/15 vancomycin   Microbiology results:  2/14 BCx: NG TD 2/11 UCx:  NG final  2/14  MRSA PCR: negative  2/11 SARS Coronavirus 2:  positive   Thank you for allowing pharmacy to be a part of this patient's care.  Pernell Dupre, PharmD, BCPS Clinical Pharmacist 07/06/2019 10:37 AM

## 2019-07-06 NOTE — Significant Event (Signed)
Rapid Response Event Note  Overview: Time Called: 1002 Arrival Time: 1015 Event Type: Hypotension  Initial Focused Assessment: BP low when arrived to bedside. Primary RN informed me patient was receiving second LR bolus and BP would not sustain. Had already paged MD, awaiting call back. Once I got to room, MD arrived immediately after. Per RN neuro status had also declined. Lungs diminished bilateral, heart sounds WNL.  Interventions: Patient transferred to unit for possible vasopressors and closer monitoring.  Plan of Care (if not transferred):  Event Summary: Name of Physician Notified: Dr Reesa Chew at 1005    at    Outcome: Transferred (Comment)(to ICU-13)  Event End Time: [place]

## 2019-07-06 NOTE — Progress Notes (Signed)
PROGRESS NOTE    Vevelyn [name].  [id number] DOB: 18-Apr-1941 DOA: 07/02/2019 PCP: Derinda Late, MD   Brief Narrative:  Jeremiah [name] a49 y.o.Caucasian malewith a known history of rheumatoid arthritis on methotrexate, type 2 diabetes mellitus, hypertension, PAD, GERD and seizure, who presented to the emergency room with acute onset ofgeneralized weakness and low level of energy with associated body aches that started over the weekend and got worse since Monday. He has been having fever and cold chills today. He admits to dry cough over the last 2 to 3 days without dyspnea or wheezing. His wife stated that he has been having altered mental status. No chest pain or palpitations. No nausea or vomiting or diarrhea. No dysuria, oliguria or hematuria or flank pain. He denied any loss of taste or smell.  Upon presenting to the emergency room, temperature was 102.1 with heart rate of 112 with otherwise normal vital signs.  PCR positive  Subjective: Overnight nursing concern of being febrile, hypoglycemic and more lethargic. Pt. Becomes more lethergic and hypotensive.  Rapid response was called due to worsening hypotension.  Assessment & Plan:   Active Problems:   COVID-19 virus infection   Encephalopathy acute   Generalized weakness   Hypokalemia   Calf tenderness  COVID-19.Elevated inflammatory markers with worsening.  Saturating well on room air.  Urine and blood cultures are with no growth.  Started developing fever again after remaining afebrile for 2 days. -Continue  on IV remdesivir-day 5. -Continue on vitamin D3, vitamin C, zinc sulfate, p.o. Pepcid - continue aspirin. -Monitor inflammatory markers.  Septic Shock. Patient become febrile over the last 24-hour up to 102.3, become more lethargic.Rapid response was called last night due to decreased responsiveness, ABG with mild alkalosis and mild hypoxia, no CO2 retention.  Procalcitonin elevated.  MRSA swab  negative.  CTA without PE or any significant lung abnormalities.  Developed mild lactic acidosis which resolved with IV fluid. Again become febrile with decreased responsiveness this morning.  Become hypotensive, appears very dusky, remained hypotensive despite 1 L of fluid. -Ordered another liter of bolus. -Start him on pressors if remain hypotensive. -Add vancomycin for empirical coverage although MRSA swab was negative, procalcitonin continue to get worse. -Patient was started on meropenem yesterday for empiric coverage due to his allergies with penicillin and cephalosporin. -Transfer him to ICU. -Repeat urine and blood culture results are pending.   Acute encephalopathy.  Likely metabolic from infection plus underlying dementia.  Per son , pt also has underlying dementia. He was more lethargic and dusky today. Worsening mental status today. - LE venous doppler was negative for DVT -CTA to r/o PE-negative for PE -PCT elevated with worsening today. - PT Evaluation-Recommending SNF placement. -Social worker consult to find a bed.  Telemetry concerns.  No new concerns.  Paged by nursing staff yesterday evening that they received a call from telemetry that patient is having ST elevation.  Patient remained completely asymptomatic, no chest pain or shortness of breath. EKG shows sinus rhythm, left axis with left bundle branch block, J point elevation in septal leads,similar to the KG on admission. -Check troponin- barely positive at 29, most likely demand. -Most likely a false alarm.  3. Rheumatoid arthritis. -continue chloroquine. He was taken off Methotrexate per son. Last dose was sometime this week.  4. Type II diabetes mellitus, well controlled.  CBG within goal. -Continue supplemental coverage with NovoLog as needed. -Last hemoglobin A1c was 5.7 and August 2020.  5. Seizure disorder. -Continue Lamictal.  6. Hypertension. -Holding Hyzaar and Lopressor due to  hypotension.  7.Hypokalemia.  Potassium at 3.1. -Check magnesium-2.2 Replace K Monitor level  Objective: Vitals:   07/06/19 1012 07/06/19 1055 07/06/19 1100 07/06/19 1145  BP: (!) 102/40 107/60 104/60 107/68  Pulse: (!) 104 (!) 108 (!) 104 (!) 104  Resp: 15 17 15 14   Temp:   98 F (36.7 C)   TempSrc:   Oral   SpO2:  95% 94% 96%  Weight:      Height:        Intake/Output Summary (Last 24 hours) at 07/06/2019 1258 Last data filed at 07/06/2019 1100 Gross per 24 hour  Intake 1154.85 ml  Output 350 ml  Net 804.85 ml   Filed Weights   07/02/19 1613  Weight: 104.3 kg    Examination:  General exam: Appears very lethargic and dusky. Respiratory system: Clear to auscultation. Respiratory effort normal. Cardiovascular system: S1 & S2 heard, RRR. No JVD, murmurs, rubs, gallops or clicks. Gastrointestinal system: Soft, nontender, nondistended, bowel sounds positive. Central nervous system: Alert and oriented to self today, Able to follow commands. No focal neurological deficits. Extremities: 1+ edema, no cyanosis, pulses intact and symmetrical. Psychiatry: Judgement and insight appear impaired .    DVT prophylaxis: Lovenox Code Status: Full Family Communication: Son was updated on phone that he has been transferred to ICU due to septic shock.. Disposition Plan: Pending improvement, patient developed septic shock, currently very guarded prognosis.  Consultants:   PCCM  Procedures:  Antimicrobials:  Meropenem Vancomycin  Data Reviewed: I have personally reviewed following labs and imaging studies  CBC: Recent Labs  Lab 07/02/19 1627 07/03/19 0742 07/04/19 0548 07/05/19 0552 07/06/19 0349  WBC 8.7 6.8 7.1 6.9 11.6*  NEUTROABS 7.3 5.2 5.0 4.5 8.7*  HGB 11.6* 11.0* 10.4* 11.3* 11.9*  HCT 37.0* 35.0* 32.9* 34.8* 35.9*  MCV 86.7 86.8 85.2 84.1 83.9  PLT 261 219 220 228 [id number]   Basic Metabolic Panel: Recent Labs  Lab 07/02/19 1627 07/03/19 0742 07/04/19 0548  07/05/19 0552 07/06/19 0349  NA 138 139 136 137 135  K 3.4* 3.1* 3.1* 3.3* 3.1*  CL 101 105 103 103 100  CO2 27 27 25 26 25   GLUCOSE 114* 112* 103* 99 98  BUN 20 19 17 18 19   CREATININE 1.16 0.88 0.79 0.86 0.90  CALCIUM 9.0 8.7* 8.5* 8.7* 8.3*  MG  --   --  1.9 2.0  --    GFR: Estimated Creatinine Clearance: 87.2 mL/min (by C-G formula based on SCr of 0.9 mg/dL). Liver Function Tests: Recent Labs  Lab 07/02/19 1627 07/03/19 0742 07/04/19 0548 07/05/19 0552 07/06/19 0349  AST 16 13* 11* 11* 13*  ALT 9 10 8 9 9   ALKPHOS 107 102 86 92 92  BILITOT 1.0 0.6 1.0 0.8 0.8  PROT 7.6 7.0 6.4* 6.7 6.8  ALBUMIN 3.2* 2.9* 2.6* 2.6* 2.5*   Recent Labs  Lab 07/02/19 1627  LIPASE 25   No results for input(s): AMMONIA in the last 168 hours. Coagulation Profile: Recent Labs  Lab 07/02/19 1627  INR 1.1   Cardiac Enzymes: No results for input(s): CKTOTAL, CKMB, CKMBINDEX, TROPONINI in the last 168 hours. BNP (last 3 results) No results for input(s): PROBNP in the last 8760 hours. HbA1C: No results for input(s): HGBA1C in the last 72 hours. CBG: Recent Labs  Lab 07/05/19 2221 07/06/19 0125 07/06/19 0428 07/06/19 0736 07/06/19 1047  GLUCAP 110* 99 82 86 91   Lipid Profile: No  results for input(s): CHOL, HDL, LDLCALC, TRIG, CHOLHDL, LDLDIRECT in the last 72 hours. Thyroid Function Tests: No results for input(s): TSH, T4TOTAL, FREET4, T3FREE, THYROIDAB in the last 72 hours. Anemia Panel: Recent Labs    07/05/19 0552 07/06/19 0349  FERRITIN 152 181   Sepsis Labs: Recent Labs  Lab 07/02/19 1627 07/05/19 0552 07/05/19 1929 07/05/19 2212 07/06/19 0349  PROCALCITON 0.14 0.36  --   --  0.83  LATICACIDVEN 1.1  --  2.1* 1.3  --     Recent Results (from the past 240 hour(s))  Blood Culture (routine x 2)     Status: None (Preliminary result)   Collection Time: 07/02/19  4:27 PM   Specimen: BLOOD  Result Value Ref Range Status   Specimen Description BLOOD Blood  Culture adequate volume  Final   Special Requests   Final    BOTTLES DRAWN AEROBIC AND ANAEROBIC LEFT ANTECUBITAL   Culture   Final    NO GROWTH 4 DAYS Performed at [facility], [address], [place], [place] [address]    Report Status PENDING  Incomplete  Urine culture     Status: None   Collection Time: 07/02/19  5:16 PM   Specimen: Urine, Random  Result Value Ref Range Status   Specimen Description   Final    URINE, RANDOM Performed at [facility], [address] [place], [place] [address]    Special Requests   Final    NONE Performed at [facility], [address] [place], [place] [address]    Culture   Final    NO GROWTH Performed at [facility], [place] [address] [place],  [address]    Report Status 07/04/2019 FINAL  Final  Respiratory Panel by RT PCR (Flu A&B, Covid) - Nasopharyngeal Swab     Status: Abnormal   Collection Time: 07/02/19  6:35 PM   Specimen: Nasopharyngeal Swab  Result Value Ref Range Status   SARS Coronavirus 2 by RT PCR POSITIVE (A) NEGATIVE Final    Comment: RESULT CALLED TO, READ BACK BY AND VERIFIED WITH: ANDREA BRYANT 07/02/19 1940 KLW (NOTE) SARS-CoV-2 target nucleic acids are DETECTED. SARS-CoV-2 RNA is generally detectable in upper respiratory specimens  during the acute phase of infection. Positive results are indicative of the presence of the identified virus, but do not rule out bacterial infection or co-infection with other pathogens not detected by the test. Clinical correlation with patient history and other diagnostic information is necessary to determine patient infection status. The expected result is Negative. Fact Sheet for Patients:  PinkCheek.be Fact Sheet for Healthcare Providers: GravelBags.it This test is not yet approved or cleared by the [place] FDA and  has been authorized for detection and/or diagnosis of  SARS-CoV-2 by FDA under an Emergency Use Authorization (EUA).  This EUA will remain in effect (meaning this test can be used) for th e duration of  the COVID-19 declaration under Section 564(b)(1) of the Act, 21 U.S.C. section 360bbb-3(b)(1), unless the authorization is terminated or revoked sooner.    Influenza A by PCR NEGATIVE NEGATIVE Final   Influenza B by PCR NEGATIVE NEGATIVE Final    Comment: (NOTE) The Xpert Xpress SARS-CoV-2/FLU/RSV assay is intended as an aid in  the diagnosis of influenza from Nasopharyngeal swab specimens and  should not be used as a sole basis for treatment. Nasal washings and  aspirates are unacceptable for Xpert Xpress SARS-CoV-2/FLU/RSV  testing. Fact Sheet for Patients: PinkCheek.be Fact Sheet  for Healthcare Providers: GravelBags.it This test is not yet approved or cleared by the [place] and  has been authorized for detection and/or diagnosis of SARS-CoV-2 by  FDA under an Emergency Use Authorization (EUA). This EUA will remain  in effect (meaning this test can be used) for the duration of the  Covid-19 declaration under Section 564(b)(1) of the Act, 21  U.S.C. section 360bbb-3(b)(1), unless the authorization is  terminated or revoked. Performed at [facility], [address] [place], [place] [address]   CULTURE, BLOOD (ROUTINE X 2) w Reflex to ID Panel     Status: None (Preliminary result)   Collection Time: 07/05/19  7:42 PM   Specimen: BLOOD  Result Value Ref Range Status   Specimen Description BLOOD LEFT ANTECUBITAL  Final   Special Requests   Final    BOTTLES DRAWN AEROBIC ONLY Blood Culture adequate volume   Culture   Final    NO GROWTH < 12 HOURS Performed at [facility], [address] [place], [place] [address]    Report Status PENDING  Incomplete  CULTURE, BLOOD (ROUTINE X 2) w Reflex to ID Panel     Status: None (Preliminary result)    Collection Time: 07/05/19  7:45 PM   Specimen: BLOOD  Result Value Ref Range Status   Specimen Description BLOOD LEFT HAND  Final   Special Requests   Final    BOTTLES DRAWN AEROBIC ONLY Blood Culture adequate volume   Culture   Final    NO GROWTH < 12 HOURS Performed at [facility], [address], [place], [place] [address]    Report Status PENDING  Incomplete  MRSA PCR Screening     Status: None   Collection Time: 07/05/19  7:55 PM   Specimen: Nasal Mucosa; Nasopharyngeal  Result Value Ref Range Status   MRSA by PCR NEGATIVE NEGATIVE Final    Comment:        The GeneXpert MRSA Assay (FDA approved for NASAL specimens only), is one component of a comprehensive MRSA colonization surveillance program. It is not intended to diagnose MRSA infection nor to guide or monitor treatment for MRSA infections. Performed at [facility], [address] [place], [place] [address]      Radiology Studies: CT ANGIO CHEST PE W OR WO CONTRAST  Result Date: 07/05/2019 CLINICAL DATA:  Shortness of breath, COVID-19 positive, worsening mental status EXAM: CT ANGIOGRAPHY CHEST WITH CONTRAST TECHNIQUE: Multidetector CT imaging of the chest was performed using the standard protocol during bolus administration of intravenous contrast. Multiplanar CT image reconstructions and MIPs were obtained to evaluate the vascular anatomy. CONTRAST:  82mL OMNIPAQUE IOHEXOL 350 MG/ML SOLN COMPARISON:  07/02/2019 FINDINGS: Cardiovascular: This is a technically adequate evaluation of the pulmonary vasculature. No filling defects or pulmonary emboli. The heart is unremarkable without pericardial effusion. Moderate atherosclerosis of the coronary vessels greatest in the LAD distribution. Normal caliber of the thoracic aorta without dissection. Mediastinum/Nodes: No enlarged mediastinal, hilar, or axillary lymph nodes. Thyroid gland, trachea, and esophagus demonstrate no significant findings. Lungs/Pleura:  No acute airspace disease, effusion, or pneumothorax. The central airways are patent. Upper Abdomen: There is a large amount of retained stool within the visible colon. Upper abdomen is otherwise unremarkable. Musculoskeletal: No acute or destructive bony lesions. Reconstructed images demonstrate no additional findings. Review of the MIP images confirms the above findings. IMPRESSION: 1. No CT evidence of pulmonary embolism. 2. No acute intrathoracic findings. 3. Large amount of retained stool  within the visible colon. Electronically Signed   By: Randa Ngo M.D.   On: 07/05/2019 15:36   US Venous Img Lower Bilateral (DVT)  Result Date: 07/05/2019 CLINICAL DATA:  Calf tenderness and lower extremity edema. EXAM: BILATERAL LOWER EXTREMITY VENOUS DOPPLER ULTRASOUND TECHNIQUE: Gray-scale sonography with graded compression, as well as color Doppler and duplex ultrasound were performed to evaluate the lower extremity deep venous systems from the level of the common femoral vein and including the common femoral, femoral, profunda femoral, popliteal and calf veins including the posterior tibial, peroneal and gastrocnemius veins when visible. The superficial great saphenous vein was also interrogated. Spectral Doppler was utilized to evaluate flow at rest and with distal augmentation maneuvers in the common femoral, femoral and popliteal veins. COMPARISON:  None. FINDINGS: RIGHT LOWER EXTREMITY Common Femoral Vein: No evidence of thrombus. Normal compressibility, respiratory phasicity and response to augmentation. Saphenofemoral Junction: No evidence of thrombus. Normal compressibility and flow on color Doppler imaging. Profunda Femoral Vein: No evidence of thrombus. Normal compressibility and flow on color Doppler imaging. Femoral Vein: No evidence of thrombus. Normal compressibility, respiratory phasicity and response to augmentation. Popliteal Vein: No evidence of thrombus. Normal compressibility, respiratory  phasicity and response to augmentation. Calf Veins: No evidence of thrombus. Normal compressibility and flow on color Doppler imaging. Superficial Great Saphenous Vein: No evidence of thrombus. Normal compressibility. Venous Reflux:  None. Other Findings: No evidence of superficial thrombophlebitis or abnormal fluid collection. LEFT LOWER EXTREMITY Common Femoral Vein: No evidence of thrombus. Normal compressibility, respiratory phasicity and response to augmentation. Saphenofemoral Junction: No evidence of thrombus. Normal compressibility and flow on color Doppler imaging. Profunda Femoral Vein: No evidence of thrombus. Normal compressibility and flow on color Doppler imaging. Femoral Vein: No evidence of thrombus. Normal compressibility, respiratory phasicity and response to augmentation. Popliteal Vein: No evidence of thrombus. Normal compressibility, respiratory phasicity and response to augmentation. Calf Veins: No evidence of thrombus. Normal compressibility and flow on color Doppler imaging. Superficial Great Saphenous Vein: No evidence of thrombus. Normal compressibility. Venous Reflux:  None. Other Findings: No evidence of superficial thrombophlebitis or abnormal fluid collection. IMPRESSION: No evidence of deep venous thrombosis in either lower extremity. Electronically Signed   By: Aletta Edouard M.D.   On: 07/05/2019 14:20    Scheduled Meds: . vitamin C  500 mg Oral Daily  . aspirin  81 mg Oral Daily  . Chlorhexidine Gluconate Cloth  6 each Topical Daily  . cholecalciferol  1,000 Units Oral Daily  . enoxaparin (LOVENOX) injection  40 mg Subcutaneous Q24H  . famotidine  20 mg Oral BID  . feeding supplement (PRO-STAT SUGAR FREE 64)  30 mL Per Tube BID BM  . folic acid  1 mg Oral Daily  . losartan  50 mg Oral Daily   And  . hydrochlorothiazide  12.5 mg Oral Daily  . hydroxychloroquine  200 mg Oral Daily  . insulin aspart  0-9 Units Subcutaneous TID AC & HS  . lamoTRIgine  200 mg Oral BID   . lamoTRIgine  25 mg Oral BID  . magnesium oxide  400 mg Oral Daily  . mouth rinse  15 mL Mouth Rinse BID  . metoprolol tartrate  12.5 mg Per Tube BID  . pantoprazole  40 mg Oral Daily  . polyethylene glycol  17 g Oral Daily  . thiamine  100 mg Oral Daily  . traZODone  50 mg Oral QHS  . zinc sulfate  220 mg Oral Daily   Continuous Infusions: .  sodium chloride 100 mL/hr at 07/06/19 0300  . sodium chloride Stopped (07/06/19 1123)  . meropenem (MERREM) IV 1 g (07/06/19 0429)  . remdesivir 100 mg in NS 100 mL Stopped (07/05/19 1107)  . vancomycin    . [START ON 07/07/2019] vancomycin       LOS: 4 days   Time spent: 50 minutes.  Lorella Nimrod, MD [facility]  If 7PM-7AM, please contact night-coverage [email or web address]  07/06/2019, 12:58 PM   This record has been created using Systems analyst. Errors have been sought and corrected,but may not always be located. Such creation errors do not reflect on the standard of care.

## 2019-07-06 NOTE — Progress Notes (Signed)
Patients HR has been increasing form low sinus tach to sinus tach in 130's. MD informed. Will continue to monitor. No new orders at this time

## 2019-07-06 NOTE — Progress Notes (Signed)
[facility] received rapid response page for pt. in 1C (COVID unit); after discussion w/charge nurse via phone, it was determined [place] support not necessary at this time.  Pt. transferred to ICU; [facility] consulted w/pt.'s RN; pt. reportedly stable.  [place] will follow up as needed.       07/06/19 1330  Clinical Encounter Type  Visited With Health care provider  Visit Type Initial;Critical Care (Rapid Response)  Referral From Nurse  Consult/Referral To Chaplain

## 2019-07-06 NOTE — Progress Notes (Signed)
OT Cancellation Note  Patient Details Name: Jeremiah [name]. MRN: [id number] DOB: 1941-05-08   Cancelled Treatment:    Reason Eval/Treat Not Completed: Other (comment). Per chart review, patient noted with transfer to CCU; continue upon transfer order received. Though will hold patient at this time to ensure medical stability after transfer.  Will continue to follow and resume as medically appropriate and available.  Jeni Salles, MPH, MS, OTR/L ascom [phone] 07/06/19, 11:43 AM

## 2019-07-06 NOTE — Evaluation (Signed)
Clinical/Bedside Swallow Evaluation Patient Details  Name: Jeremiah [name]. MRN: [id number] Date of Birth: [birth year]  Today's Date: 07/06/2019 Time: SLP Start Time (ACUTE ONLY): 1455 SLP Stop Time (ACUTE ONLY): 1540 SLP Time Calculation (min) (ACUTE ONLY): 45 min  Past Medical History:  Past Medical History:  Diagnosis Date  . Allergy   . Arthritis   . Collagen vascular disease ([place])   . Hypertension   . Rheumatoid arthritis ([place])   . Seizures ([place])   . Sepsis ([place]) 05/23/2015   Past Surgical History:  Past Surgical History:  Procedure Laterality Date  . BACK SURGERY    . back surgery five times  04/2016  . CARPAL TUNNEL RELEASE    . HERNIA REPAIR    . JOINT REPLACEMENT    . knee replacement right    . PEG PLACEMENT N/A 04/24/2019   Procedure: PERCUTANEOUS ENDOSCOPIC GASTROSTOMY (PEG) PLACEMENT;  Surgeon: Toledo, Benay Pike, MD;  Location: [facility] ENDOSCOPY;  Service: Gastroenterology;  Laterality: N/A;  . right hip replacement    . rotator cuff surgery    . SPINE SURGERY     HPI:  Pt is a 79 y.o. Caucasian male with a known history of rheumatoid arthritis on methotrexate, type 2 diabetes mellitus, hypertension, PAD, GERD and seizure, who presented to the emergency room with acute onset of generalized weakness and low level of energy with associated body aches that started over the weekend and got worse since Monday.  He has been having fever and cold chills today.  He admits to dry cough over the last 2 to 3 days without dyspnea or wheezing.  His wife stated that he has been having altered mental status.  CT imaging this admission: "No airspace disease, effusion, or pneumothorax".  NSG notes reported s/s of oral phase dysphagia w/ po's post admission.    Assessment / Plan / Recommendation Clinical Impression  Pt appears to present w/ oral phase swallowing deficits most impacted by oral discomfort w/ increased texture and cold temperature. Pt verbally indicated discomfort w/ the  cold temps. of the liquids and puree; min discomfort attempting to masticate/mash the increased texture of minced solids, moistened well. During oral exam, pt presented w/ min coating on tongue - suspect possible Thrush. No unilateral weakness noted; all lingual/labial movements and strength WFL. W/ trials of thin liquids via cup/straw and purees, pt exhibited grossly adequate oral phase management, timely A-P transfer for swallow, and adequate oral clearing b/t trials. Increased effort and time noted w/ the increased textured trials. No overt clinical s/s of aspiration noted w/ trials given; no decline in o2 sats(98%), vocal quality, or respiratory presentation. Pt required full feeding assistance w/ po's d/t "pain in my hands"; pt has RA. NSG made aware. Recommend a dysphagia level 1 (puree) diet w/ thin liquids; general aspiration precautions; MD to assess for Thrush and tx w/ oral rinse. Ritta Slot can impact a pt's comfort with, and desire for, oral intake. Recommend Pills Crushed in puree for safer swallowing. Feeding support at all meals. ST services will f/u w/ family for Dentures to be brought in for trials to upgrade diet/foods if appropriate. NSG updated.  SLP Visit Diagnosis: Dysphagia, oral phase (R13.11)(possible Thrush)    Aspiration Risk  Risk for inadequate nutrition/hydration    Diet Recommendation  Dysphagia level 1 (puree) w/ thin liquids; general aspiration precautions; feeding support at meals d/t RA in hands. Monitor potential Thrush.   Medication Administration: Crushed with puree(for safer swallowing)    Other  Recommendations Recommended Consults: (Dietician f/u for support) Oral Care Recommendations: Oral care BID;Oral care before and after PO;Staff/trained caregiver to provide oral care Other Recommendations: (n/a)   Follow up Recommendations (TBD)      Frequency and Duration min 2x/week  2 weeks       Prognosis Prognosis for Safe Diet Advancement: Fair(-Good) Barriers  to Reach Goals: Cognitive deficits;Time post onset;Severity of deficits      Swallow Study   General Date of Onset: 07/02/19 HPI: Pt is a 79 y.o. Caucasian male with a known history of rheumatoid arthritis on methotrexate, type 2 diabetes mellitus, hypertension, PAD, GERD and seizure, who presented to the emergency room with acute onset of generalized weakness and low level of energy with associated body aches that started over the weekend and got worse since Monday.  He has been having fever and cold chills today.  He admits to dry cough over the last 2 to 3 days without dyspnea or wheezing.  His wife stated that he has been having altered mental status.  CT imaging this admission: "No airspace disease, effusion, or pneumothorax".  NSG notes reported s/s of oral phase dysphagia w/ po's post admission.  Type of Study: Bedside Swallow Evaluation Previous Swallow Assessment: none noted Diet Prior to this Study: Regular;Thin liquids Temperature Spikes Noted: Yes(wbc 11.6) Respiratory Status: Room air History of Recent Intubation: No Behavior/Cognition: Alert;Cooperative;Pleasant mood;Confused;Distractible;Requires cueing Oral Cavity Assessment: Dry;Dried secretions(min coating on tongue - suspect possible Thrush) Oral Care Completed by SLP: Yes Oral Cavity - Dentition: Missing dentition;Edentulous Vision: (n/a) Self-Feeding Abilities: Total assist(RA of hands) Patient Positioning: Upright in bed(needed full positioning) Baseline Vocal Quality: Normal Volitional Cough: Strong(cues) Volitional Swallow: Able to elicit(cues)    Oral/Motor/Sensory Function Overall Oral Motor/Sensory Function: Within functional limits(limited; bolus management wfl)   Ice Chips Ice chips: Within functional limits Presentation: Spoon(fed; 3 trials) Other Comments: c/o cold   Thin Liquid Thin Liquid: Within functional limits Presentation: Cup;Straw(supported/fed; ~3ozs total) Other Comments: water, ensure - c/o  cold    Nectar Thick Nectar Thick Liquid: Not tested   Honey Thick Honey Thick Liquid: Not tested   Puree Puree: Within functional limits Presentation: Spoon(fed; 10 trials) Other Comments: c/o cold   Solid     Solid: Impaired(minced foods/moistened) Presentation: Spoon(3 trials) Oral Phase Impairments: Impaired mastication(discomfort) Oral Phase Functional Implications: Prolonged oral transit;Impaired mastication(discomfort) Pharyngeal Phase Impairments: (none) Other Comments: discomfort       Orinda Kenner, MS, CCC-SLP Laekyn Rayos 07/06/2019,6:11 PM

## 2019-07-06 NOTE — Progress Notes (Signed)
PT Cancellation Note  Patient Details Name: Jeremiah [name]. MRN: [id number] DOB: 1941/02/17   Cancelled Treatment:    Reason Eval/Treat Not Completed: Medical issues which prohibited therapy(Per chart review, patient noted with transfer to CCU; continue upon transfer order received.  Though will hold patient at this time to ensure medical stability after transfer.  Will continue to follow and resume as medically appropriate and available.)   Balin Vandegrift H. Owens Shark, PT, DPT, NCS 07/06/19, 10:44 AM [phone]

## 2019-07-07 LAB — URINE CULTURE

## 2019-07-07 LAB — CBC WITH DIFFERENTIAL/PLATELET
Abs Immature Granulocytes: 0.06 10*3/uL (ref 0.00–0.07)
Abs Immature Granulocytes: 0.04 10*3/uL (ref 0.00–0.07)
Basophils Absolute: 0.1 10*3/uL (ref 0.0–0.1)
Basophils Absolute: 0 10*3/uL (ref 0.0–0.1)
Basophils Relative: 1 %
Basophils Relative: 0 %
Eosinophils Absolute: 0 10*3/uL (ref 0.0–0.5)
Eosinophils Absolute: 0 10*3/uL (ref 0.0–0.5)
Eosinophils Relative: 0 %
Eosinophils Relative: 0 %
HCT: 36.5 % — ABNORMAL LOW (ref 39.0–52.0)
HCT: 34.5 % — ABNORMAL LOW (ref 39.0–52.0)
Hemoglobin: 11.8 g/dL — ABNORMAL LOW (ref 13.0–17.0)
Hemoglobin: 11.1 g/dL — ABNORMAL LOW (ref 13.0–17.0)
Immature Granulocytes: 1 %
Immature Granulocytes: 0 %
Lymphocytes Relative: 8 %
Lymphocytes Relative: 10 %
Lymphs Abs: 1.1 10*3/uL (ref 0.7–4.0)
Lymphs Abs: 1.1 10*3/uL (ref 0.7–4.0)
MCH: 27.4 pg (ref 26.0–34.0)
MCH: 27.1 pg (ref 26.0–34.0)
MCHC: 32.3 g/dL (ref 30.0–36.0)
MCHC: 32.2 g/dL (ref 30.0–36.0)
MCV: 84.9 fL (ref 80.0–100.0)
MCV: 84.1 fL (ref 80.0–100.0)
Monocytes Absolute: 2 10*3/uL — ABNORMAL HIGH (ref 0.1–1.0)
Monocytes Absolute: 1.8 10*3/uL — ABNORMAL HIGH (ref 0.1–1.0)
Monocytes Relative: 15 %
Monocytes Relative: 16 %
Neutro Abs: 10 10*3/uL — ABNORMAL HIGH (ref 1.7–7.7)
Neutro Abs: 8.2 10*3/uL — ABNORMAL HIGH (ref 1.7–7.7)
Neutrophils Relative %: 75 %
Neutrophils Relative %: 74 %
Platelets: 281 10*3/uL (ref 150–400)
Platelets: 289 10*3/uL (ref 150–400)
RBC: 4.3 MIL/uL (ref 4.22–5.81)
RBC: 4.1 MIL/uL — ABNORMAL LOW (ref 4.22–5.81)
RDW: 15.3 % (ref 11.5–15.5)
RDW: 15.3 % (ref 11.5–15.5)
WBC: 13.2 10*3/uL — ABNORMAL HIGH (ref 4.0–10.5)
WBC: 11.1 10*3/uL — ABNORMAL HIGH (ref 4.0–10.5)
nRBC: 0 % (ref 0.0–0.2)
nRBC: 0 % (ref 0.0–0.2)

## 2019-07-07 LAB — COMPREHENSIVE METABOLIC PANEL
ALT: 10 U/L (ref 0–44)
AST: 15 U/L (ref 15–41)
Albumin: 2.4 g/dL — ABNORMAL LOW (ref 3.5–5.0)
Alkaline Phosphatase: 96 U/L (ref 38–126)
Anion gap: 13 (ref 5–15)
BUN: 21 mg/dL (ref 8–23)
CO2: 23 mmol/L (ref 22–32)
Calcium: 8.4 mg/dL — ABNORMAL LOW (ref 8.9–10.3)
Chloride: 105 mmol/L (ref 98–111)
Creatinine, Ser: 0.95 mg/dL (ref 0.61–1.24)
GFR calc Af Amer: >60 mL/min (ref >60)
GFR calc non Af Amer: >60 mL/min (ref >60)
Glucose, Bld: 89 mg/dL (ref 70–99)
Potassium: 3.3 mmol/L — ABNORMAL LOW (ref 3.5–5.1)
Sodium: 141 mmol/L (ref 135–145)
Total Bilirubin: 1 mg/dL (ref 0.3–1.2)
Total Protein: 6.4 g/dL — ABNORMAL LOW (ref 6.5–8.1)

## 2019-07-07 LAB — BASIC METABOLIC PANEL
Anion gap: 7 (ref 5–15)
BUN: 26 mg/dL — ABNORMAL HIGH (ref 8–23)
CO2: 24 mmol/L (ref 22–32)
Calcium: 8.1 mg/dL — ABNORMAL LOW (ref 8.9–10.3)
Chloride: 111 mmol/L (ref 98–111)
Creatinine, Ser: 0.93 mg/dL (ref 0.61–1.24)
GFR calc Af Amer: >60 mL/min (ref >60)
GFR calc non Af Amer: >60 mL/min (ref >60)
Glucose, Bld: 152 mg/dL — ABNORMAL HIGH (ref 70–99)
Potassium: 3.2 mmol/L — ABNORMAL LOW (ref 3.5–5.1)
Sodium: 142 mmol/L (ref 135–145)

## 2019-07-07 LAB — GLUCOSE, CAPILLARY
Glucose-Capillary: 84 mg/dL (ref 70–99)
Glucose-Capillary: 101 mg/dL — ABNORMAL HIGH (ref 70–99)
Glucose-Capillary: 121 mg/dL — ABNORMAL HIGH (ref 70–99)
Glucose-Capillary: 133 mg/dL — ABNORMAL HIGH (ref 70–99)

## 2019-07-07 LAB — LACTIC ACID, PLASMA: Lactic Acid, Venous: 2.1 mmol/L (ref 0.5–1.9)

## 2019-07-07 LAB — CULTURE, BLOOD (ROUTINE X 2)
Culture: NO GROWTH
Specimen Description: ADEQUATE

## 2019-07-07 LAB — FERRITIN: Ferritin: 210 ng/mL (ref 24–336)

## 2019-07-07 LAB — FIBRIN DERIVATIVES D-DIMER (ARMC ONLY): Fibrin derivatives D-dimer (ARMC): 2454.12 ng/mL (FEU) — ABNORMAL HIGH (ref 0.00–499.00)

## 2019-07-07 LAB — PROCALCITONIN: Procalcitonin: 1.31 ng/mL

## 2019-07-07 LAB — MAGNESIUM: Magnesium: 2.1 mg/dL (ref 1.7–2.4)

## 2019-07-07 LAB — C-REACTIVE PROTEIN: CRP: 29 mg/dL — ABNORMAL HIGH (ref <1.0)

## 2019-07-07 MED ORDER — LACTATED RINGERS IV BOLUS
500.0000 mL | Freq: Once | INTRAVENOUS | Status: AC
  Administered 2019-07-07: 22:00:00 500 mL via INTRAVENOUS

## 2019-07-07 MED ORDER — METOPROLOL TARTRATE 25 MG PO TABS
25.0000 mg | ORAL_TABLET | Freq: Two times a day (BID) | ORAL | Status: DC
  Administered 2019-07-07 – 2019-07-09 (×5): 25 mg
  Filled 2019-07-07 (×5): qty 1

## 2019-07-07 MED ORDER — METOPROLOL TARTRATE 5 MG/5ML IV SOLN
2.5000 mg | Freq: Four times a day (QID) | INTRAVENOUS | Status: DC | PRN
  Administered 2019-07-07: 5 mg via INTRAVENOUS
  Administered 2019-07-07: 19:00:00 2.5 mg via INTRAVENOUS
  Filled 2019-07-07 (×2): qty 5

## 2019-07-07 MED ORDER — ENSURE ENLIVE PO LIQD
237.0000 mL | Freq: Three times a day (TID) | ORAL | Status: DC
  Administered 2019-07-07 – 2019-07-09 (×6): 237 mL via ORAL

## 2019-07-07 MED ORDER — POTASSIUM CHLORIDE CRYS ER 20 MEQ PO TBCR
40.0000 meq | EXTENDED_RELEASE_TABLET | Freq: Once | ORAL | Status: AC
  Administered 2019-07-07: 09:00:00 40 meq via ORAL

## 2019-07-07 NOTE — Progress Notes (Signed)
MEWS is a 5, high risk for deteriation Temp 100.9, HR 110-120 after PRN metoprolol (was 130s at beginning of shift), SBP 80-90s.Barbaraann Share, NP & E. Stark Klein, NP to notify.

## 2019-07-07 NOTE — TOC Progression Note (Signed)
Transition of Care (TOC) - Progression Note    Patient Details  Name: Jeremiah [name]. MRN: [id number] Date of Birth: 06-01-1940  Transition [facility]) CM/SW Contact  Shelbie Hutching, RN Phone Number: 07/07/2019, 3:36 PM  Clinical Narrative:     RNCM reviewed skilled nursing rehab bed offers with patient's wife, Jeremiah [name].  Jeremiah [name] reports that she will lookup the facilities tonight and let RNCM know her decision tomorrow morning.    Expected Discharge Plan: [place] Barriers to Discharge: Continued Medical Work up  Expected Discharge Plan and Services Expected Discharge Plan: [place]   Discharge Planning Services: CM Consult Post Acute Care Choice: [place] Living arrangements for the past 2 months: Single Family Home                                       Social Determinants of Health (SDOH) Interventions    Readmission Risk Interventions No flowsheet data found.

## 2019-07-07 NOTE — Progress Notes (Signed)
Telemetry called and stated that patients HR was 130. Gave patient PRN Metoprolol 2.5mg  IV.

## 2019-07-07 NOTE — Progress Notes (Signed)
Initial Nutrition Assessment   DOCUMENTATION CODES:   Not applicable  INTERVENTION:  Provide Ensure Enlive po TID between meals, each supplement provides 350 kcal and 20 grams of protein.  Provide Magic cup TID with meals, each supplement provides 290 kcal and 9 grams of protein.  Provide Hormel Shake po once daily with dinner, each supplement provides 520 kcal and 22 grams of protein.  Measure weekly weights to monitor for weight loss in setting of catabolic nature of [id number].  NUTRITION DIAGNOSIS:   Increased nutrient needs related to catabolic [id number] as evidenced by estimated needs.  GOAL:   Patient will meet greater than or equal to 90% of their needs  MONITOR:   PO intake, Supplement acceptance, Labs, Weight trends, Skin, Diet advancement, I & O's  REASON FOR ASSESSMENT:   Consult Poor PO  ASSESSMENT:   79 year old male with PMHx of dementia, HTN, rheumatoid arthritis on methotrexate, seizures, type 2 DM, PAD, GERD admitted with COVID-19, septic shock, acute encephalopathy.   Patient is on airborne/contact precautions in setting of COVID-19. He is unable to provide any history over the phone at this time. Following SLP evaluation on 2/15 diet was downgraded to dysphagia 1 with thin liquids. Patient has poor PO intake. According to chart he has been eating bites/sips-30% of his meals except for two meals where he ate 50-60%. He only ate bites of his breakfast this morning.  Noted patient has a hx of a PEG tube placement on 04/24/2019. Spoke with patient's son Corene Cornea over the phone. He reports that the tube was just removed about 3 weeks ago. When patient got home from the hospital after his last admission his mentation improved significantly. His appetite and intake also returned to baseline and he was able to eat well. Therefore PEG was removed as patient was not requiring tube feeds any longer.  According to weight history in chart patient was 117.8 kg on  06/27/2018 and 104.3 kg on 04/24/2019. He lost 13.5 kg (11.5% body weight) over 10 months, which is not significant for time frame. No new weight was taken this admission and it appears the previous weight from 2 months ago was copied forward. Unable to tell patient's weight trend over the past 2 months.  Medications reviewed and include: vitamin C 500 mg daily, vitamin D3 1000 units daily, famotidine, Pro-Stat 30 mL BID per tube (leftover from old TF orders), folic acid 1 mg daily, Novolog 0-9 units TID, magnesium oxide 400 mg daily, pantoprazole, Miralax, thiamine 100 mg daily, zinc sulfate 220 mg daily, meropenem, vancomycin.  Labs reviewed: CBG 75-86, Potassium 3.3.  Unable to determine if patient meets criteria for malnutrition at this time.  NUTRITION - FOCUSED PHYSICAL EXAM:  Unable to complete at this time as patient is on airborne/contact precautions in setting of COVID-19.  Diet Order:   Diet Order            DIET - DYS 1 Room service appropriate? Yes with Assist; Fluid consistency: Thin  Diet effective now             EDUCATION NEEDS:   No education needs have been identified at this time  Skin:  Skin Assessment: Skin Integrity Issues:(scabbed over opening in abdomen from previous PEG site)  Last BM:  07/07/2019 medium type 7  Height:   Ht Readings from Last 1 Encounters:  07/02/19 6\' 1"  (1.854 m)   Weight:   Wt Readings from Last 1 Encounters:  07/02/19 104.3 kg  Ideal Body Weight:  83.6 kg  BMI:  Body mass index is 30.34 kg/m.  Estimated Nutritional Needs:   Kcal:  2200-2400  Protein:  115-130 grams  Fluid:  >/=2 L/day  Jacklynn Barnacle, MS, RD, LDN Pager number available on Amion

## 2019-07-07 NOTE — Progress Notes (Signed)
Patients heart rate has been in the 120s since he has transferred here from ICU. The report I got stated that he has been doing this and its nothing new. He did get his Metoprolol this morning.  Also everytime you touch the patient he screams out in pain. Literally just touching his hands, arms or anything. I spoke to the son and he said that is new for his Dad that he was not doing that before. Messaged Dr. Latina Craver about same. No new orders at this time. She said she increased his Metoprolol and he does have a PRN IV Metoprolol order.

## 2019-07-07 NOTE — Progress Notes (Signed)
PROGRESS NOTE    Jeremiah [name].  [id number] DOB: 05/08/1941 DOA: 07/02/2019 PCP: Derinda Late, MD   Brief Narrative:  [name] a57 y.o.Caucasian malewith a known history of rheumatoid arthritis on methotrexate, type 2 diabetes mellitus, hypertension, PAD, GERD and seizure, who presented to the emergency room with acute onset ofgeneralized weakness and low level of energy with associated body aches that started over the weekend and got worse since Monday. He has been having fever and cold chills today. He admits to dry cough over the last 2 to 3 days without dyspnea or wheezing. His wife stated that he has been having altered mental status. No chest pain or palpitations. No nausea or vomiting or diarrhea. No dysuria, oliguria or hematuria or flank pain. He denied any loss of taste or smell.  Upon presenting to the emergency room, temperature was 102.1 with heart rate of 112 with otherwise normal vital signs.  PCR positive  Subjective: Patient was feeling better when seen this morning.  No new complaints.  Assessment & Plan:   Active Problems:   COVID-19 virus infection   Encephalopathy acute   Generalized weakness   Hypokalemia   Calf tenderness  COVID-19.Elevated inflammatory markers with worsening.  Saturating well on room air.  Urine and blood cultures are with no growth.  Started developing fever again after remaining afebrile for 2 days. -Completed course of remdesivir. -Continue steroid -Continue on vitamin D3, vitamin C, zinc sulfate, p.o. Pepcid - continue aspirin. -Monitor inflammatory markers.  Septic Shock.  Resolved Patient become febrile over the last 24-hour up to 102.3, become more lethargic.Rapid response was called last night due to decreased responsiveness, ABG with mild alkalosis and mild hypoxia, no CO2 retention.  Procalcitonin elevated.  MRSA swab negative.  CTA without PE or any significant lung abnormalities.  Developed mild  lactic acidosis which resolved with IV fluid. Patient was transferred to ICU due to persistent hypotension with very little response to IV fluid for pressors. His blood pressure improved overnight, does not required any pressor. Inflammatory markers and procalcitonin continue to get worse.  No obvious source of infection.  Repeat blood culture remain negative.  Repeat urine culture with multiple organisms advising the collection, initial urine culture was negative. - vancomycin was added yesterday for empirical coverage although MRSA swab was negative, procalcitonin continue to get worse. -Patient was started on meropenem on 07/05/19 for empiric coverage due to his allergies with penicillin and cephalosporin. -Can be transferred back to 1C -Continue to monitor. -Patient is high risk for deterioration.   Acute encephalopathy.  Likely metabolic from infection plus underlying dementia.  Per son , pt also has underlying dementia.  Mental status seems little improving today. - LE venous doppler was negative for DVT -CTA to r/o PE-negative for PE -PCT elevated with worsening today. - PT Evaluation-Recommending SNF placement. -Social worker consult to find a bed.  3. Rheumatoid arthritis. -continue chloroquine. He was taken off Methotrexate per son. Last dose was sometime this week.  4. Type II diabetes mellitus, well controlled.  CBG within goal. -Continue supplemental coverage with NovoLog as needed. -Last hemoglobin A1c was 5.7 and August 2020.  5. Seizure disorder. -Continue Lamictal.  6. Hypertension. -Holding Hyzaar and Lopressor due to hypotension-can be restarted once blood pressure improves.  7.Hypokalemia.  Potassium at 3.3. - magnesium-2.2 Replace K Monitor level  Objective: Vitals:   07/07/19 0600 07/07/19 0700 07/07/19 0800 07/07/19 0900  BP: (!) 144/81 (!) 142/95 131/72 128/69  Pulse: (!) 126 Marland Kitchen)  121 (!) 126 (!) 126  Resp: 14 16 14 16   Temp:   98.5 F (36.9  C)   TempSrc:   Oral   SpO2: 100% 99% 97% 97%  Weight:      Height:        Intake/Output Summary (Last 24 hours) at 07/07/2019 1308 Last data filed at 07/07/2019 0800 Gross per 24 hour  Intake 881.51 ml  Output 605 ml  Net 276.51 ml   Filed Weights   07/02/19 1613  Weight: 104.3 kg    Examination:  General exam: Chronically ill-appearing gentleman, in no acute distress. Respiratory system: Clear to auscultation. Respiratory effort normal. Cardiovascular system: S1 & S2 heard, RRR. No JVD, murmurs, rubs, gallops or clicks. Gastrointestinal system: Soft, nontender, nondistended, bowel sounds positive. Central nervous system: Alert and oriented to self , Able to follow commands. No focal neurological deficits. Extremities: 1+ edema, no cyanosis, pulses intact and symmetrical. Psychiatry: Judgement and insight appear impaired .    DVT prophylaxis: Lovenox Code Status: Full Family Communication: Son was updated on phone  Disposition Plan: Pending improvement, high risk for deterioration.  Consultants:   PCCM  Procedures:  Antimicrobials:  Meropenem Vancomycin  Data Reviewed: I have personally reviewed following labs and imaging studies  CBC: Recent Labs  Lab 07/03/19 0742 07/04/19 0548 07/05/19 0552 07/06/19 0349 07/07/19 0421  WBC 6.8 7.1 6.9 11.6* 13.2*  NEUTROABS 5.2 5.0 4.5 8.7* 10.0*  HGB 11.0* 10.4* 11.3* 11.9* 11.8*  HCT 35.0* 32.9* 34.8* 35.9* 36.5*  MCV 86.8 85.2 84.1 83.9 84.9  PLT 219 220 228 269 [id number]   Basic Metabolic Panel: Recent Labs  Lab 07/03/19 0742 07/04/19 0548 07/05/19 0552 07/06/19 0349 07/07/19 0421  NA 139 136 137 135 141  K 3.1* 3.1* 3.3* 3.1* 3.3*  CL 105 103 103 100 105  CO2 27 25 26 25 23   GLUCOSE 112* 103* 99 98 89  BUN 19 17 18 19 21   CREATININE 0.88 0.79 0.86 0.90 0.95  CALCIUM 8.7* 8.5* 8.7* 8.3* 8.4*  MG  --  1.9 2.0  --  2.1   GFR: Estimated Creatinine Clearance: 82.6 mL/min (by C-G formula based on SCr of 0.95  mg/dL). Liver Function Tests: Recent Labs  Lab 07/03/19 0742 07/04/19 0548 07/05/19 0552 07/06/19 0349 07/07/19 0421  AST 13* 11* 11* 13* 15  ALT 10 8 9 9 10   ALKPHOS 102 86 92 92 96  BILITOT 0.6 1.0 0.8 0.8 1.0  PROT 7.0 6.4* 6.7 6.8 6.4*  ALBUMIN 2.9* 2.6* 2.6* 2.5* 2.4*   Recent Labs  Lab 07/02/19 1627  LIPASE 25   No results for input(s): AMMONIA in the last 168 hours. Coagulation Profile: Recent Labs  Lab 07/02/19 1627  INR 1.1   Cardiac Enzymes: No results for input(s): CKTOTAL, CKMB, CKMBINDEX, TROPONINI in the last 168 hours. BNP (last 3 results) No results for input(s): PROBNP in the last 8760 hours. HbA1C: No results for input(s): HGBA1C in the last 72 hours. CBG: Recent Labs  Lab 07/06/19 1532 07/06/19 1625 07/06/19 1633 07/06/19 2017 07/07/19 1247  GLUCAP 80 88 75 86 101*   Lipid Profile: No results for input(s): CHOL, HDL, LDLCALC, TRIG, CHOLHDL, LDLDIRECT in the last 72 hours. Thyroid Function Tests: No results for input(s): TSH, T4TOTAL, FREET4, T3FREE, THYROIDAB in the last 72 hours. Anemia Panel: Recent Labs    07/06/19 0349 07/07/19 0421  FERRITIN 181 210   Sepsis Labs: Recent Labs  Lab 07/02/19 1627 07/05/19 0552 07/05/19 1929  07/05/19 2212 07/06/19 0349 07/07/19 0421  PROCALCITON 0.14 0.36  --   --  0.83 1.31  LATICACIDVEN 1.1  --  2.1* 1.3  --   --     Recent Results (from the past 240 hour(s))  Blood Culture (routine x 2)     Status: None   Collection Time: 07/02/19  4:27 PM   Specimen: BLOOD  Result Value Ref Range Status   Specimen Description BLOOD Blood Culture adequate volume  Final   Special Requests   Final    BOTTLES DRAWN AEROBIC AND ANAEROBIC LEFT ANTECUBITAL   Culture   Final    NO GROWTH 5 DAYS Performed at [facility], [address], [place], [place] [address]    Report Status 07/07/2019 FINAL  Final  Urine culture     Status: None   Collection Time: 07/02/19  5:16 PM   Specimen: Urine,  Random  Result Value Ref Range Status   Specimen Description   Final    URINE, RANDOM Performed at [facility], [address], [place], [place] [address]    Special Requests   Final    NONE Performed at [facility], [address], [place], [place] [address]    Culture   Final    NO GROWTH Performed at [facility], [place] [address] [place], [place] [address]    Report Status 07/04/2019 FINAL  Final  Respiratory Panel by RT PCR (Flu A&B, Covid) - Nasopharyngeal Swab     Status: Abnormal   Collection Time: 07/02/19  6:35 PM   Specimen: Nasopharyngeal Swab  Result Value Ref Range Status   SARS Coronavirus 2 by RT PCR POSITIVE (A) NEGATIVE Final    Comment: RESULT CALLED TO, READ BACK BY AND VERIFIED WITH: ANDREA BRYANT 07/02/19 1940 KLW (NOTE) SARS-CoV-2 target nucleic acids are DETECTED. SARS-CoV-2 RNA is generally detectable in upper respiratory specimens  during the acute phase of infection. Positive results are indicative of the presence of the identified virus, but do not rule out bacterial infection or co-infection with other pathogens not detected by the test. Clinical correlation with patient history and other diagnostic information is necessary to determine patient infection status. The expected result is Negative. Fact Sheet for Patients:  PinkCheek.be Fact Sheet for Healthcare Providers: GravelBags.it This test is not yet approved or cleared by the [place] FDA and  has been authorized for detection and/or diagnosis of SARS-CoV-2 by FDA under an Emergency Use Authorization (EUA).  This EUA will remain in effect (meaning this test can be used) for th e duration of  the COVID-19 declaration under Section 564(b)(1) of the Act, 21 U.S.C. section 360bbb-3(b)(1), unless the authorization is terminated or revoked sooner.    Influenza A by PCR NEGATIVE NEGATIVE Final   Influenza B by  PCR NEGATIVE NEGATIVE Final    Comment: (NOTE) The Xpert Xpress SARS-CoV-2/FLU/RSV assay is intended as an aid in  the diagnosis of influenza from Nasopharyngeal swab specimens and  should not be used as a sole basis for treatment. Nasal washings and  aspirates are unacceptable for Xpert Xpress SARS-CoV-2/FLU/RSV  testing. Fact Sheet for Patients: PinkCheek.be Fact Sheet for Healthcare Providers: GravelBags.it This test is not yet approved or cleared by the [place] FDA and  has been authorized for detection and/or diagnosis of SARS-CoV-2 by  FDA under an Emergency Use Authorization (EUA). This EUA will remain  in effect (meaning this test can be used) for the duration of the  Covid-19 declaration under Section 564(b)(1) of the Act, 21  U.S.C. section 360bbb-3(b)(1), unless the authorization is  terminated or revoked. Performed at [facility], [place] [place], [place] [address]   CULTURE, BLOOD (ROUTINE X 2) w Reflex to ID Panel     Status: None (Preliminary result)   Collection Time: 07/05/19  7:42 PM   Specimen: BLOOD  Result Value Ref Range Status   Specimen Description BLOOD LEFT ANTECUBITAL  Final   Special Requests   Final    BOTTLES DRAWN AEROBIC ONLY Blood Culture adequate volume   Culture   Final    NO GROWTH 2 DAYS Performed at [facility], [address], [place], [place] [address]    Report Status PENDING  Incomplete  CULTURE, BLOOD (ROUTINE X 2) w Reflex to ID Panel     Status: None (Preliminary result)   Collection Time: 07/05/19  7:45 PM   Specimen: BLOOD  Result Value Ref Range Status   Specimen Description BLOOD LEFT HAND  Final   Special Requests   Final    BOTTLES DRAWN AEROBIC ONLY Blood Culture adequate volume   Culture   Final    NO GROWTH 2 DAYS Performed at [facility], [address], [place], [place] [address]    Report Status PENDING  Incomplete   MRSA PCR Screening     Status: None   Collection Time: 07/05/19  7:55 PM   Specimen: Nasal Mucosa; Nasopharyngeal  Result Value Ref Range Status   MRSA by PCR NEGATIVE NEGATIVE Final    Comment:        The GeneXpert MRSA Assay (FDA approved for NASAL specimens only), is one component of a comprehensive MRSA colonization surveillance program. It is not intended to diagnose MRSA infection nor to guide or monitor treatment for MRSA infections. Performed at [facility], [address], [place], [place] [address]   Urine Culture     Status: Abnormal   Collection Time: 07/06/19  8:22 AM   Specimen: Urine, Random  Result Value Ref Range Status   Specimen Description   Final    URINE, RANDOM Performed at [facility], [address] [place], [place] [address]    Special Requests   Final    NONE Performed at [facility], [place] [place],  [address]    Culture MULTIPLE SPECIES PRESENT, SUGGEST RECOLLECTION (A)  Final   Report Status 07/07/2019 FINAL  Final     Radiology Studies: CT ANGIO CHEST PE W OR WO CONTRAST  Result Date: 07/05/2019 CLINICAL DATA:  Shortness of breath, COVID-19 positive, worsening mental status EXAM: CT ANGIOGRAPHY CHEST WITH CONTRAST TECHNIQUE: Multidetector CT imaging of the chest was performed using the standard protocol during bolus administration of intravenous contrast. Multiplanar CT image reconstructions and MIPs were obtained to evaluate the vascular anatomy. CONTRAST:  55mL OMNIPAQUE IOHEXOL 350 MG/ML SOLN COMPARISON:  07/02/2019 FINDINGS: Cardiovascular: This is a technically adequate evaluation of the pulmonary vasculature. No filling defects or pulmonary emboli. The heart is unremarkable without pericardial effusion. Moderate atherosclerosis of the coronary vessels greatest in the LAD distribution. Normal caliber of the thoracic aorta without dissection. Mediastinum/Nodes: No enlarged mediastinal, hilar, or  axillary lymph nodes. Thyroid gland, trachea, and esophagus demonstrate no significant findings. Lungs/Pleura: No acute airspace disease, effusion, or pneumothorax. The central airways are patent. Upper Abdomen: There is a large amount of retained stool within the visible colon. Upper abdomen is otherwise unremarkable. Musculoskeletal: No acute  or destructive bony lesions. Reconstructed images demonstrate no additional findings. Review of the MIP images confirms the above findings. IMPRESSION: 1. No CT evidence of pulmonary embolism. 2. No acute intrathoracic findings. 3. Large amount of retained stool within the visible colon. Electronically Signed   By: Randa Ngo M.D.   On: 07/05/2019 15:36   US Venous Img Lower Bilateral (DVT)  Result Date: 07/05/2019 CLINICAL DATA:  Calf tenderness and lower extremity edema. EXAM: BILATERAL LOWER EXTREMITY VENOUS DOPPLER ULTRASOUND TECHNIQUE: Gray-scale sonography with graded compression, as well as color Doppler and duplex ultrasound were performed to evaluate the lower extremity deep venous systems from the level of the common femoral vein and including the common femoral, femoral, profunda femoral, popliteal and calf veins including the posterior tibial, peroneal and gastrocnemius veins when visible. The superficial great saphenous vein was also interrogated. Spectral Doppler was utilized to evaluate flow at rest and with distal augmentation maneuvers in the common femoral, femoral and popliteal veins. COMPARISON:  None. FINDINGS: RIGHT LOWER EXTREMITY Common Femoral Vein: No evidence of thrombus. Normal compressibility, respiratory phasicity and response to augmentation. Saphenofemoral Junction: No evidence of thrombus. Normal compressibility and flow on color Doppler imaging. Profunda Femoral Vein: No evidence of thrombus. Normal compressibility and flow on color Doppler imaging. Femoral Vein: No evidence of thrombus. Normal compressibility, respiratory phasicity  and response to augmentation. Popliteal Vein: No evidence of thrombus. Normal compressibility, respiratory phasicity and response to augmentation. Calf Veins: No evidence of thrombus. Normal compressibility and flow on color Doppler imaging. Superficial Great Saphenous Vein: No evidence of thrombus. Normal compressibility. Venous Reflux:  None. Other Findings: No evidence of superficial thrombophlebitis or abnormal fluid collection. LEFT LOWER EXTREMITY Common Femoral Vein: No evidence of thrombus. Normal compressibility, respiratory phasicity and response to augmentation. Saphenofemoral Junction: No evidence of thrombus. Normal compressibility and flow on color Doppler imaging. Profunda Femoral Vein: No evidence of thrombus. Normal compressibility and flow on color Doppler imaging. Femoral Vein: No evidence of thrombus. Normal compressibility, respiratory phasicity and response to augmentation. Popliteal Vein: No evidence of thrombus. Normal compressibility, respiratory phasicity and response to augmentation. Calf Veins: No evidence of thrombus. Normal compressibility and flow on color Doppler imaging. Superficial Great Saphenous Vein: No evidence of thrombus. Normal compressibility. Venous Reflux:  None. Other Findings: No evidence of superficial thrombophlebitis or abnormal fluid collection. IMPRESSION: No evidence of deep venous thrombosis in either lower extremity. Electronically Signed   By: Aletta Edouard M.D.   On: 07/05/2019 14:20    Scheduled Meds: . vitamin C  500 mg Oral Daily  . aspirin  81 mg Oral Daily  . Chlorhexidine Gluconate Cloth  6 each Topical Daily  . cholecalciferol  1,000 Units Oral Daily  . enoxaparin (LOVENOX) injection  40 mg Subcutaneous Q24H  . famotidine  20 mg Oral BID  . feeding supplement (ENSURE ENLIVE)  237 mL Oral TID BM  . folic acid  1 mg Oral Daily  . losartan  50 mg Oral Daily   And  . hydrochlorothiazide  12.5 mg Oral Daily  . hydroxychloroquine  200 mg Oral  Daily  . insulin aspart  0-9 Units Subcutaneous TID AC & HS  . lamoTRIgine  200 mg Oral BID  . lamoTRIgine  25 mg Oral BID  . magnesium oxide  400 mg Oral Daily  . mouth rinse  15 mL Mouth Rinse BID  . metoprolol tartrate  12.5 mg Per Tube BID  . nystatin  5 mL Oral QID  . pantoprazole  40 mg Oral Daily  . polyethylene glycol  17 g Oral Daily  . thiamine  100 mg Oral Daily  . traZODone  50 mg Oral QHS  . zinc sulfate  220 mg Oral Daily   Continuous Infusions: . sodium chloride Stopped (07/06/19 0758)  . sodium chloride Stopped (07/06/19 1123)  . meropenem (MERREM) IV 200 mL/hr at 07/07/19 0600  . vancomycin Stopped (07/07/19 0126)     LOS: 5 days   Time spent: 40 minutes.  Lorella Nimrod, MD [facility]  If 7PM-7AM, please contact night-coverage [email or web address]  07/07/2019, 1:08 PM   This record has been created using Systems analyst. Errors have been sought and corrected,but may not always be located. Such creation errors do not reflect on the standard of care.

## 2019-07-08 ENCOUNTER — Inpatient Hospital Stay: Payer: Medicare PPO

## 2019-07-08 DIAGNOSIS — Z79899 Other long term (current) drug therapy: Secondary | ICD-10-CM

## 2019-07-08 DIAGNOSIS — G9349 Other encephalopathy: Secondary | ICD-10-CM

## 2019-07-08 DIAGNOSIS — Z7982 Long term (current) use of aspirin: Secondary | ICD-10-CM

## 2019-07-08 DIAGNOSIS — G9341 Metabolic encephalopathy: Secondary | ICD-10-CM

## 2019-07-08 DIAGNOSIS — D72820 Lymphocytosis (symptomatic): Secondary | ICD-10-CM

## 2019-07-08 DIAGNOSIS — R509 Fever, unspecified: Secondary | ICD-10-CM

## 2019-07-08 DIAGNOSIS — Z8616 Personal history of COVID-19: Secondary | ICD-10-CM

## 2019-07-08 DIAGNOSIS — Z881 Allergy status to other antibiotic agents status: Secondary | ICD-10-CM

## 2019-07-08 DIAGNOSIS — Z880 Allergy status to penicillin: Secondary | ICD-10-CM

## 2019-07-08 DIAGNOSIS — G40909 Epilepsy, unspecified, not intractable, without status epilepticus: Secondary | ICD-10-CM

## 2019-07-08 DIAGNOSIS — D61818 Other pancytopenia: Secondary | ICD-10-CM

## 2019-07-08 DIAGNOSIS — Z87891 Personal history of nicotine dependence: Secondary | ICD-10-CM

## 2019-07-08 DIAGNOSIS — Z931 Gastrostomy status: Secondary | ICD-10-CM

## 2019-07-08 DIAGNOSIS — Z91040 Latex allergy status: Secondary | ICD-10-CM

## 2019-07-08 DIAGNOSIS — U071 COVID-19: Secondary | ICD-10-CM

## 2019-07-08 DIAGNOSIS — I10 Essential (primary) hypertension: Secondary | ICD-10-CM

## 2019-07-08 DIAGNOSIS — M069 Rheumatoid arthritis, unspecified: Secondary | ICD-10-CM

## 2019-07-08 DIAGNOSIS — D469 Myelodysplastic syndrome, unspecified: Secondary | ICD-10-CM

## 2019-07-08 DIAGNOSIS — R531 Weakness: Secondary | ICD-10-CM

## 2019-07-08 LAB — GLUCOSE, CAPILLARY
Glucose-Capillary: 104 mg/dL — ABNORMAL HIGH (ref 70–99)
Glucose-Capillary: 114 mg/dL — ABNORMAL HIGH (ref 70–99)
Glucose-Capillary: 124 mg/dL — ABNORMAL HIGH (ref 70–99)
Glucose-Capillary: 130 mg/dL — ABNORMAL HIGH (ref 70–99)
Glucose-Capillary: 132 mg/dL — ABNORMAL HIGH (ref 70–99)
Glucose-Capillary: 163 mg/dL — ABNORMAL HIGH (ref 70–99)
Glucose-Capillary: 191 mg/dL — ABNORMAL HIGH (ref 70–99)

## 2019-07-08 LAB — FOLATE: Folate: 11.4 ng/mL (ref >5.9)

## 2019-07-08 LAB — LACTIC ACID, PLASMA: Lactic Acid, Venous: 1.2 mmol/L (ref 0.5–1.9)

## 2019-07-08 MED ORDER — FOLIC ACID 5 MG/ML IJ SOLN
1.0000 mg | Freq: Every day | INTRAMUSCULAR | Status: DC
  Administered 2019-07-08 – 2019-07-09 (×2): 1 mg via INTRAVENOUS
  Filled 2019-07-08 (×3): qty 0.2

## 2019-07-08 MED ORDER — SODIUM CHLORIDE 0.9 % IV BOLUS
500.0000 mL | Freq: Once | INTRAVENOUS | Status: AC
  Administered 2019-07-08: 03:00:00 500 mL via INTRAVENOUS

## 2019-07-08 MED ORDER — CIPROFLOXACIN HCL 0.3 % OP SOLN
1.0000 [drp] | OPHTHALMIC | Status: DC
  Administered 2019-07-08 – 2019-07-10 (×10): 1 [drp] via OPHTHALMIC
  Filled 2019-07-08: qty 2.5

## 2019-07-08 MED ORDER — METHYLPREDNISOLONE SODIUM SUCC 40 MG IJ SOLR
40.0000 mg | Freq: Every day | INTRAMUSCULAR | Status: DC
  Administered 2019-07-08 – 2019-07-09 (×2): 40 mg via INTRAVENOUS
  Filled 2019-07-08 (×2): qty 1

## 2019-07-08 MED ORDER — POTASSIUM CHLORIDE CRYS ER 20 MEQ PO TBCR
40.0000 meq | EXTENDED_RELEASE_TABLET | Freq: Once | ORAL | Status: AC
  Administered 2019-07-08: 40 meq via ORAL
  Filled 2019-07-08: qty 2

## 2019-07-08 NOTE — Progress Notes (Signed)
Speech Language Pathology Treatment: Dysphagia  Patient Details Name: Jeremiah [name]. MRN: [id number] DOB: [birth year] Today's Date: 07/08/2019 Time: [id number] SLP Time Calculation (min) (ACUTE ONLY): 45 min  Assessment / Plan / Recommendation Clinical Impression  Pt seen for ongoing assessment of toleration of diet; safety w/ oral intake. Pt worked w/ OT/ST during this session today. Pt awakened and alerted becoming engaged verbally once repositioned upright in bed; fully awakened. Pt continues to endorse pain to touch in UEs/Hands d/t RA. During oral exam, pt's lingula mucosa was much improved. MD treated pt for Thrush. Pt remains Edentulous; Dentures at home possibly per pt report. Pt required verbal cues to redirect attention to po tasks intermittently.  Pt consumed trials of thin liquids via Straw (often Pinched to monitor large boluses) w/ no immediate, overt clinical s/s of aspiration; noted a mild throat clear post multiple sip trials and pt talking to SLP. Educated and instructed pt on general aspiration precautions to include smaller sips, slowly and to talk less during eating/drinking. Practiced this w/ pt. Pt consumed several bites of Purees and ~6-7 ozs of thin liquids during Lunch meal. No decline in vocal quality or respiratory status noted during/post trials. Pt required full feeding support d/t weakness of UEs but he was able to hold Cup during sip trials.  Recommend continue current diet of purees (d/t Edentulous status and Cognitive decline) w/ thin liquids; general aspiration precautions; feeding support at meals w/ cues for less talking during eating/drinking; cues for attention to po tasks. Pills in Puree - Crushed for safer swallowing d/t Cognitive status, illness. ST services will continue to f/u w/ pt's status and give trials of increased textured foods once pt has his Dentures.     HPI HPI: Pt is a 79 y.o. Caucasian male with a known history of rheumatoid arthritis on  methotrexate, type 2 diabetes mellitus, hypertension, PAD, GERD and seizure, who presented to the emergency room with acute onset of generalized weakness and low level of energy with associated body aches that started over the weekend and got worse since Monday.  He has been having fever and cold chills today.  He admits to dry cough over the last 2 to 3 days without dyspnea or wheezing.  His wife stated that he has been having altered mental status.  CT imaging this admission: "No airspace disease, effusion, or pneumothorax".  NSG notes reported s/s of oral phase dysphagia w/ po's post admission.       SLP Plan  Continue with current plan of care(trials to upgrade food consistency when dentures in)       Recommendations  Diet recommendations: Dysphagia 1 (puree);Thin liquid Liquids provided via: Cup;Straw(monitor) Medication Administration: Crushed with puree(for safer swallowing) Supervision: Staff to assist with self feeding;Full supervision/cueing for compensatory strategies Compensations: Minimize environmental distractions;Slow rate;Small sips/bites;Lingual sweep for clearance of pocketing;Multiple dry swallows after each bite/sip;Follow solids with liquid Postural Changes and/or Swallow Maneuvers: Seated upright 90 degrees;Upright 30-60 min after meal                General recommendations: (Dietician f/u) Oral Care Recommendations: Oral care BID;Oral care before and after PO;Staff/trained caregiver to provide oral care Follow up Recommendations: (TBD) SLP Visit Diagnosis: Dysphagia, oral phase (R13.11) Plan: Continue with current plan of care(trials to upgrade food consistency when dentures in)       [name], [name], CCC-SLP Jeremiah [name] 07/08/2019, 3:35  PM

## 2019-07-08 NOTE — Progress Notes (Addendum)
Notified E. Ouma of Lactic Acid: 2.1, Potassium: 3.2, and updated on HR remaining 118-125. BP improved to 110-130s.

## 2019-07-08 NOTE — Progress Notes (Signed)
Occupational Therapy Treatment Patient Details Name: Jeremiah [name]. MRN: [id number] DOB: 1941-01-03 Today's Date: 07/08/2019    History of present illness  79 y.o. male with Past medical history of rheumatoid arthritis on immunosuppressive therapy, seizures, HTN, chronic lymphedema. Pt was here 2 months ago with LE edema/cellulitis, went to rehab and apparently had recoverd to near baseline (transfers, standing for self care with minimal family assist).  He now returns with weakness/decreased function and was found to be Covid +   OT comments  Mr. Wagenblast was seen for OT/SLP co-treatment on this date. Upon arrival to room pt semi-supine in bed with SLP already in room to begin session. OT provides max/total assist to position pt BUE and trunk with support of pillows and towel rolls to maximize functional positioning for self-feeding this date. OT engages pt in BUE there-ex as described below. Pt noted with trace palpable muscle activation during there-ex and requires PROM for all range of both hands, wrists, and arms this date. OT provides max assist for positioning cup in pt hand and max assist to bring cup with straw to mouth x3 trials. As pt progresses with cup use this date this Pryor Curia is able to fade physical support with pt able to take a drink independently at end of session. Pt progressing toward OT goals, and continues to benefit from skilled OT services to maximize return to PLOF and minimize risk of future falls, injury, caregiver burden, and readmission. Will continue to follow POC. Discharge recommendation remains appropriate.    Follow Up Recommendations  SNF    Equipment Recommendations  Other (comment)(TBD)    Recommendations for Other Services      Precautions / Restrictions Precautions Precautions: Fall Restrictions Weight Bearing Restrictions: No       Mobility Bed Mobility Overal bed mobility: Needs Assistance Bed Mobility: Rolling           General bed  mobility comments: Deferred for pt safety/comfort. Pt requires max/total assist to reposition in bed using pillows for support of BUE and trunk positioning this date.  Transfers                      Balance Overall balance assessment: Needs assistance Sitting-balance support: Single extremity supported;Feet supported Sitting balance-Leahy Scale: Zero Sitting balance - Comments: Pt requires max support for positioning in bed this date.                                   ADL either performed or assessed with clinical judgement   ADL Overall ADL's : Needs assistance/impaired                                       General ADL Comments: Pt continues to be functionally limited by cardiopulmonary status, pain in BLE/BUE, generalized weakness, and decreased activity tolerance. Requires moderate assist for bed level grooming and self feeding. Max A for LB ADL management in seated position. Total Assist for bed level toileting.     Vision Baseline Vision/History: Wears glasses Wears Glasses: At all times     Perception     Praxis      Cognition Arousal/Alertness: Awake/alert Behavior During Therapy: [facility] for tasks assessed/performed Overall Cognitive Status: History of cognitive impairments - at baseline  General Comments: Pt pleasant and eager to participate         Exercises General Exercises - Upper Extremity Shoulder Flexion: PROM;Both;5 reps Shoulder ABduction: PROM;Both;5 reps Shoulder ADduction: Both;5 reps Shoulder Horizontal ABduction: PROM;Both;5 reps Shoulder Horizontal ADduction: PROM;Both;5 reps Elbow Flexion: PROM;Both;5 reps Elbow Extension: PROM;Both;5 reps Other Exercises Other Exercises: OT engages pt in functional self-feeding/drinking task and provides postural support and positioning to maximize pt comfort, safety, and participation in additional self-feeding tasks with SLP  this date.   Shoulder Instructions       General Comments Pt on RA t/o session O2 sats remain WFLs >95% t/o session. Pt room monitor recorded BP during session as 80/51. Of note, pt had BP cuff on R forearm at the time which he was also using to hold a cup. Pt deniese adverse symptoms this date.    Pertinent Vitals/ Pain       Faces Pain Scale: Hurts whole lot Pain Location: Endorses generalized pain, but endorses significant discomfort w/ BUE movement. Pain Descriptors / Indicators: Sore;Grimacing;Guarding Pain Intervention(s): Limited activity within patient's tolerance;Monitored during session;RN gave pain meds during session;Repositioned  Home Living                                          Prior Functioning/Environment              Frequency  Min 2X/week        Progress Toward Goals  OT Goals(current goals can now be found in the care plan section)  Progress towards OT goals: Progressing toward goals  Acute Rehab OT Goals Patient Stated Goal: get strong enough to go back home OT Goal Formulation: With patient Time For Goal Achievement: 07/18/19 Potential to Achieve Goals: Good  Plan Discharge plan remains appropriate;Frequency remains appropriate    Co-evaluation    PT/OT/SLP Co-Evaluation/Treatment: Yes Reason for Co-Treatment: Complexity of the patient's impairments (multi-system involvement)   OT goals addressed during session: ADL's and self-care;Strengthening/ROM SLP goals addressed during session: Swallowing;Cognition    AM-PAC OT "6 Clicks" Daily Activity     Outcome Measure   Help from another person eating meals?: A Little Help from another person taking care of personal grooming?: A Little Help from another person toileting, which includes using toliet, bedpan, or urinal?: A Lot Help from another person bathing (including washing, rinsing, drying)?: A Lot Help from another person to put on and taking off regular upper body  clothing?: A Little Help from another person to put on and taking off regular lower body clothing?: A Lot 6 Click Score: 15    End of Session    OT Visit Diagnosis: Other abnormalities of gait and mobility (R26.89);Muscle weakness (generalized) (M62.81);Pain Pain - Right/Left: (both) Pain - part of body: Shoulder;Arm;Leg;Knee   Activity Tolerance Patient limited by fatigue;Patient limited by pain   Patient Left in bed;with call bell/phone within reach;with bed alarm set;Other (comment)(with SLP in room during session.)   Nurse Communication          Time: [phone] OT Time Calculation (min): 33 min  Charges: OT General Charges $OT Visit: 1 Visit OT Treatments $Self Care/Home Management : 8-22 mins $Therapeutic Exercise: 8-22 mins  Shara Blazing, M.S., OTR/L Ascom: [phone] 07/08/19, 2:06 PM

## 2019-07-08 NOTE — Progress Notes (Signed)
    BRIEF OVERNIGHT PROGRESS REPORT  SUBJECTIVE: Persistent fevers, tachycardia and hypotension  OBJECTIVE: febrile  (102.7) with blood pressure 86/52 mm Hg and pulse rate 81 beats/min. There were no focal neurological deficits.  ASSESSMENT:78 y.o.Caucasian malewith a known history of rheumatoid arthritis on methotrexate, type 2 diabetes mellitus, hypertension, PAD, GERD and seizure admitted with sepsis, COVID 19.  PLAN: 1. Sepsis - Persistent fever, tachycardia and hypotension. Unclear source of infection, recent COVID infection completed Remdesevir - CTA chest neg for PE - Lactic acid trending down - Monitor fever curve - Elevated  procalcitonin - Blood and urine cultures negative thus far - Continue Empiric abx with Vancomycin, Flagly and Meropenem - Received Bolus with improvement in BP - Consider ID consult if fever persist with no clear source  2. Hypertension - Now hypotensive in the setting of sepsis as above - Will hold Losartan and HCTZ for now  3. Hypokalemia - will replete and recheck in am +mag   Rufina Falco, DNP, CCRN, FNP-C [facility] Nurse Practitioner Between 7pm to [place] - Pager [phone]  After 7am go to [email or web address] - [email or web address]  [facility] [employer]  [phone]

## 2019-07-08 NOTE — Progress Notes (Signed)
Patient ID: Jeremiah Royals., male   DOB: 11-29-1940, 79 y.o.   MRN: [id number] [facility] PROGRESS NOTE  Jeremiah Royals. [id number] DOB: 29-Jul-1940 DOA: 07/02/2019 PCP: Derinda Late, MD  HPI/Subjective: Patient sleepy but does answer some questions.  Had fever last night.  Son noticing that skin and joints are very sensitive to the touch and painful for the patient.  Patient denies any cough or shortness of breath.  No chest pain or abdominal pain.  No nausea vomiting or diarrhea.  Son also mentions that he did have a reaction to methotrexate previously and he thinks the patient was recently on it.  Objective: Vitals:   07/08/19 0800 07/08/19 0900  BP: 107/70   Pulse: (!) 113   Resp: 16   Temp:  98.4 F (36.9 C)  SpO2: 95%     Intake/Output Summary (Last 24 hours) at 07/08/2019 1319 Last data filed at 07/08/2019 0243 Gross per 24 hour  Intake 2922 ml  Output --  Net 2922 ml   Filed Weights   07/02/19 1613 07/08/19 0500  Weight: 104.3 kg 106.4 kg    ROS: Review of Systems  Constitutional: Positive for fever. Negative for chills.  Eyes: Negative for blurred vision.  Respiratory: Negative for cough and shortness of breath.   Cardiovascular: Negative for chest pain.  Gastrointestinal: Negative for abdominal pain, constipation, diarrhea, nausea and vomiting.  Genitourinary: Negative for dysuria.  Musculoskeletal: Positive for joint pain.  Neurological: Negative for dizziness and headaches.   Exam: Physical Exam  HENT:  Nose: No mucosal edema.  Mouth/Throat: No oropharyngeal exudate or posterior oropharyngeal edema.  Eyes: Pupils are equal, round, and reactive to light. Conjunctivae, EOM and lids are normal.  Neck: No JVD present. Carotid bruit is not present. No thyroid mass and no thyromegaly present.  Cardiovascular: S1 normal and S2 normal. Exam reveals no gallop.  No murmur heard. Respiratory: No respiratory distress. He has decreased breath sounds in  the right lower field and the left lower field. He has no wheezes. He has no rhonchi. He has no rales.  GI: Soft. Bowel sounds are normal. There is no abdominal tenderness.  Musculoskeletal:     Cervical back: No edema.     Right ankle: Swelling present.     Left ankle: Swelling present.  Lymphadenopathy:    He has no cervical adenopathy.  Neurological: He is alert. No cranial nerve deficit.  Barely able to straight leg raise with the left leg.  Able to straight leg raise better with the right leg.  Skin: Skin is warm. Nails show no clubbing.  Chronic lower extremity skin discoloration  Psychiatric: He has a normal mood and affect.      Data Reviewed: Basic Metabolic Panel: Recent Labs  Lab 07/04/19 0548 07/05/19 0552 07/06/19 0349 07/07/19 0421 07/07/19 2251  NA 136 137 135 141 142  K 3.1* 3.3* 3.1* 3.3* 3.2*  CL 103 103 100 105 111  CO2 25 26 25 23 24   GLUCOSE 103* 99 98 89 152*  BUN 17 18 19 21  26*  CREATININE 0.79 0.86 0.90 0.95 0.93  CALCIUM 8.5* 8.7* 8.3* 8.4* 8.1*  MG 1.9 2.0  --  2.1  --    Liver Function Tests: Recent Labs  Lab 07/03/19 0742 07/04/19 0548 07/05/19 0552 07/06/19 0349 07/07/19 0421  AST 13* 11* 11* 13* 15  ALT 10 8 9 9 10   ALKPHOS 102 86 92 92 96  BILITOT 0.6 1.0 0.8 0.8 1.0  PROT 7.0 6.4* 6.7 6.8 6.4*  ALBUMIN 2.9* 2.6* 2.6* 2.5* 2.4*   Recent Labs  Lab 07/02/19 1627  LIPASE 25   CBC: Recent Labs  Lab 07/04/19 0548 07/05/19 0552 07/06/19 0349 07/07/19 0421 07/07/19 2251  WBC 7.1 6.9 11.6* 13.2* 11.1*  NEUTROABS 5.0 4.5 8.7* 10.0* 8.2*  HGB 10.4* 11.3* 11.9* 11.8* 11.1*  HCT 32.9* 34.8* 35.9* 36.5* 34.5*  MCV 85.2 84.1 83.9 84.9 84.1  PLT 220 228 269 281 289   BNP (last 3 results) Recent Labs    04/12/19 0504  BNP 27.0     CBG: Recent Labs  Lab 07/07/19 2037 07/08/19 0055 07/08/19 0254 07/08/19 0756 07/08/19 1142  GLUCAP 133* 130* 114* 104* 124*    Recent Results (from the past 240 hour(s))  Blood  Culture (routine x 2)     Status: None   Collection Time: 07/02/19  4:27 PM   Specimen: BLOOD  Result Value Ref Range Status   Specimen Description BLOOD Blood Culture adequate volume  Final   Special Requests   Final    BOTTLES DRAWN AEROBIC AND ANAEROBIC LEFT ANTECUBITAL   Culture   Final    NO GROWTH 5 DAYS Performed at [facility], [address] [place], [place] [address]    Report Status 07/07/2019 FINAL  Final  Urine culture     Status: None   Collection Time: 07/02/19  5:16 PM   Specimen: Urine, Random  Result Value Ref Range Status   Specimen Description   Final    URINE, RANDOM Performed at [facility], [address], [place], [place] [address]    Special Requests   Final    NONE Performed at [facility], [address] [place], [place] [address]    Culture   Final    NO GROWTH Performed at [facility], [address] [place], [place] [address]    Report Status 07/04/2019 FINAL  Final  Respiratory Panel by RT PCR (Flu A&B, Covid) - Nasopharyngeal Swab     Status: Abnormal   Collection Time: 07/02/19  6:35 PM   Specimen: Nasopharyngeal Swab  Result Value Ref Range Status   SARS Coronavirus 2 by RT PCR POSITIVE (A) NEGATIVE Final    Comment: RESULT CALLED TO, READ BACK BY AND VERIFIED WITH: ANDREA BRYANT 07/02/19 1940 KLW (NOTE) SARS-CoV-2 target nucleic acids are DETECTED. SARS-CoV-2 RNA is generally detectable in upper respiratory specimens  during the acute phase of infection. Positive results are indicative of the presence of the identified virus, but do not rule out bacterial infection or co-infection with other pathogens not detected by the test. Clinical correlation with patient history and other diagnostic information is necessary to determine patient infection status. The expected result is Negative. Fact Sheet for Patients:  PinkCheek.be Fact Sheet for Healthcare  Providers: GravelBags.it This test is not yet approved or cleared by the [place] FDA and  has been authorized for detection and/or diagnosis of SARS-CoV-2 by FDA under an Emergency Use Authorization (EUA).  This EUA will remain in effect (meaning this test can be used) for th e duration of  the COVID-19 declaration under Section 564(b)(1) of the Act, 21 U.S.C. section 360bbb-3(b)(1), unless the authorization is terminated or revoked sooner.    Influenza A by PCR NEGATIVE NEGATIVE Final   Influenza B by PCR NEGATIVE NEGATIVE Final    Comment: (NOTE) The Xpert Xpress SARS-CoV-2/FLU/RSV assay is intended as an aid in  the diagnosis  of influenza from Nasopharyngeal swab specimens and  should not be used as a sole basis for treatment. Nasal washings and  aspirates are unacceptable for Xpert Xpress SARS-CoV-2/FLU/RSV  testing. Fact Sheet for Patients: PinkCheek.be Fact Sheet for Healthcare Providers: GravelBags.it This test is not yet approved or cleared by the [place] FDA and  has been authorized for detection and/or diagnosis of SARS-CoV-2 by  FDA under an Emergency Use Authorization (EUA). This EUA will remain  in effect (meaning this test can be used) for the duration of the  Covid-19 declaration under Section 564(b)(1) of the Act, 21  U.S.C. section 360bbb-3(b)(1), unless the authorization is  terminated or revoked. Performed at [facility], [address] [place], [place] [address]   CULTURE, BLOOD (ROUTINE X 2) w Reflex to ID Panel     Status: None (Preliminary result)   Collection Time: 07/05/19  7:42 PM   Specimen: BLOOD  Result Value Ref Range Status   Specimen Description BLOOD LEFT ANTECUBITAL  Final   Special Requests   Final    BOTTLES DRAWN AEROBIC ONLY Blood Culture adequate volume   Culture   Final    NO GROWTH 3 DAYS Performed at [facility], [address] [place], [place] [address]    Report Status PENDING  Incomplete  CULTURE, BLOOD (ROUTINE X 2) w Reflex to ID Panel     Status: None (Preliminary result)   Collection Time: 07/05/19  7:45 PM   Specimen: BLOOD  Result Value Ref Range Status   Specimen Description BLOOD LEFT HAND  Final   Special Requests   Final    BOTTLES DRAWN AEROBIC ONLY Blood Culture adequate volume   Culture   Final    NO GROWTH 3 DAYS Performed at [facility], [address], [place], [place] [address]    Report Status PENDING  Incomplete  MRSA PCR Screening     Status: None   Collection Time: 07/05/19  7:55 PM   Specimen: Nasal Mucosa; Nasopharyngeal  Result Value Ref Range Status   MRSA by PCR NEGATIVE NEGATIVE Final    Comment:        The GeneXpert MRSA Assay (FDA approved for NASAL specimens only), is one component of a comprehensive MRSA colonization surveillance program. It is not intended to diagnose MRSA infection nor to guide or monitor treatment for MRSA infections. Performed at [facility], [address], [place], [place] [address]   Urine Culture     Status: Abnormal   Collection Time: 07/06/19  8:22 AM   Specimen: Urine, Random  Result Value Ref Range Status   Specimen Description   Final    URINE, RANDOM Performed at [facility], [address], [place], [place] [address]    Special Requests   Final    NONE Performed at [facility], [address] [place],  [address]    Culture MULTIPLE SPECIES PRESENT, SUGGEST RECOLLECTION (A)  Final   Report Status 07/07/2019 FINAL  Final      Scheduled Meds: . vitamin C  500 mg Oral Daily  . aspirin  81 mg Oral Daily  . Chlorhexidine Gluconate Cloth  6 each Topical Daily  . cholecalciferol  1,000 Units Oral Daily  . ciprofloxacin  1 drop Both Eyes Q4H while awake  . enoxaparin (LOVENOX) injection  40 mg Subcutaneous Q24H  . famotidine  20 mg Oral BID  . feeding supplement  (ENSURE ENLIVE)  237 mL Oral TID  BM  . folic acid  1 mg Oral Daily  . hydroxychloroquine  200 mg Oral Daily  . insulin aspart  0-9 Units Subcutaneous TID AC & HS  . lamoTRIgine  200 mg Oral BID  . lamoTRIgine  25 mg Oral BID  . magnesium oxide  400 mg Oral Daily  . mouth rinse  15 mL Mouth Rinse BID  . methylPREDNISolone (SOLU-MEDROL) injection  40 mg Intravenous Daily  . metoprolol tartrate  25 mg Per Tube BID  . nystatin  5 mL Oral QID  . pantoprazole  40 mg Oral Daily  . polyethylene glycol  17 g Oral Daily  . thiamine  100 mg Oral Daily  . traZODone  50 mg Oral QHS  . zinc sulfate  220 mg Oral Daily   Continuous Infusions: . sodium chloride 100 mL/hr at 07/08/19 0842  . sodium chloride Stopped (07/06/19 1123)  . meropenem (MERREM) IV 1 g (07/08/19 1233)  . vancomycin 750 mg (07/08/19 1231)    Assessment/Plan:  1. Fever with unclear etiology.  If this was secondary to Covid I was thinking that the temperature curve would have come down by now.  On empiric antibiotics but not quite sure what I am treating.  Infectious disease consultation requested.  Since the patient does have rheumatoid arthritis, I will give a trial of steroids.  Could be a methotrexate reaction and steroids would potentially help this also.  Will also give IV folic acid. 2. COVID-19 infection.  Completed a course of remdesivir.  Solu-Medrol started 3. Acute metabolic encephalopathy likely from fever and being in the hospital 4. Seizure disorder on Lamictal 5. Essential hypertension.  Holding Hyzaar and Lopressor secondary to hypotension. 6. Hypokalemia replacing potassium 7. Weakness physical therapy evaluation  Code Status:     Code Status Orders  (From admission, onward)         Start     Ordered   07/02/19 2017  Full code  Continuous     07/02/19 2023        Code Status History    Date Active Date Inactive Code Status Order ID Comments User Context   04/22/2019 1038 04/27/2019 2053 Full Code  [id number]  [name], MD Inpatient   04/21/2019 1313 04/22/2019 1038 DNR [id number]  Jimmy Footman, NP Inpatient   03/31/2019 1819 04/21/2019 1313 Full Code [id number]  Lavina Hamman, MD Inpatient   02/20/2018 2220 02/24/2018 2019 Full Code [id number]  Lance Coon, MD ED   05/23/2015 2221 05/26/2015 1741 Full Code [id number]  Hower, Aaron Mose, MD ED   Advance Care Planning Activity     Family Communication: spoke with son on the phone Disposition Plan: Fever will have to dissipate first.  Could be a reaction to methotrexate.  Consultants:  Infectious disease  Antibiotics:  Currently on vancomycin and meropenem  Time spent: 28 minutes  [place]

## 2019-07-08 NOTE — Consult Note (Signed)
[facility]  Date of admission:  07/02/2019  Inpatient day:  07/08/2019  Consulting physician: Dr Loletha Grayer.  Reason for Consultation:  ? Infectious disease rec consult.  Chief Complaint: Jeremiah [name]. is a 79 y.o. male with rheumatoid arthritis on weekly methotrexate who was admitted through the emergency room with COVID-19, generalized weakness, fever, chills, and acute encephalopathy.  HPI:  The patient is a poor historian.  History is primarily obtained from the chart and my notes from his last admission.  The patient was admitted to [facility] from 03/31/2019 - 04/27/2019 with bilateral lower extremity cellulitis unresponsive to outpatient management in conjunction with acute kidney injury (Cr 3.12).  He was initially treated with ciprofloxacin and clindamycin in the outpatient department.  He was switched to vancomycin and then doxycycline.  Cultures remained negative.  He was later felt to have severe lymphedema/venous edema mimicking cellulitis.  While hospitalized, he developed altered mental status.  Head MRI was negative.  EEG revealed slowing.  TSH, B12, B1, and ammonia were normal.  He was felt to have baseline dementia and a decline prior to hospitalization with overlying metabolic encephalopathy.  He was unable to eat safely.  He underwent PEG tube placement on 04/24/2019.  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  He had not been taking folic acid.  He had folic acid deficiency and was started on oral folate. Nadir counts on 04/05/2019 revealed a hematocrit of 27.0, hemoglobin 8.9, MCV 85.2, platelets 81,000, and WBC 1000 (ANC 200).  He received GCSF (Granix) from 04/11/2019 - 04/14/2019.  Counts on 04/24/2019 revealed a hematocrit of 26.6, hemoglobin 8.6, platelets 570,000, and WBC 15,200.  Peripheral blood flow cytometry on 04/09/2020 revealed  1% myeloblasts in the setting of pancytopenia and phenotypic aberrancies of  neutrophils and monocytes.  There was a CD5-, CD10- clonal B cell population, non specific phenotype, 2% of leukocytes (< 5000/uL) c/w a monoclonal B-cell lymphocytosis.  Bone marrow on 04/10/2019 revealed a hypercellular marrow with erythroid hyperplasia, rare ringed sideroblasts and dyspoiesis.  There was polytic plasmacytosis (6-10%).  The plasma cells coexpressed CD56 (unusual) but appeared polytypic by kappa and lambda in situ hybridization.  There were no increased blasts.  Cytogenetics were normal (86, XX).  Bone marrow findings were felt likely related to methotrexate or possibly a myelodysplastic syndrome (MDS).  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  MYD88  L265P can be seen in lymphoplasmacytic lymphoma/Wldenstrom's macroglobulinemia, IgM MGUS, CLL, marginal zone lymphoma, splenic lymphoma, and some non-germinal center subtype diffuse large B celll lymphomas.  The patient was discharged to skilled nursing ([facility]).  He developed COVID-19 at [facility] in 05/2019.  He was discharged to home later in 05/2019.  He began eating by mouth.  PEG tube was removed in late 05/2019.  He received methotrexate on 06/22/2019 and 06/29/2019.  He presented to the [facility] ER via ambulance per patient.  He had a temperature of 102.1 and chills.  He was confused.  He was diffusely painful to touch.  COVID testing was + by PCR.  Influenza A and B were negative.   Blood cultures were negative.  Urine cultures grew multiple species.  He initially received aztreonam, Flagyl, and vancomycin.  He received remdesivir.  Meropenem was added on 07/05/2019 and vancomycin on 07/06/2019.  He was started on Solumedrol on 07/08/2019 for rheumatoid flare.  CT angiogram on 07/05/2019 revealed no pulmonary embolism.  There were  no enlarged mediastinal, hilar or axillary adenopathy.  Symptomatically, he states that all of his joints hurt.  Airborne  precautions were lifted today    Past Medical History:  Diagnosis Date  . Allergy   . Arthritis   . Collagen vascular disease ([place])   . Hypertension   . Rheumatoid arthritis ([place])   . Seizures ([place])   . Sepsis ([place]) 05/23/2015    Past Surgical History:  Procedure Laterality Date  . BACK SURGERY    . back surgery five times  04/2016  . CARPAL TUNNEL RELEASE    . HERNIA REPAIR    . JOINT REPLACEMENT    . knee replacement right    . PEG PLACEMENT N/A 04/24/2019   Procedure: PERCUTANEOUS ENDOSCOPIC GASTROSTOMY (PEG) PLACEMENT;  Surgeon: Toledo, Benay Pike, MD;  Location: [facility] ENDOSCOPY;  Service: Gastroenterology;  Laterality: N/A;  . right hip replacement    . rotator cuff surgery    . SPINE SURGERY      Family History  Problem Relation Age of Onset  . Hypertension Other   . Hypertension Mother   . Dementia Mother   . Dementia Father   . Prostate cancer Neg Hx   . Bladder Cancer Neg Hx   . Kidney cancer Neg Hx     Social History:  reports that he has quit smoking. His smoking use included cigarettes. He has a 20.00 pack-year smoking history. He has never used smokeless tobacco. He reports that he does not drink alcohol or use drugs.  He denies any exposure to radiation or toxins.  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  He lives in [place].  His wife's name is Jeremiah [name].  He is alone today.  Allergies:  Allergies  Allergen Reactions  . Cefoperazone Anaphylaxis  . Penicillins Anaphylaxis and Other (See Comments)    Has patient had a PCN reaction causing immediate rash, facial/tongue/throat swelling, SOB or lightheadedness with hypotension: Yes Has patient had a PCN reaction causing severe rash involving mucus membranes or skin necrosis: No Has patient had a PCN reaction that required hospitalization No Has patient had a PCN reaction occurring within the last 10 years: No If all of the  above answers are "NO", then may proceed with Cephalosporin use.  . Latex Rash    Medications Prior to Admission  Medication Sig Dispense Refill  . acetaminophen (TYLENOL) 325 MG tablet Take 2 tablets (650 mg total) by mouth every 6 (six) hours as needed for mild pain (or Fever >/= 101).    Marland Kitchen aspirin 81 MG chewable tablet Chew 81 mg by mouth daily.    . Cinnamon 500 MG capsule Take 500 mg by mouth 2 (two) times daily.    . folic acid (FOLVITE) 1 MG tablet Take 1 mg by mouth daily.    . furosemide (LASIX) 40 MG tablet Take 40 mg by mouth daily.    . hydroxychloroquine (PLAQUENIL) 200 MG tablet Take 200 mg by mouth daily.    Marland Kitchen lamoTRIgine (LAMICTAL) 200 MG tablet Take 200 mg by mouth 2 (two) times daily.    Marland Kitchen lamoTRIgine (LAMICTAL) 25 MG tablet Take 25 mg by mouth 2 (two) times daily.    Marland Kitchen losartan-hydrochlorothiazide (HYZAAR) 50-12.5 MG tablet Take 1 tablet by mouth daily.    . magnesium oxide (MAG-OX) 400 MG tablet Take 400 mg by mouth daily.    Marland Kitchen omeprazole (  PRILOSEC) 40 MG capsule Take 40 mg by mouth daily as needed.     . thiamine 100 MG tablet Take 1 tablet (100 mg total) by mouth daily. 30 tablet 0  . traZODone (DESYREL) 50 MG tablet Take 50 mg by mouth at bedtime.    . Amino Acids-Protein Hydrolys (FEEDING SUPPLEMENT, PRO-STAT SUGAR FREE 64,) LIQD Place 30 mLs into feeding tube 2 (two) times daily between meals. (Patient not taking: Reported on 07/02/2019) 887 mL 0  . metoprolol tartrate (LOPRESSOR) 25 MG tablet Place 0.5 tablets (12.5 mg total) into feeding tube 2 (two) times daily. (Patient not taking: Reported on 07/02/2019) 60 tablet 0  . Nutritional Supplements (FEEDING SUPPLEMENT, OSMOLITE 1.5 CAL,) LIQD Place 1,000 mLs into feeding tube continuous. (Patient not taking: Reported on 07/02/2019) 100 mL 0  . nystatin-triamcinolone ointment (MYCOLOG) Apply 1 application topically 2 (two) times daily. (Patient not taking: Reported on 07/02/2019) 30 g 2  . pantoprazole (PROTONIX) 40 MG  tablet Take 1 tablet (40 mg total) by mouth daily. (Patient not taking: Reported on 07/02/2019) 40 tablet 0  . polyethylene glycol (MIRALAX / GLYCOLAX) packet Take 17 g by mouth daily. (Patient not taking: Reported on 07/02/2019) 14 each 0  . Water For Irrigation, Sterile (FREE WATER) SOLN Place 150 mLs into feeding tube every 4 (four) hours. (Patient not taking: Reported on 07/02/2019) 500 mL 0    Review of Systems: Patient unclear of events leading to hospitalization.  He notes that his family called 42.  He was unaware of any fever at home. GENERAL:  Fatigue.  No apparent fevers, sweats or weight loss. PERFORMANCE STATUS (ECOG):  2-3 HEENT:  Patient denies any visual changes, runny nose, sore throat, mouth sores or tenderness. Lungs: He denies shortness of breath or cough.  No hemoptysis. Cardiac:  He denies chest pain, palpitations, orthopnea, or PND. GI:  He denies nausea, vomiting, diarrhea, constipation, melena or hematochezia. GU:  He denies urgency, frequency, dysuria, or hematuria. Musculoskeletal:  He notes significant DIFFUSE JOINT PAIN. He denies back pain. No muscle tenderness. Extremities:  He notes issues with lower extremity swelling. Skin:  No rashes or skin changes. Neuro:  Patient notes intermittent confusion.  He denies any headache, focal weakness or numbness. Endocrine:  No diabetes, thyroid issues, hot flashes or night sweats. Psych:  No mood changes, depression or anxiety. Pain:   DIFFUSE JOINT PAIN. Review of systems:  All other systems reviewed and found to be negative.  Physical Exam:  Blood pressure (!) 88/45, pulse 80, temperature 98.5 F (36.9 C), temperature source Axillary, resp. rate 18, height '6\' 1"'$  (1.854 m), weight 234 lb 9.1 oz (106.4 kg), SpO2 95 %.  GENERAL:  Well developed, well nourished, elderly gentleman lying comfortably on the medical unit in no acute distress. MENTAL STATUS:  Alert and oriented to person. HEAD:  Near alopecia.  Normocephalic,  atraumatic, face symmetric, no Cushingoid features. EYES:  Blue eyes.  Pupils equal round and reactive to light and accomodation.  No conjunctivitis or scleral icterus. ENT:  Oropharynx clear without lesion.  Edentulous.  Tongue normal. Mucous membranes dry.  RESPIRATORY:  Clear to auscultation anteriorly without rales, wheezes or rhonchi. CARDIOVASCULAR:  Regular rate and rhythm without murmur, rub or gallop. ABDOMEN:  Fully round.  Soft, non-tender, with active bowel sounds, and no appreciable hepatosplenomegaly.  No masses. SKIN:  No rashes, ulcers or lesions. EXTREMITIES:  Patient painful to touch over may joints (shoulders, wrists, elbows, and hands).  Chronic bilateral lower extremity  changes most pronounced at the ankles and feet.  No edema or skin discoloration.  No palpable cords. LYMPH NODES: No palpable cervical, supraclavicular, axillary or inguinal adenopathy  NEUROLOGICAL: Patient moves all 4 extremities.  Answers questions.  Confused at times. PSYCH:  Appropriate. Engaging.    Results for orders placed or performed during the hospital encounter of 07/02/19 (from the past 48 hour(s))  CBC with Differential/Platelet     Status: Abnormal   Collection Time: 07/07/19  4:21 AM  Result Value Ref Range   WBC 13.2 (H) 4.0 - 10.5 K/uL   RBC 4.30 4.22 - 5.81 MIL/uL   Hemoglobin 11.8 (L) 13.0 - 17.0 g/dL   HCT 36.5 (L) 39.0 - 52.0 %   MCV 84.9 80.0 - 100.0 fL   MCH 27.4 26.0 - 34.0 pg   MCHC 32.3 30.0 - 36.0 g/dL   RDW 15.3 11.5 - 15.5 %   Platelets 281 150 - 400 K/uL   nRBC 0.0 0.0 - 0.2 %   Neutrophils Relative % 75 %   Neutro Abs 10.0 (H) 1.7 - 7.7 K/uL   Lymphocytes Relative 8 %   Lymphs Abs 1.1 0.7 - 4.0 K/uL   Monocytes Relative 15 %   Monocytes Absolute 2.0 (H) 0.1 - 1.0 K/uL   Eosinophils Relative 0 %   Eosinophils Absolute 0.0 0.0 - 0.5 K/uL   Basophils Relative 1 %   Basophils Absolute 0.1 0.0 - 0.1 K/uL   Immature Granulocytes 1 %   Abs Immature Granulocytes 0.06  0.00 - 0.07 K/uL    Comment: Performed at [facility], [address] [place], [place] [address]  Comprehensive metabolic panel     Status: Abnormal   Collection Time: 07/07/19  4:21 AM  Result Value Ref Range   Sodium 141 135 - 145 mmol/L   Potassium 3.3 (L) 3.5 - 5.1 mmol/L   Chloride 105 98 - 111 mmol/L   CO2 23 22 - 32 mmol/L   Glucose, Bld 89 70 - 99 mg/dL   BUN 21 8 - 23 mg/dL   Creatinine, Ser 0.95 0.61 - 1.24 mg/dL   Calcium 8.4 (L) 8.9 - 10.3 mg/dL   Total Protein 6.4 (L) 6.5 - 8.1 g/dL   Albumin 2.4 (L) 3.5 - 5.0 g/dL   AST 15 15 - 41 U/L   ALT 10 0 - 44 U/L   Alkaline Phosphatase 96 38 - 126 U/L   Total Bilirubin 1.0 0.3 - 1.2 mg/dL   GFR calc non Af Amer >60 >60 mL/min   GFR calc Af Amer >60 >60 mL/min   Anion gap 13 5 - 15    Comment: Performed at [facility], [address] [place], [place] [address]  C-reactive protein     Status: Abnormal   Collection Time: 07/07/19  4:21 AM  Result Value Ref Range   CRP 29.0 (H) <1.0 mg/dL    Comment: Performed at [facility], [address] [place], [place] [address]  Fibrin derivatives D-Dimer [facility] only)     Status: Abnormal   Collection Time: 07/07/19  4:21 AM  Result Value Ref Range   Fibrin derivatives D-dimer ([facility]) 2,454.12 (H) 0.00 - 499.00 ng/mL (FEU)    Comment: (NOTE) <> Exclusion of Venous Thromboembolism (VTE) - OUTPATIENT ONLY   (Emergency Department or [place])   0-499 ng/ml (FEU): With a low to intermediate pretest probability  for VTE this test result excludes the diagnosis                      of VTE.   >499 ng/ml (FEU) : VTE not excluded; additional work up for VTE is                      required. <> Testing on Inpatients and Evaluation of Disseminated Intravascular   Coagulation (DIC) Reference Range:   0-499 ng/ml (FEU) Performed at [facility], [address] [place], [place] [address]   Ferritin     Status: None   Collection Time:  07/07/19  4:21 AM  Result Value Ref Range   Ferritin 210 24 - 336 ng/mL    Comment: Performed at [facility], [address] [place], [place] [address]  Procalcitonin     Status: None   Collection Time: 07/07/19  4:21 AM  Result Value Ref Range   Procalcitonin 1.31 ng/mL    Comment:        Interpretation: PCT > 0.5 ng/mL and <= 2 ng/mL: Systemic infection (sepsis) is possible, but other conditions are known to elevate PCT as well. (NOTE)       Sepsis PCT Algorithm           Lower Respiratory Tract                                      Infection PCT Algorithm    ----------------------------     ----------------------------         PCT < 0.25 ng/mL                PCT < 0.10 ng/mL         Strongly encourage             Strongly discourage   discontinuation of antibiotics    initiation of antibiotics    ----------------------------     -----------------------------       PCT 0.25 - 0.50 ng/mL            PCT 0.10 - 0.25 ng/mL               OR       >80% decrease in PCT            Discourage initiation of                                            antibiotics      Encourage discontinuation           of antibiotics    ----------------------------     -----------------------------         PCT >= 0.50 ng/mL              PCT 0.26 - 0.50 ng/mL                AND       <80% decrease in PCT             Encourage initiation of  antibiotics       Encourage continuation           of antibiotics    ----------------------------     -----------------------------        PCT >= 0.50 ng/mL                  PCT > 0.50 ng/mL               AND         increase in PCT                  Strongly encourage                                      initiation of antibiotics    Strongly encourage escalation           of antibiotics                                     -----------------------------                                           PCT <= 0.25  ng/mL                                                 OR                                        > 80% decrease in PCT                                     Discontinue / Do not initiate                                             antibiotics Performed at [facility], [address], [place], [place] [address]   Magnesium     Status: None   Collection Time: 07/07/19  4:21 AM  Result Value Ref Range   Magnesium 2.1 1.7 - 2.4 mg/dL    Comment: Performed at [facility], [address] [place], [place] [address]  Glucose, capillary     Status: None   Collection Time: 07/07/19  9:31 AM  Result Value Ref Range   Glucose-Capillary 84 70 - 99 mg/dL  Glucose, capillary     Status: Abnormal   Collection Time: 07/07/19 12:47 PM  Result Value Ref Range   Glucose-Capillary 101 (H) 70 - 99 mg/dL  Glucose, capillary     Status: Abnormal   Collection Time: 07/07/19  4:39 PM  Result Value Ref Range   Glucose-Capillary 121 (H) 70 - 99 mg/dL  Glucose, capillary     Status: Abnormal   Collection Time: 07/07/19  8:37 PM  Result Value Ref Range  Glucose-Capillary 133 (H) 70 - 99 mg/dL  Lactic acid, plasma     Status: Abnormal   Collection Time: 07/07/19 10:51 PM  Result Value Ref Range   Lactic Acid, Venous 2.1 (HH) 0.5 - 1.9 mmol/L    Comment: CRITICAL RESULT CALLED TO, READ BACK BY AND VERIFIED WITH JAZZMINE OATES AT 2347 ON 07/07/19 RWW Performed at [facility], [address] [place], [place] [address]   CBC with Differential/Platelet     Status: Abnormal   Collection Time: 07/07/19 10:51 PM  Result Value Ref Range   WBC 11.1 (H) 4.0 - 10.5 K/uL   RBC 4.10 (L) 4.22 - 5.81 MIL/uL   Hemoglobin 11.1 (L) 13.0 - 17.0 g/dL   HCT 34.5 (L) 39.0 - 52.0 %   MCV 84.1 80.0 - 100.0 fL   MCH 27.1 26.0 - 34.0 pg   MCHC 32.2 30.0 - 36.0 g/dL   RDW 15.3 11.5 - 15.5 %   Platelets 289 150 - 400 K/uL   nRBC 0.0 0.0 - 0.2 %   Neutrophils Relative % 74 %   Neutro Abs 8.2  (H) 1.7 - 7.7 K/uL   Lymphocytes Relative 10 %   Lymphs Abs 1.1 0.7 - 4.0 K/uL   Monocytes Relative 16 %   Monocytes Absolute 1.8 (H) 0.1 - 1.0 K/uL   Eosinophils Relative 0 %   Eosinophils Absolute 0.0 0.0 - 0.5 K/uL   Basophils Relative 0 %   Basophils Absolute 0.0 0.0 - 0.1 K/uL   Immature Granulocytes 0 %   Abs Immature Granulocytes 0.04 0.00 - 0.07 K/uL    Comment: Performed at [facility], [address] [place], [place] [address]  Basic metabolic panel     Status: Abnormal   Collection Time: 07/07/19 10:51 PM  Result Value Ref Range   Sodium 142 135 - 145 mmol/L   Potassium 3.2 (L) 3.5 - 5.1 mmol/L   Chloride 111 98 - 111 mmol/L   CO2 24 22 - 32 mmol/L   Glucose, Bld 152 (H) 70 - 99 mg/dL   BUN 26 (H) 8 - 23 mg/dL   Creatinine, Ser 0.93 0.61 - 1.24 mg/dL   Calcium 8.1 (L) 8.9 - 10.3 mg/dL   GFR calc non Af Amer >60 >60 mL/min   GFR calc Af Amer >60 >60 mL/min   Anion gap 7 5 - 15    Comment: Performed at [facility], [address] [place], [place] [address]  Glucose, capillary     Status: Abnormal   Collection Time: 07/08/19 12:55 AM  Result Value Ref Range   Glucose-Capillary 130 (H) 70 - 99 mg/dL  Glucose, capillary     Status: Abnormal   Collection Time: 07/08/19  2:54 AM  Result Value Ref Range   Glucose-Capillary 114 (H) 70 - 99 mg/dL  Folate     Status: None   Collection Time: 07/08/19  2:55 AM  Result Value Ref Range   Folate 11.4 >5.9 ng/mL    Comment: Performed at [facility], [address] [place], [place] [address]  Lactic acid, plasma     Status: None   Collection Time: 07/08/19  2:56 AM  Result Value Ref Range   Lactic Acid, Venous 1.2 0.5 - 1.9 mmol/L    Comment: Performed at [facility], [address] [place], [place] [address]  Glucose, capillary     Status: Abnormal   Collection Time: 07/08/19  7:56 AM  Result Value Ref  Range   Glucose-Capillary 104 (H) 70 - 99 mg/dL  Glucose, capillary      Status: Abnormal   Collection Time: 07/08/19 11:42 AM  Result Value Ref Range   Glucose-Capillary 124 (H) 70 - 99 mg/dL  Glucose, capillary     Status: Abnormal   Collection Time: 07/08/19  4:37 PM  Result Value Ref Range   Glucose-Capillary 132 (H) 70 - 99 mg/dL  Glucose, capillary     Status: Abnormal   Collection Time: 07/08/19  8:51 PM  Result Value Ref Range   Glucose-Capillary 163 (H) 70 - 99 mg/dL   No results found.  Assessment:  The patient is a 79 y.o. gentleman rheumatoid arthritis and a history of pancytopenia s/p methotrexate toxicity in 03/2019.  He had not been taking folic acid.  He had folic acid deficiency and was started on oral folate. Nadir counts on 04/05/2019 revealed a hematocrit of 27.0, hemoglobin 8.9, MCV 85.2, platelets 81,000, and WBC 1000 (ANC 200).  He received GCSF (Granix) from 04/11/2019 - 04/14/2019.  Counts on 04/24/2019 revealed a hematocrit of 26.6, hemoglobin 8.6, platelets 570,000, and WBC 15,200.  Peripheral blood flow cytometry on 04/09/2020 revealed  1% myeloblasts in the setting of pancytopenia and phenotypic aberrancies of neutrophils and monocytes.  There was a CD5-, CD10- clonal B cell population, non specific phenotype, 2% of leukocytes (< 5000/uL) c/w a monoclonal B-cell lymphocytosis.  Bone marrow on 04/10/2019 revealed a hypercellular marrow with erythroid hyperplasia, rare ringed sideroblasts and dyspoiesis.  There was polytic plasmacytosis (6-10%).  The plasma cells coexpressed CD56 (unusual) but appeared polytypic by kappa and lambda in situ hybridization.  There were no increased blasts.  Cytogenetics were normal (15, XX).  Bone marrow findings were felt likely related to methotrexate or possibly a myelodysplastic syndrome (MDS).  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  He received methotrexate on 06/22/2019 and 06/29/2019.  Chest CT angiogram on 07/05/2019 revealed no pulmonary embolism.  There were no enlarged mediastinal, hilar or axillary adenopathy.  Symptomatically, he states that all of his joints hurt. Exam reveals no adenopathy or appreciable hepatosplenomegaly.    Plan:   1.   Hematology  Counts have been relatively normal with this admission:   Hematocrit 37.0, hemoglobin 11.6, MCV 86.7, platelets 261,000, WBC 8,700 (ANC 7300 and ALC 700) on 07/02/2019.   Hematocrit 34.5, hemoglobin 11.1, MCV 84.1, platelets 289,000, WBC 11,100 (ANC 8200 and ALC 1100) on 07/07/2019.  Prior pancytopenia was secondary to methotrexate toxicity.  Bone marrow was felt consistent.  Prior peripheral blood flow cytometry suggested a monoclonal B-cell lymphocytosis of unclear significance.  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  LDH was 118 (normal) on 07/02/2019.  Chest CT angiogram revealed no adenopathy.  Exam reveals no adenopathy or appreciable hepatosplenomegaly.  Repeat peripheral blood flow cytometry sent.  SPEP and immunoglobulins ordered.   If abnormality detected, will consider abdominal imaging. 2.   Dispostion   Anticipate follow-up in clinic after discharge.  Thank you for allowing me to participate in Eldin Bonsell. 's care.  I will him closely with you while hospitalized and after discharge in  the outpatient department.   Lequita Asal, MD  07/08/2019, 9:32 PM

## 2019-07-08 NOTE — Progress Notes (Signed)
PT Cancellation Note  Patient Details Name: Jeremiah [name]. MRN: [id number] DOB: 15-Dec-1940   Cancelled Treatment:    Reason Eval/Treat Not Completed: Fatigue/lethargy limiting ability to participate   Attempted x 2 this pm while on unit.  Pt sound asleep in bed and did not awaken either time to light touch or voice. Will continue as appropriate.   Chesley Noon 07/08/2019, 3:37 PM

## 2019-07-08 NOTE — Consult Note (Signed)
NAME: Jeremiah [name].  DOB: Sep 18, 1940  MRN: [id number]  Date/Time: 07/08/2019 11:22 AM  REQUESTING PROVIDER: NP ouma Subjective:  REASON FOR CONSULT: FUO ?spoke to his son to get history Jeremiah [name]. is a 79 y.o. male with a history of rheumatoid arthritis, methotrexate induced toxicity recently and a month long hospitalization for it with severe pancytopenia , PEG, was sent to [facility] on 04/26/20.  Brought to the hospital on 07/02/19 as he was confused and shaking. His son noted that he was having a fever and was having pain when he touched him.  Pt was recently in the hospital between 03/31/19 until 04/27/19 Was admitted for fever and was thought to have b/l cellulitis   he has venous edema  . 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 Humberto Seals favored MXT toxicity and if there was MDS it was a mild form. 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 Pt was discharged to [facility] on 04/27/19  He apparently had COVID at [facility] on Dec 22  and his discharge home was delayed by a week. Since he went home in [place] he has been doing okay- not ambulant, started eating by mouth and pEG was removed Feb 8th at [facility]. He did not see his rheumatologist but their office instructed him to start methotrexate and he took his fist dose on 06/22/19 and then next dose was 06/29/19. He presented to the ED on 07/02/19 with fever and generalized joint pain- Pt was off rheumatoid arthritis medication since Dec 2020  and had not been to see his rheumatologist at [facility]  In the ED temp 102.1, HR 112, lactate 1.1 COVID test was positive CXR did not show any infiltrate He got a dose of  azactam, flagyl and vanco on 2/11 Was started on IV remdisivir  As fever persisted  meropenem was started on 2/14 and vanco on 2/15 I am asked to see him for ongoing fever    Past Medical History:  Diagnosis Date  . Allergy   . Arthritis   . Collagen vascular disease ([place])   . Hypertension   . Rheumatoid arthritis ([place])   . Seizures ([place])   . Sepsis ([place]) 05/23/2015    Past Surgical History:  Procedure Laterality Date  . BACK SURGERY    . back surgery five times  04/2016  . CARPAL TUNNEL RELEASE    . HERNIA REPAIR    . JOINT REPLACEMENT    . knee replacement right    . PEG PLACEMENT N/A 04/24/2019   Procedure: PERCUTANEOUS ENDOSCOPIC GASTROSTOMY (PEG) PLACEMENT;  Surgeon: Toledo, Benay Pike, MD;  Location: [facility] ENDOSCOPY;  Service: Gastroenterology;  Laterality: N/A;  . right hip replacement    . rotator cuff surgery    . SPINE SURGERY      Social History   Socioeconomic History  . Marital status: Married    Spouse name: Not on file  . Number of children: Not on file  . Years of education: Not on file  . Highest education level: Not on file  Occupational History  .  Not on file  Tobacco Use  . Smoking status: Former Smoker    Packs/day: 1.00    Years: 20.00    Pack years: 20.00    Types: Cigarettes  . Smokeless tobacco: Never Used  Substance and Sexual Activity  . Alcohol use: No  . Drug use: Never  . Sexual activity: Not on file  Other Topics Concern  . Not on file  Social History Narrative  . Not on file   Social Determinants of Health   Financial Resource Strain:   . Difficulty of Paying Living Expenses: Not on file  Food Insecurity:   . Worried About Charity fundraiser in the Last Year: Not on file  . Ran Out of Food in the Last Year: Not on file  Transportation Needs:   . Lack of  Transportation (Medical): Not on file  . Lack of Transportation (Non-Medical): Not on file  Physical Activity:   . Days of Exercise per Week: Not on file  . Minutes of Exercise per Session: Not on file  Stress:   . Feeling of Stress : Not on file  Social Connections:   . Frequency of Communication with Friends and Family: Not on file  . Frequency of Social Gatherings with Friends and Family: Not on file  . Attends Religious Services: Not on file  . Active Member of Clubs or Organizations: Not on file  . Attends Archivist Meetings: Not on file  . Marital Status: Not on file  Intimate Partner Violence:   . Fear of Current or Ex-Partner: Not on file  . Emotionally Abused: Not on file  . Physically Abused: Not on file  . Sexually Abused: Not on file    Family History  Problem Relation Age of Onset  . Hypertension Other   . Hypertension Mother   . Dementia Mother   . Dementia Father   . Prostate cancer Neg Hx   . Bladder Cancer Neg Hx   . Kidney cancer Neg Hx    Allergies  Allergen Reactions  . Cefoperazone Anaphylaxis  . Penicillins Anaphylaxis and Other (See Comments)    Has patient had a PCN reaction causing immediate rash, facial/tongue/throat swelling, SOB or lightheadedness with hypotension: Yes Has patient had a PCN reaction causing severe rash involving mucus membranes or skin necrosis: No Has patient had a PCN reaction that required hospitalization No Has patient had a PCN reaction occurring within the last 10 years: No If all of the above answers are "NO", then may proceed with Cephalosporin use.  . Latex Rash    ? Current Facility-Administered Medications  Medication Dose Route Frequency Provider Last Rate Last Admin  . 0.9 %  sodium chloride infusion   Intravenous Continuous Lorella Nimrod, MD 100 mL/hr at 07/08/19 0842 New Bag at 07/08/19 0842  . 0.9 %  sodium chloride infusion  250 mL Intravenous Continuous Lorella Nimrod, MD   Stopped at 07/06/19 1123    . acetaminophen (TYLENOL) suppository 650 mg  650 mg Rectal Q4H PRN Lorella Nimrod, MD      . acetaminophen (TYLENOL) tablet 650 mg  650 mg Oral Q6H PRN Lorella Nimrod, MD   650 mg at 07/08/19 0111  . ascorbic acid (VITAMIN C) tablet 500 mg  500 mg Oral Daily Lorella Nimrod, MD   500 mg at 07/08/19 0814  . aspirin chewable tablet 81 mg  81 mg Oral Daily Lorella Nimrod, MD   81 mg at 07/08/19 0815  . Chlorhexidine Gluconate Cloth  2 % PADS 6 each  6 each Topical Daily Ottie Glazier, MD   6 each at 07/07/19 [phone]  . cholecalciferol (VITAMIN D3) tablet 1,000 Units  1,000 Units Oral Daily Lorella Nimrod, MD   1,000 Units at 07/08/19 0815  . ciprofloxacin (CILOXAN) 0.3 % ophthalmic solution 1 drop  1 drop Both Eyes Q4H while awake Loletha Grayer, MD   1 drop at 07/08/19 0953  . enoxaparin (LOVENOX) injection 40 mg  40 mg Subcutaneous Q24H Lorella Nimrod, MD   40 mg at 07/07/19 2212  . famotidine (PEPCID) tablet 20 mg  20 mg Oral BID Lorella Nimrod, MD   20 mg at 07/08/19 0815  . feeding supplement (ENSURE ENLIVE) (ENSURE ENLIVE) liquid 237 mL  237 mL Oral TID BM Lorella Nimrod, MD   237 mL at 26/20/35 5974  . folic acid (FOLVITE) tablet 1 mg  1 mg Oral Daily Lorella Nimrod, MD   1 mg at 07/08/19 0818  . hydroxychloroquine (PLAQUENIL) tablet 200 mg  200 mg Oral Daily Lorella Nimrod, MD   200 mg at 07/08/19 0834  . insulin aspart (novoLOG) injection 0-9 Units  0-9 Units Subcutaneous TID AC & HS Lorella Nimrod, MD   1 Units at 07/07/19 1714  . lamoTRIgine (LAMICTAL) tablet 200 mg  200 mg Oral BID Lorella Nimrod, MD   200 mg at 07/07/19 2213  . lamoTRIgine (LAMICTAL) tablet 25 mg  25 mg Oral BID Lorella Nimrod, MD   25 mg at 07/08/19 0832  . magnesium hydroxide (MILK OF MAGNESIA) suspension 30 mL  30 mL Oral Daily PRN Lorella Nimrod, MD      . magnesium oxide (MAG-OX) tablet 400 mg  400 mg Oral Daily Lorella Nimrod, MD   400 mg at 07/08/19 0815  . MEDLINE mouth rinse  15 mL Mouth Rinse BID Lorella Nimrod, MD   15 mL at  07/08/19 0833  . meropenem (MERREM) 1 g in sodium chloride 0.9 % 100 mL IVPB  1 g Intravenous Q8H Amin, Soundra Pilon, MD 200 mL/hr at 07/08/19 0502 1 g at 07/08/19 0502  . methylPREDNISolone sodium succinate (SOLU-MEDROL) 40 mg/mL injection 40 mg  40 mg Intravenous Daily Loletha Grayer, MD   40 mg at 07/08/19 0953  . metoprolol tartrate (LOPRESSOR) injection 2.5-5 mg  2.5-5 mg Intravenous Q6H PRN Awilda Bill, NP   2.5 mg at 07/07/19 1928  . metoprolol tartrate (LOPRESSOR) tablet 25 mg  25 mg Per Tube BID Lorella Nimrod, MD   25 mg at 07/08/19 0813  . nystatin (MYCOSTATIN) 100000 UNIT/ML suspension 500,000 Units  5 mL Oral QID Flora Lipps, MD   500,000 Units at 07/08/19 0817  . ondansetron (ZOFRAN) tablet 4 mg  4 mg Oral Q6H PRN Lorella Nimrod, MD       Or  . ondansetron (ZOFRAN) injection 4 mg  4 mg Intravenous Q6H PRN Lorella Nimrod, MD      . pantoprazole (PROTONIX) EC tablet 40 mg  40 mg Oral Daily Lorella Nimrod, MD   40 mg at 07/08/19 0814  . polyethylene glycol (MIRALAX / GLYCOLAX) packet 17 g  17 g Oral Daily Lorella Nimrod, MD   17 g at 07/07/19 0916  . thiamine tablet 100 mg  100 mg Oral Daily Lorella Nimrod, MD   100 mg at 07/08/19 0817  . traZODone (DESYREL) tablet 50 mg  50 mg Oral QHS Lorella Nimrod, MD   50 mg at 07/07/19 2212  . vancomycin (VANCOREADY) IVPB 750 mg/150 mL  750  mg Intravenous Q12H Pernell Dupre, RPH   Stopped at 07/08/19 1275  . zinc sulfate capsule 220 mg  220 mg Oral Daily Lorella Nimrod, MD   220 mg at 07/08/19 1700     Abtx:  Anti-infectives (From admission, onward)   Start     Dose/Rate Route Frequency Ordered Stop   07/07/19 0000  vancomycin (VANCOREADY) IVPB 750 mg/150 mL     750 mg 150 mL/hr over 60 Minutes Intravenous Every 12 hours 07/06/19 1040     07/06/19 1100  vancomycin (VANCOREADY) IVPB 2000 mg/400 mL     2,000 mg 200 mL/hr over 120 Minutes Intravenous  Once 07/06/19 1020 07/06/19 2204   07/05/19 2030  meropenem (MERREM) 1 g in sodium chloride  0.9 % 100 mL IVPB     1 g 200 mL/hr over 30 Minutes Intravenous Every 8 hours 07/05/19 2019     07/03/19 1000  remdesivir 100 mg in sodium chloride 0.9 % 100 mL IVPB     100 mg 200 mL/hr over 30 Minutes Intravenous Daily 07/02/19 2023 07/06/19 2207   07/03/19 1000  hydroxychloroquine (PLAQUENIL) tablet 200 mg     200 mg Oral Daily 07/03/19 0250     07/02/19 2100  remdesivir 200 mg in sodium chloride 0.9% 250 mL IVPB     200 mg 580 mL/hr over 30 Minutes Intravenous Once 07/02/19 2023 07/02/19 2328   07/02/19 1630  aztreonam (AZACTAM) 2 g in sodium chloride 0.9 % 100 mL IVPB     2 g 200 mL/hr over 30 Minutes Intravenous  Once 07/02/19 1627 07/02/19 1755   07/02/19 1630  metroNIDAZOLE (FLAGYL) IVPB 500 mg     500 mg 100 mL/hr over 60 Minutes Intravenous  Once 07/02/19 1627 07/02/19 1917   07/02/19 1630  vancomycin (VANCOCIN) IVPB 1000 mg/200 mL premix     1,000 mg 200 mL/hr over 60 Minutes Intravenous  Once 07/02/19 1627 07/02/19 1917      REVIEW OF SYSTEMS:  Const: fever, negative chills, negative weight loss Eyes: negative diplopia or visual changes, negative eye pain ENT: negative coryza, negative sore throat Resp: negative cough, hemoptysis, dyspnea Cards: negative for chest pain, palpitations, lower extremity edema GU: negative for frequency, dysuria and hematuria GI: Negative for abdominal pain, diarrhea, bleeding, constipation Skin: negative for rash and pruritus Heme: negative for easy bruising and gum/nose bleeding MS: severe joint pain and swelling Neurolo:negative for headaches, dizziness, vertigo,  Some memory problems - confusion as per his family Psych: negative for feelings of anxiety, depression  Endocrine: negative for thyroid, diabetes Allergy/Immunology- negative for any medication or food allergies ? Pertinent Positives include : Objective:  VITALS:  BP 107/70   Pulse (!) 113   Temp 98.2 F (36.8 C) (Oral)   Resp 16   Ht '6\' 1"'$  (1.854 m)   Wt 106.4 kg    SpO2 95%   BMI 30.95 kg/m  PHYSICAL EXAM:  General:was sleeping on waking him he became chatyy Says he has a lot of pain.  Head: Normocephalic, without obvious abnormality, atraumatic. Eyes: Conjunctivae clear, anicteric sclerae. Pupils are equal ENT Nares normal. No drainage or sinus tenderness. Lips, mucosa, and tongue normal. No Thrush- no sores edentulous Neck:  symmetrical, no adenopathy, thyroid: non tender no carotid bruit and no JVD. Back: did not examine Lungs: CB/l air entry Heart: s1s2 tachycardia Abdomen: Soft, peg site closed Extremities: swelling of wrist b/l MCP Elbows Tenderness on moving Skin: limited examination Lymph: Cervical, supraclavicular normal. Neurologic: limited  examination- grossly non nonfocal  Foley catheter Pertinent Labs Lab Results CBC    Component Value Date/Time   WBC 11.1 (H) 07/07/2019 2251   RBC 4.10 (L) 07/07/2019 2251   HGB 11.1 (L) 07/07/2019 2251   HCT 34.5 (L) 07/07/2019 2251   PLT 289 07/07/2019 2251   MCV 84.1 07/07/2019 2251   MCH 27.1 07/07/2019 2251   MCHC 32.2 07/07/2019 2251   RDW 15.3 07/07/2019 2251   LYMPHSABS 1.1 07/07/2019 2251   MONOABS 1.8 (H) 07/07/2019 2251   EOSABS 0.0 07/07/2019 2251   BASOSABS 0.0 07/07/2019 2251    CMP Latest Ref Rng & Units 07/07/2019 07/07/2019 07/06/2019  Glucose 70 - 99 mg/dL 152(H) 89 98  BUN 8 - 23 mg/dL 26(H) 21 19  Creatinine 0.61 - 1.24 mg/dL 0.93 0.95 0.90  Sodium 135 - 145 mmol/L 142 141 135  Potassium 3.5 - 5.1 mmol/L 3.2(L) 3.3(L) 3.1(L)  Chloride 98 - 111 mmol/L 111 105 100  CO2 22 - 32 mmol/L '24 23 25  '$ Calcium 8.9 - 10.3 mg/dL 8.1(L) 8.4(L) 8.3(L)  Total Protein 6.5 - 8.1 g/dL - 6.4(L) 6.8  Total Bilirubin 0.3 - 1.2 mg/dL - 1.0 0.8  Alkaline Phos 38 - 126 U/L - 96 92  AST 15 - 41 U/L - 15 13(L)  ALT 0 - 44 U/L - 10 9      Microbiology: Recent Results (from the past 240 hour(s))  Blood Culture (routine x 2)     Status: None   Collection Time: 07/02/19  4:27  PM   Specimen: BLOOD  Result Value Ref Range Status   Specimen Description BLOOD Blood Culture adequate volume  Final   Special Requests   Final    BOTTLES DRAWN AEROBIC AND ANAEROBIC LEFT ANTECUBITAL   Culture   Final    NO GROWTH 5 DAYS Performed at [facility], [address] [place], [place] [address]    Report Status 07/07/2019 FINAL  Final  Urine culture     Status: None   Collection Time: 07/02/19  5:16 PM   Specimen: Urine, Random  Result Value Ref Range Status   Specimen Description   Final    URINE, RANDOM Performed at [facility], [address], [place], [place] [address]    Special Requests   Final    NONE Performed at [facility], [address] [place], [place] [address]    Culture   Final    NO GROWTH Performed at [facility], [place] [address], [place],  [address]    Report Status 07/04/2019 FINAL  Final  Respiratory Panel by RT PCR (Flu A&B, Covid) - Nasopharyngeal Swab     Status: Abnormal   Collection Time: 07/02/19  6:35 PM   Specimen: Nasopharyngeal Swab  Result Value Ref Range Status   SARS Coronavirus 2 by RT PCR POSITIVE (A) NEGATIVE Final    Comment: RESULT CALLED TO, READ BACK BY AND VERIFIED WITH: ANDREA BRYANT 07/02/19 1940 KLW (NOTE) SARS-CoV-2 target nucleic acids are DETECTED. SARS-CoV-2 RNA is generally detectable in upper respiratory specimens  during the acute phase of infection. Positive results are indicative of the presence of the identified virus, but do not rule out bacterial infection or co-infection with other pathogens not detected by the test. Clinical correlation with patient history and other diagnostic information is necessary to determine patient infection status. The expected result is Negative. Fact Sheet for Patients:  PinkCheek.be Fact Sheet for Healthcare Providers: GravelBags.it  This test is not yet approved or  cleared by the [place] and  has been authorized for detection and/or diagnosis of SARS-CoV-2 by FDA under an Emergency Use Authorization (EUA).  This EUA will remain in effect (meaning this test can be used) for th e duration of  the COVID-19 declaration under Section 564(b)(1) of the Act, 21 U.S.C. section 360bbb-3(b)(1), unless the authorization is terminated or revoked sooner.    Influenza A by PCR NEGATIVE NEGATIVE Final   Influenza B by PCR NEGATIVE NEGATIVE Final    Comment: (NOTE) The Xpert Xpress SARS-CoV-2/FLU/RSV assay is intended as an aid in  the diagnosis of influenza from Nasopharyngeal swab specimens and  should not be used as a sole basis for treatment. Nasal washings and  aspirates are unacceptable for Xpert Xpress SARS-CoV-2/FLU/RSV  testing. Fact Sheet for Patients: PinkCheek.be Fact Sheet for Healthcare Providers: GravelBags.it This test is not yet approved or cleared by the [place] FDA and  has been authorized for detection and/or diagnosis of SARS-CoV-2 by  FDA under an Emergency Use Authorization (EUA). This EUA will remain  in effect (meaning this test can be used) for the duration of the  Covid-19 declaration under Section 564(b)(1) of the Act, 21  U.S.C. section 360bbb-3(b)(1), unless the authorization is  terminated or revoked. Performed at [facility], [address] [place], [place] [address]   CULTURE, BLOOD (ROUTINE X 2) w Reflex to ID Panel     Status: None (Preliminary result)   Collection Time: 07/05/19  7:42 PM   Specimen: BLOOD  Result Value Ref Range Status   Specimen Description BLOOD LEFT ANTECUBITAL  Final   Special Requests   Final    BOTTLES DRAWN AEROBIC ONLY Blood Culture adequate volume   Culture   Final    NO GROWTH 3 DAYS Performed at [facility], [address] [place], [place] [address]    Report Status PENDING  Incomplete    CULTURE, BLOOD (ROUTINE X 2) w Reflex to ID Panel     Status: None (Preliminary result)   Collection Time: 07/05/19  7:45 PM   Specimen: BLOOD  Result Value Ref Range Status   Specimen Description BLOOD LEFT HAND  Final   Special Requests   Final    BOTTLES DRAWN AEROBIC ONLY Blood Culture adequate volume   Culture   Final    NO GROWTH 3 DAYS Performed at [facility], [address] [place], [place] [address]    Report Status PENDING  Incomplete  MRSA PCR Screening     Status: None   Collection Time: 07/05/19  7:55 PM   Specimen: Nasal Mucosa; Nasopharyngeal  Result Value Ref Range Status   MRSA by PCR NEGATIVE NEGATIVE Final    Comment:        The GeneXpert MRSA Assay (FDA approved for NASAL specimens only), is one component of a comprehensive MRSA colonization surveillance program. It is not intended to diagnose MRSA infection nor to guide or monitor treatment for MRSA infections. Performed at [facility], [address] [place], [place] [address]   Urine Culture     Status: Abnormal   Collection Time: 07/06/19  8:22 AM   Specimen: Urine, Random  Result Value Ref Range Status   Specimen Description   Final    URINE, RANDOM Performed at [facility], [address] [place], [place] [address]    Special Requests   Final    NONE Performed at  [facility], [address], [place], [place] [address]    Culture MULTIPLE SPECIES PRESENT, SUGGEST RECOLLECTION (A)  Final   Report Status 07/07/2019 FINAL  Final    IMAGING RESULTS:  I have personally reviewed the films ? Impression/Recommendation ? 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 ___________________________________________________ Discussed with his son and primary team

## 2019-07-08 NOTE — Progress Notes (Signed)
Notified E. Ouma, NP of temp 102.32F even after Tylenol and again updated of red MEWS scoring.

## 2019-07-08 NOTE — TOC Progression Note (Signed)
Transition of Care (TOC) - Progression Note    Patient Details  Name: Jeremiah [name]. MRN: [id number] Date of Birth: Feb 17, 1941  Transition of Care [facility]) CM/SW Contact  Shelbie Hutching, RN Phone Number: 07/08/2019, 2:44 PM  Clinical Narrative:    RNCM followed up with patient family regarding skilled nursing rehab.  Patient's son reports that the patient was diagnosed with COVID back in January or December at [facility].  Patient is not currently having COVID symptoms, family requests that bed request be sent to [facility].  RNCM will cont to follow patient and assist with discharge.     Expected Discharge Plan: [place] Barriers to Discharge: Continued Medical Work up  Expected Discharge Plan and Services Expected Discharge Plan: [place]   Discharge Planning Services: CM Consult Post Acute Care Choice: [place] Living arrangements for the past 2 months: Single Family Home                                       Social Determinants of Health (SDOH) Interventions    Readmission Risk Interventions No flowsheet data found.

## 2019-07-09 DIAGNOSIS — D469 Myelodysplastic syndrome, unspecified: Secondary | ICD-10-CM

## 2019-07-09 DIAGNOSIS — D72820 Lymphocytosis (symptomatic): Secondary | ICD-10-CM

## 2019-07-09 DIAGNOSIS — M25532 Pain in left wrist: Secondary | ICD-10-CM

## 2019-07-09 LAB — GLUCOSE, CAPILLARY
Glucose-Capillary: 118 mg/dL — ABNORMAL HIGH (ref 70–99)
Glucose-Capillary: 125 mg/dL — ABNORMAL HIGH (ref 70–99)
Glucose-Capillary: 162 mg/dL — ABNORMAL HIGH (ref 70–99)
Glucose-Capillary: 143 mg/dL — ABNORMAL HIGH (ref 70–99)
Glucose-Capillary: 149 mg/dL — ABNORMAL HIGH (ref 70–99)
Glucose-Capillary: 140 mg/dL — ABNORMAL HIGH (ref 70–99)

## 2019-07-09 LAB — CREATININE, SERUM
Creatinine, Ser: 0.77 mg/dL (ref 0.61–1.24)
GFR calc Af Amer: >60 mL/min (ref >60)
GFR calc non Af Amer: >60 mL/min (ref >60)

## 2019-07-09 LAB — SEDIMENTATION RATE: Sed Rate: 68 mm/hr — ABNORMAL HIGH (ref 0–20)

## 2019-07-09 NOTE — Progress Notes (Signed)
Morning medications administered per orders. Pt. Sitting up in bed, complete4d 25% of breakfast. No needs noted at this time.

## 2019-07-09 NOTE — TOC Progression Note (Signed)
Transition of Care (TOC) - Progression Note    Patient Details  Name: Jeremiah [name]. MRN: [id number] Date of Birth: April 23, 1941  Transition of Care [facility]) CM/SW Contact  Shelbie Hutching, RN Phone Number: 07/09/2019, 1:06 PM  Clinical Narrative:    RNCM spoke with patient wife this morning, she is going to review the bed offers and make her decision, family was hoping for [facility] but [facility] has declined to offer a bed.  RNCM followed up with wife now and she has still not had a chance to look at the facilities, she reports she will do that now and call back RNCM with decision.    Expected Discharge Plan: [place] Barriers to Discharge: Continued Medical Work up  Expected Discharge Plan and Services Expected Discharge Plan: [place]   Discharge Planning Services: CM Consult Post Acute Care Choice: [place] Living arrangements for the past 2 months: Single Family Home                                       Social Determinants of Health (SDOH) Interventions    Readmission Risk Interventions No flowsheet data found.

## 2019-07-09 NOTE — Progress Notes (Signed)
Patient ID: Jeremiah [name]., male   DOB: 1940/12/05, 79 y.o.   MRN: [id number] [facility] PROGRESS NOTE  Jeremiah [name]. [id number] DOB: 05/10/1941 DOA: 07/02/2019 PCP: Derinda Late, MD  HPI/Subjective: Patient was up and alert.  He is slept well last night.  He complains of some left wrist and hand pain.  And states his appetite is good.  He is feeling better.  No further fever.  Objective: Vitals:   07/09/19 0744 07/09/19 0800  BP: 125/61 123/76  Pulse: 82 83  Resp: 17 15  Temp:    SpO2: 98% 100%    Intake/Output Summary (Last 24 hours) at 07/09/2019 1250 Last data filed at 07/09/2019 0900 Gross per 24 hour  Intake 835 ml  Output 600 ml  Net 235 ml   Filed Weights   07/02/19 1613 07/08/19 0500  Weight: 104.3 kg 106.4 kg    ROS: Review of Systems  Constitutional: Negative for chills and fever.  Eyes: Negative for blurred vision.  Respiratory: Negative for cough and shortness of breath.   Cardiovascular: Negative for chest pain.  Gastrointestinal: Negative for abdominal pain, constipation, diarrhea, nausea and vomiting.  Genitourinary: Negative for dysuria.  Musculoskeletal: Positive for joint pain.  Neurological: Negative for dizziness and headaches.   Exam: Physical Exam  HENT:  Nose: No mucosal edema.  Mouth/Throat: No oropharyngeal exudate or posterior oropharyngeal edema.  Eyes: Pupils are equal, round, and reactive to light. Conjunctivae, EOM and lids are normal.  Neck: No JVD present. Carotid bruit is not present. No thyroid mass and no thyromegaly present.  Cardiovascular: S1 normal and S2 normal. Exam reveals no gallop.  No murmur heard. Respiratory: No respiratory distress. He has decreased breath sounds in the right lower field and the left lower field. He has no wheezes. He has no rhonchi. He has no rales.  GI: Soft. Bowel sounds are normal. There is no abdominal tenderness.  Musculoskeletal:     Cervical back: No edema.     Right  ankle: Swelling present.     Left ankle: Swelling present.     Comments: Left wrist and hand painful range of motion and swollen  Lymphadenopathy:    He has no cervical adenopathy.  Neurological: He is alert. No cranial nerve deficit.  Barely able to straight leg raise with the left leg.  Able to straight leg raise better with the right leg.  Skin: Skin is warm. Nails show no clubbing.  Chronic lower extremity skin discoloration  Psychiatric: He has a normal mood and affect.      Data Reviewed: Basic Metabolic Panel: Recent Labs  Lab 07/04/19 0548 07/04/19 0548 07/05/19 0552 07/06/19 0349 07/07/19 0421 07/07/19 2251 07/09/19 0415  NA 136  --  137 135 141 142  --   K 3.1*  --  3.3* 3.1* 3.3* 3.2*  --   CL 103  --  103 100 105 111  --   CO2 25  --  26 25 23 24   --   GLUCOSE 103*  --  99 98 89 152*  --   BUN 17  --  18 19 21  26*  --   CREATININE 0.79   < > 0.86 0.90 0.95 0.93 0.77  CALCIUM 8.5*  --  8.7* 8.3* 8.4* 8.1*  --   MG 1.9  --  2.0  --  2.1  --   --    < > = values in this interval not displayed.   Liver Function  Tests: Recent Labs  Lab 07/03/19 0742 07/04/19 0548 07/05/19 0552 07/06/19 0349 07/07/19 0421  AST 13* 11* 11* 13* 15  ALT 10 8 9 9 10   ALKPHOS 102 86 92 92 96  BILITOT 0.6 1.0 0.8 0.8 1.0  PROT 7.0 6.4* 6.7 6.8 6.4*  ALBUMIN 2.9* 2.6* 2.6* 2.5* 2.4*   Recent Labs  Lab 07/02/19 1627  LIPASE 25   CBC: Recent Labs  Lab 07/04/19 0548 07/05/19 0552 07/06/19 0349 07/07/19 0421 07/07/19 2251  WBC 7.1 6.9 11.6* 13.2* 11.1*  NEUTROABS 5.0 4.5 8.7* 10.0* 8.2*  HGB 10.4* 11.3* 11.9* 11.8* 11.1*  HCT 32.9* 34.8* 35.9* 36.5* 34.5*  MCV 85.2 84.1 83.9 84.9 84.1  PLT 220 228 269 281 289   BNP (last 3 results) Recent Labs    04/12/19 0504  BNP 27.0     CBG: Recent Labs  Lab 07/08/19 2051 07/08/19 2358 07/09/19 0502 07/09/19 0746 07/09/19 1107  GLUCAP 163* 191* 118* 125* 162*    Recent Results (from the past 240 hour(s))  Blood  Culture (routine x 2)     Status: None   Collection Time: 07/02/19  4:27 PM   Specimen: BLOOD  Result Value Ref Range Status   Specimen Description BLOOD Blood Culture adequate volume  Final   Special Requests   Final    BOTTLES DRAWN AEROBIC AND ANAEROBIC LEFT ANTECUBITAL   Culture   Final    NO GROWTH 5 DAYS Performed at [facility], [address], [place], [place] [address]    Report Status 07/07/2019 FINAL  Final  Urine culture     Status: None   Collection Time: 07/02/19  5:16 PM   Specimen: Urine, Random  Result Value Ref Range Status   Specimen Description   Final    URINE, RANDOM Performed at [facility], [address] [place], [place] [address]    Special Requests   Final    NONE Performed at [facility], [address] [place], [place] [address]    Culture   Final    NO GROWTH Performed at [facility], [place] [address] [place], [place] [address]    Report Status 07/04/2019 FINAL  Final  Respiratory Panel by RT PCR (Flu A&B, Covid) - Nasopharyngeal Swab     Status: Abnormal   Collection Time: 07/02/19  6:35 PM   Specimen: Nasopharyngeal Swab  Result Value Ref Range Status   SARS Coronavirus 2 by RT PCR POSITIVE (A) NEGATIVE Final    Comment: RESULT CALLED TO, READ BACK BY AND VERIFIED WITH: ANDREA BRYANT 07/02/19 1940 KLW (NOTE) SARS-CoV-2 target nucleic acids are DETECTED. SARS-CoV-2 RNA is generally detectable in upper respiratory specimens  during the acute phase of infection. Positive results are indicative of the presence of the identified virus, but do not rule out bacterial infection or co-infection with other pathogens not detected by the test. Clinical correlation with patient history and other diagnostic information is necessary to determine patient infection status. The expected result is Negative. Fact Sheet for Patients:  PinkCheek.be Fact Sheet for Healthcare  Providers: GravelBags.it This test is not yet approved or cleared by the [place] FDA and  has been authorized for detection and/or diagnosis of SARS-CoV-2 by FDA under an Emergency Use Authorization (EUA).  This EUA will remain in effect (meaning this test can be used) for th e duration of  the COVID-19 declaration under Section 564(b)(1) of the Act, 21 U.S.C. section 360bbb-3(b)(1), unless the  authorization is terminated or revoked sooner.    Influenza A by PCR NEGATIVE NEGATIVE Final   Influenza B by PCR NEGATIVE NEGATIVE Final    Comment: (NOTE) The Xpert Xpress SARS-CoV-2/FLU/RSV assay is intended as an aid in  the diagnosis of influenza from Nasopharyngeal swab specimens and  should not be used as a sole basis for treatment. Nasal washings and  aspirates are unacceptable for Xpert Xpress SARS-CoV-2/FLU/RSV  testing. Fact Sheet for Patients: PinkCheek.be Fact Sheet for Healthcare Providers: GravelBags.it This test is not yet approved or cleared by the [place] FDA and  has been authorized for detection and/or diagnosis of SARS-CoV-2 by  FDA under an Emergency Use Authorization (EUA). This EUA will remain  in effect (meaning this test can be used) for the duration of the  Covid-19 declaration under Section 564(b)(1) of the Act, 21  U.S.C. section 360bbb-3(b)(1), unless the authorization is  terminated or revoked. Performed at [facility], [address] [place], [place] [address]   CULTURE, BLOOD (ROUTINE X 2) w Reflex to ID Panel     Status: None (Preliminary result)   Collection Time: 07/05/19  7:42 PM   Specimen: BLOOD  Result Value Ref Range Status   Specimen Description BLOOD LEFT ANTECUBITAL  Final   Special Requests   Final    BOTTLES DRAWN AEROBIC ONLY Blood Culture adequate volume   Culture   Final    NO GROWTH 4 DAYS Performed at [facility], [address], [place], [place] [address]    Report Status PENDING  Incomplete  CULTURE, BLOOD (ROUTINE X 2) w Reflex to ID Panel     Status: None (Preliminary result)   Collection Time: 07/05/19  7:45 PM   Specimen: BLOOD  Result Value Ref Range Status   Specimen Description BLOOD LEFT HAND  Final   Special Requests   Final    BOTTLES DRAWN AEROBIC ONLY Blood Culture adequate volume   Culture   Final    NO GROWTH 4 DAYS Performed at [facility], [address], [place], [place] [address]    Report Status PENDING  Incomplete  MRSA PCR Screening     Status: None   Collection Time: 07/05/19  7:55 PM   Specimen: Nasal Mucosa; Nasopharyngeal  Result Value Ref Range Status   MRSA by PCR NEGATIVE NEGATIVE Final    Comment:        The GeneXpert MRSA Assay (FDA approved for NASAL specimens only), is one component of a comprehensive MRSA colonization surveillance program. It is not intended to diagnose MRSA infection nor to guide or monitor treatment for MRSA infections. Performed at [facility], [address] [place], [place] [address]   Urine Culture     Status: Abnormal   Collection Time: 07/06/19  8:22 AM   Specimen: Urine, Random  Result Value Ref Range Status   Specimen Description   Final    URINE, RANDOM Performed at [facility], [address], [place], [place] [address]    Special Requests   Final    NONE Performed at [facility], [address] [place], [place] [address]    Culture MULTIPLE SPECIES PRESENT, SUGGEST RECOLLECTION (A)  Final   Report Status 07/07/2019 FINAL  Final      Scheduled Meds: . vitamin C  500 mg Oral Daily  . aspirin  81 mg Oral Daily  . Chlorhexidine Gluconate Cloth  6 each Topical Daily  . cholecalciferol  1,000  Units Oral Daily  . ciprofloxacin  1 drop Both Eyes Q4H while awake  . enoxaparin (LOVENOX) injection  40 mg Subcutaneous Q24H  . famotidine  20 mg Oral BID  . feeding supplement  (ENSURE ENLIVE)  237 mL Oral TID BM  . folic acid  1 mg Intravenous Daily  . hydroxychloroquine  200 mg Oral Daily  . insulin aspart  0-9 Units Subcutaneous TID AC & HS  . lamoTRIgine  200 mg Oral BID  . lamoTRIgine  25 mg Oral BID  . magnesium oxide  400 mg Oral Daily  . mouth rinse  15 mL Mouth Rinse BID  . methylPREDNISolone (SOLU-MEDROL) injection  40 mg Intravenous Daily  . metoprolol tartrate  25 mg Per Tube BID  . nystatin  5 mL Oral QID  . pantoprazole  40 mg Oral Daily  . polyethylene glycol  17 g Oral Daily  . thiamine  100 mg Oral Daily  . traZODone  50 mg Oral QHS  . zinc sulfate  220 mg Oral Daily   Continuous Infusions: . sodium chloride 50 mL/hr at 07/08/19 2308  . sodium chloride Stopped (07/06/19 1123)    Assessment/Plan:  1. Rheumatoid arthritis flare with left wrist and hand pain and swelling.  Fever could be secondary to this.  Continue steroids.  Could have also had a reaction to the methotrexate.  No further methotrexate.  Continue IV folic acid for now.  Reassess tomorrow to see how his left wrist and hand are doing. 2. COVID-19 infection.  Completed a course of remdesivir.  Patient had Covid 19 infection diagnosed in December or January.  Our swab here was positive but likely continued positivity from prior infection.  Infectious disease recommends discontinuing isolation. 3. Acute metabolic encephalopathy.  This has improved 4. Seizure disorder on Lamictal 5. Essential hypertension.  Holding Hyzaar and Lopressor secondary to hypotension.  Blood pressure and heart rate better off medications. 6. Hypokalemia replacing potassium 7. Weakness physical therapy recommends rehab. 8. 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  Code Status:     Code Status Orders  (From admission, onward)         Start     Ordered   07/02/19 2017  Full code  Continuous     07/02/19 2023        Code Status History    Date Active Date Inactive Code Status Order  ID Comments User Context   04/22/2019 1038 04/27/2019 2053 Full Code [id number]  [name], MD Inpatient   04/21/2019 1313 04/22/2019 1038 DNR [id number]  Jimmy Footman, NP Inpatient   03/31/2019 1819 04/21/2019 1313 Full Code [id number]  Lavina Hamman, MD Inpatient   02/20/2018 2220 02/24/2018 2019 Full Code [id number]  Lance Coon, MD ED   05/23/2015 2221 05/26/2015 1741 Full Code [id number]  Hower, Aaron Mose, MD ED   Advance Care Planning Activity     Family Communication: spoke with daughter-in-law on the phone Disposition Plan: Transitional care team looking into rehabs.  Hopefully will be stable for rehab tomorrow.  Would like to see if his wrist and hand pain and swelling go down prior to making a disposition.  Consultants:  Infectious disease  Antibiotics:  Discontinued  Time spent: 46 minutes  [place] [facility]

## 2019-07-09 NOTE — Care Management Important Message (Signed)
Important Message  Patient Details  Name: Jeremiah [name]. MRN: [id number] Date of Birth: December 30, 1940   Medicare Important Message Given:  Yes  IM reviewed with patient's wife via phone.  Patient is on the COVID isolation unit.    Shelbie Hutching, RN 07/09/2019, 10:02 AM

## 2019-07-09 NOTE — TOC Progression Note (Signed)
Transition of Care (TOC) - Progression Note    Patient Details  Name: [place] Cuvelier. MRN: [id number] Date of Birth: 02-Jul-1940  Transition of Care [facility]) CM/SW Contact  Shelbie Hutching, RN Phone Number: 07/09/2019, 4:31 PM  Clinical Narrative:    Patient's son, Corene Cornea, and the patient's wife have decided on [facility] in [place] for short term rehab.  [employer] auth still pending.    Expected Discharge Plan: [place] Barriers to Discharge: Continued Medical Work up  Expected Discharge Plan and Services Expected Discharge Plan: [place]   Discharge Planning Services: CM Consult Post Acute Care Choice: [place] Living arrangements for the past 2 months: Single Family Home                                       Social Determinants of Health (SDOH) Interventions    Readmission Risk Interventions No flowsheet data found.

## 2019-07-09 NOTE — TOC Progression Note (Signed)
Transition of Care (TOC) - Progression Note    Patient Details  Name: Jeremiah [name]. MRN: [id number] Date of Birth: 03/03/1941  Transition of Care [facility]) CM/SW Contact  Shelbie Hutching, RN Phone Number: 07/09/2019, 2:36 PM  Clinical Narrative:    Patient's wife has not chosen a facility yet, RNCM started insurance authorization with [facility], reference # [id number].    Expected Discharge Plan: [place] Barriers to Discharge: Continued Medical Work up  Expected Discharge Plan and Services Expected Discharge Plan: [place]   Discharge Planning Services: CM Consult Post Acute Care Choice: [place] Living arrangements for the past 2 months: Single Family Home                                       Social Determinants of Health (SDOH) Interventions    Readmission Risk Interventions No flowsheet data found.

## 2019-07-09 NOTE — Progress Notes (Signed)
Physical Therapy Treatment Patient Details Name: Jeremiah [name]. MRN: [id number] DOB: 1941-04-16 Today's Date: 07/09/2019    History of Present Illness  79 y.o. male with Past medical history of rheumatoid arthritis on immunosuppressive therapy, seizures, HTN, chronic lymphedema. Pt was here 2 months ago with LE edema/cellulitis, went to rehab and apparently had recoverd to near baseline (transfers, standing for self care with minimal family assist).  He now returns with weakness/decreased function and was found to be Covid +    PT Comments    Pt alert, very pleasant agreeable to work with PT/OT. Pt easily distractable, needed redirection to attend to task throughout session. Pt performed rolling maxAx2 due address BM. Supine <> sit with maxAx2, pt able to initiate movement but needed significant assistance to complete mobility. The patient sat EOB with varying levels of assistance; 3-4 instances of close supervision with R lateral lean noted, able to correct with visual cues, but predominantly need min-modA to maintain balance especially with dual tasking. The patient was returned to supine, all needs in reach. The patient would benefit from further skilled PT intervention to maximize safety, mobility, and independence, current recommendation remains appropriate.     Follow Up Recommendations  SNF;Supervision/Assistance - 24 hour     Equipment Recommendations  None recommended by PT    Recommendations for Other Services       Precautions / Restrictions Precautions Precautions: Fall Restrictions Weight Bearing Restrictions: No    Mobility  Bed Mobility Overal bed mobility: Needs Assistance Bed Mobility: Rolling;Supine to Sit;Sit to Supine Rolling: Max assist;+2 for physical assistance   Supine to sit: Max assist;+2 for physical assistance Sit to supine: Max assist;+2 for physical assistance   General bed mobility comments: Pt with improved tolerance to mobility this session.  Verbalized that he did not want to stand, sat EOB for 8-42minutes  Transfers                    Ambulation/Gait                 Stairs             Wheelchair Mobility    Modified Rankin (Stroke Patients Only)       Balance Overall balance assessment: Needs assistance Sitting-balance support: Single extremity supported;Feet supported Sitting balance-Leahy Scale: Fair Sitting balance - Comments: Pt able to sit at EOB for ~8-10 min with variable assist this date. Significant R lateral/posterior lean noted, however pt able to correct with multimodal cueing. Pt requires moderate +2 to supervision level assist for seated balance. He generally maintains BUE support, but is able to use his RUE for fxl task while seated EOB.                                    Cognition Arousal/Alertness: Awake/alert Behavior During Therapy: WFL for tasks assessed/performed Overall Cognitive Status: History of cognitive impairments - at baseline                                 General Comments: Pt is pleasant and talkative t/o session. He is generally able to follow VCs given min increased time for processing and cueing PRN for re-direction to task.      Exercises General Exercises - Lower Extremity Ankle Circles/Pumps: AROM;Both;10 reps Heel Slides: AAROM;Strengthening;Both;10 reps Hip ABduction/ADduction: AAROM;Strengthening;Both;10 reps  Other Exercises Other Exercises: OT/PT engage pt in rolling right and left in bed for bed level peri-care this date. maxaX2 and heavy use of grab bars Other Exercises: OT/PT engage pt in bed mobility to come to sitting EOB for improved activity tolerance and seated balance this date. Pt sits EOB for ~10 minutes with variable levels of assist (see above for detail)    General Comments General comments (skin integrity, edema, etc.): Pt on RA t/o session O2 sats remain WFLs >95%/      Pertinent Vitals/Pain Pain  Assessment: Faces Faces Pain Scale: Hurts a little bit Pain Location: Pt noted to yell during bed mobility, but does not localize pain this date. Pain monitored t/o session. Pain Descriptors / Indicators: Sore;Grimacing;Guarding Pain Intervention(s): Repositioned;Limited activity within patient's tolerance;Monitored during session    Home Living                      Prior Function            PT Goals (current goals can now be found in the care plan section) Acute Rehab PT Goals Patient Stated Goal: get strong enough to go back home Progress towards PT goals: Progressing toward goals    Frequency    Min 2X/week      PT Plan Current plan remains appropriate    Co-evaluation PT/OT/SLP Co-Evaluation/Treatment: Yes Reason for Co-Treatment: Complexity of the patient's impairments (multi-system involvement);For patient/therapist safety;To address functional/ADL transfers;Necessary to address cognition/behavior during functional activity PT goals addressed during session: Mobility/safety with mobility;Balance;Strengthening/ROM OT goals addressed during session: ADL's and self-care;Strengthening/ROM      AM-PAC PT "6 Clicks" Mobility   Outcome Measure  Help needed turning from your back to your side while in a flat bed without using bedrails?: Total Help needed moving from lying on your back to sitting on the side of a flat bed without using bedrails?: Total Help needed moving to and from a bed to a chair (including a wheelchair)?: Total Help needed standing up from a chair using your arms (e.g., wheelchair or bedside chair)?: Total Help needed to walk in hospital room?: Total Help needed climbing 3-5 steps with a railing? : Total 6 Click Score: 6    End of Session   Activity Tolerance: Patient limited by pain;Patient limited by fatigue Patient left: with bed alarm set;with call bell/phone within reach;in bed Nurse Communication: Mobility status PT Visit Diagnosis:  Muscle weakness (generalized) (M62.81);Difficulty in walking, not elsewhere classified (R26.2);Pain Pain - Right/Left: Right Pain - part of body: Shoulder     Time: [id number] PT Time Calculation (min) (ACUTE ONLY): 41 min  Charges:  $Therapeutic Exercise: 23-37 mins                     Lieutenant Diego PT, DPT 4:34 PM,07/09/19

## 2019-07-09 NOTE — Progress Notes (Signed)
Occupational Therapy Treatment Patient Details Name: Jeremiah [name]. MRN: [id number] DOB: 1941-05-16 Today's Date: 07/09/2019    History of present illness  79 y.o. male with Past medical history of rheumatoid arthritis on immunosuppressive therapy, seizures, HTN, chronic lymphedema. Pt was here 2 months ago with LE edema/cellulitis, went to rehab and apparently had recoverd to near baseline (transfers, standing for self care with minimal family assist).  He now returns with weakness/decreased function and was found to be Covid +   OT comments  Jeremiah [name] was seen for OT/PT co-treatment on this date. Upon arrival to room pt awake/alert, semi-supine in bed, and agreeable to therapy session. Pt more awake/alert this date. He is pleasant and conversational t/o session, although he remains mildly confused and easily distracted requiring re-direction to task consistently during session. Pt noted to have soiled bed linens and pad with BM. OT/PT engage pt in rolling right and left for bed-level peri-care. Pt requires total assist for hygiene this date. Pt agreeable to to sitting EOB. He requires max Ax2 to come to sitting upright at EOB. He maintains sitting balance given variable assist from therapists this date. He requires at most moderate assist x2 for balance, and at his best during the session is able to sit briefly with supervision/CGA for safety. Pt is able to engage in functional use of a straw cup while seated this date. He demonstrates improved RUE use and is able to take ~3 sips of water w/o need for physical assist to support his RUE this date. Pt LUE continues to be swollen/edemetous, however, pt does not endorse pain with LUE functional use this date. Pt left with LUE propped on pillow to maximize safety, skin integrity, and fluid return toward the heart. RN notified. Pt progressing toward OT goals, and continues to benefit from skilled OT services to maximize return to PLOF and minimize risk of  future falls, injury, caregiver burden, and readmission. Will continue to follow POC. Discharge recommendation remains appropriate.    Follow Up Recommendations  SNF    Equipment Recommendations  Other (comment)(TBD)    Recommendations for Other Services      Precautions / Restrictions Precautions Precautions: Fall Restrictions Weight Bearing Restrictions: No       Mobility Bed Mobility Overal bed mobility: Needs Assistance Bed Mobility: Rolling;Supine to Sit;Sit to Supine Rolling: Max assist;+2 for physical assistance   Supine to sit: Max assist;+2 for physical assistance Sit to supine: Max assist;+2 for physical assistance   General bed mobility comments: Pt with improved tolerance for fxl mobility this date. He is less pain limited than at past sessions and able to come to sitting EOB given max A x2 for mgt of trunk and legs.  Transfers                      Balance Overall balance assessment: Needs assistance Sitting-balance support: Single extremity supported;Feet supported Sitting balance-Leahy Scale: Fair Sitting balance - Comments: Pt able to sit at EOB for ~8-10 min with variable assist this date. Significant R lateral/posterior lean noted, however pt able to correct with multimodal cueing. Pt requires moderate +2 to supervision level assist for seated balance. He generally maintains BUE support, but is able to use his RUE for fxl task while seated EOB.                                   ADL  either performed or assessed with clinical judgement   ADL Overall ADL's : Needs assistance/impaired                                       General ADL Comments: Pt continues to be functionally limited by cardiopulmonary status, pain in BLE/BUE, generalized weakness, and decreased activity tolerance. Requires Max assist for bed level grooming and self feeding. Max A for LB ADL management in seated position. Total Assist for bed level  toileting. Pt able to sit at EOB given mod A to support seated balance, and take several unassisted sips from a straw cup this date. He requires max A x2 for functional bed mobility.     Vision Baseline Vision/History: Wears glasses Wears Glasses: At all times     Perception     Praxis      Cognition Arousal/Alertness: Awake/alert Behavior During Therapy: WFL for tasks assessed/performed Overall Cognitive Status: History of cognitive impairments - at baseline                                 General Comments: Pt is pleasant and talkative t/o session. He is generally able to follow VCs given min increased time for processing and cueing PRN for re-direction to task.        Exercises Other Exercises Other Exercises: OT/PT engage pt in rolling right and left in bed for bed level peri-care this date. Other Exercises: OT/PT engage pt in bed mobility to come to sitting EOB for improved activity tolerance and seated balance this date. Pt sits EOB for ~10 minutes with variable levels of assist (see above for detail)   Shoulder Instructions       General Comments Pt on RA t/o session O2 sats remain WFLs >95%/    Pertinent Vitals/ Pain       Faces Pain Scale: Hurts a little bit Pain Location: Pt noted to yell during bed mobility, but does not localize pain this date. Pain monitored t/o session. Pain Intervention(s): Repositioned;Utilized relaxation techniques  Home Living                                          Prior Functioning/Environment              Frequency  Min 2X/week        Progress Toward Goals  OT Goals(current goals can now be found in the care plan section)  Progress towards OT goals: Progressing toward goals  Acute Rehab OT Goals Patient Stated Goal: get strong enough to go back home OT Goal Formulation: With patient Time For Goal Achievement: 07/18/19 Potential to Achieve Goals: Good  Plan Discharge plan remains  appropriate;Frequency remains appropriate    Co-evaluation    PT/OT/SLP Co-Evaluation/Treatment: Yes Reason for Co-Treatment: Complexity of the patient's impairments (multi-system involvement);For patient/therapist safety;To address functional/ADL transfers;Necessary to address cognition/behavior during functional activity PT goals addressed during session: Mobility/safety with mobility;Balance;Strengthening/ROM OT goals addressed during session: ADL's and self-care;Strengthening/ROM      AM-PAC OT "6 Clicks" Daily Activity     Outcome Measure   Help from another person eating meals?: A Little Help from another person taking care of personal grooming?: A Little Help from another person toileting, which includes using  toliet, bedpan, or urinal?: A Lot Help from another person bathing (including washing, rinsing, drying)?: A Lot Help from another person to put on and taking off regular upper body clothing?: A Little Help from another person to put on and taking off regular lower body clothing?: A Lot 6 Click Score: 15    End of Session    OT Visit Diagnosis: Other abnormalities of gait and mobility (R26.89);Muscle weakness (generalized) (M62.81);Pain Pain - Right/Left: (both) Pain - part of body: Shoulder;Arm;Leg;Knee   Activity Tolerance Patient tolerated treatment well   Patient Left in bed;with call bell/phone within reach;with bed alarm set   Nurse Communication Other (comment);Mobility status(Pt had BM, external catheter found partially removed during session.)        Time: 1335-1419 OT Time Calculation (min): 44 min  Charges: OT General Charges $OT Visit: 1 Visit OT Treatments $Self Care/Home Management : 8-22 mins  Shara Blazing, M.S., OTR/L Ascom: [phone] 07/09/19, 3:55 PM

## 2019-07-09 NOTE — Progress Notes (Signed)
Bedside shift report received from night shift RN. Morning assessment completed. Pt. Denies any current pain or needs. Will continue to monitor.

## 2019-07-10 LAB — GLUCOSE, CAPILLARY: Glucose-Capillary: 94 mg/dL (ref 70–99)

## 2019-07-10 LAB — CULTURE, BLOOD (ROUTINE X 2)
Culture: NO GROWTH
Culture: NO GROWTH
Special Requests: ADEQUATE
Special Requests: ADEQUATE

## 2019-07-10 LAB — CBC
HCT: 31.7 % — ABNORMAL LOW (ref 39.0–52.0)
Hemoglobin: 10 g/dL — ABNORMAL LOW (ref 13.0–17.0)
MCH: 27 pg (ref 26.0–34.0)
MCHC: 31.5 g/dL (ref 30.0–36.0)
MCV: 85.7 fL (ref 80.0–100.0)
Platelets: 372 10*3/uL (ref 150–400)
RBC: 3.7 MIL/uL — ABNORMAL LOW (ref 4.22–5.81)
RDW: 15.2 % (ref 11.5–15.5)
WBC: 7.1 10*3/uL (ref 4.0–10.5)
nRBC: 0 % (ref 0.0–0.2)

## 2019-07-10 LAB — MAGNESIUM: Magnesium: 2.4 mg/dL (ref 1.7–2.4)

## 2019-07-10 LAB — BASIC METABOLIC PANEL
Anion gap: 4 — ABNORMAL LOW (ref 5–15)
BUN: 35 mg/dL — ABNORMAL HIGH (ref 8–23)
CO2: 25 mmol/L (ref 22–32)
Calcium: 8.4 mg/dL — ABNORMAL LOW (ref 8.9–10.3)
Chloride: 114 mmol/L — ABNORMAL HIGH (ref 98–111)
Creatinine, Ser: 0.7 mg/dL (ref 0.61–1.24)
GFR calc Af Amer: >60 mL/min (ref >60)
GFR calc non Af Amer: >60 mL/min (ref >60)
Glucose, Bld: 109 mg/dL — ABNORMAL HIGH (ref 70–99)
Potassium: 3.9 mmol/L (ref 3.5–5.1)
Sodium: 143 mmol/L (ref 135–145)

## 2019-07-10 LAB — RHEUMATOID FACTOR: Rheumatoid fact SerPl-aCnc: 15.8 IU/mL — ABNORMAL HIGH (ref 0.0–13.9)

## 2019-07-10 MED ORDER — VITAMIN D3 25 MCG PO TABS: 1000.0000 [IU] | ORAL_TABLET | Freq: Every day | ORAL | 0 refills | Status: AC

## 2019-07-10 MED ORDER — NYSTATIN 100000 UNIT/ML MT SUSP: 5.0000 mL | Freq: Four times a day (QID) | OROMUCOSAL | 0 refills | Status: DC

## 2019-07-10 MED ORDER — ASCORBIC ACID 500 MG PO TABS: 500.0000 mg | ORAL_TABLET | Freq: Every day | ORAL | 0 refills | Status: DC

## 2019-07-10 MED ORDER — ENSURE ENLIVE PO LIQD: 237.0000 mL | Freq: Three times a day (TID) | ORAL | 0 refills | Status: DC

## 2019-07-10 MED ORDER — PREDNISONE 5 MG PO TABS: ORAL_TABLET | ORAL | 0 refills | Status: DC

## 2019-07-10 MED ORDER — POLYETHYLENE GLYCOL 3350 17 G PO PACK: 17.0000 g | PACK | Freq: Every day | ORAL | 0 refills | Status: AC | PRN

## 2019-07-10 MED ORDER — ZINC SULFATE 220 (50 ZN) MG PO CAPS: 220.0000 mg | ORAL_CAPSULE | Freq: Every day | ORAL | 0 refills | Status: DC

## 2019-07-10 MED ORDER — METOPROLOL TARTRATE 25 MG PO TABS: 25.0000 mg | ORAL_TABLET | Freq: Two times a day (BID) | ORAL | 11 refills | Status: AC

## 2019-07-10 NOTE — Discharge Instructions (Signed)

## 2019-07-10 NOTE — Plan of Care (Signed)
  Problem: Education: Goal: Knowledge of General Education information will improve Description: Including pain rating scale, medication(s)/side effects and non-pharmacologic comfort measures Outcome: Progressing   Problem: Health Behavior/Discharge Planning: Goal: Ability to manage health-related needs will improve Outcome: Progressing   Problem: Clinical Measurements: Goal: Ability to maintain clinical measurements within normal limits will improve Outcome: Progressing Goal: Will remain free from infection Outcome: Progressing Goal: Diagnostic test results will improve Outcome: Progressing Goal: Respiratory complications will improve Outcome: Progressing Goal: Cardiovascular complication will be avoided Outcome: Progressing   Problem: Activity: Goal: Risk for activity intolerance will decrease Outcome: Progressing   Problem: Nutrition: Goal: Adequate nutrition will be maintained Outcome: Progressing   Problem: Coping: Goal: Level of anxiety will decrease Outcome: Progressing   Problem: Elimination: Goal: Will not experience complications related to bowel motility Outcome: Progressing Goal: Will not experience complications related to urinary retention Outcome: Progressing   Problem: Pain Managment: Goal: General experience of comfort will improve Outcome: Progressing   Problem: Safety: Goal: Ability to remain free from injury will improve Outcome: Progressing   Problem: Skin Integrity: Goal: Risk for impaired skin integrity will decrease Outcome: Progressing   Problem: Nutrition Goal: Nutritional status is improving Description: Monitor and assess patient for malnutrition (ex- brittle hair, bruises, dry skin, pale skin and conjunctiva, muscle wasting, smooth red tongue, and disorientation). Collaborate with interdisciplinary team and initiate plan and interventions as ordered.  Monitor patient's weight and dietary intake as ordered or per policy. Utilize  nutrition screening tool and intervene per policy. Determine patient's food preferences and provide high-protein, high-caloric foods as appropriate.  Outcome: Progressing

## 2019-07-10 NOTE — Progress Notes (Addendum)
report called to [facility] at [place]. bm prior to leaving. Ems transported pt

## 2019-07-10 NOTE — TOC Transition Note (Signed)
Transition of Care [facility]) - CM/SW Discharge Note   Patient Details  Name: Jeremiah [name]. MRN: [id number] Date of Birth: April 29, 1941  [facility] of [facility] CM/SW Contact:  Elease Hashimoto, LCSW Phone Number: 07/10/2019, 10:16 AM   Clinical Narrative:   Insurance Josem Kaufmann via [place] [facility] [id number] active 2/19 until 2/26 with update then for continued approval. CM-Shawn Key fax [phone]. Spoke with Arsenio Loader via telephone to inform of plan he is planning to return MD's phone message left this am. DC paperwork completed and ready for transfer. Bedside RN aware of plan and will call report to [phone]. Will call EMS when bedside RN ready.    Final next level of care: Skilled Nursing Facility Barriers to Discharge: Barriers Resolved   Patient Goals and CMS Choice   CMS Medicare.gov Compare Post Acute Care list provided to:: Patient Represenative (must comment)(wife) Choice offered to / list presented to : Patient, Spouse  Discharge Placement   Existing PASRR number confirmed : 07/07/19          Patient chooses bed at: [facility] Patient to be transferred to facility by: EMS Name of family member notified: jason-son Patient and family notified of of transfer: 07/10/19  Discharge Plan and Services   Discharge Planning Services: CM Consult Post Acute Care Choice: [place]                               Social Determinants of Health (SDOH) Interventions     Readmission Risk Interventions No flowsheet data found.

## 2019-07-10 NOTE — TOC Progression Note (Signed)
Transition of Care (TOC) - Progression Note    Patient Details  Name: Jeremiah [name]. MRN: [id number] Date of Birth: 1940/06/07  Transition of Care [facility]) CM/SW Contact  Juno Bozard, Gardiner Rhyme, LCSW Phone Number: 07/10/2019, 8:34 AM  Clinical Narrative:  Spoke with Navi have given authorization for transfer to [facility] today if medically stable. Have messaged MD and will work on transfer today. Spoke with dana-Admission at [facility] she is ready for him. Auth number [id number] approved starting 2/19 with next review 2/26-8 days. CM-Shawn Key fax [phone]  Await medical readiness for transfer and contact family to make aware.     Expected Discharge Plan: [place] Barriers to Discharge: Continued Medical Work up  Expected Discharge Plan and Services Expected Discharge Plan: [place]   Discharge Planning Services: CM Consult Post Acute Care Choice: [place] Living arrangements for the past 2 months: Single Family Home                                       Social Determinants of Health (SDOH) Interventions    Readmission Risk Interventions No flowsheet data found.

## 2019-07-10 NOTE — Progress Notes (Signed)
Pt and wife says  He is missing his dentures and he had them when he came into hospital.called  1c  Today where he was transferred from on 2-18 and  Geralynn Ochs says no one had seen any dentues or noticed  Them in his mouth. Will let tiffany or kelly know. Maryelizabeth Rowan rn aware of denture situation.

## 2019-07-10 NOTE — Discharge Summary (Addendum)
[facility] NAME: Jeremiah [name]    MR#:  [id number]  DATE OF BIRTH:  03-06-1941  DATE OF ADMISSION:  07/02/2019 ADMITTING PHYSICIAN: Christel Mormon, MD  DATE OF DISCHARGE: 07/10/2019  PRIMARY CARE PHYSICIAN: Derinda Late, MD    ADMISSION DIAGNOSIS:  Encephalopathy acute [G93.40] Generalized weakness [R53.1] Rheumatoid arthritis, involving unspecified site, unspecified whether rheumatoid factor present ([place]) [M06.9] COVID-19 virus infection [U07.1] COVID-19 [U07.1]  DISCHARGE DIAGNOSIS:  Active Problems:   Rheumatoid arthritis flare (HCC)   COVID-19 virus infection   Seizure disorder (HCC)   Generalized weakness   Hypokalemia   Calf tenderness   Fever   Monoclonal B-cell lymphocytosis   Left wrist pain   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 Allergy   . Arthritis   . Collagen vascular disease ([place])   . Hypertension   . Rheumatoid arthritis ([place])   . Seizures ([place])   . Sepsis ([place]) 05/23/2015    HOSPITAL COURSE:   1.  Rheumatoid arthritis flare with left wrist swelling and hand pain.  Patient also had a fever.  Patient was started on IV Solu-Medrol.  The fever may have been a reaction to methotrexate.  This is not a good medication for this patient.  We also did give some IV folic acid while here and can continue oral folic acid upon going home.  I will do a slow prednisone taper.  Patient able to move his left wrist better than previously. 2.  COVID-19 infection.  Patient completed a course of remdesivir.  Patient had COVID-19 infection diagnosed at the end of December or early January.  The patient did have a swab here but likely positive from prior infection.  Patient not in isolation here. 3.  Acute metabolic encephalopathy.  This has improved.  His mental status is back to baseline. 4.  Seizure disorder on Lamictal 5.  Essential hypertension and  tachycardia.  Patient on metoprolol.  Holding Hyzaar 6.  Hypokalemia.  This was replaced 7.  Weakness.  Physical therapy recommends rehab. 8.  MDS.  Follow-up with Dr. Mike Gip as outpatient. 9.  Patient on dysphagia 1 diet with thin liquids  DISCHARGE CONDITIONS:   Satisfactory  CONSULTS OBTAINED:  Treatment Team:  Nolberto Hanlon, MD Tsosie Billing, MD Lequita Asal, MD  DRUG ALLERGIES:   Allergies  Allergen Reactions  . Cefoperazone Anaphylaxis  . Penicillins Anaphylaxis and Other (See Comments)    Has patient had a PCN reaction causing immediate rash, facial/tongue/throat swelling, SOB or lightheadedness with hypotension: Yes Has patient had a PCN reaction causing severe rash involving mucus membranes or skin necrosis: No Has patient had a PCN reaction that required hospitalization No Has patient had a PCN reaction occurring within the last 10 years: No If all of the above answers are "NO", then may proceed with Cephalosporin use.  . Latex Rash    DISCHARGE MEDICATIONS:   Allergies as of 07/10/2019      Reactions   Cefoperazone Anaphylaxis   Penicillins Anaphylaxis, Other (See Comments)   Has patient had a PCN reaction causing immediate rash, facial/tongue/throat swelling, SOB or lightheadedness with hypotension: Yes Has patient had a PCN reaction causing severe rash involving mucus membranes or skin necrosis: No Has patient had a PCN reaction that required hospitalization No Has patient had a PCN reaction occurring within the last 10 years: No If all of the above answers  are "NO", then may proceed with Cephalosporin use.   Latex Rash      Medication List    STOP taking these medications   Cinnamon 500 MG capsule   feeding supplement (PRO-STAT SUGAR FREE 64) Liqd   free water Soln   losartan-hydrochlorothiazide 50-12.5 MG tablet Commonly known as: HYZAAR   nystatin-triamcinolone ointment Commonly known as: MYCOLOG   pantoprazole 40 MG  tablet Commonly known as: PROTONIX     TAKE these medications   acetaminophen 325 MG tablet Commonly known as: TYLENOL Take 2 tablets (650 mg total) by mouth every 6 (six) hours as needed for mild pain (or Fever >/= 101).   ascorbic acid 500 MG tablet Commonly known as: VITAMIN C Take 1 tablet (500 mg total) by mouth daily.   aspirin 81 MG chewable tablet Chew 81 mg by mouth daily.   feeding supplement (ENSURE ENLIVE) Liqd Take 237 mLs by mouth 3 (three) times daily between meals. What changed:  how much to take how to take this when to take this   folic acid 1 MG tablet Commonly known as: FOLVITE Take 1 mg by mouth daily.   furosemide 40 MG tablet Commonly known as: LASIX Take 40 mg by mouth daily.   hydroxychloroquine 200 MG tablet Commonly known as: PLAQUENIL Take 200 mg by mouth daily.   lamoTRIgine 200 MG tablet Commonly known as: LAMICTAL Take 200 mg by mouth 2 (two) times daily.   lamoTRIgine 25 MG tablet Commonly known as: LAMICTAL Take 25 mg by mouth 2 (two) times daily.   magnesium oxide 400 MG tablet Commonly known as: MAG-OX Take 400 mg by mouth daily.   metoprolol tartrate 25 MG tablet Commonly known as: LOPRESSOR Take 1 tablet (25 mg total) by mouth 2 (two) times daily. What changed:  how much to take how to take this   nystatin 100000 UNIT/ML suspension Commonly known as: MYCOSTATIN Take 5 mLs (500,000 Units total) by mouth 4 (four) times daily.   omeprazole 40 MG capsule Commonly known as: PRILOSEC Take 40 mg by mouth daily as needed.   polyethylene glycol 17 g packet Commonly known as: MIRALAX / GLYCOLAX Take 17 g by mouth daily as needed. What changed:  when to take this reasons to take this   predniSONE 5 MG tablet Commonly known as: DELTASONE 4 tabs po day1, 3 tabs po day2; 2 tabs po day3,4; 1 tab po day5,6; 1/2 tab po day7,8,9,10   thiamine 100 MG tablet Take 1 tablet (100 mg total) by mouth daily.   traZODone 50 MG  tablet Commonly known as: DESYREL Take 50 mg by mouth at bedtime.   Vitamin D3 25 MCG tablet Commonly known as: Vitamin D Take 1 tablet (1,000 Units total) by mouth daily.   zinc sulfate 220 (50 Zn) MG capsule Take 1 capsule (220 mg total) by mouth daily.        DISCHARGE INSTRUCTIONS:   Follow-up team at rehab 1 day  If you experience worsening of your admission symptoms, develop shortness of breath, life threatening emergency, suicidal or homicidal thoughts you must seek medical attention immediately by calling 911 or calling your MD immediately  if symptoms less severe.  You Must read complete instructions/literature along with all the possible adverse reactions/side effects for all the Medicines you take and that have been prescribed to you. Take any new Medicines after you have completely understood and accept all the possible adverse reactions/side effects.   Please note  You were cared  for by a hospitalist during your hospital stay. If you have any questions about your discharge medications or the care you received while you were in the hospital after you are discharged, you can call the unit and asked to speak with the hospitalist on call if the hospitalist that took care of you is not available. Once you are discharged, your primary care physician will handle any further medical issues. Please note that NO REFILLS for any discharge medications will be authorized once you are discharged, as it is imperative that you return to your primary care physician (or establish a relationship with a primary care physician if you do not have one) for your aftercare needs so that they can reassess your need for medications and monitor your lab values.    Today   CHIEF COMPLAINT:   Chief Complaint  Patient presents with  . Back Pain    HISTORY OF PRESENT ILLNESS:  Jeremiah [name]  is a 79 y.o. male came in initially with back pain.   VITAL SIGNS:  Blood pressure 126/74, pulse 66,  temperature 97.7 F (36.5 C), temperature source Oral, resp. rate 18, height 6\' 1"  (1.854 m), weight 106.4 kg, SpO2 99 %.  I/O:    Intake/Output Summary (Last 24 hours) at 07/10/2019 0923 Last data filed at 07/10/2019 0816 Gross per 24 hour  Intake 237 ml  Output 800 ml  Net -563 ml    PHYSICAL EXAMINATION:  GENERAL:  79 y.o.-year-old patient lying in the bed with no acute distress.  EYES: Pupils equal, round, reactive to light and accommodation. No scleral icterus. Extraocular muscles intact.  HEENT: Head atraumatic, normocephalic. Oropharynx and nasopharynx clear.  NECK:  Supple, no jugular venous distention. No thyroid enlargement, no tenderness.  LUNGS: Normal breath sounds bilaterally, no wheezing, rales,rhonchi or crepitation. No use of accessory muscles of respiration.  CARDIOVASCULAR: S1, S2 normal. No murmurs, rubs, or gallops.  ABDOMEN: Soft, non-tender, non-distended. Bowel sounds present. No organomegaly or mass.  EXTREMITIES: No pedal edema, cyanosis, or clubbing.  Left wrist swelling.  Some pain to palpation.  Better range of motion left wrist and fingers today.  Able to make a fist. NEUROLOGIC: Cranial nerves II through XII are intact. Muscle strength 4/5 in all extremities. Sensation intact. Gait not checked.  PSYCHIATRIC: The patient is alert and oriented x 3.  SKIN: No obvious rash, lesion, or ulcer.   DATA REVIEW:   CBC Recent Labs  Lab 07/10/19 0422  WBC 7.1  HGB 10.0*  HCT 31.7*  PLT 372    Chemistries  Recent Labs  Lab 07/07/19 0421 07/07/19 2251 07/10/19 0422  NA 141   < > 143  K 3.3*   < > 3.9  CL 105   < > 114*  CO2 23   < > 25  GLUCOSE 89   < > 109*  BUN 21   < > 35*  CREATININE 0.95   < > 0.70  CALCIUM 8.4*   < > 8.4*  MG 2.1  --  2.4  AST 15  --   --   ALT 10  --   --   ALKPHOS 96  --   --   BILITOT 1.0  --   --    < > = values in this interval not displayed.     Microbiology Results  Results for orders placed or performed  during the hospital encounter of 07/02/19  Blood Culture (routine x 2)     Status: None  Collection Time: 07/02/19  4:27 PM   Specimen: BLOOD  Result Value Ref Range Status   Specimen Description BLOOD Blood Culture adequate volume  Final   Special Requests   Final    BOTTLES DRAWN AEROBIC AND ANAEROBIC LEFT ANTECUBITAL   Culture   Final    NO GROWTH 5 DAYS Performed at [facility], [address], [place], [place] [address]    Report Status 07/07/2019 FINAL  Final  Urine culture     Status: None   Collection Time: 07/02/19  5:16 PM   Specimen: Urine, Random  Result Value Ref Range Status   Specimen Description   Final    URINE, RANDOM Performed at [facility], [address] [place], [place] [address]    Special Requests   Final    NONE Performed at [facility], [address] [place], [place] [address]    Culture   Final    NO GROWTH Performed at [facility], [place] [address], [place], [place] [address]    Report Status 07/04/2019 FINAL  Final  Respiratory Panel by RT PCR (Flu A&B, Covid) - Nasopharyngeal Swab     Status: Abnormal   Collection Time: 07/02/19  6:35 PM   Specimen: Nasopharyngeal Swab  Result Value Ref Range Status   SARS Coronavirus 2 by RT PCR POSITIVE (A) NEGATIVE Final    Comment: RESULT CALLED TO, READ BACK BY AND VERIFIED WITH: ANDREA BRYANT 07/02/19 1940 KLW (NOTE) SARS-CoV-2 target nucleic acids are DETECTED. SARS-CoV-2 RNA is generally detectable in upper respiratory specimens  during the acute phase of infection. Positive results are indicative of the presence of the identified virus, but do not rule out bacterial infection or co-infection with other pathogens not detected by the test. Clinical correlation with patient history and other diagnostic information is necessary to determine patient infection status. The expected result is Negative. Fact Sheet for Patients:   PinkCheek.be Fact Sheet for Healthcare Providers: GravelBags.it This test is not yet approved or cleared by the [place] FDA and  has been authorized for detection and/or diagnosis of SARS-CoV-2 by FDA under an Emergency Use Authorization (EUA).  This EUA will remain in effect (meaning this test can be used) for th e duration of  the COVID-19 declaration under Section 564(b)(1) of the Act, 21 U.S.C. section 360bbb-3(b)(1), unless the authorization is terminated or revoked sooner.    Influenza A by PCR NEGATIVE NEGATIVE Final   Influenza B by PCR NEGATIVE NEGATIVE Final    Comment: (NOTE) The Xpert Xpress SARS-CoV-2/FLU/RSV assay is intended as an aid in  the diagnosis of influenza from Nasopharyngeal swab specimens and  should not be used as a sole basis for treatment. Nasal washings and  aspirates are unacceptable for Xpert Xpress SARS-CoV-2/FLU/RSV  testing. Fact Sheet for Patients: PinkCheek.be Fact Sheet for Healthcare Providers: GravelBags.it This test is not yet approved or cleared by the [place] FDA and  has been authorized for detection and/or diagnosis of SARS-CoV-2 by  FDA under an Emergency Use Authorization (EUA). This EUA will remain  in effect (meaning this test can be used) for the duration of the  Covid-19 declaration under Section 564(b)(1) of the Act, 21  U.S.C. section 360bbb-3(b)(1), unless the authorization is  terminated or revoked. Performed at [facility], [address] [place],  [address]   CULTURE, BLOOD (ROUTINE X 2) w Reflex to ID Panel     Status: None   Collection Time: 07/05/19  7:42 PM   Specimen: BLOOD  Result Value Ref Range Status   Specimen Description BLOOD LEFT ANTECUBITAL  Final   Special Requests   Final    BOTTLES DRAWN AEROBIC ONLY Blood Culture adequate volume   Culture   Final    NO  GROWTH 5 DAYS Performed at [facility], [address] [place], [place] [address]    Report Status 07/10/2019 FINAL  Final  CULTURE, BLOOD (ROUTINE X 2) w Reflex to ID Panel     Status: None   Collection Time: 07/05/19  7:45 PM   Specimen: BLOOD  Result Value Ref Range Status   Specimen Description BLOOD LEFT HAND  Final   Special Requests   Final    BOTTLES DRAWN AEROBIC ONLY Blood Culture adequate volume   Culture   Final    NO GROWTH 5 DAYS Performed at [facility], [address] [place], [place] [address]    Report Status 07/10/2019 FINAL  Final  MRSA PCR Screening     Status: None   Collection Time: 07/05/19  7:55 PM   Specimen: Nasal Mucosa; Nasopharyngeal  Result Value Ref Range Status   MRSA by PCR NEGATIVE NEGATIVE Final    Comment:        The GeneXpert MRSA Assay (FDA approved for NASAL specimens only), is one component of a comprehensive MRSA colonization surveillance program. It is not intended to diagnose MRSA infection nor to guide or monitor treatment for MRSA infections. Performed at [facility], [address], [place], [place] [address]   Urine Culture     Status: Abnormal   Collection Time: 07/06/19  8:22 AM   Specimen: Urine, Random  Result Value Ref Range Status   Specimen Description   Final    URINE, RANDOM Performed at [facility], [address] [place], [place] [address]    Special Requests   Final    NONE Performed at [facility], [address] [place], [place] [address]    Culture MULTIPLE SPECIES PRESENT, SUGGEST RECOLLECTION (A)  Final   Report Status 07/07/2019 FINAL  Final      Management plans discussed with the patient, family (yesterday) and they are in agreement.  Left message for family today  CODE STATUS:     Code Status Orders  (From admission, onward)         Start     Ordered   07/02/19 2017  Full code  Continuous     07/02/19 2023        Code Status  History    Date Active Date Inactive Code Status Order ID Comments User Context   04/22/2019 1038 04/27/2019 2053 Full Code [id number]  [name], MD Inpatient   04/21/2019 1313 04/22/2019 1038 DNR [id number]  Jimmy Footman, NP Inpatient   03/31/2019 1819 04/21/2019 1313 Full Code [id number]  Lavina Hamman, MD Inpatient   02/20/2018 2220 02/24/2018 2019 Full Code [id number]  Lance Coon, MD ED   05/23/2015 2221 05/26/2015 1741 Full Code [id number]  Hower, Aaron Mose, MD ED   Advance Care Planning Activity      TOTAL TIME TAKING CARE OF THIS PATIENT: 35 minutes.    Loletha Grayer M.D on 07/10/2019 at 9:23 AM  Between 7am to 6pm - Pager - [phone]  After 6pm go to [email or web address] - password [email or web address]  [facility]  CC: Primary care physician; Derinda Late, MD

## 2019-07-13 LAB — COMP PANEL: LEUKEMIA/LYMPHOMA: Immunophenotypic Profile: 0

## 2019-07-13 LAB — MULTIPLE MYELOMA PANEL, SERUM
Albumin SerPl Elph-Mcnc: 1.9 g/dL — ABNORMAL LOW (ref 2.9–4.4)
Albumin/Glob SerPl: 0.7 (ref 0.7–1.7)
Alpha 1: 0.4 g/dL (ref 0.0–0.4)
Alpha2 Glob SerPl Elph-Mcnc: 0.8 g/dL (ref 0.4–1.0)
B-Globulin SerPl Elph-Mcnc: 0.7 g/dL (ref 0.7–1.3)
Gamma Glob SerPl Elph-Mcnc: 1.1 g/dL (ref 0.4–1.8)
Globulin, Total: 3.1 g/dL (ref 2.2–3.9)
IgA: 365 mg/dL (ref 61–437)
IgG (Immunoglobin G), Serum: 1183 mg/dL (ref 603–1613)
IgM (Immunoglobulin M), Srm: 125 mg/dL (ref 15–143)
Total Protein ELP: 5 g/dL — ABNORMAL LOW (ref 6.0–8.5)

## 2019-07-20 ENCOUNTER — Inpatient Hospital Stay: Payer: Medicare PPO | Attending: Hematology and Oncology | Admitting: Hematology and Oncology

## 2019-07-28 NOTE — Progress Notes (Signed)
This encounter was created in error - please disregard.

## 2019-08-06 ENCOUNTER — Encounter: Payer: Self-pay | Admitting: Hematology and Oncology

## 2019-08-06 ENCOUNTER — Other Ambulatory Visit: Payer: Self-pay

## 2019-08-06 NOTE — Progress Notes (Signed)
[facility]  [address], [address] [place], [place] [address] Phone: [phone]  Fax: [phone]   Telemedicine Office Visit:  08/07/2019  Referring physician: Derinda Late, MD  I connected with Jeremiah [name]. on 08/07/19 at 3:06 PM by videoconferencing and verified that I was speaking with the correct person using 2 identifiers.  The patient was at home.  I discussed the limitations, risk, security and privacy concerns of performing an evaluation and management service by videoconferencing and the availability of in person appointments.  I also discussed with the patient that there may be a patient responsible charge related to this service.  The patient expressed understanding and agreed to proceed.   Chief Complaint: Jeremiah [name]. is a 79 y.o. male with rheumatoid arthritis previously on methotrexate and a history of pancytopenia who is seen for assessment after interval hospitalization.  HPI: The patient is a poor historian.  History is primarily obtained from the chart and notes from his hospital admissions.  The patient was admitted to [facility] from 03/31/2019 - 04/27/2019 with bilateral lower extremity cellulitis unresponsive to outpatient management in conjunction with acute kidney injury (Cr 3.12).  He was initially treated with ciprofloxacin and clindamycin in the outpatient department.  He was switched to vancomycin and then doxycycline.  Cultures remained negative.  He was later felt to have severe lymphedema/venous edema mimicking cellulitis.  While hospitalized, he developed altered mental status.  Head MRI was negative. EEG revealed slowing.  TSH, B12, B1, and ammonia were normal.  He was felt to have baseline dementia and a decline prior to hospitalization with overlying metabolic encephalopathy.  He was unable to eat safely.  He underwent PEG tube placement on 04/24/2019.  He developed pancytopenia felt secondary to methotrexate  toxicity.  He had not been taking folic acid.  He had folic acid deficiency and was started on oral folate. Nadir counts on 04/05/2019 revealed a hematocrit of 27.0, hemoglobin 8.9, MCV 85.2, platelets 81,000, and WBC 1000 (ANC 200).  He received GCSF (Granix) from 04/11/2019 - 04/14/2019.  Counts on 04/24/2019 revealed a hematocrit of 26.6, hemoglobin 8.6, platelets 570,000, and WBC 15,200.  Peripheral blood flow cytometry on 04/09/2020 revealed 1% myeloblasts in the setting of pancytopenia and phenotypic aberrancies of neutrophils and monocytes.  There was a CD5-, CD10- clonal B cell population, non specific phenotype, 2% of leukocytes (< 5000/uL) c/w a monoclonal B-cell lymphocytosis.  Bone marrow on 04/10/2019 revealed a hypercellular marrow with erythroid hyperplasia, rare ringed sideroblasts and dyspoiesis. There was polytic plasmacytosis (6-10%). The plasma cells coexpressed CD56 (unusual) but appeared polytypic by kappa and lambda in situ hybridization.  There were no increased blasts. Cytogenetics were normal (26, XX). Bone marrow findings were felt likely related to methotrexate or possibly a myelodysplastic syndrome (MDS).  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  MYD88  L265P can be seen in lymphoplasmacytic lymphoma/Wldenstrom's macroglobulinemia, IgM MGUS, CLL, marginal zone lymphoma, splenic lymphoma, and some non-germinal center subtype diffuse large B celll lymphomas.  The patient was discharged to skilled nursing ([facility]).  He developed COVID-19 at [facility] in 05/2019.  He was discharged to home later in 05/2019.  He began eating by mouth.  PEG tube was removed in late 05/2019.  He was admitted to [facility] from 07/02/2019 to 07/10/2019. He had generalized weakness, fever of 102.1, chills and acute encephalopathy. He was diffusely painful to touch. COVID-19 testing was positive  by PCR. Influenza A and B  were negative. Blood cultures were negative. Urine cultures grew multiple species. CXR on 07/02/2019 showed no acute intrathoracic process. He initially received aztreonam, Flagyl, and vancomycin. He received remdesivir. Meropenem was added on 07/05/2019 and vancomycin on 07/06/2019.  He was started on IV Solumedrol on 07/08/2019 for a rheumatoid flare.  The fever was felt to possibly represent a flare of his arthritis.  He had received methotrexate on 06/22/2019 and 06/29/2019.  He received IV folic acid.  He was to continue oral folic acid and a slow prednisone taper. Physical therapy recommended rehab.   Lower bilateral venous ultrasound on 07/05/2019 showed no evidence of DVT in either lower extremity. Chest CT angiogram on 07/05/2019 revealed no pulmonary embolism.  There were no enlarged mediastinal, hilar or axillary adenopathy.  Flow cytometry on 07/09/2019 revealed a CD5, CD10, and CD103 negative monoclonal B cell population , non specific phenotype, <1% of leukocytes, < 5,000/uL.  This represent a monoclonal B-cell lymphocytosis.  This is not the typical phenotype for CLL, hairy cell leukemia, or follicular center cell lymphoma.  SPEP on 07/09/2019 revealed no monoclonal protein and normal immunoglobulins.  Symptomatically, he has significant difficulty with his arthritis. At this time, he is off of methotrexate. He understands that should he get back on methotrexate for his arthritis ; he needs to have his blood work followed closely and to take his folate regularly. He believes that if his arthritic pain was managed, he would be able to be much more active than he currently is at home. His family is having to help him get dressed, bathe, and use the bathroom. He can still eat by his self, but he does have some difficulty lifting his arms. His son has ordered a sling to help him lift the patient. Patient notes that he has lost weight, but that he wasn't eating well while he was in the skilled  living facility. Since he has been home on 08/01/2019, he has gained some weight back and has a healthy appetite. He is agreeable to meet with a nutritionalist to help him facilitate weight gain.    Past Medical History:  Diagnosis Date  . Allergy   . Arthritis   . Collagen vascular disease ([place])   . Hypertension   . Rheumatoid arthritis ([place])   . Seizures ([place])   . Sepsis ([place]) 05/23/2015    Past Surgical History:  Procedure Laterality Date  . BACK SURGERY    . back surgery five times  04/2016  . CARPAL TUNNEL RELEASE    . HERNIA REPAIR    . JOINT REPLACEMENT    . knee replacement right    . PEG PLACEMENT N/A 04/24/2019   Procedure: PERCUTANEOUS ENDOSCOPIC GASTROSTOMY (PEG) PLACEMENT;  Surgeon: Toledo, Benay Pike, MD;  Location: [facility] ENDOSCOPY;  Service: Gastroenterology;  Laterality: N/A;  . right hip replacement    . rotator cuff surgery    . SPINE SURGERY      Family History  Problem Relation Age of Onset  . Hypertension Other   . Hypertension Mother   . Dementia Mother   . Dementia Father   . Prostate cancer Neg Hx   . Bladder Cancer Neg Hx   . Kidney cancer Neg Hx     Social History:  reports that he has quit smoking. His smoking use included cigarettes. He has a 20.00 pack-year smoking history. He has never used smokeless tobacco. He reports that he does not drink alcohol or use  drugs.  He denies any exposure to radiation or toxins.  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  He lives in [place].  His wife's name is [name]. The patient is accompanied by his son, Herbie [place], today.  Participants in the patient's visit and their role in the encounter included the patient, [employer], and [employer], CMA, today.  The intake visit was provided by Vito Berger, CMA.    Allergies:  Allergies  Allergen Reactions  . Cefoperazone Anaphylaxis  . Penicillins Anaphylaxis and Other  (See Comments)    Has patient had a PCN reaction causing immediate rash, facial/tongue/throat swelling, SOB or lightheadedness with hypotension: Yes Has patient had a PCN reaction causing severe rash involving mucus membranes or skin necrosis: No Has patient had a PCN reaction that required hospitalization No Has patient had a PCN reaction occurring within the last 10 years: No If all of the above answers are "NO", then may proceed with Cephalosporin use.  . Latex Rash    Current Medications: Current Outpatient Medications  Medication Sig Dispense Refill  . acetaminophen (TYLENOL) 325 MG tablet Take 2 tablets (650 mg total) by mouth every 6 (six) hours as needed for mild pain (or Fever >/= 101).    Marland Kitchen ascorbic acid (VITAMIN C) 500 MG tablet Take 1 tablet (500 mg total) by mouth daily. 30 tablet 0  . aspirin 81 MG chewable tablet Chew 81 mg by mouth daily.    . cholecalciferol (VITAMIN D) 25 MCG tablet Take 1 tablet (1,000 Units total) by mouth daily. 30 tablet 0  . folic acid (FOLVITE) 1 MG tablet Take 1 mg by mouth daily.    . furosemide (LASIX) 40 MG tablet Take 40 mg by mouth daily.    . hydroxychloroquine (PLAQUENIL) 200 MG tablet Take 200 mg by mouth daily.    Marland Kitchen lamoTRIgine (LAMICTAL) 200 MG tablet Take 200 mg by mouth 2 (two) times daily.    Marland Kitchen lamoTRIgine (LAMICTAL) 25 MG tablet Take 25 mg by mouth 2 (two) times daily.    . magnesium oxide (MAG-OX) 400 MG tablet Take 400 mg by mouth daily.    . metoprolol tartrate (LOPRESSOR) 25 MG tablet Take 1 tablet (25 mg total) by mouth 2 (two) times daily. 60 tablet 11  . omeprazole (PRILOSEC) 40 MG capsule Take 40 mg by mouth daily as needed.     . polyethylene glycol (MIRALAX / GLYCOLAX) 17 g packet Take 17 g by mouth daily as needed. 14 each 0  . thiamine 100 MG tablet Take 1 tablet (100 mg total) by mouth daily. 30 tablet 0  . traZODone (DESYREL) 50 MG tablet Take 50 mg by mouth at bedtime.    Marland Kitchen zinc sulfate 220 (50 Zn) MG capsule Take 1  capsule (220 mg total) by mouth daily. 30 capsule 0  . feeding supplement, ENSURE ENLIVE, (ENSURE ENLIVE) LIQD Take 237 mLs by mouth 3 (three) times daily between meals. (Patient not taking: Reported on 08/06/2019) 21330 mL 0  . nystatin (MYCOSTATIN) 100000 UNIT/ML suspension Take 5 mLs (500,000 Units total) by mouth 4 (four) times daily. (Patient not taking: Reported on 08/06/2019) 60 mL 0  . predniSONE (DELTASONE) 5 MG tablet 4 tabs po day1, 3 tabs po day2; 2 tabs po day3,4; 1 tab po day5,6; 1/2 tab po day7,8,9,10 (Patient not taking: Reported on 08/06/2019) 15 tablet 0   No current  facility-administered medications for this visit.    Review of Systems  Constitutional: Positive for malaise/fatigue and weight loss (25 pounds; up 1 pound since being home). Negative for chills, diaphoresis and fever.       Improved health since being home.  HENT: Negative for congestion, ear pain, nosebleeds, sinus pain and sore throat.   Eyes: Negative.  Negative for blurred vision and double vision.  Respiratory: Negative.  Negative for cough, hemoptysis, sputum production and shortness of breath.   Cardiovascular: Negative.  Negative for chest pain, palpitations and leg swelling.  Gastrointestinal: Positive for constipation (occasionally). Negative for abdominal pain, blood in stool, diarrhea, heartburn, nausea and vomiting.       Appetite is good.  Genitourinary: Negative.  Negative for dysuria, frequency, hematuria and urgency.  Musculoskeletal: Positive for joint pain (rheumatoid and osteoarthritis). Negative for back pain, falls and myalgias.  Skin: Negative.  Negative for rash.  Neurological: Negative.  Negative for dizziness, sensory change, speech change, focal weakness, seizures, weakness (generalized) and headaches.       Family uses a lift for assistance with moving.  Endo/Heme/Allergies: Negative.  Does not bruise/bleed easily.  Psychiatric/Behavioral: Negative.  Negative for depression and memory  loss. The patient is not nervous/anxious and does not have insomnia.    Performance status (ECOG):  3  Vitals There were no vitals taken for this visit.   Physical Exam  Constitutional: He is oriented to person, place, and time. He appears well-developed and well-nourished. No distress.  Patient sitting comfortably in a lounge chair at home in no acute distress.  HENT:  Head: Normocephalic and atraumatic.  Short hair.  Male pattern baldness.  Eyes: EOM are normal. No scleral icterus.  Neurological: He is alert and oriented to person, place, and time.  Psychiatric: He has a normal mood and affect. His behavior is normal. Judgment and thought content normal.  Nursing note reviewed.   No visits with results within 3 Day(s) from this visit.  Latest known visit with results is:  No results displayed because visit has over 200 results.      Assessment:  Jeremiah [name]. is a 79 y.o. male withrheumatoid arthritis and a history of pancytopenia s/p methotrexate toxicity in 03/2019.  He had not been taking folic acid.  He had folic acid deficiency and was started on oral folate. Nadir counts on 04/05/2019 revealed a hematocrit of 27.0, hemoglobin 8.9, MCV 85.2, platelets 81,000, and WBC 1000 (ANC 200).  He received GCSF (Granix) from 04/11/2019 - 04/14/2019.  Counts on 04/24/2019 revealed a hematocrit of 26.6, hemoglobin 8.6, platelets 570,000, and WBC 15,200.  Peripheral blood flow cytometry on 04/09/2020 revealed  1% myeloblasts in the setting of pancytopenia and phenotypic aberrancies of neutrophils and monocytes.  There was a CD5-, CD10- clonal B cell population, non specific phenotype, 2% of leukocytes (< 5000/uL) c/w a monoclonal B-cell lymphocytosis.  Bone marrow on 04/10/2019 revealed a hypercellular marrow with erythroid hyperplasia, rare ringed sideroblasts and dyspoiesis. There was polytic plasmacytosis (6-10%). The plasma cells coexpressed CD56 (unusual) but appeared polytypic  by kappa and lambda in situ hybridization.  There were no increased blasts. Cytogenetics were normal (79, XX). Bone marrow findings were felt likely related to methotrexate or possibly a myelodysplastic syndrome (MDS).  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  MYD88  L265P can be seen in lymphoplasmacytic lymphoma/Wldenstrom's macroglobulinemia, IgM MGUS, CLL, marginal zone lymphoma, splenic lymphoma, and some non-germinal center  subtype diffuse large B celll lymphomas.  Flow cytometry on 07/09/2019 revealed a CD5, CD10, and CD103 negative monoclonal B cell population , non specific phenotype, <1% of leukocytes, < 5,000/uL.  This represent a monoclonal B-cell lymphocytosis.  This is not the typical phenotype for CLL, hairy cell leukemia, or follicular center cell lymphoma.  SPEP on 07/09/2019 revealed no monoclonal protein and normal immunoglobulins.  He last received methotrexate on 06/22/2019 and 06/29/2019.  He has a history of folic acid deficiency.   Folate was 3.4 on 04/06/2019, 11.5 on 04/22/2019, and 11.4 on 07/08/2019.  He was admitted to [facility] from 07/02/2019 to 07/10/2019 with weakness, fever of 102.1, chills and acute encephalopathy. He was diffusely painful to touch. COVID-19 testing was positive by PCR. Influenza A and B were negative. Blood cultures were negative. Urine cultures grew multiple species.  He received broad spectrum antibiotics and remdesivir.  He was treated with Solumedrol for a rheumatoid flare and discharged on a slow prednisone taper.  He received IV then oral folic acid.  Chest CT angiogram on 07/05/2019 revealed no pulmonary embolism.  There were no enlarged mediastinal, hilar or axillary adenopathy.  Bilateral lower extremity duplex on 07/05/2019 showed no evidence of DVT in either lower extremity.  Symptomatically, he has significant pain and difficulty with mobility secondary to rheumatoid  arthritis.    Plan: 1.   Review interim labs. 2.   Monoclonal B-cell lymphocytosis  Significance is unclear.  He may have a monoclonal B cell lymphocytosis of unknown significance or possibly an occult lymphoma.  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  SPEP revealed no monoclonal protein.  IgM, IgG, and IgA were normal.  Chest CT angiogram on 07/05/2019 revealed no adenopathy.  His current symptoms appear to be related to his rheumatoid arthritis (not treated).  Discuss consideration of abdomen and pelvis CT if patient does not improve. 3.   Folic acid deficiency  Patient initially developed folic acid deficiency when not taking supplementation on methotrexate.  Folate was 11.4 on 07/08/2019.  Continue to monitor. 4.   Weight loss  He has gained 1 pound since being home this week.  Discuss importance of caloric intake.  Nutrition consult. 5.   Rheumatoid arthritis  Encourage follow-up with his [place]. 6.   Patient to have labs drawn by home health care. 7.   RTC in 3-4 weeks for MD assessment (in person or WebEx).  I discussed the assessment and treatment plan with the patient.  The patient was provided an opportunity to ask questions and all were answered.  The patient agreed with the plan and demonstrated an understanding of the instructions.  The patient was advised to call back if the symptoms worsen or if the condition fails to improve as anticipated.  I provided 30 minutes (2:35 PM - 3:05 PM) of face-to-face time video conferencing during this this encounter and > 50% was spent counseling as documented under my assessment and plan.  An additional 10 minutes were spent reviewing his chart (Epic and Care Everywhere) including notes, labs, and imaging studies.     C. Mike Gip, MD, PhD    08/07/2019, 3:06 PM  I, Jacqualyn Posey, am  acting as Education administrator for [employer]. Mike Gip, MD, PhD.  I,  C. Mike Gip, MD,  have reviewed the above documentation for accuracy and completeness, and I agree with the above.

## 2019-08-06 NOTE — Progress Notes (Signed)
Patient c/o lower back pain (5) constant. The patient name and DOB has been verified by phone today

## 2019-08-07 ENCOUNTER — Inpatient Hospital Stay (HOSPITAL_BASED_OUTPATIENT_CLINIC_OR_DEPARTMENT_OTHER): Payer: Medicare PPO | Admitting: Hematology and Oncology

## 2019-08-07 ENCOUNTER — Encounter: Payer: Self-pay | Admitting: Hematology and Oncology

## 2019-08-07 DIAGNOSIS — E538 Deficiency of other specified B group vitamins: Secondary | ICD-10-CM

## 2019-08-07 DIAGNOSIS — R634 Abnormal weight loss: Secondary | ICD-10-CM | POA: Diagnosis not present

## 2019-08-07 DIAGNOSIS — D72820 Lymphocytosis (symptomatic): Secondary | ICD-10-CM | POA: Diagnosis not present

## 2019-08-11 ENCOUNTER — Telehealth: Payer: Self-pay | Admitting: Hematology and Oncology

## 2019-08-11 NOTE — Telephone Encounter (Signed)
Re:  Conversation with [facility]  Today I spoke with Domenic Polite, NP at the [facility].  I reviewed his hospitalizations and issues with low counts secondary to methotrexate.  I discussed his B cell lymphocytosis.  She will be seeing him the first week in 08/2019.  Care will be coordinated.  Her email at the [place] is [email or web address].   Lequita Asal, MD

## 2019-08-22 ENCOUNTER — Emergency Department: Payer: Medicare PPO

## 2019-08-22 ENCOUNTER — Other Ambulatory Visit: Payer: Self-pay

## 2019-08-22 ENCOUNTER — Emergency Department
Admission: EM | Admit: 2019-08-22 | Discharge: 2019-08-23 | Disposition: A | Discharge status: Home / Self Care | Payer: Medicare PPO | Attending: Emergency Medicine | Admitting: Emergency Medicine

## 2019-08-22 DIAGNOSIS — M069 Rheumatoid arthritis, unspecified: Secondary | ICD-10-CM | POA: Diagnosis not present

## 2019-08-22 DIAGNOSIS — Z96651 Presence of right artificial knee joint: Secondary | ICD-10-CM | POA: Diagnosis not present

## 2019-08-22 DIAGNOSIS — Z91040 Latex allergy status: Secondary | ICD-10-CM | POA: Insufficient documentation

## 2019-08-22 DIAGNOSIS — Z87891 Personal history of nicotine dependence: Secondary | ICD-10-CM | POA: Diagnosis not present

## 2019-08-22 DIAGNOSIS — Z8616 Personal history of COVID-19: Secondary | ICD-10-CM | POA: Diagnosis not present

## 2019-08-22 DIAGNOSIS — Z79899 Other long term (current) drug therapy: Secondary | ICD-10-CM | POA: Insufficient documentation

## 2019-08-22 DIAGNOSIS — Z20822 Contact with and (suspected) exposure to covid-19: Secondary | ICD-10-CM | POA: Insufficient documentation

## 2019-08-22 DIAGNOSIS — E1140 Type 2 diabetes mellitus with diabetic neuropathy, unspecified: Secondary | ICD-10-CM | POA: Insufficient documentation

## 2019-08-22 DIAGNOSIS — R531 Weakness: Secondary | ICD-10-CM | POA: Insufficient documentation

## 2019-08-22 DIAGNOSIS — Z7982 Long term (current) use of aspirin: Secondary | ICD-10-CM | POA: Insufficient documentation

## 2019-08-22 DIAGNOSIS — I10 Essential (primary) hypertension: Secondary | ICD-10-CM | POA: Diagnosis not present

## 2019-08-22 LAB — COMPREHENSIVE METABOLIC PANEL
ALT: 8 U/L (ref 0–44)
AST: 13 U/L — ABNORMAL LOW (ref 15–41)
Albumin: 2.9 g/dL — ABNORMAL LOW (ref 3.5–5.0)
Alkaline Phosphatase: 124 U/L (ref 38–126)
Anion gap: 7 (ref 5–15)
BUN: 14 mg/dL (ref 8–23)
CO2: 28 mmol/L (ref 22–32)
Calcium: 9.2 mg/dL (ref 8.9–10.3)
Chloride: 105 mmol/L (ref 98–111)
Creatinine, Ser: 0.83 mg/dL (ref 0.61–1.24)
GFR calc Af Amer: >60 mL/min (ref >60)
GFR calc non Af Amer: >60 mL/min (ref >60)
Glucose, Bld: 84 mg/dL (ref 70–99)
Potassium: 3.8 mmol/L (ref 3.5–5.1)
Sodium: 140 mmol/L (ref 135–145)
Total Bilirubin: 0.4 mg/dL (ref 0.3–1.2)
Total Protein: 7 g/dL (ref 6.5–8.1)

## 2019-08-22 LAB — CBC WITH DIFFERENTIAL/PLATELET
Abs Immature Granulocytes: 0.01 10*3/uL (ref 0.00–0.07)
Basophils Absolute: 0 10*3/uL (ref 0.0–0.1)
Basophils Relative: 1 %
Eosinophils Absolute: 0.2 10*3/uL (ref 0.0–0.5)
Eosinophils Relative: 4 %
HCT: 38.6 % — ABNORMAL LOW (ref 39.0–52.0)
Hemoglobin: 12.4 g/dL — ABNORMAL LOW (ref 13.0–17.0)
Immature Granulocytes: 0 %
Lymphocytes Relative: 20 %
Lymphs Abs: 1.1 10*3/uL (ref 0.7–4.0)
MCH: 26.1 pg (ref 26.0–34.0)
MCHC: 32.1 g/dL (ref 30.0–36.0)
MCV: 81.3 fL (ref 80.0–100.0)
Monocytes Absolute: 0.5 10*3/uL (ref 0.1–1.0)
Monocytes Relative: 10 %
Neutro Abs: 3.6 10*3/uL (ref 1.7–7.7)
Neutrophils Relative %: 65 %
Platelets: 179 10*3/uL (ref 150–400)
RBC: 4.75 MIL/uL (ref 4.22–5.81)
RDW: 17.4 % — ABNORMAL HIGH (ref 11.5–15.5)
WBC: 5.4 10*3/uL (ref 4.0–10.5)
nRBC: 0 % (ref 0.0–0.2)

## 2019-08-22 LAB — TROPONIN I (HIGH SENSITIVITY): Troponin I (High Sensitivity): 9 ng/L (ref <18)

## 2019-08-22 LAB — LACTIC ACID, PLASMA: Lactic Acid, Venous: 1.1 mmol/L (ref 0.5–1.9)

## 2019-08-22 LAB — AMMONIA: Ammonia: 25 umol/L (ref 9–35)

## 2019-08-22 MED ORDER — SODIUM CHLORIDE 0.9 % IV BOLUS
1000.0000 mL | Freq: Once | INTRAVENOUS | Status: AC
  Administered 2019-08-23: 1000 mL via INTRAVENOUS

## 2019-08-22 NOTE — ED Notes (Signed)
Daughter in law Alyse Low called and wanted [place] to know about pt's history of cellulitis and lymphedema.  She stated the swelling in the pt's feet is better than it has been, so the son has not been giving him his fluid pill.

## 2019-08-22 NOTE — ED Provider Notes (Signed)
[facility] Emergency Department Provider Note  ____________________________________________  Time seen: Approximately 8:52 PM  I have reviewed the triage vital signs and the nursing notes.   HISTORY  Chief Complaint Weakness    HPI Jeremiah [name]. is a 79 y.o. male who presents the emergency department complaining of generalized weakness.  Has had 2 lengthy recent admissions.  Patient had significant cellulitis, was admitted to the hospital in November for the emergency department.  Patient then further spent a week in the hospital in February for encephalopathy, rheumatoid arthritis, COVID-19.  Patient has a recent diagnosis of monoclonal B-cell lymphocytosis and is currently undergoing work-up for same.  Patient was concerned as today he was much weaker than normal.  Patient cannot ambulate without assistance but typically can stand with a Hoyer lift without difficulty.  Patient was too weak today to stand.  Family was concerned as patient may have had a low-grade fever of 99 F this evening.  He denies any headache, visual changes, chest pain, shortness of breath.  No URI symptoms.  No abdominal pain, nausea vomiting, diarrhea or constipation.  No dysuria, polyuria, hematuria.  Patient's main complaint at this time is just feeling generally weak.  According to the wife, patient seems to be slightly more confused than normal today.  Patient has also had difficulty when picking up food or drink.  Patient typically is able to feed himself with no difficulty.  The wife noticed that the patient is missing his mouth with his cups and his food.  This is a new finding only today.  Patient with a significant medical history as described below.        Past Medical History:  Diagnosis Date  . Allergy   . Arthritis   . Collagen vascular disease ([place])   . Hypertension   . Rheumatoid arthritis ([place])   . Seizures ([place])   . Sepsis ([place]) 05/23/2015    Patient Active  Problem List   Diagnosis Date Noted  . Monoclonal B-cell lymphocytosis   . Left wrist pain   . 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 Fever   . Calf tenderness   . Encephalopathy acute   . Generalized weakness   . Hypokalemia   . COVID-19 virus infection 07/02/2019  . Dysphagia   . Oral mucositis 04/14/2019  . Neutropenia with fever ([place]) 04/11/2019  . Acute metabolic encephalopathy [id number]  . Pancytopenia ([place])   . Cellulitis of lower extremity 04/08/2019  . Folic acid deficiency   . AKI (acute kidney injury) ([place])   . Pressure injury of skin 04/05/2019  . Other pancytopenia ([place])   . Cellulitis 03/31/2019  . Edema of both legs 08/19/2018  . History of allergy to latex 07/22/2018  . Lymphedema 06/16/2018  . Chronic venous insufficiency 06/16/2018  . Venous ulcer of leg ([place]) 06/16/2018  . PAD (peripheral artery disease) ([place]) 06/16/2018  . Diabetes mellitus type 2, uncomplicated ([place]) 0000000  . 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 Obesity (BMI 30.0-34.9) 02/27/2018  . Primary osteoarthritis of left knee 02/27/2018  . Urinary retention 02/25/2018  . Rheumatoid arthritis involving multiple sites ([place]) 02/25/2018  . Left knee pain 02/20/2018  . Neurogenic pain 10/02/2017  . Spondylosis without myelopathy or radiculopathy, lumbosacral region 07/17/2017  . Trigger point with back pain (recurrent/intermittent) 07/17/2017  . Chronic musculoskeletal pain 02/27/2017  . Myofascial pain syndrome 02/27/2017  . Long term current use of anticoagulant (Plaquenil) 02/27/2017  . Chronic lumbar radiculitis (  L5 dermatome) (Right) 02/27/2017  . Chronic lumbar radiculitis (L3 dermatome) (Left) 02/27/2017  . Chronic foot pain (Right) 02/27/2017  . Chronic thoracic back pain (Bilateral) (R>L) 02/27/2017  . Chronic neck pain (posterior) (Bilateral) (R>L) 02/27/2017  . Chronic shoulder pain (Bilateral) (L>R) 02/27/2017  . 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 DDD (degenerative disc disease), lumbar 02/19/2017  . 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 Chronic knee pain (Bilateral) (R>L) 02/19/2017  . 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 Grade 1 Retrolisthesis of L1 over L2 02/19/2017  . T11 wedge compression fracture ([place]x. 1993) 02/19/2017  . Lumbar facet arthropathy 02/19/2017  . Lumbar facet syndrome (Bilateral) 02/19/2017  . Lumbar foraminal stenosis (Bilateral: L1-2)  (Right: L3-4, L4-5) 02/19/2017  . 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 Disorder of skeletal system 02/19/2017  . Pharmacologic therapy 02/19/2017  . Problems influencing health status 02/19/2017  . Chronic pain syndrome 01/14/2017  . Gastroesophageal reflux disease 01/14/2017  . Hyperlipidemia 01/14/2017  . Lumbar radiculopathy 01/14/2017  . Postoperative anemia due to acute blood loss 01/14/2017  . Postprocedural hypotension 01/14/2017  . Chronic lower extremity pain (Secondary Area of Pain) (Bilateral) (R>L) 01/14/2017  . Rheumatoid arthritis ([place]) 12/17/2013  . Diabetes ([place]) 12/17/2013  . Benign essential hypertension 11/13/2013  . Chronic low back pain (Primary Area of Pain) (Bilateral) (R>L) 11/13/2013  . Prostatitis 11/13/2013  . Seizure ([place]) 11/13/2013  . Left bundle branch block 05/21/2013  . Dorsalgia 05/21/2013    Past Surgical History:  Procedure Laterality Date  . BACK SURGERY    . back surgery five times  04/2016  . CARPAL TUNNEL RELEASE    . HERNIA REPAIR    . JOINT REPLACEMENT    . knee replacement right    . PEG PLACEMENT N/A 04/24/2019   Procedure: PERCUTANEOUS ENDOSCOPIC GASTROSTOMY (PEG) PLACEMENT;  Surgeon: Toledo, Benay Pike, MD;  Location: [facility] ENDOSCOPY;  Service: Gastroenterology;  Laterality: N/A;  . right hip replacement    . rotator cuff surgery    . SPINE SURGERY      Prior to Admission medications   Medication Sig Start Date End Date Taking? Authorizing Provider   acetaminophen (TYLENOL) 325 MG tablet Take 2 tablets (650 mg total) by mouth every 6 (six) hours as needed for mild pain (or Fever >/= 101). 02/24/18   Nicholes Mango, MD  ascorbic acid (VITAMIN C) 500 MG tablet Take 1 tablet (500 mg total) by mouth daily. 07/10/19   Loletha Grayer, MD  aspirin 81 MG chewable tablet Chew 81 mg by mouth daily.    [provider]  cholecalciferol (VITAMIN D) 25 MCG tablet Take 1 tablet (1,000 Units total) by mouth daily. 07/10/19   Loletha Grayer, MD  feeding supplement, ENSURE ENLIVE, (ENSURE ENLIVE) LIQD Take 237 mLs by mouth 3 (three) times daily between meals. Patient not taking: Reported on 08/06/2019 07/10/19   Loletha Grayer, MD  folic acid (FOLVITE) 1 MG tablet Take 1 mg by mouth daily. 02/25/19   [provider]  furosemide (LASIX) 40 MG tablet Take 40 mg by mouth daily. 01/17/19   [provider]  hydroxychloroquine (PLAQUENIL) 200 MG tablet Take 200 mg by mouth daily. 06/16/19   [provider]  lamoTRIgine (LAMICTAL) 200 MG tablet Take 200 mg by mouth 2 (two) times daily.    [provider]  lamoTRIgine (LAMICTAL) 25 MG tablet Take 25 mg by mouth 2 (two) times daily. 04/21/18  [provider]  magnesium oxide (MAG-OX) 400 MG tablet Take 400 mg by mouth daily.    [provider]  metoprolol tartrate (LOPRESSOR) 25 MG tablet Take 1 tablet (25 mg total) by mouth 2 (two) times daily. 07/10/19 07/09/20  Loletha Grayer, MD  nystatin (MYCOSTATIN) 100000 UNIT/ML suspension Take 5 mLs (500,000 Units total) by mouth 4 (four) times daily. Patient not taking: Reported on 08/06/2019 07/10/19   Loletha Grayer, MD  omeprazole (PRILOSEC) 40 MG capsule Take 40 mg by mouth daily as needed.     [provider]  polyethylene glycol (MIRALAX / GLYCOLAX) 17 g packet Take 17 g by mouth daily as needed. 07/10/19   Loletha Grayer, MD  predniSONE (DELTASONE) 5 MG tablet 4 tabs po day1, 3 tabs po day2; 2 tabs po  day3,4; 1 tab po day5,6; 1/2 tab po day7,8,9,10 Patient not taking: Reported on 08/06/2019 07/10/19   Loletha Grayer, MD  thiamine 100 MG tablet Take 1 tablet (100 mg total) by mouth daily. 04/27/19   [name], MD  traZODone (DESYREL) 50 MG tablet Take 50 mg by mouth at bedtime.    [provider]  zinc sulfate 220 (50 Zn) MG capsule Take 1 capsule (220 mg total) by mouth daily. 07/10/19   Loletha Grayer, MD    Allergies Cefoperazone, Penicillins, and Latex  Family History  Problem Relation Age of Onset  . Hypertension Other   . Hypertension Mother   . Dementia Mother   . Dementia Father   . Prostate cancer Neg Hx   . Bladder Cancer Neg Hx   . Kidney cancer Neg Hx     Social History Social History   Tobacco Use  . Smoking status: Former Smoker    Packs/day: 1.00    Years: 20.00    Pack years: 20.00    Types: Cigarettes  . Smokeless tobacco: Never Used  Substance Use Topics  . Alcohol use: No  . Drug use: Never     Review of Systems  Constitutional: No fever/chills.  Increased weakness.  Unable to stand even with the use of a lift Eyes: No visual changes. No discharge ENT: No upper respiratory complaints. Cardiovascular: no chest pain. Respiratory: no cough. No SOB. Gastrointestinal: No abdominal pain.  No nausea, no vomiting.  No diarrhea.  No constipation. Genitourinary: Negative for dysuria. No hematuria Musculoskeletal: Negative for musculoskeletal pain. Skin: Negative for rash, abrasions, lacerations, ecchymosis. Neurological: Negative for headaches, focal weakness or numbness. 10-point ROS otherwise negative.  ____________________________________________   PHYSICAL EXAM:  VITAL SIGNS: ED Triage Vitals  Enc Vitals Group     BP 08/22/19 1949 118/81     Pulse Rate 08/22/19 1949 89     Resp 08/22/19 1949 16     Temp 08/22/19 1949 97.6 F (36.4 C)     Temp Source 08/22/19 1949 Oral     SpO2 08/22/19 1949 96 %     Weight 08/22/19 1951 251 lb  1.6 oz (113.9 kg)     Height 08/22/19 1951 6\' 1"  (1.854 m)     Head Circumference --      Peak Flow --      Pain Score 08/22/19 1950 3     Pain Loc --      Pain Edu? --      Excl. in [place]? --      Constitutional: Alert and oriented. Well appearing and in no acute distress. Eyes: Conjunctivae are normal. PERRL. EOMI. Head: Atraumatic. ENT:  Ears:       Nose: No congestion/rhinnorhea.      Mouth/Throat: Mucous membranes are moist.  Neck: No stridor.  Hematological/Lymphatic/Immunilogical: No cervical lymphadenopathy. Cardiovascular: Normal rate, regular rhythm. Normal S1 and S2.  Good peripheral circulation. Respiratory: Normal respiratory effort without tachypnea or retractions. Lungs CTAB. Good air entry to the bases with no decreased or absent breath sounds. Gastrointestinal: Bowel sounds 4 quadrants. Soft and nontender to palpation. No guarding or rigidity. No palpable masses. No distention. No CVA tenderness. Musculoskeletal: Full range of motion to all extremities. No gross deformities appreciated. Neurologic:  Normal speech and language. No gross focal neurologic deficits are appreciated.  Cranial nerves II through XII grossly intact.  Negative pronator drift.  Unable to perform Romberg's at this time.  Patient has slight incoordination when attempting to touch my finger and back to his nose.  Patient is slightly off to the right. Skin:  Skin is warm, dry and intact. No rash noted. Psychiatric: Mood and affect are normal. Speech and behavior are normal. Patient exhibits appropriate insight and judgement.   ____________________________________________   LABS (all labs ordered are listed, but only abnormal results are displayed)  Labs Reviewed  CULTURE, BLOOD (ROUTINE X 2)  CULTURE, BLOOD (ROUTINE X 2)  SARS CORONAVIRUS 2 (TAT 6-24 HRS)  LACTIC ACID, PLASMA  LACTIC ACID, PLASMA  CBC WITH DIFFERENTIAL/PLATELET  URINALYSIS, COMPLETE (UACMP) WITH MICROSCOPIC  AMMONIA   CBC WITH DIFFERENTIAL/PLATELET  COMPREHENSIVE METABOLIC PANEL  TROPONIN I (HIGH SENSITIVITY)   ____________________________________________  EKG   ____________________________________________  RADIOLOGY   No results found.  ____________________________________________    PROCEDURES  Procedure(s) performed:    Procedures    Medications  sodium chloride 0.9 % bolus 1,000 mL (has no administration in time range)     ____________________________________________   INITIAL IMPRESSION / ASSESSMENT AND PLAN / ED COURSE  Pertinent labs & imaging results that were available during my care of the patient were reviewed by me and considered in my medical decision making (see chart for details).  Review of the [place] CSRS was performed in accordance of the [place] prior to dispensing any controlled drugs.          Prior to the return of results, patient care will be transferred to attending provider, Dr. Archie Balboa.  At this time patient presented with generalized weakness as well as some confusion and difficulty feeding himself.  According to the wife, patient has had multiple recent significant medical events.  She is unsure the exact nature of all of patient's medical problems.  It appears that patient may have had Covid, however the wife states that first there was a positive test, but a negative test followed by a positive test.  Patient was never symptomatic for COVID-19.  Today patient has been weak, unable to stand with the assistance of a lift.  This is a change from patient's baseline.  He has been also confused according to the wife.  Also according to the wife, when patient attempts to feed himself, he is missing his mouth.  Overall exam is reassuring.  Patient denies any complaints other than being weak.  During neuro exam, patient does miss my finger in his nose both to the right side.  Otherwise exam was reassuring.  Patient will have labs, imaging at this time.  Final  diagnosis and disposition will be provided by attending provider, Dr. Archie Balboa.   This chart was dictated using voice recognition software/Dragon. Despite best efforts to proofread, errors can occur  which can change the meaning. Any change was purely unintentional.    Darletta Moll, PA-C 08/22/19 2102    Nance Pear, MD 08/22/19 2105

## 2019-08-22 NOTE — ED Triage Notes (Signed)
Pt arrived from home via [facility].  Family reports 77 temp and increasing weakness, hx of sepsis and family did not want to wait until it got worse.    With EMS A&Ox4, cbg 130 vitals temp 98, HR 90, 95% RA, 131/69.  Covid positive test one month ago.  Swollen legs, family states they are better than they have been.  Upon arrival pt is A&Ox4, in no acute distress.

## 2019-08-22 NOTE — ED Notes (Signed)
Multiple RNs have attempted to place IV with no success.  IV team consult requested.

## 2019-08-23 ENCOUNTER — Emergency Department: Payer: Medicare PPO

## 2019-08-23 LAB — SARS CORONAVIRUS 2 (TAT 6-24 HRS): SARS Coronavirus 2: NEGATIVE

## 2019-08-23 LAB — URINALYSIS, COMPLETE (UACMP) WITH MICROSCOPIC
Bacteria, UA: NONE SEEN
Bilirubin Urine: NEGATIVE
Glucose, UA: NEGATIVE mg/dL
Hgb urine dipstick: NEGATIVE
Ketones, ur: NEGATIVE mg/dL
Leukocytes,Ua: NEGATIVE
Nitrite: NEGATIVE
Protein, ur: NEGATIVE mg/dL
Specific Gravity, Urine: 1.019 (ref 1.005–1.030)
pH: 6 (ref 5.0–8.0)

## 2019-08-23 MED ORDER — LORAZEPAM 2 MG/ML IJ SOLN
2.0000 mg | Freq: Once | INTRAMUSCULAR | Status: AC
  Administered 2019-08-23: 2 mg via INTRAVENOUS
  Filled 2019-08-23: qty 1

## 2019-08-23 NOTE — ED Notes (Addendum)
Patient used urinal with assistance; brief changed and patient dressed for discharge.  Chocolate milk and graham crackers provided.

## 2019-08-23 NOTE — ED Notes (Signed)
Pt transported to MRI 

## 2019-08-23 NOTE — Discharge Instructions (Addendum)
Your labs, CT scan of the head, and MRI of the brain were all okay today. We were unable to find a cause for your symptoms, but your evaluation is generally reassuring. Please follow up with your doctor on Monday for further assessment of your symptoms.  You should resume taking your furosemide fluid pill as prescribed to prevent worsening swelling of the legs.

## 2019-08-23 NOTE — ED Notes (Signed)
Patient's wife has gone home; bed alarm activated

## 2019-08-23 NOTE — ED Notes (Signed)
[facility] will not be able to pick patient up until after shift change.

## 2019-08-23 NOTE — ED Provider Notes (Signed)
Procedures     ----------------------------------------- 4:17 AM on 08/23/2019 ----------------------------------------- Labs, chest x-ray, CT head all unremarkable.  MRI brain obtained which is also unremarkable.  Vital signs remain normal.  Patient is in good spirits, conversant, eating.  I reexamined him, neurologically intact and nonfocal.  He is able to lift both legs very well without drift.  He does have 2+ pitting edema bilateral lower extremities without evidence of cellulitis.  Wife notes that she has been withholding his Lasix lately because of difficulty getting him up to the bathroom.  Recommended she resume the Lasix.  No follow-up with primary care on Monday for further assessment of his condition.  Final diagnoses:  Generalized weakness        Carrie Mew, MD 08/23/19 [phone]

## 2019-09-01 NOTE — Progress Notes (Incomplete)
[facility]  [address], [address] [place],  [address] Phone: [phone]  Fax: [phone]   Telemedicine Office Visit:  09/03/2019  Referring physician: Derinda Late, MD  I connected with Jeremiah [name]. on 09/03/2019 at 8:58 AM by videoconferencing and verified that I was speaking with the correct person using 2 identifiers.  The patient was at *** home.  I discussed the limitations, risk, security and privacy concerns of performing an evaluation and management service by videoconferencing and the availability of in person appointments.  I also discussed with the patient that there may be a patient responsible charge related to this service.  The patient expressed understanding and agreed to proceed.  Chief Complaint: Jeremiah Angelino. is a 79 y.o. male with rheumatoid arthritis previously on methotrexate and a history of pancytopenia who is seen for a 4 week assessment.   HPI: The patient was last seen in the medical oncology clinic on 08/07/2019 via telemedicine. At that time, he had significant pain and difficulty with mobility secondary to rheumatoid arthritis. I educated the patient on the importance of caloric intake. I referred patient to nutrition for consult. I encouraged a follow up with his [place] for management of his arthritis.   He was seen in the ER at [facility] for generalized weakness on 08/22/2019. Wife noted the patient appeared slight more confused. Neurologic exam was normal.  He had  2+ bilateral lower extremity pitting edema.  He had been withholding his Lasix secondary to difficulty getting up to the bathroom.  CBC revealed a hematocrit of 38.6, hemoglobin 12.4, MCV 81.3, platlets 179,000, WBC 5400 (ANC 3600; ALC 1100).  Albumen was 2.9.  Protein was 7.0.  Creatinine was 0.83.  CXR and head CT were all unremarkable. Head  MRI revealed no acute abnormality.  There was generalized atrophy and findings of chronic ischemic  microangiopathy. Patient recommended to resume Lasix.   During the interim, ***   Past Medical History:  Diagnosis Date  . Allergy   . Arthritis   . Collagen vascular disease ([place])   . Hypertension   . Rheumatoid arthritis ([place])   . Seizures ([place])   . Sepsis ([place]) 05/23/2015    Past Surgical History:  Procedure Laterality Date  . BACK SURGERY    . back surgery five times  04/2016  . CARPAL TUNNEL RELEASE    . HERNIA REPAIR    . JOINT REPLACEMENT    . knee replacement right    . PEG PLACEMENT N/A 04/24/2019   Procedure: PERCUTANEOUS ENDOSCOPIC GASTROSTOMY (PEG) PLACEMENT;  Surgeon: Toledo, Benay Pike, MD;  Location: [facility] ENDOSCOPY;  Service: Gastroenterology;  Laterality: N/A;  . right hip replacement    . rotator cuff surgery    . SPINE SURGERY      Family History  Problem Relation Age of Onset  . Hypertension Other   . Hypertension Mother   . Dementia Mother   . Dementia Father   . Prostate cancer Neg Hx   . Bladder Cancer Neg Hx   . Kidney cancer Neg Hx     Social History:  reports that he has quit smoking. His smoking use included cigarettes. He has a 20.00 pack-year smoking history. He has never used smokeless tobacco. He reports that he does not drink alcohol or use drugs. He denies any exposure to radiation or toxins. 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 He lives in [place] wife's name is [name]. The patient is ***alone/accompanied by *** today.  Participants in the patient's visit and their role in the encounter included the patient, ***, and Waymon Budge, RN or Vito Berger, CMA, today.  The intake visit was provided by *** Waymon Budge, RN or Vito Berger, [place].   Allergies:  Allergies  Allergen Reactions  . Cefoperazone Anaphylaxis  . Penicillins Anaphylaxis and Other (See Comments)    Has patient had a PCN reaction causing immediate rash,  facial/tongue/throat swelling, SOB or lightheadedness with hypotension: Yes Has patient had a PCN reaction causing severe rash involving mucus membranes or skin necrosis: No Has patient had a PCN reaction that required hospitalization No Has patient had a PCN reaction occurring within the last 10 years: No If all of the above answers are "NO", then may proceed with Cephalosporin use.  . Latex Rash    Current Medications: Current Outpatient Medications  Medication Sig Dispense Refill  . acetaminophen (TYLENOL) 325 MG tablet Take 2 tablets (650 mg total) by mouth every 6 (six) hours as needed for mild pain (or Fever >/= 101).    Marland Kitchen ascorbic acid (VITAMIN C) 500 MG tablet Take 1 tablet (500 mg total) by mouth daily. 30 tablet 0  . aspirin 81 MG chewable tablet Chew 81 mg by mouth daily.    . cholecalciferol (VITAMIN D) 25 MCG tablet Take 1 tablet (1,000 Units total) by mouth daily. 30 tablet 0  . feeding supplement, ENSURE ENLIVE, (ENSURE ENLIVE) LIQD Take 237 mLs by mouth 3 (three) times daily between meals. (Patient not taking: Reported on 08/06/2019) 08144 mL 0  . folic acid (FOLVITE) 1 MG tablet Take 1 mg by mouth daily.    . furosemide (LASIX) 40 MG tablet Take 40 mg by mouth daily.    . hydroxychloroquine (PLAQUENIL) 200 MG tablet Take 200 mg by mouth daily.    Marland Kitchen lamoTRIgine (LAMICTAL) 200 MG tablet Take 200 mg by mouth 2 (two) times daily.    Marland Kitchen lamoTRIgine (LAMICTAL) 25 MG tablet Take 25 mg by mouth 2 (two) times daily.    . magnesium oxide (MAG-OX) 400 MG tablet Take 400 mg by mouth daily.    . metoprolol tartrate (LOPRESSOR) 25 MG tablet Take 1 tablet (25 mg total) by mouth 2 (two) times daily. 60 tablet 11  . nystatin (MYCOSTATIN) 100000 UNIT/ML suspension Take 5 mLs (500,000 Units total) by mouth 4 (four) times daily. (Patient not taking: Reported on 08/06/2019) 60 mL 0  . omeprazole (PRILOSEC) 40 MG capsule Take 40 mg by mouth daily as needed.     . polyethylene glycol (MIRALAX /  GLYCOLAX) 17 g packet Take 17 g by mouth daily as needed. 14 each 0  . predniSONE (DELTASONE) 5 MG tablet 4 tabs po day1, 3 tabs po day2; 2 tabs po day3,4; 1 tab po day5,6; 1/2 tab po day7,8,9,10 (Patient not taking: Reported on 08/06/2019) 15 tablet 0  . thiamine 100 MG tablet Take 1 tablet (100 mg total) by mouth daily. 30 tablet 0  . traZODone (DESYREL) 50 MG tablet Take 50 mg by mouth at bedtime.    Marland Kitchen zinc sulfate 220 (50 Zn) MG capsule Take 1 capsule (220 mg total) by mouth daily. 30 capsule 0   No current facility-administered medications for this visit.    Review of Systems  Constitutional: Positive for malaise/fatigue and weight loss (25 pounds; up 1 pound since being home). Negative for chills, diaphoresis  and fever.       Improved health since being home.  HENT: Negative for congestion, ear pain, nosebleeds, sinus pain and sore throat.   Eyes: Negative.  Negative for blurred vision and double vision.  Respiratory: Negative.  Negative for cough, hemoptysis, sputum production and shortness of breath.   Cardiovascular: Negative.  Negative for chest pain, palpitations and leg swelling.  Gastrointestinal: Positive for constipation (occasionally). Negative for abdominal pain, blood in stool, diarrhea, heartburn, nausea and vomiting.       Appetite is good.  Genitourinary: Negative.  Negative for dysuria, frequency, hematuria and urgency.  Musculoskeletal: Positive for joint pain (rheumatoid and osteoarthritis). Negative for back pain, falls and myalgias.  Skin: Negative.  Negative for rash.  Neurological: Negative.  Negative for dizziness, sensory change, speech change, focal weakness, seizures, weakness (generalized) and headaches.       Family uses a lift for assistance with moving.  Endo/Heme/Allergies: Negative.  Does not bruise/bleed easily.  Psychiatric/Behavioral: Negative.  Negative for depression and memory loss. The patient is not nervous/anxious and does not have insomnia.     All other systems reviewed and are negative.   Performance status (ECOG): 3   Physical Exam  Constitutional: He is oriented to person, place, and time. He appears well-developed and well-nourished. No distress.  Patient sitting comfortably in a lounge chair at home in no acute distress.  HENT:  Head: Normocephalic and atraumatic.  Mouth/Throat: Oropharynx is clear and moist. No oropharyngeal exudate.  Short hair.  Male pattern baldness.  Eyes: Conjunctivae and EOM are normal. No scleral icterus.  Neurological: He is alert and oriented to person, place, and time.  Skin: He is not diaphoretic.  Psychiatric: He has a normal mood and affect. His behavior is normal. Judgment and thought content normal.  Nursing note reviewed.    No visits with results within 3 Day(s) from this visit.  Latest known visit with results is:  Admission on 08/22/2019, Discharged on 08/23/2019  Component Date Value Ref Range Status  . Lactic Acid, Venous 08/22/2019 1.1  0.5 - 1.9 mmol/L Final   Performed at [facility], [address] [place], [place] [address]  . Color, Urine 08/22/2019 YELLOW* YELLOW Final  . APPearance 08/22/2019 CLEAR* CLEAR Final  . Specific Gravity, Urine 08/22/2019 1.019  1.005 - 1.030 Final  . pH 08/22/2019 6.0  5.0 - 8.0 Final  . Glucose, UA 08/22/2019 NEGATIVE  NEGATIVE mg/dL Final  . Hgb urine dipstick 08/22/2019 NEGATIVE  NEGATIVE Final  . Bilirubin Urine 08/22/2019 NEGATIVE  NEGATIVE Final  . Ketones, ur 08/22/2019 NEGATIVE  NEGATIVE mg/dL Final  . Protein, ur 08/22/2019 NEGATIVE  NEGATIVE mg/dL Final  . Nitrite 08/22/2019 NEGATIVE  NEGATIVE Final  . Chalmers Guest 08/22/2019 NEGATIVE  NEGATIVE Final  . RBC / HPF 08/22/2019 0-5  0 - 5 RBC/hpf Final  . WBC, UA 08/22/2019 0-5  0 - 5 WBC/hpf Final  . Bacteria, UA 08/22/2019 NONE SEEN  NONE SEEN Final  . Squamous Epithelial / LPF 08/22/2019 0-5  0 - 5 Final  . Mucus 08/22/2019 PRESENT   Final   Performed at  [facility], [address], [place], [place] [address]  . Specimen Description 08/22/2019 BLOOD LEFT ARM   Final  . Special Requests 08/22/2019 BOTTLES DRAWN AEROBIC AND ANAEROBIC   Final  . Culture 08/22/2019    Final                   Value:NO GROWTH 4  DAYS Performed at [facility], [address], [place], [place] [address]   . Report Status 08/22/2019 PENDING   Incomplete  . Specimen Description 08/22/2019 BLOOD LEFT WRIST   Final  . Special Requests 08/22/2019 BOTTLES DRAWN AEROBIC AND ANAEROBIC   Final  . Culture 08/22/2019    Final                   Value:NO GROWTH 4 DAYS Performed at [facility], [address] [place], [place] [address]   . Report Status 08/22/2019 PENDING   Incomplete  . SARS Coronavirus 2 08/22/2019 NEGATIVE  NEGATIVE Final   Comment: (NOTE) SARS-CoV-2 target nucleic acids are NOT DETECTED. The SARS-CoV-2 RNA is generally detectable in upper and lower respiratory specimens during the acute phase of infection. Negative results do not preclude SARS-CoV-2 infection, do not rule out co-infections with other pathogens, and should not be used as the sole basis for treatment or other patient management decisions. Negative results must be combined with clinical observations, patient history, and epidemiological information. The expected result is Negative. Fact Sheet for Patients: SugarRoll.be Fact Sheet for Healthcare Providers: https://www.woods-mathews.com/ This test is not yet approved or cleared by the [place] FDA and  has been authorized for detection and/or diagnosis of SARS-CoV-2 by FDA under an Emergency Use Authorization (EUA). This EUA will remain  in effect (meaning this test can be used) for the duration of the COVID-19 declaration under Section 56                          4(b)(1) of the Act, 21 U.S.C. section 360bbb-3(b)(1), unless the authorization is terminated  or revoked sooner. Performed at [facility], [place] [address], [place], [place] [address]   . Ammonia 08/22/2019 25  9 - 35 umol/L Final   Performed at [facility], [address] [place], [place] [address]  . WBC 08/22/2019 5.4  4.0 - 10.5 K/uL Final  . RBC 08/22/2019 4.75  4.22 - 5.81 MIL/uL Final  . Hemoglobin 08/22/2019 12.4* 13.0 - 17.0 g/dL Final  . HCT 08/22/2019 38.6* 39.0 - 52.0 % Final  . MCV 08/22/2019 81.3  80.0 - 100.0 fL Final  . MCH 08/22/2019 26.1  26.0 - 34.0 pg Final  . MCHC 08/22/2019 32.1  30.0 - 36.0 g/dL Final  . RDW 08/22/2019 17.4* 11.5 - 15.5 % Final  . Platelets 08/22/2019 179  150 - 400 K/uL Final  . nRBC 08/22/2019 0.0  0.0 - 0.2 % Final  . Neutrophils Relative % 08/22/2019 65  % Final  . Neutro Abs 08/22/2019 3.6  1.7 - 7.7 K/uL Final  . Lymphocytes Relative 08/22/2019 20  % Final  . Lymphs Abs 08/22/2019 1.1  0.7 - 4.0 K/uL Final  . Monocytes Relative 08/22/2019 10  % Final  . Monocytes Absolute 08/22/2019 0.5  0.1 - 1.0 K/uL Final  . Eosinophils Relative 08/22/2019 4  % Final  . Eosinophils Absolute 08/22/2019 0.2  0.0 - 0.5 K/uL Final  . Basophils Relative 08/22/2019 1  % Final  . Basophils Absolute 08/22/2019 0.0  0.0 - 0.1 K/uL Final  . Immature Granulocytes 08/22/2019 0  % Final  . Abs Immature Granulocytes 08/22/2019 0.01  0.00 - 0.07 K/uL Final   Performed at [facility], [address], [place], [place] [address]  . Sodium 08/22/2019 140  135 - 145 mmol/L Final  . Potassium 08/22/2019 3.8  3.5 - 5.1 mmol/L Final  . Chloride 08/22/2019 105  98 - 111 mmol/L Final  . CO2 08/22/2019 28  22 - 32 mmol/L Final  . Glucose, Bld 08/22/2019 84  70 - 99 mg/dL Final   Glucose reference range applies only to samples taken after fasting for at least 8 hours.  . BUN 08/22/2019 14  8 - 23 mg/dL Final  . Creatinine, Ser 08/22/2019 0.83  0.61 - 1.24 mg/dL Final  . Calcium 08/22/2019 9.2  8.9 - 10.3 mg/dL Final  . Total Protein  08/22/2019 7.0  6.5 - 8.1 g/dL Final  . Albumin 08/22/2019 2.9* 3.5 - 5.0 g/dL Final  . AST 08/22/2019 13* 15 - 41 U/L Final  . ALT 08/22/2019 8  0 - 44 U/L Final  . Alkaline Phosphatase 08/22/2019 124  38 - 126 U/L Final  . Total Bilirubin 08/22/2019 0.4  0.3 - 1.2 mg/dL Final  . GFR calc non Af Amer 08/22/2019 >60  >60 mL/min Final  . GFR calc Af Amer 08/22/2019 >60  >60 mL/min Final  . Anion gap 08/22/2019 7  5 - 15 Final   Performed at [facility], [address], [place], [place] [address]  . Troponin I (High Sensitivity) 08/22/2019 9  <18 ng/L Final   Comment: (NOTE) Elevated high sensitivity troponin I (hsTnI) values and significant  changes across serial measurements may suggest ACS but many other  chronic and acute conditions are known to elevate hsTnI results.  Refer to the "Links" section for chest pain algorithms and additional  guidance. Performed at [facility], [address] [place], [place] [address]     Assessment:  Mort Smelser. is a 79 y.o. male withrheumatoid arthritisand a history of pancytopenia s/p methotrexate toxicityin 03/2019. He had not been taking folic acid. Hehadfolic acid deficiencyand wasstarted on oral folate. Nadir counts on 04/05/2019 revealed a hematocrit of 27.0, hemoglobin 8.9, MCV 85.2, platelets 81,000, and WBC 1000 (ANC 200). He received GCSF (Granix) from 04/11/2019 - 04/14/2019. Counts on 04/24/2019 revealed a hematocrit of 26.6, hemoglobin 8.6, platelets 570,000, and WBC 15,200.  Peripheral blood flow cytometryon 04/09/2020 revealed 1% myeloblasts in the setting of pancytopenia and phenotypic aberrancies of neutrophils and monocytes. There was a CD5-, CD10- clonal B cell population, non specific phenotype, 2% of leukocytes (<5000/uL) c/w amonoclonal B-cell lymphocytosis.  Bone marrow on 04/10/2019 revealed ahypercellular marrow with erythroid hyperplasia, rare ringed sideroblasts and dyspoiesis.  There was polytic plasmacytosis (6-10%).The plasma cells coexpressed CD56(unusual)but appeared polytypic by kappa and lambda in situ hybridization.There were no increased blasts. Cytogenetics were normal (43, XX).Bone marrow findings were felt likely related to methotrexateor possibly a myelodysplastic syndrome (MDS).  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 MYD88 L265P can be seen in lymphoplasmacytic lymphoma/Wldenstrom's macroglobulinemia, IgM MGUS, CLL, marginal zone lymphoma, splenic lymphoma, and some non-germinal center subtype diffuse large B celll lymphomas.  Flow cytometry on 07/09/2019 revealed a CD5, CD10, and CD103 negative monoclonal B cell population , non specific phenotype, <1% of leukocytes, < 5,000/uL.  This represent a monoclonal B-cell lymphocytosis.  This is not the typical phenotype for CLL, hairy cell leukemia, or follicular center cell lymphoma.  SPEP on 07/09/2019 revealed no monoclonal protein and normal immunoglobulins.  He last received methotrexateon 06/22/2019 and 06/29/2019.  He has a history of folic acid deficiency.   Folate was 3.4 on 04/06/2019, 11.5 on 04/22/2019, and 11.4 on 07/08/2019.  He was admitted to [facility]  from 07/02/2019 to 07/10/2019 with weakness, fever of 102.1, chills and acute encephalopathy. He was diffusely painful to touch. COVID-19 testing was positive by PCR. Influenza A and B were negative. Blood cultures were negative. Urine cultures grew multiple species.  He received broad spectrum antibiotics and remdesivir.  He was treated with Solumedrol for a rheumatoid flare and discharged on a slow prednisone taper.  He received IV then oral folic acid.  Chest CT angiogramon 07/05/2019 revealed no pulmonary embolism. There were no enlarged mediastinal, hilar or axillary adenopathy.  Bilateral lower extremity duplex on 07/05/2019 showed no evidence of DVT in either lower  extremity.  Symptomatically, ***  Plan: 1.   Review labs from 08/22/2019.  2.   Monoclonal B-cell lymphocytosis             Significance is unclear.             He may have a monoclonal B cell lymphocytosis of unknown significance or possibly an occult lymphoma.             Next generation sequencing revealedMYD88 L265Pwhich could be related to: IgMMGUSor possiblyan occult lymphoma (lymphoplasmacytic or marginal zone).             SPEP revealed no monoclonal protein.  IgM, IgG, and IgA were normal.             Chest CT angiogram on 07/05/2019 revealed no adenopathy.             His current symptoms appear to be related to his rheumatoid arthritis (not treated).             Discuss consideration of abdomen and pelvis CT if patient does not improve. 3.   Folic acid deficiency             Patient initially developed folic acid deficiency when not taking supplementation on methotrexate.             Folate was 11.4 on 07/08/2019.             Continue to monitor. 4.   Weight loss             He has gained 1 pound since being home this week.             Discuss importance of caloric intake.            Nutrition consult. 5.   Rheumatoid arthritis             Encourage follow-up with his [place]. 6.   Patient to have labs drawn by home health care. 7.   RTC in 3-4 weeks for MD assessment (in person or WebEx).   I discussed the assessment and treatment plan with the patient.  The patient was provided an opportunity to ask questions and all were answered.  The patient agreed with the plan and demonstrated an understanding of the instructions.  The patient was advised to call back if the symptoms worsen or if the condition fails to improve as anticipated.  I provided *** minutes (8:59 AM - x:xx) of face-to-face video visit time during this this encounter and > 50% was spent counseling as documented under my assessment and plan.  I provided these services from the  [facility] office ***.    Lequita Asal, MD, PhD    09/01/2019, 12:43 PM  I, Selena Batten, am acting as scribe for [employer]. Mike Gip, MD, PhD.  {Add scribe attestation statement}

## 2019-09-03 ENCOUNTER — Inpatient Hospital Stay: Payer: Medicare PPO | Attending: Hematology and Oncology | Admitting: Hematology and Oncology

## 2019-09-03 ENCOUNTER — Inpatient Hospital Stay: Payer: Medicare PPO

## 2019-09-03 DIAGNOSIS — D72820 Lymphocytosis (symptomatic): Secondary | ICD-10-CM

## 2019-09-03 DIAGNOSIS — E538 Deficiency of other specified B group vitamins: Secondary | ICD-10-CM

## 2019-09-03 NOTE — Progress Notes (Signed)
Nutrition Assessment   Reason for Assessment:  Referral from Dr Mike Gip for weight loss.    ASSESSMENT:  79 year old male with rheumatoid arthritis previously on methotrexate and history of pancytopenia.  Noted multiple recent hospital admission/ED visits requiring PEG placement on 04/24/2019 removed on 1/21.  Noted covid + during hospital admission.   Past medical history of HTN, RA, seizures.  Called at scheduled phone appointment time but unable to reach patient/family.   Called back at 3:24 pm and was able to speak with wife Jeremiah [name].  Reports that his appetite is better since coming out of the hospital.  Patient is still weak and unable to walk around house without assistance.  Patient can't come to appointments as family can't get him in the car.  PT/OT is working with patient per wife.    Wife reports that during hospital admission he lost his dentures and can't get to the dentist right now to have new ones made.  He is eating cooked sausage with eggs for breakfast or oatmeal or grits. Lunch is sandwich or this week wife made tuna salad and he has been eating with crackers.  Last night wife made spaghetti with meat sauce that patient was able to eat.  Patient likes peanut butter crackers, applesauce. He has not drank oral nutrition supplements.  Wife reports that he is drinking water mostly.     Medications: reviewed   Labs: reviewed   Anthropometrics:   Height: 73 inches Weight: wife thinks 240 lb noted ED visit 251 lb on 4/3 UBW: wife thinks this is about UBW BMI: 33  Noted 2/17 234 lb 04/24/19 230lb   Estimated Energy Needs  Kcals: 2400-2700 Protein: 120-135 g Fluid: > 2.4 L   NUTRITION DIAGNOSIS: Increased nutrient needs related to recent hospital admissions, weakness as evidenced by calorie needs   INTERVENTION:  Discussed importance of high calorie, high protein foods.   Encouraged continued work with PT/OT Discussed oral nutrition supplements and recommend  350+ calorie shakes at this time.   Discussed chopping, grinding foods, adding gravy, sauces due to lack of teeth at this time.  Provided wife with contact number   MONITORING, EVALUATION, GOAL: patient will consume adequate calories and protein to prevent further weight loss   Next Visit: May 6 phone f/u  Dione Petron B. Zenia Resides, [place], [place] Registered Dietitian [phone] (pager)

## 2019-09-04 LAB — CULTURE, BLOOD (ROUTINE X 2)
Culture: NO GROWTH
Culture: NO GROWTH

## 2019-09-05 ENCOUNTER — Other Ambulatory Visit: Payer: Self-pay | Admitting: Nurse Practitioner

## 2019-09-05 DIAGNOSIS — M792 Neuralgia and neuritis, unspecified: Secondary | ICD-10-CM

## 2019-09-24 ENCOUNTER — Inpatient Hospital Stay: Payer: Medicare PPO | Attending: Hematology and Oncology

## 2019-09-24 ENCOUNTER — Other Ambulatory Visit: Payer: Self-pay

## 2019-09-24 NOTE — Progress Notes (Signed)
Nutrition Follow-up:  Patient with rheumatoid arthritis previously on methotrexate and pancytopenia.  Followed by Dr. Mike Gip.    Called and spoke with wife for nutrition follow-up.  Wife reports that appetite is "really good".  States that he is eating well and she tries to fix him healthy foods with lots of protein.  Wife is still not able to get him in the car to appointment or to get new dentures.  She is fixing him soft foods (hamburger, eggs, pinto beans, cooked vegetables, oatmeal, etc).  [facility]orts [facility] following [facility]  Denies nutrition impact symptoms  Medications: reviewed  Labs: no new  Anthropometrics:   No new weight   NUTRITION DIAGNOSIS: Increased nutrient needs continue   INTERVENTION:  Reviewed with wife importance of continued well-balanced diet including good sources of protein.  Wife has contact information and will reach out to RD if needed in the future    NEXT VISIT: no follow-up, wife to call RD if needed  Chantia Amalfitano B. Zenia Resides, [place], [place] Registered Dietitian [phone] (pager)

## 2019-12-23 NOTE — Progress Notes (Signed)
[facility]  [address] [place], [place] [address] Phone: [phone]  Fax: [phone]   Clinic Day:  12/24/2019  Referring physician: Derinda Late, MD  Chief Complaint: Jeremiah Stracke. is a 79 y.o. male with rheumatoid arthritis previously on methotrexate and a history of pancytopenia who is seen for reassessment.  HPI: The patient was last seen in the hematology clinic on 08/07/2019 via telemedicine. At that time, he had significant pain and difficulty with mobility secondary to rheumatoid arthritis.    The patient was seen in the [facility] ER on 08/22/2019 for weakness. Hematocrit was 38.6, hemoglobin 12.4, MCV 81.3, platelets 179,000, WBC 5,400. Chest x ray, head CT, and brain MRI were unremarkable. He was instructed to restart Lasix for leg swelling and was discharged home.  The patient followed up with Jennet Maduro, RD on 09/24/2019. His wife reported that he has been eating very well. They reviewed the importance of a well-balanced diet with good sources of protein. Wife will reach out again prn.  The patient saw Dr. Ephriam Jenkins in the [facility] rheumatology clinic on 12/10/2019 for an initial consult. He was having pain in his hands, elbows, and knees. He restarted Plaquenil 200 mg BID and was prescribed Arava 10 mg daily and prednisone.  He is a follow-up in 2 months.  Labs on 12/10/2019 revealed a hematocrit of 36.2, hemoglobin 11.3, MCV 77.5, platelets 237,000, WBC 6400 with an [place] of 3670.  Creatinine was 1.1.  Albumin was 3.2, AST 9, ALT 6, bilirubin 0.5 and alkaline phosphatase 138 (34-104).  Uric acid was 5.2.  Hepatitis B and C serologies were negative.  During the interim, he has felt better. He is stronger and is able to stand up if he has something to hold on to. He was happy to get out of the [facility] at the end of 2020. He is seeing Dr. Posey Pronto in the [facility] rheumatology clinic.  The patient notes that he has lost some weight. He  eats small breakfasts and lunches and eats a lot of food at dinner. He has tolerated oral iron well in the past.  His wife states that he is due for a colonoscopy. EGD on 04/24/2019 was negative.   Past Medical History:  Diagnosis Date  . Allergy   . Arthritis   . Collagen vascular disease ([place])   . Hypertension   . Rheumatoid arthritis ([place])   . Seizures ([place])   . Sepsis ([place]) 05/23/2015    Past Surgical History:  Procedure Laterality Date  . BACK SURGERY    . back surgery five times  04/2016  . CARPAL TUNNEL RELEASE    . HERNIA REPAIR    . JOINT REPLACEMENT    . knee replacement right    . PEG PLACEMENT N/A 04/24/2019   Procedure: PERCUTANEOUS ENDOSCOPIC GASTROSTOMY (PEG) PLACEMENT;  Surgeon: Toledo, Benay Pike, MD;  Location: [facility] ENDOSCOPY;  Service: Gastroenterology;  Laterality: N/A;  . right hip replacement    . rotator cuff surgery    . SPINE SURGERY      Family History  Problem Relation Age of Onset  . Hypertension Other   . Hypertension Mother   . Dementia Mother   . Dementia Father   . Prostate cancer Neg Hx   . Bladder Cancer Neg Hx   . Kidney cancer Neg Hx     Social History:  reports that he has quit smoking. His smoking use included cigarettes. He has a 20.00 pack-year  smoking history. He has never used smokeless tobacco. He reports that he does not drink alcohol and does not use drugs.  He denies any exposure to radiation or toxins.  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  He lives in [place].  His wife's name is [name]. The patient is accompanied by his wife, [name], and his brother, Annie Main, by iPad today.   Allergies:  Allergies  Allergen Reactions  . Cefoperazone Anaphylaxis  . Penicillins Anaphylaxis and Other (See Comments)    Has patient had a PCN reaction causing immediate rash, facial/tongue/throat swelling, SOB or lightheadedness with hypotension: Yes Has  patient had a PCN reaction causing severe rash involving mucus membranes or skin necrosis: No Has patient had a PCN reaction that required hospitalization No Has patient had a PCN reaction occurring within the last 10 years: No If all of the above answers are "NO", then may proceed with Cephalosporin use.  . Latex Rash    Current Medications: Current Outpatient Medications  Medication Sig Dispense Refill  . acetaminophen (TYLENOL) 325 MG tablet Take 2 tablets (650 mg total) by mouth every 6 (six) hours as needed for mild pain (or Fever >/= 101).    Marland Kitchen aspirin 81 MG chewable tablet Chew 81 mg by mouth daily.    . cholecalciferol (VITAMIN D) 25 MCG tablet Take 1 tablet (1,000 Units total) by mouth daily. 30 tablet 0  . folic acid (FOLVITE) 1 MG tablet Take 1 mg by mouth daily.    . furosemide (LASIX) 40 MG tablet Take 40 mg by mouth daily.    Marland Kitchen gabapentin (NEURONTIN) 300 MG capsule TAKE 1 CAPSULE (300 MG TOTAL) BY MOUTH 4 (FOUR) TIMES DAILY. QHS    . hydroxychloroquine (PLAQUENIL) 200 MG tablet Take 200 mg by mouth daily.    Marland Kitchen lamoTRIgine (LAMICTAL) 200 MG tablet Take 200 mg by mouth 2 (two) times daily.    Marland Kitchen lamoTRIgine (LAMICTAL) 25 MG tablet Take 25 mg by mouth 2 (two) times daily.    . magnesium oxide (MAG-OX) 400 MG tablet Take 400 mg by mouth daily.    Marland Kitchen omeprazole (PRILOSEC) 40 MG capsule Take 40 mg by mouth daily as needed.     . predniSONE (DELTASONE) 5 MG tablet 4 tabs po day1, 3 tabs po day2; 2 tabs po day3,4; 1 tab po day5,6; 1/2 tab po day7,8,9,10 15 tablet 0  . thiamine 100 MG tablet Take 1 tablet (100 mg total) by mouth daily. 30 tablet 0  . traZODone (DESYREL) 50 MG tablet Take 50 mg by mouth at bedtime.    Marland Kitchen ascorbic acid (VITAMIN C) 500 MG tablet Take 1 tablet (500 mg total) by mouth daily. (Patient not taking: Reported on 12/24/2019) 30 tablet 0  . feeding supplement, ENSURE ENLIVE, (ENSURE ENLIVE) LIQD Take 237 mLs by mouth 3 (three) times daily between meals. (Patient not  taking: Reported on 08/06/2019) 21330 mL 0  . leflunomide (ARAVA) 10 MG tablet     . metoprolol tartrate (LOPRESSOR) 25 MG tablet Take 1 tablet (25 mg total) by mouth 2 (two) times daily. (Patient not taking: Reported on 12/24/2019) 60 tablet 11  . nystatin (MYCOSTATIN) 100000 UNIT/ML suspension Take 5 mLs (500,000 Units total) by mouth 4 (four) times daily. (Patient not taking: Reported on 08/06/2019) 60 mL 0  . polyethylene glycol (MIRALAX / GLYCOLAX) 17 g packet Take 17 g by mouth  daily as needed. (Patient not taking: Reported on 12/24/2019) 14 each 0  . zinc sulfate 220 (50 Zn) MG capsule Take 1 capsule (220 mg total) by mouth daily. (Patient not taking: Reported on 12/24/2019) 30 capsule 0   No current facility-administered medications for this visit.    Review of Systems  Constitutional: Positive for weight loss (43 lbs since 08/22/2019). Negative for chills, diaphoresis, fever and malaise/fatigue.       Feels "better."  HENT: Negative for congestion, ear discharge, ear pain, hearing loss, nosebleeds, sinus pain, sore throat and tinnitus.   Eyes: Negative.  Negative for blurred vision and double vision.  Respiratory: Negative.  Negative for cough, hemoptysis, sputum production and shortness of breath.   Cardiovascular: Negative.  Negative for chest pain, palpitations and leg swelling.  Gastrointestinal: Negative for abdominal pain, blood in stool, constipation, diarrhea, heartburn, melena, nausea and vomiting.       Eats small breakfasts and lunches and large dinners.  Genitourinary: Negative.  Negative for dysuria, frequency, hematuria and urgency.  Musculoskeletal: Positive for joint pain (rheumatoid and osteoarthritis). Negative for back pain, falls, myalgias and neck pain.  Skin: Negative.  Negative for rash.  Neurological: Negative.  Negative for dizziness, sensory change, speech change, focal weakness, seizures, weakness and headaches.  Endo/Heme/Allergies: Negative.  Does not bruise/bleed  easily.  Psychiatric/Behavioral: Negative.  Negative for depression and memory loss. The patient is not nervous/anxious and does not have insomnia.   All other systems reviewed and are negative.  Performance status (ECOG):  2  Vitals Blood pressure 127/78, pulse 86, temperature 97.6 F (36.4 C), temperature source Tympanic, weight 208 lb 5.4 oz (94.5 kg), SpO2 99 %.   Physical Exam Vitals and nursing note reviewed.  Constitutional:      General: He is not in acute distress.    Appearance: He is well-developed.     Comments: Patient was examined in a wheelchair.  HENT:     Head: Normocephalic and atraumatic.     Comments: Short gray hair with male pattern baldness.    Mouth/Throat:     Mouth: Mucous membranes are moist.     Pharynx: Oropharynx is clear.  Eyes:     General: No scleral icterus.    Extraocular Movements: Extraocular movements intact.     Conjunctiva/sclera: Conjunctivae normal.     Pupils: Pupils are equal, round, and reactive to light.     Comments: Glasses.  Blue eyes.  Cardiovascular:     Rate and Rhythm: Normal rate and regular rhythm.     Heart sounds: Normal heart sounds. No murmur heard.   Pulmonary:     Effort: Pulmonary effort is normal. No respiratory distress.     Breath sounds: Normal breath sounds. No wheezing or rales.  Chest:     Chest wall: No tenderness.  Abdominal:     General: Bowel sounds are normal. There is no distension.     Palpations: Abdomen is soft. There is no hepatomegaly, splenomegaly or mass.     Tenderness: There is no abdominal tenderness. There is no guarding or rebound.  Musculoskeletal:        General: No swelling or tenderness. Normal range of motion.     Cervical back: Normal range of motion and neck supple.     Right lower leg: Edema present.     Left lower leg: Edema present.  Lymphadenopathy:     Head:     Right side of head: No preauricular, posterior auricular or occipital adenopathy.  Left side of head: No  preauricular, posterior auricular or occipital adenopathy.     Cervical: No cervical adenopathy.     Upper Body:     Right upper body: No supraclavicular adenopathy.     Left upper body: No supraclavicular adenopathy.     Lower Body: No right inguinal adenopathy. No left inguinal adenopathy.  Skin:    General: Skin is warm and dry.  Neurological:     Mental Status: He is alert and oriented to person, place, and time.  Psychiatric:        Behavior: Behavior normal.        Thought Content: Thought content normal.        Judgment: Judgment normal.     No visits with results within 3 Day(s) from this visit.  Latest known visit with results is:  Admission on 08/22/2019, Discharged on 08/23/2019  Component Date Value Ref Range Status  . Lactic Acid, Venous 08/22/2019 1.1  0.5 - 1.9 mmol/L Final   Performed at [facility], [address] [place], [place] [address]  . Color, Urine 08/22/2019 YELLOW* YELLOW Final  . APPearance 08/22/2019 CLEAR* CLEAR Final  . Specific Gravity, Urine 08/22/2019 1.019  1.005 - 1.030 Final  . pH 08/22/2019 6.0  5.0 - 8.0 Final  . Glucose, UA 08/22/2019 NEGATIVE  NEGATIVE mg/dL Final  . Hgb urine dipstick 08/22/2019 NEGATIVE  NEGATIVE Final  . Bilirubin Urine 08/22/2019 NEGATIVE  NEGATIVE Final  . Ketones, ur 08/22/2019 NEGATIVE  NEGATIVE mg/dL Final  . Protein, ur 08/22/2019 NEGATIVE  NEGATIVE mg/dL Final  . Nitrite 08/22/2019 NEGATIVE  NEGATIVE Final  . Chalmers Guest 08/22/2019 NEGATIVE  NEGATIVE Final  . RBC / HPF 08/22/2019 0-5  0 - 5 RBC/hpf Final  . WBC, UA 08/22/2019 0-5  0 - 5 WBC/hpf Final  . Bacteria, UA 08/22/2019 NONE SEEN  NONE SEEN Final  . Squamous Epithelial / LPF 08/22/2019 0-5  0 - 5 Final  . Mucus 08/22/2019 PRESENT   Final   Performed at [facility], [address] [place], [place] [address]  . Specimen Description 08/22/2019 BLOOD LEFT ARM   Final  . Special Requests 08/22/2019 BOTTLES DRAWN AEROBIC AND  ANAEROBIC   Final  . Culture 08/22/2019    Final                   Value:NO GROWTH 5 DAYS Performed at [facility], [address] [place], [place] [address]   . Report Status 08/22/2019 09/04/2019 FINAL   Final  . Specimen Description 08/22/2019 BLOOD LEFT WRIST   Final  . Special Requests 08/22/2019 BOTTLES DRAWN AEROBIC AND ANAEROBIC   Final  . Culture 08/22/2019    Final                   Value:NO GROWTH 5 DAYS Performed at [facility], [address], [place], [place] [address]   . Report Status 08/22/2019 09/04/2019 FINAL   Final  . SARS Coronavirus 2 08/22/2019 NEGATIVE  NEGATIVE Final   Comment: (NOTE) SARS-CoV-2 target nucleic acids are NOT DETECTED. The SARS-CoV-2 RNA is generally detectable in upper and lower respiratory specimens during the acute phase of infection. Negative results do not preclude SARS-CoV-2 infection, do not rule out co-infections with other pathogens, and should not be used as the sole basis for treatment or other patient management decisions. Negative results must be combined with clinical observations, patient history, and epidemiological information. The expected result is  Negative. Fact Sheet for Patients: HairSlick.no Fact Sheet for Healthcare Providers: quierodirigir.com This test is not yet approved or cleared by the [place] FDA and  has been authorized for detection and/or diagnosis of SARS-CoV-2 by FDA under an Emergency Use Authorization (EUA). This EUA will remain  in effect (meaning this test can be used) for the duration of the COVID-19 declaration under Section 56                          4(b)(1) of the Act, 21 U.S.C. section 360bbb-3(b)(1), unless the authorization is terminated or revoked sooner. Performed at [facility], [address] [place], [place] [address]   . Ammonia 08/22/2019 25  9 - 35 umol/L Final   Performed at [facility], [address] [place] [place], [place] [address]  . WBC 08/22/2019 5.4  4.0 - 10.5 K/uL Final  . RBC 08/22/2019 4.75  4.22 - 5.81 MIL/uL Final  . Hemoglobin 08/22/2019 12.4* 13.0 - 17.0 g/dL Final  . HCT 56/34/6958 38.6* 39 - 52 % Final  . MCV 08/22/2019 81.3  80.0 - 100.0 fL Final  . MCH 08/22/2019 26.1  26.0 - 34.0 pg Final  . MCHC 08/22/2019 32.1  30.0 - 36.0 g/dL Final  . RDW 48/74/2549 17.4* 11.5 - 15.5 % Final  . Platelets 08/22/2019 179  150 - 400 K/uL Final  . nRBC 08/22/2019 0.0  0.0 - 0.2 % Final  . Neutrophils Relative % 08/22/2019 65  % Final  . Neutro Abs 08/22/2019 3.6  1.7 - 7.7 K/uL Final  . Lymphocytes Relative 08/22/2019 20  % Final  . Lymphs Abs 08/22/2019 1.1  0.7 - 4.0 K/uL Final  . Monocytes Relative 08/22/2019 10  % Final  . Monocytes Absolute 08/22/2019 0.5  0 - 1 K/uL Final  . Eosinophils Relative 08/22/2019 4  % Final  . Eosinophils Absolute 08/22/2019 0.2  0 - 0 K/uL Final  . Basophils Relative 08/22/2019 1  % Final  . Basophils Absolute 08/22/2019 0.0  0 - 0 K/uL Final  . Immature Granulocytes 08/22/2019 0  % Final  . Abs Immature Granulocytes 08/22/2019 0.01  0.00 - 0.07 K/uL Final   Performed at [facility], [address], [place], [place] [address]  . Sodium 08/22/2019 140  135 - 145 mmol/L Final  . Potassium 08/22/2019 3.8  3.5 - 5.1 mmol/L Final  . Chloride 08/22/2019 105  98 - 111 mmol/L Final  . CO2 08/22/2019 28  22 - 32 mmol/L Final  . Glucose, Bld 08/22/2019 84  70 - 99 mg/dL Final   Glucose reference range applies only to samples taken after fasting for at least 8 hours.  . BUN 08/22/2019 14  8 - 23 mg/dL Final  . Creatinine, Ser 08/22/2019 0.83  0.61 - 1.24 mg/dL Final  . Calcium 10/50/4030 9.2  8.9 - 10.3 mg/dL Final  . Total Protein 08/22/2019 7.0  6.5 - 8.1 g/dL Final  . Albumin 60/67/1519 2.9* 3.5 - 5.0 g/dL Final  . AST 51/03/3564 13* 15 - 41 U/L Final  . ALT 08/22/2019 8  0 - 44 U/L Final  . Alkaline Phosphatase 08/22/2019  124  38 - 126 U/L Final  . Total Bilirubin 08/22/2019 0.4  0.3 - 1.2 mg/dL Final  . GFR calc non Af Amer 08/22/2019 >60  >60 mL/min Final  . GFR calc Af Amer 08/22/2019 >60  >60 mL/min Final  . Eustaquio Boyden  gap 08/22/2019 7  5 - 15 Final   Performed at [facility], [address] [place] [place], [place] [address]  . Troponin I (High Sensitivity) 08/22/2019 9  <18 ng/L Final   Comment: (NOTE) Elevated high sensitivity troponin I (hsTnI) values and significant  changes across serial measurements may suggest ACS but many other  chronic and acute conditions are known to elevate hsTnI results.  Refer to the "Links" section for chest pain algorithms and additional  guidance. Performed at [facility], [address] [place] [place], [place] [address]     Assessment:  Jeremiah [name]. is a 79 y.o. male withrheumatoid arthritis and a history of pancytopenia s/p methotrexate toxicity in 03/2019.  He had not been taking folic acid.  He had folic acid deficiency and was started on oral folate. Nadir counts on 04/05/2019 revealed a hematocrit of 27.0, hemoglobin 8.9, MCV 85.2, platelets 81,000, and WBC 1000 (ANC 200).  He received GCSF (Granix) from 04/11/2019 - 04/14/2019.  Counts on 04/24/2019 revealed a hematocrit of 26.6, hemoglobin 8.6, platelets 570,000, and WBC 15,200.  Peripheral blood flow cytometry on 04/09/2020 revealed  1% myeloblasts in the setting of pancytopenia and phenotypic aberrancies of neutrophils and monocytes.  There was a CD5-, CD10- clonal B cell population, non specific phenotype, 2% of leukocytes (< 5000/uL) c/w a monoclonal B-cell lymphocytosis.  Bone marrow on 04/10/2019 revealed a hypercellular marrow with erythroid hyperplasia, rare ringed sideroblasts and dyspoiesis. There was polytic plasmacytosis (6-10%). The plasma cells coexpressed CD56 (unusual) but appeared polytypic by kappa and lambda in situ hybridization.  There were no increased blasts. Cytogenetics were  normal (46, XX). Bone marrow findings were felt likely related to methotrexate or possibly a myelodysplastic syndrome (MDS).  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  MYD88  L265P can be seen in lymphoplasmacytic lymphoma/Wldenstrom's macroglobulinemia, IgM MGUS, CLL, marginal zone lymphoma, splenic lymphoma, and some non-germinal center subtype diffuse large B celll lymphomas.  Flow cytometry on 07/09/2019 revealed a CD5, CD10, and CD103 negative monoclonal B cell population , non specific phenotype, <1% of leukocytes, < 5,000/uL.  This represent a monoclonal B-cell lymphocytosis.  This is not the typical phenotype for CLL, hairy cell leukemia, or follicular center cell lymphoma.  SPEP on 07/09/2019 revealed no monoclonal protein and normal immunoglobulins.  He last received methotrexate on 06/22/2019 and 06/29/2019.  He has a history of folic acid deficiency.   Folate was 3.4 on 04/06/2019, 11.5 on 04/22/2019, and 11.4 on 07/08/2019.  He was admitted to [facility] from 07/02/2019 to 07/10/2019 with weakness, fever of 102.1, chills and acute encephalopathy. He was diffusely painful to touch. COVID-19 testing was positive by PCR. Influenza A and B were negative. Blood cultures were negative. Urine cultures grew multiple species.  He received broad spectrum antibiotics and remdesivir.  He was treated with Solumedrol for a rheumatoid flare and discharged on a slow prednisone taper.  He received IV then oral folic acid.  Chest CT angiogram on 07/05/2019 revealed no pulmonary embolism.  There were no enlarged mediastinal, hilar or axillary adenopathy.  Bilateral lower extremity duplex on 07/05/2019 showed no evidence of DVT in either lower extremity.  Symptomatically, he feels stronger.  He continues to lose weight.  Appetite is better.  He denies any bleeding.  Exam reveals no adenopathy or hepatosplenomegaly.  Plan: 1.   Review available  labs from 08/22/2019 [facility]) and 12/10/2019 ([facility]).. 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  Hemoglobin 11.3.  MCV 77.5.  Platelets 237,000.  WBC 6400 with an [place] of 3670 and an ALC of 1610 on 12/10/2019.  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  SPEP revealed no monoclonal protein.  IgM, IgG, and IgA were normal.  Chest CT angiogram on 07/05/2019 revealed no adenopathy.  He is currently doing well.  Concern is discussed regarding weight loss. 3.   Iron deficiency   Hematocrit 36.2.  Hemoglobin 11.3.  MCV 77.5.  Patient has microcytic RBC indices.  EGD on 04/24/2019 was negative.  Patient is due for colonoscopy.  Encourage patient to schedule appointment with the [facility] clinic.  Begin ferrous sulfate 1 tablet p.o. daily with orange juice or vitamin C.  Advance dose as tolerated to 1 pill BID then 1 pill TID. 4.   Folic acid deficiency  Patient initially developed folic acid deficiency when not taking supplementation on methotrexate.  Folate was 11.4 on 07/08/2019.  Continue to monitor. 5.   Weight loss  Concern again raised regarding weight loss.  Discuss importance of caloric intake.  Consider imaging and (CT scans). 6.   Preauth Venofer. 7.   Patient to begin oral iron. 8.   Patient to make an appt with [facility]. 9.   RTC in 1 month for labs (CBC with diff, ferritin, iron studies, sed rate). 10.   RTC in 2 months for MD assessment, labs (CBC with diff, ferritin- day before) and +/- Venofer.  I discussed the assessment and treatment plan with the patient.  The patient was provided an opportunity to ask questions and all were answered.  The patient agreed with the plan and demonstrated an  understanding of the instructions.  The patient was advised to call back if the symptoms worsen or if the condition fails to improve as anticipated.  I provided 17 minutes of face-to-face time during this this encounter and > 50% was spent counseling as documented under my assessment and plan.  An additional 10+ minutes were spent reviewing his chart (Epic and Care Everywhere) including notes, labs, and imaging studies.    Letonia Stead C. Mike Gip, MD, PhD    12/24/2019, 11:55 AM  I, Mirian Mo Tufford, am acting as Education administrator for [employer]. Mike Gip, MD, PhD.  I, Lossie Kalp C. Mike Gip, MD, have reviewed the above documentation for accuracy and completeness, and I agree with the above.

## 2019-12-24 ENCOUNTER — Other Ambulatory Visit: Payer: Self-pay

## 2019-12-24 ENCOUNTER — Inpatient Hospital Stay: Payer: Medicare PPO | Attending: Hematology and Oncology | Admitting: Hematology and Oncology

## 2019-12-24 ENCOUNTER — Encounter: Payer: Self-pay | Admitting: Hematology and Oncology

## 2019-12-24 VITALS — BP 127/78 | HR 86 | Temp 97.6°F | Wt 208.3 lb

## 2019-12-24 DIAGNOSIS — M069 Rheumatoid arthritis, unspecified: Secondary | ICD-10-CM | POA: Insufficient documentation

## 2019-12-24 DIAGNOSIS — E538 Deficiency of other specified B group vitamins: Secondary | ICD-10-CM | POA: Insufficient documentation

## 2019-12-24 DIAGNOSIS — R634 Abnormal weight loss: Secondary | ICD-10-CM

## 2019-12-24 DIAGNOSIS — Z7952 Long term (current) use of systemic steroids: Secondary | ICD-10-CM | POA: Diagnosis not present

## 2019-12-24 DIAGNOSIS — D72820 Lymphocytosis (symptomatic): Secondary | ICD-10-CM | POA: Insufficient documentation

## 2019-12-24 DIAGNOSIS — Z7982 Long term (current) use of aspirin: Secondary | ICD-10-CM | POA: Insufficient documentation

## 2019-12-24 DIAGNOSIS — D509 Iron deficiency anemia, unspecified: Secondary | ICD-10-CM | POA: Diagnosis not present

## 2019-12-24 DIAGNOSIS — I10 Essential (primary) hypertension: Secondary | ICD-10-CM | POA: Insufficient documentation

## 2019-12-24 DIAGNOSIS — Z87891 Personal history of nicotine dependence: Secondary | ICD-10-CM | POA: Insufficient documentation

## 2019-12-24 DIAGNOSIS — Z79899 Other long term (current) drug therapy: Secondary | ICD-10-CM | POA: Diagnosis not present

## 2019-12-24 DIAGNOSIS — Z8249 Family history of ischemic heart disease and other diseases of the circulatory system: Secondary | ICD-10-CM | POA: Insufficient documentation

## 2019-12-24 NOTE — Progress Notes (Signed)
Patient here for oncology follow-up appointment, wife states patient has had trouble walking. No other issues.

## 2019-12-24 NOTE — Patient Instructions (Signed)
Patient to take ferrous sulfate 325 mg by mouth once a day with orange juice or vitamin C.  If tolertes well, can take ferrous sulfate 325 mg by mouth twice a day.  If you do not tolerate oral iron, can consider IV iron.  Make appointment with the [facility] (Dr Alice Reichert).    Iron Sucrose injection What is this medicine? IRON SUCROSE (AHY ern SOO krohs) is an iron complex. Iron is used to make healthy red blood cells, which carry oxygen and nutrients throughout the body. This medicine is used to treat iron deficiency anemia in people with chronic kidney disease. This medicine may be used for other purposes; ask your health care provider or pharmacist if you have questions. COMMON BRAND NAME(S): Venofer What should I tell my health care provider before I take this medicine? They need to know if you have any of these conditions:  anemia not caused by low iron levels  heart disease  high levels of iron in the blood  kidney disease  liver disease  an unusual or allergic reaction to iron, other medicines, foods, dyes, or preservatives  pregnant or trying to get pregnant  breast-feeding How should I use this medicine? This medicine is for infusion into a vein. It is given by a health care professional in a hospital or clinic setting. Talk to your pediatrician regarding the use of this medicine in children. While this drug may be prescribed for children as young as 2 years for selected conditions, precautions do apply. Overdosage: If you think you have taken too much of this medicine contact a poison control center or emergency room at once. NOTE: This medicine is only for you. Do not share this medicine with others. What if I miss a dose? It is important not to miss your dose. Call your doctor or health care professional if you are unable to keep an appointment. What may interact with this medicine? Do not take this medicine with any of the following  medications:  deferoxamine  dimercaprol  other iron products This medicine may also interact with the following medications:  chloramphenicol  deferasirox This list may not describe all possible interactions. Give your health care provider a list of all the medicines, herbs, non-prescription drugs, or dietary supplements you use. Also tell them if you smoke, drink alcohol, or use illegal drugs. Some items may interact with your medicine. What should I watch for while using this medicine? Visit your doctor or healthcare professional regularly. Tell your doctor or healthcare professional if your symptoms do not start to get better or if they get worse. You may need blood work done while you are taking this medicine. You may need to follow a special diet. Talk to your doctor. Foods that contain iron include: whole grains/cereals, dried fruits, beans, or peas, leafy green vegetables, and organ meats (liver, kidney). What side effects may I notice from receiving this medicine? Side effects that you should report to your doctor or health care professional as soon as possible:  allergic reactions like skin rash, itching or hives, swelling of the face, lips, or tongue  breathing problems  changes in blood pressure  cough  fast, irregular heartbeat  feeling faint or lightheaded, falls  fever or chills  flushing, sweating, or hot feelings  joint or muscle aches/pains  seizures  swelling of the ankles or feet  unusually weak or tired Side effects that usually do not require medical attention (report to your doctor or health care professional  if they continue or are bothersome):  diarrhea  feeling achy  headache  irritation at site where injected  nausea, vomiting  stomach upset  tiredness This list may not describe all possible side effects. Call your doctor for medical advice about side effects. You may report side effects to FDA at 1-800-FDA-1088. Where should I keep  my medicine? This drug is given in a hospital or clinic and will not be stored at home. NOTE: This sheet is a summary. It may not cover all possible information. If you have questions about this medicine, talk to your doctor, pharmacist, or health care provider.  2020 Elsevier/Gold Standard (2011-02-15 17:14:35)

## 2020-01-21 ENCOUNTER — Other Ambulatory Visit: Payer: Self-pay

## 2020-01-21 ENCOUNTER — Inpatient Hospital Stay: Payer: Medicare PPO | Attending: Hematology and Oncology

## 2020-01-21 DIAGNOSIS — E611 Iron deficiency: Secondary | ICD-10-CM | POA: Diagnosis not present

## 2020-01-21 DIAGNOSIS — E538 Deficiency of other specified B group vitamins: Secondary | ICD-10-CM | POA: Diagnosis not present

## 2020-01-21 DIAGNOSIS — D72820 Lymphocytosis (symptomatic): Secondary | ICD-10-CM | POA: Insufficient documentation

## 2020-01-21 DIAGNOSIS — R634 Abnormal weight loss: Secondary | ICD-10-CM | POA: Insufficient documentation

## 2020-01-21 DIAGNOSIS — Z79899 Other long term (current) drug therapy: Secondary | ICD-10-CM | POA: Insufficient documentation

## 2020-01-21 LAB — IRON AND TIBC
Iron: 21 ug/dL — ABNORMAL LOW (ref 45–182)
Saturation Ratios: 7 % — ABNORMAL LOW (ref 17.9–39.5)
TIBC: 291 ug/dL (ref 250–450)
UIBC: 270 ug/dL

## 2020-01-21 LAB — CBC WITH DIFFERENTIAL/PLATELET
Abs Immature Granulocytes: 0.02 10*3/uL (ref 0.00–0.07)
Basophils Absolute: 0.1 10*3/uL (ref 0.0–0.1)
Basophils Relative: 1 %
Eosinophils Absolute: 0.1 10*3/uL (ref 0.0–0.5)
Eosinophils Relative: 1 %
HCT: 41.6 % (ref 39.0–52.0)
Hemoglobin: 12.9 g/dL — ABNORMAL LOW (ref 13.0–17.0)
Immature Granulocytes: 1 %
Lymphocytes Relative: 20 %
Lymphs Abs: 0.9 10*3/uL (ref 0.7–4.0)
MCH: 24.6 pg — ABNORMAL LOW (ref 26.0–34.0)
MCHC: 31 g/dL (ref 30.0–36.0)
MCV: 79.2 fL — ABNORMAL LOW (ref 80.0–100.0)
Monocytes Absolute: 0.6 10*3/uL (ref 0.1–1.0)
Monocytes Relative: 14 %
Neutro Abs: 2.8 10*3/uL (ref 1.7–7.7)
Neutrophils Relative %: 63 %
Platelets: 201 10*3/uL (ref 150–400)
RBC: 5.25 MIL/uL (ref 4.22–5.81)
RDW: 22.8 % — ABNORMAL HIGH (ref 11.5–15.5)
WBC: 4.4 10*3/uL (ref 4.0–10.5)
nRBC: 0 % (ref 0.0–0.2)

## 2020-01-21 LAB — FERRITIN: Ferritin: 64 ng/mL (ref 24–336)

## 2020-01-21 LAB — SEDIMENTATION RATE: Sed Rate: 9 mm/hr (ref 0–20)

## 2020-02-01 ENCOUNTER — Other Ambulatory Visit: Payer: Self-pay | Admitting: Pain Medicine

## 2020-02-15 ENCOUNTER — Inpatient Hospital Stay: Payer: Medicare PPO

## 2020-02-15 ENCOUNTER — Other Ambulatory Visit: Payer: Self-pay

## 2020-02-15 DIAGNOSIS — D72820 Lymphocytosis (symptomatic): Secondary | ICD-10-CM

## 2020-02-15 DIAGNOSIS — E538 Deficiency of other specified B group vitamins: Secondary | ICD-10-CM

## 2020-02-15 LAB — CBC WITH DIFFERENTIAL/PLATELET
Abs Immature Granulocytes: 0.02 10*3/uL (ref 0.00–0.07)
Basophils Absolute: 0.1 10*3/uL (ref 0.0–0.1)
Basophils Relative: 1 %
Eosinophils Absolute: 0.1 10*3/uL (ref 0.0–0.5)
Eosinophils Relative: 2 %
HCT: 41.6 % (ref 39.0–52.0)
Hemoglobin: 12.8 g/dL — ABNORMAL LOW (ref 13.0–17.0)
Immature Granulocytes: 0 %
Lymphocytes Relative: 24 %
Lymphs Abs: 1.3 10*3/uL (ref 0.7–4.0)
MCH: 25 pg — ABNORMAL LOW (ref 26.0–34.0)
MCHC: 30.8 g/dL (ref 30.0–36.0)
MCV: 81.1 fL (ref 80.0–100.0)
Monocytes Absolute: 0.7 10*3/uL (ref 0.1–1.0)
Monocytes Relative: 13 %
Neutro Abs: 3.2 10*3/uL (ref 1.7–7.7)
Neutrophils Relative %: 60 %
Platelets: 182 10*3/uL (ref 150–400)
RBC: 5.13 MIL/uL (ref 4.22–5.81)
RDW: 22.2 % — ABNORMAL HIGH (ref 11.5–15.5)
WBC: 5.4 10*3/uL (ref 4.0–10.5)
nRBC: 0 % (ref 0.0–0.2)

## 2020-02-15 LAB — FERRITIN: Ferritin: 52 ng/mL (ref 24–336)

## 2020-02-16 ENCOUNTER — Inpatient Hospital Stay: Payer: Medicare PPO | Admitting: Hematology and Oncology

## 2020-02-16 ENCOUNTER — Inpatient Hospital Stay: Payer: Medicare PPO

## 2020-02-23 NOTE — Progress Notes (Signed)
[facility]  [address] [place], [place] [address] Phone: [phone]  Fax: [phone]   Clinic Day:  02/24/2020  Referring physician: Derinda Late, MD  Chief Complaint: Jeremiah Moravek. is a 79 y.o. male with rheumatoid arthritis previously on methotrexate and a history of pancytopenia who is seen for 2 month assessment.  HPI: The patient was last seen in the hematology clinic on 12/24/2019. At that time, he felt stronger.  He continued to lose weight.  Appetite was better.  He denied any bleeding.  Exam revealed no adenopathy or hepatosplenomegaly. He was to begin oral iron and make an appointment with GI.  Labs followed:  01/21/2020: Hematocrit 41.6, hemoglobin 12.9, platelets 201,000, WBC 4,400. Ferritin 64. Iron saturation 7%. TIBC 291. Sed rate 9. 02/15/2020: Hematocrit 41.6, hemoglobin 12.8, platelets 182,000, WBC 5,400. Ferritin 52.  He was seen by Dr Ephriam Jenkins in the rheumatology clinic at [facility] on 02/17/2020.  He was taking prednisone, Plaquenil, and leflunomide.  He was able to form a fist.  He denied joint swelling.  CRP was 7.  Follow-up is scheduled in 3 months.  During the interim, he has been "good". He feels stronger. His goal weight is 215 lbs. He gets around at home in a wheelchair. When his physical therapist comes, he uses a rolling walker. His arthritis has improved after he switched his medications. He takes oral iron once daily.  He has an appointment with GI at the beginning of 03/2020.   Past Medical History:  Diagnosis Date  . Allergy   . Arthritis   . Collagen vascular disease ([place])   . Hypertension   . Rheumatoid arthritis ([place])   . Seizures ([place])   . Sepsis ([place]) 05/23/2015    Past Surgical History:  Procedure Laterality Date  . BACK SURGERY    . back surgery five times  04/2016  . CARPAL TUNNEL RELEASE    . HERNIA REPAIR    . JOINT REPLACEMENT    . knee replacement right    . PEG PLACEMENT N/A  04/24/2019   Procedure: PERCUTANEOUS ENDOSCOPIC GASTROSTOMY (PEG) PLACEMENT;  Surgeon: Toledo, Benay Pike, MD;  Location: [facility] ENDOSCOPY;  Service: Gastroenterology;  Laterality: N/A;  . right hip replacement    . rotator cuff surgery    . SPINE SURGERY      Family History  Problem Relation Age of Onset  . Hypertension Other   . Hypertension Mother   . Dementia Mother   . Dementia Father   . Prostate cancer Neg Hx   . Bladder Cancer Neg Hx   . Kidney cancer Neg Hx     Social History:  reports that he has quit smoking. His smoking use included cigarettes. He has a 20.00 pack-year smoking history. He has never used smokeless tobacco. He reports that he does not drink alcohol and does not use drugs.  He denies any exposure to radiation or toxins.  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  He lives in [place].  His wife's name is Jeremiah [name]. The patient is accompanied by his wife, Jeremiah [name], today.   Allergies:  Allergies  Allergen Reactions  . Cefoperazone Anaphylaxis  . Penicillins Anaphylaxis and Other (See Comments)    Has patient had a PCN reaction causing immediate rash, facial/tongue/throat swelling, SOB or lightheadedness with hypotension: Yes Has patient had a PCN reaction causing  severe rash involving mucus membranes or skin necrosis: No Has patient had a PCN reaction that required hospitalization No Has patient had a PCN reaction occurring within the last 10 years: No If all of the above answers are "NO", then may proceed with Cephalosporin use.  . Latex Rash    Current Medications: Current Outpatient Medications  Medication Sig Dispense Refill  . acetaminophen (TYLENOL) 325 MG tablet Take 2 tablets (650 mg total) by mouth every 6 (six) hours as needed for mild pain (or Fever >/= 101).    Marland Kitchen aspirin 81 MG chewable tablet Chew 81 mg by mouth daily.    . cholecalciferol (VITAMIN D) 25 MCG tablet  Take 1 tablet (1,000 Units total) by mouth daily. 30 tablet 0  . Etanercept (ENBREL [place]) Inject into the skin.    . folic acid (FOLVITE) 1 MG tablet Take 1 mg by mouth daily.    . hydroxychloroquine (PLAQUENIL) 200 MG tablet Take 200 mg by mouth daily.    Marland Kitchen lamoTRIgine (LAMICTAL) 200 MG tablet Take 200 mg by mouth 2 (two) times daily.    Marland Kitchen lamoTRIgine (LAMICTAL) 25 MG tablet Take 25 mg by mouth 2 (two) times daily.    Marland Kitchen leflunomide (ARAVA) 10 MG tablet     . magnesium oxide (MAG-OX) 400 MG tablet Take 400 mg by mouth daily.    Marland Kitchen omeprazole (PRILOSEC) 40 MG capsule Take 40 mg by mouth daily as needed.     . predniSONE (DELTASONE) 5 MG tablet 4 tabs po day1, 3 tabs po day2; 2 tabs po day3,4; 1 tab po day5,6; 1/2 tab po day7,8,9,10 15 tablet 0  . traZODone (DESYREL) 50 MG tablet Take 50 mg by mouth at bedtime.    Marland Kitchen ascorbic acid (VITAMIN C) 500 MG tablet Take 1 tablet (500 mg total) by mouth daily. (Patient not taking: Reported on 12/24/2019) 30 tablet 0  . feeding supplement, ENSURE ENLIVE, (ENSURE ENLIVE) LIQD Take 237 mLs by mouth 3 (three) times daily between meals. (Patient not taking: Reported on 08/06/2019) 21330 mL 0  . furosemide (LASIX) 40 MG tablet Take 40 mg by mouth daily. (Patient not taking: Reported on 02/24/2020)    . gabapentin (NEURONTIN) 300 MG capsule TAKE 1 CAPSULE (300 MG TOTAL) BY MOUTH 4 (FOUR) TIMES DAILY. QHS (Patient not taking: Reported on 02/24/2020)    . metoprolol tartrate (LOPRESSOR) 25 MG tablet Take 1 tablet (25 mg total) by mouth 2 (two) times daily. (Patient not taking: Reported on 12/24/2019) 60 tablet 11  . nystatin (MYCOSTATIN) 100000 UNIT/ML suspension Take 5 mLs (500,000 Units total) by mouth 4 (four) times daily. (Patient not taking: Reported on 08/06/2019) 60 mL 0  . polyethylene glycol (MIRALAX / GLYCOLAX) 17 g packet Take 17 g by mouth daily as needed. (Patient not taking: Reported on 12/24/2019) 14 each 0  . thiamine 100 MG tablet Take 1 tablet (100 mg total) by  mouth daily. (Patient not taking: Reported on 02/24/2020) 30 tablet 0  . zinc sulfate 220 (50 Zn) MG capsule Take 1 capsule (220 mg total) by mouth daily. (Patient not taking: Reported on 12/24/2019) 30 capsule 0   No current facility-administered medications for this visit.    Review of Systems  Constitutional: Negative for chills, diaphoresis, fever, malaise/fatigue and weight loss (no new weight).       Feels "good."  HENT: Negative for congestion, ear discharge, ear pain, hearing loss, nosebleeds, sinus pain, sore throat and tinnitus.   Eyes: Negative.  Negative for  blurred vision.  Respiratory: Negative.  Negative for cough, hemoptysis, sputum production and shortness of breath.   Cardiovascular: Negative.  Negative for chest pain, palpitations and leg swelling.  Gastrointestinal: Negative for abdominal pain, blood in stool, constipation, diarrhea, heartburn, melena, nausea and vomiting.  Genitourinary: Negative.  Negative for dysuria, frequency, hematuria and urgency.  Musculoskeletal: Positive for joint pain (rheumatoid and osteoarthritis). Negative for back pain, falls, myalgias and neck pain.  Skin: Negative.  Negative for itching and rash.  Neurological: Negative.  Negative for dizziness, tingling, sensory change, weakness (strength improving) and headaches.  Endo/Heme/Allergies: Negative.  Does not bruise/bleed easily.  Psychiatric/Behavioral: Negative.  Negative for depression and memory loss. The patient is not nervous/anxious and does not have insomnia.   All other systems reviewed and are negative.  Performance status (ECOG):  2  Vitals Blood pressure 130/79, pulse 100, temperature (!) 97.1 F (36.2 C), temperature source Tympanic, resp. rate 18, SpO2 98 %.   Physical Exam Vitals and nursing note reviewed.  Constitutional:      General: He is not in acute distress.    Appearance: He is well-developed.     Comments: Patient was examined in a wheelchair.  HENT:     Head:  Normocephalic and atraumatic.     Comments: Short gray hair.  Male pattern baldness.    Mouth/Throat:     Mouth: Mucous membranes are moist.     Pharynx: Oropharynx is clear.  Eyes:     General: No scleral icterus.    Extraocular Movements: Extraocular movements intact.     Conjunctiva/sclera: Conjunctivae normal.     Pupils: Pupils are equal, round, and reactive to light.     Comments: Glasses.  Blue eyes.  Cardiovascular:     Rate and Rhythm: Normal rate and regular rhythm.     Heart sounds: Normal heart sounds. No murmur heard.   Pulmonary:     Effort: Pulmonary effort is normal. No respiratory distress.     Breath sounds: Normal breath sounds. No wheezing or rales.  Chest:     Chest wall: No tenderness.  Abdominal:     General: Bowel sounds are normal. There is no distension.     Palpations: Abdomen is soft. There is no hepatomegaly, splenomegaly or mass.     Tenderness: There is no abdominal tenderness. There is no guarding or rebound.  Musculoskeletal:        General: No tenderness. Normal range of motion.     Cervical back: Normal range of motion and neck supple.     Right lower leg: Edema (2-3+, ankle) present.     Left lower leg: Edema (2-3+, ankle) present.     Comments: Able to form a fist.  Lymphadenopathy:     Head:     Right side of head: No preauricular, posterior auricular or occipital adenopathy.     Left side of head: No preauricular, posterior auricular or occipital adenopathy.     Cervical: No cervical adenopathy.     Upper Body:     Right upper body: No supraclavicular or axillary adenopathy.     Left upper body: No supraclavicular or axillary adenopathy.     Lower Body: No right inguinal adenopathy. No left inguinal adenopathy.  Skin:    General: Skin is warm and dry.  Neurological:     Mental Status: He is alert and oriented to person, place, and time.  Psychiatric:        Behavior: Behavior normal.  Thought Content: Thought content normal.         Judgment: Judgment normal.    No visits with results within 3 Day(s) from this visit.  Latest known visit with results is:  Appointment on 02/15/2020  Component Date Value Ref Range Status  . Ferritin 02/15/2020 52  24 - 336 ng/mL Final   Performed at [facility], [address] [place], [place] [address]  . WBC 02/15/2020 5.4  4.0 - 10.5 K/uL Final  . RBC 02/15/2020 5.13  4.22 - 5.81 MIL/uL Final  . Hemoglobin 02/15/2020 12.8* 13.0 - 17.0 g/dL Final  . HCT 02/15/2020 41.6  39 - 52 % Final  . MCV 02/15/2020 81.1  80.0 - 100.0 fL Final  . MCH 02/15/2020 25.0* 26.0 - 34.0 pg Final  . MCHC 02/15/2020 30.8  30.0 - 36.0 g/dL Final  . RDW 02/15/2020 22.2* 11.5 - 15.5 % Final  . Platelets 02/15/2020 182  150 - 400 K/uL Final  . nRBC 02/15/2020 0.0  0.0 - 0.2 % Final  . Neutrophils Relative % 02/15/2020 60  % Final  . Neutro Abs 02/15/2020 3.2  1.7 - 7.7 K/uL Final  . Lymphocytes Relative 02/15/2020 24  % Final  . Lymphs Abs 02/15/2020 1.3  0.7 - 4.0 K/uL Final  . Monocytes Relative 02/15/2020 13  % Final  . Monocytes Absolute 02/15/2020 0.7  0 - 1 K/uL Final  . Eosinophils Relative 02/15/2020 2  % Final  . Eosinophils Absolute 02/15/2020 0.1  0 - 0 K/uL Final  . Basophils Relative 02/15/2020 1  % Final  . Basophils Absolute 02/15/2020 0.1  0 - 0 K/uL Final  . Immature Granulocytes 02/15/2020 0  % Final  . Abs Immature Granulocytes 02/15/2020 0.02  0.00 - 0.07 K/uL Final   Performed at [facility], [address] [place], [place] [address]    Assessment:  Jeremiah Stead. is a 79 y.o. male withrheumatoid arthritis and a history of pancytopenia s/p methotrexate toxicity in 03/2019.  He had not been taking folic acid.  He had folic acid deficiency and was started on oral folate. Nadir counts on 04/05/2019 revealed a hematocrit of 27.0, hemoglobin 8.9, MCV 85.2, platelets 81,000, and WBC 1000 (ANC 200).  He received GCSF (Granix) from 04/11/2019 - 04/14/2019.   Counts on 04/24/2019 revealed a hematocrit of 26.6, hemoglobin 8.6, platelets 570,000, and WBC 15,200.  Peripheral blood flow cytometry on 04/09/2020 revealed  1% myeloblasts in the setting of pancytopenia and phenotypic aberrancies of neutrophils and monocytes.  There was a CD5-, CD10- clonal B cell population, non specific phenotype, 2% of leukocytes (< 5000/uL) c/w a monoclonal B-cell lymphocytosis.  Bone marrow on 04/10/2019 revealed a hypercellular marrow with erythroid hyperplasia, rare ringed sideroblasts and dyspoiesis. There was polytic plasmacytosis (6-10%). The plasma cells coexpressed CD56 (unusual) but appeared polytypic by kappa and lambda in situ hybridization.  There were no increased blasts. Cytogenetics were normal (33, XX). Bone marrow findings were felt likely related to methotrexate or possibly a myelodysplastic syndrome (MDS).  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  MYD88  L265P can be seen in lymphoplasmacytic lymphoma/Wldenstrom's macroglobulinemia, IgM MGUS, CLL, marginal zone lymphoma, splenic lymphoma, and some non-germinal center subtype diffuse large B celll lymphomas.  Flow cytometry on 07/09/2019 revealed a CD5, CD10, and CD103 negative monoclonal B cell population , non specific phenotype, <1% of leukocytes, < 5,000/uL.  This represents a monoclonal  B-cell lymphocytosis.  This is not the typical phenotype for CLL, hairy cell leukemia, or follicular center cell lymphoma.  SPEP on 07/09/2019 revealed no monoclonal protein and normal immunoglobulins.  He last received methotrexate on 06/22/2019 and 06/29/2019.  He has a history of folic acid deficiency.   Folate was 3.4 on 04/06/2019, 11.5 on 04/22/2019, and 11.4 on 07/08/2019.  He was admitted to [facility] from 07/02/2019 to 07/10/2019 with weakness, fever of 102.1, chills and acute encephalopathy. He was diffusely painful to touch. COVID-19 testing  was positive by PCR. Influenza A and B were negative. Blood cultures were negative. Urine cultures grew multiple species.  He received broad spectrum antibiotics and remdesivir.  He was treated with Solumedrol for a rheumatoid flare and discharged on a slow prednisone taper.  He received IV then oral folic acid.  Chest CT angiogram on 07/05/2019 revealed no pulmonary embolism.  There were no enlarged mediastinal, hilar or axillary adenopathy.  Bilateral lower extremity duplex on 07/05/2019 showed no evidence of DVT in either lower extremity.  Symptomatically, he feels good.  He denies any bleeding.  Exam is stable.  Plan: 1.   Review labs from 02/15/2020. 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  Hemoglobin 12.8.  MCV 81.1.  Platelets 182,000.  WBC 5400 with an ANC of 3200 and an ALC of 1300.  Flow cytometry on 07/09/2019 revealed monoclonal B-cell population likely representing a monoclonal B-cell lymphocytosis.  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  SPEP revealed no monoclonal protein.  IgM, IgG, and IgA were normal.  Chest CT angiogram on 07/05/2019 revealed no adenopathy.  Symptomatically, he is doing well.  Exam reveals no adenopathy or hepatyosplenomegaly. 3.   Iron deficiency   Hematocrit 41.6.  Hemoglobin 12.8.  MCV 81.1.   Ferritin is 52.  Microcytic RBC indices have resolved.  EGD on 04/24/2019 was negative.  Patient is scheduled with an appointment with GI on 03/24/2020.  Continue ferrous sulfate 1 tablet p.o. daily. 4.   Folic acid deficiency  Patient initially developed folic acid deficiency when not taking supplementation on methotrexate.  Folate was 11.4 on 07/08/2019.  Patient off MTX and on Enbrel.  Monitor periodically. 5.   Weight loss  No new weight. 6.   RTC in 3 months for MD assessment and labs (CBC with diff,  ferritin, SPEP, folate).  I discussed the assessment and treatment plan with the patient.  The patient was provided an opportunity to ask questions and all were answered.  The patient agreed with the plan and demonstrated an understanding of the instructions.  The patient was advised to call back if the symptoms worsen or if the condition fails to improve as anticipated.   Jeremiah Martha C. Mike Gip, MD, PhD    02/24/2020, 10:30 AM  I, Jeremiah [name], am acting as Education administrator for [employer]. Mike Gip, MD, PhD.  I, Jeremiah Makin C. Mike Gip, MD, have reviewed the above documentation for accuracy and completeness, and I agree with the above.

## 2020-02-24 ENCOUNTER — Inpatient Hospital Stay: Payer: Medicare PPO

## 2020-02-24 ENCOUNTER — Inpatient Hospital Stay: Payer: Medicare PPO | Attending: Hematology and Oncology | Admitting: Hematology and Oncology

## 2020-02-24 ENCOUNTER — Other Ambulatory Visit: Payer: Self-pay

## 2020-02-24 VITALS — BP 130/79 | HR 100 | Temp 97.1°F | Resp 18

## 2020-02-24 DIAGNOSIS — Z87891 Personal history of nicotine dependence: Secondary | ICD-10-CM | POA: Diagnosis not present

## 2020-02-24 DIAGNOSIS — D61818 Other pancytopenia: Secondary | ICD-10-CM | POA: Diagnosis not present

## 2020-02-24 DIAGNOSIS — E538 Deficiency of other specified B group vitamins: Secondary | ICD-10-CM

## 2020-02-24 DIAGNOSIS — M069 Rheumatoid arthritis, unspecified: Secondary | ICD-10-CM | POA: Insufficient documentation

## 2020-02-24 DIAGNOSIS — I10 Essential (primary) hypertension: Secondary | ICD-10-CM | POA: Insufficient documentation

## 2020-02-24 DIAGNOSIS — Z79899 Other long term (current) drug therapy: Secondary | ICD-10-CM | POA: Insufficient documentation

## 2020-02-24 DIAGNOSIS — R634 Abnormal weight loss: Secondary | ICD-10-CM | POA: Diagnosis not present

## 2020-02-24 DIAGNOSIS — D509 Iron deficiency anemia, unspecified: Secondary | ICD-10-CM | POA: Diagnosis not present

## 2020-02-24 DIAGNOSIS — Z7982 Long term (current) use of aspirin: Secondary | ICD-10-CM | POA: Insufficient documentation

## 2020-02-24 DIAGNOSIS — Z7952 Long term (current) use of systemic steroids: Secondary | ICD-10-CM | POA: Diagnosis not present

## 2020-02-24 DIAGNOSIS — D72820 Lymphocytosis (symptomatic): Secondary | ICD-10-CM

## 2020-02-26 ENCOUNTER — Other Ambulatory Visit: Payer: Self-pay | Admitting: *Deleted

## 2020-02-27 MED ORDER — FOLIC ACID 1 MG PO TABS: 1.0000 mg | ORAL_TABLET | Freq: Every day | ORAL | 1 refills | Status: AC

## 2020-04-25 ENCOUNTER — Other Ambulatory Visit
Admission: RE | Admit: 2020-04-25 | Discharge: 2020-04-25 | Disposition: A | Discharge status: Home / Self Care | Payer: Medicare PPO | Source: Ambulatory Visit | Attending: Internal Medicine | Admitting: Internal Medicine

## 2020-04-25 ENCOUNTER — Other Ambulatory Visit: Payer: Self-pay

## 2020-04-25 DIAGNOSIS — Z20822 Contact with and (suspected) exposure to covid-19: Secondary | ICD-10-CM | POA: Insufficient documentation

## 2020-04-25 DIAGNOSIS — Z01812 Encounter for preprocedural laboratory examination: Secondary | ICD-10-CM | POA: Diagnosis present

## 2020-04-26 ENCOUNTER — Encounter: Payer: Self-pay | Admitting: Internal Medicine

## 2020-04-26 LAB — SARS CORONAVIRUS 2 (TAT 6-24 HRS): SARS Coronavirus 2: NEGATIVE

## 2020-04-27 ENCOUNTER — Ambulatory Visit: Payer: Medicare PPO | Admitting: Certified Registered Nurse Anesthetist

## 2020-04-27 ENCOUNTER — Encounter
Admission: RE | Disposition: A | Discharge status: Home / Self Care | Payer: Self-pay | Source: Home / Self Care | Attending: Internal Medicine

## 2020-04-27 ENCOUNTER — Encounter: Payer: Self-pay | Admitting: Internal Medicine

## 2020-04-27 ENCOUNTER — Ambulatory Visit
Admission: RE | Admit: 2020-04-27 | Discharge: 2020-04-27 | Disposition: A | Discharge status: Home / Self Care | Payer: Medicare PPO | Attending: Internal Medicine | Admitting: Internal Medicine

## 2020-04-27 ENCOUNTER — Other Ambulatory Visit: Payer: Self-pay

## 2020-04-27 DIAGNOSIS — Z7982 Long term (current) use of aspirin: Secondary | ICD-10-CM | POA: Insufficient documentation

## 2020-04-27 DIAGNOSIS — K921 Melena: Secondary | ICD-10-CM | POA: Diagnosis not present

## 2020-04-27 DIAGNOSIS — D123 Benign neoplasm of transverse colon: Secondary | ICD-10-CM | POA: Diagnosis not present

## 2020-04-27 DIAGNOSIS — R634 Abnormal weight loss: Secondary | ICD-10-CM | POA: Insufficient documentation

## 2020-04-27 DIAGNOSIS — D500 Iron deficiency anemia secondary to blood loss (chronic): Secondary | ICD-10-CM | POA: Diagnosis not present

## 2020-04-27 DIAGNOSIS — Z91040 Latex allergy status: Secondary | ICD-10-CM | POA: Insufficient documentation

## 2020-04-27 DIAGNOSIS — Z880 Allergy status to penicillin: Secondary | ICD-10-CM | POA: Diagnosis not present

## 2020-04-27 DIAGNOSIS — K640 First degree hemorrhoids: Secondary | ICD-10-CM | POA: Diagnosis not present

## 2020-04-27 DIAGNOSIS — Z79899 Other long term (current) drug therapy: Secondary | ICD-10-CM | POA: Insufficient documentation

## 2020-04-27 HISTORY — DX: Type 2 diabetes mellitus without complications: E11.9

## 2020-04-27 HISTORY — DX: Anxiety disorder, unspecified: F41.9

## 2020-04-27 HISTORY — DX: Gastro-esophageal reflux disease without esophagitis: K21.9

## 2020-04-27 HISTORY — PX: COLONOSCOPY WITH PROPOFOL: SHX5780

## 2020-04-27 HISTORY — DX: Heart failure, unspecified: I50.9

## 2020-04-27 HISTORY — DX: Anemia, unspecified: D64.9

## 2020-04-27 HISTORY — DX: Depression, unspecified: F32.A

## 2020-04-27 HISTORY — PX: ESOPHAGOGASTRODUODENOSCOPY (EGD) WITH PROPOFOL: SHX5813

## 2020-04-27 SURGERY — COLONOSCOPY WITH PROPOFOL
Anesthesia: General

## 2020-04-27 MED ORDER — PROPOFOL 10 MG/ML IV BOLUS
INTRAVENOUS | Status: DC | PRN
  Administered 2020-04-27: 80 mg via INTRAVENOUS
  Administered 2020-04-27: 27 mg via INTRAVENOUS

## 2020-04-27 MED ORDER — LIDOCAINE HCL (CARDIAC) PF 100 MG/5ML IV SOSY
PREFILLED_SYRINGE | INTRAVENOUS | Status: DC | PRN
  Administered 2020-04-27: 50 mg via INTRAVENOUS

## 2020-04-27 MED ORDER — PROPOFOL 500 MG/50ML IV EMUL
INTRAVENOUS | Status: DC | PRN
  Administered 2020-04-27: 130 ug/kg/min via INTRAVENOUS

## 2020-04-27 MED ORDER — PHENYLEPHRINE HCL (PRESSORS) 10 MG/ML IV SOLN
INTRAVENOUS | Status: DC | PRN
  Administered 2020-04-27 (×2): 100 ug via INTRAVENOUS

## 2020-04-27 MED ORDER — PROPOFOL 500 MG/50ML IV EMUL
INTRAVENOUS | Status: AC
  Filled 2020-04-27: qty 50

## 2020-04-27 MED ORDER — SODIUM CHLORIDE 0.9 % IV SOLN
INTRAVENOUS | Status: DC
  Administered 2020-04-27: 20 mL/h via INTRAVENOUS

## 2020-04-27 NOTE — H&P (Signed)
Outpatient short stay form Pre-procedure 04/27/2020 11:20 AM [place] K. Alice Reichert, M.D.  Primary Physician: Derinda Late, M.D.  Reason for visit: Iron deficiency anemia, GERD, Rectal bleeding, unintentional weight loss  History of present illness:  Patient presents for diagnosis of progressive anemia and found to have iron deficiency. Has no complaints of upper symptoms such as anorexia, abdominal pain,dysphagia, hemetemesis, melena, nausea or vomiting. Has chronic GERD, controlled. Patient denies change in bowel habits. Patient does, however, complain of intermittent rectal bleeding and weight loss.    Current Facility-Administered Medications:  .  0.9 %  sodium chloride infusion, , Intravenous, Continuous, Tkeya Stencil, Benay Pike, MD, Last Rate: 20 mL/hr at 04/27/20 1054, Continued from Pre-op at 04/27/20 1054  Medications Prior to Admission  Medication Sig Dispense Refill Last Dose  . acetaminophen (TYLENOL) 325 MG tablet Take 2 tablets (650 mg total) by mouth every 6 (six) hours as needed for mild pain (or Fever >/= 101).   Past Week at Unknown time  . ascorbic acid (VITAMIN C) 500 MG tablet Take 1 tablet (500 mg total) by mouth daily. 30 tablet 0 Past Week at Unknown time  . aspirin 81 MG chewable tablet Chew 81 mg by mouth daily.   Past Week at Unknown time  . cholecalciferol (VITAMIN D) 25 MCG tablet Take 1 tablet (1,000 Units total) by mouth daily. 30 tablet 0 Past Week at Unknown time  . Etanercept (ENBREL [place]) Inject into the skin.   Past Week at Unknown time  . feeding supplement, ENSURE ENLIVE, (ENSURE ENLIVE) LIQD Take 237 mLs by mouth 3 (three) times daily between meals. 21330 mL 0 Past Week at Unknown time  . folic acid (FOLVITE) 1 MG tablet Take 1 tablet (1 mg total) by mouth daily. 30 tablet 1 Past Week at Unknown time  . furosemide (LASIX) 40 MG tablet Take 40 mg by mouth daily.    04/27/2020 at 0600  . gabapentin (NEURONTIN) 300 MG capsule TAKE 1 CAPSULE (300 MG TOTAL) BY MOUTH 4  (FOUR) TIMES DAILY. QHS   04/27/2020 at 0600  . hydroxychloroquine (PLAQUENIL) 200 MG tablet Take 200 mg by mouth daily.   04/27/2020 at 0600  . lamoTRIgine (LAMICTAL) 200 MG tablet Take 200 mg by mouth 2 (two) times daily.   04/27/2020 at 0600  . lamoTRIgine (LAMICTAL) 25 MG tablet Take 25 mg by mouth 2 (two) times daily.   04/27/2020 at 0600  . leflunomide (ARAVA) 10 MG tablet    04/27/2020 at 0600  . magnesium oxide (MAG-OX) 400 MG tablet Take 400 mg by mouth daily.   Past Week at Unknown time  . metoprolol tartrate (LOPRESSOR) 25 MG tablet Take 1 tablet (25 mg total) by mouth 2 (two) times daily. 60 tablet 11 04/27/2020 at 0600  . nystatin (MYCOSTATIN) 100000 UNIT/ML suspension Take 5 mLs (500,000 Units total) by mouth 4 (four) times daily. 60 mL 0 Past Week at Unknown time  . omeprazole (PRILOSEC) 40 MG capsule Take 40 mg by mouth daily as needed.    04/27/2020 at 0600  . polyethylene glycol (MIRALAX / GLYCOLAX) 17 g packet Take 17 g by mouth daily as needed. 14 each 0 04/26/2020 at Unknown time  . predniSONE (DELTASONE) 5 MG tablet 4 tabs po day1, 3 tabs po day2; 2 tabs po day3,4; 1 tab po day5,6; 1/2 tab po day7,8,9,10 15 tablet 0 Past Week at Unknown time  . thiamine 100 MG tablet Take 1 tablet (100 mg total) by mouth daily. 30 tablet 0  Past Week at Unknown time  . traZODone (DESYREL) 50 MG tablet Take 50 mg by mouth at bedtime.   Past Week at Unknown time  . zinc sulfate 220 (50 Zn) MG capsule Take 1 capsule (220 mg total) by mouth daily. 30 capsule 0 Past Week at Unknown time     Allergies  Allergen Reactions  . Cefoperazone Anaphylaxis  . Penicillins Anaphylaxis and Other (See Comments)    Has patient had a PCN reaction causing immediate rash, facial/tongue/throat swelling, SOB or lightheadedness with hypotension: Yes Has patient had a PCN reaction causing severe rash involving mucus membranes or skin necrosis: No Has patient had a PCN reaction that required hospitalization No Has patient  had a PCN reaction occurring within the last 10 years: No If all of the above answers are "NO", then may proceed with Cephalosporin use.  . Latex Rash     Past Medical History:  Diagnosis Date  . Allergy   . Anemia   . Anxiety   . Arthritis   . CHF (congestive heart failure) ([place])   . Collagen vascular disease ([place])   . Depression   . Diabetes mellitus without complication ([place])   . GERD (gastroesophageal reflux disease)   . Hypertension   . Rheumatoid arthritis ([place])   . Seizures ([place])   . Sepsis ([place]) 05/23/2015    Review of systems:  Otherwise negative.    Physical Exam  Gen: Alert, oriented. Appears stated age.  HEENT: [place]/AT. PERRLA. Lungs: CTA, no wheezes. CV: RR nl S1, S2. Abd: soft, benign, no masses. BS+ Ext: No edema. Pulses 2+    Planned procedures: Proceed with EGD and  colonoscopy. The patient understands the nature of the planned procedure, indications, risks, alternatives and potential complications including but not limited to bleeding, infection, perforation, damage to internal organs and possible oversedation/side effects from anesthesia. The patient agrees and gives consent to proceed.  Please refer to procedure notes for findings, recommendations and patient disposition/instructions.     Mitsue Peery K. Alice Reichert, M.D. Gastroenterology 04/27/2020  11:20 AM

## 2020-04-27 NOTE — Op Note (Signed)
[facility] Gastroenterology Patient Name: Jeremiah [name] Procedure Date: 04/27/2020 11:46 AM MRN: [id number] Account #: [id number] Date of Birth: May 25, 1940 Admit Type: Outpatient Age: 79 Room: [facility] ENDO ROOM 2 Gender: Male Note Status: Finalized Procedure:             Upper GI endoscopy Indications:           Iron deficiency anemia secondary to chronic blood                         loss, Hematochezia, Weight loss Providers:             Benay Pike. Maria Coin MD, MD Medicines:             Propofol per Anesthesia Complications:         No immediate complications. Procedure:             Pre-Anesthesia Assessment:                        - The risks and benefits of the procedure and the                         sedation options and risks were discussed with the                         patient. All questions were answered and informed                         consent was obtained.                        - Patient identification and proposed procedure were                         verified prior to the procedure by the nurse. The                         procedure was verified in the procedure room.                        - ASA Grade Assessment: III - A patient with severe                         systemic disease.                        - After reviewing the risks and benefits, the patient                         was deemed in satisfactory condition to undergo the                         procedure.                        After obtaining informed consent, the endoscope was                         passed under direct vision. Throughout the procedure,  the patient's blood pressure, pulse, and oxygen                         saturations were monitored continuously. The Endoscope                         was introduced through the mouth, and advanced to the                         third part of duodenum. The upper GI endoscopy was                          accomplished without difficulty. The patient tolerated                         the procedure well. Findings:      The esophagus was normal.      The stomach was normal.      The examined duodenum was normal. Impression:            - Normal esophagus.                        - Normal stomach.                        - Normal examined duodenum.                        - No specimens collected. Recommendation:        - Proceed with colonoscopy Procedure Code(s):     --- Professional ---                        [phone], Esophagogastroduodenoscopy, flexible,                         transoral; diagnostic, including collection of                         specimen(s) by brushing or washing, when performed                         (separate procedure) Diagnosis Code(s):     --- Professional ---                        R63.4, Abnormal weight loss                        K92.1, Melena (includes Hematochezia)                        D50.0, Iron deficiency anemia secondary to blood loss                         (chronic) CPT copyright 2019 American Medical Association. All rights reserved. The codes documented in this report are preliminary and upon coder review may  be revised to meet current compliance requirements. Efrain Sella MD, MD 04/27/2020 12:08:14 PM This report has been signed electronically. Number of Addenda: 0 Note Initiated On: 04/27/2020 11:46 AM Estimated Blood Loss:  Estimated blood loss: none.  [facility]

## 2020-04-27 NOTE — Interval H&P Note (Signed)
History and Physical Interval Note:  04/27/2020 11:22 AM  Jeremiah [name].  has presented today for surgery, with the diagnosis of IDA RECTAL BLEEDING WEIGHT LOSS.  The various methods of treatment have been discussed with the patient and family. After consideration of risks, benefits and other options for treatment, the patient has consented to  Procedure(s): COLONOSCOPY WITH PROPOFOL (N/A) ESOPHAGOGASTRODUODENOSCOPY (EGD) WITH PROPOFOL (N/A) as a surgical intervention.  The patient's history has been reviewed, patient examined, no change in status, stable for surgery.  I have reviewed the patient's chart and labs.  Questions were answered to the patient's satisfaction.     [place], [place]

## 2020-04-27 NOTE — Op Note (Signed)
[name] Procedure Date: 04/27/2020 11:46 AM MRN: [id number] Account #: [id number] Date of Birth: June 21, 1940 Admit Type: Outpatient Age: 79 Room: [facility] ENDO ROOM 2 Gender: Male Note Status: Finalized Procedure:             Colonoscopy Indications:           Iron deficiency anemia secondary to chronic blood loss Providers:             Benay Pike. Hersey Maclellan MD, MD Medicines:             Propofol per Anesthesia Procedure:             Pre-Anesthesia Assessment:                        - The risks and benefits of the procedure and the                         sedation options and risks were discussed with the                         patient. All questions were answered and informed                         consent was obtained.                        - Patient identification and proposed procedure were                         verified prior to the procedure by the nurse. The                         procedure was verified in the procedure room.                        - ASA Grade Assessment: III - A patient with severe                         systemic disease.                        - After reviewing the risks and benefits, the patient                         was deemed in satisfactory condition to undergo the                         procedure.                        After obtaining informed consent, the colonoscope was                         passed under direct vision. Throughout the procedure,                         the patient's blood pressure, pulse, and oxygen  saturations were monitored continuously. The                         Colonoscope was introduced through the anus and                         advanced to the the cecum, identified by appendiceal                         orifice and ileocecal valve. The colonoscopy was                         performed without difficulty. The patient tolerated                          the procedure well. The quality of the bowel                         preparation was good except the sigmoid colon was                         fair. The ileocecal valve, appendiceal orifice, and                         rectum were photographed. Findings:      The perianal and digital rectal examinations were normal. Pertinent       negatives include normal sphincter tone and no palpable rectal lesions.      A large amount of stool was found in the sigmoid colon, interfering with       visualization. Lavage of the area was performed using a moderate amount       of sterile water, resulting in clearance with fair visualization.      Non-bleeding internal hemorrhoids were found during retroflexion. The       hemorrhoids were Grade I (internal hemorrhoids that do not prolapse).      A 4 mm polyp was found in the transverse colon. The polyp was sessile.       The polyp was removed with a jumbo cold forceps. Resection and retrieval       were complete.      The exam was otherwise without abnormality. Impression:            - Stool in the sigmoid colon.                        - Non-bleeding internal hemorrhoids.                        - The examination was otherwise normal.                        - No specimens collected. Recommendation:        - Await pathology results from EGD, also performed                         today.                        - Patient has a contact number available for  emergencies. The signs and symptoms of potential                         delayed complications were discussed with the patient.                         Return to normal activities tomorrow. Written                         discharge instructions were provided to the patient.                        - Resume previous diet.                        - Continue present medications.                        - No repeat colonoscopy due to current age (62 years                          or older) and the absence of advanced adenomas.                        - You do NOT require further colon cancer screening                         measures (Annual stool testing (i.e. hemoccult, FIT,                         cologuard), sigmoidoscopy, colonoscopy or CT                         colonography). You should share this recommendation                         with your Primary Care provider.                        - Consider capsule endoscopy of the small intestine                         for further evaluation if warranted.                        - The findings and recommendations were discussed with                         the patient.                        - Return to GI office in 3 months.                        - The findings and recommendations were discussed with                         the patient. Procedure Code(s):     --- Professional ---  45380, Colonoscopy, flexible; with biopsy, single or                         multiple Diagnosis Code(s):     --- Professional ---                        D50.0, Iron deficiency anemia secondary to blood loss                         (chronic)                        K64.0, First degree hemorrhoids CPT copyright 2019 American Medical Association. All rights reserved. The codes documented in this report are preliminary and upon coder review may  be revised to meet current compliance requirements. Efrain Sella MD, MD 04/27/2020 12:34:38 PM This report has been signed electronically. Number of Addenda: 0 Note Initiated On: 04/27/2020 11:46 AM Scope Withdrawal Time: 0 hours 5 minutes 33 seconds  Total Procedure Duration: 0 hours 14 minutes 40 seconds  Estimated Blood Loss:  Estimated blood loss: none. Estimated blood loss: none.      [facility]

## 2020-04-27 NOTE — Transfer of Care (Signed)
Immediate Anesthesia Transfer of Care Note  Patient: Jeremiah [name].  Procedure(s) Performed: COLONOSCOPY WITH PROPOFOL (N/A ) ESOPHAGOGASTRODUODENOSCOPY (EGD) WITH PROPOFOL (N/A )  Patient Location: PACU and Endoscopy Unit  Anesthesia Type:General  Level of Consciousness: drowsy  Airway & Oxygen Therapy: Patient Spontanous Breathing  Post-op Assessment: Report given to RN and Post -op Vital signs reviewed and stable  Post vital signs: Reviewed and stable  Last Vitals:  Vitals Value Taken Time  BP 88/64 04/27/20 1231  Temp 36.5 C 04/27/20 1230  Pulse 86 04/27/20 1232  Resp 12 04/27/20 1232  SpO2 97 % 04/27/20 1232  Vitals shown include unvalidated device data.  Last Pain:  Vitals:   04/27/20 1230  TempSrc: Temporal  PainSc: Asleep         Complications: No complications documented.

## 2020-04-28 ENCOUNTER — Encounter: Payer: Self-pay | Admitting: Internal Medicine

## 2020-04-28 LAB — SURGICAL PATHOLOGY

## 2020-04-28 NOTE — Anesthesia Postprocedure Evaluation (Signed)
Anesthesia Post Note  Patient: Jeremiah [name].  Procedure(s) Performed: COLONOSCOPY WITH PROPOFOL (N/A ) ESOPHAGOGASTRODUODENOSCOPY (EGD) WITH PROPOFOL (N/A )  Patient location during evaluation: PACU Anesthesia Type: General Level of consciousness: awake and alert Pain management: pain level controlled Vital Signs Assessment: post-procedure vital signs reviewed and stable Respiratory status: spontaneous breathing, nonlabored ventilation, respiratory function stable and patient connected to nasal cannula oxygen Cardiovascular status: blood pressure returned to baseline and stable Postop Assessment: no apparent nausea or vomiting Anesthetic complications: no   No complications documented.   Last Vitals:  Vitals:   04/27/20 1230 04/27/20 1240  BP: (!) 88/64 102/74  Pulse: 87 79  Resp: 11 13  Temp: 36.5 C   SpO2: 97% 99%    Last Pain:  Vitals:   04/28/20 0738  TempSrc:   PainSc: 0-No pain                 Molli Barrows

## 2020-04-28 NOTE — Anesthesia Preprocedure Evaluation (Signed)
Anesthesia Evaluation  Patient identified by MRN, date of birth, ID band Patient awake    Reviewed: Allergy & Precautions, H&P , NPO status , Patient's Chart, lab work & pertinent test results, reviewed documented beta blocker date and time   Airway Mallampati: II   Neck ROM: full    Dental  (+) Poor Dentition   Pulmonary neg pulmonary ROS, former smoker,    Pulmonary exam normal        Cardiovascular hypertension, + Peripheral Vascular Disease and +CHF  Normal cardiovascular exam+ dysrhythmias  Rhythm:regular Rate:Normal     Neuro/Psych Seizures -,  Anxiety Depression  Neuromuscular disease negative psych ROS   GI/Hepatic Neg liver ROS, GERD  ,  Endo/Other  negative endocrine ROSdiabetes  Renal/GU Renal disease  negative genitourinary   Musculoskeletal   Abdominal   Peds  Hematology  (+) Blood dyscrasia, anemia ,   Anesthesia Other Findings Past Medical History: No date: Allergy No date: Anemia No date: Anxiety No date: Arthritis No date: CHF (congestive heart failure) (H[facility]) No date: Collagen vascular disease (H[facility]) No date: Depression No date: Diabetes mellitus without complication ([facility]) No date: GERD (gastroesophageal reflux disease) No date: Hypertension No date: Rheumatoid arthritis ([place]) No date: Seizures ([place]) 05/23/2015: Sepsis ([place]) Past Surgical History: No date: BACK SURGERY 04/2016: back surgery five times No date: CARPAL TUNNEL RELEASE No date: HERNIA REPAIR No date: JOINT REPLACEMENT No date: knee replacement right 04/24/2019: PEG PLACEMENT; N/A     Comment:  Procedure: PERCUTANEOUS ENDOSCOPIC GASTROSTOMY (PEG)               PLACEMENT;  Surgeon: [place], Benay Pike, MD;  Location:               [facility] ENDOSCOPY;  Service: Gastroenterology;  Laterality:               N/A; No date: right hip replacement No date: rotator cuff surgery No date: SPINE SURGERY BMI    Body Mass Index: 32.55 kg/m      Reproductive/Obstetrics negative OB ROS                             Anesthesia Physical Anesthesia Plan  ASA: III  Anesthesia Plan: General   Post-op Pain Management:    Induction:   PONV Risk Score and Plan:   Airway Management Planned:   Additional Equipment:   Intra-op Plan:   Post-operative Plan:   Informed Consent: I have reviewed the patients History and Physical, chart, labs and discussed the procedure including the risks, benefits and alternatives for the proposed anesthesia with the patient or authorized representative who has indicated his/her understanding and acceptance.     Dental Advisory Given  Plan Discussed with: CRNA  Anesthesia Plan Comments:         Anesthesia Quick Evaluation

## 2020-05-25 NOTE — Progress Notes (Signed)
[facility]  [address], [address] [place], [place] [address] Phone: [phone]  Fax: [phone]   Clinic Day:  05/26/2020  Referring physician: Derinda Late, MD  Chief Complaint: Jeremiah [name]. is a 80 y.o. male with rheumatoid arthritis previously on methotrexate, a history of pancytopenia, folate deficiency, and a monoclonal B cell lymphocytosis who is seen for 3 month assessment.  HPI: The patient was last seen in the hematology clinic on 02/24/2020. At that time, he felt good.  He denied any bleeding.  Exam was stable. Hematocrit was 41.6, hemoglobin 12.8, platelets 182,000, WBC 5,400 ([place] 3200; ALC 1300). Ferritin was 52. He continued oral iron.  Surveillance continued.  Colonoscopy on 04/27/2020 by Dr. Alice Reichert revealed stool in the sigmoid colon. There were non-bleeding internal hemorrhoids. There was a 4 mm polyp in the transverse colon (tubular adenoma). EGD was normal.  During the interim, he has been "good." He has been eating well. His arthritis is managed very well with Arava (leflunomide) and has not been bothering him. He has occasional constipation. His legs are swollen. He denies fevers, sweats, lumps, bumps, infections, nausea, vomiting, and diarrhea.  He can get dressed on his own if he takes his time. He is able to shower on his own, he just needs help getting in and out of the shower. Per his wife, he has improved immensely in terms on what he is able to do. He uses a walker at all times.  He takes vitamin B1, iron, folic acid, and magnesium.   Past Medical History:  Diagnosis Date  . Allergy   . Anemia   . Anxiety   . Arthritis   . CHF (congestive heart failure) ([place])   . Collagen vascular disease ([place])   . Depression   . Diabetes mellitus without complication ([place])   . GERD (gastroesophageal reflux disease)   . Hypertension   . Rheumatoid arthritis ([place])   . Seizures ([place])   . Sepsis ([place]) 05/23/2015    Past Surgical  History:  Procedure Laterality Date  . BACK SURGERY    . back surgery five times  04/2016  . CARPAL TUNNEL RELEASE    . COLONOSCOPY WITH PROPOFOL N/A 04/27/2020   Procedure: COLONOSCOPY WITH PROPOFOL;  Surgeon: Toledo, Benay Pike, MD;  Location: [facility] ENDOSCOPY;  Service: Gastroenterology;  Laterality: N/A;  . ESOPHAGOGASTRODUODENOSCOPY (EGD) WITH PROPOFOL N/A 04/27/2020   Procedure: ESOPHAGOGASTRODUODENOSCOPY (EGD) WITH PROPOFOL;  Surgeon: Toledo, Benay Pike, MD;  Location: [facility] ENDOSCOPY;  Service: Gastroenterology;  Laterality: N/A;  . HERNIA REPAIR    . JOINT REPLACEMENT    . knee replacement right    . PEG PLACEMENT N/A 04/24/2019   Procedure: PERCUTANEOUS ENDOSCOPIC GASTROSTOMY (PEG) PLACEMENT;  Surgeon: Toledo, Benay Pike, MD;  Location: [facility] ENDOSCOPY;  Service: Gastroenterology;  Laterality: N/A;  . right hip replacement    . rotator cuff surgery    . SPINE SURGERY      Family History  Problem Relation Age of Onset  . Hypertension Other   . Hypertension Mother   . Dementia Mother   . Dementia Father   . Prostate cancer Neg Hx   . Bladder Cancer Neg Hx   . Kidney cancer Neg Hx     Social History:  reports that he has quit smoking. His smoking use included cigarettes. He has a 20.00 pack-year smoking history. He has never used smokeless tobacco. He reports that he does not drink alcohol and does not use drugs.  He  denies any exposure to radiation or toxins.  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  He lives in [place].  His wife's name is [name]. The patient is accompanied by his wife, [name], today.   Allergies:  Allergies  Allergen Reactions  . Cefoperazone Anaphylaxis  . Penicillins Anaphylaxis and Other (See Comments)    Has patient had a PCN reaction causing immediate rash, facial/tongue/throat swelling, SOB or lightheadedness with hypotension: Yes Has patient had a PCN reaction causing  severe rash involving mucus membranes or skin necrosis: No Has patient had a PCN reaction that required hospitalization No Has patient had a PCN reaction occurring within the last 10 years: No If all of the above answers are "NO", then may proceed with Cephalosporin use.  . Latex Rash    Current Medications: Current Outpatient Medications  Medication Sig Dispense Refill  . aspirin 81 MG chewable tablet Chew 81 mg by mouth daily.    . cholecalciferol (VITAMIN D) 25 MCG tablet Take 1 tablet (1,000 Units total) by mouth daily. 30 tablet 0  . ferrous sulfate 325 (65 FE) MG tablet Take by mouth.    . folic acid (FOLVITE) 1 MG tablet Take 1 tablet (1 mg total) by mouth daily. 30 tablet 1  . furosemide (LASIX) 40 MG tablet Take 40 mg by mouth daily.     Marland Kitchen gabapentin (NEURONTIN) 300 MG capsule TAKE 1 CAPSULE (300 MG TOTAL) BY MOUTH 4 (FOUR) TIMES DAILY. QHS    . hydroxychloroquine (PLAQUENIL) 200 MG tablet Take 200 mg by mouth daily.    Marland Kitchen lamoTRIgine (LAMICTAL) 200 MG tablet Take 200 mg by mouth 2 (two) times daily.    Marland Kitchen lamoTRIgine (LAMICTAL) 25 MG tablet Take 25 mg by mouth 2 (two) times daily.    Marland Kitchen leflunomide (ARAVA) 10 MG tablet     . magnesium oxide (MAG-OX) 400 MG tablet Take 400 mg by mouth daily.    . metoprolol tartrate (LOPRESSOR) 25 MG tablet Take 1 tablet (25 mg total) by mouth 2 (two) times daily. 60 tablet 11  . omeprazole (PRILOSEC) 40 MG capsule Take 40 mg by mouth daily as needed.     . polyethylene glycol (MIRALAX / GLYCOLAX) 17 g packet Take 17 g by mouth daily as needed. 14 each 0  . predniSONE (DELTASONE) 5 MG tablet 4 tabs po day1, 3 tabs po day2; 2 tabs po day3,4; 1 tab po day5,6; 1/2 tab po day7,8,9,10 15 tablet 0  . thiamine 100 MG tablet Take 1 tablet (100 mg total) by mouth daily. 30 tablet 0  . traZODone (DESYREL) 50 MG tablet Take 50 mg by mouth at bedtime.    Marland Kitchen acetaminophen (TYLENOL) 325 MG tablet Take 2 tablets (650 mg total) by mouth every 6 (six) hours as needed  for mild pain (or Fever >/= 101). (Patient not taking: Reported on 05/26/2020)    . ascorbic acid (VITAMIN C) 500 MG tablet Take 1 tablet (500 mg total) by mouth daily. (Patient not taking: Reported on 05/26/2020) 30 tablet 0  . Etanercept (ENBREL ) Inject into the skin. (Patient not taking: Reported on 05/26/2020)    . feeding supplement, ENSURE ENLIVE, (ENSURE ENLIVE) LIQD Take 237 mLs by mouth 3 (three) times daily between meals. (Patient not taking: Reported on 05/26/2020) 21330 mL 0  . nystatin (MYCOSTATIN) 100000 UNIT/ML suspension Take 5 mLs (500,000 Units total) by mouth  4 (four) times daily. (Patient not taking: Reported on 05/26/2020) 60 mL 0  . zinc sulfate 220 (50 Zn) MG capsule Take 1 capsule (220 mg total) by mouth daily. (Patient not taking: Reported on 05/26/2020) 30 capsule 0   No current facility-administered medications for this visit.    Review of Systems  Constitutional: Negative for chills, diaphoresis, fever, malaise/fatigue and weight loss (fluctuates between 208 and 240 lbs).       Feels "good."  HENT: Negative.  Negative for congestion, ear discharge, ear pain, hearing loss, nosebleeds, sinus pain, sore throat and tinnitus.   Eyes: Negative.  Negative for blurred vision.  Respiratory: Negative.  Negative for cough, hemoptysis, sputum production and shortness of breath.   Cardiovascular: Positive for leg swelling. Negative for chest pain and palpitations.  Gastrointestinal: Positive for constipation (occasional). Negative for abdominal pain, blood in stool, diarrhea, heartburn, melena, nausea and vomiting.       Eating well  Genitourinary: Negative.  Negative for dysuria, frequency, hematuria and urgency.  Musculoskeletal: Positive for joint pain (rheumatoid and osteoarthritis). Negative for back pain, falls, myalgias and neck pain.  Skin: Negative.  Negative for itching and rash.  Neurological: Negative for dizziness, tingling, sensory change, weakness and headaches.       Uses  a walker all the time. Strength continues to improve.  Endo/Heme/Allergies: Negative.  Does not bruise/bleed easily.  Psychiatric/Behavioral: Negative.  Negative for depression and memory loss. The patient is not nervous/anxious and does not have insomnia.   All other systems reviewed and are negative.  Performance status (ECOG):  2  Vitals Blood pressure 110/77, pulse 92, temperature (!) 96.9 F (36.1 C), temperature source Tympanic, resp. rate 18, weight 229 lb 4.5 oz (104 kg), SpO2 100 %.   Physical Exam Vitals and nursing note reviewed.  Constitutional:      General: He is not in acute distress.    Appearance: He is well-developed.     Comments: Patient was examined in a wheelchair.  HENT:     Head: Normocephalic and atraumatic.     Comments: Short gray hair.  Male pattern baldness.    Mouth/Throat:     Mouth: Mucous membranes are moist.     Pharynx: Oropharynx is clear.  Eyes:     General: No scleral icterus.    Extraocular Movements: Extraocular movements intact.     Conjunctiva/sclera: Conjunctivae normal.     Pupils: Pupils are equal, round, and reactive to light.     Comments: Glasses.  Blue eyes.  Cardiovascular:     Rate and Rhythm: Normal rate and regular rhythm.     Heart sounds: Normal heart sounds. No murmur heard.   Pulmonary:     Effort: Pulmonary effort is normal. No respiratory distress.     Breath sounds: Normal breath sounds. No wheezing or rales.  Chest:     Chest wall: No tenderness.  Breasts:     Right: No axillary adenopathy or supraclavicular adenopathy.     Left: No axillary adenopathy or supraclavicular adenopathy.    Abdominal:     General: Bowel sounds are normal. There is no distension.     Palpations: Abdomen is soft. There is no hepatomegaly, splenomegaly or mass.     Tenderness: There is no abdominal tenderness. There is no guarding or rebound.  Musculoskeletal:        General: No tenderness. Normal range of motion.     Cervical  back: Normal range of motion and neck supple.  Right lower leg: Edema (3+ ankle) present.     Left lower leg: Edema (3+ ankle) present.  Lymphadenopathy:     Head:     Right side of head: No preauricular, posterior auricular or occipital adenopathy.     Left side of head: No preauricular, posterior auricular or occipital adenopathy.     Cervical: No cervical adenopathy.     Upper Body:     Right upper body: No supraclavicular or axillary adenopathy.     Left upper body: No supraclavicular or axillary adenopathy.     Lower Body: No right inguinal adenopathy. No left inguinal adenopathy.  Skin:    General: Skin is warm and dry.     Comments: Chronic changes in legs  Neurological:     Mental Status: He is alert and oriented to person, place, and time.  Psychiatric:        Behavior: Behavior normal.        Thought Content: Thought content normal.        Judgment: Judgment normal.    No visits with results within 3 Day(s) from this visit.  Latest known visit with results is:  Admission on 04/27/2020, Discharged on 04/27/2020  Component Date Value Ref Range Status  . SURGICAL PATHOLOGY 04/27/2020    Final-Edited                   Value:SURGICAL PATHOLOGY CASE: [id number] PATIENT: Jeremiah [name] Surgical Pathology Report  Specimen Submitted: A. Colon polyp, transverse; cbx  Clinical History: IDA, rectal bleeding, weight loss.  Normal EGD, internal hemorrhoids, colon polyp.  DIAGNOSIS: A.  COLON POLYP, TRANSVERSE; COLD BIOPSY: - TUBULAR ADENOMA. - NEGATIVE FOR HIGH-GRADE DYSPLASIA AND MALIGNANCY.  GROSS DESCRIPTION: A. Labeled: Transverse colon polyp cbx Received: Formalin Tissue fragment(s): 1 Size: 0.4 cm Description: Tan soft tissue fragment Entirely submitted in 1 cassette.  Final Diagnosis performed by Quay Burow, MD.   Electronically signed 04/28/2020 11:51:46AM The electronic signature indicates that the named Attending Pathologist has evaluated the  specimen Technical component performed at [facility], [address] [place], [place] [address] Lab: [phone] Dir: Rush Farmer, MD, MMM  Professional component performed at [facility], [facility],                          [place], [place], [place] [address] Lab: [phone] Dir: Dellia Nims. Reuel Derby, MD    Assessment:  Jeremiah Butt. is a 80 y.o. male withrheumatoid arthritis and a history of pancytopenia s/p methotrexate toxicity in 03/2019.  He had not been taking folic acid.  He had folic acid deficiency and was started on oral folate. Nadir counts on 04/05/2019 revealed a hematocrit of 27.0, hemoglobin 8.9, MCV 85.2, platelets 81,000, and WBC 1000 (ANC 200).  He received GCSF (Granix) from 04/11/2019 - 04/14/2019.  Counts on 04/24/2019 revealed a hematocrit of 26.6, hemoglobin 8.6, platelets 570,000, and WBC 15,200.  Peripheral blood flow cytometry on 04/09/2020 revealed  1% myeloblasts in the setting of pancytopenia and phenotypic aberrancies of neutrophils and monocytes.  There was a CD5-, CD10- clonal B cell population, non specific phenotype, 2% of leukocytes (< 5000/uL) c/w a monoclonal B-cell lymphocytosis.  Bone marrow on 04/10/2019 revealed a hypercellular marrow with erythroid hyperplasia, rare ringed sideroblasts and dyspoiesis. There was polytic plasmacytosis (6-10%). The plasma cells coexpressed CD56 (unusual) but appeared polytypic by kappa and lambda in situ hybridization.  There were no increased blasts. Cytogenetics were normal (88, XX).  Bone marrow findings were felt likely related to methotrexate or possibly a myelodysplastic syndrome (MDS).  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  MYD88  L265P can be seen in lymphoplasmacytic lymphoma/Wldenstrom's macroglobulinemia, IgM MGUS, CLL, marginal zone lymphoma, splenic lymphoma, and some non-germinal center subtype diffuse large B  celll lymphomas.  Flow cytometry on 07/09/2019 revealed a CD5, CD10, and CD103 negative monoclonal B cell population , non specific phenotype, <1% of leukocytes, < 5,000/uL.  This represents a monoclonal B-cell lymphocytosis.  This is not the typical phenotype for CLL, hairy cell leukemia, or follicular center cell lymphoma.  SPEP on 07/09/2019 and 05/26/2020 revealed no monoclonal protein; immunoglobulins were normal on 07/09/2019.  He last received methotrexate on 06/22/2019 and 06/29/2019.  He has a history of folic acid deficiency.   Folate was 3.4 on 04/06/2019, 11.5 on 04/22/2019, and 11.4 on 07/08/2019.  B12 was 606 on 04/06/2019.  He has a history of iron deficiency.  RBC indices were microcytic on 01/21/2020.  Ferritin was 52 with an iron saturation of 7% (low).  Colonoscopy on 04/27/2020 revealed stool in the sigmoid colon. There were non-bleeding internal hemorrhoids. There was a 4 mm polyp in the transverse colon (tubular adenoma). EGD on 04/27/2020 was normal.  He was admitted to [facility] from 07/02/2019 to 07/10/2019 with weakness, fever of 102.1, chills and acute encephalopathy. He was diffusely painful to touch. COVID-19 testing was positive by PCR. Influenza A and B were negative. Blood cultures were negative. Urine cultures grew multiple species.  He received broad spectrum antibiotics and remdesivir.  He was treated with Solumedrol for a rheumatoid flare and discharged on a slow prednisone taper.  He received IV then oral folic acid.  Chest CT angiogram on 07/05/2019 revealed no pulmonary embolism.  There were no enlarged mediastinal, hilar or axillary adenopathy.  Bilateral lower extremity duplex on 07/05/2019 showed no evidence of DVT in either lower extremity.  Symptomatically, he feels "good."  His arthritis is well managed.  He denies fevers, sweats, adenopathy, infections, nausea, vomiting, and diarrhea.  Plan: 1.   Labs today CBC with diff, ferritin, iron studies, SPEP,  folate. 2.   Monoclonal B-cell lymphocytosis  Hematocrit 43.2.  Hemoglobin 14.1.  MCV 83.1.  Platelets 207,000.  WBC 6200 with an ANC of 3600 and an ALC of 1700.  Flow cytometry on 07/09/2019 revealed monoclonal B-cell population likely representing a monoclonal B-cell lymphocytosis.  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  SPEP revealed no monoclonal protein today.  Chest CT angiogram on 07/05/2019 revealed no adenopathy.  Symptomatically, he is doing well.  Exam reveals no adenopathy or hepatosplenomegaly.  Discuss monitoring labs every 6 months. 3.   Iron deficiency   Hematocrit 43.2.  Hemoglobin  14.1.  MCV 83.1.   Ferritin is 82 with an iron saturation of 37% and a TIBC 266 (available after clinic).  Microcytic RBC indices have resolved.  Colonoscopy on 04/27/2020 revealed a 4 mm polyp in the transverse colon (tubular adenoma).   EGD on 04/27/2020 was normal.  He is on ferrous sulfate 1 tablet p.o. daily.  Consider trial off oral iron. 4.   Folic acid deficiency  Patient initially developed folic acid deficiency when not taking supplementation on  methotrexate.  Folate was 11.4 on 07/08/2019 and 36 today.  Patient off MTX and on Enbrel.  Monitor periodically. 5.   Weight loss  Patient notes weight fluctuates.  He is eating well.  Continue to monitor. 6.   RTC in 6 months for labs (CBC with diff, ferritin). 7.   RTC in 1 year for MD assessment, labs (CBC with diff, LDH, ferritin, iron studies, folate).  I discussed the assessment and treatment plan with the patient.  The patient was provided an opportunity to ask questions and all were answered.  The patient agreed with the plan and demonstrated an understanding of the instructions.  The patient was advised to call back if the symptoms worsen or if the condition fails to improve as  anticipated.  I provided 15 minutes of face-to-face time during this this encounter and > 50% was spent counseling as documented under my assessment and plan.  An additional 5-10 minutes were spent reviewing his chart (Epic and Care Everywhere) including notes, labs, and imaging studies.    Donyale Berthold C. Mike Gip, MD, PhD    05/26/2020, 11:56 AM  I, Mirian Mo Tufford, am acting as Education administrator for [employer]. Mike Gip, MD, PhD.  I, Jlynn Ly C. Mike Gip, MD, have reviewed the above documentation for accuracy and completeness, and I agree with the above.

## 2020-05-26 ENCOUNTER — Inpatient Hospital Stay: Payer: Medicare PPO | Attending: Hematology and Oncology | Admitting: Hematology and Oncology

## 2020-05-26 ENCOUNTER — Other Ambulatory Visit: Payer: Self-pay | Admitting: Hematology and Oncology

## 2020-05-26 ENCOUNTER — Inpatient Hospital Stay: Payer: Medicare PPO

## 2020-05-26 ENCOUNTER — Other Ambulatory Visit: Payer: Self-pay

## 2020-05-26 ENCOUNTER — Encounter: Payer: Self-pay | Admitting: Hematology and Oncology

## 2020-05-26 VITALS — BP 110/77 | HR 92 | Temp 96.9°F | Resp 18 | Wt 229.3 lb

## 2020-05-26 DIAGNOSIS — K219 Gastro-esophageal reflux disease without esophagitis: Secondary | ICD-10-CM | POA: Diagnosis not present

## 2020-05-26 DIAGNOSIS — E119 Type 2 diabetes mellitus without complications: Secondary | ICD-10-CM | POA: Diagnosis not present

## 2020-05-26 DIAGNOSIS — Z87891 Personal history of nicotine dependence: Secondary | ICD-10-CM | POA: Diagnosis not present

## 2020-05-26 DIAGNOSIS — D72820 Lymphocytosis (symptomatic): Secondary | ICD-10-CM

## 2020-05-26 DIAGNOSIS — E538 Deficiency of other specified B group vitamins: Secondary | ICD-10-CM | POA: Insufficient documentation

## 2020-05-26 DIAGNOSIS — Z79899 Other long term (current) drug therapy: Secondary | ICD-10-CM | POA: Diagnosis not present

## 2020-05-26 DIAGNOSIS — D61818 Other pancytopenia: Secondary | ICD-10-CM | POA: Insufficient documentation

## 2020-05-26 DIAGNOSIS — Z7982 Long term (current) use of aspirin: Secondary | ICD-10-CM | POA: Insufficient documentation

## 2020-05-26 DIAGNOSIS — E611 Iron deficiency: Secondary | ICD-10-CM | POA: Diagnosis not present

## 2020-05-26 DIAGNOSIS — M069 Rheumatoid arthritis, unspecified: Secondary | ICD-10-CM | POA: Insufficient documentation

## 2020-05-26 DIAGNOSIS — D509 Iron deficiency anemia, unspecified: Secondary | ICD-10-CM

## 2020-05-26 DIAGNOSIS — R634 Abnormal weight loss: Secondary | ICD-10-CM | POA: Diagnosis not present

## 2020-05-26 DIAGNOSIS — K5900 Constipation, unspecified: Secondary | ICD-10-CM | POA: Insufficient documentation

## 2020-05-26 DIAGNOSIS — R600 Localized edema: Secondary | ICD-10-CM | POA: Insufficient documentation

## 2020-05-26 DIAGNOSIS — Z7952 Long term (current) use of systemic steroids: Secondary | ICD-10-CM | POA: Insufficient documentation

## 2020-05-26 DIAGNOSIS — D123 Benign neoplasm of transverse colon: Secondary | ICD-10-CM | POA: Insufficient documentation

## 2020-05-26 DIAGNOSIS — I110 Hypertensive heart disease with heart failure: Secondary | ICD-10-CM | POA: Insufficient documentation

## 2020-05-26 DIAGNOSIS — F418 Other specified anxiety disorders: Secondary | ICD-10-CM | POA: Diagnosis not present

## 2020-05-26 DIAGNOSIS — K625 Hemorrhage of anus and rectum: Secondary | ICD-10-CM | POA: Diagnosis not present

## 2020-05-26 LAB — CBC WITH DIFFERENTIAL/PLATELET
Abs Immature Granulocytes: 0.03 10*3/uL (ref 0.00–0.07)
Basophils Absolute: 0.1 10*3/uL (ref 0.0–0.1)
Basophils Relative: 1 %
Eosinophils Absolute: 0.1 10*3/uL (ref 0.0–0.5)
Eosinophils Relative: 2 %
HCT: 43.2 % (ref 39.0–52.0)
Hemoglobin: 14.1 g/dL (ref 13.0–17.0)
Immature Granulocytes: 1 %
Lymphocytes Relative: 27 %
Lymphs Abs: 1.7 10*3/uL (ref 0.7–4.0)
MCH: 27.1 pg (ref 26.0–34.0)
MCHC: 32.6 g/dL (ref 30.0–36.0)
MCV: 83.1 fL (ref 80.0–100.0)
Monocytes Absolute: 0.7 10*3/uL (ref 0.1–1.0)
Monocytes Relative: 11 %
Neutro Abs: 3.6 10*3/uL (ref 1.7–7.7)
Neutrophils Relative %: 58 %
Platelets: 207 10*3/uL (ref 150–400)
RBC: 5.2 MIL/uL (ref 4.22–5.81)
RDW: 16.1 % — ABNORMAL HIGH (ref 11.5–15.5)
WBC: 6.2 10*3/uL (ref 4.0–10.5)
nRBC: 0 % (ref 0.0–0.2)

## 2020-05-26 LAB — IRON AND TIBC
Iron: 97 ug/dL (ref 45–182)
Saturation Ratios: 37 % (ref 17.9–39.5)
TIBC: 266 ug/dL (ref 250–450)
UIBC: 169 ug/dL

## 2020-05-26 LAB — FOLATE: Folate: 36 ng/mL (ref >5.9)

## 2020-05-26 LAB — FERRITIN: Ferritin: 82 ng/mL (ref 24–336)

## 2020-05-26 NOTE — Patient Instructions (Signed)
  Monoclonal B cell lymphocytosis 

## 2020-05-26 NOTE — Progress Notes (Unsigned)
Patient here for oncology follow-up appointment, expresses concerns of leg swelling  

## 2020-05-27 LAB — PROTEIN ELECTROPHORESIS, SERUM
A/G Ratio: 1.2 (ref 0.7–1.7)
Albumin ELP: 3.4 g/dL (ref 2.9–4.4)
Alpha-1-Globulin: 0.2 g/dL (ref 0.0–0.4)
Alpha-2-Globulin: 0.7 g/dL (ref 0.4–1.0)
Beta Globulin: 1 g/dL (ref 0.7–1.3)
Gamma Globulin: 1 g/dL (ref 0.4–1.8)
Globulin, Total: 2.9 g/dL (ref 2.2–3.9)
Total Protein ELP: 6.3 g/dL (ref 6.0–8.5)

## 2020-05-30 ENCOUNTER — Telehealth: Payer: Self-pay

## 2020-05-30 NOTE — Telephone Encounter (Signed)
-----   Message from Lequita Asal, MD sent at 05/28/2020  8:15 PM EST ----- Regarding: Please call patient  Ferritin and iron studies good.  Consider trial off iron.  M

## 2020-07-23 ENCOUNTER — Emergency Department
Admission: EM | Admit: 2020-07-23 | Discharge: 2020-07-23 | Disposition: A | Discharge status: Home / Self Care | Payer: Medicare PPO | Attending: Emergency Medicine | Admitting: Emergency Medicine

## 2020-07-23 ENCOUNTER — Other Ambulatory Visit: Payer: Self-pay

## 2020-07-23 ENCOUNTER — Emergency Department: Payer: Medicare PPO

## 2020-07-23 DIAGNOSIS — Z96651 Presence of right artificial knee joint: Secondary | ICD-10-CM | POA: Diagnosis not present

## 2020-07-23 DIAGNOSIS — Z8616 Personal history of COVID-19: Secondary | ICD-10-CM | POA: Diagnosis not present

## 2020-07-23 DIAGNOSIS — I110 Hypertensive heart disease with heart failure: Secondary | ICD-10-CM | POA: Insufficient documentation

## 2020-07-23 DIAGNOSIS — Z87891 Personal history of nicotine dependence: Secondary | ICD-10-CM | POA: Insufficient documentation

## 2020-07-23 DIAGNOSIS — E119 Type 2 diabetes mellitus without complications: Secondary | ICD-10-CM | POA: Insufficient documentation

## 2020-07-23 DIAGNOSIS — Z79899 Other long term (current) drug therapy: Secondary | ICD-10-CM | POA: Diagnosis not present

## 2020-07-23 DIAGNOSIS — Z91040 Latex allergy status: Secondary | ICD-10-CM | POA: Insufficient documentation

## 2020-07-23 DIAGNOSIS — I509 Heart failure, unspecified: Secondary | ICD-10-CM | POA: Diagnosis not present

## 2020-07-23 DIAGNOSIS — R5383 Other fatigue: Secondary | ICD-10-CM

## 2020-07-23 DIAGNOSIS — N3090 Cystitis, unspecified without hematuria: Secondary | ICD-10-CM | POA: Insufficient documentation

## 2020-07-23 DIAGNOSIS — Z7982 Long term (current) use of aspirin: Secondary | ICD-10-CM | POA: Insufficient documentation

## 2020-07-23 DIAGNOSIS — Z96641 Presence of right artificial hip joint: Secondary | ICD-10-CM | POA: Insufficient documentation

## 2020-07-23 LAB — CBC WITH DIFFERENTIAL/PLATELET
Abs Immature Granulocytes: 0.02 10*3/uL (ref 0.00–0.07)
Basophils Absolute: 0.1 10*3/uL (ref 0.0–0.1)
Basophils Relative: 1 %
Eosinophils Absolute: 0 10*3/uL (ref 0.0–0.5)
Eosinophils Relative: 0 %
HCT: 41.7 % (ref 39.0–52.0)
Hemoglobin: 13.2 g/dL (ref 13.0–17.0)
Immature Granulocytes: 0 %
Lymphocytes Relative: 6 %
Lymphs Abs: 0.3 10*3/uL — ABNORMAL LOW (ref 0.7–4.0)
MCH: 27.3 pg (ref 26.0–34.0)
MCHC: 31.7 g/dL (ref 30.0–36.0)
MCV: 86.3 fL (ref 80.0–100.0)
Monocytes Absolute: 0.6 10*3/uL (ref 0.1–1.0)
Monocytes Relative: 11 %
Neutro Abs: 4 10*3/uL (ref 1.7–7.7)
Neutrophils Relative %: 82 %
Platelets: 162 10*3/uL (ref 150–400)
RBC: 4.83 MIL/uL (ref 4.22–5.81)
RDW: 15.3 % (ref 11.5–15.5)
WBC: 4.9 10*3/uL (ref 4.0–10.5)
nRBC: 0 % (ref 0.0–0.2)

## 2020-07-23 LAB — URINALYSIS, COMPLETE (UACMP) WITH MICROSCOPIC
Bacteria, UA: NONE SEEN
Bilirubin Urine: NEGATIVE
Glucose, UA: NEGATIVE mg/dL
Hgb urine dipstick: NEGATIVE
Ketones, ur: 5 mg/dL — AB
Nitrite: NEGATIVE
Protein, ur: 30 mg/dL — AB
Specific Gravity, Urine: 1.032 — ABNORMAL HIGH (ref 1.005–1.030)
WBC, UA: >50 WBC/hpf — ABNORMAL HIGH (ref 0–5)
pH: 7 (ref 5.0–8.0)

## 2020-07-23 LAB — BASIC METABOLIC PANEL
Anion gap: 11 (ref 5–15)
BUN: 14 mg/dL (ref 8–23)
CO2: 24 mmol/L (ref 22–32)
Calcium: 8.8 mg/dL — ABNORMAL LOW (ref 8.9–10.3)
Chloride: 105 mmol/L (ref 98–111)
Creatinine, Ser: 1.03 mg/dL (ref 0.61–1.24)
GFR, Estimated: >60 mL/min (ref >60)
Glucose, Bld: 96 mg/dL (ref 70–99)
Potassium: 3.7 mmol/L (ref 3.5–5.1)
Sodium: 140 mmol/L (ref 135–145)

## 2020-07-23 MED ORDER — SULFAMETHOXAZOLE-TRIMETHOPRIM 800-160 MG PO TABS: 1.0000 | ORAL_TABLET | Freq: Two times a day (BID) | ORAL | 0 refills | Status: DC

## 2020-07-23 MED ORDER — SULFAMETHOXAZOLE-TRIMETHOPRIM 800-160 MG PO TABS
1.0000 | ORAL_TABLET | Freq: Once | ORAL | Status: AC
  Administered 2020-07-23: 1 via ORAL
  Filled 2020-07-23: qty 1

## 2020-07-23 NOTE — ED Provider Notes (Signed)
[facility] Emergency Department Provider Note  ____________________________________________  Time seen: Approximately 7:14 PM  I have reviewed the triage vital signs and the nursing notes.   HISTORY  Chief Complaint Altered Mental Status    HPI Jeremiah [name]. is a 80 y.o. male with a history of CHF diabetes.  Hypertension who is brought to the ED due to appearing fatigued and less attentive today.  No falls or trauma.  No focal pain complaints.  Had a Covid booster vaccine yesterday.  No vomiting, eating and drinking normally.  Patient denies any complaints at all, denies pain, states that he feels fine.      Past Medical History:  Diagnosis Date  . Allergy   . Anemia   . Anxiety   . Arthritis   . CHF (congestive heart failure) ([place])   . Collagen vascular disease ([place])   . Depression   . Diabetes mellitus without complication ([place])   . GERD (gastroesophageal reflux disease)   . Hypertension   . Rheumatoid arthritis ([place])   . Seizures ([place])   . Sepsis ([place]) 05/23/2015     Patient Active Problem List   Diagnosis Date Noted  . Weight loss 12/24/2019  . Iron deficiency anemia 12/24/2019  . Monoclonal B-cell lymphocytosis   . Left wrist pain   . MDS (myelodysplastic syndrome) ([place])   . Fever   . Calf tenderness   . Encephalopathy acute   . Generalized weakness   . Hypokalemia   . COVID-19 virus infection 07/02/2019  . Dysphagia   . Oral mucositis 04/14/2019  . Neutropenia with fever ([place]) 04/11/2019  . Acute metabolic encephalopathy 32/44/0102  . Pancytopenia ([place])   . Cellulitis of lower extremity 04/08/2019  . Folate deficiency   . AKI (acute kidney injury) ([place])   . Pressure injury of skin 04/05/2019  . Other pancytopenia ([place])   . Cellulitis 03/31/2019  . Edema of both legs 08/19/2018  . History of allergy to latex 07/22/2018  . Lymphedema 06/16/2018  . Chronic venous insufficiency 06/16/2018  . Venous ulcer of leg ([place])  06/16/2018  . PAD (peripheral artery disease) ([place]) 06/16/2018  . Diabetes mellitus type 2, uncomplicated ([place]) 72/53/6644  . 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 Obesity (BMI 30.0-34.9) 02/27/2018  . Primary osteoarthritis of left knee 02/27/2018  . Urinary retention 02/25/2018  . Rheumatoid arthritis involving multiple sites ([place]) 02/25/2018  . Left knee pain 02/20/2018  . Neurogenic pain 10/02/2017  . Spondylosis without myelopathy or radiculopathy, lumbosacral region 07/17/2017  . Trigger point with back pain (recurrent/intermittent) 07/17/2017  . Chronic musculoskeletal pain 02/27/2017  . Myofascial pain syndrome 02/27/2017  . Long term current use of anticoagulant (Plaquenil) 02/27/2017  . Chronic lumbar radiculitis (L5 dermatome) (Right) 02/27/2017  . Chronic lumbar radiculitis (L3 dermatome) (Left) 02/27/2017  . Chronic foot pain (Right) 02/27/2017  . Chronic thoracic back pain (Bilateral) (R>L) 02/27/2017  . Chronic neck pain (posterior) (Bilateral) (R>L) 02/27/2017  . Chronic shoulder pain (Bilateral) (L>R) 02/27/2017  . 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 DDD (degenerative disc disease), lumbar 02/19/2017  . 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 Chronic knee pain (Bilateral) (R>L) 02/19/2017  . 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 Grade 1 Retrolisthesis of L1 over L2 02/19/2017  . T11 wedge compression fracture (Aprox. 1993) 02/19/2017  . Lumbar facet arthropathy 02/19/2017  . Lumbar facet syndrome (Bilateral) 02/19/2017  . Lumbar foraminal stenosis (  Bilateral: L1-2)  (Right: L3-4, L4-5) 02/19/2017  . 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 Disorder of skeletal system 02/19/2017  . Pharmacologic therapy 02/19/2017  . Problems influencing health status 02/19/2017  . Chronic pain syndrome 01/14/2017  . Gastroesophageal reflux disease 01/14/2017  . Hyperlipidemia 01/14/2017  .  Lumbar radiculopathy 01/14/2017  . Postoperative anemia due to acute blood loss 01/14/2017  . Postprocedural hypotension 01/14/2017  . Chronic lower extremity pain (Secondary Area of Pain) (Bilateral) (R>L) 01/14/2017  . Rheumatoid arthritis ([place]) 12/17/2013  . Diabetes ([place]) 12/17/2013  . Benign essential hypertension 11/13/2013  . Chronic low back pain (Primary Area of Pain) (Bilateral) (R>L) 11/13/2013  . Prostatitis 11/13/2013  . Seizure ([place]) 11/13/2013  . Left bundle branch block 05/21/2013  . Dorsalgia 05/21/2013     Past Surgical History:  Procedure Laterality Date  . BACK SURGERY    . back surgery five times  04/2016  . CARPAL TUNNEL RELEASE    . COLONOSCOPY WITH PROPOFOL N/A 04/27/2020   Procedure: COLONOSCOPY WITH PROPOFOL;  Surgeon: Toledo, Benay Pike, MD;  Location: [facility] ENDOSCOPY;  Service: Gastroenterology;  Laterality: N/A;  . ESOPHAGOGASTRODUODENOSCOPY (EGD) WITH PROPOFOL N/A 04/27/2020   Procedure: ESOPHAGOGASTRODUODENOSCOPY (EGD) WITH PROPOFOL;  Surgeon: Toledo, Benay Pike, MD;  Location: [facility] ENDOSCOPY;  Service: Gastroenterology;  Laterality: N/A;  . HERNIA REPAIR    . JOINT REPLACEMENT    . knee replacement right    . PEG PLACEMENT N/A 04/24/2019   Procedure: PERCUTANEOUS ENDOSCOPIC GASTROSTOMY (PEG) PLACEMENT;  Surgeon: Toledo, Benay Pike, MD;  Location: [facility] ENDOSCOPY;  Service: Gastroenterology;  Laterality: N/A;  . right hip replacement    . rotator cuff surgery    . SPINE SURGERY       Prior to Admission medications   Medication Sig Start Date End Date Taking? Authorizing Provider  sulfamethoxazole-trimethoprim (BACTRIM DS) 800-160 MG tablet Take 1 tablet by mouth 2 (two) times daily. 07/23/20  Yes Carrie Mew, MD  acetaminophen (TYLENOL) 325 MG tablet Take 2 tablets (650 mg total) by mouth every 6 (six) hours as needed for mild pain (or Fever >/= 101). Patient not taking: Reported on 05/26/2020 02/24/18   Nicholes Mango, MD  ascorbic acid (VITAMIN C) 500  MG tablet Take 1 tablet (500 mg total) by mouth daily. Patient not taking: Reported on 05/26/2020 07/10/19   Loletha Grayer, MD  aspirin 81 MG chewable tablet Chew 81 mg by mouth daily.    [provider]  cholecalciferol (VITAMIN D) 25 MCG tablet Take 1 tablet (1,000 Units total) by mouth daily. 07/10/19   Loletha Grayer, MD  Etanercept (ENBREL [place]) Inject into the skin. Patient not taking: Reported on 05/26/2020    [provider]  feeding supplement, ENSURE ENLIVE, (ENSURE ENLIVE) LIQD Take 237 mLs by mouth 3 (three) times daily between meals. Patient not taking: Reported on 05/26/2020 07/10/19   Loletha Grayer, MD  ferrous sulfate 325 (65 FE) MG tablet Take by mouth.    [provider]  folic acid (FOLVITE) 1 MG tablet Take 1 tablet (1 mg total) by mouth daily. 02/27/20   Lequita Asal, MD  furosemide (LASIX) 40 MG tablet Take 40 mg by mouth daily.  01/17/19   [provider]  gabapentin (NEURONTIN) 300 MG capsule TAKE 1 CAPSULE (300 MG TOTAL) BY MOUTH 4 (FOUR) TIMES DAILY. QHS 09/21/19   [provider]  hydroxychloroquine (PLAQUENIL) 200 MG tablet Take 200 mg by mouth daily. 06/16/19   [provider]  lamoTRIgine (  LAMICTAL) 200 MG tablet Take 200 mg by mouth 2 (two) times daily.    [provider]  lamoTRIgine (LAMICTAL) 25 MG tablet Take 25 mg by mouth 2 (two) times daily. 04/21/18   [provider]  leflunomide (ARAVA) 10 MG tablet  12/10/19   [provider]  magnesium oxide (MAG-OX) 400 MG tablet Take 400 mg by mouth daily.    [provider]  metoprolol tartrate (LOPRESSOR) 25 MG tablet Take 1 tablet (25 mg total) by mouth 2 (two) times daily. 07/10/19 07/09/20  Loletha Grayer, MD  nystatin (MYCOSTATIN) 100000 UNIT/ML suspension Take 5 mLs (500,000 Units total) by mouth 4 (four) times daily. Patient not taking: Reported on 05/26/2020 07/10/19   Loletha Grayer, MD  omeprazole (PRILOSEC) 40 MG capsule  Take 40 mg by mouth daily as needed.     [provider]  polyethylene glycol (MIRALAX / GLYCOLAX) 17 g packet Take 17 g by mouth daily as needed. 07/10/19   Loletha Grayer, MD  predniSONE (DELTASONE) 5 MG tablet 4 tabs po day1, 3 tabs po day2; 2 tabs po day3,4; 1 tab po day5,6; 1/2 tab po day7,8,9,10 07/10/19   Loletha Grayer, MD  thiamine 100 MG tablet Take 1 tablet (100 mg total) by mouth daily. 04/27/19   [name], MD  traZODone (DESYREL) 50 MG tablet Take 50 mg by mouth at bedtime.    [provider]  zinc sulfate 220 (50 Zn) MG capsule Take 1 capsule (220 mg total) by mouth daily. Patient not taking: Reported on 05/26/2020 07/10/19   Loletha Grayer, MD     Allergies Cefoperazone, Penicillins, and Latex   Family History  Problem Relation Age of Onset  . Hypertension Other   . Hypertension Mother   . Dementia Mother   . Dementia Father   . Prostate cancer Neg Hx   . Bladder Cancer Neg Hx   . Kidney cancer Neg Hx     Social History Social History   Tobacco Use  . Smoking status: Former Smoker    Packs/day: 1.00    Years: 20.00    Pack years: 20.00    Types: Cigarettes  . Smokeless tobacco: Never Used  Vaping Use  . Vaping Use: Never used  Substance Use Topics  . Alcohol use: No  . Drug use: Never    Review of Systems   ENT:   No sore throat. No rhinorrhea. Cardiovascular:   No chest pain or syncope. Respiratory:   No dyspnea or cough. Gastrointestinal:   Negative for abdominal pain, vomiting and diarrhea.  Musculoskeletal:   Negative for focal pain.  There is chronic bilateral lower extremity edema which is unchanged from baseline. All other systems reviewed and are negative except as documented above in ROS and HPI.  ____________________________________________   PHYSICAL EXAM:  VITAL SIGNS: ED Triage Vitals  Enc Vitals Group     BP 07/23/20 1646 126/78     Pulse Rate 07/23/20 1646 98     Resp 07/23/20 1646 18     Temp 07/23/20  1646 99.6 F (37.6 C)     Temp Source 07/23/20 1646 Oral     SpO2 07/23/20 1646 95 %     Weight 07/23/20 1640 235 lb (106.6 kg)     Height 07/23/20 1640 6\' 1"  (1.854 m)     Head Circumference --      Peak Flow --      Pain Score 07/23/20 1640 4     Pain Loc --  Pain Edu? --      Excl. in [place]? --     Vital signs reviewed, nursing assessments reviewed.   Constitutional:   Alert and oriented. Non-toxic appearance. Eyes:   Conjunctivae are normal. EOMI. PERRL. ENT      Head:   Normocephalic and atraumatic.      Nose: Normal      Mouth/Throat: Normal.      Neck:   No meningismus. Full ROM. Hematological/Lymphatic/Immunilogical:   No cervical lymphadenopathy. Cardiovascular:   RRR. Symmetric bilateral radial and DP pulses.  No murmurs. Cap refill less than 2 seconds. Respiratory:   Normal respiratory effort without tachypnea/retractions. Breath sounds are clear and equal bilaterally. No wheezes/rales/rhonchi. Gastrointestinal:   Soft and nontender. Non distended. There is no CVA tenderness.  No rebound, rigidity, or guarding.  Musculoskeletal:   Normal range of motion in all extremities. No joint effusions.  No lower extremity tenderness.  1+ edema bilateral lower extremities Neurologic:   Normal speech and language.  Motor grossly intact. No acute focal neurologic deficits are appreciated.  Skin:    Skin is warm, dry and intact. No rash noted.  No petechiae, purpura, or bullae.  ____________________________________________    LABS (pertinent positives/negatives) (all labs ordered are listed, but only abnormal results are displayed) Labs Reviewed  BASIC METABOLIC PANEL - Abnormal; Notable for the following components:      Result Value   Calcium 8.8 (*)    All other components within normal limits  CBC WITH DIFFERENTIAL/PLATELET - Abnormal; Notable for the following components:   Lymphs Abs 0.3 (*)    All other components within normal limits  URINALYSIS, COMPLETE  (UACMP) WITH MICROSCOPIC - Abnormal; Notable for the following components:   Color, Urine AMBER (*)    APPearance CLOUDY (*)    Specific Gravity, Urine 1.032 (*)    Ketones, ur 5 (*)    Protein, ur 30 (*)    Leukocytes,Ua LARGE (*)    WBC, UA >50 (*)    All other components within normal limits  URINE CULTURE   ____________________________________________   EKG  Interpreted by me Sinus tachycardia rate 101.  Left axis, left bundle branch block.  No acute ischemic changes.  ____________________________________________    [id number]  DG Chest Portable 1 View  Result Date: 07/23/2020 CLINICAL DATA:  Fatigue. EXAM: PORTABLE CHEST 1 VIEW COMPARISON:  Radiograph 08/22/2019 FINDINGS: Persistent low lung volumes. Normal heart size and mediastinal contours. Subsegmental atelectasis at the left lung base. No confluent consolidation. No pulmonary edema, pleural effusion, or pneumothorax. Colonic interposition under the right hemidiaphragm. Prior right rotator cuff repair. IMPRESSION: Low lung volumes with left basilar atelectasis. Electronically Signed   By: Keith Rake M.D.   On: 07/23/2020 18:50    ____________________________________________   PROCEDURES Procedures  ____________________________________________  DIFFERENTIAL DIAGNOSIS   Vaccine reaction, UTI, pneumonia, dehydration, electrolyte abnormality, anemia  CLINICAL IMPRESSION / ASSESSMENT AND PLAN / ED COURSE  Medications ordered in the ED: Medications  sulfamethoxazole-trimethoprim (BACTRIM DS) 800-160 MG per tablet 1 tablet (has no administration in time range)    Pertinent labs & imaging results that were available during my care of the patient were reviewed by me and considered in my medical decision making (see chart for details).  Jeremiah Royals. was evaluated in Emergency Department on 07/23/2020 for the symptoms described in the history of present illness. He was evaluated in the context of the global  COVID-19 pandemic, which necessitated consideration that the patient might be  at risk for infection with the SARS-CoV-2 virus that causes COVID-19. Institutional protocols and algorithms that pertain to the evaluation of patients at risk for COVID-19 are in a state of rapid change based on information released by regulatory bodies including the CDC and federal and state organizations. These policies and algorithms were followed during the patient's care in the ED.   Patient presents with fatigue, no focal symptoms, exam benign and reassuring.  Vital signs normal here.  Screening work-up undertaken due to age and comorbidities, labs are normal, chest x-ray unremarkable.  Urinalysis shows pyuria, and in this context I think it is worth treating with antibiotics.  Will start the patient on Bactrim and send a urine culture.      ____________________________________________   FINAL CLINICAL IMPRESSION(S) / ED DIAGNOSES    Final diagnoses:  Fatigue, unspecified type  Cystitis  Type 2 diabetes mellitus without complication, unspecified whether long term insulin use [facility])     ED Discharge Orders         Ordered    sulfamethoxazole-trimethoprim (BACTRIM DS) 800-160 MG tablet  2 times daily        07/23/20 1913          Portions of this note were generated with dragon dictation software. Dictation errors may occur despite best attempts at proofreading.   Carrie Mew, MD 07/23/20 [phone]

## 2020-07-23 NOTE — ED Triage Notes (Signed)
Altered per family. "Slower than usual." Pt AOx4. Covid booster yesterday

## 2020-07-23 NOTE — Discharge Instructions (Signed)
Your checks x-ray and blood tests were normal today.  Your urine test shows some signs of inflammation, and we have prescribed you Bactrim to take for the next week.  Please follow-up with your doctor in a week for urine recheck and for monitoring of your symptoms.  You can go sooner if you are not feeling better, and return to the emergency room if your symptoms are worsening.

## 2020-07-26 LAB — URINE CULTURE

## 2020-11-24 ENCOUNTER — Other Ambulatory Visit: Payer: Self-pay

## 2020-11-24 ENCOUNTER — Inpatient Hospital Stay: Payer: Medicare PPO | Attending: Oncology

## 2020-11-24 DIAGNOSIS — D509 Iron deficiency anemia, unspecified: Secondary | ICD-10-CM

## 2020-11-24 LAB — CBC WITH DIFFERENTIAL/PLATELET
Abs Immature Granulocytes: 0.01 10*3/uL (ref 0.00–0.07)
Basophils Absolute: 0.1 10*3/uL (ref 0.0–0.1)
Basophils Relative: 1 %
Eosinophils Absolute: 0.2 10*3/uL (ref 0.0–0.5)
Eosinophils Relative: 3 %
HCT: 41.8 % (ref 39.0–52.0)
Hemoglobin: 13.5 g/dL (ref 13.0–17.0)
Immature Granulocytes: 0 %
Lymphocytes Relative: 29 %
Lymphs Abs: 1.8 10*3/uL (ref 0.7–4.0)
MCH: 27.2 pg (ref 26.0–34.0)
MCHC: 32.3 g/dL (ref 30.0–36.0)
MCV: 84.1 fL (ref 80.0–100.0)
Monocytes Absolute: 0.5 10*3/uL (ref 0.1–1.0)
Monocytes Relative: 8 %
Neutro Abs: 3.6 10*3/uL (ref 1.7–7.7)
Neutrophils Relative %: 59 %
Platelets: 217 10*3/uL (ref 150–400)
RBC: 4.97 MIL/uL (ref 4.22–5.81)
RDW: 15.5 % (ref 11.5–15.5)
WBC: 6.2 10*3/uL (ref 4.0–10.5)
nRBC: 0 % (ref 0.0–0.2)

## 2020-11-24 LAB — FERRITIN: Ferritin: 76 ng/mL (ref 24–336)

## 2021-03-23 ENCOUNTER — Telehealth: Payer: Self-pay | Admitting: Oncology

## 2021-03-23 NOTE — Telephone Encounter (Signed)
Fonnie Birkenhead, looks like he was r/s to [facility] due to the upcoming changes, will you give wife call back please. Thanks

## 2021-03-23 NOTE — Telephone Encounter (Signed)
Wife called and said someone had called and talked to pt but he couldn't remember what they said. Please given her a call back at [phone]

## 2021-05-04 ENCOUNTER — Emergency Department: Payer: Medicare PPO

## 2021-05-04 ENCOUNTER — Emergency Department
Admission: EM | Admit: 2021-05-04 | Discharge: 2021-05-04 | Disposition: A | Discharge status: Home / Self Care | Payer: Medicare PPO | Attending: Emergency Medicine | Admitting: Emergency Medicine

## 2021-05-04 DIAGNOSIS — Z79899 Other long term (current) drug therapy: Secondary | ICD-10-CM | POA: Diagnosis not present

## 2021-05-04 DIAGNOSIS — Z91040 Latex allergy status: Secondary | ICD-10-CM | POA: Insufficient documentation

## 2021-05-04 DIAGNOSIS — Z20822 Contact with and (suspected) exposure to covid-19: Secondary | ICD-10-CM | POA: Diagnosis not present

## 2021-05-04 DIAGNOSIS — R4182 Altered mental status, unspecified: Secondary | ICD-10-CM | POA: Diagnosis not present

## 2021-05-04 DIAGNOSIS — Z96651 Presence of right artificial knee joint: Secondary | ICD-10-CM | POA: Insufficient documentation

## 2021-05-04 DIAGNOSIS — Z87891 Personal history of nicotine dependence: Secondary | ICD-10-CM | POA: Insufficient documentation

## 2021-05-04 DIAGNOSIS — Z96641 Presence of right artificial hip joint: Secondary | ICD-10-CM | POA: Insufficient documentation

## 2021-05-04 DIAGNOSIS — N390 Urinary tract infection, site not specified: Secondary | ICD-10-CM

## 2021-05-04 DIAGNOSIS — Z7982 Long term (current) use of aspirin: Secondary | ICD-10-CM | POA: Diagnosis not present

## 2021-05-04 DIAGNOSIS — E119 Type 2 diabetes mellitus without complications: Secondary | ICD-10-CM | POA: Insufficient documentation

## 2021-05-04 DIAGNOSIS — R42 Dizziness and giddiness: Secondary | ICD-10-CM

## 2021-05-04 DIAGNOSIS — I110 Hypertensive heart disease with heart failure: Secondary | ICD-10-CM | POA: Insufficient documentation

## 2021-05-04 DIAGNOSIS — I509 Heart failure, unspecified: Secondary | ICD-10-CM | POA: Diagnosis not present

## 2021-05-04 DIAGNOSIS — Z8616 Personal history of COVID-19: Secondary | ICD-10-CM | POA: Diagnosis not present

## 2021-05-04 LAB — COMPREHENSIVE METABOLIC PANEL
ALT: 13 U/L (ref 0–44)
AST: 17 U/L (ref 15–41)
Albumin: 3.6 g/dL (ref 3.5–5.0)
Alkaline Phosphatase: 100 U/L (ref 38–126)
Anion gap: 8 (ref 5–15)
BUN: 20 mg/dL (ref 8–23)
CO2: 30 mmol/L (ref 22–32)
Calcium: 9.2 mg/dL (ref 8.9–10.3)
Chloride: 101 mmol/L (ref 98–111)
Creatinine, Ser: 1.38 mg/dL — ABNORMAL HIGH (ref 0.61–1.24)
GFR, Estimated: 52 mL/min — ABNORMAL LOW (ref >60)
Glucose, Bld: 97 mg/dL (ref 70–99)
Potassium: 3.9 mmol/L (ref 3.5–5.1)
Sodium: 139 mmol/L (ref 135–145)
Total Bilirubin: 0.6 mg/dL (ref 0.3–1.2)
Total Protein: 6.9 g/dL (ref 6.5–8.1)

## 2021-05-04 LAB — URINALYSIS, COMPLETE (UACMP) WITH MICROSCOPIC
Bilirubin Urine: NEGATIVE
Glucose, UA: NEGATIVE mg/dL
Nitrite: NEGATIVE
Protein, ur: 30 mg/dL — AB
Specific Gravity, Urine: 1.015 (ref 1.005–1.030)
Squamous Epithelial / HPF: NONE SEEN (ref 0–5)
WBC, UA: >50 WBC/hpf (ref 0–5)
pH: 5.5 (ref 5.0–8.0)

## 2021-05-04 LAB — CBC WITH DIFFERENTIAL/PLATELET
Abs Immature Granulocytes: 0.06 10*3/uL (ref 0.00–0.07)
Basophils Absolute: 0.1 10*3/uL (ref 0.0–0.1)
Basophils Relative: 1 %
Eosinophils Absolute: 0.1 10*3/uL (ref 0.0–0.5)
Eosinophils Relative: 1 %
HCT: 43.3 % (ref 39.0–52.0)
Hemoglobin: 13.6 g/dL (ref 13.0–17.0)
Immature Granulocytes: 1 %
Lymphocytes Relative: 6 %
Lymphs Abs: 0.6 10*3/uL — ABNORMAL LOW (ref 0.7–4.0)
MCH: 27.3 pg (ref 26.0–34.0)
MCHC: 31.4 g/dL (ref 30.0–36.0)
MCV: 86.8 fL (ref 80.0–100.0)
Monocytes Absolute: 0.4 10*3/uL (ref 0.1–1.0)
Monocytes Relative: 4 %
Neutro Abs: 9.1 10*3/uL — ABNORMAL HIGH (ref 1.7–7.7)
Neutrophils Relative %: 87 %
Platelets: 173 10*3/uL (ref 150–400)
RBC: 4.99 MIL/uL (ref 4.22–5.81)
RDW: 15.9 % — ABNORMAL HIGH (ref 11.5–15.5)
WBC: 10.3 10*3/uL (ref 4.0–10.5)
nRBC: 0 % (ref 0.0–0.2)

## 2021-05-04 LAB — PROTIME-INR
INR: 1 (ref 0.8–1.2)
Prothrombin Time: 13.3 seconds (ref 11.4–15.2)

## 2021-05-04 LAB — RESP PANEL BY RT-PCR (FLU A&B, COVID) ARPGX2
Influenza A by PCR: NEGATIVE
Influenza B by PCR: NEGATIVE
SARS Coronavirus 2 by RT PCR: NEGATIVE

## 2021-05-04 LAB — TROPONIN I (HIGH SENSITIVITY)
Troponin I (High Sensitivity): 14 ng/L (ref <18)
Troponin I (High Sensitivity): 15 ng/L (ref <18)

## 2021-05-04 LAB — BRAIN NATRIURETIC PEPTIDE: B Natriuretic Peptide: 60.4 pg/mL (ref 0.0–100.0)

## 2021-05-04 LAB — LACTIC ACID, PLASMA: Lactic Acid, Venous: 1.6 mmol/L (ref 0.5–1.9)

## 2021-05-04 LAB — LIPASE, BLOOD: Lipase: 31 U/L (ref 11–51)

## 2021-05-04 LAB — APTT: aPTT: 27 seconds (ref 24–36)

## 2021-05-04 MED ORDER — IOHEXOL 350 MG/ML SOLN
80.0000 mL | Freq: Once | INTRAVENOUS | Status: AC | PRN
  Administered 2021-05-04: 80 mL via INTRAVENOUS

## 2021-05-04 MED ORDER — LACTATED RINGERS IV BOLUS (SEPSIS)
1000.0000 mL | Freq: Once | INTRAVENOUS | Status: AC
  Administered 2021-05-04: 1000 mL via INTRAVENOUS

## 2021-05-04 MED ORDER — LEVOFLOXACIN IN D5W 500 MG/100ML IV SOLN
500.0000 mg | Freq: Once | INTRAVENOUS | Status: AC
  Administered 2021-05-04: 500 mg via INTRAVENOUS
  Filled 2021-05-04: qty 100

## 2021-05-04 MED ORDER — LEVOFLOXACIN 500 MG PO TABS: 500.0000 mg | ORAL_TABLET | Freq: Every day | ORAL | 0 refills | Status: AC

## 2021-05-04 NOTE — ED Provider Notes (Signed)
Emergency Medicine Provider Triage Evaluation Note  Jeremiah [name] , a 80 y.o. male  was evaluated in triage.  Pt complains of altered mental status.  Family called EMS because the patient has not been acting himself recently.  EMS reports that he was quite confused upon their arrival and had oxygen saturation of 88% on room air with no baseline oxygen requirement.  Heart rate in the 1 teens.  His mental status improved after getting oxygen by nasal cannula in route to the hospital.  Review of Systems  Positive: Cough, general malaise, dizziness Negative: Chest pain, shortness of breath, fever  Physical Exam  There were no vitals taken for this visit. Gen:   Awake, no distress at this moment Resp:  Normal effort, no wheezing MSK:   Moves extremities without difficulty  Other:  No abdominal distention  Medical Decision Making  Medically screening exam initiated at 6:10 AM.  Appropriate orders placed.  Jeremiah [name]. was informed that the remainder of the evaluation will be completed by another provider, this initial triage assessment does not replace that evaluation, and the importance of remaining in the ED until their evaluation is complete.  Given the patient's tachycardia and hypoxemia as well as altered mental status, I am initiating a "possible sepsis" work-up including labs and chest x-ray.   Hinda Kehr, MD 05/04/21 [phone]

## 2021-05-04 NOTE — ED Triage Notes (Signed)
Pt brought in per EMS from home for reports of initially altered mentation noted by family around [phone]. Pt initially noted w/ SPO2 88% by EMS and started on O2 per nasal cannula. Pt c/o of dizziness, denies symptoms on arrival. Pt presents to ED AAOx2, respi even-unlabored.

## 2021-05-04 NOTE — ED Notes (Signed)
Pt sitting in bed at this time. Resp equal and non labored. No concerns or needs voiced. Pt has male external cath on, no urine at this time. No kinks in the line suction is good. Pt states that he is going to try to pee. Will continue to monitor. Family at bedside.

## 2021-05-04 NOTE — ED Provider Notes (Signed)
[facility] Emergency Department Provider Note  Time seen: 7:54 AM  I have reviewed the triage vital signs and the nursing notes.   HISTORY  Chief Complaint Dizziness   HPI Jeremiah [name]. is a 80 y.o. male with a past medical history of anemia, arthritis, CHF, diabetes, gastric reflux, hypertension, seizure disorder, presents to the emergency department for altered mental status.  According to the patient's wife she noted overnight last night that the patient was acting somewhat abnormal.  States he was moaning and talking in his sleep.  She woke him up and he told her that his knees hurt so she gave him ibuprofen overnight, not clear what time this was.  States when they woke up this morning he seemed to be confused and more sluggish with slow responses than normal.  Wife states this is happened previously when the patient has had an infection such as a urinary tract infection which is her main concern.  Here the patient is awake alert able to answer questions appropriately.  Wife states he is much improved and nearly back to baseline.  Patient noted to be 88% on room air, wife and patient deny an underlying lung issues, no home O2 requirement.  Slight cough starting last night.   Past Medical History:  Diagnosis Date   Allergy    Anemia    Anxiety    Arthritis    CHF (congestive heart failure) (HCC)    Collagen vascular disease ([place])    Depression    Diabetes mellitus without complication ([place])    GERD (gastroesophageal reflux disease)    Hypertension    Rheumatoid arthritis ([place])    Seizures ([place])    Sepsis ([place]) 05/23/2015    Patient Active Problem List   Diagnosis Date Noted   Weight loss 12/24/2019   Iron deficiency anemia 12/24/2019   Monoclonal B-cell lymphocytosis    Left wrist pain    MDS (myelodysplastic syndrome) (HCC)    Fever    Calf tenderness    Encephalopathy acute    Generalized weakness    Hypokalemia    COVID-19 virus infection  07/02/2019   Dysphagia    Oral mucositis 04/14/2019   Neutropenia with fever ([place]) 70/48/8891   Acute metabolic encephalopathy 69/45/0388   Pancytopenia ([place])    Cellulitis of lower extremity 04/08/2019   Folate deficiency    AKI (acute kidney injury) ([place])    Pressure injury of skin 04/05/2019   Other pancytopenia ([place])    Cellulitis 03/31/2019   Edema of both legs 08/19/2018   History of allergy to latex 07/22/2018   Lymphedema 06/16/2018   Chronic venous insufficiency 06/16/2018   Venous ulcer of leg ([place]) 06/16/2018   PAD (peripheral artery disease) ([place]) 06/16/2018   Diabetes mellitus type 2, uncomplicated ([place]) 82/80/0349   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   Obesity (BMI 30.0-34.9) 02/27/2018   Primary osteoarthritis of left knee 02/27/2018   Urinary retention 02/25/2018   Rheumatoid arthritis involving multiple sites ([place]) 02/25/2018   Left knee pain 02/20/2018   Neurogenic pain 10/02/2017   Spondylosis without myelopathy or radiculopathy, lumbosacral region 07/17/2017   Trigger point with back pain (recurrent/intermittent) 07/17/2017   Chronic musculoskeletal pain 02/27/2017   Myofascial pain syndrome 02/27/2017   Long term current use of anticoagulant ([place]) 02/27/2017   Chronic lumbar radiculitis (L5 dermatome) (Right) 02/27/2017   Chronic lumbar radiculitis (L3 dermatome) (Left) 02/27/2017   Chronic foot pain (Right) 02/27/2017   Chronic thoracic  back pain (Bilateral) (R>L) 02/27/2017   Chronic neck pain (posterior) (Bilateral) (R>L) 02/27/2017   Chronic shoulder pain (Bilateral) (L>R) 02/27/2017   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   Disorder of skeletal system 02/19/2017   Pharmacologic therapy 02/19/2017   Problems influencing health status 02/19/2017   Chronic pain syndrome 01/14/2017   Gastroesophageal reflux disease 01/14/2017   Hyperlipidemia 01/14/2017   Lumbar radiculopathy 01/14/2017   Postoperative anemia due to acute blood loss 01/14/2017   Postprocedural hypotension 01/14/2017   Chronic lower extremity pain (Secondary Area of Pain) (Bilateral) (R>L) 01/14/2017   Rheumatoid arthritis ([place]) 12/17/2013   Diabetes ([place]) 12/17/2013   Benign essential hypertension 11/13/2013   Chronic low back pain (Primary Area of Pain) (Bilateral) (R>L) 11/13/2013   Prostatitis 11/13/2013   Seizure ([place]) 11/13/2013   Left bundle branch block 05/21/2013   Dorsalgia 05/21/2013    Past Surgical History:  Procedure Laterality Date   BACK SURGERY     back surgery five times  04/2016   CARPAL TUNNEL RELEASE     COLONOSCOPY WITH PROPOFOL N/A 04/27/2020   Procedure: COLONOSCOPY WITH PROPOFOL;  Surgeon: Toledo, Benay Pike, MD;  Location: [facility] ENDOSCOPY;  Service: Gastroenterology;  Laterality: N/A;   ESOPHAGOGASTRODUODENOSCOPY (EGD) WITH PROPOFOL N/A 04/27/2020   Procedure: ESOPHAGOGASTRODUODENOSCOPY (EGD) WITH PROPOFOL;  Surgeon: Toledo, Benay Pike, MD;  Location: [facility] ENDOSCOPY;  Service: Gastroenterology;  Laterality: N/A;   HERNIA REPAIR     JOINT REPLACEMENT     knee replacement right     PEG PLACEMENT N/A 04/24/2019   Procedure: PERCUTANEOUS ENDOSCOPIC GASTROSTOMY (PEG) PLACEMENT;  Surgeon: Toledo, Benay Pike, MD;  Location: [facility] ENDOSCOPY;  Service: Gastroenterology;  Laterality: N/A;   right hip replacement     rotator cuff surgery     SPINE SURGERY      Prior to Admission  medications   Medication Sig Start Date End Date Taking? Authorizing Provider  acetaminophen (TYLENOL) 325 MG tablet Take 2 tablets (650 mg total) by mouth every 6 (six) hours as needed for mild pain (or Fever >/= 101). Patient not taking: Reported on 05/26/2020 02/24/18   Nicholes Mango, MD  ascorbic acid (VITAMIN C) 500 MG tablet Take 1 tablet (500 mg total) by mouth daily. Patient not taking: Reported on 05/26/2020 07/10/19   Loletha Grayer, MD  aspirin 81 MG chewable tablet Chew 81 mg by mouth daily.    [provider]  cholecalciferol (VITAMIN D) 25 MCG tablet Take 1 tablet (1,000 Units total) by mouth daily. 07/10/19   Loletha Grayer, MD  Etanercept (ENBREL [place]) Inject into the skin. Patient not taking: Reported on 05/26/2020    [provider]  feeding supplement, ENSURE ENLIVE, (ENSURE ENLIVE) LIQD Take 237 mLs by mouth 3 (three) times daily between meals. Patient not taking: Reported on 05/26/2020 07/10/19   Loletha Grayer, MD  ferrous sulfate 325 (65 FE) MG tablet Take by mouth.    [provider]  folic acid (FOLVITE) 1 MG tablet  Take 1 tablet (1 mg total) by mouth daily. 02/27/20   Lequita Asal, MD  furosemide (LASIX) 40 MG tablet Take 40 mg by mouth daily.  01/17/19   [provider]  gabapentin (NEURONTIN) 300 MG capsule TAKE 1 CAPSULE (300 MG TOTAL) BY MOUTH 4 (FOUR) TIMES DAILY. QHS 09/21/19   [provider]  hydroxychloroquine (PLAQUENIL) 200 MG tablet Take 200 mg by mouth daily. 06/16/19   [provider]  lamoTRIgine (LAMICTAL) 200 MG tablet Take 200 mg by mouth 2 (two) times daily.    [provider]  lamoTRIgine (LAMICTAL) 25 MG tablet Take 25 mg by mouth 2 (two) times daily. 04/21/18   [provider]  leflunomide (ARAVA) 10 MG tablet  12/10/19   [provider]  magnesium oxide (MAG-OX) 400 MG tablet Take 400 mg by mouth daily.    [provider]  metoprolol tartrate (LOPRESSOR) 25 MG tablet  Take 1 tablet (25 mg total) by mouth 2 (two) times daily. 07/10/19 07/09/20  Loletha Grayer, MD  nystatin (MYCOSTATIN) 100000 UNIT/ML suspension Take 5 mLs (500,000 Units total) by mouth 4 (four) times daily. Patient not taking: Reported on 05/26/2020 07/10/19   Loletha Grayer, MD  omeprazole (PRILOSEC) 40 MG capsule Take 40 mg by mouth daily as needed.     [provider]  polyethylene glycol (MIRALAX / GLYCOLAX) 17 g packet Take 17 g by mouth daily as needed. 07/10/19   Loletha Grayer, MD  predniSONE (DELTASONE) 5 MG tablet 4 tabs po day1, 3 tabs po day2; 2 tabs po day3,4; 1 tab po day5,6; 1/2 tab po day7,8,9,10 07/10/19   Loletha Grayer, MD  sulfamethoxazole-trimethoprim (BACTRIM DS) 800-160 MG tablet Take 1 tablet by mouth 2 (two) times daily. 07/23/20   Carrie Mew, MD  thiamine 100 MG tablet Take 1 tablet (100 mg total) by mouth daily. 04/27/19   [name], MD  traZODone (DESYREL) 50 MG tablet Take 50 mg by mouth at bedtime.    [provider]  zinc sulfate 220 (50 Zn) MG capsule Take 1 capsule (220 mg total) by mouth daily. Patient not taking: Reported on 05/26/2020 07/10/19   Loletha Grayer, MD    Allergies  Allergen Reactions   Cefoperazone Anaphylaxis   Penicillins Anaphylaxis and Other (See Comments)    Has patient had a PCN reaction causing immediate rash, facial/tongue/throat swelling, SOB or lightheadedness with hypotension: Yes Has patient had a PCN reaction causing severe rash involving mucus membranes or skin necrosis: No Has patient had a PCN reaction that required hospitalization No Has patient had a PCN reaction occurring within the last 10 years: No If all of the above answers are "NO", then may proceed with Cephalosporin use.   Latex Rash    Family History  Problem Relation Age of Onset   Hypertension Other    Hypertension Mother    Dementia Mother    Dementia Father    Prostate cancer Neg Hx    Bladder Cancer Neg Hx    Kidney cancer Neg  Hx     Social History Social History   Tobacco Use   Smoking status: Former    Packs/day: 1.00    Years: 20.00    Pack years: 20.00    Types: Cigarettes   Smokeless tobacco: Never  Vaping Use   Vaping Use: Never used  Substance Use Topics   Alcohol use: No   Drug use: Never    Review of Systems Constitutional: Negative for fever.  Confusion  today. Cardiovascular: Negative for chest pain. Respiratory: Negative for shortness of breath.  Slight cough since last night. Gastrointestinal: Negative for abdominal pain, vomiting and diarrhea. Genitourinary: Negative for urinary compaints Musculoskeletal: Negative for musculoskeletal complaints Neurological: Negative for headache All other ROS negative  ____________________________________________   PHYSICAL EXAM:  VITAL SIGNS: ED Triage Vitals  Enc Vitals Group     BP 05/04/21 0626 101/62     Pulse Rate 05/04/21 0626 (!) 110     Resp 05/04/21 0626 16     Temp 05/04/21 0626 98.4 F (36.9 C)     Temp Source 05/04/21 0626 Oral     SpO2 --      Weight 05/04/21 0627 245 lb (111.1 kg)     Height 05/04/21 0627 6' (1.829 m)     Head Circumference --      Peak Flow --      Pain Score --      Pain Loc --      Pain Edu? --      Excl. in [place]? --     Constitutional: Awake alert.  Well appearing and in no distress.  Answering questions appropriately. Eyes: Normal exam ENT      Head: Normocephalic and atraumatic.      Mouth/Throat: Mucous membranes are moist. Cardiovascular: Normal rate, regular rhythm.  Respiratory: Normal respiratory effort without tachypnea nor retractions. Breath sounds are clear Gastrointestinal: Soft and nontender. No distention.   Musculoskeletal: 3+ pitting edema to bilateral lower extremities, chronic per wife. Neurologic:  Normal speech and language. No gross focal neurologic deficits, moves all extremities. Skin:  Skin is warm, dry and intact.  Psychiatric: Mood and affect are normal.    ____________________________________________    EKG  EKG viewed and interpreted by myself shows a sinus tachycardia 110 bpm with a widened QRS, normal axis, largely normal intervals, nonspecific ST changes consistent with left bundle branch block.   I reviewed the patient's prior EKG from 08/22/2019 patient also has a left bundle branch block at that time.  ____________________________________________    RADIOLOGY  Chest x-ray shows low lung volumes with venous congestion. CTA negative  ____________________________________________   INITIAL IMPRESSION / ASSESSMENT AND PLAN / ED COURSE  Pertinent labs & imaging results that were available during my care of the patient were reviewed by me and considered in my medical decision making (see chart for details).   Patient presents emergency department for altered mental status/confusion beginning overnight.  Wife states the patient was shaking with chills overnight but she gave ibuprofen and never checked a temperature.  States he has had similar confusion and slowed speech when he has had prior infections such as urinary tract infections.  Wife states the patient is improving and is nearly back to baseline.  Patient's oxygen level is found to be somewhat low 88% patient placed on 2 L and is satting upper 90s to 100%.  We will obtain a COVID/flu swab obtain a chest x-ray and continue to closely monitor.  We will check labs.  Lab work is largely nonrevealing, beside slight renal insufficiency which could have been contributing to the patient's initial low blood pressure.  Patient states he is feeling much better.  He is awake alert oriented.  Wife states he is acting normal.  However given the patient's initial lower O2 saturation and blood pressure I did recommend admission to the hospital.  Urinalysis is concerning for possible UTI.  Patient is adamant about going home.  Wife states they will  return if the patient worsens.  We will discharge  with antibiotics for possible urinary tract infection I sent a urine culture.  Discussed increased oral hydration/intake.  They agreeable to plan of care and will return if patient is to worsen.  [name]. was evaluated in Emergency Department on 05/04/2021 for the symptoms described in the history of present illness. He was evaluated in the context of the global COVID-19 pandemic, which necessitated consideration that the patient might be at risk for infection with the SARS-CoV-2 virus that causes COVID-19. Institutional protocols and algorithms that pertain to the evaluation of patients at risk for COVID-19 are in a state of rapid change based on information released by regulatory bodies including the CDC and federal and state organizations. These policies and algorithms were followed during the patient's care in the ED.  ____________________________________________   FINAL CLINICAL IMPRESSION(S) / ED DIAGNOSES  Altered mental status   Harvest Dark, MD 05/04/21 1507

## 2021-05-04 NOTE — ED Notes (Signed)
Pt discharge information reviewed. Pt understands importance for need of follow up care, and when to return if symptoms worsen.  Pt understands all information. All questions answered. Pt is alert and oriented with even and regular respirations. No acute distress noted. Pt brought out of department in wheelchair.

## 2021-05-04 NOTE — Discharge Instructions (Addendum)
Pleasing plenty of fluids and obtain plenty of rest.  Please begin taking your antibiotics tomorrow 05/05/2021 and finish them as prescribed.  Return to the emergency department for any further episodes of weakness confusion or any other symptom personally concerning to yourself.  Otherwise please follow-up with your primary care doctor within the next 48 hours for recheck/reevaluation.

## 2021-05-06 LAB — URINE CULTURE: Culture: 80000 — AB

## 2021-05-09 LAB — CULTURE, BLOOD (ROUTINE X 2)
Culture: NO GROWTH
Culture: NO GROWTH

## 2021-05-13 IMAGING — CT CT HEAD W/O CM
3 series · 16 of 47 positions shown, 19 images · non-contrast
Comparison: April 11, 2019

CLINICAL DATA: Mental status change.

EXAM:
CT HEAD WITHOUT CONTRAST
TECHNIQUE: Contiguous axial images were obtained from the base of the skull
through the vertex without intravenous contrast.

[Series 3: head wo · axial · 0.43mm/px · z∈[+303,+438]mm · 10 of 33 slices shown, 13 images]
[im 3/33  brain]
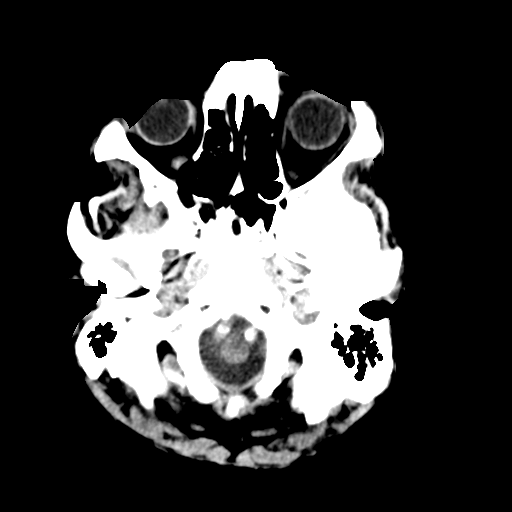
[im 3/33  bone]
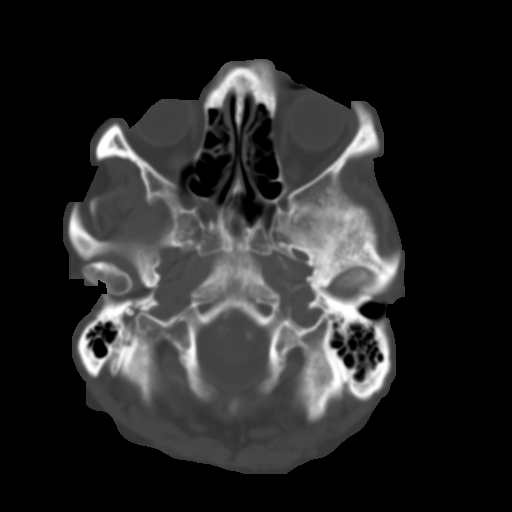
[im 6/33  brain]
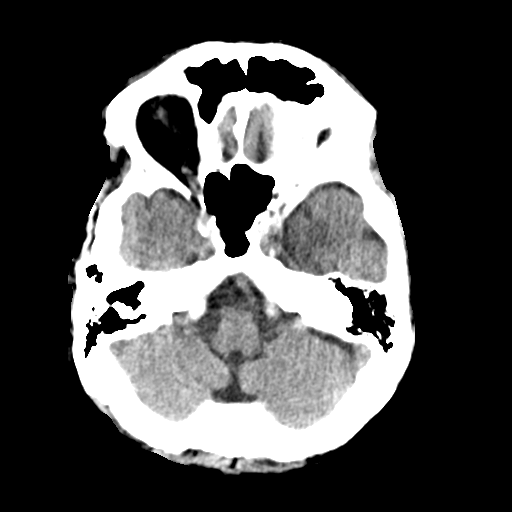
[im 9/33  brain]
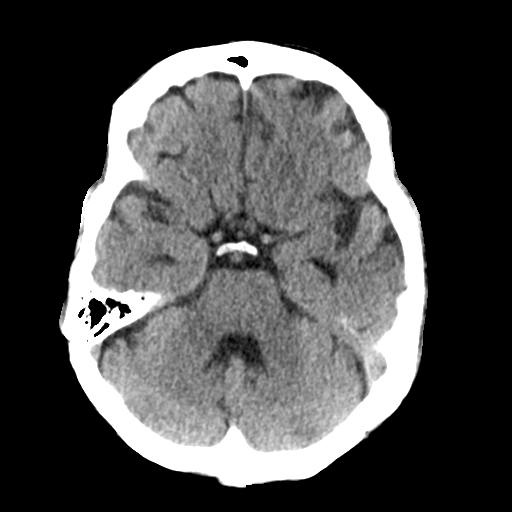
[im 12/33  brain]
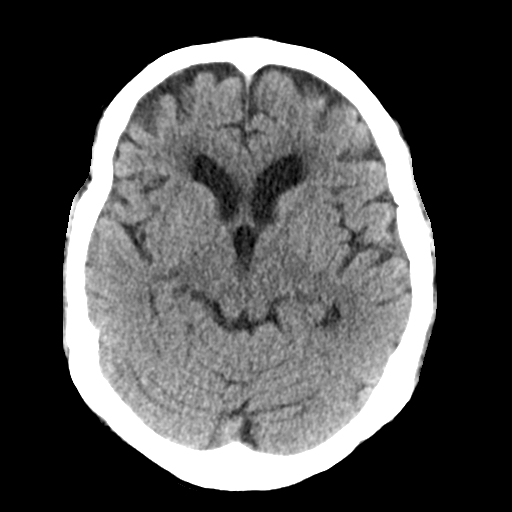
[im 15/33  brain]
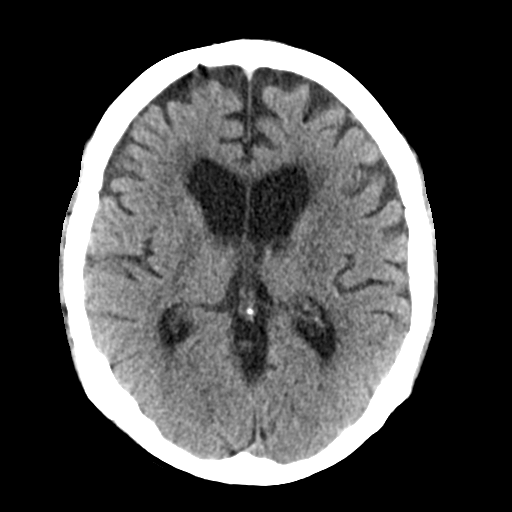
[im 15/33  bone]
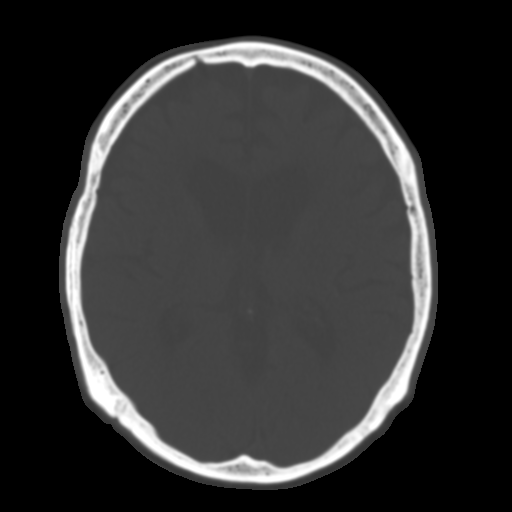
[im 18/33  brain]
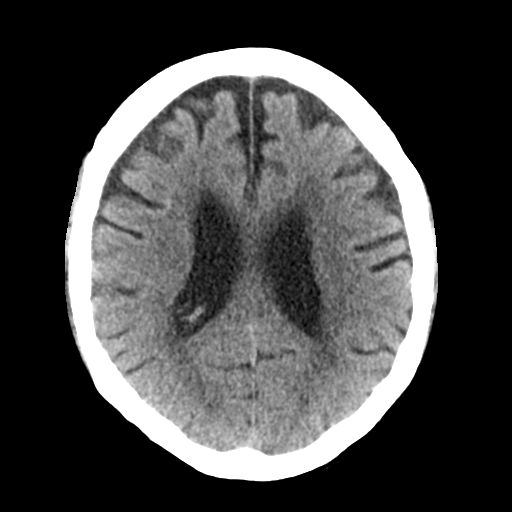
[im 21/33  brain]
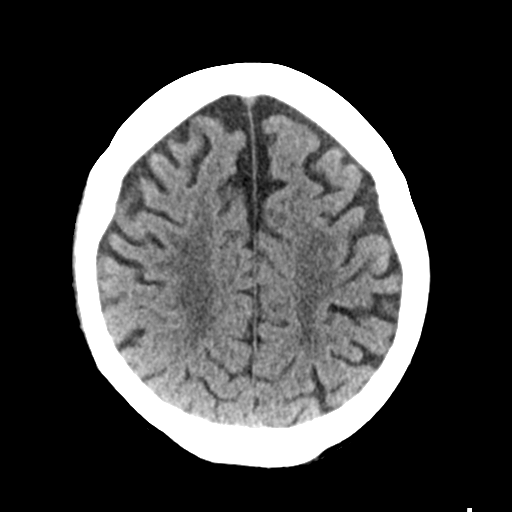
[im 25/33  brain]
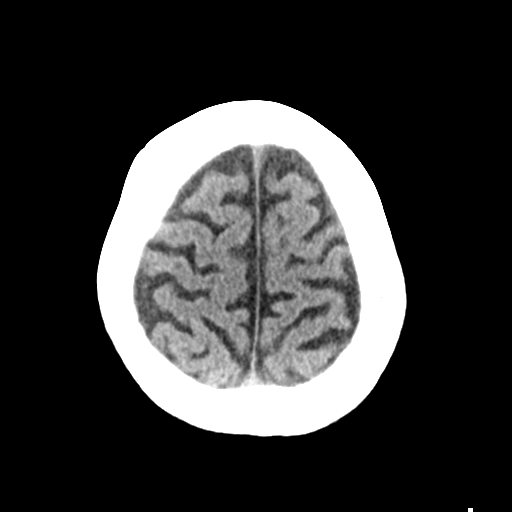
[im 27/33  brain]
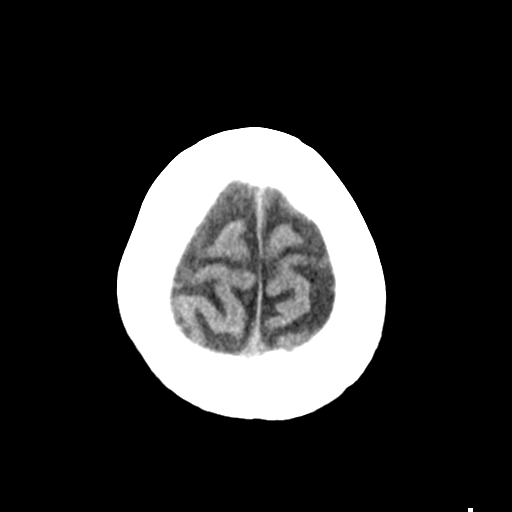
[im 27/33  bone]
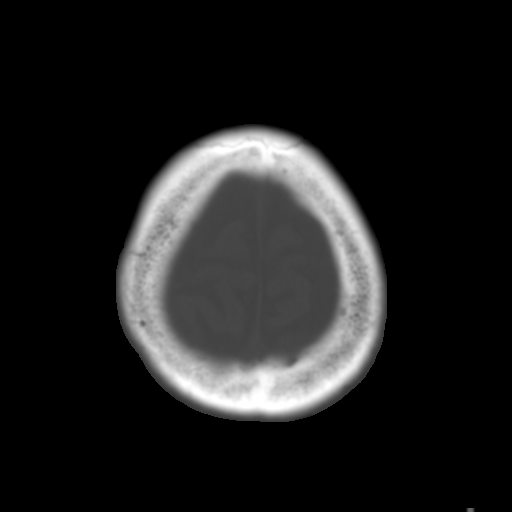
[im 30/33  brain]
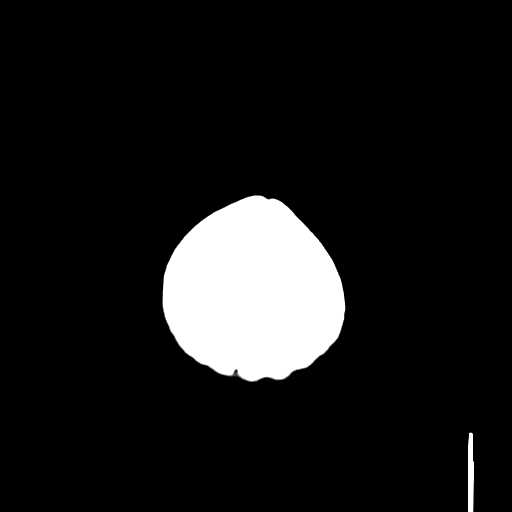

[Series 4: coronal soft tissue · coronal · 0.32mm/px · 3 of 66 slices shown]
[im 22/66  brain]
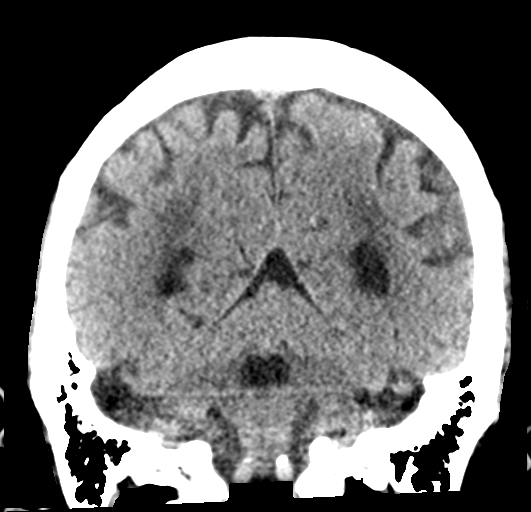
[im 29/66  brain]
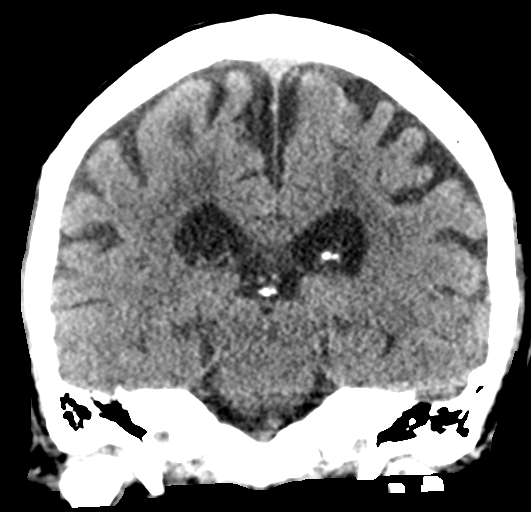
[im 37/66  brain]
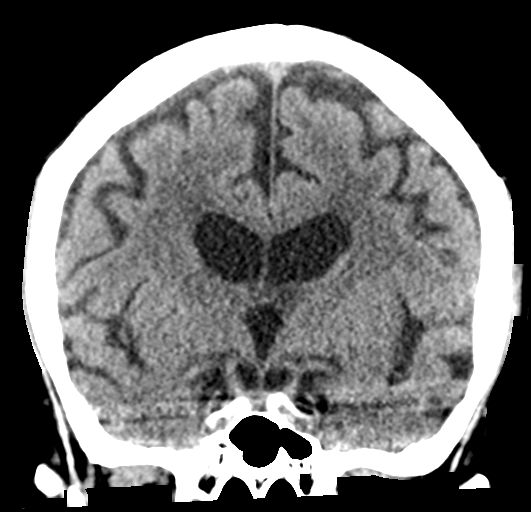

[Series 5: sagittal soft tissue · sagittal · 0.32mm/px · 3 of 57 slices shown]
[im 19/57  brain]
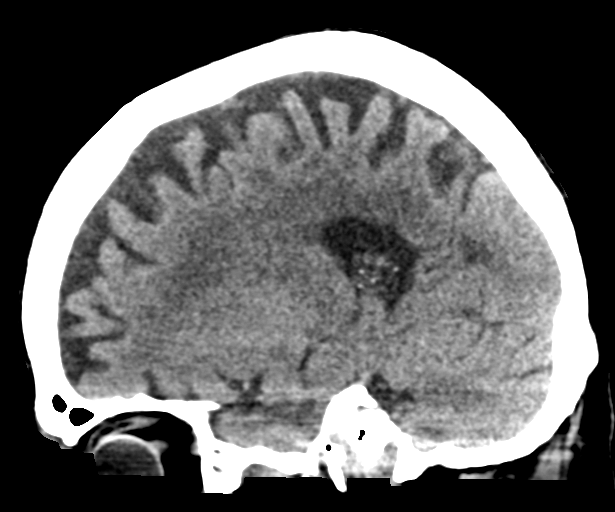
[im 29/57  brain]
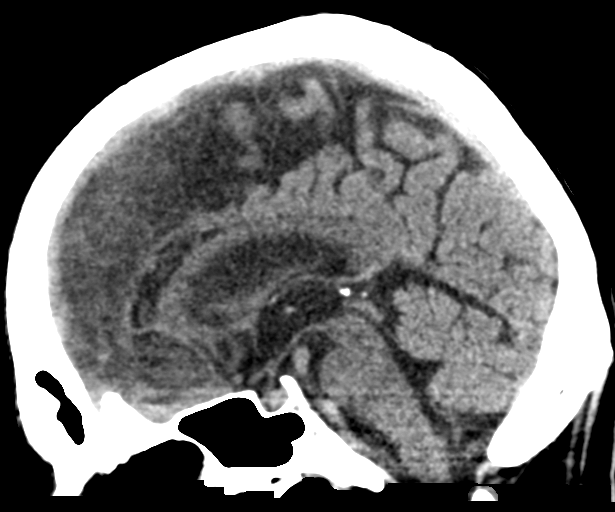
[im 38/57  brain]
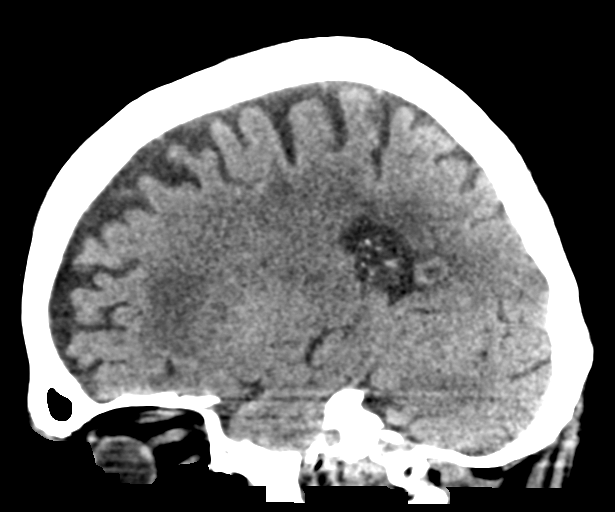

[16 of 47 positions shown; findings below may reference images not displayed]

FINDINGS: Brain: No evidence of acute infarction, hemorrhage, hydrocephalus,
extra-axial collection or mass lesion/mass effect. Atrophy and
chronic microvascular ischemic changes are noted.

Vascular: No hyperdense vessel or unexpected calcification.

Skull: Normal. Negative for fracture or focal lesion.

Sinuses/Orbits: No acute finding.

Other: None.
IMPRESSION: No acute abnormality.

## 2021-05-16 LAB — BLOOD GAS, VENOUS
Acid-Base Excess: 6.2 mmol/L — ABNORMAL HIGH (ref 0.0–2.0)
Bicarbonate: 33.1 mmol/L — ABNORMAL HIGH (ref 20.0–28.0)
O2 Saturation: 28.1 %
Patient temperature: 37
pCO2, Ven: 56 mmHg (ref 44.0–60.0)
pH, Ven: 7.38 (ref 7.250–7.430)

## 2021-05-17 ENCOUNTER — Other Ambulatory Visit: Payer: Self-pay | Admitting: *Deleted

## 2021-05-17 DIAGNOSIS — D509 Iron deficiency anemia, unspecified: Secondary | ICD-10-CM

## 2021-05-17 DIAGNOSIS — D72820 Lymphocytosis (symptomatic): Secondary | ICD-10-CM

## 2021-05-24 ENCOUNTER — Other Ambulatory Visit: Payer: Medicare PPO

## 2021-05-24 ENCOUNTER — Ambulatory Visit: Payer: Medicare PPO | Admitting: Oncology

## 2021-05-24 ENCOUNTER — Telehealth: Payer: Self-pay | Admitting: Oncology

## 2021-05-24 ENCOUNTER — Inpatient Hospital Stay: Payer: Medicare PPO

## 2021-05-24 ENCOUNTER — Inpatient Hospital Stay: Payer: Medicare PPO | Admitting: Oncology

## 2021-05-24 NOTE — Telephone Encounter (Signed)
Wife just called in wants to reschedule pt's appt. They can't get him in the car. Calll back at [phone]

## 2021-05-25 ENCOUNTER — Other Ambulatory Visit: Payer: Medicare PPO

## 2021-05-25 ENCOUNTER — Ambulatory Visit: Payer: Medicare PPO | Admitting: Oncology

## 2021-06-08 ENCOUNTER — Emergency Department: Payer: Medicare PPO

## 2021-06-08 ENCOUNTER — Other Ambulatory Visit: Payer: Self-pay

## 2021-06-08 ENCOUNTER — Observation Stay
Admission: EM | Admit: 2021-06-08 | Discharge: 2021-06-09 | Disposition: A | Discharge status: Home / Self Care | Payer: Medicare PPO | Attending: Family Medicine | Admitting: Family Medicine

## 2021-06-08 ENCOUNTER — Encounter: Payer: Self-pay | Admitting: Emergency Medicine

## 2021-06-08 DIAGNOSIS — Z79899 Other long term (current) drug therapy: Secondary | ICD-10-CM | POA: Insufficient documentation

## 2021-06-08 DIAGNOSIS — Z96641 Presence of right artificial hip joint: Secondary | ICD-10-CM | POA: Diagnosis not present

## 2021-06-08 DIAGNOSIS — I110 Hypertensive heart disease with heart failure: Secondary | ICD-10-CM | POA: Diagnosis not present

## 2021-06-08 DIAGNOSIS — Z87891 Personal history of nicotine dependence: Secondary | ICD-10-CM | POA: Diagnosis not present

## 2021-06-08 DIAGNOSIS — E119 Type 2 diabetes mellitus without complications: Secondary | ICD-10-CM | POA: Diagnosis not present

## 2021-06-08 DIAGNOSIS — G9349 Other encephalopathy: Secondary | ICD-10-CM | POA: Diagnosis present

## 2021-06-08 DIAGNOSIS — I509 Heart failure, unspecified: Secondary | ICD-10-CM | POA: Diagnosis not present

## 2021-06-08 DIAGNOSIS — R410 Disorientation, unspecified: Secondary | ICD-10-CM | POA: Diagnosis present

## 2021-06-08 DIAGNOSIS — Z7982 Long term (current) use of aspirin: Secondary | ICD-10-CM | POA: Diagnosis not present

## 2021-06-08 DIAGNOSIS — N390 Urinary tract infection, site not specified: Secondary | ICD-10-CM

## 2021-06-08 DIAGNOSIS — G9341 Metabolic encephalopathy: Principal | ICD-10-CM | POA: Insufficient documentation

## 2021-06-08 DIAGNOSIS — Z96651 Presence of right artificial knee joint: Secondary | ICD-10-CM | POA: Diagnosis not present

## 2021-06-08 DIAGNOSIS — G40909 Epilepsy, unspecified, not intractable, without status epilepticus: Secondary | ICD-10-CM

## 2021-06-08 DIAGNOSIS — Z20822 Contact with and (suspected) exposure to covid-19: Secondary | ICD-10-CM | POA: Diagnosis not present

## 2021-06-08 DIAGNOSIS — U071 COVID-19: Secondary | ICD-10-CM | POA: Insufficient documentation

## 2021-06-08 DIAGNOSIS — I5031 Acute diastolic (congestive) heart failure: Secondary | ICD-10-CM

## 2021-06-08 DIAGNOSIS — Z91040 Latex allergy status: Secondary | ICD-10-CM | POA: Insufficient documentation

## 2021-06-08 DIAGNOSIS — N3090 Cystitis, unspecified without hematuria: Secondary | ICD-10-CM

## 2021-06-08 DIAGNOSIS — R3129 Other microscopic hematuria: Secondary | ICD-10-CM | POA: Insufficient documentation

## 2021-06-08 LAB — COMPREHENSIVE METABOLIC PANEL
ALT: 12 U/L (ref 0–44)
AST: 18 U/L (ref 15–41)
Albumin: 3.2 g/dL — ABNORMAL LOW (ref 3.5–5.0)
Alkaline Phosphatase: 90 U/L (ref 38–126)
Anion gap: 10 (ref 5–15)
BUN: 18 mg/dL (ref 8–23)
CO2: 26 mmol/L (ref 22–32)
Calcium: 8.5 mg/dL — ABNORMAL LOW (ref 8.9–10.3)
Chloride: 100 mmol/L (ref 98–111)
Creatinine, Ser: 1.35 mg/dL — ABNORMAL HIGH (ref 0.61–1.24)
GFR, Estimated: 53 mL/min — ABNORMAL LOW (ref >60)
Glucose, Bld: 83 mg/dL (ref 70–99)
Potassium: 4.3 mmol/L (ref 3.5–5.1)
Sodium: 136 mmol/L (ref 135–145)
Total Bilirubin: 0.9 mg/dL (ref 0.3–1.2)
Total Protein: 6.4 g/dL — ABNORMAL LOW (ref 6.5–8.1)

## 2021-06-08 LAB — TROPONIN I (HIGH SENSITIVITY)
Troponin I (High Sensitivity): 25 ng/L — ABNORMAL HIGH (ref <18)
Troponin I (High Sensitivity): 20 ng/L — ABNORMAL HIGH (ref <18)

## 2021-06-08 LAB — LACTIC ACID, PLASMA
Lactic Acid, Venous: 1.8 mmol/L (ref 0.5–1.9)
Lactic Acid, Venous: 1.8 mmol/L (ref 0.5–1.9)

## 2021-06-08 LAB — URINALYSIS, COMPLETE (UACMP) WITH MICROSCOPIC
Bacteria, UA: NONE SEEN
Bilirubin Urine: NEGATIVE
Glucose, UA: NEGATIVE mg/dL
Ketones, ur: 20 mg/dL — AB
Nitrite: NEGATIVE
Protein, ur: 30 mg/dL — AB
Specific Gravity, Urine: 1.019 (ref 1.005–1.030)
pH: 7 (ref 5.0–8.0)

## 2021-06-08 LAB — PROCALCITONIN
Procalcitonin: 0.14 ng/mL
Procalcitonin: 0.12 ng/mL

## 2021-06-08 LAB — CBC WITH DIFFERENTIAL/PLATELET
Abs Immature Granulocytes: 0.03 10*3/uL (ref 0.00–0.07)
Basophils Absolute: 0.1 10*3/uL (ref 0.0–0.1)
Basophils Relative: 1 %
Eosinophils Absolute: 0 10*3/uL (ref 0.0–0.5)
Eosinophils Relative: 0 %
HCT: 43.3 % (ref 39.0–52.0)
Hemoglobin: 14 g/dL (ref 13.0–17.0)
Immature Granulocytes: 0 %
Lymphocytes Relative: 9 %
Lymphs Abs: 0.8 10*3/uL (ref 0.7–4.0)
MCH: 27.5 pg (ref 26.0–34.0)
MCHC: 32.3 g/dL (ref 30.0–36.0)
MCV: 85.1 fL (ref 80.0–100.0)
Monocytes Absolute: 0.9 10*3/uL (ref 0.1–1.0)
Monocytes Relative: 10 %
Neutro Abs: 7.1 10*3/uL (ref 1.7–7.7)
Neutrophils Relative %: 80 %
Platelets: 163 10*3/uL (ref 150–400)
RBC: 5.09 MIL/uL (ref 4.22–5.81)
RDW: 15.7 % — ABNORMAL HIGH (ref 11.5–15.5)
WBC: 8.9 10*3/uL (ref 4.0–10.5)
nRBC: 0 % (ref 0.0–0.2)

## 2021-06-08 LAB — RESP PANEL BY RT-PCR (FLU A&B, COVID) ARPGX2
Influenza A by PCR: NEGATIVE
Influenza B by PCR: NEGATIVE
SARS Coronavirus 2 by RT PCR: POSITIVE — AB

## 2021-06-08 LAB — PROTIME-INR
INR: 1 (ref 0.8–1.2)
Prothrombin Time: 13.6 seconds (ref 11.4–15.2)

## 2021-06-08 LAB — FERRITIN: Ferritin: 113 ng/mL (ref 24–336)

## 2021-06-08 LAB — BRAIN NATRIURETIC PEPTIDE: B Natriuretic Peptide: 123.4 pg/mL — ABNORMAL HIGH (ref 0.0–100.0)

## 2021-06-08 LAB — C-REACTIVE PROTEIN: CRP: 10.3 mg/dL — ABNORMAL HIGH (ref <1.0)

## 2021-06-08 LAB — APTT: aPTT: 31 seconds (ref 24–36)

## 2021-06-08 LAB — D-DIMER, QUANTITATIVE: D-Dimer, Quant: 1.44 ug/mL-FEU — ABNORMAL HIGH (ref 0.00–0.50)

## 2021-06-08 LAB — GLUCOSE, CAPILLARY: Glucose-Capillary: 106 mg/dL — ABNORMAL HIGH (ref 70–99)

## 2021-06-08 LAB — LACTATE DEHYDROGENASE: LDH: 132 U/L (ref 98–192)

## 2021-06-08 LAB — FIBRINOGEN: Fibrinogen: 487 mg/dL — ABNORMAL HIGH (ref 210–475)

## 2021-06-08 MED ORDER — ENOXAPARIN SODIUM 40 MG/0.4ML IJ SOSY: 40.0000 mg | PREFILLED_SYRINGE | INTRAMUSCULAR | Status: DC

## 2021-06-08 MED ORDER — PANTOPRAZOLE SODIUM 40 MG PO TBEC
40.0000 mg | DELAYED_RELEASE_TABLET | Freq: Every day | ORAL | Status: DC
  Administered 2021-06-08 – 2021-06-09 (×2): 40 mg via ORAL
  Filled 2021-06-08 (×2): qty 1

## 2021-06-08 MED ORDER — SODIUM CHLORIDE 0.9 % IV SOLN
200.0000 mg | Freq: Once | INTRAVENOUS | Status: AC
  Administered 2021-06-08: 200 mg via INTRAVENOUS
  Filled 2021-06-08: qty 200

## 2021-06-08 MED ORDER — FUROSEMIDE 10 MG/ML IJ SOLN
40.0000 mg | Freq: Two times a day (BID) | INTRAMUSCULAR | Status: DC
  Administered 2021-06-08 – 2021-06-09 (×2): 40 mg via INTRAVENOUS
  Filled 2021-06-08 (×2): qty 4

## 2021-06-08 MED ORDER — INSULIN ASPART 100 UNIT/ML IJ SOLN: 0.0000 [IU] | Freq: Three times a day (TID) | INTRAMUSCULAR | Status: DC

## 2021-06-08 MED ORDER — HYDROXYCHLOROQUINE SULFATE 200 MG PO TABS
200.0000 mg | ORAL_TABLET | Freq: Two times a day (BID) | ORAL | Status: DC
  Administered 2021-06-09 (×2): 200 mg via ORAL
  Filled 2021-06-08 (×4): qty 1

## 2021-06-08 MED ORDER — SODIUM CHLORIDE 0.9 % IV SOLN: INTRAVENOUS | Status: DC

## 2021-06-08 MED ORDER — LAMOTRIGINE 25 MG PO TABS
25.0000 mg | ORAL_TABLET | Freq: Two times a day (BID) | ORAL | Status: DC
  Administered 2021-06-08 – 2021-06-09 (×2): 25 mg via ORAL
  Filled 2021-06-08 (×3): qty 1

## 2021-06-08 MED ORDER — METOPROLOL TARTRATE 25 MG PO TABS
25.0000 mg | ORAL_TABLET | Freq: Two times a day (BID) | ORAL | Status: DC
  Administered 2021-06-08 – 2021-06-09 (×2): 25 mg via ORAL
  Filled 2021-06-08 (×2): qty 1

## 2021-06-08 MED ORDER — ACETAMINOPHEN 500 MG PO TABS
1000.0000 mg | ORAL_TABLET | Freq: Once | ORAL | Status: AC
Start: 2021-06-08 — End: 2021-06-08
  Administered 2021-06-08: 1000 mg via ORAL
  Filled 2021-06-08: qty 2

## 2021-06-08 MED ORDER — TRAZODONE HCL 50 MG PO TABS
50.0000 mg | ORAL_TABLET | Freq: Every day | ORAL | Status: DC
  Administered 2021-06-08: 22:00:00 50 mg via ORAL
  Filled 2021-06-08: qty 1

## 2021-06-08 MED ORDER — HYDROCOD POLI-CHLORPHE POLI ER 10-8 MG/5ML PO SUER: 5.0000 mL | Freq: Two times a day (BID) | ORAL | Status: DC | PRN

## 2021-06-08 MED ORDER — SODIUM CHLORIDE 0.9 % IV SOLN: 100.0000 mg | Freq: Every day | INTRAVENOUS | Status: DC

## 2021-06-08 MED ORDER — METHYLPREDNISOLONE SODIUM SUCC 40 MG IJ SOLR
40.0000 mg | Freq: Two times a day (BID) | INTRAMUSCULAR | Status: DC
  Administered 2021-06-09: 08:00:00 40 mg via INTRAVENOUS
  Filled 2021-06-08: qty 1

## 2021-06-08 MED ORDER — FUROSEMIDE 40 MG PO TABS: 40.0000 mg | ORAL_TABLET | Freq: Every day | ORAL | Status: DC

## 2021-06-08 MED ORDER — ACETAMINOPHEN 325 MG PO TABS
650.0000 mg | ORAL_TABLET | Freq: Four times a day (QID) | ORAL | Status: DC | PRN
  Administered 2021-06-09: 650 mg via ORAL
  Filled 2021-06-08: qty 2

## 2021-06-08 MED ORDER — LEFLUNOMIDE 20 MG PO TABS
10.0000 mg | ORAL_TABLET | Freq: Every day | ORAL | Status: DC
  Administered 2021-06-09 (×2): 10 mg via ORAL
  Filled 2021-06-08 (×3): qty 0.5

## 2021-06-08 MED ORDER — POLYETHYLENE GLYCOL 3350 17 G PO PACK: 17.0000 g | PACK | Freq: Every day | ORAL | Status: DC | PRN

## 2021-06-08 MED ORDER — MAGNESIUM HYDROXIDE 400 MG/5ML PO SUSP
30.0000 mL | Freq: Every day | ORAL | Status: DC | PRN
  Filled 2021-06-08: qty 30

## 2021-06-08 MED ORDER — ONDANSETRON HCL 4 MG/2ML IJ SOLN: 4.0000 mg | Freq: Four times a day (QID) | INTRAMUSCULAR | Status: DC | PRN

## 2021-06-08 MED ORDER — PREDNISONE 10 MG PO TABS: 5.0000 mg | ORAL_TABLET | Freq: Every day | ORAL | Status: DC

## 2021-06-08 MED ORDER — ONDANSETRON HCL 4 MG PO TABS: 4.0000 mg | ORAL_TABLET | Freq: Four times a day (QID) | ORAL | Status: DC | PRN

## 2021-06-08 MED ORDER — LACTATED RINGERS IV BOLUS
1000.0000 mL | Freq: Once | INTRAVENOUS | Status: AC
  Administered 2021-06-08: 1000 mL via INTRAVENOUS

## 2021-06-08 MED ORDER — SODIUM CHLORIDE 0.9 % IV SOLN: 200.0000 mg | Freq: Once | INTRAVENOUS | Status: DC

## 2021-06-08 MED ORDER — GUAIFENESIN-DM 100-10 MG/5ML PO SYRP
10.0000 mL | ORAL_SOLUTION | ORAL | Status: DC | PRN
  Administered 2021-06-09: 09:00:00 10 mL via ORAL
  Filled 2021-06-08: qty 10

## 2021-06-08 MED ORDER — SODIUM CHLORIDE 0.9 % IV SOLN
100.0000 mg | Freq: Every day | INTRAVENOUS | Status: DC
  Administered 2021-06-09: 100 mg via INTRAVENOUS
  Filled 2021-06-08: qty 20

## 2021-06-08 MED ORDER — SODIUM CHLORIDE 0.9 % IV SOLN
1.0000 g | Freq: Three times a day (TID) | INTRAVENOUS | Status: DC
  Administered 2021-06-09 (×2): 1 g via INTRAVENOUS
  Filled 2021-06-08 (×6): qty 1

## 2021-06-08 MED ORDER — FERROUS SULFATE 325 (65 FE) MG PO TABS
325.0000 mg | ORAL_TABLET | Freq: Every day | ORAL | Status: DC
  Administered 2021-06-09: 08:00:00 325 mg via ORAL
  Filled 2021-06-08: qty 1

## 2021-06-08 MED ORDER — THIAMINE HCL 100 MG PO TABS
200.0000 mg | ORAL_TABLET | Freq: Every day | ORAL | Status: DC
  Administered 2021-06-08 – 2021-06-09 (×2): 200 mg via ORAL
  Filled 2021-06-08 (×2): qty 2

## 2021-06-08 MED ORDER — DEXAMETHASONE SODIUM PHOSPHATE 10 MG/ML IJ SOLN
6.0000 mg | Freq: Once | INTRAMUSCULAR | Status: AC
  Administered 2021-06-08: 6 mg via INTRAVENOUS
  Filled 2021-06-08: qty 1

## 2021-06-08 MED ORDER — ASPIRIN 81 MG PO CHEW
81.0000 mg | CHEWABLE_TABLET | Freq: Every day | ORAL | Status: DC
  Administered 2021-06-08 – 2021-06-09 (×2): 81 mg via ORAL
  Filled 2021-06-08 (×2): qty 1

## 2021-06-08 MED ORDER — VITAMIN D 25 MCG (1000 UNIT) PO TABS
1000.0000 [IU] | ORAL_TABLET | Freq: Every day | ORAL | Status: DC
  Administered 2021-06-09: 09:00:00 1000 [IU] via ORAL
  Filled 2021-06-08: qty 1

## 2021-06-08 MED ORDER — GABAPENTIN 300 MG PO CAPS
900.0000 mg | ORAL_CAPSULE | Freq: Two times a day (BID) | ORAL | Status: DC
  Administered 2021-06-08 – 2021-06-09 (×2): 900 mg via ORAL
  Filled 2021-06-08 (×2): qty 3

## 2021-06-08 MED ORDER — MAGNESIUM OXIDE 400 MG PO TABS
400.0000 mg | ORAL_TABLET | Freq: Every day | ORAL | Status: DC
  Administered 2021-06-09: 400 mg via ORAL
  Filled 2021-06-08 (×2): qty 1

## 2021-06-08 MED ORDER — ENOXAPARIN SODIUM 80 MG/0.8ML IJ SOSY
0.5000 mg/kg | PREFILLED_SYRINGE | INTRAMUSCULAR | Status: DC
  Administered 2021-06-09: 65 mg via SUBCUTANEOUS
  Filled 2021-06-08 (×2): qty 0.65

## 2021-06-08 MED ORDER — LAMOTRIGINE 100 MG PO TABS
200.0000 mg | ORAL_TABLET | Freq: Two times a day (BID) | ORAL | Status: DC
  Administered 2021-06-08 – 2021-06-09 (×2): 200 mg via ORAL
  Filled 2021-06-08 (×2): qty 2

## 2021-06-08 MED ORDER — ZINC SULFATE 220 (50 ZN) MG PO CAPS
220.0000 mg | ORAL_CAPSULE | Freq: Every day | ORAL | Status: DC
  Administered 2021-06-08 – 2021-06-09 (×2): 220 mg via ORAL
  Filled 2021-06-08 (×2): qty 1

## 2021-06-08 MED ORDER — FOLIC ACID 1 MG PO TABS
1.0000 mg | ORAL_TABLET | Freq: Every day | ORAL | Status: DC
  Administered 2021-06-09: 09:00:00 1 mg via ORAL
  Filled 2021-06-08: qty 1

## 2021-06-08 MED ORDER — ASCORBIC ACID 500 MG PO TABS
500.0000 mg | ORAL_TABLET | Freq: Every day | ORAL | Status: DC
  Administered 2021-06-08 – 2021-06-09 (×2): 500 mg via ORAL
  Filled 2021-06-08 (×2): qty 1

## 2021-06-08 NOTE — ED Notes (Signed)
Patient given warm blanket.

## 2021-06-08 NOTE — H&P (Signed)
[place]   PATIENT NAME: Jeremiah [name]    MR#:  [id number]  DATE OF BIRTH:  1940/11/16  DATE OF ADMISSION:  06/08/2021  PRIMARY CARE PHYSICIAN: Derinda Late, MD   Patient is coming from: Home  REQUESTING/REFERRING PHYSICIAN: Brenton Grills, MD  CHIEF COMPLAINT:   Chief Complaint  Patient presents with   Code Sepsis  Altered mental status with confusion  HISTORY OF PRESENT ILLNESS:  Naeem Quillin. is a 81 y.o. Caucasian male with medical history significant for CHF, depression, type 2 diabetes mellitus, GERD, hypertension, rheumatoid arthritis,anxiety and seizure disorder, who presented to the ER with acute onset of altered mental status with confusion and decreased activity level in the setting of COVID-19 infection.  He had several family members at home who had [place].  He has been having generalized weakness with almost inability to lift his head off of the pillow earlier today.  He has not been talking has his usual.  The history was obtained by his patient's son at bedside earlier.  The patient denies any nausea or vomiting, or diarrhea.  No loss of taste or appetite.  He denies any fever or chills.  He has been having urinary frequency and urgency with dysuria.  ED Course: Upon presentation to the ER, temperature was 100.5 heart rate was 117 with respiratory rate of 21 and normal pulse oximetry of 94% on room air.  BP was 129/85.  Creatinine was 1.35 and albumin 3.2 with total bili 6.4.  Lactic acid was 1.8 and procalcitonin 0.14 and CBC was within normal.  COVID-19 PCR came back positive and influenza antigens negative UA showed 11-20 WBCs with large leukocytes and 20 ketones. EKG as reviewed by me : EKG showed sinus tachycardia with a rate of 111 with left bundle blanch block Imaging: Chest x-ray showed low lung volumes with vascular congestion  The patient was given 6 mg of IV Decadron, 1 g of p.o. Tylenol, 1 L bolus of IV lactated Ringer and was started on IV  remdesivir. PAST MEDICAL HISTORY:   Past Medical History:  Diagnosis Date   Allergy    Anemia    Anxiety    Arthritis    CHF (congestive heart failure) (HCC)    Collagen vascular disease ([place])    Depression    Diabetes mellitus without complication (HCC)    GERD (gastroesophageal reflux disease)    Hypertension    Rheumatoid arthritis ([place])    Seizures ([place])    Sepsis ([place]) 05/23/2015    PAST SURGICAL HISTORY:   Past Surgical History:  Procedure Laterality Date   BACK SURGERY     back surgery five times  04/2016   CARPAL TUNNEL RELEASE     COLONOSCOPY WITH PROPOFOL N/A 04/27/2020   Procedure: COLONOSCOPY WITH PROPOFOL;  Surgeon: Toledo, Benay Pike, MD;  Location: [facility] ENDOSCOPY;  Service: Gastroenterology;  Laterality: N/A;   ESOPHAGOGASTRODUODENOSCOPY (EGD) WITH PROPOFOL N/A 04/27/2020   Procedure: ESOPHAGOGASTRODUODENOSCOPY (EGD) WITH PROPOFOL;  Surgeon: Toledo, Benay Pike, MD;  Location: [facility] ENDOSCOPY;  Service: Gastroenterology;  Laterality: N/A;   HERNIA REPAIR     JOINT REPLACEMENT     knee replacement right     PEG PLACEMENT N/A 04/24/2019   Procedure: PERCUTANEOUS ENDOSCOPIC GASTROSTOMY (PEG) PLACEMENT;  Surgeon: Toledo, Benay Pike, MD;  Location: [facility] ENDOSCOPY;  Service: Gastroenterology;  Laterality: N/A;   right hip replacement     rotator cuff surgery     SPINE SURGERY  SOCIAL HISTORY:   Social History   Tobacco Use   Smoking status: Former    Packs/day: 1.00    Years: 20.00    Pack years: 20.00    Types: Cigarettes   Smokeless tobacco: Never  Substance Use Topics   Alcohol use: No    FAMILY HISTORY:   Family History  Problem Relation Age of Onset   Hypertension Other    Hypertension Mother    Dementia Mother    Dementia Father    Prostate cancer Neg Hx    Bladder Cancer Neg Hx    Kidney cancer Neg Hx     DRUG ALLERGIES:   Allergies  Allergen Reactions   Cefoperazone Anaphylaxis   Penicillins  Anaphylaxis and Other (See Comments)    Has patient had a PCN reaction causing immediate rash, facial/tongue/throat swelling, SOB or lightheadedness with hypotension: Yes Has patient had a PCN reaction causing severe rash involving mucus membranes or skin necrosis: No Has patient had a PCN reaction that required hospitalization No Has patient had a PCN reaction occurring within the last 10 years: No If all of the above answers are "NO", then may proceed with Cephalosporin use.   Latex Rash    REVIEW OF SYSTEMS:   ROS As per history of present illness. All pertinent systems were reviewed above. Constitutional, HEENT, cardiovascular, respiratory, GI, GU, musculoskeletal, neuro, psychiatric, endocrine, integumentary and hematologic systems were reviewed and are otherwise negative/unremarkable except for positive findings mentioned above in the HPI.   MEDICATIONS AT HOME:   Prior to Admission medications   Medication Sig Start Date End Date Taking? Authorizing Provider  aspirin 81 MG chewable tablet Chew 81 mg by mouth daily.   Yes [provider]  cholecalciferol (VITAMIN D) 25 MCG tablet Take 1 tablet (1,000 Units total) by mouth daily. 07/10/19  Yes Wieting, Richard, MD  ferrous sulfate 325 (65 FE) MG tablet Take 325 mg by mouth daily with breakfast.   Yes [provider]  folic acid (FOLVITE) 1 MG tablet Take 1 tablet (1 mg total) by mouth daily. 02/27/20  Yes Corcoran, Drue Second, MD  furosemide (LASIX) 40 MG tablet Take 40 mg by mouth daily.  01/17/19  Yes [provider]  gabapentin (NEURONTIN) 300 MG capsule Take 900 mg by mouth 2 (two) times daily. 09/21/19  Yes [provider]  hydroxychloroquine (PLAQUENIL) 200 MG tablet Take 200 mg by mouth 2 (two) times daily. 06/16/19  Yes [provider]  lamoTRIgine (LAMICTAL) 200 MG tablet Take 200 mg by mouth 2 (two) times daily.   Yes [provider]  lamoTRIgine (LAMICTAL) 25 MG tablet Take 25  mg by mouth 2 (two) times daily. 04/21/18  Yes [provider]  leflunomide (ARAVA) 10 MG tablet Take 10 mg by mouth daily. 12/10/19  Yes [provider]  magnesium oxide (MAG-OX) 400 MG tablet Take 400 mg by mouth daily.   Yes [provider]  omeprazole (PRILOSEC) 40 MG capsule Take 40 mg by mouth daily as needed.    Yes [provider]  predniSONE (DELTASONE) 5 MG tablet 4 tabs po day1, 3 tabs po day2; 2 tabs po day3,4; 1 tab po day5,6; 1/2 tab po day7,8,9,10 Patient taking differently: Take 5 mg by mouth daily with breakfast. 07/10/19  Yes Wieting, Richard, MD  traZODone (DESYREL) 50 MG tablet Take 50 mg by mouth at bedtime.   Yes [provider]  acetaminophen (TYLENOL) 325 MG tablet Take 2 tablets (650 mg total)  by mouth every 6 (six) hours as needed for mild pain (or Fever >/= 101). Patient not taking: Reported on 05/26/2020 02/24/18   Nicholes Mango, MD  ascorbic acid (VITAMIN C) 500 MG tablet Take 1 tablet (500 mg total) by mouth daily. Patient not taking: Reported on 05/26/2020 07/10/19   Loletha Grayer, MD  Etanercept (ENBREL [place]) Inject into the skin. Patient not taking: Reported on 05/26/2020    [provider]  feeding supplement, ENSURE ENLIVE, (ENSURE ENLIVE) LIQD Take 237 mLs by mouth 3 (three) times daily between meals. Patient not taking: Reported on 05/26/2020 07/10/19   Loletha Grayer, MD  metoprolol tartrate (LOPRESSOR) 25 MG tablet Take 1 tablet (25 mg total) by mouth 2 (two) times daily. 07/10/19 07/09/20  Loletha Grayer, MD  nystatin (MYCOSTATIN) 100000 UNIT/ML suspension Take 5 mLs (500,000 Units total) by mouth 4 (four) times daily. Patient not taking: Reported on 05/26/2020 07/10/19   Loletha Grayer, MD  polyethylene glycol (MIRALAX / GLYCOLAX) 17 g packet Take 17 g by mouth daily as needed. 07/10/19   Loletha Grayer, MD  sulfamethoxazole-trimethoprim (BACTRIM DS) 800-160 MG tablet Take 1 tablet by mouth 2 (two) times  daily. Patient not taking: Reported on 05/04/2021 07/23/20   Carrie Mew, MD  thiamine 100 MG tablet Take 1 tablet (100 mg total) by mouth daily. Patient taking differently: Take 200 mg by mouth daily. 04/27/19   [name], MD  zinc sulfate 220 (50 Zn) MG capsule Take 1 capsule (220 mg total) by mouth daily. Patient not taking: Reported on 05/26/2020 07/10/19   Loletha Grayer, MD      VITAL SIGNS:  Blood pressure 119/72, pulse 83, temperature 98.6 F (37 C), temperature source Oral, resp. rate 18, height 6' (1.829 m), weight 131.7 kg, SpO2 93 %.  PHYSICAL EXAMINATION:  Physical Exam  GENERAL:  81 y.o.-year-old Caucasian male patient lying in the bed with no acute distress.  He was fairly confused and slow to answer questions. EYES: Pupils equal, round, reactive to light and accommodation. No scleral icterus. Extraocular muscles intact.  HEENT: Head atraumatic, normocephalic. Oropharynx and nasopharynx clear.  NECK:  Supple, no jugular venous distention. No thyroid enlargement, no tenderness.  LUNGS: Mild admission bibasilar breath sounds.  No use of accessory muscles of respiration.  CARDIOVASCULAR: Regular rate and rhythm, S1, S2 normal. No murmurs, rubs, or gallops.  ABDOMEN: Soft, nondistended, nontender. Bowel sounds present. No organomegaly or mass.  EXTREMITIES: 2+ bilateral lower extremity pitting l edema with no cyanosis, or clubbing.  NEUROLOGIC: Cranial nerves II through XII are intact. Muscle strength 5/5 in all extremities. Sensation intact. Gait not checked.  PSYCHIATRIC: The patient is alert and oriented x 1.  Normal affect and good eye contact. SKIN: No obvious rash, lesion, or ulcer.   LABORATORY PANEL:   CBC Recent Labs  Lab 06/08/21 1237  WBC 8.9  HGB 14.0  HCT 43.3  PLT 163   ------------------------------------------------------------------------------------------------------------------  Chemistries  Recent Labs  Lab 06/08/21 1237  NA 136  K 4.3   CL 100  CO2 26  GLUCOSE 83  BUN 18  CREATININE 1.35*  CALCIUM 8.5*  AST 18  ALT 12  ALKPHOS 90  BILITOT 0.9   ------------------------------------------------------------------------------------------------------------------  Cardiac Enzymes No results for input(s): TROPONINI in the last 168 hours. ------------------------------------------------------------------------------------------------------------------  RADIOLOGY:  CT HEAD WO CONTRAST (5MM)  Result Date: 06/08/2021 CLINICAL DATA:  Mental status change. EXAM: CT HEAD WITHOUT CONTRAST TECHNIQUE: Contiguous axial images were obtained from the base of the skull  through the vertex without intravenous contrast. RADIATION DOSE REDUCTION: This exam was performed according to the departmental dose-optimization program which includes automated exposure control, adjustment of the mA and/or kV according to patient size and/or use of iterative reconstruction technique. COMPARISON:  CT head dated August 22, 2019 FINDINGS: Brain: No evidence of acute infarction, hemorrhage, hydrocephalus, extra-axial collection or mass lesion/mass effect. Low-attenuation of the periventricular white matter presumed chronic microvascular ischemic changes. Mild cerebral volume loss. Vascular: No hyperdense vessel or unexpected calcification. Skull: Normal. Negative for fracture or focal lesion. Sinuses/Orbits: Patchy opacification of ethmoid air cells. Remaining paranasal sinuses and mastoid air cells are clear. Other: None. IMPRESSION: 1. No acute intracranial abnormality. 2. Mild cerebral atrophy and chronic microvascular ischemic changes of the white matter, unchanged. Electronically Signed   By: Keane Police D.O.   On: 06/08/2021 14:01   DG Chest Port 1 View  Result Date: 06/08/2021 CLINICAL DATA:  Possible sepsis. EXAM: PORTABLE CHEST 1 VIEW COMPARISON:  05/04/2021 FINDINGS: 1304 hours. Low lung volumes. Cardiopericardial silhouette is at upper limits of normal  for size. There is pulmonary vascular congestion without overt pulmonary edema. The lungs are clear without focal pneumonia, edema, pneumothorax or pleural effusion. Telemetry leads overlie the chest. IMPRESSION: Low volume chest film with vascular congestion. Electronically Signed   By: Misty Stanley M.D.   On: 06/08/2021 13:22      IMPRESSION AND PLAN:  Principal Problem:   Encephalopathy due to COVID-19 virus  1.  Acute metabolic cephalopathy likely secondary to COVID-19.  He has suspected UTI that could be contributing to his encephalopathy. - The patient will be admitted to a medical telemetry bed. - We will place on isolation. - We will continue IV remdesivir. - We will follow inflammatory markers. - We will place on vitamin C, vitamin D3, zinc sulfate and aspirin. - We will place on IV Azactam for his UTI and follow urine culture. - Neurochecks will be followed every 4 hours for 24 hours. - We will continue on IV steroids for encephalopathy.  2.  Mild acute CHF. - She will be diuresed with IV Lasix - We will follow serial troponins. - We will check a 2D echo.  3.  Rheumatoid arthritis. - We will continue his Plaquenil.  4.  Peripheral neuropathy. - We will continue his Neurontin.  5.  Seizure disorder. - We will continue Lamictal and Neurontin.  6.  Mild kidney injury, likely prerenal from acute CHF. - We will monitor renal functions with diuresis.  7.  Type diabetes mellitus. - We will place him on supplement coverage with NovoLog .  DVT prophylaxis: Lovenox. Code Status: full code. Family Communication:  The plan of care was discussed in details with the patient (and family). I answered all questions. The patient agreed to proceed with the above mentioned plan. Further management will depend upon hospital course. Disposition Plan: Back to previous home environment Consults called: none.  All the records are reviewed and case discussed with ED provider.  Status  is: Inpatient   At the time of the admission, it appears that the appropriate admission status for this patient is inpatient.  This is judged to be reasonable and necessary in order to provide the required intensity of service to ensure the patient's safety given the presenting symptoms, physical exam findings and initial radiographic and laboratory data in the context of comorbid conditions.  The patient requires inpatient status due to high intensity of service, high risk of further deterioration and high  frequency of surveillance required.  I certify that at the time of admission, it is my clinical judgment that the patient will require inpatient hospital care extending more than 2 midnights.                            Dispo: The patient is from: Home              Anticipated d/c is to: Home              Patient currently is not medically stable to d/c.              Difficult to place patient: No  Christel Mormon M.D on 06/08/2021 at 5:29 PM  [facility]   From 7 PM-7 AM, contact night-coverage [email or web address]  CC: Primary care physician; Derinda Late, MD

## 2021-06-08 NOTE — ED Notes (Signed)
Patient placed on bedpan. Small BM by patient. Patient cleaned and new chuck pad placed.

## 2021-06-08 NOTE — ED Provider Notes (Signed)
[facility] Provider Note    Event Date/Time   First MD Initiated Contact with Patient 06/08/21 1235     (approximate)   History   Code Sepsis   HPI  Jeremiah [name]. is a 81 y.o. male who presents to the ED for evaluation of Code Sepsis   Review outpatient PCP visit from September.  DM, HTN, seizure disorder on Lamictal and RA.  Obesity. History of RA on leflunomide and chronic low-dose prednisone.  Patient presents from home via EMS for evaluation of confusion and decreased activity level in the setting of COVID-19.  Reportedly many family members at home also have COVID-19.  Today, patient has been less active and less talkative.  Reportedly earlier today he was unable to even "lift his head off of the pillow."  History is provided by patient's sons at the bedside as he is confused and cannot provide any relevant history.  Patient denies any complaints and says he feels fine.  Denies pain.  Normally, he does not ambulate much anymore.  He does stand and can pivot some.   Physical Exam   Triage Vital Signs: ED Triage Vitals  Enc Vitals Group     BP 06/08/21 1239 129/85     Pulse Rate 06/08/21 1239 (!) 117     Resp 06/08/21 1239 (!) 21     Temp 06/08/21 1239 (!) 100.5 F (38.1 C)     Temp Source 06/08/21 1239 Oral     SpO2 06/08/21 1231 (!) 88 %     Weight 06/08/21 1234 290 lb 6.4 oz (131.7 kg)     Height 06/08/21 1234 6' (1.829 m)     Head Circumference --      Peak Flow --      Pain Score 06/08/21 1234 0     Pain Loc --      Pain Edu? --      Excl. in [place]? --     Most recent vital signs: Vitals:   06/08/21 1402 06/08/21 1500  BP:  119/74  Pulse:  100  Resp:  10  Temp: 98.6 F (37 C)   SpO2:  97%    General: Awake, no distress.  Follows commands in all 4 extremities.  Pleasant.  Does not answer most of my questions, such as when I ask orientation questions.  Seems confused and requires frequent redirection. CV:  Good peripheral  perfusion.  Tachycardic and regular. Resp:  Normal effort.  Minimal tachypnea to the low 20s, no distress, no wheezing. Abd:  No distention.  Nontender. MSK:  No deformity noted.  Pitting edema to bilateral lower extremities symmetrically without overlying skin changes or signs of trauma. Neuro:  No focal deficits appreciated.  Follows commands in all 4. Other:     ED Results / Procedures / Treatments   Labs (all labs ordered are listed, but only abnormal results are displayed) Labs Reviewed  RESP PANEL BY RT-PCR (FLU A&B, COVID) ARPGX2 - Abnormal; Notable for the following components:      Result Value   SARS Coronavirus 2 by RT PCR POSITIVE (*)    All other components within normal limits  COMPREHENSIVE METABOLIC PANEL - Abnormal; Notable for the following components:   Creatinine, Ser 1.35 (*)    Calcium 8.5 (*)    Total Protein 6.4 (*)    Albumin 3.2 (*)    GFR, Estimated 53 (*)    All other components within normal limits  CBC WITH  DIFFERENTIAL/PLATELET - Abnormal; Notable for the following components:   RDW 15.7 (*)    All other components within normal limits  CULTURE, BLOOD (ROUTINE X 2)  CULTURE, BLOOD (ROUTINE X 2)  URINE CULTURE  LACTIC ACID, PLASMA  PROTIME-INR  APTT  PROCALCITONIN  LACTIC ACID, PLASMA  URINALYSIS, COMPLETE (UACMP) WITH MICROSCOPIC    EKG  Sinus tachycardia with a rate of 111 bpm.  Normal axis.  Left bundle.  No evidence of acute ischemia per [place] criteria.  RADIOLOGY 1 view CXR reviewed by me with low lung volumes CT head reviewed by me without evidence of acute intracranial pathology.  Official radiology report(s): CT HEAD WO CONTRAST (5MM)  Result Date: 06/08/2021 CLINICAL DATA:  Mental status change. EXAM: CT HEAD WITHOUT CONTRAST TECHNIQUE: Contiguous axial images were obtained from the base of the skull through the vertex without intravenous contrast. RADIATION DOSE REDUCTION: This exam was performed according to the  departmental dose-optimization program which includes automated exposure control, adjustment of the mA and/or kV according to patient size and/or use of iterative reconstruction technique. COMPARISON:  CT head dated August 22, 2019 FINDINGS: Brain: No evidence of acute infarction, hemorrhage, hydrocephalus, extra-axial collection or mass lesion/mass effect. Low-attenuation of the periventricular white matter presumed chronic microvascular ischemic changes. Mild cerebral volume loss. Vascular: No hyperdense vessel or unexpected calcification. Skull: Normal. Negative for fracture or focal lesion. Sinuses/Orbits: Patchy opacification of ethmoid air cells. Remaining paranasal sinuses and mastoid air cells are clear. Other: None. IMPRESSION: 1. No acute intracranial abnormality. 2. Mild cerebral atrophy and chronic microvascular ischemic changes of the white matter, unchanged. Electronically Signed   By: Keane Police D.O.   On: 06/08/2021 14:01   DG Chest Port 1 View  Result Date: 06/08/2021 CLINICAL DATA:  Possible sepsis. EXAM: PORTABLE CHEST 1 VIEW COMPARISON:  05/04/2021 FINDINGS: 1304 hours. Low lung volumes. Cardiopericardial silhouette is at upper limits of normal for size. There is pulmonary vascular congestion without overt pulmonary edema. The lungs are clear without focal pneumonia, edema, pneumothorax or pleural effusion. Telemetry leads overlie the chest. IMPRESSION: Low volume chest film with vascular congestion. Electronically Signed   By: Misty Stanley M.D.   On: 06/08/2021 13:22    PROCEDURES and INTERVENTIONS:  .1-3 Lead EKG Interpretation Performed by: Vladimir Crofts, MD Authorized by: Vladimir Crofts, MD     Interpretation: abnormal     ECG rate:  106   ECG rate assessment: tachycardic     Rhythm: sinus tachycardia     Ectopy: none     Conduction: normal    Medications  lactated ringers bolus 1,000 mL (0 mLs Intravenous Stopped 06/08/21 1431)  acetaminophen (TYLENOL) tablet 1,000 mg  (1,000 mg Oral Given 06/08/21 1253)  dexamethasone (DECADRON) injection 6 mg (6 mg Intravenous Given 06/08/21 1429)     IMPRESSION / MDM / ASSESSMENT AND PLAN / ED COURSE  I reviewed the triage vital signs and the nursing notes.  81 year old male with history of RA largely bedbound at baseline presents to the ED encephalopathic, likely metabolic encephalopathy in the setting of COVID-19.  He is febrile and tachycardic on arrival, improving with IV fluids and antipyretics.  Blood work is fairly reassuring, no leukocytosis, metabolic panel with creatinine around baseline and lactic acid is low.  Procalcitonin is fairly low as well, less suggestive of bacterial etiology of his symptoms.  He is testing positive for COVID-19.  No intracranial pathology on CT head and no current indications for antibiotics.  Awaiting cath urine  at the time of signout to assess for need for antibiotics, and regardless of this anticipate he would benefit from medical admission considering his encephalopathy.  Clinical Course as of 06/08/21 1504  Thu Jun 08, 2021  1254 Reassessed.  Difficult to get much history out of him. [DS]  11 Called son, Jeremiah [name], no response.  [DS]  1425 Called another son, Jeremiah [name], he's just walking into the ED now out front. He'll meet me in the room [DS]  24 Now 2 sons are at the bedside.  We discussed plan of care and work-up.  We discussed COVID-19, confusion and encephalopathy.  We discussed the possibility of UTI, but on have a urine sample yet.  We discussed In-N-Out catheterization and they are agreeable.  We discussed disposition and they are leaning towards medical admission due to difficulty caring for him at home at baseline, and exacerbated by his current illness. [DS]  1427 Pt signed out to Dr. Joni Fears pending UA with the anticipation of admission after this [DS]    Clinical Course User Index [DS] Vladimir Crofts, MD     FINAL CLINICAL IMPRESSION(S) / ED DIAGNOSES   Final  diagnoses:  Metabolic encephalopathy  [facility]-00     Rx / DC Orders   ED Discharge Orders     None        Note:  This document was prepared using Dragon voice recognition software and may include unintentional dictation errors.   Vladimir Crofts, MD 06/08/21 1505

## 2021-06-08 NOTE — ED Notes (Signed)
Update given to son- Jeremiah [name].

## 2021-06-08 NOTE — ED Notes (Signed)
Patient to CT at this time

## 2021-06-08 NOTE — ED Triage Notes (Signed)
Patient to ED via [facility] from home as a sepsis alert. Patient is Covid positive and has a history of UTI's per son who is caregiver. Patient is alert and oriented.

## 2021-06-08 NOTE — ED Notes (Signed)
Patient assisted onto bed pan. Patient had large BM. Cleaned by this RN.

## 2021-06-08 NOTE — Progress Notes (Signed)
Pharmacy Antibiotic Note  Jeremiah [name]. is a 81 y.o. male admitted on 06/08/2021 with encephalopathy due to COVID-19 virus and a UTI.  He has a noted allergy to penicillins and cephalosporins (anaphylaxis). Pharmacy has been consulted for aztreonam dosing.  Plan: start aztreonam 1 gram IV every 8 hours  Height: 6' (182.9 cm) Weight: 131.7 kg (290 lb 6.4 oz) IBW/kg (Calculated) : 77.6  Temp (24hrs), Avg:99.6 F (37.6 C), Min:98.6 F (37 C), Max:100.5 F (38.1 C)  Recent Labs  Lab 06/08/21 1237 06/08/21 1736  WBC 8.9  --   CREATININE 1.35*  --   LATICACIDVEN 1.8 1.8    Estimated Creatinine Clearance: 62.3 mL/min (A) (by C-G formula based on SCr of 1.35 mg/dL (H)).    Allergies  Allergen Reactions   Cefoperazone Anaphylaxis   Penicillins Anaphylaxis and Other (See Comments)    Has patient had a PCN reaction causing immediate rash, facial/tongue/throat swelling, SOB or lightheadedness with hypotension: Yes Has patient had a PCN reaction causing severe rash involving mucus membranes or skin necrosis: No Has patient had a PCN reaction that required hospitalization No Has patient had a PCN reaction occurring within the last 10 years: No If all of the above answers are "NO", then may proceed with Cephalosporin use.   Latex Rash    Antimicrobials this admission: 01/19 aztreonam >>   Microbiology results: 01/19 BCx: pending 01/19 UCx: pending  Thank you for allowing pharmacy to be a part of this patients care.  Dallie Piles 06/08/2021 6:43 PM

## 2021-06-08 NOTE — Consult Note (Signed)
Remdesivir - Pharmacy Brief Note   O:  ALT: 13>12 CXR: Pulmonary vasc congestion, clear. Not c/w focal PNA. SpO2: 92-97% on RA   A/P:  Remdesivir 200 mg IVPB once followed by 100 mg IVPB daily x 4 days.   Lorna Dibble, PharmD, [facility] Clinical Pharmacist 06/08/2021 3:20 PM

## 2021-06-09 ENCOUNTER — Encounter: Payer: Self-pay | Admitting: Family Medicine

## 2021-06-09 ENCOUNTER — Inpatient Hospital Stay
Admit: 2021-06-09 | Discharge: 2021-06-09 | Disposition: A | Discharge status: Home / Self Care | Payer: Medicare PPO | Attending: Family Medicine | Admitting: Family Medicine

## 2021-06-09 DIAGNOSIS — G9341 Metabolic encephalopathy: Secondary | ICD-10-CM

## 2021-06-09 DIAGNOSIS — R3129 Other microscopic hematuria: Secondary | ICD-10-CM

## 2021-06-09 DIAGNOSIS — U071 COVID-19: Secondary | ICD-10-CM

## 2021-06-09 DIAGNOSIS — M05749 Rheumatoid arthritis with rheumatoid factor of unspecified hand without organ or systems involvement: Secondary | ICD-10-CM

## 2021-06-09 DIAGNOSIS — G9349 Other encephalopathy: Secondary | ICD-10-CM

## 2021-06-09 LAB — ECHOCARDIOGRAM COMPLETE
AR max vel: 2.14 cm2
AV Area VTI: 2.48 cm2
AV Area mean vel: 1.91 cm2
AV Mean grad: 2 mmHg
AV Peak grad: 3.6 mmHg
Ao pk vel: 0.95 m/s
Area-P 1/2: 3.15 cm2
Calc EF: 15.7 %
Height: 72 in
MV VTI: 1.95 cm2
S' Lateral: 4.1 cm
Single Plane A2C EF: 21.5 %
Single Plane A4C EF: 6.8 %
Weight: 3802.49 oz

## 2021-06-09 LAB — COMPREHENSIVE METABOLIC PANEL
ALT: 13 U/L (ref 0–44)
AST: 20 U/L (ref 15–41)
Albumin: 3.1 g/dL — ABNORMAL LOW (ref 3.5–5.0)
Alkaline Phosphatase: 84 U/L (ref 38–126)
Anion gap: 8 (ref 5–15)
BUN: 20 mg/dL (ref 8–23)
CO2: 26 mmol/L (ref 22–32)
Calcium: 8.4 mg/dL — ABNORMAL LOW (ref 8.9–10.3)
Chloride: 104 mmol/L (ref 98–111)
Creatinine, Ser: 0.97 mg/dL (ref 0.61–1.24)
GFR, Estimated: >60 mL/min (ref >60)
Glucose, Bld: 99 mg/dL (ref 70–99)
Potassium: 3.7 mmol/L (ref 3.5–5.1)
Sodium: 138 mmol/L (ref 135–145)
Total Bilirubin: 0.5 mg/dL (ref 0.3–1.2)
Total Protein: 6.4 g/dL — ABNORMAL LOW (ref 6.5–8.1)

## 2021-06-09 LAB — CBC WITH DIFFERENTIAL/PLATELET
Abs Immature Granulocytes: 0.03 10*3/uL (ref 0.00–0.07)
Basophils Absolute: 0.1 10*3/uL (ref 0.0–0.1)
Basophils Relative: 1 %
Eosinophils Absolute: 0 10*3/uL (ref 0.0–0.5)
Eosinophils Relative: 0 %
HCT: 42.4 % (ref 39.0–52.0)
Hemoglobin: 14.2 g/dL (ref 13.0–17.0)
Immature Granulocytes: 1 %
Lymphocytes Relative: 7 %
Lymphs Abs: 0.4 10*3/uL — ABNORMAL LOW (ref 0.7–4.0)
MCH: 27.4 pg (ref 26.0–34.0)
MCHC: 33.5 g/dL (ref 30.0–36.0)
MCV: 81.7 fL (ref 80.0–100.0)
Monocytes Absolute: 0.7 10*3/uL (ref 0.1–1.0)
Monocytes Relative: 12 %
Neutro Abs: 4.7 10*3/uL (ref 1.7–7.7)
Neutrophils Relative %: 79 %
Platelets: 164 10*3/uL (ref 150–400)
RBC: 5.19 MIL/uL (ref 4.22–5.81)
RDW: 15.8 % — ABNORMAL HIGH (ref 11.5–15.5)
WBC: 5.9 10*3/uL (ref 4.0–10.5)
nRBC: 0 % (ref 0.0–0.2)

## 2021-06-09 LAB — GLUCOSE, CAPILLARY
Glucose-Capillary: 96 mg/dL (ref 70–99)
Glucose-Capillary: 130 mg/dL — ABNORMAL HIGH (ref 70–99)

## 2021-06-09 LAB — D-DIMER, QUANTITATIVE: D-Dimer, Quant: 1.02 ug/mL-FEU — ABNORMAL HIGH (ref 0.00–0.50)

## 2021-06-09 LAB — C-REACTIVE PROTEIN: CRP: 15.6 mg/dL — ABNORMAL HIGH (ref <1.0)

## 2021-06-09 LAB — HEMOGLOBIN A1C
Hgb A1c MFr Bld: 5.2 % (ref 4.8–5.6)
Mean Plasma Glucose: 103 mg/dL

## 2021-06-09 LAB — FERRITIN: Ferritin: 152 ng/mL (ref 24–336)

## 2021-06-09 LAB — PROCALCITONIN: Procalcitonin: 0.15 ng/mL

## 2021-06-09 MED ORDER — PHENYLEPHRINE HCL 10 MG PO TABS: 10.0000 mg | ORAL_TABLET | Freq: Four times a day (QID) | ORAL | Status: DC | PRN

## 2021-06-09 MED ORDER — THIAMINE HCL 100 MG PO TABS: 200.0000 mg | ORAL_TABLET | Freq: Every day | ORAL | Status: DC

## 2021-06-09 MED ORDER — GUAIFENESIN-DM 100-10 MG/5ML PO SYRP: 10.0000 mL | ORAL_SOLUTION | Freq: Four times a day (QID) | ORAL | Status: DC

## 2021-06-09 MED ORDER — ENOXAPARIN SODIUM 60 MG/0.6ML IJ SOSY: 0.5000 mg/kg | PREFILLED_SYRINGE | INTRAMUSCULAR | Status: DC

## 2021-06-09 MED ORDER — PAXLOVID (300/100) 20 X 150 MG & 10 X 100MG PO TBPK: 3.0000 | ORAL_TABLET | Freq: Two times a day (BID) | ORAL | 0 refills | Status: AC

## 2021-06-09 MED ORDER — GUAIFENESIN-DM 100-10 MG/5ML PO SYRP: 10.0000 mL | ORAL_SOLUTION | Freq: Three times a day (TID) | ORAL | 0 refills | Status: DC | PRN

## 2021-06-09 MED ORDER — PHENYLEPHRINE HCL 10 MG PO TABS
10.0000 mg | ORAL_TABLET | Freq: Four times a day (QID) | ORAL | Status: DC
  Filled 2021-06-09 (×4): qty 1

## 2021-06-09 NOTE — Progress Notes (Signed)
PROGRESS NOTE  Jeremiah [name].    DOB: 1941-05-05, 81 y.o.  ZOX:[id number]  PCP: Derinda Late, MD   Code Status: Full Code   DOA: 06/08/2021   LOS: 1  Brief Narrative of Current Hospitalization  Jeremiah Mish. is a 81 y.o. male with a PMH significant for CHF, BPH, depression/anxiety, Type 2DM, GERD, HTN, RA, seizure disorder. They presented from home to the ED on 06/08/2021 with AMS x several days. In the ED, it was found that they had fever, tachycardia, tachypnea. He had normal oxygen saturation, LA, and BP. He was diagnosed with COVID-19 and also had some mild signs of vascular congestion on xray/BNP which could also be contributing to his respiratory distress. They were treated with diuretics and remdesivir. Did not receive steroids due to no oxygen requirement.  Patient was admitted to medicine service for further workup and management of acute metabolic encephalopathy 2/2 COVID as outlined in detail below.  06/09/21 -stable, improved  Assessment & Plan  Principal Problem:   Encephalopathy due to COVID-19 virus  Acute metabolic encephalopathy 2/2 COVID-19- resolved. Patient is alert and oriented and eating breakfast and able to joke around with me this morning.  I have low suspicion for UTI based on his lack of symptoms and equivocal urinalysis. Given relative immuno-compromised state, will cautiously watch recovery.  - stop antibiotics.  - continue remdesivir - symptomatic relief for cough/congestion PRN  Microscopic hematuria- chart review reveals that he has had intermittent hematuria for several years.  - follow up with urology outpatient  Mild kidney injury, likely prerenal from acute CHF- resolved. Cr at baseline today - monitor renal functions with diuresis.  Mild acute CHF exacerbation. No echo in chart but showed mildly elevated BNP and vascular congestion on cxray.  - follow up echo - diurese with IV Lasix  Rheumatoid arthritis- chronic, stable - continue  home Plaquenil.   Peripheral neuropathy- chronic, stable -continue his Neurontin.  Seizure disorder. - continue Lamictal and Neurontin.  Type 2 diabetes mellitus in remission. No home medications. Last hgb A1c was 01/2021. New lab is pending.  - discontinue insulin sliding scale and regular CBG checks  DVT prophylaxis: lovenox   Diet:  Diet Orders (From admission, onward)     Start     Ordered   06/08/21 1723  Diet Heart Room service appropriate? Yes; Fluid consistency: Thin  Diet effective now       Question Answer Comment  Room service appropriate? Yes   Fluid consistency: Thin      06/08/21 1724            Subjective 06/09/21    Pt reports feeling much better today. He is slowly getting strength and mental clarity back. He continues to have significant cough and sinus congestion.   Disposition Plan & Communication  Patient status: Inpatient  Admitted From: Home Disposition: Home Anticipated discharge date: 1/21  Family Communication: none  Consults, Procedures, Significant Events  Consultants:  none  Procedures/significant events:  Echo 1/20  Antimicrobials:  Anti-infectives (From admission, onward)    Start     Dose/Rate Route Frequency Ordered Stop   06/09/21 1000  remdesivir 100 mg in sodium chloride 0.9 % 100 mL IVPB       See Hyperspace for full Linked Orders Report.   100 mg 200 mL/hr over 30 Minutes Intravenous Daily 06/08/21 1521 06/13/21 0959   06/09/21 1000  remdesivir 100 mg in sodium chloride 0.9 % 100 mL IVPB  Status:  Discontinued       See Hyperspace for full Linked Orders Report.   100 mg 200 mL/hr over 30 Minutes Intravenous Daily 06/08/21 1724 06/08/21 1752   06/08/21 2200  hydroxychloroquine (PLAQUENIL) tablet 200 mg        200 mg Oral 2 times daily 06/08/21 1724     06/08/21 2000  aztreonam (AZACTAM) 1 g in sodium chloride 0.9 % 100 mL IVPB        1 g 200 mL/hr over 30 Minutes Intravenous Every 8 hours 06/08/21 1853     06/08/21  1730  remdesivir 200 mg in sodium chloride 0.9% 250 mL IVPB  Status:  Discontinued       See Hyperspace for full Linked Orders Report.   200 mg 580 mL/hr over 30 Minutes Intravenous Once 06/08/21 1724 06/08/21 1752   06/08/21 1530  remdesivir 200 mg in sodium chloride 0.9% 250 mL IVPB       See Hyperspace for full Linked Orders Report.   200 mg 580 mL/hr over 30 Minutes Intravenous Once 06/08/21 1521 06/08/21 1715       Objective   Vitals:   06/08/21 1830 06/08/21 1952 06/09/21 0500 06/09/21 0515  BP: 137/73 140/85  139/90  Pulse: 85 93  73  Resp: 12 16  20   Temp:  98.2 F (36.8 C)  97.9 F (36.6 C)  TempSrc:  Oral  Oral  SpO2: 95% 100%  95%  Weight:  108.6 kg 107.8 kg   Height:  6' (1.829 m)      Intake/Output Summary (Last 24 hours) at 06/09/2021 8416 Last data filed at 06/09/2021 0517 Gross per 24 hour  Intake 633.14 ml  Output 850 ml  Net -216.86 ml   Filed Weights   06/08/21 1234 06/08/21 1952 06/09/21 0500  Weight: 131.7 kg 108.6 kg 107.8 kg    Patient BMI: Body mass index is 32.23 kg/m.   Physical Exam:  General: awake, alert, NAD HEENT: bilateral nasal clear congestion. Sinus tenderness to percussion.  Respiratory: normal respiratory effort. CTAB Cardiovascular: normal S1/S2, RRR, no JVD, murmurs, quick capillary refill  Nervous: A&O x3. no gross focal neurologic deficits, normal speech Extremities: moves all equally, no edema, normal tone Skin: dry, intact, normal temperature, normal color. No rashes, lesions or ulcers on exposed skin Psychiatry: normal mood, congruent affect  Labs   I have personally reviewed following labs and imaging studies Admission on 06/08/2021  Component Date Value Ref Range Status   Lactic Acid, Venous 06/08/2021 1.8  0.5 - 1.9 mmol/L Final   Lactic Acid, Venous 06/08/2021 1.8  0.5 - 1.9 mmol/L Final   Sodium 06/08/2021 136  135 - 145 mmol/L Final   Potassium 06/08/2021 4.3  3.5 - 5.1 mmol/L Final   Chloride 06/08/2021 100  98  - 111 mmol/L Final   CO2 06/08/2021 26  22 - 32 mmol/L Final   Glucose, Bld 06/08/2021 83  70 - 99 mg/dL Final   BUN 06/08/2021 18  8 - 23 mg/dL Final   Creatinine, Ser 06/08/2021 1.35 (H)  0.61 - 1.24 mg/dL Final   Calcium 06/08/2021 8.5 (L)  8.9 - 10.3 mg/dL Final   Total Protein 06/08/2021 6.4 (L)  6.5 - 8.1 g/dL Final   Albumin 06/08/2021 3.2 (L)  3.5 - 5.0 g/dL Final   AST 06/08/2021 18  15 - 41 U/L Final   ALT 06/08/2021 12  0 - 44 U/L Final   Alkaline Phosphatase 06/08/2021 90  38 - 126  U/L Final   Total Bilirubin 06/08/2021 0.9  0.3 - 1.2 mg/dL Final   GFR, Estimated 06/08/2021 53 (L)  >60 mL/min Final   Anion gap 06/08/2021 10  5 - 15 Final   WBC 06/08/2021 8.9  4.0 - 10.5 K/uL Final   RBC 06/08/2021 5.09  4.22 - 5.81 MIL/uL Final   Hemoglobin 06/08/2021 14.0  13.0 - 17.0 g/dL Final   HCT 06/08/2021 43.3  39.0 - 52.0 % Final   MCV 06/08/2021 85.1  80.0 - 100.0 fL Final   MCH 06/08/2021 27.5  26.0 - 34.0 pg Final   MCHC 06/08/2021 32.3  30.0 - 36.0 g/dL Final   RDW 06/08/2021 15.7 (H)  11.5 - 15.5 % Final   Platelets 06/08/2021 163  150 - 400 K/uL Final   nRBC 06/08/2021 0.0  0.0 - 0.2 % Final   Neutrophils Relative % 06/08/2021 80  % Final   Neutro Abs 06/08/2021 7.1  1.7 - 7.7 K/uL Final   Lymphocytes Relative 06/08/2021 9  % Final   Lymphs Abs 06/08/2021 0.8  0.7 - 4.0 K/uL Final   Monocytes Relative 06/08/2021 10  % Final   Monocytes Absolute 06/08/2021 0.9  0.1 - 1.0 K/uL Final   Eosinophils Relative 06/08/2021 0  % Final   Eosinophils Absolute 06/08/2021 0.0  0.0 - 0.5 K/uL Final   Basophils Relative 06/08/2021 1  % Final   Basophils Absolute 06/08/2021 0.1  0.0 - 0.1 K/uL Final   Immature Granulocytes 06/08/2021 0  % Final   Abs Immature Granulocytes 06/08/2021 0.03  0.00 - 0.07 K/uL Final   Prothrombin Time 06/08/2021 13.6  11.4 - 15.2 seconds Final   INR 06/08/2021 1.0  0.8 - 1.2 Final   aPTT 06/08/2021 31  24 - 36 seconds Final   Specimen Description  06/08/2021 BLOOD BLOOD RIGHT FOREARM   Final   Special Requests 06/08/2021 BOTTLES DRAWN AEROBIC AND ANAEROBIC Blood Culture adequate volume   Final   Culture 06/08/2021    Final                   Value:NO GROWTH < 24 HOURS Performed at [facility], [address] [place], [place] [address]    Report Status 06/08/2021 PENDING   Incomplete   Specimen Description 06/08/2021 BLOOD RIGHT ANTECUBITAL   Final   Special Requests 06/08/2021 BOTTLES DRAWN AEROBIC AND ANAEROBIC Blood Culture adequate volume   Final   Culture 06/08/2021    Final                   Value:NO GROWTH < 24 HOURS Performed at [facility], [address] [place], [place] [address]    Report Status 06/08/2021 PENDING   Incomplete   Color, Urine 06/08/2021 YELLOW (A)  YELLOW Final   APPearance 06/08/2021 HAZY (A)  CLEAR Final   Specific Gravity, Urine 06/08/2021 1.019  1.005 - 1.030 Final   pH 06/08/2021 7.0  5.0 - 8.0 Final   Glucose, UA 06/08/2021 NEGATIVE  NEGATIVE mg/dL Final   Hgb urine dipstick 06/08/2021 SMALL (A)  NEGATIVE Final   Bilirubin Urine 06/08/2021 NEGATIVE  NEGATIVE Final   Ketones, ur 06/08/2021 20 (A)  NEGATIVE mg/dL Final   Protein, ur 06/08/2021 30 (A)  NEGATIVE mg/dL Final   Nitrite 06/08/2021 NEGATIVE  NEGATIVE Final   Leukocytes,Ua 06/08/2021 LARGE (A)  NEGATIVE Final   RBC / HPF 06/08/2021 6-10  0 - 5 RBC/hpf Final   WBC, UA 06/08/2021  11-20  0 - 5 WBC/hpf Final   Bacteria, UA 06/08/2021 NONE SEEN  NONE SEEN Final   Squamous Epithelial / LPF 06/08/2021 0-5  0 - 5 Final   Mucus 06/08/2021 PRESENT   Final   Procalcitonin 06/08/2021 0.14  ng/mL Final   SARS Coronavirus 2 by RT PCR 06/08/2021 POSITIVE (A)  NEGATIVE Final   Influenza A by PCR 06/08/2021 NEGATIVE  NEGATIVE Final   Influenza B by PCR 06/08/2021 NEGATIVE  NEGATIVE Final   LDH 06/08/2021 132  98 - 192 U/L Final   Ferritin 06/08/2021 113  24 - 336 ng/mL Final   Procalcitonin 06/08/2021 0.12  ng/mL Final    Troponin I (High Sensitivity) 06/08/2021 25 (H)  <18 ng/L Final   CRP 06/08/2021 10.3 (H)  <1.0 mg/dL Final   B Natriuretic Peptide 06/08/2021 123.4 (H)  0.0 - 100.0 pg/mL Final   D-Dimer, Quant 06/08/2021 1.44 (H)  0.00 - 0.50 ug/mL-FEU Final   Fibrinogen 06/08/2021 487 (H)  210 - 475 mg/dL Final   Troponin I (High Sensitivity) 06/08/2021 20 (H)  <18 ng/L Final   Glucose-Capillary 06/08/2021 106 (H)  70 - 99 mg/dL Final    Imaging Studies  CT HEAD WO CONTRAST (5MM)  Result Date: 06/08/2021 CLINICAL DATA:  Mental status change. EXAM: CT HEAD WITHOUT CONTRAST TECHNIQUE: Contiguous axial images were obtained from the base of the skull through the vertex without intravenous contrast. RADIATION DOSE REDUCTION: This exam was performed according to the departmental dose-optimization program which includes automated exposure control, adjustment of the mA and/or kV according to patient size and/or use of iterative reconstruction technique. COMPARISON:  CT head dated August 22, 2019 FINDINGS: Brain: No evidence of acute infarction, hemorrhage, hydrocephalus, extra-axial collection or mass lesion/mass effect. Low-attenuation of the periventricular white matter presumed chronic microvascular ischemic changes. Mild cerebral volume loss. Vascular: No hyperdense vessel or unexpected calcification. Skull: Normal. Negative for fracture or focal lesion. Sinuses/Orbits: Patchy opacification of ethmoid air cells. Remaining paranasal sinuses and mastoid air cells are clear. Other: None. IMPRESSION: 1. No acute intracranial abnormality. 2. Mild cerebral atrophy and chronic microvascular ischemic changes of the white matter, unchanged. Electronically Signed   By: Keane Police D.O.   On: 06/08/2021 14:01   DG Chest Port 1 View  Result Date: 06/08/2021 CLINICAL DATA:  Possible sepsis. EXAM: PORTABLE CHEST 1 VIEW COMPARISON:  05/04/2021 FINDINGS: 1304 hours. Low lung volumes. Cardiopericardial silhouette is at upper limits of  normal for size. There is pulmonary vascular congestion without overt pulmonary edema. The lungs are clear without focal pneumonia, edema, pneumothorax or pleural effusion. Telemetry leads overlie the chest. IMPRESSION: Low volume chest film with vascular congestion. Electronically Signed   By: Misty Stanley M.D.   On: 06/08/2021 13:22    Medications   Scheduled Meds:  vitamin C  500 mg Oral Daily   aspirin  81 mg Oral Daily   cholecalciferol  1,000 Units Oral Daily   enoxaparin (LOVENOX) injection  0.5 mg/kg Subcutaneous Q24H   ferrous sulfate  325 mg Oral Q breakfast   folic acid  1 mg Oral Daily   furosemide  40 mg Intravenous BID   gabapentin  900 mg Oral BID   hydroxychloroquine  200 mg Oral BID   insulin aspart  0-9 Units Subcutaneous TID AC & HS   lamoTRIgine  200 mg Oral BID   lamoTRIgine  25 mg Oral BID   leflunomide  10 mg Oral Daily   magnesium oxide  400  mg Oral Daily   methylPREDNISolone (SOLU-MEDROL) injection  40 mg Intravenous Q12H   metoprolol tartrate  25 mg Oral BID   pantoprazole  40 mg Oral Daily   thiamine  200 mg Oral Daily   traZODone  50 mg Oral QHS   zinc sulfate  220 mg Oral Daily   No recently discontinued medications to reconcile  LOS: 1 day   Richarda Osmond, DO [facility] 06/09/2021, 7:12 AM   Available by Epic secure chat 7AM-7PM. If 7PM-7AM, please contact night-coverage Refer to [email or web address] to contact the [facility] Attending or Consulting provider for this pt

## 2021-06-09 NOTE — Care Management CC44 (Signed)
Condition Code 44 Documentation Completed  Patient Details  Name: Jeremiah [name]. MRN: [id number] Date of Birth: January 17, 1941   Condition Code 44 given:  Yes  Patient signature on Condition Code 44 notice:  yes   Documentation of 2 MD's agreement:  Yes  Code 44 added to claim:  Yes     Pete Pelt, RN 06/09/2021, 2:02 PM

## 2021-06-09 NOTE — Progress Notes (Signed)
*  PRELIMINARY RESULTS* Echocardiogram 2D Echocardiogram has been performed.  [name] 06/09/2021, 1:25 PM

## 2021-06-09 NOTE — Progress Notes (Signed)
Patient discharged to home with son. Both IVs removed and discharge instructions and education given to patient and son, all questions answered.

## 2021-06-09 NOTE — Discharge Summary (Signed)
Physician Discharge Summary  Jeremiah [name]. IOE:[id number] DOB: 11-16-1940 DOA: 06/08/2021  PCP: Derinda Late, MD  Admit date: 06/08/2021 Discharge date: 06/09/2021  Admitted From: Home Disposition: Home  Recommendations for Outpatient Follow-up:  Follow up with PCP within 1-2 weeks to monitor electrolytes and kidney function Patient had pending echo on discharge to evaluate heart failure- was on his home diuretic and no change in his baseline fluid status so don't suspect acute change in status but if hasn't had recent echo, could consider a repeat by PCP at [facility]  Discharge Condition:stable, improved CODE STATUS:  Code Status: Full Code  Regular healthy diet  Brief/Interim Summary: Pt presented from home to the ED on 06/08/2021 with AMS x several days. In the ED, it was found that they had fever, tachycardia, tachypnea. He had normal oxygen saturation, LA, and BP. He was diagnosed with COVID-19 and also had some mild signs of vascular congestion on xray/BNP which could also be contributing to his respiratory distress. Fortunately, he did not have an oxygen requirement.  They were treated with diuretics and remdesivir. Did not receive steroids due to no oxygen requirement. He had rapid recovery to his baseline status with the exception of mild upper respiratory symptoms.   Paxlovid was continued at discharge.  All other chronic conditions were treated with home medications.   Discharge Diagnoses:  Principal Problem:   Encephalopathy due to COVID-19 virus Active Problems:   [id number]   Metabolic encephalopathy   Microscopic hematuria   Allergies as of 06/09/2021       Reactions   Cefoperazone Anaphylaxis   Penicillins Anaphylaxis, Other (See Comments)   Has patient had a PCN reaction causing immediate rash, facial/tongue/throat swelling, SOB or lightheadedness with hypotension: Yes Has patient had a PCN reaction causing severe rash involving mucus membranes or skin necrosis:  No Has patient had a PCN reaction that required hospitalization No Has patient had a PCN reaction occurring within the last 10 years: No If all of the above answers are "NO", then may proceed with Cephalosporin use.   Latex Rash        Medication List     STOP taking these medications    ascorbic acid 500 MG tablet Commonly known as: VITAMIN C   ENBREL [place]   feeding supplement Liqd   nystatin 100000 UNIT/ML suspension Commonly known as: MYCOSTATIN   predniSONE 5 MG tablet Commonly known as: DELTASONE   sulfamethoxazole-trimethoprim 800-160 MG tablet Commonly known as: Bactrim DS   zinc sulfate 220 (50 Zn) MG capsule       TAKE these medications    acetaminophen 325 MG tablet Commonly known as: TYLENOL Take 2 tablets (650 mg total) by mouth every 6 (six) hours as needed for mild pain (or Fever >/= 101).   aspirin 81 MG chewable tablet Chew 81 mg by mouth daily.   ferrous sulfate 325 (65 FE) MG tablet Take 325 mg by mouth daily with breakfast.   folic acid 1 MG tablet Commonly known as: FOLVITE Take 1 tablet (1 mg total) by mouth daily.   furosemide 40 MG tablet Commonly known as: LASIX Take 40 mg by mouth daily.   gabapentin 300 MG capsule Commonly known as: NEURONTIN Take 900 mg by mouth 2 (two) times daily.   guaiFENesin-dextromethorphan 100-10 MG/5ML syrup Commonly known as: ROBITUSSIN DM Take 10 mLs by mouth every 8 (eight) hours as needed for cough.   hydroxychloroquine 200 MG tablet Commonly known as: PLAQUENIL Take 200 mg by  mouth 2 (two) times daily.   lamoTRIgine 200 MG tablet Commonly known as: LAMICTAL Take 200 mg by mouth 2 (two) times daily.   lamoTRIgine 25 MG tablet Commonly known as: LAMICTAL Take 25 mg by mouth 2 (two) times daily.   leflunomide 10 MG tablet Commonly known as: ARAVA Take 10 mg by mouth daily.   magnesium oxide 400 MG tablet Commonly known as: MAG-OX Take 400 mg by mouth daily.   metoprolol tartrate 25  MG tablet Commonly known as: LOPRESSOR Take 1 tablet (25 mg total) by mouth 2 (two) times daily.   omeprazole 40 MG capsule Commonly known as: PRILOSEC Take 40 mg by mouth daily as needed.   Paxlovid (300/100) 20 x 150 MG & 10 x 100MG  Tbpk Generic drug: nirmatrelvir & ritonavir Take 3 tablets by mouth in the morning and at bedtime for 5 days.   phenylephrine 10 MG Tabs tablet Commonly known as: SUDAFED PE Take 1 tablet (10 mg total) by mouth every 6 (six) hours as needed.   polyethylene glycol 17 g packet Commonly known as: MIRALAX / GLYCOLAX Take 17 g by mouth daily as needed.   thiamine 100 MG tablet Take 2 tablets (200 mg total) by mouth daily.   traZODone 50 MG tablet Commonly known as: DESYREL Take 50 mg by mouth at bedtime.   Vitamin D3 25 MCG tablet Commonly known as: Vitamin D Take 1 tablet (1,000 Units total) by mouth daily.        Allergies  Allergen Reactions   Cefoperazone Anaphylaxis   Penicillins Anaphylaxis and Other (See Comments)    Has patient had a PCN reaction causing immediate rash, facial/tongue/throat swelling, SOB or lightheadedness with hypotension: Yes Has patient had a PCN reaction causing severe rash involving mucus membranes or skin necrosis: No Has patient had a PCN reaction that required hospitalization No Has patient had a PCN reaction occurring within the last 10 years: No If all of the above answers are "NO", then may proceed with Cephalosporin use.   Latex Rash    Consultations: None   Procedures/Studies: CT HEAD WO CONTRAST (5MM)  Result Date: 06/08/2021 CLINICAL DATA:  Mental status change. EXAM: CT HEAD WITHOUT CONTRAST TECHNIQUE: Contiguous axial images were obtained from the base of the skull through the vertex without intravenous contrast. RADIATION DOSE REDUCTION: This exam was performed according to the departmental dose-optimization program which includes automated exposure control, adjustment of the mA and/or kV  according to patient size and/or use of iterative reconstruction technique. COMPARISON:  CT head dated August 22, 2019 FINDINGS: Brain: No evidence of acute infarction, hemorrhage, hydrocephalus, extra-axial collection or mass lesion/mass effect. Low-attenuation of the periventricular white matter presumed chronic microvascular ischemic changes. Mild cerebral volume loss. Vascular: No hyperdense vessel or unexpected calcification. Skull: Normal. Negative for fracture or focal lesion. Sinuses/Orbits: Patchy opacification of ethmoid air cells. Remaining paranasal sinuses and mastoid air cells are clear. Other: None. IMPRESSION: 1. No acute intracranial abnormality. 2. Mild cerebral atrophy and chronic microvascular ischemic changes of the white matter, unchanged. Electronically Signed   By: Keane Police D.O.   On: 06/08/2021 14:01   DG Chest Port 1 View  Result Date: 06/08/2021 CLINICAL DATA:  Possible sepsis. EXAM: PORTABLE CHEST 1 VIEW COMPARISON:  05/04/2021 FINDINGS: 1304 hours. Low lung volumes. Cardiopericardial silhouette is at upper limits of normal for size. There is pulmonary vascular congestion without overt pulmonary edema. The lungs are clear without focal pneumonia, edema, pneumothorax or pleural effusion. Telemetry leads overlie  the chest. IMPRESSION: Low volume chest film with vascular congestion. Electronically Signed   By: Misty Stanley M.D.   On: 06/08/2021 13:22    Subjective: Patient feels overall well. He is [facility] at the [facility] for his primary care. Endorses some rhinorrhea and sinus congestion. Denies chest pain or SOB. At baseline, he can stand and transfer to commode with assistance and has good home support. Endorses chronic LE edema.  Discharge Exam: Vitals:   06/09/21 0808 06/09/21 1208  BP: 136/74 126/79  Pulse: 76 69  Resp: 17 16  Temp: 97.7 F (36.5 C) (!) 97.5 F (36.4 C)  SpO2: 97% 96%    General: Pt is alert, awake, not in acute distress Cardiovascular: RRR, S1/S2 +, no  rubs, no gallops Respiratory: CTA bilaterally, no wheezing, no rhonchi Abdominal: Soft, NT, ND, bowel sounds + Extremities: chronic venous stasis edema- symmetrical, no cyanosis  Labs: Basic Metabolic Panel: Recent Labs  Lab 06/08/21 1237 06/09/21 0701  NA 136 138  K 4.3 3.7  CL 100 104  CO2 26 26  GLUCOSE 83 99  BUN 18 20  CREATININE 1.35* 0.97  CALCIUM 8.5* 8.4*   CBC: Recent Labs  Lab 06/08/21 1237 06/09/21 0701  WBC 8.9 5.9  NEUTROABS 7.1 4.7  HGB 14.0 14.2  HCT 43.3 42.4  MCV 85.1 81.7  PLT 163 164    Microbiology Recent Results (from the past 240 hour(s))  Blood Culture (routine x 2)     Status: None (Preliminary result)   Collection Time: 06/08/21 12:37 PM   Specimen: BLOOD  Result Value Ref Range Status   Specimen Description BLOOD BLOOD RIGHT FOREARM  Final   Special Requests   Final    BOTTLES DRAWN AEROBIC AND ANAEROBIC Blood Culture adequate volume   Culture   Final    NO GROWTH < 24 HOURS Performed at [facility], [address] [place], [place] [address]    Report Status PENDING  Incomplete  Blood Culture (routine x 2)     Status: None (Preliminary result)   Collection Time: 06/08/21 12:37 PM   Specimen: BLOOD  Result Value Ref Range Status   Specimen Description BLOOD RIGHT ANTECUBITAL  Final   Special Requests   Final    BOTTLES DRAWN AEROBIC AND ANAEROBIC Blood Culture adequate volume   Culture   Final    NO GROWTH < 24 HOURS Performed at [facility], [address] [place], [place] [address]    Report Status PENDING  Incomplete  Resp Panel by RT-PCR (Flu A&B, Covid) Nasopharyngeal Swab     Status: Abnormal   Collection Time: 06/08/21 12:37 PM   Specimen: Nasopharyngeal Swab; Nasopharyngeal(NP) swabs in vial transport medium  Result Value Ref Range Status   SARS Coronavirus 2 by RT PCR POSITIVE (A) NEGATIVE Final    Comment: (NOTE) SARS-CoV-2 target nucleic acids are DETECTED.  The SARS-CoV-2 RNA is generally  detectable in upper respiratory specimens during the acute phase of infection. Positive results are indicative of the presence of the identified virus, but do not rule out bacterial infection or co-infection with other pathogens not detected by the test. Clinical correlation with patient history and other diagnostic information is necessary to determine patient infection status. The expected result is Negative.  Fact Sheet for Patients: EntrepreneurPulse.com.au  Fact Sheet for Healthcare Providers: IncredibleEmployment.be  This test is not yet approved or cleared by the [place] FDA and  has been authorized for detection and/or diagnosis of SARS-CoV-2 by  FDA under an Emergency Use Authorization (EUA).  This EUA will remain in effect (meaning this test can be used) for the duration of  the COVID-19 declaration under Section 564(b)(1) of the A ct, 21 U.S.C. section 360bbb-3(b)(1), unless the authorization is terminated or revoked sooner.     Influenza A by PCR NEGATIVE NEGATIVE Final   Influenza B by PCR NEGATIVE NEGATIVE Final    Comment: (NOTE) The Xpert Xpress SARS-CoV-2/FLU/RSV plus assay is intended as an aid in the diagnosis of influenza from Nasopharyngeal swab specimens and should not be used as a sole basis for treatment. Nasal washings and aspirates are unacceptable for Xpert Xpress SARS-CoV-2/FLU/RSV testing.  Fact Sheet for Patients: EntrepreneurPulse.com.au  Fact Sheet for Healthcare Providers: IncredibleEmployment.be  This test is not yet approved or cleared by the [place] FDA and has been authorized for detection and/or diagnosis of SARS-CoV-2 by FDA under an Emergency Use Authorization (EUA). This EUA will remain in effect (meaning this test can be used) for the duration of the COVID-19 declaration under Section 564(b)(1) of the Act, 21 U.S.C. section 360bbb-3(b)(1), unless the  authorization is terminated or revoked.  Performed at [facility], [address], [place], [place] [address]     Time coordinating discharge: Over 30 minutes  Richarda Osmond, MD  [facility] 06/09/2021, 12:12 PM

## 2021-06-10 LAB — URINE CULTURE

## 2021-06-13 LAB — CULTURE, BLOOD (ROUTINE X 2)
Culture: NO GROWTH
Culture: NO GROWTH
Special Requests: ADEQUATE
Special Requests: ADEQUATE

## 2021-06-28 ENCOUNTER — Encounter: Payer: Self-pay | Admitting: *Deleted

## 2021-07-04 ENCOUNTER — Inpatient Hospital Stay: Payer: Medicare PPO | Attending: Oncology

## 2021-07-04 ENCOUNTER — Encounter: Payer: Self-pay | Admitting: Oncology

## 2021-07-04 ENCOUNTER — Inpatient Hospital Stay: Payer: Medicare PPO | Admitting: Oncology

## 2021-07-04 ENCOUNTER — Other Ambulatory Visit: Payer: Self-pay

## 2021-07-04 VITALS — BP 121/81 | HR 98 | Temp 97.4°F | Wt 266.3 lb

## 2021-07-04 DIAGNOSIS — M069 Rheumatoid arthritis, unspecified: Secondary | ICD-10-CM | POA: Insufficient documentation

## 2021-07-04 DIAGNOSIS — D72820 Lymphocytosis (symptomatic): Secondary | ICD-10-CM

## 2021-07-04 DIAGNOSIS — I110 Hypertensive heart disease with heart failure: Secondary | ICD-10-CM | POA: Insufficient documentation

## 2021-07-04 DIAGNOSIS — D509 Iron deficiency anemia, unspecified: Secondary | ICD-10-CM | POA: Diagnosis not present

## 2021-07-04 DIAGNOSIS — E538 Deficiency of other specified B group vitamins: Secondary | ICD-10-CM | POA: Insufficient documentation

## 2021-07-04 DIAGNOSIS — I509 Heart failure, unspecified: Secondary | ICD-10-CM | POA: Insufficient documentation

## 2021-07-04 DIAGNOSIS — E119 Type 2 diabetes mellitus without complications: Secondary | ICD-10-CM | POA: Diagnosis not present

## 2021-07-04 LAB — CBC WITH DIFFERENTIAL/PLATELET
Abs Immature Granulocytes: 0.03 10*3/uL (ref 0.00–0.07)
Basophils Absolute: 0.1 10*3/uL (ref 0.0–0.1)
Basophils Relative: 1 %
Eosinophils Absolute: 0.2 10*3/uL (ref 0.0–0.5)
Eosinophils Relative: 3 %
HCT: 44.3 % (ref 39.0–52.0)
Hemoglobin: 13.8 g/dL (ref 13.0–17.0)
Immature Granulocytes: 1 %
Lymphocytes Relative: 20 %
Lymphs Abs: 1.3 10*3/uL (ref 0.7–4.0)
MCH: 27.3 pg (ref 26.0–34.0)
MCHC: 31.2 g/dL (ref 30.0–36.0)
MCV: 87.5 fL (ref 80.0–100.0)
Monocytes Absolute: 0.6 10*3/uL (ref 0.1–1.0)
Monocytes Relative: 10 %
Neutro Abs: 4.1 10*3/uL (ref 1.7–7.7)
Neutrophils Relative %: 65 %
Platelets: 208 10*3/uL (ref 150–400)
RBC: 5.06 MIL/uL (ref 4.22–5.81)
RDW: 16.3 % — ABNORMAL HIGH (ref 11.5–15.5)
WBC: 6.3 10*3/uL (ref 4.0–10.5)
nRBC: 0 % (ref 0.0–0.2)

## 2021-07-04 LAB — IRON AND TIBC
Iron: 109 ug/dL (ref 45–182)
Saturation Ratios: 34 % (ref 17.9–39.5)
TIBC: 325 ug/dL (ref 250–450)
UIBC: 216 ug/dL

## 2021-07-04 LAB — FERRITIN: Ferritin: 53 ng/mL (ref 24–336)

## 2021-07-04 LAB — LACTATE DEHYDROGENASE: LDH: 161 U/L (ref 98–192)

## 2021-07-04 LAB — FOLATE: Folate: 24 ng/mL (ref >5.9)

## 2021-07-04 NOTE — Progress Notes (Signed)
Hematology/Oncology Progress note [phone] [phone]      Clinic Day:  07/04/2021  Referring physician: Derinda Late, MD  Chief Complaint: Jeremiah [name]. is a 81 y.o. male presents for follow up for history of pancytopenia, folate deficiency, and a monoclonal B cell lymphocytosis   PERTINENT HEMATOLOGY HISTORY  Patient previously followed up by Dr.Corcoran, patient switched care to me on 07/04/21 Extensive medical record review was performed by me  # rheumatoid arthritis and a history of pancytopenia previously on methotrexate, then Enbrel and now currently on leflunomide, Plaquenil.  #History of folic acid deficiency takes folic acid supplements.  Previously has a history of pancytopenia secondary to methotrexate.  Received G-CSF support.  #Monoclonal B-cell lymphocytosis, Peripheral blood flow cytometry on 04/09/2020 revealed  1% myeloblasts in the setting of pancytopenia and phenotypic aberrancies of neutrophils and monocytes.  There was a CD5-, CD10- clonal B cell population, non specific phenotype, 2% of leukocytes (< 5000/uL) c/w a monoclonal B-cell lymphocytosis.   Bone marrow on 04/10/2019 revealed a hypercellular marrow with erythroid hyperplasia, rare ringed sideroblasts and dyspoiesis.  There was polytic plasmacytosis (6-10%).  The plasma cells coexpressed CD56 (unusual) but appeared polytypic by kappa and lambda in situ hybridization.  There were no increased blasts.  Cytogenetics were normal (17, XX).  Bone marrow findings were felt likely related to methotrexate or possibly a myelodysplastic syndrome (MDS).   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  MYD88  L265P can be seen in lymphoplasmacytic lymphoma/Wldenstrom's macroglobulinemia, IgM MGUS, CLL, marginal zone lymphoma, splenic lymphoma, and some non-germinal center subtype diffuse large B celll lymphomas.   Flow cytometry  on 07/09/2019 revealed a CD5, CD10, and CD103 negative monoclonal B cell population , non specific phenotype, <1% of leukocytes, < 5,000/uL.  This represents a monoclonal B-cell lymphocytosis.  This is not the typical phenotype for CLL, hairy cell leukemia, or follicular center cell lymphoma.  SPEP on 07/09/2019 and 05/26/2020 revealed no monoclonal protein; immunoglobulins were normal on 07/09/2019.  #Iron deficiency He has a history of iron deficiency.  RBC indices were microcytic on 01/21/2020.  Ferritin was 52 with an iron saturation of 7% (low).  Colonoscopy on 04/27/2020 revealed stool in the sigmoid colon. There were non-bleeding internal hemorrhoids. There was a 4 mm polyp in the transverse colon (tubular adenoma). EGD on 04/27/2020 was normal.  He follows up with rheumatology.  He is currently on prednisone and leflunomide He was admitted due to [place] in January 2023.  Today he feels well.    Past Medical History:  Diagnosis Date   Allergy    Anemia    Anxiety    Arthritis    CHF (congestive heart failure) (HCC)    Collagen vascular disease ([place])    Depression    Diabetes mellitus without complication (HCC)    GERD (gastroesophageal reflux disease)    Hypertension    Rheumatoid arthritis ([place])    Seizures ([place])    Sepsis ([place]) 05/23/2015    Past Surgical History:  Procedure Laterality Date   BACK SURGERY     back surgery five times  04/2016   CARPAL TUNNEL RELEASE     COLONOSCOPY WITH PROPOFOL N/A 04/27/2020   Procedure: COLONOSCOPY WITH PROPOFOL;  Surgeon: Toledo, Benay Pike, MD;  Location: [facility] ENDOSCOPY;  Service: Gastroenterology;  Laterality: N/A;   ESOPHAGOGASTRODUODENOSCOPY (EGD) WITH PROPOFOL N/A 04/27/2020   Procedure: ESOPHAGOGASTRODUODENOSCOPY (EGD) WITH PROPOFOL;  Surgeon: [place], [place],  MD;  Location: [facility] ENDOSCOPY;  Service: Gastroenterology;  Laterality: N/A;   HERNIA REPAIR     JOINT REPLACEMENT     knee replacement right     PEG PLACEMENT N/A  04/24/2019   Procedure: PERCUTANEOUS ENDOSCOPIC GASTROSTOMY (PEG) PLACEMENT;  Surgeon: Toledo, Benay Pike, MD;  Location: [facility] ENDOSCOPY;  Service: Gastroenterology;  Laterality: N/A;   right hip replacement     rotator cuff surgery     SPINE SURGERY      Family History  Problem Relation Age of Onset   Hypertension Mother    Dementia Mother    Dementia Father    Hypertension Other    Prostate cancer Neg Hx    Bladder Cancer Neg Hx    Kidney cancer Neg Hx     Social History:  reports that he has quit smoking. His smoking use included cigarettes. He has a 20.00 pack-year smoking history. He has never used smokeless tobacco. He reports that he does not drink alcohol and does not use drugs.  He denies any exposure to radiation or toxins.  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  He lives in [place].  His wife's name is [name]. The patient is accompanied by his wife, [name], today.   Allergies:  Allergies  Allergen Reactions   Cefoperazone Anaphylaxis   Penicillins Anaphylaxis and Other (See Comments)    Has patient had a PCN reaction causing immediate rash, facial/tongue/throat swelling, SOB or lightheadedness with hypotension: Yes Has patient had a PCN reaction causing severe rash involving mucus membranes or skin necrosis: No Has patient had a PCN reaction that required hospitalization No Has patient had a PCN reaction occurring within the last 10 years: No If all of the above answers are "NO", then may proceed with Cephalosporin use.   Latex Rash    Current Medications: Current Outpatient Medications  Medication Sig Dispense Refill   aspirin 81 MG chewable tablet Chew 81 mg by mouth daily.     cholecalciferol (VITAMIN D) 25 MCG tablet Take 1 tablet (1,000 Units total) by mouth daily. 30 tablet 0   ferrous sulfate 325 (65 FE) MG tablet Take 325 mg by mouth daily with breakfast.     furosemide  (LASIX) 40 MG tablet Take 40 mg by mouth daily.      gabapentin (NEURONTIN) 300 MG capsule Take 900 mg by mouth 2 (two) times daily.     guaiFENesin-dextromethorphan (ROBITUSSIN DM) 100-10 MG/5ML syrup Take 10 mLs by mouth every 8 (eight) hours as needed for cough. 118 mL 0   hydroxychloroquine (PLAQUENIL) 200 MG tablet Take 200 mg by mouth 2 (two) times daily.     lamoTRIgine (LAMICTAL) 200 MG tablet Take 200 mg by mouth 2 (two) times daily.     lamoTRIgine (LAMICTAL) 25 MG tablet Take 25 mg by mouth 2 (two) times daily.     leflunomide (ARAVA) 10 MG tablet Take 10 mg by mouth daily.     magnesium oxide (MAG-OX) 400 MG tablet Take 400 mg by mouth daily.     omeprazole (PRILOSEC) 40 MG capsule Take 40 mg by mouth daily as needed.      phenylephrine (SUDAFED PE) 10 MG TABS tablet Take 1 tablet (10 mg total) by mouth every 6 (six) hours as needed. 42 tablet    polyethylene glycol (MIRALAX / GLYCOLAX) 17 g packet Take 17 g by mouth daily  as needed. 14 each 0   thiamine 100 MG tablet Take 2 tablets (200 mg total) by mouth daily.     traZODone (DESYREL) 50 MG tablet Take 50 mg by mouth at bedtime.     acetaminophen (TYLENOL) 325 MG tablet Take 2 tablets (650 mg total) by mouth every 6 (six) hours as needed for mild pain (or Fever >/= 101). (Patient not taking: Reported on 01/26/9891)     folic acid (FOLVITE) 1 MG tablet Take 1 tablet (1 mg total) by mouth daily. (Patient not taking: Reported on 07/04/2021) 30 tablet 1   metoprolol tartrate (LOPRESSOR) 25 MG tablet Take 1 tablet (25 mg total) by mouth 2 (two) times daily. 60 tablet 11   No current facility-administered medications for this visit.    Review of Systems  Constitutional:  Negative for chills, diaphoresis, fever, malaise/fatigue and weight loss.       Feels well.   HENT: Negative.  Negative for congestion, ear discharge, ear pain, hearing loss, nosebleeds, sinus pain, sore throat and tinnitus.   Eyes: Negative.  Negative for blurred  vision.  Respiratory: Negative.  Negative for cough, hemoptysis, sputum production and shortness of breath.   Cardiovascular:  Positive for leg swelling. Negative for chest pain and palpitations.  Gastrointestinal:  Positive for constipation (occasional). Negative for abdominal pain, blood in stool, diarrhea, heartburn, melena, nausea and vomiting.       Appetite is fair.   Genitourinary: Negative.  Negative for dysuria, frequency, hematuria and urgency.  Musculoskeletal:  Positive for joint pain (rheumatoid and osteoarthritis). Negative for back pain, falls, myalgias and neck pain.  Skin: Negative.  Negative for itching and rash.  Neurological:  Negative for dizziness, tingling, sensory change, weakness and headaches.       Uses a walker all the time. Strength continues to improve.  Endo/Heme/Allergies: Negative.  Does not bruise/bleed easily.  Psychiatric/Behavioral: Negative.  Negative for depression and memory loss. The patient is not nervous/anxious and does not have insomnia.   All other systems reviewed and are negative.  Performance status (ECOG):  2 Vitals Blood pressure 121/81, pulse 98, temperature (!) 97.4 F (36.3 C), temperature source Tympanic, weight 266 lb 4.8 oz (120.8 kg).   Physical Exam Vitals and nursing note reviewed.  Constitutional:      General: He is not in acute distress.    Appearance: He is well-developed.     Comments: Patient was examined in a wheelchair.  HENT:     Head: Normocephalic and atraumatic.  Eyes:     General: No scleral icterus.    Pupils: Pupils are equal, round, and reactive to light.  Cardiovascular:     Rate and Rhythm: Normal rate and regular rhythm.     Heart sounds: Normal heart sounds. No murmur heard. Pulmonary:     Effort: Pulmonary effort is normal. No respiratory distress.     Breath sounds: Normal breath sounds. No wheezing or rales.  Chest:     Chest wall: No tenderness.  Abdominal:     General: Bowel sounds are normal.  There is no distension.     Palpations: Abdomen is soft. There is no hepatomegaly or splenomegaly.  Musculoskeletal:        General: No tenderness. Normal range of motion.     Cervical back: Normal range of motion.     Right lower leg: Edema (3+ ankle) present.     Left lower leg: Edema (3+ ankle) present.  Lymphadenopathy:     Head:  Right side of head: No preauricular, posterior auricular or occipital adenopathy.     Left side of head: No preauricular, posterior auricular or occipital adenopathy.     Cervical: No cervical adenopathy.     Upper Body:     Right upper body: No supraclavicular or axillary adenopathy.     Left upper body: No supraclavicular or axillary adenopathy.     Lower Body: No right inguinal adenopathy. No left inguinal adenopathy.  Skin:    General: Skin is warm and dry.     Comments: Chronic changes in legs  Neurological:     Mental Status: He is alert and oriented to person, place, and time.  Psychiatric:        Mood and Affect: Mood normal.   No visits with results within 3 Day(s) from this visit.  Latest known visit with results is:  Admission on 04/27/2020, Discharged on 04/27/2020  Component Date Value Ref Range Status   SURGICAL PATHOLOGY 04/27/2020    Final-Edited                   Value:SURGICAL PATHOLOGY CASE: [id number] PATIENT: [facility] Surgical Pathology Report  Specimen Submitted: A. Colon polyp, transverse; cbx  Clinical History: IDA, rectal bleeding, weight loss.  Normal EGD, internal hemorrhoids, colon polyp.  DIAGNOSIS: A.  COLON POLYP, TRANSVERSE; COLD BIOPSY: - TUBULAR ADENOMA. - NEGATIVE FOR HIGH-GRADE DYSPLASIA AND MALIGNANCY.  GROSS DESCRIPTION: A. Labeled: Transverse colon polyp cbx Received: Formalin Tissue fragment(s): 1 Size: 0.4 cm Description: Tan soft tissue fragment Entirely submitted in 1 cassette.  Final Diagnosis performed by Quay Burow, MD.   Electronically signed 04/28/2020 11:51:46AM The  electronic signature indicates that the named Attending Pathologist has evaluated the specimen Technical component performed at [facility], [address] [place], [place] [address] Lab: [phone] Dir: Rush Farmer, MD, MMM  Professional component performed at [place], [facility],                          [place], [place],  [address] Lab: [phone] Dir: Dellia Nims. Rubinas, MD    Assessment and Plan:   1. Iron deficiency anemia, unspecified iron deficiency anemia type   2. Monoclonal B-cell lymphocytosis   3. Folate deficiency    # Monoclonal B-cell lymphocytosis Labs are reviewed and discussed with patient. Wbc has been stable. He has no constitutional symptoms.  Continue observation.   # Iron deficiency  Continue oral ferrous sulfate 370m daily for maintenance.   # Folic acid deficiency, folate level is normal.  Off metrotrexate. Can stop folic acid.   RTC in 1 year for MD assessment, labs 1 week prior.  I discussed the assessment and treatment plan with the patient.  The patient was provided an opportunity to ask questions and all were answered.  The patient agreed with the plan and demonstrated an understanding of the instructions.  The patient was advised to call back if the symptoms worsen or if the condition fails to improve as anticipated.  ZEarlie Server MD, PhD [facility] Hematology Oncology 07/04/2021

## 2021-11-30 ENCOUNTER — Encounter: Payer: Self-pay | Admitting: Urology

## 2021-11-30 ENCOUNTER — Ambulatory Visit: Payer: Medicare PPO | Admitting: Urology

## 2021-11-30 VITALS — BP 107/73 | HR 111 | Ht 72.0 in | Wt 265.0 lb

## 2021-11-30 DIAGNOSIS — R339 Retention of urine, unspecified: Secondary | ICD-10-CM

## 2021-11-30 DIAGNOSIS — N401 Enlarged prostate with lower urinary tract symptoms: Secondary | ICD-10-CM | POA: Diagnosis not present

## 2021-11-30 LAB — URINALYSIS, COMPLETE
Bilirubin, UA: NEGATIVE
Glucose, UA: NEGATIVE
Leukocytes,UA: NEGATIVE
Nitrite, UA: NEGATIVE
RBC, UA: NEGATIVE
Specific Gravity, UA: >1.03 — ABNORMAL HIGH (ref 1.005–1.030)
Urobilinogen, Ur: 1 mg/dL (ref 0.2–1.0)
pH, UA: 5.5 (ref 5.0–7.5)

## 2021-11-30 LAB — MICROSCOPIC EXAMINATION

## 2021-11-30 LAB — BLADDER SCAN AMB NON-IMAGING: Scan Result: 0

## 2021-11-30 MED ORDER — SILODOSIN 8 MG PO CAPS: 8.0000 mg | ORAL_CAPSULE | Freq: Every day | ORAL | 3 refills | Status: DC

## 2021-11-30 NOTE — Progress Notes (Signed)
11/30/2021 4:27 PM   Jeremiah [name] 1940/09/22 [id number]  Referring provider: Derinda Late, MD [phone] [facility] [place],  [place] [address]  Chief Complaint  Patient presents with   Urinary Retention    HPI: 81 y.o. male previously seen for BPH presents for evaluation of worsening lower urinary tract symptoms.  Initially seen 02/2018 after an episode of urinary retention during a hospitalization.  He was started on tamsulosin and had a successful voiding trial Last seen 02/2019 and PVR was 0 mL Called for an appointment last week with complaints of frequency, urgency with urge incontinence urinary hesitancy and a weak urinary stream.  IPSS today 12/35 No longer taking tamsulosin and he is not sure how long he has been off this medication Denies dysuria, gross hematuria No flank, abdominal or pelvic pain   PMH: Past Medical History:  Diagnosis Date   Allergy    Anemia    Anxiety    Arthritis    CHF (congestive heart failure) (HCC)    Collagen vascular disease ([place])    Depression    Diabetes mellitus without complication (HCC)    GERD (gastroesophageal reflux disease)    Hypertension    Rheumatoid arthritis ([place])    Seizures ([place])    Sepsis ([place]) 05/23/2015    Surgical History: Past Surgical History:  Procedure Laterality Date   BACK SURGERY     back surgery five times  04/2016   CARPAL TUNNEL RELEASE     COLONOSCOPY WITH PROPOFOL N/A 04/27/2020   Procedure: COLONOSCOPY WITH PROPOFOL;  Surgeon: Toledo, Benay Pike, MD;  Location: [facility] ENDOSCOPY;  Service: Gastroenterology;  Laterality: N/A;   ESOPHAGOGASTRODUODENOSCOPY (EGD) WITH PROPOFOL N/A 04/27/2020   Procedure: ESOPHAGOGASTRODUODENOSCOPY (EGD) WITH PROPOFOL;  Surgeon: Toledo, Benay Pike, MD;  Location: [facility] END[facility]COPY;  Service: Gastroenterology;  Laterality: N/A;   HERNIA REPAIR     JOINT REPLACEMENT     knee replacement right     PEG PLACEMENT N/A 04/24/2019    Procedure: PERCUTANEOUS ENDOSCOPIC GASTROSTOMY (PEG) PLACEMENT;  Surgeon: Toledo, Benay Pike, MD;  Location: [facility] ENDOSCOPY;  Service: Gastroenterology;  Laterality: N/A;   right hip replacement     rotator cuff surgery     SPINE SURGERY      Home Medications:  Allergies as of 11/30/2021       Reactions   Cefoperazone Anaphylaxis   Penicillins Anaphylaxis, Other (See Comments)   Has patient had a PCN reaction causing immediate rash, facial/tongue/throat swelling, SOB or lightheadedness with hypotension: Yes Has patient had a PCN reaction causing severe rash involving mucus membranes or skin necrosis: No Has patient had a PCN reaction that required hospitalization No Has patient had a PCN reaction occurring within the last 10 years: No If all of the above answers are "NO", then may proceed with Cephalosporin use.   Latex Rash        Medication List        Accurate as of November 30, 2021  4:27 PM. If you have any questions, ask your nurse or doctor.          STOP taking these medications    guaiFENesin-dextromethorphan 100-10 MG/5ML syrup Commonly known as: ROBITUSSIN DM Stopped by: Abbie Sons, MD   phenylephrine 10 MG Tabs tablet Commonly known as: SUDAFED PE Stopped by: Abbie Sons, MD   thiamine 100 MG tablet Stopped by: Abbie Sons, MD       TAKE these  medications    acetaminophen 325 MG tablet Commonly known as: TYLENOL Take 2 tablets (650 mg total) by mouth every 6 (six) hours as needed for mild pain (or Fever >/= 101).   aspirin 81 MG chewable tablet Chew 81 mg by mouth daily.   ferrous sulfate 325 (65 FE) MG tablet Take 325 mg by mouth daily with breakfast.   folic acid 1 MG tablet Commonly known as: FOLVITE Take 1 tablet (1 mg total) by mouth daily.   furosemide 40 MG tablet Commonly known as: LASIX Take 40 mg by mouth daily.   gabapentin 300 MG capsule Commonly known as: NEURONTIN Take 900 mg by mouth 2 (two) times daily.    hydroxychloroquine 200 MG tablet Commonly known as: PLAQUENIL Take 200 mg by mouth 2 (two) times daily.   lamoTRIgine 200 MG tablet Commonly known as: LAMICTAL Take 200 mg by mouth 2 (two) times daily.   lamoTRIgine 25 MG tablet Commonly known as: LAMICTAL Take 25 mg by mouth 2 (two) times daily.   leflunomide 10 MG tablet Commonly known as: ARAVA Take 10 mg by mouth daily.   magnesium oxide 400 MG tablet Commonly known as: MAG-OX Take 400 mg by mouth daily.   metoprolol tartrate 25 MG tablet Commonly known as: LOPRESSOR Take 1 tablet (25 mg total) by mouth 2 (two) times daily.   omeprazole 40 MG capsule Commonly known as: PRILOSEC Take 40 mg by mouth daily as needed.   polyethylene glycol 17 g packet Commonly known as: MIRALAX / GLYCOLAX Take 17 g by mouth daily as needed.   predniSONE 5 MG tablet Commonly known as: DELTASONE Take 5 mg by mouth daily.   silodosin 8 MG Caps capsule Commonly known as: RAPAFLO Take 1 capsule (8 mg total) by mouth daily with breakfast. Started by: Abbie Sons, MD   traZODone 50 MG tablet Commonly known as: DESYREL Take 50 mg by mouth at bedtime.   Vitamin D3 25 MCG tablet Commonly known as: Vitamin D Take 1 tablet (1,000 Units total) by mouth daily.        Allergies:  Allergies  Allergen Reactions   Cefoperazone Anaphylaxis   Penicillins Anaphylaxis and Other (See Comments)    Has patient had a PCN reaction causing immediate rash, facial/tongue/throat swelling, SOB or lightheadedness with hypotension: Yes Has patient had a PCN reaction causing severe rash involving mucus membranes or skin necrosis: No Has patient had a PCN reaction that required hospitalization No Has patient had a PCN reaction occurring within the last 10 years: No If all of the above answers are "NO", then may proceed with Cephalosporin use.   Latex Rash    Family History: Family History  Problem Relation Age of Onset   Hypertension Mother     Dementia Mother    Dementia Father    Hypertension Other    Prostate cancer Neg Hx    Bladder Cancer Neg Hx    Kidney cancer Neg Hx     Social History:  reports that he has quit smoking. His smoking use included cigarettes. He has a 20.00 pack-year smoking history. He has never used smokeless tobacco. He reports that he does not drink alcohol and does not use drugs.   Physical Exam: BP 107/73   Pulse (!) 111   Ht 6' (1.829 m)   Wt 265 lb (120.2 kg)   BMI 35.94 kg/m   Constitutional:  Alert, No acute distress. HEENT: [place] AT Respiratory: Normal respiratory effort, no increased work  of breathing. Psychiatric: Normal mood and affect.  Laboratory Data:  Urinalysis Dipstick/1+ protein/1+ ketone; microscopy negative    Assessment & Plan:    1.  BPH with LUTS Moderate lower urinary tract symptoms Previously on tamsulosin which was effective Rx silodosin 8 mg sent to pharmacy PVR 0 mL PA follow-up 1 month for symptom recheck Beta 3 agonist trial if no improvement on alpha-blocker   Abbie Sons, MD  [facility] [address], [place], [place] [address] [phone]

## 2022-01-01 ENCOUNTER — Ambulatory Visit: Payer: Medicare PPO | Admitting: Physician Assistant

## 2022-01-09 ENCOUNTER — Ambulatory Visit (INDEPENDENT_AMBULATORY_CARE_PROVIDER_SITE_OTHER): Payer: Medicare PPO | Admitting: Physician Assistant

## 2022-01-09 ENCOUNTER — Encounter: Payer: Self-pay | Admitting: Physician Assistant

## 2022-01-09 VITALS — BP 103/72 | HR 103 | Ht 72.0 in | Wt 265.0 lb

## 2022-01-09 DIAGNOSIS — N401 Enlarged prostate with lower urinary tract symptoms: Secondary | ICD-10-CM | POA: Diagnosis not present

## 2022-01-09 MED ORDER — SILODOSIN 8 MG PO CAPS: 8.0000 mg | ORAL_CAPSULE | Freq: Every day | ORAL | 11 refills | Status: DC

## 2022-01-09 NOTE — Progress Notes (Unsigned)
01/09/2022 5:17 PM   Jeremiah [name] [birth year] [id number]  CC: Chief Complaint  Patient presents with   Other   HPI: Jeremiah [name]. is a 81 y.o. male with PMH BPH with LUTS who presents today for recheck on silodosin.   Today he reports significant improvement in his urinary symptoms on silodosin.  He no longer feels that he is straining to void.  His urgency and frequency have dramatically improved as well.  Overall, he is pleased and denies orthostasis.  He wishes to continue the medication.  In-office UA today positive for 1+ protein; urine microscopy with hyaline and granular casts and moderate bacteria.   PMH: Past Medical History:  Diagnosis Date   Allergy    Anemia    Anxiety    Arthritis    CHF (congestive heart failure) (HCC)    Collagen vascular disease ([place])    Depression    Diabetes mellitus without complication (HCC)    GERD (gastroesophageal reflux disease)    Hypertension    Rheumatoid arthritis ([place])    Seizures ([place])    Sepsis ([place]) 05/23/2015    Surgical History: Past Surgical History:  Procedure Laterality Date   BACK SURGERY     back surgery five times  04/2016   CARPAL TUNNEL RELEASE     COLONOSCOPY WITH PROPOFOL N/A 04/27/2020   Procedure: COLONOSCOPY WITH PROPOFOL;  Surgeon: Toledo, Benay Pike, MD;  Location: [facility] ENDOSCOPY;  Service: Gastroenterology;  Laterality: N/A;   ESOPHAGOGASTRODUODENOSCOPY (EGD) WITH PROPOFOL N/A 04/27/2020   Procedure: ESOPHAGOGASTRODUODENOSCOPY (EGD) WITH PROPOFOL;  Surgeon: Toledo, Benay Pike, MD;  Location: [facility] ENDOSCOPY;  Service: Gastroenterology;  Laterality: N/A;   HERNIA REPAIR     JOINT REPLACEMENT     knee replacement right     PEG PLACEMENT N/A 04/24/2019   Procedure: PERCUTANEOUS ENDOSCOPIC GASTROSTOMY (PEG) PLACEMENT;  Surgeon: Toledo, Benay Pike, MD;  Location: [facility] ENDOSCOPY;  Service: Gastroenterology;  Laterality: N/A;   right hip replacement     rotator cuff surgery     SPINE SURGERY       Home Medications:  Allergies as of 01/09/2022       Reactions   Cefoperazone Anaphylaxis   Penicillins Anaphylaxis, Other (See Comments)   Has patient had a PCN reaction causing immediate rash, facial/tongue/throat swelling, SOB or lightheadedness with hypotension: Yes Has patient had a PCN reaction causing severe rash involving mucus membranes or skin necrosis: No Has patient had a PCN reaction that required hospitalization No Has patient had a PCN reaction occurring within the last 10 years: No If all of the above answers are "NO", then may proceed with Cephalosporin use.   Latex Rash        Medication List        Accurate as of January 09, 2022  5:17 PM. If you have any questions, ask your nurse or doctor.          acetaminophen 325 MG tablet Commonly known as: TYLENOL Take 2 tablets (650 mg total) by mouth every 6 (six) hours as needed for mild pain (or Fever >/= 101).   aspirin 81 MG chewable tablet Chew 81 mg by mouth daily.   ferrous sulfate 325 (65 FE) MG tablet Take 325 mg by mouth daily with breakfast.   folic acid 1 MG tablet Commonly known as: FOLVITE Take 1 tablet (1 mg total) by mouth daily.   furosemide 40 MG tablet Commonly known as: LASIX Take 40 mg by mouth daily.  gabapentin 300 MG capsule Commonly known as: NEURONTIN Take 900 mg by mouth 2 (two) times daily.   hydroxychloroquine 200 MG tablet Commonly known as: PLAQUENIL Take 200 mg by mouth 2 (two) times daily.   lamoTRIgine 200 MG tablet Commonly known as: LAMICTAL Take 200 mg by mouth 2 (two) times daily.   lamoTRIgine 25 MG tablet Commonly known as: LAMICTAL Take 25 mg by mouth 2 (two) times daily.   leflunomide 10 MG tablet Commonly known as: ARAVA Take 10 mg by mouth daily.   magnesium oxide 400 MG tablet Commonly known as: MAG-OX Take 400 mg by mouth daily.   metoprolol tartrate 25 MG tablet Commonly known as: LOPRESSOR Take 1 tablet (25 mg total) by mouth 2 (two)  times daily.   omeprazole 40 MG capsule Commonly known as: PRILOSEC Take 40 mg by mouth daily as needed.   polyethylene glycol 17 g packet Commonly known as: MIRALAX / GLYCOLAX Take 17 g by mouth daily as needed.   predniSONE 5 MG tablet Commonly known as: DELTASONE Take 5 mg by mouth daily.   silodosin 8 MG Caps capsule Commonly known as: RAPAFLO Take 1 capsule (8 mg total) by mouth daily with breakfast.   traZODone 50 MG tablet Commonly known as: DESYREL Take 50 mg by mouth at bedtime.   vitamin D3 25 MCG tablet Commonly known as: CHOLECALCIFEROL Take 1 tablet (1,000 Units total) by mouth daily.        Allergies:  Allergies  Allergen Reactions   Cefoperazone Anaphylaxis   Penicillins Anaphylaxis and Other (See Comments)    Has patient had a PCN reaction causing immediate rash, facial/tongue/throat swelling, SOB or lightheadedness with hypotension: Yes Has patient had a PCN reaction causing severe rash involving mucus membranes or skin necrosis: No Has patient had a PCN reaction that required hospitalization No Has patient had a PCN reaction occurring within the last 10 years: No If all of the above answers are "NO", then may proceed with Cephalosporin use.   Latex Rash    Family History: Family History  Problem Relation Age of Onset   Hypertension Mother    Dementia Mother    Dementia Father    Hypertension Other    Prostate cancer Neg Hx    Bladder Cancer Neg Hx    Kidney cancer Neg Hx     Social History:   reports that he has quit smoking. His smoking use included cigarettes. He has a 20.00 pack-year smoking history. He has never used smokeless tobacco. He reports that he does not drink alcohol and does not use drugs.  Physical Exam: BP 103/72   Pulse (!) 103   Ht 6' (1.829 m)   Wt 265 lb (120.2 kg)   BMI 35.94 kg/m   Constitutional:  Alert and oriented, no acute distress, nontoxic appearing HEENT: [place], AT Cardiovascular: No clubbing, cyanosis, or  edema Respiratory: Normal respiratory effort, no increased work of breathing Skin: No rashes, bruises or suspicious lesions Neurologic: Grossly intact, no focal deficits, moving all 4 extremities Psychiatric: Normal mood and affect  Laboratory Data: Results for orders placed or performed in visit on 01/09/22  Microscopic Examination   Urine  Result Value Ref Range   WBC, UA 0-5 0 - 5 /hpf   RBC, Urine 0-2 0 - 2 /hpf   Epithelial Cells (non renal) 0-10 0 - 10 /hpf   Casts Present (A) None seen /lpf   Cast Type Hyaline casts N/A   Mucus, UA Present (A)  Not Estab.   Bacteria, UA Moderate (A) None seen/Few  Urinalysis, Complete  Result Value Ref Range   Specific Gravity, UA 1.030 1.005 - 1.030   pH, UA 6.0 5.0 - 7.5   Color, UA Yellow Yellow   Appearance Ur Clear Clear   Leukocytes,UA Negative Negative   Protein,UA 1+ (A) Negative/Trace   Glucose, UA Negative Negative   Ketones, UA Negative Negative   RBC, UA Negative Negative   Bilirubin, UA Negative Negative   Urobilinogen, Ur 0.2 0.2 - 1.0 mg/dL   Nitrite, UA Negative Negative   Microscopic Examination See below:    Assessment & Plan:   1. Benign prostatic hyperplasia with lower urinary tract symptoms, symptom details unspecified Significant improvement on silodosin, we will plan to continue this.  We will plan for annual follow-up, sooner if needed.  He is in agreement with this plan. - Urinalysis, Complete - silodosin (RAPAFLO) 8 MG CAPS capsule; Take 1 capsule (8 mg total) by mouth daily with breakfast.  Dispense: 30 capsule; Refill: 11  Return in about 1 year (around 01/10/2023) for Annual IPSS/PVR .  Debroah Loop, PA-C  [facility] [address] [place], [place] [address] [phone]

## 2022-01-10 LAB — URINALYSIS, COMPLETE
Bilirubin, UA: NEGATIVE
Glucose, UA: NEGATIVE
Ketones, UA: NEGATIVE
Leukocytes,UA: NEGATIVE
Nitrite, UA: NEGATIVE
RBC, UA: NEGATIVE
Specific Gravity, UA: 1.03 (ref 1.005–1.030)
Urobilinogen, Ur: 0.2 mg/dL (ref 0.2–1.0)
pH, UA: 6 (ref 5.0–7.5)

## 2022-01-10 LAB — MICROSCOPIC EXAMINATION

## 2022-05-03 ENCOUNTER — Emergency Department: Payer: Medicare PPO

## 2022-05-03 ENCOUNTER — Emergency Department
Admission: EM | Admit: 2022-05-03 | Discharge: 2022-05-03 | Disposition: A | Discharge status: Home / Self Care | Payer: Medicare PPO | Attending: Emergency Medicine | Admitting: Emergency Medicine

## 2022-05-03 ENCOUNTER — Other Ambulatory Visit: Payer: Self-pay

## 2022-05-03 DIAGNOSIS — J40 Bronchitis, not specified as acute or chronic: Secondary | ICD-10-CM | POA: Insufficient documentation

## 2022-05-03 DIAGNOSIS — I110 Hypertensive heart disease with heart failure: Secondary | ICD-10-CM | POA: Insufficient documentation

## 2022-05-03 DIAGNOSIS — I509 Heart failure, unspecified: Secondary | ICD-10-CM | POA: Diagnosis not present

## 2022-05-03 DIAGNOSIS — R531 Weakness: Secondary | ICD-10-CM | POA: Insufficient documentation

## 2022-05-03 DIAGNOSIS — E119 Type 2 diabetes mellitus without complications: Secondary | ICD-10-CM | POA: Diagnosis not present

## 2022-05-03 DIAGNOSIS — R600 Localized edema: Secondary | ICD-10-CM | POA: Diagnosis not present

## 2022-05-03 DIAGNOSIS — Z20822 Contact with and (suspected) exposure to covid-19: Secondary | ICD-10-CM | POA: Insufficient documentation

## 2022-05-03 DIAGNOSIS — R059 Cough, unspecified: Secondary | ICD-10-CM | POA: Diagnosis present

## 2022-05-03 LAB — RESP PANEL BY RT-PCR (RSV, FLU A&B, COVID)  RVPGX2
Influenza A by PCR: NEGATIVE
Influenza B by PCR: NEGATIVE
Resp Syncytial Virus by PCR: NEGATIVE
SARS Coronavirus 2 by RT PCR: NEGATIVE

## 2022-05-03 LAB — URINALYSIS, COMPLETE (UACMP) WITH MICROSCOPIC
Bacteria, UA: NONE SEEN
Bilirubin Urine: NEGATIVE
Glucose, UA: NEGATIVE mg/dL
Hgb urine dipstick: NEGATIVE
Ketones, ur: NEGATIVE mg/dL
Leukocytes,Ua: NEGATIVE
Nitrite: NEGATIVE
Protein, ur: 30 mg/dL — AB
Specific Gravity, Urine: 1.023 (ref 1.005–1.030)
Squamous Epithelial / HPF: NONE SEEN (ref 0–5)
pH: 6 (ref 5.0–8.0)

## 2022-05-03 LAB — COMPREHENSIVE METABOLIC PANEL
ALT: 14 U/L (ref 0–44)
AST: 19 U/L (ref 15–41)
Albumin: 3.5 g/dL (ref 3.5–5.0)
Alkaline Phosphatase: 97 U/L (ref 38–126)
Anion gap: 6 (ref 5–15)
BUN: 16 mg/dL (ref 8–23)
CO2: 29 mmol/L (ref 22–32)
Calcium: 8.8 mg/dL — ABNORMAL LOW (ref 8.9–10.3)
Chloride: 102 mmol/L (ref 98–111)
Creatinine, Ser: 1.24 mg/dL (ref 0.61–1.24)
GFR, Estimated: 59 mL/min — ABNORMAL LOW (ref >60)
Glucose, Bld: 123 mg/dL — ABNORMAL HIGH (ref 70–99)
Potassium: 4 mmol/L (ref 3.5–5.1)
Sodium: 137 mmol/L (ref 135–145)
Total Bilirubin: 0.7 mg/dL (ref 0.3–1.2)
Total Protein: 7.3 g/dL (ref 6.5–8.1)

## 2022-05-03 LAB — CBC
HCT: 42.7 % (ref 39.0–52.0)
Hemoglobin: 13.2 g/dL (ref 13.0–17.0)
MCH: 27 pg (ref 26.0–34.0)
MCHC: 30.9 g/dL (ref 30.0–36.0)
MCV: 87.3 fL (ref 80.0–100.0)
Platelets: 235 10*3/uL (ref 150–400)
RBC: 4.89 MIL/uL (ref 4.22–5.81)
RDW: 15.6 % — ABNORMAL HIGH (ref 11.5–15.5)
WBC: 6.9 10*3/uL (ref 4.0–10.5)
nRBC: 0 % (ref 0.0–0.2)

## 2022-05-03 LAB — BRAIN NATRIURETIC PEPTIDE: B Natriuretic Peptide: 94.5 pg/mL (ref 0.0–100.0)

## 2022-05-03 LAB — TROPONIN I (HIGH SENSITIVITY): Troponin I (High Sensitivity): 10 ng/L (ref <18)

## 2022-05-03 MED ORDER — ALBUTEROL SULFATE HFA 108 (90 BASE) MCG/ACT IN AERS: 2.0000 | INHALATION_SPRAY | Freq: Four times a day (QID) | RESPIRATORY_TRACT | 0 refills | Status: AC | PRN

## 2022-05-03 MED ORDER — HYDROCOD POLI-CHLORPHE POLI ER 10-8 MG/5ML PO SUER
5.0000 mL | Freq: Once | ORAL | Status: AC
  Administered 2022-05-03: 5 mL via ORAL
  Filled 2022-05-03: qty 5

## 2022-05-03 MED ORDER — GUAIFENESIN-CODEINE 100-10 MG/5ML PO SOLN: 5.0000 mL | Freq: Four times a day (QID) | ORAL | 0 refills | Status: DC | PRN

## 2022-05-03 MED ORDER — SODIUM CHLORIDE 0.9 % IV BOLUS
500.0000 mL | Freq: Once | INTRAVENOUS | Status: AC
  Administered 2022-05-03: 500 mL via INTRAVENOUS

## 2022-05-03 MED ORDER — GUAIFENESIN-CODEINE 100-10 MG/5ML PO SOLN: 5.0000 mL | Freq: Four times a day (QID) | ORAL | 0 refills | Status: AC | PRN

## 2022-05-03 MED ORDER — AZITHROMYCIN 500 MG PO TABS
500.0000 mg | ORAL_TABLET | Freq: Once | ORAL | Status: AC
  Administered 2022-05-03: 500 mg via ORAL
  Filled 2022-05-03: qty 1

## 2022-05-03 MED ORDER — AZITHROMYCIN 250 MG PO TABS: ORAL_TABLET | ORAL | 0 refills | Status: AC

## 2022-05-03 NOTE — ED Triage Notes (Signed)
Pt arrives EMS from home with complaints of congestion,cough and flu like symptoms. Pt has significant swelling to bilateral legs. Pt reports he has not been able to ambulate well in the last few days.

## 2022-05-03 NOTE — ED Provider Notes (Signed)
[facility] Provider Note    Event Date/Time   First MD Initiated Contact with Patient 05/03/22 1842     (approximate)  History   Chief Complaint: Fatigue and Cough  HPI  Jeremiah Alicea. is a 81 y.o. male with a past medical history of anemia, anxiety, CHF, diabetes, hypertension, presents to the emergency department for generalized weakness cough and congestion.  According to the patient for the past 2 days he has been coughing, states he has been awake all night last night due to the cough, he has felt somewhat congested and today has felt very weak.  Patient satting 94% on room air with no baseline O2 requirement.  Patient does have lower extremity edema which she states is baseline.  Denies any abdominal pain or chest pain.  Denies any vomiting or diarrhea.  No dysuria.  No reported fever at home, 97.8 in the emergency department.  Patient lives at home with his wife and son.  Physical Exam   Triage Vital Signs: ED Triage Vitals  Enc Vitals Group     BP 05/03/22 1846 115/76     Pulse Rate 05/03/22 1846 93     Resp 05/03/22 1846 20     Temp 05/03/22 1846 97.8 F (36.6 C)     Temp src --      SpO2 05/03/22 1846 94 %     Weight 05/03/22 1843 (!) 310 lb (140.6 kg)     Height --      Head Circumference --      Peak Flow --      Pain Score --      Pain Loc --      Pain Edu? --      Excl. in [place]? --     Most recent vital signs: Vitals:   05/03/22 1846  BP: 115/76  Pulse: 93  Resp: 20  Temp: 97.8 F (36.6 C)  SpO2: 94%    General: Awake, no distress.  Occasional cough during examination. CV:  Good peripheral perfusion.  Regular rate and rhythm  Resp:  Normal effort.  Equal breath sounds bilaterally.  Abd:  No distention.  Soft, nontender.  No rebound or guarding. Other:  Patient has 3+ lower extreme edema bilaterally which she states is chronic and largely unchanged.   ED Results / Procedures / Treatments   EKG  EKG viewed and  interpreted by myself shows a normal sinus rhythm at 96 bpm with a widened QRS, normal axis, slight QTc prolongation otherwise normal intervals, nonspecific ST changes.  Most consistent with left bundle branch block.  RADIOLOGY  I reviewed and interpreted the chest x-ray images.  Low lung volumes but I do not see any obvious consolidation on my evaluation. Radiology has read the chest x-ray as low lung volumes with bibasilar opacities representing atelectasis versus pneumonia.   MEDICATIONS ORDERED IN ED: Medications  sodium chloride 0.9 % bolus 500 mL (has no administration in time range)     IMPRESSION / MDM / ASSESSMENT AND PLAN / ED COURSE  I reviewed the triage vital signs and the nursing notes.  Patient's presentation is most consistent with acute presentation with potential threat to life or bodily function.  Patient presents emergency department for shortness of breath cough generalized weakness.  Overall patient appears well, does have an occasional cough during my examination.  Satting 94% on room air.  Describes generalized fatigue and weakness unable to ambulate today.  Given the patient's cough  and congestion with weakness we will check for COVID/flu/RSV.  Will obtain a chest x-ray to rule out pneumonia or pulmonary edema.  Will check labs including cardiac enzymes and continue to closely monitor.  Patient agreeable to plan of care.  Patient's workup is overall nonrevealing.  CBC is reassuring with a normal white blood cell count, chemistry shows normal results including normal renal function.  Patient's troponin is negative, BNP is normal, RSV flu and COVID are negative.  Chest x-ray shows atelectasis versus pneumonia.  Given the patient's cough I believe it is warranted to treat with antibiotics as a precaution and have the patient follow-up with his doctor.  Patient currently satting 99% on room air given his reassuring workup I believe the patient will be safe for discharge home  and outpatient follow-up.  Given the nondiagnostic x-ray we will obtain a CT image to rule out pneumonia.  CT is resulted showing most consistent with acute bronchitis but no obvious pneumonia.  Will give a one-time dose of Decadron and discharged with Zithromax, albuterol and cough medication.  Patient and son are agreeable to this plan.  Son who cares for the patient states they have lifts in the house to help move the patient.  FINAL CLINICAL IMPRESSION(S) / ED DIAGNOSES   Bronchitis Weakness    Note:  This document was prepared using Dragon voice recognition software and may include unintentional dictation errors.   Harvest Dark, MD 05/03/22 2227

## 2022-05-03 NOTE — Discharge Instructions (Addendum)
Please take antibiotics as prescribed for their entire course.  You may use over-the-counter Mucinex during the day and you may use your prescribed cough medication at night.  This cough medication may make you tired.  Do not drink alcohol or drive while taking this medication.  Please follow-up with your doctor within the next several days for recheck/reevaluation.  Return to the emergency department for any worsening cough, any fever any trouble breathing or any other symptom personally concerning to yourself.

## 2022-07-02 ENCOUNTER — Other Ambulatory Visit: Payer: Self-pay | Admitting: *Deleted

## 2022-07-02 DIAGNOSIS — D72820 Lymphocytosis (symptomatic): Secondary | ICD-10-CM

## 2022-07-02 DIAGNOSIS — D509 Iron deficiency anemia, unspecified: Secondary | ICD-10-CM

## 2022-07-03 ENCOUNTER — Inpatient Hospital Stay: Payer: Medicare PPO | Attending: Oncology

## 2022-07-03 DIAGNOSIS — D509 Iron deficiency anemia, unspecified: Secondary | ICD-10-CM | POA: Diagnosis present

## 2022-07-03 DIAGNOSIS — D72820 Lymphocytosis (symptomatic): Secondary | ICD-10-CM | POA: Insufficient documentation

## 2022-07-03 LAB — CMP (CANCER CENTER ONLY)
ALT: 14 U/L (ref 0–44)
AST: 19 U/L (ref 15–41)
Albumin: 3.7 g/dL (ref 3.5–5.0)
Alkaline Phosphatase: 106 U/L (ref 38–126)
Anion gap: 9 (ref 5–15)
BUN: 15 mg/dL (ref 8–23)
CO2: 29 mmol/L (ref 22–32)
Calcium: 9.1 mg/dL (ref 8.9–10.3)
Chloride: 100 mmol/L (ref 98–111)
Creatinine: 0.99 mg/dL (ref 0.61–1.24)
GFR, Estimated: >60 mL/min (ref >60)
Glucose, Bld: 95 mg/dL (ref 70–99)
Potassium: 3.5 mmol/L (ref 3.5–5.1)
Sodium: 138 mmol/L (ref 135–145)
Total Bilirubin: 0.6 mg/dL (ref 0.3–1.2)
Total Protein: 7.2 g/dL (ref 6.5–8.1)

## 2022-07-03 LAB — CBC WITH DIFFERENTIAL/PLATELET
Abs Immature Granulocytes: 0.02 10*3/uL (ref 0.00–0.07)
Basophils Absolute: 0.1 10*3/uL (ref 0.0–0.1)
Basophils Relative: 1 %
Eosinophils Absolute: 0.2 10*3/uL (ref 0.0–0.5)
Eosinophils Relative: 2 %
HCT: 46.1 % (ref 39.0–52.0)
Hemoglobin: 14.5 g/dL (ref 13.0–17.0)
Immature Granulocytes: 0 %
Lymphocytes Relative: 21 %
Lymphs Abs: 1.4 10*3/uL (ref 0.7–4.0)
MCH: 26.9 pg (ref 26.0–34.0)
MCHC: 31.5 g/dL (ref 30.0–36.0)
MCV: 85.4 fL (ref 80.0–100.0)
Monocytes Absolute: 0.5 10*3/uL (ref 0.1–1.0)
Monocytes Relative: 8 %
Neutro Abs: 4.4 10*3/uL (ref 1.7–7.7)
Neutrophils Relative %: 68 %
Platelets: 213 10*3/uL (ref 150–400)
RBC: 5.4 MIL/uL (ref 4.22–5.81)
RDW: 16.4 % — ABNORMAL HIGH (ref 11.5–15.5)
WBC: 6.5 10*3/uL (ref 4.0–10.5)
nRBC: 0 % (ref 0.0–0.2)

## 2022-07-03 LAB — IRON AND TIBC
Iron: 95 ug/dL (ref 45–182)
Saturation Ratios: 29 % (ref 17.9–39.5)
TIBC: 323 ug/dL (ref 250–450)
UIBC: 228 ug/dL

## 2022-07-03 LAB — LACTATE DEHYDROGENASE: LDH: 156 U/L (ref 98–192)

## 2022-07-03 LAB — FERRITIN: Ferritin: 49 ng/mL (ref 24–336)

## 2022-07-04 LAB — KAPPA/LAMBDA LIGHT CHAINS
Kappa free light chain: 32.8 mg/L — ABNORMAL HIGH (ref 3.3–19.4)
Kappa, lambda light chain ratio: 1.8 — ABNORMAL HIGH (ref 0.26–1.65)
Lambda free light chains: 18.2 mg/L (ref 5.7–26.3)

## 2022-07-05 LAB — COMP PANEL: LEUKEMIA/LYMPHOMA: Immunophenotypic Profile: 1

## 2022-07-09 LAB — MULTIPLE MYELOMA PANEL, SERUM
Albumin SerPl Elph-Mcnc: 3.5 g/dL (ref 2.9–4.4)
Albumin/Glob SerPl: 1.3 (ref 0.7–1.7)
Alpha 1: 0.3 g/dL (ref 0.0–0.4)
Alpha2 Glob SerPl Elph-Mcnc: 0.7 g/dL (ref 0.4–1.0)
B-Globulin SerPl Elph-Mcnc: 0.9 g/dL (ref 0.7–1.3)
Gamma Glob SerPl Elph-Mcnc: 1 g/dL (ref 0.4–1.8)
Globulin, Total: 2.9 g/dL (ref 2.2–3.9)
IgA: 363 mg/dL (ref 61–437)
IgG (Immunoglobin G), Serum: 951 mg/dL (ref 603–1613)
IgM (Immunoglobulin M), Srm: 101 mg/dL (ref 15–143)
Total Protein ELP: 6.4 g/dL (ref 6.0–8.5)

## 2022-07-10 ENCOUNTER — Inpatient Hospital Stay: Payer: Medicare PPO | Admitting: Oncology

## 2022-07-12 ENCOUNTER — Inpatient Hospital Stay: Payer: Medicare PPO | Admitting: Oncology

## 2022-08-01 ENCOUNTER — Encounter: Payer: Self-pay | Admitting: Oncology

## 2022-08-01 ENCOUNTER — Inpatient Hospital Stay: Payer: Medicare PPO | Attending: Oncology | Admitting: Oncology

## 2022-08-01 DIAGNOSIS — I110 Hypertensive heart disease with heart failure: Secondary | ICD-10-CM | POA: Diagnosis not present

## 2022-08-01 DIAGNOSIS — E119 Type 2 diabetes mellitus without complications: Secondary | ICD-10-CM | POA: Diagnosis not present

## 2022-08-01 DIAGNOSIS — Z87891 Personal history of nicotine dependence: Secondary | ICD-10-CM | POA: Insufficient documentation

## 2022-08-01 DIAGNOSIS — D72820 Lymphocytosis (symptomatic): Secondary | ICD-10-CM | POA: Insufficient documentation

## 2022-08-01 DIAGNOSIS — D509 Iron deficiency anemia, unspecified: Secondary | ICD-10-CM

## 2022-08-01 DIAGNOSIS — I509 Heart failure, unspecified: Secondary | ICD-10-CM | POA: Diagnosis not present

## 2022-08-01 NOTE — Assessment & Plan Note (Signed)
#   Monoclonal B-cell lymphocytosis Labs are reviewed and discussed with patient. Wbc has been stable. He has no constitutional symptoms.  Continue observation.

## 2022-08-01 NOTE — Progress Notes (Signed)
HEMATOLOGY-ONCOLOGY TeleHEALTH VISIT PROGRESS NOTE  I connected with Jeremiah [name]. on 08/01/22  at  2:45 PM EDT by video enabled telemedicine visit and verified that I am speaking with the correct person using two identifiers. I discussed the limitations, risks, security and privacy concerns of performing an evaluation and management service by telemedicine and the availability of in-person appointments. The patient expressed understanding and agreed to proceed.   Other persons participating in the visit and their role in the encounter:  None  Patient's location: Home  Provider's location: office Chief Complaint: # Monoclonal B-cell lymphocytosis    INTERVAL HISTORY Jeremiah [name]. is a 82 y.o. male who has above history reviewed by me today presents virtually for follow up visit for management of # Monoclonal B-cell lymphocytosis Denies weight loss, fever, chills, fatigue, night sweats.    Review of Systems  Constitutional:  Negative for appetite change, fatigue and unexpected weight change.  Respiratory:  Negative for shortness of breath.   Cardiovascular:  Negative for chest pain.  Gastrointestinal:  Negative for abdominal pain.  Genitourinary:  Negative for difficulty urinating.   Hematological:  Negative for adenopathy.  Psychiatric/Behavioral:  Negative for confusion.     Past Medical History:  Diagnosis Date   Allergy    Anemia    Anxiety    Arthritis    CHF (congestive heart failure) (HCC)    Collagen vascular disease ([place])    Depression    Diabetes mellitus without complication (HCC)    GERD (gastroesophageal reflux disease)    Hypertension    Rheumatoid arthritis ([place])    Seizures ([place])    Sepsis ([place]) 05/23/2015   Past Surgical History:  Procedure Laterality Date   BACK SURGERY     back surgery five times  04/2016   CARPAL TUNNEL RELEASE     COLONOSCOPY WITH PROPOFOL N/A 04/27/2020   Procedure: COLONOSCOPY WITH PROPOFOL;  Surgeon: Toledo, Benay Pike, MD;   Location: [facility] ENDOSCOPY;  Service: Gastroenterology;  Laterality: N/A;   ESOPHAGOGASTRODUODENOSCOPY (EGD) WITH PROPOFOL N/A 04/27/2020   Procedure: ESOPHAGOGASTRODUODENOSCOPY (EGD) WITH PROPOFOL;  Surgeon: Toledo, Benay Pike, MD;  Location: [facility] ENDOSCOPY;  Service: Gastroenterology;  Laterality: N/A;   HERNIA REPAIR     JOINT REPLACEMENT     knee replacement right     PEG PLACEMENT N/A 04/24/2019   Procedure: PERCUTANEOUS ENDOSCOPIC GASTROSTOMY (PEG) PLACEMENT;  Surgeon: Toledo, Benay Pike, MD;  Location: [facility] ENDOSCOPY;  Service: Gastroenterology;  Laterality: N/A;   right hip replacement     rotator cuff surgery     SPINE SURGERY      Family History  Problem Relation Age of Onset   Hypertension Mother    Dementia Mother    Dementia Father    Hypertension Other    Prostate cancer Neg Hx    Bladder Cancer Neg Hx    Kidney cancer Neg Hx     Social History   Socioeconomic History   Marital status: Married    Spouse name: Not on file   Number of children: Not on file   Years of education: Not on file   Highest education level: Not on file  Occupational History   Not on file  Tobacco Use   Smoking status: Former    Packs/day: 1.00    Years: 20.00    Additional pack years: 0.00    Total pack years: 20.00    Types: Cigarettes   Smokeless tobacco: Never  Vaping Use   Vaping Use:  Never used  Substance and Sexual Activity   Alcohol use: No   Drug use: Never   Sexual activity: Not on file  Other Topics Concern   Not on file  Social History Narrative   Not on file   Social Determinants of Health   Financial Resource Strain: Not on file  Food Insecurity: Not on file  Transportation Needs: Not on file  Physical Activity: Not on file  Stress: Not on file  Social Connections: Not on file  Intimate Partner Violence: Not on file    Current Outpatient Medications on File Prior to Visit  Medication Sig Dispense Refill   acetaminophen (TYLENOL) 325 MG tablet Take 2 tablets  (650 mg total) by mouth every 6 (six) hours as needed for mild pain (or Fever >/= 101).     albuterol (VENTOLIN HFA) 108 (90 Base) MCG/ACT inhaler Inhale 2 puffs into the lungs every 6 (six) hours as needed for wheezing or shortness of breath. 8 g 0   aspirin 81 MG chewable tablet Chew 81 mg by mouth daily.     cholecalciferol (VITAMIN D) 25 MCG tablet Take 1 tablet (1,000 Units total) by mouth daily. 30 tablet 0   ferrous sulfate 325 (65 FE) MG tablet Take 325 mg by mouth daily with breakfast.     folic acid (FOLVITE) 1 MG tablet Take 1 tablet (1 mg total) by mouth daily. 30 tablet 1   furosemide (LASIX) 40 MG tablet Take 40 mg by mouth daily.      gabapentin (NEURONTIN) 300 MG capsule Take 900 mg by mouth 2 (two) times daily.     hydroxychloroquine (PLAQUENIL) 200 MG tablet Take 200 mg by mouth 2 (two) times daily.     lamoTRIgine (LAMICTAL) 200 MG tablet Take 200 mg by mouth 2 (two) times daily.     lamoTRIgine (LAMICTAL) 25 MG tablet Take 25 mg by mouth 2 (two) times daily.     leflunomide (ARAVA) 10 MG tablet Take 10 mg by mouth daily.     magnesium oxide (MAG-OX) 400 MG tablet Take 400 mg by mouth daily.     omeprazole (PRILOSEC) 40 MG capsule Take 40 mg by mouth daily as needed.      polyethylene glycol (MIRALAX / GLYCOLAX) 17 g packet Take 17 g by mouth daily as needed. 14 each 0   predniSONE (DELTASONE) 5 MG tablet Take 5 mg by mouth daily.     silodosin (RAPAFLO) 8 MG CAPS capsule Take 1 capsule (8 mg total) by mouth daily with breakfast. 30 capsule 11   traZODone (DESYREL) 50 MG tablet Take 50 mg by mouth at bedtime.     guaiFENesin-codeine 100-10 MG/5ML syrup Take 5 mLs by mouth every 6 (six) hours as needed for cough. (Patient not taking: Reported on 08/01/2022) 120 mL 0   metoprolol tartrate (LOPRESSOR) 25 MG tablet Take 1 tablet (25 mg total) by mouth 2 (two) times daily. 60 tablet 11   No current facility-administered medications on file prior to visit.    Allergies  Allergen  Reactions   Cefoperazone Anaphylaxis   Penicillins Anaphylaxis and Other (See Comments)    Has patient had a PCN reaction causing immediate rash, facial/tongue/throat swelling, SOB or lightheadedness with hypotension: Yes Has patient had a PCN reaction causing severe rash involving mucus membranes or skin necrosis: No Has patient had a PCN reaction that required hospitalization No Has patient had a PCN reaction occurring within the last 10 years: No If all of the  above answers are "NO", then may proceed with Cephalosporin use.   Latex Rash       Observations/Objective: There were no vitals filed for this visit. There is no height or weight on file to calculate BMI.  Physical Exam Neurological:     Mental Status: He is alert.     Labs    Latest Ref Rng & Units 07/03/2022    1:19 PM 05/03/2022    6:58 PM 07/04/2021    2:27 PM  CBC  WBC 4.0 - 10.5 K/uL 6.5  6.9  6.3   Hemoglobin 13.0 - 17.0 g/dL 14.5  13.2  13.8   Hematocrit 39.0 - 52.0 % 46.1  42.7  44.3   Platelets 150 - 400 K/uL 213  235  208       Latest Ref Rng & Units 07/03/2022    1:53 PM 05/03/2022    6:58 PM 06/09/2021    7:01 AM  CMP  Glucose 70 - 99 mg/dL 95  123  99   BUN 8 - 23 mg/dL '15  16  20   '$ Creatinine 0.61 - 1.24 mg/dL 0.99  1.24  0.97   Sodium 135 - 145 mmol/L 138  137  138   Potassium 3.5 - 5.1 mmol/L 3.5  4.0  3.7   Chloride 98 - 111 mmol/L 100  102  104   CO2 22 - 32 mmol/L '29  29  26   '$ Calcium 8.9 - 10.3 mg/dL 9.1  8.8  8.4   Total Protein 6.5 - 8.1 g/dL 7.2  7.3  6.4   Total Bilirubin 0.3 - 1.2 mg/dL 0.6  0.7  0.5   Alkaline Phos 38 - 126 U/L 106  97  84   AST 15 - 41 U/L '19  19  20   '$ ALT 0 - 44 U/L '14  14  13       '$ ASSESSMENT & PLAN:   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 Wbc has been stable. He has no constitutional symptoms.  Continue observation.   Orders Placed This Encounter  Procedures   CBC with  Differential ([place] Only)    Standing Status:   Future    Standing Expiration Date:   08/01/2023   CMP ([place] only)    Standing Status:   Future    Standing Expiration Date:   08/01/2023   Lactate dehydrogenase    Standing Status:   Future    Standing Expiration Date:   08/01/2023   Flow cytometry panel-leukemia/lymphoma work-up    Standing Status:   Future    Standing Expiration Date:   08/01/2023   I discussed the assessment and treatment plan with the patient. The patient was provided an opportunity to ask questions and all were answered. The patient agreed with the plan and demonstrated an understanding of the instructions.  The patient was advised to call back or seek an in-person evaluation if the symptoms worsen or if the condition fails to improve as anticipated.   Follow up in 1 year. Patient prefers virtual visit.   All questions were answered. The patient knows to call the clinic with any problems, questions or concerns.  Earlie Server, MD, PhD [facility] Hematology Oncology 08/01/2022

## 2023-01-10 ENCOUNTER — Ambulatory Visit: Payer: Medicare PPO | Admitting: Physician Assistant

## 2023-01-22 ENCOUNTER — Ambulatory Visit: Payer: Medicare PPO | Admitting: Physician Assistant

## 2023-01-24 IMAGING — DX DG CHEST 1V PORT
1 series · 1 of 1 positions shown · non-contrast
Comparison: 09/22/2020

CLINICAL DATA: Sepsis

EXAM:
PORTABLE CHEST 1 VIEW

[chest ap]
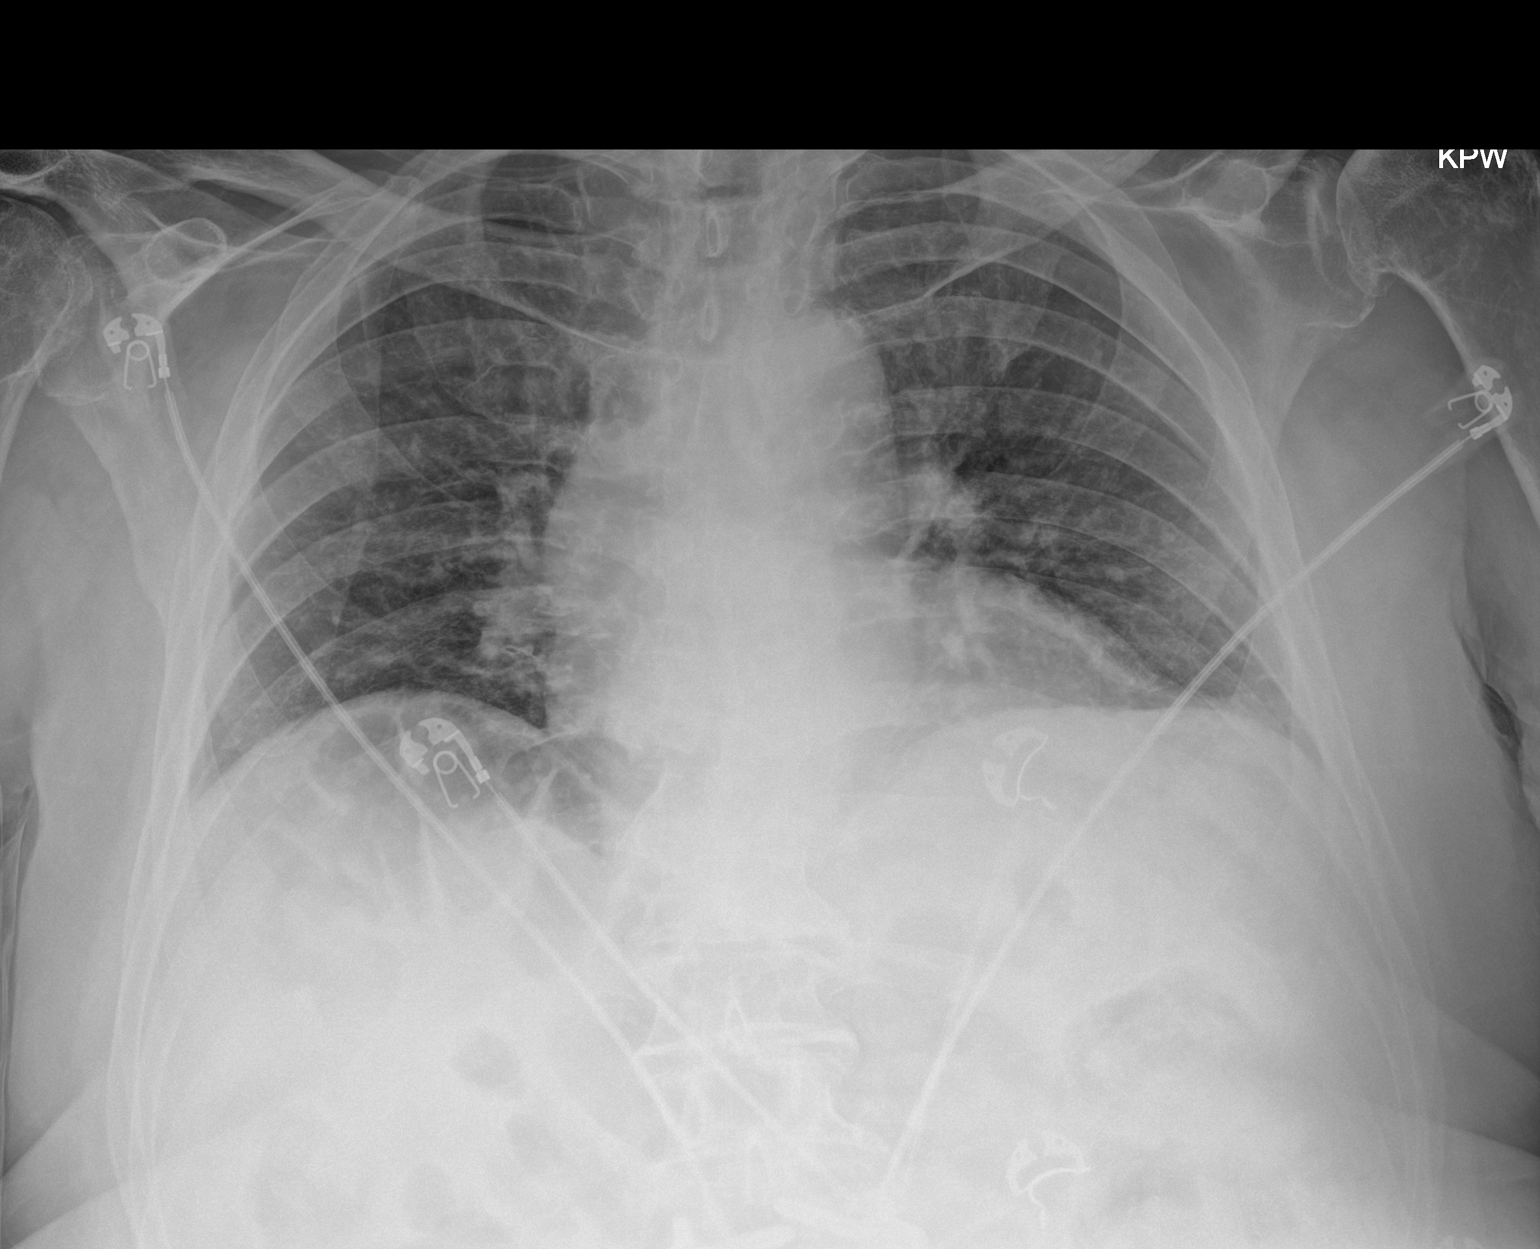

[1 of 1 positions shown; findings below may reference images not displayed]

FINDINGS: Normal cardiac silhouette. Low lung volumes. Mild central venous
congestion. No focal infiltrate. No pneumothorax. Degenerative
changes of the shoulders
IMPRESSION: Low lung volumes and central venous congestion.  No pneumonia.

## 2023-01-25 ENCOUNTER — Other Ambulatory Visit: Payer: Self-pay | Admitting: Physician Assistant

## 2023-01-25 DIAGNOSIS — N401 Enlarged prostate with lower urinary tract symptoms: Secondary | ICD-10-CM

## 2023-01-30 ENCOUNTER — Ambulatory Visit: Payer: Medicare PPO | Admitting: Physician Assistant

## 2023-01-30 ENCOUNTER — Encounter: Payer: Self-pay | Admitting: Physician Assistant

## 2023-01-30 ENCOUNTER — Ambulatory Visit (INDEPENDENT_AMBULATORY_CARE_PROVIDER_SITE_OTHER): Payer: Medicare PPO | Admitting: Physician Assistant

## 2023-01-30 VITALS — BP 117/76 | HR 122

## 2023-01-30 DIAGNOSIS — N401 Enlarged prostate with lower urinary tract symptoms: Secondary | ICD-10-CM

## 2023-01-30 DIAGNOSIS — R350 Frequency of micturition: Secondary | ICD-10-CM

## 2023-01-30 LAB — BLADDER SCAN AMB NON-IMAGING

## 2023-01-30 MED ORDER — GEMTESA 75 MG PO TABS
75.0000 mg | ORAL_TABLET | Freq: Every day | ORAL | Status: DC
Start: 2023-01-30 — End: 2023-07-02

## 2023-01-30 NOTE — Progress Notes (Unsigned)
01/30/2023 5:31 PM   Jeremiah [name] 26-Sep-1940 [id number]  CC: Chief Complaint  Patient presents with   Follow-up    HPI: Shant Renew. is a 82 y.o. male with PMH BPH with LUTS on silodosin who presents today for annual follow-up.  He is accompanied today by his son, who contributes to HPI.  Today he reports nocturia x 2-3 and bothersome daytime urgency and frequency.  He denies dysuria or gross hematuria.  IPSS 19/unhappy as below.  PVR 0 mL.   IPSS     Row Name 01/31/23 1700         International Prostate Symptom Score   How often have you had the sensation of not emptying your bladder? Less than half the time     How often have you had to urinate less than every two hours? More than half the time     How often have you found you stopped and started again several times when you urinated? More than half the time     How often have you found it difficult to postpone urination? Almost always     How often have you had a weak urinary stream? Less than 1 in 5 times     How often have you had to strain to start urination? Not at All     How many times did you typically get up at night to urinate? 3 Times     Total IPSS Score 19       Quality of Life due to urinary symptoms   If you were to spend the rest of your life with your urinary condition just the way it is now how would you feel about that? Unhappy             PMH: Past Medical History:  Diagnosis Date   Allergy    Anemia    Anxiety    Arthritis    CHF (congestive heart failure) (HCC)    Collagen vascular disease (HCC)    Depression    Diabetes mellitus without complication (HCC)    GERD (gastroesophageal reflux disease)    Hypertension    Rheumatoid arthritis (HCC)    Seizures (HCC)    Sepsis (HCC) 05/23/2015    Surgical History: Past Surgical History:  Procedure Laterality Date   BACK SURGERY     back surgery five times  04/2016   CARPAL TUNNEL RELEASE     COLONOSCOPY WITH PROPOFOL N/A  04/27/2020   Procedure: COLONOSCOPY WITH PROPOFOL;  Surgeon: Toledo, Boykin Nearing, MD;  Location: [facility];  Service: Gastroenterology;  Laterality: N/A;   ESOPHAGOGASTRODUODENOSCOPY (EGD) WITH PROPOFOL N/A 04/27/2020   Procedure: ESOPHAGOGASTRODUODENOSCOPY (EGD) WITH PROPOFOL;  Surgeon: Toledo, Boykin Nearing, MD;  Location: [facility];  Service: Gastroenterology;  Laterality: N/A;   HERNIA REPAIR     JOINT REPLACEMENT     knee replacement right     PEG PLACEMENT N/A 04/24/2019   Procedure: PERCUTANEOUS ENDOSCOPIC GASTROSTOMY (PEG) PLACEMENT;  Surgeon: Toledo, Boykin Nearing, MD;  Location: [facility];  Service: Gastroenterology;  Laterality: N/A;   right hip replacement     rotator cuff surgery     SPINE SURGERY      Home Medications:  Allergies as of 01/30/2023       Reactions   Cefoperazone Anaphylaxis   Penicillins Anaphylaxis, Other (See Comments)   Has patient had a PCN reaction causing immediate rash, facial/tongue/throat swelling, SOB or lightheadedness with hypotension: Yes Has patient had a  PCN reaction causing severe rash involving mucus membranes or skin necrosis: No Has patient had a PCN reaction that required hospitalization No Has patient had a PCN reaction occurring within the last 10 years: No If all of the above answers are "NO", then may proceed with Cephalosporin use.   Latex Rash        Medication List        Accurate as of January 30, 2023 11:59 PM. If you have any questions, ask your nurse or doctor.          acetaminophen 325 MG tablet Commonly known as: TYLENOL Take 2 tablets (650 mg total) by mouth every 6 (six) hours as needed for mild pain (or Fever >/= 101).   albuterol 108 (90 Base) MCG/ACT inhaler Commonly known as: VENTOLIN HFA Inhale 2 puffs into the lungs every 6 (six) hours as needed for wheezing or shortness of breath.   aspirin 81 MG chewable tablet Chew 81 mg by mouth daily.   ferrous sulfate 325 (65 FE) MG tablet Take 325 mg  by mouth daily with breakfast.   folic acid 1 MG tablet Commonly known as: FOLVITE Take 1 tablet (1 mg total) by mouth daily.   furosemide 40 MG tablet Commonly known as: LASIX Take 40 mg by mouth daily.   gabapentin 300 MG capsule Commonly known as: NEURONTIN Take 900 mg by mouth 2 (two) times daily.   Gemtesa 75 MG Tabs Generic drug: Vibegron Take 1 tablet (75 mg total) by mouth daily. Started by: Carman Ching   guaiFENesin-codeine 100-10 MG/5ML syrup Take 5 mLs by mouth every 6 (six) hours as needed for cough.   hydroxychloroquine 200 MG tablet Commonly known as: PLAQUENIL Take 200 mg by mouth 2 (two) times daily.   lamoTRIgine 200 MG tablet Commonly known as: LAMICTAL Take 200 mg by mouth 2 (two) times daily.   lamoTRIgine 25 MG tablet Commonly known as: LAMICTAL Take 25 mg by mouth 2 (two) times daily.   leflunomide 10 MG tablet Commonly known as: ARAVA Take 10 mg by mouth daily.   magnesium oxide 400 MG tablet Commonly known as: MAG-OX Take 400 mg by mouth daily.   metoprolol tartrate 25 MG tablet Commonly known as: LOPRESSOR Take 1 tablet (25 mg total) by mouth 2 (two) times daily.   omeprazole 40 MG capsule Commonly known as: PRILOSEC Take 40 mg by mouth daily as needed.   polyethylene glycol 17 g packet Commonly known as: MIRALAX / GLYCOLAX Take 17 g by mouth daily as needed.   predniSONE 5 MG tablet Commonly known as: DELTASONE Take 5 mg by mouth daily.   silodosin 8 MG Caps capsule Commonly known as: RAPAFLO TAKE 1 CAPSULE BY MOUTH DAILY WITH BREAKFAST.   traZODone 50 MG tablet Commonly known as: DESYREL Take 50 mg by mouth at bedtime.   vitamin D3 25 MCG tablet Commonly known as: CHOLECALCIFEROL Take 1 tablet (1,000 Units total) by mouth daily.        Allergies:  Allergies  Allergen Reactions   Cefoperazone Anaphylaxis   Penicillins Anaphylaxis and Other (See Comments)    Has patient had a PCN reaction causing  immediate rash, facial/tongue/throat swelling, SOB or lightheadedness with hypotension: Yes Has patient had a PCN reaction causing severe rash involving mucus membranes or skin necrosis: No Has patient had a PCN reaction that required hospitalization No Has patient had a PCN reaction occurring within the last 10 years: No If all of the above answers are "NO",  then may proceed with Cephalosporin use.   Latex Rash    Family History: Family History  Problem Relation Age of Onset   Hypertension Mother    Dementia Mother    Dementia Father    Hypertension Other    Prostate cancer Neg Hx    Bladder Cancer Neg Hx    Kidney cancer Neg Hx     Social History:   reports that he has quit smoking. His smoking use included cigarettes. He has a 20 pack-year smoking history. He has never used smokeless tobacco. He reports that he does not drink alcohol and does not use drugs.  Physical Exam: BP 117/76   Pulse (!) 122   Constitutional:  Alert and oriented, no acute distress, nontoxic appearing HEENT: [place], AT Cardiovascular: No clubbing, cyanosis.  BLE edema. Respiratory: Normal respiratory effort, no increased work of breathing Skin: No rashes, bruises or suspicious lesions Neurologic: Grossly intact, no focal deficits, moving all 4 extremities Psychiatric: Normal mood and affect  Laboratory Data: Results for orders placed or performed in visit on 01/30/23  Bladder Scan (Post Void Residual) in office  Result Value Ref Range   Scan Result 0ml    Assessment & Plan:   1. Benign prostatic hyperplasia with urinary frequency He is emptying appropriately on silodosin but is having storage related symptoms of frequency and urgency.  I offered him a trial of Gemtesa and he excepted.  Notably, I do think his BLE edema is playing a role in his nocturia.  I advised him to take the Gemtesa at bedtime to see if it will help with his nighttime symptoms. - Bladder Scan (Post Void Residual) in office -  Vibegron (GEMTESA) 75 MG TABS; Take 1 tablet (75 mg total) by mouth daily.   Return in about 6 weeks (around 03/13/2023) for Symptom recheck with PVR, IPSS.  Carman Ching, PA-C  [facility] [place] [address] [place], [place] [address] [phone]

## 2023-03-12 ENCOUNTER — Ambulatory Visit: Payer: Medicare PPO | Admitting: Physician Assistant

## 2023-03-14 ENCOUNTER — Encounter: Payer: Self-pay | Admitting: Physician Assistant

## 2023-05-29 ENCOUNTER — Ambulatory Visit: Payer: Medicare PPO | Admitting: Physician Assistant

## 2023-05-31 ENCOUNTER — Encounter: Payer: Self-pay | Admitting: Physician Assistant

## 2023-07-01 ENCOUNTER — Telehealth: Payer: Self-pay | Admitting: Physician Assistant

## 2023-07-01 DIAGNOSIS — N401 Enlarged prostate with lower urinary tract symptoms: Secondary | ICD-10-CM

## 2023-07-01 NOTE — Telephone Encounter (Signed)
 Patient's wife called to request prescription for Gemtesa . Patient was given samples previously, and they have worked well. Pharmacy is [facility] in [place].

## 2023-07-02 MED ORDER — GEMTESA 75 MG PO TABS: 75.0000 mg | ORAL_TABLET | Freq: Every day | ORAL | 11 refills | Status: DC

## 2023-07-02 NOTE — Telephone Encounter (Signed)
Pt informed, voiced understanding. Appt scheduled.

## 2023-07-02 NOTE — Telephone Encounter (Signed)
Rx sent. Please schedule him for annual follow up with me or Dr. Lonna Cobb with IPSS, PVR in September.

## 2023-07-02 NOTE — Telephone Encounter (Signed)
Jeremiah [name]

## 2023-08-01 ENCOUNTER — Inpatient Hospital Stay: Attending: Oncology

## 2023-08-01 ENCOUNTER — Inpatient Hospital Stay: Payer: Medicare PPO

## 2023-08-01 DIAGNOSIS — Z87891 Personal history of nicotine dependence: Secondary | ICD-10-CM | POA: Insufficient documentation

## 2023-08-01 DIAGNOSIS — D72820 Lymphocytosis (symptomatic): Secondary | ICD-10-CM | POA: Diagnosis present

## 2023-08-01 DIAGNOSIS — D509 Iron deficiency anemia, unspecified: Secondary | ICD-10-CM

## 2023-08-01 LAB — CMP (CANCER CENTER ONLY)
ALT: 15 U/L (ref 0–44)
AST: 20 U/L (ref 15–41)
Albumin: 3.5 g/dL (ref 3.5–5.0)
Alkaline Phosphatase: 96 U/L (ref 38–126)
Anion gap: 8 (ref 5–15)
BUN: 18 mg/dL (ref 8–23)
CO2: 27 mmol/L (ref 22–32)
Calcium: 9.1 mg/dL (ref 8.9–10.3)
Chloride: 104 mmol/L (ref 98–111)
Creatinine: 1.05 mg/dL (ref 0.61–1.24)
GFR, Estimated: >60 mL/min (ref >60)
Glucose, Bld: 108 mg/dL — ABNORMAL HIGH (ref 70–99)
Potassium: 4.1 mmol/L (ref 3.5–5.1)
Sodium: 139 mmol/L (ref 135–145)
Total Bilirubin: 0.6 mg/dL (ref 0.0–1.2)
Total Protein: 6.9 g/dL (ref 6.5–8.1)

## 2023-08-01 LAB — CBC WITH DIFFERENTIAL (CANCER CENTER ONLY)
Abs Immature Granulocytes: 0.02 10*3/uL (ref 0.00–0.07)
Basophils Absolute: 0 10*3/uL (ref 0.0–0.1)
Basophils Relative: 1 %
Eosinophils Absolute: 0.1 10*3/uL (ref 0.0–0.5)
Eosinophils Relative: 2 %
HCT: 44.7 % (ref 39.0–52.0)
Hemoglobin: 14.1 g/dL (ref 13.0–17.0)
Immature Granulocytes: 0 %
Lymphocytes Relative: 15 %
Lymphs Abs: 1 10*3/uL (ref 0.7–4.0)
MCH: 27 pg (ref 26.0–34.0)
MCHC: 31.5 g/dL (ref 30.0–36.0)
MCV: 85.6 fL (ref 80.0–100.0)
Monocytes Absolute: 0.4 10*3/uL (ref 0.1–1.0)
Monocytes Relative: 6 %
Neutro Abs: 5.2 10*3/uL (ref 1.7–7.7)
Neutrophils Relative %: 76 %
Platelet Count: 194 10*3/uL (ref 150–400)
RBC: 5.22 MIL/uL (ref 4.22–5.81)
RDW: 16.1 % — ABNORMAL HIGH (ref 11.5–15.5)
WBC Count: 6.9 10*3/uL (ref 4.0–10.5)
nRBC: 0 % (ref 0.0–0.2)

## 2023-08-01 LAB — LACTATE DEHYDROGENASE: LDH: 129 U/L (ref 98–192)

## 2023-08-05 LAB — COMP PANEL: LEUKEMIA/LYMPHOMA: Immunophenotypic Profile: 1

## 2023-08-08 ENCOUNTER — Inpatient Hospital Stay: Payer: Medicare PPO | Admitting: Oncology

## 2023-08-08 ENCOUNTER — Encounter: Payer: Self-pay | Admitting: Oncology

## 2023-08-08 DIAGNOSIS — D72820 Lymphocytosis (symptomatic): Secondary | ICD-10-CM | POA: Diagnosis not present

## 2023-08-08 NOTE — Progress Notes (Signed)
 HEMATOLOGY-ONCOLOGY TeleHEALTH VISIT PROGRESS NOTE  I connected with Jeremiah [name]. on 08/08/23  at  2:45 PM EDT by video enabled telemedicine visit and verified that I am speaking with the correct person using two identifiers. I discussed the limitations, risks, security and privacy concerns of performing an evaluation and management service by telemedicine and the availability of in-person appointments. The patient expressed understanding and agreed to proceed.   Other persons participating in the visit and their role in the encounter:  None  Patient's location: Home  Provider's location: office Chief Complaint: # Monoclonal B-cell lymphocytosis    INTERVAL HISTORY Jeremiah Marzella. is a 83 y.o. male who has above history reviewed by me today presents virtually for follow up visit for management of # Monoclonal B-cell lymphocytosis Denies weight loss, fever, chills, fatigue, night sweats.    Review of Systems  Constitutional:  Negative for appetite change, fatigue and unexpected weight change.  Respiratory:  Negative for shortness of breath.   Cardiovascular:  Negative for chest pain.  Gastrointestinal:  Negative for abdominal pain.  Genitourinary:  Negative for difficulty urinating.   Hematological:  Negative for adenopathy.  Psychiatric/Behavioral:  Negative for confusion.     Past Medical History:  Diagnosis Date   Allergy    Anemia    Anxiety    Arthritis    CHF (congestive heart failure) (HCC)    Collagen vascular disease (HCC)    Depression    Diabetes mellitus without complication (HCC)    GERD (gastroesophageal reflux disease)    Hypertension    Rheumatoid arthritis (HCC)    Seizures (HCC)    Sepsis (HCC) 05/23/2015   Past Surgical History:  Procedure Laterality Date   BACK SURGERY     back surgery five times  04/2016   CARPAL TUNNEL RELEASE     COLONOSCOPY WITH PROPOFOL N/A 04/27/2020   Procedure: COLONOSCOPY WITH PROPOFOL;  Surgeon: Toledo, Boykin Nearing, MD;   Location: [facility];  Service: Gastroenterology;  Laterality: N/A;   ESOPHAGOGASTRODUODENOSCOPY (EGD) WITH PROPOFOL N/A 04/27/2020   Procedure: ESOPHAGOGASTRODUODENOSCOPY (EGD) WITH PROPOFOL;  Surgeon: Toledo, Boykin Nearing, MD;  Location: [facility];  Service: Gastroenterology;  Laterality: N/A;   HERNIA REPAIR     JOINT REPLACEMENT     knee replacement right     PEG PLACEMENT N/A 04/24/2019   Procedure: PERCUTANEOUS ENDOSCOPIC GASTROSTOMY (PEG) PLACEMENT;  Surgeon: Toledo, Boykin Nearing, MD;  Location: [facility];  Service: Gastroenterology;  Laterality: N/A;   right hip replacement     rotator cuff surgery     SPINE SURGERY      Family History  Problem Relation Age of Onset   Hypertension Mother    Dementia Mother    Dementia Father    Hypertension Other    Prostate cancer Neg Hx    Bladder Cancer Neg Hx    Kidney cancer Neg Hx     Social History   Socioeconomic History   Marital status: Married    Spouse name: Not on file   Number of children: Not on file   Years of education: Not on file   Highest education level: Not on file  Occupational History   Not on file  Tobacco Use   Smoking status: Former    Current packs/day: 1.00    Average packs/day: 1 pack/day for 20.0 years (20.0 ttl pk-yrs)    Types: Cigarettes   Smokeless tobacco: Never  Vaping Use   Vaping status: Never Used  Substance and  Sexual Activity   Alcohol use: No   Drug use: Never   Sexual activity: Not on file  Other Topics Concern   Not on file  Social History Narrative   Not on file   Social Drivers of Health   Financial Resource Strain: Patient Declined (09/14/2022)   Received from [facility]   Overall Financial Resource Strain (CARDIA)    Difficulty of Paying Living Expenses: Patient declined  Food Insecurity: Patient Declined (09/14/2022)   Received from [facility]   Hunger Vital Sign    Worried About Running Out of Food in the Last Year: Patient  declined    Ran Out of Food in the Last Year: Patient declined  Transportation Needs: Patient Declined (09/14/2022)   Received from [facility]   PRAPARE - Transportation    In the past 12 months, has lack of transportation kept you from medical appointments or from getting medications?: Patient declined    Lack of Transportation (Non-Medical): Patient declined  Physical Activity: Not on file  Stress: Not on file  Social Connections: Not on file  Intimate Partner Violence: Not on file    Current Outpatient Medications on File Prior to Visit  Medication Sig Dispense Refill   acetaminophen (TYLENOL) 325 MG tablet Take 2 tablets (650 mg total) by mouth every 6 (six) hours as needed for mild pain (or Fever >/= 101).     albuterol (VENTOLIN HFA) 108 (90 Base) MCG/ACT inhaler Inhale 2 puffs into the lungs every 6 (six) hours as needed for wheezing or shortness of breath. 8 g 0   aspirin 81 MG chewable tablet Chew 81 mg by mouth daily.     cholecalciferol (VITAMIN D) 25 MCG tablet Take 1 tablet (1,000 Units total) by mouth daily. 30 tablet 0   ferrous sulfate 325 (65 FE) MG tablet Take 325 mg by mouth daily with breakfast.     hydroxychloroquine (PLAQUENIL) 200 MG tablet Take 200 mg by mouth 2 (two) times daily.     lamoTRIgine (LAMICTAL) 200 MG tablet Take 200 mg by mouth 2 (two) times daily.     lamoTRIgine (LAMICTAL) 25 MG tablet Take 25 mg by mouth 2 (two) times daily.     magnesium oxide (MAG-OX) 400 MG tablet Take 400 mg by mouth daily.     omeprazole (PRILOSEC) 40 MG capsule Take 40 mg by mouth daily as needed.      predniSONE (DELTASONE) 5 MG tablet Take 5 mg by mouth daily.     silodosin (RAPAFLO) 8 MG CAPS capsule TAKE 1 CAPSULE BY MOUTH DAILY WITH BREAKFAST. 90 capsule 3   traZODone (DESYREL) 50 MG tablet Take 50 mg by mouth at bedtime.     Vibegron (GEMTESA) 75 MG TABS Take 1 tablet (75 mg total) by mouth daily. 30 tablet 11   folic acid (FOLVITE) 1 MG tablet Take 1  tablet (1 mg total) by mouth daily. (Patient not taking: Reported on 08/08/2023) 30 tablet 1   furosemide (LASIX) 40 MG tablet Take 40 mg by mouth daily.  (Patient not taking: Reported on 08/08/2023)     gabapentin (NEURONTIN) 300 MG capsule Take 900 mg by mouth 2 (two) times daily. (Patient not taking: Reported on 08/08/2023)     guaiFENesin-codeine 100-10 MG/5ML syrup Take 5 mLs by mouth every 6 (six) hours as needed for cough. (Patient not taking: Reported on 08/08/2023) 120 mL 0   leflunomide (ARAVA) 10 MG tablet Take 10 mg by mouth  daily. (Patient not taking: Reported on 08/08/2023)     metoprolol tartrate (LOPRESSOR) 25 MG tablet Take 1 tablet (25 mg total) by mouth 2 (two) times daily. 60 tablet 11   polyethylene glycol (MIRALAX / GLYCOLAX) 17 g packet Take 17 g by mouth daily as needed. (Patient not taking: Reported on 08/08/2023) 14 each 0   No current facility-administered medications on file prior to visit.    Allergies  Allergen Reactions   Cefoperazone Anaphylaxis   Penicillins Anaphylaxis and Other (See Comments)    Has patient had a PCN reaction causing immediate rash, facial/tongue/throat swelling, SOB or lightheadedness with hypotension: Yes Has patient had a PCN reaction causing severe rash involving mucus membranes or skin necrosis: No Has patient had a PCN reaction that required hospitalization No Has patient had a PCN reaction occurring within the last 10 years: No If all of the above answers are "NO", then may proceed with Cephalosporin use.   Latex Rash       Observations/Objective: There were no vitals filed for this visit. There is no height or weight on file to calculate BMI.  Physical Exam Neurological:     Mental Status: He is alert.     Labs    Latest Ref Rng & Units 08/01/2023    3:34 PM 07/03/2022    1:19 PM 05/03/2022    6:58 PM  CBC  WBC 4.0 - 10.5 K/uL 6.9  6.5  6.9   Hemoglobin 13.0 - 17.0 g/dL 40.9  81.1  91.4   Hematocrit 39.0 - 52.0 % 44.7  46.1   42.7   Platelets 150 - 400 K/uL 194  213  235       Latest Ref Rng & Units 08/01/2023    3:34 PM 07/03/2022    1:53 PM 05/03/2022    6:58 PM  CMP  Glucose 70 - 99 mg/dL 782  95  956   BUN 8 - 23 mg/dL 18  15  16    Creatinine 0.61 - 1.24 mg/dL 2.13  0.86  5.78   Sodium 135 - 145 mmol/L 139  138  137   Potassium 3.5 - 5.1 mmol/L 4.1  3.5  4.0   Chloride 98 - 111 mmol/L 104  100  102   CO2 22 - 32 mmol/L 27  29  29    Calcium 8.9 - 10.3 mg/dL 9.1  9.1  8.8   Total Protein 6.5 - 8.1 g/dL 6.9  7.2  7.3   Total Bilirubin 0.0 - 1.2 mg/dL 0.6  0.6  0.7   Alkaline Phos 38 - 126 U/L 96  106  97   AST 15 - 41 U/L 20  19  19    ALT 0 - 44 U/L 15  14  14        ASSESSMENT & PLAN:   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 Wbc has been stable. He has no constitutional symptoms.  Continue observation.   Orders Placed This Encounter  Procedures   CBC with Differential (Cancer Center Only)    Standing Status:   Future    Expected Date:   08/07/2024    Expiration Date:   08/07/2024   CMP (Cancer Center only)    Standing Status:   Future    Expected Date:   08/07/2024    Expiration Date:   08/07/2024   Flow cytometry panel-leukemia/lymphoma work-up    Standing Status:   Future    Expected  Date:   08/07/2024    Expiration Date:   08/07/2024   I discussed the assessment and treatment plan with the patient. The patient was provided an opportunity to ask questions and all were answered. The patient agreed with the plan and demonstrated an understanding of the instructions. The patient was advised to call back or seek an in-person evaluation if the symptoms worsen or if the condition fails to improve as anticipated.   Follow up in 1 year. Patient prefers virtual visit.   All questions were answered. The patient knows to call the clinic with any problems, questions or concerns.  Rickard Patience, MD, PhD [facility] Hematology  Oncology 08/08/2023

## 2023-08-08 NOTE — Assessment & Plan Note (Signed)
#   Monoclonal B-cell lymphocytosis Labs are reviewed and discussed with patient. Wbc has been stable. He has no constitutional symptoms.  Continue observation.

## 2024-01-28 ENCOUNTER — Ambulatory Visit: Payer: Medicare PPO | Admitting: Physician Assistant

## 2024-02-18 ENCOUNTER — Ambulatory Visit (INDEPENDENT_AMBULATORY_CARE_PROVIDER_SITE_OTHER): Admitting: Physician Assistant

## 2024-02-18 VITALS — BP 104/66 | HR 107 | Ht 72.0 in | Wt 260.0 lb

## 2024-02-18 DIAGNOSIS — N401 Enlarged prostate with lower urinary tract symptoms: Secondary | ICD-10-CM | POA: Diagnosis not present

## 2024-02-18 DIAGNOSIS — R350 Frequency of micturition: Secondary | ICD-10-CM

## 2024-02-18 DIAGNOSIS — R351 Nocturia: Secondary | ICD-10-CM | POA: Diagnosis not present

## 2024-02-18 LAB — BLADDER SCAN AMB NON-IMAGING

## 2024-02-18 MED ORDER — MIRABEGRON ER 50 MG PO TB24: 50.0000 mg | ORAL_TABLET | Freq: Every day | ORAL | 3 refills | Status: DC

## 2024-02-18 MED ORDER — FINASTERIDE 5 MG PO TABS: 5.0000 mg | ORAL_TABLET | Freq: Every day | ORAL | 3 refills | Status: AC

## 2024-02-18 MED ORDER — SILODOSIN 8 MG PO CAPS: 8.0000 mg | ORAL_CAPSULE | Freq: Every day | ORAL | 3 refills | Status: AC

## 2024-02-18 MED ORDER — MIRABEGRON ER 50 MG PO TB24: 50.0000 mg | ORAL_TABLET | Freq: Every day | ORAL | 3 refills | Status: AC

## 2024-02-18 NOTE — Progress Notes (Signed)
 02/18/2024 3:28 PM   Jeremiah [name] Jeremiah [name] 1940-12-31 [id number]  CC: Chief Complaint  Patient presents with   Follow-up   HPI: Jeremiah [name]. is a 83 y.o. male with PMH BPH with nocturia on silodosin  and Gemtesa  who presents today for annual follow-up.  He is accompanied today by his son, who contributes to HPI.  Today they report he stopped Gemtesa  due to cost but remains on silodosin .  He remains primarily bothered by his nocturia x 3-4.  IPSS 24/terrible, previously 19/unhappy. PVR 12mL.   IPSS     Row Name 02/18/24 1500         International Prostate Symptom Score   How often have you had the sensation of not emptying your bladder? About half the time     How often have you had to urinate less than every two hours? More than half the time     How often have you found you stopped and started again several times when you urinated? More than half the time     How often have you found it difficult to postpone urination? More than half the time     How often have you had a weak urinary stream? More than half the time     How often have you had to strain to start urination? Less than 1 in 5 times     How many times did you typically get up at night to urinate? 4 Times     Total IPSS Score 24       Quality of Life due to urinary symptoms   If you were to spend the rest of your life with your urinary condition just the way it is now how would you feel about that? Terrible         PMH: Past Medical History:  Diagnosis Date   Allergy    Anemia    Anxiety    Arthritis    CHF (congestive heart failure) (HCC)    Collagen vascular disease    Depression    Diabetes mellitus without complication (HCC)    GERD (gastroesophageal reflux disease)    Hypertension    Rheumatoid arthritis (HCC)    Seizures (HCC)    Sepsis (HCC) 05/23/2015    Surgical History: Past Surgical History:  Procedure Laterality Date   BACK SURGERY     back surgery five times  04/2016   CARPAL  TUNNEL RELEASE     COLONOSCOPY WITH PROPOFOL  N/A 04/27/2020   Procedure: COLONOSCOPY WITH PROPOFOL ;  Surgeon: Toledo, Ladell POUR, MD;  Location: [facility];  Service: Gastroenterology;  Laterality: N/A;   ESOPHAGOGASTRODUODENOSCOPY (EGD) WITH PROPOFOL  N/A 04/27/2020   Procedure: ESOPHAGOGASTRODUODENOSCOPY (EGD) WITH PROPOFOL ;  Surgeon: Toledo, Ladell POUR, MD;  Location: [facility];  Service: Gastroenterology;  Laterality: N/A;   HERNIA REPAIR     JOINT REPLACEMENT     knee replacement right     PEG PLACEMENT N/A 04/24/2019   Procedure: PERCUTANEOUS ENDOSCOPIC GASTROSTOMY (PEG) PLACEMENT;  Surgeon: Toledo, Ladell POUR, MD;  Location: [facility];  Service: Gastroenterology;  Laterality: N/A;   right hip replacement     rotator cuff surgery     SPINE SURGERY      Home Medications:  Allergies as of 02/18/2024       Reactions   Cefoperazone Anaphylaxis   Penicillins Anaphylaxis, Other (See Comments)   Has patient had a PCN reaction causing immediate rash, facial/tongue/throat swelling, SOB or lightheadedness with hypotension: Yes Has patient  had a PCN reaction causing severe rash involving mucus membranes or skin necrosis: No Has patient had a PCN reaction that required hospitalization No Has patient had a PCN reaction occurring within the last 10 years: No If all of the above answers are NO, then may proceed with Cephalosporin use.   Latex Rash        Medication List        Accurate as of February 18, 2024  3:28 PM. If you have any questions, ask your nurse or doctor.          acetaminophen  325 MG tablet Commonly known as: TYLENOL  Take 2 tablets (650 mg total) by mouth every 6 (six) hours as needed for mild pain (or Fever >/= 101).   albuterol  108 (90 Base) MCG/ACT inhaler Commonly known as: VENTOLIN  HFA Inhale 2 puffs into the lungs every 6 (six) hours as needed for wheezing or shortness of breath.   aspirin  81 MG chewable tablet Chew 81 mg by mouth daily.    ferrous sulfate  325 (65 FE) MG tablet Take 325 mg by mouth daily with breakfast.   folic acid  1 MG tablet Commonly known as: FOLVITE  Take 1 tablet (1 mg total) by mouth daily.   furosemide  40 MG tablet Commonly known as: LASIX  Take 40 mg by mouth daily.   gabapentin  300 MG capsule Commonly known as: NEURONTIN  Take 900 mg by mouth 2 (two) times daily.   Gemtesa  75 MG Tabs Generic drug: Vibegron  Take 1 tablet (75 mg total) by mouth daily.   guaiFENesin -codeine  100-10 MG/5ML syrup Take 5 mLs by mouth every 6 (six) hours as needed for cough.   hydroxychloroquine  200 MG tablet Commonly known as: PLAQUENIL  Take 200 mg by mouth 2 (two) times daily.   lamoTRIgine  200 MG tablet Commonly known as: LAMICTAL  Take 200 mg by mouth 2 (two) times daily.   lamoTRIgine  25 MG tablet Commonly known as: LAMICTAL  Take 25 mg by mouth 2 (two) times daily.   leflunomide  10 MG tablet Commonly known as: ARAVA  Take 10 mg by mouth daily.   magnesium  oxide 400 MG tablet Commonly known as: MAG-OX Take 400 mg by mouth daily.   metoprolol  tartrate 25 MG tablet Commonly known as: LOPRESSOR  Take 1 tablet (25 mg total) by mouth 2 (two) times daily.   omeprazole 40 MG capsule Commonly known as: PRILOSEC Take 40 mg by mouth daily as needed.   polyethylene glycol 17 g packet Commonly known as: MIRALAX  / GLYCOLAX  Take 17 g by mouth daily as needed.   predniSONE  5 MG tablet Commonly known as: DELTASONE  Take 5 mg by mouth daily.   silodosin  8 MG Caps capsule Commonly known as: RAPAFLO  TAKE 1 CAPSULE BY MOUTH DAILY WITH BREAKFAST.   traZODone  50 MG tablet Commonly known as: DESYREL  Take 50 mg by mouth at bedtime.   vitamin D3 25 MCG tablet Commonly known as: CHOLECALCIFEROL  Take 1 tablet (1,000 Units total) by mouth daily.        Allergies:  Allergies  Allergen Reactions   Cefoperazone Anaphylaxis   Penicillins Anaphylaxis and Other (See Comments)    Has patient had a PCN  reaction causing immediate rash, facial/tongue/throat swelling, SOB or lightheadedness with hypotension: Yes Has patient had a PCN reaction causing severe rash involving mucus membranes or skin necrosis: No Has patient had a PCN reaction that required hospitalization No Has patient had a PCN reaction occurring within the last 10 years: No If all of the above answers are NO, then  may proceed with Cephalosporin use.   Latex Rash    Family History: Family History  Problem Relation Age of Onset   Hypertension Mother    Dementia Mother    Dementia Father    Hypertension Other    Prostate cancer Neg Hx    Bladder Cancer Neg Hx    Kidney cancer Neg Hx     Social History:   reports that he has quit smoking. His smoking use included cigarettes. He has a 20 pack-year smoking history. He has never used smokeless tobacco. He reports that he does not drink alcohol  and does not use drugs.  Physical Exam: BP 104/66 (BP Location: Left Arm, Patient Position: Sitting, Cuff Size: Large)   Pulse (!) 107   Ht 6' (1.829 m)   Wt 260 lb (117.9 kg)   SpO2 93%   BMI 35.26 kg/m   Constitutional:  Alert and oriented, no acute distress, nontoxic appearing HEENT: [place], AT Cardiovascular: No clubbing, cyanosis, or edema Respiratory: Normal respiratory effort, no increased work of breathing Skin: No rashes, bruises or suspicious lesions Neurologic: Grossly intact, no focal deficits, moving all 4 extremities Psychiatric: Normal mood and affect  Laboratory Data: Results for orders placed or performed in visit on 02/18/24  Bladder Scan (Post Void Residual) in office   Collection Time: 02/18/24  3:27 PM  Result Value Ref Range   Scan Result 12ml    Assessment & Plan:   1. Benign prostatic hyperplasia with nocturia (Primary) Emptying appropriately.  He is primarily bothered by nocturia, though on IPSS reports significant obstructive symptoms as well.  Will continue silodosin  and augment with finasteride  as well as nighttime Myrbetriq to help with nocturia.  We also discussed consideration of outlet procedures, but I recommended against this if his primary goal is to address the nocturia.  I explained that nocturia is often multifactorial and I cannot guarantee a satisfactory outcome of this symptom with the procedure. - Bladder Scan (Post Void Residual) in office - silodosin  (RAPAFLO ) 8 MG CAPS capsule; Take 1 capsule (8 mg total) by mouth daily with breakfast.  Dispense: 90 capsule; Refill: 3 - finasteride (PROSCAR) 5 MG tablet; Take 1 tablet (5 mg total) by mouth daily.  Dispense: 90 tablet; Refill: 3 - mirabegron ER (MYRBETRIQ) 50 MG TB24 tablet; Take 1 tablet (50 mg total) by mouth at bedtime.  Dispense: 90 tablet; Refill: 3   Return in about 1 year (around 02/17/2025) for Annual IPSS/PVR.  Lucie Hones, PA-C  [facility] [place] [address], [address] [place], [place] [address] [phone]

## 2024-02-18 NOTE — Patient Instructions (Addendum)
 CONTINUE silodosin  for enlarged prostate START finasteride for enlarged prostate START mirabegron for nighttime urination. Take this in the evening. If it is too expensive, ask the pharmacy to put it back and please call our office and let us  know so we can come up with another plan.

## 2024-03-05 ENCOUNTER — Telehealth: Payer: Self-pay | Admitting: Physician Assistant

## 2024-03-05 NOTE — Telephone Encounter (Signed)
 Patient's wife Isidoro) called and stated that he was given prescription for Myrbetriq, but it is too expensive for them. She said that the PA from the [facility] that comes out to their house told them the [facility] can get a generic that would be cheaper for them. Patient wants to know what Sam's thoughts are about this.

## 2024-03-09 ENCOUNTER — Other Ambulatory Visit: Payer: Self-pay

## 2024-03-09 NOTE — Telephone Encounter (Signed)
 If the generic they're referring to is mirabegron, then that's fine with me.

## 2024-03-09 NOTE — Telephone Encounter (Signed)
 Spoke with patient and patient stated he was going to try to get medications switched to to the [facility] in [place]. If he has any trouble he would reach back out to the clinic and make us  aware.

## 2024-04-30 ENCOUNTER — Telehealth: Payer: Self-pay

## 2024-04-30 ENCOUNTER — Telehealth: Payer: Self-pay | Admitting: Physician Assistant

## 2024-04-30 NOTE — Telephone Encounter (Signed)
 Spoke with patient and informed him per his previous visit with Sam, she sent in 90 day supply plus 3 additional refills for each medication. Advised patient to reach out to the pharmacy and request his medication be refilled. Patient verbalized understanding.

## 2024-04-30 NOTE — Telephone Encounter (Signed)
 Pt called into office and said that he need new prescriptions  of finasteride  (PROSCAR ) 5 MG tablet and mirabegron  ER (MYRBETRIQ ) 50 MG sent into [facility] in [place].

## 2024-04-30 NOTE — Telephone Encounter (Signed)
 See separate telephone encounter.

## 2024-07-30 ENCOUNTER — Other Ambulatory Visit

## 2024-08-06 ENCOUNTER — Telehealth: Admitting: Oncology

## 2025-02-17 ENCOUNTER — Ambulatory Visit: Admitting: Physician Assistant
